# Patient Record
Sex: Male | Born: 1970 | Race: White | Hispanic: No | Marital: Single | Smoking: Never smoker
Health system: Southern US, Community
[De-identification: ages and names within clinical notes are randomized; demographics above are authoritative.]

## PROBLEM LIST (undated history)

## (undated) ENCOUNTER — Ambulatory Visit: Admission: EM | Source: Home / Self Care

## (undated) DIAGNOSIS — Z8719 Personal history of other diseases of the digestive system: Secondary | ICD-10-CM

## (undated) DIAGNOSIS — G8254 Quadriplegia, C5-C7 incomplete: Secondary | ICD-10-CM

## (undated) DIAGNOSIS — I509 Heart failure, unspecified: Secondary | ICD-10-CM

## (undated) DIAGNOSIS — I959 Hypotension, unspecified: Secondary | ICD-10-CM

## (undated) DIAGNOSIS — A499 Bacterial infection, unspecified: Secondary | ICD-10-CM

## (undated) DIAGNOSIS — M6530 Trigger finger, unspecified finger: Secondary | ICD-10-CM

## (undated) DIAGNOSIS — E109 Type 1 diabetes mellitus without complications: Secondary | ICD-10-CM

## (undated) DIAGNOSIS — M1990 Unspecified osteoarthritis, unspecified site: Secondary | ICD-10-CM

## (undated) DIAGNOSIS — Z87440 Personal history of urinary (tract) infections: Secondary | ICD-10-CM

## (undated) DIAGNOSIS — G8929 Other chronic pain: Secondary | ICD-10-CM

## (undated) DIAGNOSIS — Z87442 Personal history of urinary calculi: Secondary | ICD-10-CM

## (undated) DIAGNOSIS — K219 Gastro-esophageal reflux disease without esophagitis: Secondary | ICD-10-CM

## (undated) DIAGNOSIS — N200 Calculus of kidney: Secondary | ICD-10-CM

## (undated) DIAGNOSIS — K603 Anal fistula, unspecified: Secondary | ICD-10-CM

## (undated) DIAGNOSIS — R32 Unspecified urinary incontinence: Secondary | ICD-10-CM

## (undated) DIAGNOSIS — F32A Depression, unspecified: Secondary | ICD-10-CM

## (undated) DIAGNOSIS — F329 Major depressive disorder, single episode, unspecified: Secondary | ICD-10-CM

## (undated) DIAGNOSIS — R55 Syncope and collapse: Secondary | ICD-10-CM

## (undated) DIAGNOSIS — G904 Autonomic dysreflexia: Secondary | ICD-10-CM

## (undated) DIAGNOSIS — N390 Urinary tract infection, site not specified: Secondary | ICD-10-CM

## (undated) HISTORY — DX: Urinary tract infection, site not specified: N39.0

## (undated) HISTORY — DX: Quadriplegia, C5-C7 incomplete: G82.54

## (undated) HISTORY — DX: Depression, unspecified: F32.A

## (undated) HISTORY — DX: Type 1 diabetes mellitus without complications: E10.9

## (undated) HISTORY — DX: Major depressive disorder, single episode, unspecified: F32.9

## (undated) HISTORY — DX: Syncope and collapse: R55

## (undated) HISTORY — PX: KNEE SURGERY: SHX244

## (undated) HISTORY — DX: Personal history of urinary (tract) infections: Z87.440

## (undated) HISTORY — PX: BLADDER SURGERY: SHX569

## (undated) HISTORY — DX: Calculus of kidney: N20.0

## (undated) HISTORY — DX: Other chronic pain: G89.29

## (undated) HISTORY — PX: OTHER SURGICAL HISTORY: SHX169

## (undated) HISTORY — DX: Anal fistula: K60.3

## (undated) HISTORY — DX: Anal fistula, unspecified: K60.30

## (undated) HISTORY — DX: Bacterial infection, unspecified: A49.9

## (undated) HISTORY — DX: Unspecified urinary incontinence: R32

---
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
## 1999-12-08 HISTORY — PX: POPLITEAL SYNOVIAL CYST EXCISION: SUR555

## 2004-11-18 ENCOUNTER — Ambulatory Visit: Payer: Self-pay | Admitting: Pain Medicine

## 2005-02-12 ENCOUNTER — Ambulatory Visit: Payer: Self-pay | Admitting: Pain Medicine

## 2005-02-18 ENCOUNTER — Ambulatory Visit: Payer: Self-pay | Admitting: Pain Medicine

## 2005-03-16 ENCOUNTER — Ambulatory Visit: Payer: Self-pay | Admitting: Physician Assistant

## 2005-05-07 ENCOUNTER — Ambulatory Visit: Payer: Self-pay | Admitting: Pain Medicine

## 2005-07-21 ENCOUNTER — Ambulatory Visit: Payer: Self-pay | Admitting: Pain Medicine

## 2005-08-12 ENCOUNTER — Ambulatory Visit: Payer: Self-pay | Admitting: Urology

## 2007-10-24 ENCOUNTER — Encounter: Payer: Self-pay | Admitting: Internal Medicine

## 2007-11-07 ENCOUNTER — Encounter: Payer: Self-pay | Admitting: Internal Medicine

## 2007-12-08 ENCOUNTER — Encounter: Payer: Self-pay | Admitting: Internal Medicine

## 2008-04-20 ENCOUNTER — Ambulatory Visit: Payer: Self-pay | Admitting: Unknown Physician Specialty

## 2008-05-11 ENCOUNTER — Encounter: Payer: Self-pay | Admitting: Internal Medicine

## 2008-06-06 ENCOUNTER — Encounter: Payer: Self-pay | Admitting: Internal Medicine

## 2008-12-11 ENCOUNTER — Ambulatory Visit: Payer: Self-pay | Admitting: Internal Medicine

## 2008-12-31 ENCOUNTER — Encounter: Payer: Self-pay | Admitting: Internal Medicine

## 2009-01-07 ENCOUNTER — Encounter: Payer: Self-pay | Admitting: Internal Medicine

## 2009-01-10 ENCOUNTER — Ambulatory Visit: Payer: Self-pay | Admitting: Urology

## 2009-02-04 ENCOUNTER — Encounter: Payer: Self-pay | Admitting: Internal Medicine

## 2009-03-06 ENCOUNTER — Encounter: Payer: Self-pay | Admitting: Internal Medicine

## 2009-03-07 ENCOUNTER — Encounter: Payer: Self-pay | Admitting: Internal Medicine

## 2009-06-13 ENCOUNTER — Ambulatory Visit: Payer: Self-pay | Admitting: Urology

## 2010-05-16 ENCOUNTER — Ambulatory Visit: Payer: Self-pay | Admitting: Unknown Physician Specialty

## 2010-06-05 ENCOUNTER — Ambulatory Visit: Payer: Self-pay | Admitting: Urology

## 2010-07-24 ENCOUNTER — Ambulatory Visit: Payer: Self-pay | Admitting: Urology

## 2010-10-27 ENCOUNTER — Ambulatory Visit: Payer: Self-pay | Admitting: Internal Medicine

## 2011-05-25 ENCOUNTER — Ambulatory Visit: Payer: Self-pay | Admitting: Internal Medicine

## 2011-06-01 ENCOUNTER — Ambulatory Visit: Payer: Self-pay | Admitting: Internal Medicine

## 2011-06-07 ENCOUNTER — Ambulatory Visit: Payer: Self-pay | Admitting: Internal Medicine

## 2012-01-25 ENCOUNTER — Ambulatory Visit: Payer: Self-pay | Admitting: Internal Medicine

## 2012-02-09 ENCOUNTER — Ambulatory Visit: Payer: Self-pay | Admitting: Internal Medicine

## 2012-04-05 ENCOUNTER — Ambulatory Visit: Payer: Self-pay | Admitting: Urology

## 2012-09-26 ENCOUNTER — Other Ambulatory Visit: Payer: Self-pay | Admitting: *Deleted

## 2012-09-26 NOTE — Telephone Encounter (Signed)
R'cd fax from [place] for refill of Lantus-sig: 16 units subcutaneously at bedtime-please advise on refill, pt has appointment with you on 11/24/2012 at 4:00pm.

## 2012-09-27 NOTE — Telephone Encounter (Signed)
Ok to refill x 3 (with directions the way he has been taking).  Thanks.

## 2012-09-28 ENCOUNTER — Other Ambulatory Visit: Payer: Self-pay | Admitting: *Deleted

## 2012-09-30 NOTE — Telephone Encounter (Signed)
Patients states that he has already gotten his rx refilled and that he is good until his next appt.

## 2012-11-24 ENCOUNTER — Encounter: Payer: Self-pay | Admitting: Internal Medicine

## 2012-11-24 ENCOUNTER — Ambulatory Visit (INDEPENDENT_AMBULATORY_CARE_PROVIDER_SITE_OTHER): Payer: Commercial Indemnity | Admitting: Internal Medicine

## 2012-11-24 VITALS — BP 128/68 | HR 63 | Temp 98.6°F

## 2012-11-24 DIAGNOSIS — R5381 Other malaise: Secondary | ICD-10-CM

## 2012-11-24 DIAGNOSIS — R55 Syncope and collapse: Secondary | ICD-10-CM

## 2012-11-24 DIAGNOSIS — R532 Functional quadriplegia: Secondary | ICD-10-CM | POA: Insufficient documentation

## 2012-11-24 DIAGNOSIS — R5383 Other fatigue: Secondary | ICD-10-CM

## 2012-11-24 DIAGNOSIS — E109 Type 1 diabetes mellitus without complications: Secondary | ICD-10-CM | POA: Insufficient documentation

## 2012-11-24 DIAGNOSIS — G8220 Paraplegia, unspecified: Secondary | ICD-10-CM

## 2012-11-24 DIAGNOSIS — Z87440 Personal history of urinary (tract) infections: Secondary | ICD-10-CM | POA: Insufficient documentation

## 2012-11-24 DIAGNOSIS — I959 Hypotension, unspecified: Secondary | ICD-10-CM

## 2012-11-27 ENCOUNTER — Encounter: Payer: Self-pay | Admitting: Internal Medicine

## 2012-11-27 DIAGNOSIS — I959 Hypotension, unspecified: Secondary | ICD-10-CM | POA: Insufficient documentation
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
## 2012-11-27 NOTE — Assessment & Plan Note (Signed)
He adjust his own insulin.  Takes 18 units of Lantus in the evening.  Uses Humalog with meals.  Check met b and a1c.

## 2012-11-27 NOTE — Assessment & Plan Note (Signed)
Currently asymptomatic.  Continues follow up with Dr Yves Dill.

## 2012-11-27 NOTE — Assessment & Plan Note (Signed)
Having episodes of decreased blood pressure as outlined.  Had a syncopal episode while going up his ramp to his house.  Multiple other near syncopal episodes and weakness with increased activity.  Have obtained EKG and holter previously.   Obtain records. Discussed at length with him today.  Unclear as to the exact etiology.  Will check cbc, met c, am cortisol.  He has noticed that eating salt helps.  Given risk factors, I do feel he needs further cardiac w/up.  Will refer to cardiology (Dr Rockey Situ) for further evaluation.  Possible autonomic dysfunction.  Until we can obtain further w/up, he was instructed to minimize increased activity and exertion.  Episodes do not occur while he is driving.

## 2012-11-28 ENCOUNTER — Other Ambulatory Visit (INDEPENDENT_AMBULATORY_CARE_PROVIDER_SITE_OTHER): Payer: Commercial Indemnity

## 2012-11-28 DIAGNOSIS — E109 Type 1 diabetes mellitus without complications: Secondary | ICD-10-CM

## 2012-11-28 DIAGNOSIS — R55 Syncope and collapse: Secondary | ICD-10-CM

## 2012-11-28 DIAGNOSIS — R5383 Other fatigue: Secondary | ICD-10-CM

## 2012-11-28 DIAGNOSIS — R5381 Other malaise: Secondary | ICD-10-CM

## 2012-11-28 LAB — LIPID PANEL
Cholesterol: 161 mg/dL (ref 0–200)
HDL: 65.5 mg/dL (ref >39.00)
VLDL: 7.8 mg/dL (ref 0.0–40.0)

## 2012-11-28 LAB — CBC WITH DIFFERENTIAL/PLATELET
Eosinophils Absolute: 0.1 10*3/uL (ref 0.0–0.7)
Eosinophils Relative: 1.7 % (ref 0.0–5.0)
HCT: 37.1 % — ABNORMAL LOW (ref 39.0–52.0)
Lymphs Abs: 1 10*3/uL (ref 0.7–4.0)
MCHC: 34.7 g/dL (ref 30.0–36.0)
MCV: 94.9 fl (ref 78.0–100.0)
Monocytes Absolute: 0.3 10*3/uL (ref 0.1–1.0)
Platelets: 192 10*3/uL (ref 150.0–400.0)
RDW: 12.6 % (ref 11.5–14.6)
WBC: 3.9 10*3/uL — ABNORMAL LOW (ref 4.5–10.5)

## 2012-11-28 LAB — COMPREHENSIVE METABOLIC PANEL
AST: 17 U/L (ref 0–37)
Alkaline Phosphatase: 63 U/L (ref 39–117)
Glucose, Bld: 136 mg/dL — ABNORMAL HIGH (ref 70–99)
Sodium: 131 mEq/L — ABNORMAL LOW (ref 135–145)
Total Bilirubin: 0.8 mg/dL (ref 0.3–1.2)
Total Protein: 6.3 g/dL (ref 6.0–8.3)

## 2012-11-28 LAB — TSH: TSH: 1.27 u[IU]/mL (ref 0.35–5.50)

## 2012-12-15 ENCOUNTER — Ambulatory Visit (INDEPENDENT_AMBULATORY_CARE_PROVIDER_SITE_OTHER): Payer: Commercial Indemnity | Admitting: Cardiovascular Disease

## 2012-12-15 ENCOUNTER — Encounter: Payer: Self-pay | Admitting: Cardiovascular Disease

## 2012-12-15 VITALS — BP 100/70 | HR 76 | Ht 73.0 in

## 2012-12-15 DIAGNOSIS — I959 Hypotension, unspecified: Secondary | ICD-10-CM

## 2012-12-15 DIAGNOSIS — R0602 Shortness of breath: Secondary | ICD-10-CM

## 2012-12-15 DIAGNOSIS — R55 Syncope and collapse: Secondary | ICD-10-CM

## 2012-12-15 NOTE — Patient Instructions (Addendum)
Your physician has requested that you have an echocardiogram 01/03/13 at 12:30 pm Echocardiography is a painless test that uses sound waves to create images of your heart. It provides your doctor with information about the size and shape of your heart and how well your heart's chambers and valves are working. This procedure takes approximately one hour. There are no restrictions for this procedure.  Your physician has requested that you have a lexiscan myoview. For further information please visit [email or web address]. Please follow instruction sheet, as given.  Follow up after tests.   [place]  Your caregiver has ordered a Stress Test with nuclear imaging. The purpose of this test is to evaluate the blood supply to your heart muscle. This procedure is referred to as a "Non-Invasive Stress Test." This is because other than having an IV started in your vein, nothing is inserted or "invades" your body. Cardiac stress tests are done to find areas of poor blood flow to the heart by determining the extent of coronary artery disease (CAD). Some patients exercise on a treadmill, which naturally increases the blood flow to your heart, while others who are  unable to walk on a treadmill due to physical limitations have a pharmacologic/chemical stress agent called Lexiscan . This medicine will mimic walking on a treadmill by temporarily increasing your coronary blood flow.   Please note: these test may take anywhere between 2-4 hours to complete  PLEASE REPORT TO [facility] AT THE FIRST DESK WILL DIRECT YOU WHERE TO GO  Date of Procedure:_______1/28/14______________________________  Arrival Time for Procedure:___7:45 am___________________________  Instructions regarding medication:   ___x_ : Hold diabetes medication morning of procedure  ____:  Hold betablocker(s) night before procedure and morning of procedure  ____:  Hold other medications as  follows:_________________________________________________________________________________________________________________________________________________________________________________________________________________________________________________________________________________________  PLEASE NOTIFY THE OFFICE AT LEAST 24 HOURS IN ADVANCE IF YOU ARE UNABLE TO KEEP YOUR APPOINTMENT.  [phone]  How to prepare for your Myoview test:  1. Do not eat or drink after midnight 2. No caffeine for 24 hours prior to test 3. Your medication may be taken with water.  If your doctor stopped a medication because of this test, do not take that medication. 4. Ladies, please do not wear dresses.  Skirts or pants are appropriate. Please wear a short sleeve shirt. 5. No perfume, cologne or lotion. 6. Wear comfortable walking shoes. No heels!

## 2012-12-15 NOTE — Assessment & Plan Note (Signed)
His reported episode of exertional syncope is concerning for a possible cardiac etiology. Further workup as outlined above. If his cardiac workup is unremarkable, I will consider proceeding with carotid duplex ultrasound.

## 2012-12-15 NOTE — Assessment & Plan Note (Signed)
The patient reports progressive exertional dyspnea and exercise intolerance over the last year. Given his history of diabetes, I think it's important to rule out underlying obstructive coronary artery disease. Thus, I recommend proceeding with a pharmacologic nuclear stress test for evaluation. I will also obtain an echocardiogram to evaluate valvular structure and pulmonary pressure.
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
## 2012-12-15 NOTE — Assessment & Plan Note (Signed)
The patient reports episodes of hypotension. His labs showed only slight anemia which does not explain this. He was noted to be hyponatremic with a sodium of 131. Morning cortisol level was normal. Thyroid function was also normal. We might need to consider a Cortrosyn stimulation test to fully rule out adrenal insufficiency. If his workup is negative and he continues to have symptoms, treatment with Florinef can be considered.

## 2013-01-03 ENCOUNTER — Encounter: Payer: Self-pay | Admitting: Internal Medicine

## 2013-01-03 ENCOUNTER — Ambulatory Visit (INDEPENDENT_AMBULATORY_CARE_PROVIDER_SITE_OTHER): Payer: Managed Care, Other (non HMO) | Admitting: Internal Medicine

## 2013-01-03 ENCOUNTER — Ambulatory Visit: Payer: Self-pay | Admitting: Cardiovascular Disease

## 2013-01-03 ENCOUNTER — Telehealth: Payer: Self-pay

## 2013-01-03 ENCOUNTER — Other Ambulatory Visit (INDEPENDENT_AMBULATORY_CARE_PROVIDER_SITE_OTHER): Payer: Managed Care, Other (non HMO)

## 2013-01-03 ENCOUNTER — Other Ambulatory Visit: Payer: Commercial Indemnity

## 2013-01-03 ENCOUNTER — Other Ambulatory Visit: Payer: Self-pay

## 2013-01-03 VITALS — BP 130/80 | HR 58 | Temp 98.6°F

## 2013-01-03 DIAGNOSIS — R55 Syncope and collapse: Secondary | ICD-10-CM

## 2013-01-03 DIAGNOSIS — R41840 Attention and concentration deficit: Secondary | ICD-10-CM

## 2013-01-03 DIAGNOSIS — R0602 Shortness of breath: Secondary | ICD-10-CM

## 2013-01-03 DIAGNOSIS — I959 Hypotension, unspecified: Secondary | ICD-10-CM

## 2013-01-03 DIAGNOSIS — E109 Type 1 diabetes mellitus without complications: Secondary | ICD-10-CM

## 2013-01-03 DIAGNOSIS — Z87440 Personal history of urinary (tract) infections: Secondary | ICD-10-CM

## 2013-01-03 NOTE — Telephone Encounter (Signed)
"  Call pt and tell him stress test was normal" VO Dr. Mills Koller, RN Pt informed

## 2013-01-04 ENCOUNTER — Encounter: Payer: Self-pay | Admitting: Internal Medicine

## 2013-01-04 NOTE — Assessment & Plan Note (Signed)
Had stress test today.  Negative.  No increased sob currently.  Follow.

## 2013-01-04 NOTE — Assessment & Plan Note (Signed)
Brought in no recorded sugar readings.  Reports sugars varying.  He adjust his insulin on his own.  Instructed on the importance of eating regular meals, appropriate snacks, etc.  Follow.

## 2013-01-04 NOTE — Assessment & Plan Note (Signed)
See last note, cardiology note and above for details.  Undergoing w/up.  May need florinef.  Follow.
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
## 2013-01-04 NOTE — Assessment & Plan Note (Signed)
Currently asymptomatic.  Continues to follow up with urology.

## 2013-01-04 NOTE — Assessment & Plan Note (Signed)
Occurs with increased activity or exertion.  Undergoing w/up currently.  Awaiting carotid US results.  May need Florinef.

## 2013-01-05 ENCOUNTER — Other Ambulatory Visit: Payer: Self-pay

## 2013-01-05 ENCOUNTER — Encounter (INDEPENDENT_AMBULATORY_CARE_PROVIDER_SITE_OTHER): Payer: Managed Care, Other (non HMO)

## 2013-01-05 DIAGNOSIS — R55 Syncope and collapse: Secondary | ICD-10-CM

## 2013-01-05 DIAGNOSIS — R42 Dizziness and giddiness: Secondary | ICD-10-CM

## 2013-01-11 ENCOUNTER — Other Ambulatory Visit: Payer: Self-pay | Admitting: *Deleted

## 2013-01-11 NOTE — Telephone Encounter (Signed)
Refill request  Lantus 100U/ML  # 10 cc  16 unit subcutaneously at bedtime

## 2013-01-12 MED ORDER — INSULIN GLARGINE 100 UNIT/ML ~~LOC~~ SOLN: 18.0000 [IU] | Freq: Every day | SUBCUTANEOUS | Status: DC

## 2013-01-12 NOTE — Telephone Encounter (Signed)
Sent in to pharmacy.  

## 2013-01-16 ENCOUNTER — Telehealth: Payer: Self-pay | Admitting: *Deleted

## 2013-01-16 NOTE — Telephone Encounter (Signed)
Florinef?

## 2013-01-16 NOTE — Telephone Encounter (Signed)
Spoke with pt today to give him his carotid doppler results and pt mentioned that he is still having syncopal spells occasionally and that his BP has been dropping. Most recent BP taken 80/50 HR:N/a and he was in question about Dr. Fletcher Anon adding a medication to help elevate his blood pressure and increase his sodium. Please advise.

## 2013-01-17 ENCOUNTER — Other Ambulatory Visit: Payer: Self-pay

## 2013-01-17 MED ORDER — FLUDROCORTISONE ACETATE 0.1 MG PO TABS: 0.1000 mg | ORAL_TABLET | Freq: Every day | ORAL | Status: DC

## 2013-01-17 NOTE — Telephone Encounter (Signed)
Pt informed Understanding verb New rx sent to pharm

## 2013-01-17 NOTE — Telephone Encounter (Signed)
Start Fludrocortisone 0.1 mg once daily. Follow up with me in 1 month.

## 2013-01-18 ENCOUNTER — Other Ambulatory Visit: Payer: Self-pay | Admitting: *Deleted

## 2013-01-19 MED ORDER — BACLOFEN 20 MG PO TABS: ORAL_TABLET | ORAL | Status: DC

## 2013-01-19 NOTE — Telephone Encounter (Signed)
I am routing these refill request in case patient is out of medication.

## 2013-01-24 ENCOUNTER — Other Ambulatory Visit: Payer: Self-pay | Admitting: *Deleted

## 2013-01-24 DIAGNOSIS — R0602 Shortness of breath: Secondary | ICD-10-CM

## 2013-01-24 DIAGNOSIS — R55 Syncope and collapse: Secondary | ICD-10-CM

## 2013-01-25 ENCOUNTER — Telehealth: Payer: Self-pay | Admitting: *Deleted

## 2013-01-25 ENCOUNTER — Encounter: Payer: Self-pay | Admitting: Internal Medicine

## 2013-01-25 NOTE — Telephone Encounter (Signed)
See below and advise

## 2013-01-25 NOTE — Telephone Encounter (Signed)
Pt has stop taking his fludrocortisone  Due to side effects that he read online. Pt states that he has had severe dry mouth and depression. Wants to know if he can take something else.

## 2013-01-26 ENCOUNTER — Encounter: Payer: Self-pay | Admitting: Internal Medicine

## 2013-01-26 NOTE — Telephone Encounter (Signed)
We can try Midodrine 2.5 mg tid

## 2013-01-27 ENCOUNTER — Telehealth: Payer: Self-pay | Admitting: Internal Medicine

## 2013-01-27 ENCOUNTER — Other Ambulatory Visit: Payer: Self-pay

## 2013-01-27 MED ORDER — MIDODRINE HCL 2.5 MG PO TABS: 2.5000 mg | ORAL_TABLET | Freq: Three times a day (TID) | ORAL | Status: DC

## 2013-01-27 NOTE — Telephone Encounter (Signed)
Pt informed Understanding verb RX sent to pharm

## 2013-01-27 NOTE — Telephone Encounter (Signed)
Please notify pt - rx written and he can come in and pick it up

## 2013-01-30 ENCOUNTER — Telehealth: Payer: Self-pay | Admitting: Internal Medicine

## 2013-01-30 NOTE — Telephone Encounter (Signed)
Called patient's home/cell number, the person who answered said it was the wrong number. There was no answer at work number. Will try again later.

## 2013-01-30 NOTE — Telephone Encounter (Signed)
Do not put prescription in the mail.  He will send someone tomorrow to pick up the prescription.

## 2013-01-30 NOTE — Telephone Encounter (Signed)
Called patient back and left message on voice mail letting patient know that prescription is ready for pick up.

## 2013-01-31 ENCOUNTER — Telehealth: Payer: Self-pay | Admitting: *Deleted

## 2013-01-31 NOTE — Telephone Encounter (Signed)
Called patient's pharmacy to to change directions on his Lantus prescription.

## 2013-02-01 NOTE — Telephone Encounter (Signed)
Prescription picked up.

## 2013-02-06 ENCOUNTER — Encounter: Payer: Self-pay | Admitting: Internal Medicine

## 2013-02-06 ENCOUNTER — Other Ambulatory Visit: Payer: Self-pay | Admitting: Internal Medicine

## 2013-02-06 DIAGNOSIS — R55 Syncope and collapse: Secondary | ICD-10-CM

## 2013-02-06 DIAGNOSIS — G8220 Paraplegia, unspecified: Secondary | ICD-10-CM

## 2013-02-15 ENCOUNTER — Telehealth: Payer: Self-pay | Admitting: Internal Medicine

## 2013-02-15 ENCOUNTER — Ambulatory Visit: Payer: Self-pay | Admitting: Family Medicine

## 2013-02-15 NOTE — Telephone Encounter (Signed)
I can work pt in at lunch on Friday - if he does cannot come in tomorrow to see Raquel.  The only opening I will have will be to work him in during lunch.  If he is unable to do this then - can be seen at acute care this evening.  (I am not in the office this pm)

## 2013-02-15 NOTE — Telephone Encounter (Signed)
Got in touch with patient, see other message.

## 2013-02-15 NOTE — Telephone Encounter (Signed)
Patient stated that he went by acute care after he left work today. Acute care said that it was not shingles and gave him benadryl and some cream for his skin rash. Advised patient to let us know if he has any more problems.

## 2013-02-15 NOTE — Telephone Encounter (Signed)
Patient called about possibly having shingles. I forwarded him to the triage nurse . I did offer him an appointment with the N [facility]P in the morning and he did not want that appointment because of his work schedule . I did not have an afternoon appointment to offer him when he called.

## 2013-02-15 NOTE — Telephone Encounter (Signed)
Returning call, no answer, msg left.

## 2013-03-02 ENCOUNTER — Telehealth: Payer: Self-pay | Admitting: *Deleted

## 2013-03-02 NOTE — Telephone Encounter (Signed)
Dr. Nicolasa Ducking called to let you know that patient is drinking up to 6 beers daily and smoking marijuana. Dr. Nicolasa Ducking stated that she refused to give patient ADHD medication and also other stimulants that he requested. Patient called back to her office and canceled his appointments. Dr. Gates Rigg believes patient is very depressed and that he is in denile. He does not want to talk about how much he drinks and smokes. Dr. Nicolasa Ducking also said that patient has used cocaine and heroine in past. Dr. Nicolasa Ducking said that she would continue to call patient until he agreed to see her again.

## 2013-03-03 NOTE — Telephone Encounter (Signed)
Discussed with nurse.  This was FYI.  Does not expect a call back.

## 2013-03-07 ENCOUNTER — Ambulatory Visit: Payer: Managed Care, Other (non HMO) | Admitting: Cardiovascular Disease

## 2013-04-03 ENCOUNTER — Ambulatory Visit (INDEPENDENT_AMBULATORY_CARE_PROVIDER_SITE_OTHER): Payer: Managed Care, Other (non HMO) | Admitting: Internal Medicine

## 2013-04-03 ENCOUNTER — Encounter: Payer: Self-pay | Admitting: Internal Medicine

## 2013-04-03 VITALS — BP 110/70 | HR 60 | Temp 98.1°F

## 2013-04-03 DIAGNOSIS — Z87440 Personal history of urinary (tract) infections: Secondary | ICD-10-CM

## 2013-04-03 DIAGNOSIS — R0602 Shortness of breath: Secondary | ICD-10-CM

## 2013-04-03 DIAGNOSIS — I959 Hypotension, unspecified: Secondary | ICD-10-CM

## 2013-04-03 DIAGNOSIS — E109 Type 1 diabetes mellitus without complications: Secondary | ICD-10-CM

## 2013-04-03 DIAGNOSIS — G8220 Paraplegia, unspecified: Secondary | ICD-10-CM

## 2013-04-03 DIAGNOSIS — R55 Syncope and collapse: Secondary | ICD-10-CM

## 2013-04-04 ENCOUNTER — Encounter: Payer: Self-pay | Admitting: Internal Medicine

## 2013-04-04 ENCOUNTER — Telehealth: Payer: Self-pay | Admitting: Internal Medicine

## 2013-04-04 NOTE — Assessment & Plan Note (Signed)
Doing better.  Stay hydrated.  Follow.  Desires no further treatment or intervention.  Did not tolerate florinef.

## 2013-04-04 NOTE — Assessment & Plan Note (Signed)
Had stress test.  Negative.  No increased sob now.  Follow.  Breathing stable.

## 2013-04-04 NOTE — Assessment & Plan Note (Signed)
See last note, cardiology note and above for details.  Did not tolerate Florinef.  Never tried Midodrine.  Better.  Desires no furhter w/up or intervention.

## 2013-04-04 NOTE — Assessment & Plan Note (Signed)
Sugars varying as outlined.  He controls his insulin.  Discussed importance of checking his sugars regularly.  He checks his sugars multiple times per day.  Discussed importance of eating regular meals.  He will call back to schedule labs.  Check metabolic panel and A8T.  Discussed the need for an eye exam.

## 2013-04-04 NOTE — Telephone Encounter (Signed)
Needs a follow up appt scheduled in 4 months (4mn).  Needs to be late pm appt.  Thanks.

## 2013-04-04 NOTE — Assessment & Plan Note (Signed)
Currently asymptomatic.  Continues follow up with Dr Yves Dill.  Just completed Levaquin and macrobid.
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
## 2013-04-05 ENCOUNTER — Encounter: Payer: Self-pay | Admitting: Internal Medicine

## 2013-04-06 ENCOUNTER — Encounter: Payer: Self-pay | Admitting: Internal Medicine

## 2013-04-11 NOTE — Telephone Encounter (Signed)
appointment 9/9 pt aware

## 2013-04-14 ENCOUNTER — Other Ambulatory Visit (INDEPENDENT_AMBULATORY_CARE_PROVIDER_SITE_OTHER): Payer: Managed Care, Other (non HMO)

## 2013-04-14 ENCOUNTER — Telehealth: Payer: Self-pay | Admitting: *Deleted

## 2013-04-14 ENCOUNTER — Other Ambulatory Visit: Payer: Managed Care, Other (non HMO) | Admitting: Internal Medicine

## 2013-04-14 DIAGNOSIS — D72819 Decreased white blood cell count, unspecified: Secondary | ICD-10-CM

## 2013-04-14 DIAGNOSIS — E109 Type 1 diabetes mellitus without complications: Secondary | ICD-10-CM

## 2013-04-14 LAB — CBC WITH DIFFERENTIAL/PLATELET
Basophils Relative: 0.6 % (ref 0.0–3.0)
Eosinophils Absolute: 0.1 10*3/uL (ref 0.0–0.7)
HCT: 36.1 % — ABNORMAL LOW (ref 39.0–52.0)
Hemoglobin: 12.8 g/dL — ABNORMAL LOW (ref 13.0–17.0)
Lymphocytes Relative: 32.9 % (ref 12.0–46.0)
Lymphs Abs: 1.2 10*3/uL (ref 0.7–4.0)
MCHC: 35.5 g/dL (ref 30.0–36.0)
Monocytes Relative: 7.5 % (ref 3.0–12.0)
Neutro Abs: 2.2 10*3/uL (ref 1.4–7.7)
RBC: 3.87 Mil/uL — ABNORMAL LOW (ref 4.22–5.81)

## 2013-04-14 LAB — BASIC METABOLIC PANEL
Chloride: 98 mEq/L (ref 96–112)
GFR: 130.77 mL/min (ref >60.00)
Glucose, Bld: 51 mg/dL — ABNORMAL LOW (ref 70–99)
Potassium: 4.6 mEq/L (ref 3.5–5.1)
Sodium: 132 mEq/L — ABNORMAL LOW (ref 135–145)

## 2013-04-14 NOTE — Telephone Encounter (Signed)
What labs and dx for this pt?

## 2013-04-14 NOTE — Telephone Encounter (Signed)
Orders placed for labs (met b, a1c and cbc)

## 2013-04-19 ENCOUNTER — Other Ambulatory Visit: Payer: Self-pay | Admitting: *Deleted

## 2013-04-20 ENCOUNTER — Other Ambulatory Visit: Payer: Self-pay | Admitting: *Deleted

## 2013-04-20 MED ORDER — BACLOFEN 20 MG PO TABS: ORAL_TABLET | ORAL | Status: DC

## 2013-04-20 NOTE — Telephone Encounter (Signed)
Okay to refill? 

## 2013-05-17 ENCOUNTER — Encounter: Payer: Self-pay | Admitting: Internal Medicine

## 2013-05-19 ENCOUNTER — Encounter: Payer: Self-pay | Admitting: Internal Medicine

## 2013-05-19 ENCOUNTER — Ambulatory Visit (INDEPENDENT_AMBULATORY_CARE_PROVIDER_SITE_OTHER): Payer: Managed Care, Other (non HMO) | Admitting: Internal Medicine

## 2013-05-19 VITALS — BP 108/60 | HR 68 | Temp 98.2°F | Resp 18

## 2013-05-19 DIAGNOSIS — M25512 Pain in left shoulder: Secondary | ICD-10-CM

## 2013-05-19 DIAGNOSIS — E109 Type 1 diabetes mellitus without complications: Secondary | ICD-10-CM

## 2013-05-19 DIAGNOSIS — M25519 Pain in unspecified shoulder: Secondary | ICD-10-CM

## 2013-05-19 DIAGNOSIS — R609 Edema, unspecified: Secondary | ICD-10-CM

## 2013-05-19 DIAGNOSIS — I959 Hypotension, unspecified: Secondary | ICD-10-CM

## 2013-05-19 LAB — HEPATIC FUNCTION PANEL
ALT: 17 U/L (ref 0–53)
AST: 18 U/L (ref 0–37)
Alkaline Phosphatase: 60 U/L (ref 39–117)
Bilirubin, Direct: 0.2 mg/dL (ref 0.0–0.3)
Total Bilirubin: 0.8 mg/dL (ref 0.3–1.2)
Total Protein: 5.6 g/dL — ABNORMAL LOW (ref 6.0–8.3)

## 2013-05-19 LAB — BASIC METABOLIC PANEL
CO2: 30 mEq/L (ref 19–32)
Chloride: 97 mEq/L (ref 96–112)
Creatinine, Ser: 0.7 mg/dL (ref 0.4–1.5)
Potassium: 4.7 mEq/L (ref 3.5–5.1)

## 2013-05-19 MED ORDER — ETODOLAC 400 MG PO TABS: 400.0000 mg | ORAL_TABLET | Freq: Two times a day (BID) | ORAL | Status: DC

## 2013-05-21 ENCOUNTER — Encounter: Payer: Self-pay | Admitting: Internal Medicine

## 2013-05-21 DIAGNOSIS — R609 Edema, unspecified: Secondary | ICD-10-CM | POA: Insufficient documentation
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
## 2013-05-21 NOTE — Assessment & Plan Note (Signed)
He checks his sugars multiple times per day.  Discussed importance of eating regular meals.  Follow metabolic panel and C0P.  Have discussed the need for an eye exam.

## 2013-05-21 NOTE — Assessment & Plan Note (Signed)
Reports the lower extremity edema as outlined.  Better today.  Better in the am.  Decrease the amount of ibuprofen.  Elevate legs when able.  Support hose.  Follow.

## 2013-05-21 NOTE — Assessment & Plan Note (Signed)
Does not report this as a problem now.  Follow.

## 2013-05-22 ENCOUNTER — Encounter: Payer: Self-pay | Admitting: Emergency Medicine

## 2013-05-26 ENCOUNTER — Encounter: Payer: Self-pay | Admitting: Internal Medicine

## 2013-05-27 MED ORDER — ETODOLAC 500 MG PO TABS: 500.0000 mg | ORAL_TABLET | Freq: Two times a day (BID) | ORAL | Status: DC | PRN

## 2013-05-27 NOTE — Telephone Encounter (Signed)
Replied to my chart message.  rx sent in for Lodine 544m bid prn #[id number] with no refills - to get him through to appt with Dr KJefm Bryant

## 2013-06-19 ENCOUNTER — Other Ambulatory Visit: Payer: Self-pay | Admitting: *Deleted

## 2013-06-19 MED ORDER — BACLOFEN 20 MG PO TABS: ORAL_TABLET | ORAL | Status: DC

## 2013-07-10 ENCOUNTER — Other Ambulatory Visit: Payer: Self-pay | Admitting: *Deleted

## 2013-07-10 MED ORDER — INSULIN ASPART 100 UNIT/ML ~~LOC~~ SOLN: SUBCUTANEOUS | Status: DC

## 2013-07-12 ENCOUNTER — Encounter: Payer: Self-pay | Admitting: *Deleted

## 2013-07-31 ENCOUNTER — Other Ambulatory Visit: Payer: Self-pay | Admitting: Internal Medicine

## 2013-07-31 ENCOUNTER — Encounter: Payer: Self-pay | Admitting: Internal Medicine

## 2013-07-31 DIAGNOSIS — R197 Diarrhea, unspecified: Secondary | ICD-10-CM

## 2013-07-31 NOTE — Progress Notes (Signed)
Order placed for gastro referral

## 2013-08-01 ENCOUNTER — Encounter: Payer: Self-pay | Admitting: *Deleted

## 2013-08-15 ENCOUNTER — Ambulatory Visit (INDEPENDENT_AMBULATORY_CARE_PROVIDER_SITE_OTHER): Payer: Managed Care, Other (non HMO) | Admitting: Internal Medicine

## 2013-08-15 ENCOUNTER — Encounter: Payer: Self-pay | Admitting: Internal Medicine

## 2013-08-15 VITALS — BP 130/90 | HR 57 | Temp 98.5°F

## 2013-08-15 DIAGNOSIS — D649 Anemia, unspecified: Secondary | ICD-10-CM

## 2013-08-15 DIAGNOSIS — G8220 Paraplegia, unspecified: Secondary | ICD-10-CM

## 2013-08-15 DIAGNOSIS — Z87440 Personal history of urinary (tract) infections: Secondary | ICD-10-CM

## 2013-08-15 DIAGNOSIS — E119 Type 2 diabetes mellitus without complications: Secondary | ICD-10-CM

## 2013-08-15 DIAGNOSIS — R198 Other specified symptoms and signs involving the digestive system and abdomen: Secondary | ICD-10-CM

## 2013-08-15 DIAGNOSIS — E109 Type 1 diabetes mellitus without complications: Secondary | ICD-10-CM

## 2013-08-15 DIAGNOSIS — R609 Edema, unspecified: Secondary | ICD-10-CM

## 2013-08-15 DIAGNOSIS — R55 Syncope and collapse: Secondary | ICD-10-CM

## 2013-08-15 DIAGNOSIS — F1010 Alcohol abuse, uncomplicated: Secondary | ICD-10-CM

## 2013-08-15 DIAGNOSIS — R0602 Shortness of breath: Secondary | ICD-10-CM

## 2013-08-15 DIAGNOSIS — I959 Hypotension, unspecified: Secondary | ICD-10-CM

## 2013-08-15 LAB — CBC
HCT: 35.8 % — ABNORMAL LOW (ref 39.0–52.0)
Hemoglobin: 12.4 g/dL — ABNORMAL LOW (ref 13.0–17.0)
WBC: 5.2 10*3/uL (ref 4.0–10.5)

## 2013-08-16 ENCOUNTER — Encounter: Payer: Self-pay | Admitting: Internal Medicine

## 2013-08-16 LAB — HEMOGLOBIN A1C: Mean Plasma Glucose: 166 mg/dL — ABNORMAL HIGH (ref <117)

## 2013-08-16 LAB — COMPREHENSIVE METABOLIC PANEL
BUN: 9 mg/dL (ref 6–23)
CO2: 31 mEq/L (ref 19–32)
Calcium: 8.9 mg/dL (ref 8.4–10.5)
Chloride: 98 mEq/L (ref 96–112)
Creat: 0.57 mg/dL (ref 0.50–1.35)

## 2013-08-16 LAB — IBC PANEL
TIBC: 302 ug/dL (ref 215–435)
UIBC: 196 ug/dL (ref 125–400)

## 2013-08-20 ENCOUNTER — Encounter: Payer: Self-pay | Admitting: Internal Medicine

## 2013-08-20 DIAGNOSIS — D649 Anemia, unspecified: Secondary | ICD-10-CM | POA: Insufficient documentation

## 2013-08-20 DIAGNOSIS — R198 Other specified symptoms and signs involving the digestive system and abdomen: Secondary | ICD-10-CM | POA: Insufficient documentation

## 2013-08-20 DIAGNOSIS — F1010 Alcohol abuse, uncomplicated: Secondary | ICD-10-CM | POA: Insufficient documentation

## 2013-08-20 DIAGNOSIS — F1020 Alcohol dependence, uncomplicated: Secondary | ICD-10-CM | POA: Insufficient documentation

## 2013-08-20 NOTE — Assessment & Plan Note (Signed)
Not an issue now.  Follow.

## 2013-08-20 NOTE — Assessment & Plan Note (Signed)
See previous notes and cardiology notes for details.  Did not tolerate Florinef.  Never tried Midodrine.  Better.  Desires no furhter w/up or intervention.

## 2013-08-20 NOTE — Assessment & Plan Note (Signed)
Discussed the need to cut back on alcohol intake.  Follow.
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
## 2013-08-20 NOTE — Assessment & Plan Note (Signed)
Had stress test.  Negative.  No increased sob now.  Follow.  Breathing stable.
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
## 2013-08-20 NOTE — Assessment & Plan Note (Signed)
Doing better.  Stay hydrated.  Follow.  Desires no further treatment or intervention.  Did not tolerate florinef.

## 2013-08-20 NOTE — Assessment & Plan Note (Signed)
GI w/up as outlined.  Recheck cbc.  Also check iron studies and B12.

## 2013-08-20 NOTE — Assessment & Plan Note (Signed)
Saw GI.  Planning for colonoscopy next week.  Further w/up pending results.

## 2013-08-20 NOTE — Assessment & Plan Note (Signed)
He checks his sugars multiple times per day.  Discussed importance of eating regular meals.  Follow metabolic panel and M7J.  Have discussed the need for an eye exam.  Check met b and a1c.

## 2013-08-20 NOTE — Assessment & Plan Note (Signed)
Currently asymptomatic.  Continues follow up with Dr Yves Dill.  On macrobid now.  Continue to follow up with urology.

## 2013-08-22 ENCOUNTER — Ambulatory Visit: Payer: Self-pay | Admitting: Gastroenterology

## 2013-08-23 LAB — PATHOLOGY REPORT

## 2013-09-05 ENCOUNTER — Ambulatory Visit: Payer: Self-pay | Admitting: Urology

## 2013-09-06 HISTORY — PX: COLONOSCOPY: SHX174

## 2013-09-18 ENCOUNTER — Other Ambulatory Visit: Payer: Self-pay | Admitting: *Deleted

## 2013-09-19 MED ORDER — BACLOFEN 20 MG PO TABS: ORAL_TABLET | ORAL | Status: DC

## 2013-09-25 ENCOUNTER — Encounter: Payer: Self-pay | Admitting: Internal Medicine

## 2013-10-08 ENCOUNTER — Telehealth: Payer: Self-pay | Admitting: Internal Medicine

## 2013-10-08 NOTE — Telephone Encounter (Signed)
Message sent to notify him we received written notification of authorization for his wheelchair.

## 2013-10-10 ENCOUNTER — Encounter: Payer: Self-pay | Admitting: Internal Medicine

## 2013-10-10 NOTE — Telephone Encounter (Signed)
Mailed unread message to pt  

## 2013-10-12 ENCOUNTER — Other Ambulatory Visit: Payer: Self-pay

## 2013-10-17 ENCOUNTER — Encounter: Payer: Self-pay | Admitting: Cardiothoracic Surgery

## 2013-11-07 ENCOUNTER — Encounter: Payer: Self-pay | Admitting: Internal Medicine

## 2013-11-07 DIAGNOSIS — K649 Unspecified hemorrhoids: Secondary | ICD-10-CM

## 2013-11-07 NOTE — Telephone Encounter (Signed)
Referral made to Dr Bary Castilla for hemorrhoids

## 2013-11-08 ENCOUNTER — Encounter: Payer: Self-pay | Admitting: *Deleted

## 2013-11-08 ENCOUNTER — Encounter: Payer: Self-pay | Admitting: Emergency Medicine

## 2013-11-15 ENCOUNTER — Other Ambulatory Visit: Payer: Self-pay | Admitting: Internal Medicine

## 2013-11-15 ENCOUNTER — Encounter: Payer: Self-pay | Admitting: Internal Medicine

## 2013-11-15 DIAGNOSIS — G909 Disorder of the autonomic nervous system, unspecified: Secondary | ICD-10-CM

## 2013-11-15 NOTE — Progress Notes (Signed)
Order placed for neurology referral.

## 2013-11-23 ENCOUNTER — Other Ambulatory Visit: Payer: Self-pay | Admitting: *Deleted

## 2013-11-23 MED ORDER — INSULIN ASPART 100 UNIT/ML ~~LOC~~ SOLN: SUBCUTANEOUS | Status: DC

## 2013-12-12 ENCOUNTER — Ambulatory Visit: Payer: Self-pay | Admitting: General Surgery

## 2013-12-14 ENCOUNTER — Ambulatory Visit (INDEPENDENT_AMBULATORY_CARE_PROVIDER_SITE_OTHER): Payer: Managed Care, Other (non HMO) | Admitting: General Surgery

## 2013-12-14 ENCOUNTER — Encounter: Payer: Self-pay | Admitting: General Surgery

## 2013-12-14 VITALS — BP 116/82 | HR 72 | Resp 14 | Ht 72.0 in | Wt 170.0 lb

## 2013-12-14 DIAGNOSIS — K649 Unspecified hemorrhoids: Secondary | ICD-10-CM

## 2013-12-14 DIAGNOSIS — K625 Hemorrhage of anus and rectum: Secondary | ICD-10-CM
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
## 2013-12-14 NOTE — Patient Instructions (Signed)
The patient is aware to call back for any questions or concern.

## 2013-12-15 DIAGNOSIS — K625 Hemorrhage of anus and rectum: Secondary | ICD-10-CM | POA: Insufficient documentation

## 2013-12-15 DIAGNOSIS — K649 Unspecified hemorrhoids: Secondary | ICD-10-CM | POA: Insufficient documentation

## 2013-12-18 ENCOUNTER — Emergency Department: Payer: Self-pay | Admitting: Emergency Medicine

## 2013-12-18 ENCOUNTER — Ambulatory Visit (INDEPENDENT_AMBULATORY_CARE_PROVIDER_SITE_OTHER): Payer: Managed Care, Other (non HMO) | Admitting: Internal Medicine

## 2013-12-18 ENCOUNTER — Encounter: Payer: Self-pay | Admitting: Internal Medicine

## 2013-12-18 VITALS — BP 130/100 | HR 64 | Temp 98.1°F

## 2013-12-18 DIAGNOSIS — G8220 Paraplegia, unspecified: Secondary | ICD-10-CM

## 2013-12-18 DIAGNOSIS — E109 Type 1 diabetes mellitus without complications: Secondary | ICD-10-CM

## 2013-12-18 DIAGNOSIS — R109 Unspecified abdominal pain: Secondary | ICD-10-CM

## 2013-12-18 DIAGNOSIS — F1010 Alcohol abuse, uncomplicated: Secondary | ICD-10-CM

## 2013-12-18 LAB — URINALYSIS, COMPLETE
Bilirubin,UR: NEGATIVE
Blood: NEGATIVE
Glucose,UR: 150 mg/dL (ref 0–75)
Ketone: NEGATIVE
LEUKOCYTE ESTERASE: NEGATIVE
NITRITE: NEGATIVE
Ph: 6 (ref 4.5–8.0)
Protein: NEGATIVE
Specific Gravity: 1.006 (ref 1.003–1.030)
Squamous Epithelial: NONE SEEN
WBC UR: 1 /HPF (ref 0–5)

## 2013-12-18 LAB — COMPREHENSIVE METABOLIC PANEL
ANION GAP: 5 — AB (ref 7–16)
Albumin: 3.7 g/dL (ref 3.4–5.0)
Alkaline Phosphatase: 88 U/L
BILIRUBIN TOTAL: 0.7 mg/dL (ref 0.2–1.0)
BUN: 12 mg/dL (ref 7–18)
Calcium, Total: 8.9 mg/dL (ref 8.5–10.1)
Chloride: 99 mmol/L (ref 98–107)
Co2: 28 mmol/L (ref 21–32)
Creatinine: 0.64 mg/dL (ref 0.60–1.30)
EGFR (African American): >60
EGFR (Non-African Amer.): >60
GLUCOSE: 225 mg/dL — AB (ref 65–99)
OSMOLALITY: 271 (ref 275–301)
POTASSIUM: 4 mmol/L (ref 3.5–5.1)
SGOT(AST): 19 U/L (ref 15–37)
SGPT (ALT): 21 U/L (ref 12–78)
Sodium: 132 mmol/L — ABNORMAL LOW (ref 136–145)
TOTAL PROTEIN: 6.9 g/dL (ref 6.4–8.2)

## 2013-12-18 LAB — CBC
HCT: 37.5 % — AB (ref 40.0–52.0)
HGB: 13.3 g/dL (ref 13.0–18.0)
MCH: 32.9 pg (ref 26.0–34.0)
MCHC: 35.4 g/dL (ref 32.0–36.0)
MCV: 93 fL (ref 80–100)
PLATELETS: 215 10*3/uL (ref 150–440)
RBC: 4.04 10*6/uL — AB (ref 4.40–5.90)
RDW: 12.5 % (ref 11.5–14.5)
WBC: 6.7 10*3/uL (ref 3.8–10.6)

## 2013-12-18 LAB — LIPASE, BLOOD: LIPASE: 275 U/L (ref 73–393)

## 2013-12-18 NOTE — Progress Notes (Signed)
Pre-visit discussion using our clinic review tool. No additional management support is needed unless otherwise documented below in the visit note.  

## 2013-12-19 ENCOUNTER — Other Ambulatory Visit: Payer: Self-pay | Admitting: *Deleted

## 2013-12-19 ENCOUNTER — Telehealth: Payer: Self-pay | Admitting: Internal Medicine

## 2013-12-19 NOTE — Telephone Encounter (Signed)
The patient was seen in the ER yesterday and the physician wanted him to follow up with his primary today . I don't have any available slots . Please advise

## 2013-12-19 NOTE — Telephone Encounter (Signed)
Spoke with [name], he reports that sx's are about the same, was worse this morning. BP has gone down (140/85-90). No medications were given in the ER.

## 2013-12-19 NOTE — Telephone Encounter (Signed)
Pt coming in tomorrow at 12:00

## 2013-12-19 NOTE — Telephone Encounter (Signed)
Reviewed note and reviewed ER records.  I am not sure etiology.  I can see him tomorrow during lunch for f/u.  May be GI origin.

## 2013-12-19 NOTE — Telephone Encounter (Signed)
Please advise 

## 2013-12-19 NOTE — Telephone Encounter (Signed)
Need records asap.  Please also talk to pt and find out what they did and did they give him any medication, etc.

## 2013-12-20 ENCOUNTER — Encounter: Payer: Self-pay | Admitting: Internal Medicine

## 2013-12-20 ENCOUNTER — Ambulatory Visit (INDEPENDENT_AMBULATORY_CARE_PROVIDER_SITE_OTHER): Payer: Managed Care, Other (non HMO) | Admitting: Internal Medicine

## 2013-12-20 VITALS — BP 100/70 | HR 70 | Temp 97.5°F | Resp 18

## 2013-12-20 DIAGNOSIS — R109 Unspecified abdominal pain: Secondary | ICD-10-CM

## 2013-12-20 DIAGNOSIS — I959 Hypotension, unspecified: Secondary | ICD-10-CM

## 2013-12-20 DIAGNOSIS — G8220 Paraplegia, unspecified: Secondary | ICD-10-CM

## 2013-12-20 DIAGNOSIS — R198 Other specified symptoms and signs involving the digestive system and abdomen: Secondary | ICD-10-CM

## 2013-12-20 DIAGNOSIS — Z87440 Personal history of urinary (tract) infections: Secondary | ICD-10-CM

## 2013-12-20 DIAGNOSIS — E109 Type 1 diabetes mellitus without complications: Secondary | ICD-10-CM

## 2013-12-20 LAB — GLUCOSE, POCT (MANUAL RESULT ENTRY): POC GLUCOSE: 143 mg/dL — AB (ref 70–99)

## 2013-12-20 MED ORDER — BACLOFEN 20 MG PO TABS: ORAL_TABLET | ORAL | Status: DC

## 2013-12-20 NOTE — Progress Notes (Signed)
Pre-visit discussion using our clinic review tool. No additional management support is needed unless otherwise documented below in the visit note.  
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
## 2013-12-20 NOTE — Assessment & Plan Note (Signed)
Discussed the need to cut back on alcohol intake.  Follow.

## 2013-12-20 NOTE — Assessment & Plan Note (Signed)
He checks his sugars multiple times per day.   Follow metabolic panel and P3W.  Have discussed the need for an eye exam.  Check met b and a1c.

## 2013-12-20 NOTE — Telephone Encounter (Signed)
Refill

## 2013-12-20 NOTE — Telephone Encounter (Signed)
Refilled #180 with 2 refills.

## 2013-12-20 NOTE — Assessment & Plan Note (Signed)
Increased discomfort.  Increased sweating.  Blood pressure elevated.  Feels need to rule out more acute abdomen.  Discussed at length with him today.  Will refer to ER for further evaluation and testing.  Feel he needs labs and scanning.  Pt agreeable with this plan.  ER notified.
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
## 2013-12-24 ENCOUNTER — Encounter: Payer: Self-pay | Admitting: Internal Medicine

## 2013-12-24 NOTE — Assessment & Plan Note (Signed)
He checks his sugars multiple times per day.   Follow metabolic panel and Z9J.  Have discussed the need for an eye exam.  Follow met b and a1c.

## 2013-12-24 NOTE — Assessment & Plan Note (Signed)
Recent urinalysis did not appear to be c/w a uti.
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
## 2013-12-24 NOTE — Assessment & Plan Note (Signed)
Saw GI.  Is up to date with colonoscopies.  Concern that the current problems with abdominal discomfort is related to a functional issue with his bowel.  Had a good movement yesterday.  Had some sweating and blood pressure variation.  Refer back to GI for further evaluation and treatment.

## 2013-12-24 NOTE — Assessment & Plan Note (Signed)
Doing better.  Stay hydrated.  Follow.
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
## 2013-12-24 NOTE — Assessment & Plan Note (Signed)
Increased discomfort.  Increased sweating.  Blood pressure variations.  CT as outlined.  Refer back to GI for evaluation and further treatment options.

## 2013-12-29 ENCOUNTER — Telehealth: Payer: Self-pay | Admitting: *Deleted

## 2013-12-29 NOTE — Telephone Encounter (Signed)
FYIJanett [name] with Dr. Manuella Ghazi office called to report that they scheduled him for a MRI C-Spine & a follow-up. After calling patient back with MRI approval, patient decided to cancel both the MRI & follow-up appt.

## 2013-12-29 NOTE — Telephone Encounter (Signed)
Notified to f/u with neurology via my chart.

## 2014-01-16 ENCOUNTER — Other Ambulatory Visit: Payer: Self-pay | Admitting: *Deleted

## 2014-01-16 MED ORDER — INSULIN GLARGINE 100 UNIT/ML ~~LOC~~ SOLN: 21.0000 [IU] | Freq: Every day | SUBCUTANEOUS | Status: DC

## 2014-02-22 ENCOUNTER — Ambulatory Visit: Payer: Self-pay | Admitting: Neurology

## 2014-03-22 ENCOUNTER — Encounter: Payer: Self-pay | Admitting: Internal Medicine

## 2014-03-22 ENCOUNTER — Encounter: Payer: Self-pay | Admitting: Adult Health

## 2014-03-22 ENCOUNTER — Ambulatory Visit (INDEPENDENT_AMBULATORY_CARE_PROVIDER_SITE_OTHER): Payer: Managed Care, Other (non HMO) | Admitting: Adult Health

## 2014-03-22 VITALS — BP 104/72 | HR 63 | Temp 98.5°F | Resp 14 | Wt 170.0 lb

## 2014-03-22 DIAGNOSIS — R059 Cough, unspecified: Secondary | ICD-10-CM

## 2014-03-22 DIAGNOSIS — J029 Acute pharyngitis, unspecified: Secondary | ICD-10-CM

## 2014-03-22 DIAGNOSIS — R05 Cough: Secondary | ICD-10-CM

## 2014-03-22 MED ORDER — LEVOFLOXACIN 500 MG PO TABS: 500.0000 mg | ORAL_TABLET | Freq: Every day | ORAL | Status: DC

## 2014-03-22 NOTE — Progress Notes (Signed)
Pre visit review using our clinic review tool, if applicable. No additional management support is needed unless otherwise documented below in the visit note. 

## 2014-03-22 NOTE — Progress Notes (Signed)
   Subjective:    Patient ID: [name], male    DOB: 10-23-1971, 43 y.o.   MRN: [id number]  HPI  Pt is a pleasant 43 y/o male who presents to clinic with sore throat, cough, congestion. He has been coughing up thick sputum. Symptoms started with a dry cough and have progressed to sinus congestion, rhinorrhea and the remaining. Some upper body discomfort. No fever at present. No shortness of breath reported.   Past Medical History  Diagnosis Date  . Paraplegia     C7 s/p cervical fusion (secondary to [place])  . History of frequent urinary tract infections     neurogenic bladder  . Nephrolithiasis   . Chronic pain     secondary 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  . Depression   . Fainting episodes   . Urine incontinence   . Urinary tract bacterial infections   . Type 1 diabetes mellitus     type 1     Review of Systems  Constitutional: Negative for fever and chills.  HENT: Positive for congestion, postnasal drip, rhinorrhea, sinus pressure and sore throat.   Respiratory: Positive for cough. Negative for shortness of breath and wheezing.   Gastrointestinal: Negative.   Musculoskeletal:       Upper body discomfort - shoulders, neck. Good ROM  All other systems reviewed and are negative.      Objective:   Physical Exam  Constitutional: He is oriented to person, place, and time. He appears well-developed and well-nourished. No distress.  HENT:  Head: Normocephalic and atraumatic.  Mouth/Throat: No oropharyngeal exudate.  Pharyngeal erythema without exudate  Eyes: Conjunctivae and EOM are normal.  Neck: Normal range of motion. Neck supple.  Cardiovascular: Normal rate, regular rhythm and normal heart sounds.  Exam reveals no gallop and no friction rub.   No murmur heard. Pulmonary/Chest: Effort normal and breath sounds normal. No respiratory distress. He has no wheezes. He has no rales.  Musculoskeletal: Normal range of motion.  Lymphadenopathy:    He has no  cervical adenopathy.  Neurological: He is alert and oriented to person, place, and time.  Paraplegia - wheelchair  Skin: Skin is warm and dry.  Psychiatric: He has a normal mood and affect. His behavior is normal. Judgment and thought content normal.      Assessment & Plan:   1. Sore throat Reports sore throat severe started last night. Rapid strep is negative.  2. Cough Congested, productive cough. Has not taken anything over the counter. Sinus congestion with drainage.  Levaquin 500 x 7 days. Fluids to maintain hydration. Cough and deep breathe exercises ~ q2h  Delsym with guaifenesin and dextromethorphan for cough RTC if no improvement within 4-5 days or sooner if necessary.

## 2014-03-22 NOTE — Patient Instructions (Signed)
  Start Levaquin 500 mg for 7 days.  Drink fluids to stay hydrated.  You can take Delsym with guaifenesin and dextromethorphan for your cough.  Call if symptoms do not improve within 4-5 days or sooner if necessary.

## 2014-03-27 ENCOUNTER — Encounter: Payer: Self-pay | Admitting: *Deleted

## 2014-03-29 NOTE — Telephone Encounter (Signed)
Mailed unread mychart message

## 2014-04-19 ENCOUNTER — Other Ambulatory Visit: Payer: Self-pay | Admitting: *Deleted

## 2014-04-19 MED ORDER — BACLOFEN 20 MG PO TABS: ORAL_TABLET | ORAL | Status: DC

## 2014-04-19 NOTE — Telephone Encounter (Signed)
Refilled baclofen #180 with 2 refills.

## 2014-04-19 NOTE — Telephone Encounter (Signed)
Ok refill? 

## 2014-06-04 ENCOUNTER — Other Ambulatory Visit: Payer: Self-pay | Admitting: *Deleted

## 2014-06-04 MED ORDER — INSULIN ASPART 100 UNIT/ML ~~LOC~~ SOLN: SUBCUTANEOUS | Status: DC

## 2014-07-18 ENCOUNTER — Encounter: Payer: Self-pay | Admitting: Internal Medicine

## 2014-07-18 DIAGNOSIS — R197 Diarrhea, unspecified: Secondary | ICD-10-CM

## 2014-07-18 NOTE — Telephone Encounter (Signed)
See pts my chart message.  Order placed for referral to an allergist.

## 2014-08-20 ENCOUNTER — Other Ambulatory Visit: Payer: Self-pay | Admitting: *Deleted

## 2014-08-20 ENCOUNTER — Telehealth: Payer: Self-pay | Admitting: Internal Medicine

## 2014-08-20 MED ORDER — BACLOFEN 20 MG PO TABS: ORAL_TABLET | ORAL | Status: DC

## 2014-08-20 NOTE — Telephone Encounter (Signed)
error 

## 2014-08-20 NOTE — Telephone Encounter (Signed)
Ok refill? 

## 2014-08-20 NOTE — Telephone Encounter (Signed)
Refilled baclofen #180 with 2 refills.

## 2014-08-20 NOTE — Telephone Encounter (Signed)
Needing refill sent to the pharmacy today

## 2014-09-10 ENCOUNTER — Ambulatory Visit (INDEPENDENT_AMBULATORY_CARE_PROVIDER_SITE_OTHER): Payer: Managed Care, Other (non HMO) | Admitting: Internal Medicine

## 2014-09-10 ENCOUNTER — Other Ambulatory Visit: Payer: Managed Care, Other (non HMO)

## 2014-09-10 ENCOUNTER — Encounter: Payer: Self-pay | Admitting: Internal Medicine

## 2014-09-10 VITALS — BP 108/60 | HR 63

## 2014-09-10 DIAGNOSIS — Z91018 Allergy to other foods: Secondary | ICD-10-CM

## 2014-09-10 DIAGNOSIS — G8220 Paraplegia, unspecified: Secondary | ICD-10-CM
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
## 2014-09-10 NOTE — Assessment & Plan Note (Signed)
He questions possible allergy to Lamb/meatballs based on limited occurrence. Long intermittent history of constipation, loose bowels, gas, bloating and cramping with no definite specific mechanism found. Plan- labs for celiac panel, alpha-gal, Food IgE profile

## 2014-09-10 NOTE — Patient Instructions (Signed)
Order- lab Celiac panel, alpha-gal, Food IgE allergy panel     Dx food allergy

## 2014-09-11 LAB — GLIADIN ANTIBODIES, SERUM
Gliadin IgA: 18.8 U/mL (ref <20)
Gliadin IgG: 18.6 U/mL (ref <20)

## 2014-09-11 LAB — ALLERGEN FOOD PROFILE SPECIFIC IGE
Apple: <0.1 kU/L
Chicken IgE: <0.1 kU/L
Corn: <0.1 kU/L
IgE (Immunoglobulin E), Serum: 33 kU/L (ref <115)
Milk IgE: <0.1 kU/L
Soybean IgE: <0.1 kU/L
Tomato IgE: <0.1 kU/L
Tuna IgE: <0.1 kU/L

## 2014-09-11 LAB — RETICULIN ANTIBODIES, IGA W TITER: RETICULIN AB, IGA: NEGATIVE

## 2014-09-11 LAB — TISSUE TRANSGLUTAMINASE, IGA: TISSUE TRANSGLUTAMINASE AB, IGA: 20.2 U/mL — AB (ref <20)

## 2014-09-13 LAB — GALACTOSE-ALPHA-1,3-GALACTOSE IGE: Galactose-alpha-1,3-galactose IgE: <0.1 kU/L (ref <0.35)

## 2014-09-17 ENCOUNTER — Telehealth: Payer: Self-pay | Admitting: Internal Medicine

## 2014-09-17 NOTE — Telephone Encounter (Signed)
Result Note     Food allergy profiles- All the markers for common food allergy were negative/ normal, including the special one checking for allergy to certain meats. The panel of tests for Celiac disease was normal, with one single test being just borderline elevated. This is negative for celiac disease.     I don't have an allergy basis for GI symptoms. Sorry I couldn't help more.    Spoke with patient-aware of results-pt will reach out to his PCP or GI dr for further evaluation. Will call our office if we can be of any further help. Nothing more needed at this time.

## 2014-09-21 ENCOUNTER — Other Ambulatory Visit: Payer: Self-pay | Admitting: *Deleted

## 2014-09-21 MED ORDER — INSULIN GLARGINE 100 UNIT/ML ~~LOC~~ SOLN: 21.0000 [IU] | Freq: Every day | SUBCUTANEOUS | Status: DC

## 2014-10-22 ENCOUNTER — Ambulatory Visit (INDEPENDENT_AMBULATORY_CARE_PROVIDER_SITE_OTHER): Payer: Managed Care, Other (non HMO) | Admitting: General Surgery

## 2014-10-22 ENCOUNTER — Encounter: Payer: Self-pay | Admitting: General Surgery

## 2014-10-22 VITALS — BP 114/78 | HR 78 | Resp 14 | Ht 72.0 in | Wt 170.0 lb

## 2014-10-22 DIAGNOSIS — K644 Residual hemorrhoidal skin tags: Secondary | ICD-10-CM

## 2014-10-22 DIAGNOSIS — K648 Other hemorrhoids: Secondary | ICD-10-CM
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
## 2014-10-22 NOTE — Patient Instructions (Addendum)
Hemorrhoidectomy Hemorrhoidectomy is surgery to remove hemorrhoids. Hemorrhoids are veins that have become swollen in the rectum. The rectum is the area from the bottom end of the intestines to the opening where bowel movements leave the body. Hemorrhoids can be uncomfortable. They can cause itching, bleeding and pain if a blood clot forms in them (thrombose). If hemorrhoids are small, surgery may not be needed. But if they cover a larger area, surgery is usually suggested.  LET YOUR CAREGIVER KNOW ABOUT:   Any allergies.  All medications you are taking, including:  Herbs, eyedrops, over-the-counter medications and creams.  Blood thinners (anticoagulants), aspirin or other drugs that could affect blood clotting.  Use of steroids (by mouth or as creams).  Previous problems with anesthetics, including local anesthetics.  Possibility of pregnancy, if this applies.  Any history of blood clots.  Any history of bleeding or other blood problems.  Previous surgery.  Smoking history.  Other health problems. RISKS AND COMPLICATIONS All surgery carries some risk. However, hemorrhoid surgery usually goes smoothly. Possible complications could include:  Urinary retention.  Bleeding.  Infection.  A painful incision.  A reaction to the anesthesia (this is not common). BEFORE THE PROCEDURE   Stop using aspirin and non-steroidal anti-inflammatory drugs (NSAIDs) for pain relief. This includes prescription drugs and over-the-counter drugs such as ibuprofen and naproxen. Also stop taking vitamin E. If possible, do this two weeks before your surgery.  If you take blood-thinners, ask your healthcare provider when you should stop taking them.  You will probably have blood and urine tests done several days before your surgery.  Do not eat or drink for about 8 hours before the surgery.  Arrive at least an hour before the surgery, or whenever your surgeon recommends. This will give you time to  check in and fill out any needed paperwork.  Hemorrhoidectomy is often an outpatient procedure. This means you will be able to go home the same day. Sometimes, though, people stay overnight in the hospital after the procedure. Ask your surgeon what to expect. Either way, make arrangements in advance for someone to drive you home. PROCEDURE   The preparation:  You will change into a hospital gown.  You will be given an IV. A needle will be inserted in your arm. Medication can flow directly into your body through this needle.  You might be given an enema to clear your rectum.  Once in the operating room, you will probably lie on your side or be repositioned later to lying on your stomach.  You will be given anesthesia (medication) so you will not feel anything during the surgery. The surgery often is done with local anesthesia (the area near the hemorrhoids will be numb and you will be drowsy but awake). Sometimes, general anesthesia is used (you will be asleep during the procedure).  The procedure:  There are a few different procedures for hemorrhoids. Be sure to ask you surgeon about the procedure, the risks and benefits.  Be sure to ask about what you need to do to take care of the wound, if there is one. AFTER THE PROCEDURE  You will stay in a recovery area until the anesthesia has worn off. Your blood pressure and pulse will be checked every so often.  You may feel a lot of pain in the area of the rectum.  Take all pain medication prescribed by your surgeon. Ask before taking any over-the-counter pain medicines.  Sometimes sitting in a warm bath can help relieve  your pain.  To make sure you have bowel movements without straining:  You will probably need to take stool softeners (usually a pill) for a few days.  You should drink 8 to 10 glasses of water each day.  Your activity will be restricted for awhile. Ask your caregiver for a list of what you should and should not do  while you recover. Document Released: 09/20/2009 Document Revised: 02/15/2012 Document Reviewed: 09/20/2009 [facility] Patient Information 2015 [place], [place]. This information is not intended to replace advice given to you by your health care provider. Make sure you discuss any questions you have with your health care provider.  Call with decision about hemorrhoid surgery

## 2014-10-24 DIAGNOSIS — K644 Residual hemorrhoidal skin tags: Secondary | ICD-10-CM | POA: Insufficient documentation

## 2014-12-18 ENCOUNTER — Telehealth: Payer: Self-pay | Admitting: *Deleted

## 2014-12-18 ENCOUNTER — Encounter: Payer: Self-pay | Admitting: Internal Medicine

## 2014-12-18 MED ORDER — BACLOFEN 20 MG PO TABS: ORAL_TABLET | ORAL | Status: DC

## 2014-12-18 NOTE — Telephone Encounter (Signed)
Spoke to pt, advised and  verbalized understanding. Agreeable to referral, but prefers to see someone other than Dr. Manuella Ghazi, in [place]. Advised [facility] would call once an appt has been scheduled.

## 2014-12-18 NOTE — Telephone Encounter (Signed)
Message from Dr. Nicki Reaper: Please call pt. Given that he is having problems with blood pressure variation as outlined, I am questioning if this is related to an autonomic response - with history history of paraplegia. I have referred him to Dr Manuella Ghazi for evaluation. This improved. I would like to send him back over for reevaluation given worsening. (has already had negative cardiac w/up). If agreeable, I will get Bonnita Nasuti to make the appt.

## 2014-12-18 NOTE — Telephone Encounter (Signed)
Fax from pharmacy requesting Baclofen 20 mg.  Last OV [phone], last refill [phone].  Please advise refill

## 2014-12-18 NOTE — Telephone Encounter (Signed)
Baclofen refilled #160 with 2 refills.

## 2014-12-19 ENCOUNTER — Encounter: Payer: Self-pay | Admitting: *Deleted

## 2014-12-19 DIAGNOSIS — G909 Disorder of the autonomic nervous system, unspecified: Secondary | ICD-10-CM

## 2014-12-19 DIAGNOSIS — E108 Type 1 diabetes mellitus with unspecified complications: Secondary | ICD-10-CM

## 2014-12-19 NOTE — Telephone Encounter (Signed)
Pt called after receiving mychart message re: Neurology referral. Wants to know if [facility] is his only option. Or can he try [facility] or [facility]. Please advise.  Callback # [phone]) [phone] Ext. [phone] (until 4:30)

## 2014-12-20 NOTE — Telephone Encounter (Signed)
Sent patient a mychart message.

## 2014-12-20 NOTE — Telephone Encounter (Signed)
I have not heard back from him.  Have your heard from him about if he wants [facility] or [place]?  Thanks.

## 2014-12-20 NOTE — Telephone Encounter (Deleted)
Dr. Nicki Reaper can refer you to [facility] or [facility]. Which place do you prefer?

## 2014-12-21 NOTE — Telephone Encounter (Signed)
Order placed for neurology referral.

## 2014-12-21 NOTE — Addendum Note (Signed)
Addended by: Alisa Graff on: 12/21/2014 01:28 PM   Modules accepted: Orders

## 2015-01-03 NOTE — Telephone Encounter (Signed)
Order placed for endocrinology

## 2015-01-03 NOTE — Addendum Note (Signed)
Addended by: Alisa Graff on: 01/03/2015 11:41 AM   Modules accepted: Orders

## 2015-01-09 ENCOUNTER — Encounter: Payer: Self-pay | Admitting: Endocrinology

## 2015-01-09 ENCOUNTER — Ambulatory Visit (INDEPENDENT_AMBULATORY_CARE_PROVIDER_SITE_OTHER): Payer: Managed Care, Other (non HMO) | Admitting: Endocrinology

## 2015-01-09 VITALS — BP 110/70 | HR 71 | Resp 14

## 2015-01-09 DIAGNOSIS — E109 Type 1 diabetes mellitus without complications: Secondary | ICD-10-CM

## 2015-01-09 LAB — COMPREHENSIVE METABOLIC PANEL
ALBUMIN: 4 g/dL (ref 3.5–5.2)
ALT: 13 U/L (ref 0–53)
AST: 21 U/L (ref 0–37)
Alkaline Phosphatase: 77 U/L (ref 39–117)
BUN: 9 mg/dL (ref 6–23)
CHLORIDE: 99 meq/L (ref 96–112)
CO2: 30 meq/L (ref 19–32)
CREATININE: 0.67 mg/dL (ref 0.40–1.50)
Calcium: 9.1 mg/dL (ref 8.4–10.5)
GFR: 136.45 mL/min (ref >60.00)
GLUCOSE: 118 mg/dL — AB (ref 70–99)
POTASSIUM: 4.7 meq/L (ref 3.5–5.1)
Sodium: 133 mEq/L — ABNORMAL LOW (ref 135–145)
Total Bilirubin: 0.4 mg/dL (ref 0.2–1.2)
Total Protein: 6.5 g/dL (ref 6.0–8.3)

## 2015-01-09 LAB — T4, FREE: Free T4: 0.9 ng/dL (ref 0.60–1.60)

## 2015-01-09 LAB — HEMOGLOBIN A1C: HEMOGLOBIN A1C: 7.2 % — AB (ref 4.6–6.5)

## 2015-01-09 LAB — TSH: TSH: 1.18 u[IU]/mL (ref 0.35–4.50)

## 2015-01-09 LAB — GLUCOSE, POCT (MANUAL RESULT ENTRY): POC Glucose: 152 mg/dl — AB (ref 70–99)

## 2015-01-09 NOTE — Patient Instructions (Signed)
Continue checking sugars 4-5 times daily.   Labs today. Decrease lantus to 27 units daily.  Continue current Novolog. Target for glucose correction 125 during the day.   Please come back for a follow-up appointment in 1 month.

## 2015-01-09 NOTE — Progress Notes (Signed)
Pre visit review using our clinic review tool, if applicable. No additional management support is needed unless otherwise documented below in the visit note. 
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
## 2015-01-10 ENCOUNTER — Encounter: Payer: Self-pay | Admitting: Endocrinology

## 2015-01-10 NOTE — Assessment & Plan Note (Signed)
Update A1c.  Discussed need for regular follow up for Diabetes care, atleast q3 months.  Discussed goal sugars and A1c, last A1c has been close to goal.  Discussed that he should bring his meter to next appointment so that his sugars could be reviewed.  Discussed switching long acting insulin to Tuojeo for flatter insulin absorption profile compared to lantus and he is not agreeable.  Recommended dose reduction in lantus and he is willing to decrease it to 27 units qhs only and not more.  Decrease lantus to 27 units daily. Offered to refer to DM education for advanced carb counting, but he declined.  Recommended changing target glucose for carb calculation to 125. Recommended doing this at bedtime as well, but he is not willing at this time. He will work on trying to target to 125 during the day.  Change ISF to 40, but he is not agreeable. Continue current ISF 50 for now.   I wonder if he might benefit for a GI referral to an academic place to help with problems with Gi motility.   RTC 1 month Reminded him to get eye exams. Labs today.

## 2015-02-12 ENCOUNTER — Ambulatory Visit (INDEPENDENT_AMBULATORY_CARE_PROVIDER_SITE_OTHER): Payer: Managed Care, Other (non HMO) | Admitting: Endocrinology

## 2015-02-12 ENCOUNTER — Encounter: Payer: Self-pay | Admitting: Endocrinology

## 2015-02-12 VITALS — BP 100/68 | HR 69 | Resp 14 | Ht 72.0 in | Wt 210.4 lb

## 2015-02-12 DIAGNOSIS — E109 Type 1 diabetes mellitus without complications: Secondary | ICD-10-CM

## 2015-02-12 NOTE — Patient Instructions (Signed)
Decrease lantus to 25 units.  Target glucose to 125.   Return to clinic- as needed. Encourage follow back with Dr Nicki Reaper every 3 months.
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
## 2015-03-25 ENCOUNTER — Other Ambulatory Visit: Payer: Self-pay | Admitting: *Deleted

## 2015-03-25 MED ORDER — BACLOFEN 20 MG PO TABS: ORAL_TABLET | ORAL | Status: DC

## 2015-03-25 NOTE — Telephone Encounter (Signed)
Ok refill? 

## 2015-03-25 NOTE — Telephone Encounter (Signed)
Refilled baclofen #180 with 2 refills.

## 2015-04-02 ENCOUNTER — Other Ambulatory Visit: Payer: Self-pay | Admitting: *Deleted

## 2015-04-02 MED ORDER — INSULIN ASPART 100 UNIT/ML ~~LOC~~ SOLN: SUBCUTANEOUS | Status: DC

## 2015-04-05 ENCOUNTER — Other Ambulatory Visit: Payer: Self-pay | Admitting: Internal Medicine

## 2015-04-05 MED ORDER — INSULIN GLARGINE 100 UNIT/ML ~~LOC~~ SOLN: 21.0000 [IU] | Freq: Every day | SUBCUTANEOUS | Status: DC

## 2015-04-05 NOTE — Telephone Encounter (Signed)
Lantus refilled as requested.

## 2015-06-03 ENCOUNTER — Other Ambulatory Visit: Payer: Self-pay

## 2015-07-22 ENCOUNTER — Encounter: Payer: No Typology Code available for payment source | Attending: Surgery | Admitting: Surgery

## 2015-07-22 DIAGNOSIS — L89153 Pressure ulcer of sacral region, stage 3: Secondary | ICD-10-CM | POA: Insufficient documentation

## 2015-07-22 DIAGNOSIS — G8250 Quadriplegia, unspecified: Secondary | ICD-10-CM | POA: Insufficient documentation

## 2015-07-22 DIAGNOSIS — Z794 Long term (current) use of insulin: Secondary | ICD-10-CM | POA: Insufficient documentation

## 2015-07-22 DIAGNOSIS — E10622 Type 1 diabetes mellitus with other skin ulcer: Secondary | ICD-10-CM | POA: Insufficient documentation

## 2015-07-23 NOTE — Progress Notes (Signed)
TRYONE, KILLE ([id number]) Visit Report for 07/22/2015 Allergy List Details Patient Name: [name], [name] 07/22/2015 2:30 Date of Service: PM Medical Record [id number] Number: Patient Account Number: [id number] Date of Birth/Sex: 1971-08-07 (44 y.o. Male) Treating RN: Cornell Barman Primary Care Other Clinician: Einar Pheasant Physician: Treating Christin Fudge [facility] PA, Physician/Extender: Referring Physician: Maudry Diego Weeks in Treatment: 0 Allergies Active Allergies Sulfa (Sulfonamide Antibiotics) Reaction: tongue swells Iodinated Contrast Media - IV Dye decongestant Allergy Notes Electronic Signature(s) Signed: 07/22/2015 6:31:19 PM By: Gretta Cool, RN, BSN, Kim RN, BSN Entered By: Gretta Cool, RN, BSN, Kim on 07/22/2015 14:52:58 Bohlken, [name] ([id number]) -------------------------------------------------------------------------------- [place] Information Details Patient Name: [name], [name] 07/22/2015 2:30 Date of Service: PM Medical Record [id number] Number: Patient Account Number: [id number] Date of Birth/Sex: [birth year] (44 y.o. Male) Treating RN: Cornell Barman Primary Care Other Clinician: Einar Pheasant Physician: Treating Christin Fudge [facility] PA, Physician/Extender: Referring Physician: Maudry Diego Weeks in Treatment: 0 Visit Information Patient Arrived: Wheel Chair Arrival Time: 14:46 Accompanied By: self Transfer Assistance: None Patient Identification Verified: Yes Secondary Verification Process Yes Completed: Patient Has Alerts: Yes Patient Alerts: Patient on Blood Thinner Type I Diabetic History Since Last Visit Electronic Signature(s) Signed: 07/22/2015 6:31:19 PM By: Gretta Cool, RN, BSN, Kim RN, BSN Entered By: Gretta Cool, RN, BSN, Kim on 07/22/2015 14:47:08 Mooers, [name] ([id number]) -------------------------------------------------------------------------------- Clinic Level of Care Assessment Details Patient Name: [name], [name]  07/22/2015 2:30 Date of Service: PM Medical Record [id number] Number: Patient Account Number: [id number] Date of Birth/Sex: 1971/06/20 (44 y.o. Male) Treating RN: Cornell Barman Primary Care Other Clinician: Einar Pheasant Physician: Treating Christin Fudge [facility] [place], Physician/Extender: Referring Physician: Maudry Diego Weeks in Treatment: 0 Clinic Level of Care Assessment Items TOOL 1 Quantity Score []  - Use when EandM and Procedure is performed on INITIAL visit 0 ASSESSMENTS - Nursing Assessment / Reassessment X - General Physical Exam (combine w/ comprehensive assessment (listed just 1 20 below) when performed on new pt. evals) X - Comprehensive Assessment (HX, ROS, Risk Assessments, Wounds Hx, etc.) 1 25 ASSESSMENTS - Wound and Skin Assessment / Reassessment []  - Dermatologic / Skin Assessment (not related to wound area) 0 ASSESSMENTS - Ostomy and/or Continence Assessment and Care []  - Incontinence Assessment and Management 0 []  - Ostomy Care Assessment and Management (repouching, etc.) 0 PROCESS - Coordination of Care X - Simple Patient / Family Education for ongoing care 1 15 []  - Complex (extensive) Patient / Family Education for ongoing care 0 X - Staff obtains Programmer, systems, Records, Test Results / Process Orders 1 10 []  - Staff telephones HHA, Nursing Homes / Clarify orders / etc 0 []  - Routine Transfer to another Facility (non-emergent condition) 0 []  - Routine Hospital Admission (non-emergent condition) 0 X - New Admissions / Biomedical engineer / Ordering NPWT, Apligraf, etc. 1 15 []  - Emergency Hospital Admission (emergent condition) 0 PROCESS - Special Needs []  - Pediatric / Minor Patient Management 0 Charo, [name] ([id number]) []  - Isolation Patient Management 0 []  - Hearing / Language / Visual special needs 0 []  - Assessment of Community assistance (transportation, D/C planning, etc.) 0 []  - Additional assistance / Altered mentation 0 []  - Support  Surface(s) Assessment (bed, cushion, seat, etc.) 0 INTERVENTIONS - Miscellaneous []  - External ear exam 0 []  - Patient Transfer (multiple staff / Civil Service fast streamer / Similar devices) 0 []  - Simple Staple / Suture removal (25 or less) 0 []  - Complex Staple / Suture removal (26 or more) 0 []  -  Hypo/Hyperglycemic Management (do not check if billed separately) 0 []  - Ankle / Brachial Index (ABI) - do not check if billed separately 0 Has the patient been seen at the hospital within the last three years: Yes Total Score: 85 Level Of Care: New/Established - Level 3 Electronic Signature(s) Signed: 07/22/2015 6:31:19 PM By: Gretta Cool, RN, BSN, Kim RN, BSN Entered By: Gretta Cool, RN, BSN, Kim on 07/22/2015 17:45:48 Dedominicis, [name] ([id number]) -------------------------------------------------------------------------------- Encounter Discharge Information Details Patient Name: [name], [name] 07/22/2015 2:30 Date of Service: PM Medical Record [id number] Number: Patient Account Number: [id number] Date of Birth/Sex: 04/21/1971 (44 y.o. Male) Treating RN: Montey Hora Primary Care Other Clinician: Einar Pheasant Physician: Treating Delton Coombes [facility] PA, Physician/Extender: Referring Physician: Maudry Diego Weeks in Treatment: 0 Encounter Discharge Information Items Discharge Pain Level: 0 Discharge Condition: Stable Ambulatory Status: Wheelchair Discharge Destination: Home Transportation: Private Auto Accompanied By: self Schedule Follow-up Appointment: Yes Medication Reconciliation completed and provided to Patient/Care Yes Jovani Flury: Provided on Clinical Summary of Care: 07/22/2015 Form Type Recipient Paper Patient SR Electronic Signature(s) Signed: 07/22/2015 6:31:19 PM By: Gretta Cool, RN, BSN, Kim RN, BSN Previous Signature: 07/22/2015 3:49:33 PM Version By: Ruthine Dose Entered By: Gretta Cool RN, BSN, Kim on 07/22/2015 17:46:46 Paxtang, [name]  ([id number]) -------------------------------------------------------------------------------- Multi Wound Chart Details Patient Name: [name], [name] 07/22/2015 2:30 Date of Service: PM Medical Record [id number] Number: Patient Account Number: [id number] Date of Birth/Sex: 09-12-1971 (44 y.o. 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] [N/A:N/A] Comorbid History: [2:Type I Diabetes, History of N/A pressure wounds, Paraplegia] Date Acquired: [2:06/06/2015] [N/A:N/A] Weeks of Treatment: [2:0] [N/A:N/A] Wound Status: [2:Open] [N/A:N/A] Measurements L x W x D 0.5x1x0.3 [N/A:N/A] (cm) Area (cm) : [2:0.393] [N/A:N/A] Volume (cm) : [2:0.118] [N/A:N/A] % Reduction in Area: [2:0.00%] [N/A:N/A] % Reduction in Volume: 0.00% [N/A:N/A] Classification: [2:Category/Stage II] [N/A:N/A] Exudate Amount: [2:Small] [N/A:N/A] Exudate Type: [2:Serous] [N/A:N/A] Exudate Color: [2:amber] [N/A:N/A] Wound Margin: [2:Thickened] [N/A:N/A] Granulation Amount: [2:Small (1-33%)] [N/A:N/A] Granulation Quality: [2:Red, Pink, Pale] [N/A:N/A] Necrotic Amount: [2:Small (1-33%)] [N/A:N/A] Exposed Structures: [2:Fascia: No Fat: No Tendon: No] [N/A:N/A] Muscle: No Joint: No Bone: No Limited to Skin Breakdown Epithelialization: Small (1-33%) N/A N/A Periwound Skin Texture: Edema: No N/A N/A Excoriation: No Induration: No Callus: No Crepitus: No Fluctuance: No Friable: No Rash: No Scarring: No Periwound  Skin Maceration: No N/A N/A Moisture: Moist: No Dry/Scaly: No Periwound Skin Color: Atrophie Blanche: No N/A N/A Cyanosis: No Ecchymosis: No Erythema: No Hemosiderin Staining: No Mottled: No Pallor: No Rubor: No Tenderness on No N/A N/A Palpation: Wound Preparation: Ulcer Cleansing: N/A N/A Rinsed/Irrigated with Saline Topical Anesthetic Applied: Other: lidocaine 4% Treatment Notes Electronic Signature(s) Signed: 07/22/2015 6:31:19 PM By: Gretta Cool, RN, BSN, Kim RN, BSN Entered By: Gretta Cool, RN, BSN, Kim on 07/22/2015 15:21:19 Cabell, [name] ([id number]) -------------------------------------------------------------------------------- [place] Details Patient Name: [name], [name] 07/22/2015 2:30 Date of Service: PM Medical Record [id number] Number: Patient Account Number: [id number] Date of Birth/Sex: Jan 06, 1971 (44 y.o. Male) Treating RN: Cornell Barman Primary Care Other Clinician: Einar Pheasant Physician: Treating Christin Fudge [facility] PA, Physician/Extender: Referring Physician: Maudry Diego Weeks in Treatment: 0 Active Inactive Orientation to the Wound  Care Program Nursing Diagnoses: Knowledge deficit related to the wound healing center program Goals: Patient/caregiver will verbalize understanding of the [place] Program Date Initiated: 07/22/2015 Goal Status: Active Interventions: Provide education on orientation to the wound center Notes: Wound/Skin Impairment Nursing Diagnoses: Impaired tissue integrity Goals: Ulcer/skin breakdown will heal within 14 weeks Date Initiated: 07/22/2015 Goal Status: Active Interventions: Assess ulceration(s) every visit Treatment Activities: Skin care regimen initiated : 07/22/2015 Notes: Electronic Signature(s) DANI, DANIS ([id number]) Signed: 07/22/2015 6:31:19 PM By: Gretta Cool, RN, BSN, Kim RN, BSN Entered By: Gretta Cool, RN, BSN, Kim on 07/22/2015 15:21:09 Okazaki, [name]  ([id number]) -------------------------------------------------------------------------------- Pain Assessment Details Patient Name: [name], [name] 07/22/2015 2:30 Date of Service: PM Medical Record [id number] Number: Patient Account Number: [id number] Date of Birth/Sex: 17-Aug-1971 (44 y.o. Male) Treating RN: Cornell Barman Primary Care Other Clinician: Einar Pheasant Physician: Treating Christin Fudge [facility] [place], Physician/Extender: Referring Physician: Maudry Diego Weeks in Treatment: 0 Active Problems Location of Pain Severity and Description of Pain Patient Has Paino No Site Locations Pain Management and Medication Current Pain Management: Electronic Signature(s) Signed: 07/22/2015 6:31:19 PM By: Gretta Cool, RN, BSN, Kim RN, BSN Entered By: Gretta Cool, RN, BSN, Kim on 07/22/2015 14:47:16 Cassara, [name] ([id number]) -------------------------------------------------------------------------------- Patient/Caregiver Education Details Patient Name: [name], [name] 07/22/2015 2:30 Date of Service: PM Medical Record [id number] Number: Patient Account Number: [id number] Date of Birth/Gender: 1971-07-06 (44 y.o. Male) Treating RN: Cornell Barman Primary Care Other Clinician: Einar Pheasant Physician: Treating Christin Fudge [facility] PA, Physician/Extender: Referring Physician: Maudry Diego Weeks in Treatment: 0 Education Assessment Education Provided To: Patient Education Topics Provided Welcome To The [place]: Handouts: Welcome To [facility] Methods: Demonstration, Explain/Verbal Responses: State content correctly Wound/Skin Impairment: Handouts: Caring for Your Ulcer, Other: wound care as prescribed Electronic Signature(s) Signed: 07/22/2015 6:31:19 PM By: Gretta Cool, RN, BSN, Kim RN, BSN Entered By: Gretta Cool, RN, BSN, Kim on 07/22/2015 17:47:11 Corrales, [name]  ([id number]) -------------------------------------------------------------------------------- Wound Assessment Details Patient Name: [name], [name] 07/22/2015 2:30 Date of Service: PM Medical Record [id number] Number: Patient Account Number: [id number] Date of Birth/Sex: [birth year] (44 y.o. Male) Treating RN: Cornell Barman Primary Care Other Clinician: Einar Pheasant Physician: Treating Christin Fudge [facility] [place], Physician/Extender: Referring Physician: Maudry Diego Weeks in Treatment: 0 Wound Status Wound Number: 2 Primary Pressure Ulcer Etiology: Wound Location: Sacrum - Medial Wound Open Wounding Event: Pressure Injury Status: Date Acquired: 06/06/2015 Comorbid Type I Diabetes, History of pressure Weeks Of Treatment: 0 History: wounds, Paraplegia Clustered Wound: No Photos Photo Uploaded By: Gretta Cool, RN, BSN, Kim on 07/22/2015 18:30:09 Wound Measurements Length: (cm) 0.5 Width: (cm) 1 Depth: (cm) 0.3 Area: (cm) 0.393 Volume: (cm) 0.118 % Reduction in Area: 0% % Reduction in Volume: 0% Epithelialization: Small (1-33%) Tunneling: No Undermining: No Wound Description Classification: Category/Stage II Wound Margin: Thickened Exudate Amount: Small Exudate Type: Serous Exudate Color: amber Wound Bed Granulation Amount: Small (1-33%) Exposed Structure Granulation Quality: Red, Pink, Pale Fascia Exposed: No Boschert, [name] ([id number]) Necrotic Amount: Small (1-33%) Fat Layer Exposed: No Necrotic Quality: Adherent Slough Tendon Exposed: No Muscle Exposed: No Joint Exposed: No Bone Exposed: No Limited to Skin Breakdown Periwound Skin Texture Texture Color No Abnormalities Noted: No No Abnormalities Noted: No Callus: No Atrophie Blanche: No Crepitus: No Cyanosis: No Excoriation: No Ecchymosis: No Fluctuance: No Erythema: No Friable: No Hemosiderin Staining: No Induration: No Mottled: No Localized Edema: No Pallor: No Rash: No Rubor:  No Scarring: No Moisture No Abnormalities Noted: No Dry /  Scaly: No Maceration: No Moist: No Wound Preparation Ulcer Cleansing: Rinsed/Irrigated with Saline Topical Anesthetic Applied: Other: lidocaine 4%, Treatment Notes Wound #2 (Medial Sacrum) 1. Cleansed with: Clean wound with Normal Saline 2. Anesthetic Topical Lidocaine 4% cream to wound bed prior to debridement 4. Dressing Applied: Aquacel Ag 5. Secondary Dressing Applied Bordered Foam Dressing Electronic Signature(s) Signed: 07/22/2015 6:31:19 PM By: Gretta Cool, RN, BSN, Kim RN, BSN Entered By: Gretta Cool, RN, BSN, Kim on 07/22/2015 15:09:03 Hollis, [name] ([id number]) -------------------------------------------------------------------------------- Vitals Details Patient Name: [name], [name] 07/22/2015 2:30 Date of Service: PM Medical Record [id number] Number: Patient Account Number: [id number] Date of Birth/Sex: 03/30/1971 (44 y.o. Male) Treating RN: Cornell Barman Primary Care Other Clinician: Einar Pheasant Physician: Treating Christin Fudge [facility] PA, Physician/Extender: Referring Physician: Maudry Diego Weeks in Treatment: 0 Vital Signs Time Taken: 14:47 Temperature (F): 98.3 Height (in): 73 Pulse (bpm): 70 Source: Stated Respiratory Rate (breaths/min): 18 Weight (lbs): 170 Blood Pressure (mmHg): 122/82 Source: Stated Reference Range: 80 - 120 mg / dl Body Mass Index (BMI): 22.4 Electronic Signature(s) Signed: 07/22/2015 6:31:19 PM By: Gretta Cool, RN, BSN, Kim RN, BSN Entered By: Gretta Cool, RN, BSN, Kim on 07/22/2015 15:03:12

## 2015-07-23 NOTE — Progress Notes (Signed)
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 Osteomyelitis Neurologic Complaints and Symptoms: Negative for: Numbness/parasthesias; Focal/Weakness Medical History: Positive for: Paraplegia - chest down Negative for: Dementia; Neuropathy; Quadriplegia; Seizure Disorder Psychiatric Complaints and Symptoms: Negative for: Anxiety; Claustrophobia Medical History: Negative for: Anorexia/bulimia; Confinement Anxiety Cardiovascular Rhines, [name] ([id number]) Complaints and Symptoms: No Complaints or Symptoms Medical History: Negative for: Angina; Arrhythmia; Congestive Heart Failure; Coronary Artery Disease; Deep Vein Thrombosis; Hypertension; Hypotension; Myocardial Infarction; Peripheral Arterial Disease; Peripheral Venous Disease; Phlebitis; Vasculitis Oncologic Complaints and Symptoms: No Complaints or Symptoms Medical History: Negative for: Received Chemotherapy; Received Radiation Family and Social History Cancer: Yes - Father; Diabetes: No; Heart Disease: No;  Hypertension: Yes - Siblings, Paternal Grandparents, Father; Kidney Disease: No; Lung Disease: No; Seizures: No; Stroke: No; Thyroid Problems: No; Never smoker; Marital Status - Single; Alcohol Use: Daily; Caffeine Use: Moderate; Living Will: Yes (Not Provided); Medical Power of Attorney: Yes Electronic Signature(s) Signed: 07/22/2015 4:38:47 PM By: Christin Fudge MD, FACS Signed: 07/22/2015 6:31:19 PM By: Gretta Cool RN, BSN, Kim RN, BSN Entered By: Gretta Cool, RN, BSN, Kim on 07/22/2015 15:00:03 [place], [name] ([id number]) -------------------------------------------------------------------------------- SuperBill Details  Patient Name: KENNETT, SYMES. Date of Service: 07/22/2015 Medical Record Patient Account Number: [id number] Number: Dorthy, Treating RN: Date of Birth/Sex: 12/04/1971 (44 y.o. 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 Pressure ulcer of sacral region, stage 3 G82.50 Quadriplegia, unspecified Facility Procedures CPT4 Code: 28979150 Description: 99213 - WOUND CARE VISIT-LEV 3 EST PT Modifier: Quantity: 1 CPT4 Code: 41364383 Description: 77939 - DEB SUBQ TISSUE 20 SQ CM/< ICD-10 Description Diagnosis E10.622 Type 1 diabetes mellitus with other skin ulcer 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

## 2015-07-23 NOTE — Progress Notes (Signed)
KANAI, BERRIOS ([id number]) Visit Report for 07/22/2015 Abuse/Suicide Risk Screen Details Patient Name: [name], [name] 07/22/2015 2:30 Date of Service: PM Medical Record [id number] Number: Patient Account Number: [id number] Date of Birth/Sex: November 18, 1971 (44 y.o. Male) Treating RN: Cornell Barman Primary Care Other Clinician: Einar Pheasant Physician: Treating Christin Fudge [facility] [place], Physician/Extender: Referring Physician: Maudry Diego Weeks in Treatment: 0 Abuse/Suicide Risk Screen Items Answer ABUSE/SUICIDE RISK SCREEN: Has anyone close to you tried to hurt or harm you recentlyo No Do you feel uncomfortable with anyone in your familyo No Has anyone forced you do things that you didnot want to doo No Do you have any thoughts of harming yourselfo No Patient displays signs or symptoms of abuse and/or neglect. No Electronic Signature(s) Signed: 07/22/2015 6:31:19 PM By: Gretta Cool, RN, BSN, Kim RN, BSN Entered By: Gretta Cool, RN, BSN, Kim on 07/22/2015 15:00:23 [place], [name] ([id number]) -------------------------------------------------------------------------------- Activities of Daily Living Details Patient Name: [name], [name] 07/22/2015 2:30 Date of Service: PM Medical Record [id number] Number: Patient Account Number: [id number] Date of Birth/Sex: [birth year] (44 y.o. Male) Treating RN: Cornell Barman Primary Care Other Clinician: Einar Pheasant Physician: Treating Christin Fudge [facility] [place], Physician/Extender: Referring Physician: Maudry Diego Weeks in Treatment: 0 Activities of Daily Living Items Answer Activities of Daily Living (Please select one for each item) Drive Automobile Completely Able Take Medications Completely Able Use Telephone Completely Able Care for Appearance Completely Able Use Toilet Completely Able Bath / Shower Completely Able Dress Self Completely Able Feed Self Completely Able Walk Completely Able Get In / Out Bed Completely  [place] for Self Completely Able Electronic Signature(s) Signed: 07/22/2015 6:31:19 PM By: Gretta Cool, RN, BSN, Kim RN, BSN Entered By: Gretta Cool, RN, BSN, Kim on 07/22/2015 15:00:37 [place], [name] ([id number]) -------------------------------------------------------------------------------- Education Assessment Details Patient Name: [name], [name] 07/22/2015 2:30 Date of Service: PM Medical Record [id number] Number: Patient Account Number: [id number] Date of Birth/Sex: September 07, 1971 (44 y.o. Male) Treating RN: Cornell Barman Primary Care Other Clinician: Einar Pheasant Physician: Treating Delton Coombes [facility] [place], Physician/Extender: Referring Physician: Maudry Diego Weeks in Treatment: 0 Primary Learner Assessed: Patient Learning Preferences/Education Level/Primary Language Learning Preference: Explanation, Demonstration, Communication Board Highest Education Level: College or Above Preferred Language: English Cognitive Barrier Assessment/Beliefs Language Barrier: No Translator Needed: No Memory Deficit: No Emotional Barrier: No Cultural/Religious Beliefs Affecting Medical No Care: Physical Barrier Assessment Impaired Vision: No Impaired Hearing: No Decreased Hand dexterity: No Knowledge/Comprehension Assessment Knowledge Level: High Comprehension Level: High Ability to understand written High instructions: Ability to understand verbal High instructions: Motivation Assessment Anxiety Level: Calm Cooperation: Cooperative Education Importance: Acknowledges Need Interest in Health Problems: Asks Questions Perception: Coherent Willingness to Engage in Self- High Management Activities: High [place], [name] ([id number]) Readiness to Engage in Self- Management Activities: Electronic Signature(s) Signed: 07/22/2015 6:31:19 PM By: Gretta Cool, RN, BSN, Kim RN, BSN Entered By: Gretta Cool, RN,  BSN, Kim on 07/22/2015 15:01:12 [place], [name] ([id number]) -------------------------------------------------------------------------------- Fall Risk Assessment Details Patient Name: [name] 07/22/2015 2:30 Date of Service: PM Medical Record [id number] Number: Patient Account Number: [id number] Date of Birth/Sex: June 02, 1971 (44 y.o. Male) Treating RN: Cornell Barman Primary Care Other Clinician: Einar Pheasant Physician: Treating Christin Fudge [facility] [place], Physician/Extender: Referring Physician: Maudry Diego Weeks in Treatment: 0 Fall Risk Assessment Items FALL RISK ASSESSMENT: History of falling - immediate or within 3 months 25 Yes Secondary diagnosis 15 Yes Ambulatory aid None/bed rest/wheelchair/nurse 0 Yes Crutches/cane/walker 0  No Furniture 0 No IV Access/Saline Lock 0 No Gait/Training Normal/bed rest/immobile 0 No Weak 0 No Impaired 20 Yes Mental Status Oriented to own ability 0 Yes Electronic Signature(s) Signed: 07/22/2015 6:31:19 PM By: Gretta Cool, RN, BSN, Kim RN, BSN Entered By: Gretta Cool, RN, BSN, Kim on 07/22/2015 15:01:35 [place], [name] ([id number]) -------------------------------------------------------------------------------- Nutrition Risk Assessment Details Patient Name: [name], [name] 07/22/2015 2:30 Date of Service: PM Medical Record [id number] Number: Patient Account Number: [id number] Date of Birth/Sex: 1971-09-15 (44 y.o. Male) Treating RN: Cornell Barman Primary Care Other Clinician: Einar Pheasant Physician: Treating Christin Fudge [facility] [place], Physician/Extender: Referring Physician: Maudry Diego Weeks in Treatment: 0 Height (in): 73 Weight (lbs): 170 Body Mass Index (BMI): 22.4 Nutrition Risk Assessment Items NUTRITION RISK SCREEN: I have an illness or condition that made me change the kind and/or 0 No amount of food I eat I eat fewer than two meals per day 0 No I eat few fruits and vegetables, or milk products 0 No I  have three or more drinks of beer, liquor or wine almost every day 0 No I have tooth or mouth problems that make it hard for me to eat 0 No I don't always have enough money to buy the food I need 0 No I eat alone most of the time 0 No I take three or more different prescribed or over-the-counter drugs a 0 No day Without wanting to, I have lost or gained 10 pounds in the last six 0 No months I am not always physically able to shop, cook and/or feed myself 0 No Nutrition Protocols Good Risk Protocol 0 No interventions needed Moderate Risk Protocol Electronic Signature(s) Signed: 07/22/2015 6:31:19 PM By: Gretta Cool, RN, BSN, Kim RN, BSN Entered By: Gretta Cool, RN, BSN, Kim on 07/22/2015 15:03:32

## 2015-07-29 ENCOUNTER — Telehealth: Payer: Self-pay

## 2015-07-29 MED ORDER — BACLOFEN 20 MG PO TABS: ORAL_TABLET | ORAL | Status: DC

## 2015-07-29 NOTE — Telephone Encounter (Signed)
I sent in rx for baclofen #180 with no refills.  He needs a f/u appt before next refill (22mn appt).

## 2015-07-29 NOTE — Telephone Encounter (Signed)
Received a refill request for Baclofen 81m tabs. Last refilled 03/25/15 for #180 with 2 refills. Last office visit 02/12/15. Okay to refill?

## 2015-07-30 ENCOUNTER — Encounter: Payer: No Typology Code available for payment source | Admitting: Surgery

## 2015-07-30 DIAGNOSIS — L89153 Pressure ulcer of sacral region, stage 3: Secondary | ICD-10-CM | POA: Diagnosis not present
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
## 2015-07-31 NOTE — Progress Notes (Signed)
CLAYBORNE, DIVIS ([id number]) Visit Report for 07/30/2015 Arrival Information Details Patient Name: [name], [name]. Date of Service: 07/30/2015 3:15 PM Medical Record Number: [id number] Patient Account Number: [id number] Date of Birth/Sex: Oct 05, 1971 (44 y.o. Male) Treating RN: Montey Hora Primary Care Physician: Einar Pheasant Other Clinician: Referring Physician: Einar Pheasant Treating Physician/Extender: Frann Rider in Treatment: 1 Visit Information History Since Last Visit Added or deleted any medications: No Patient Arrived: Wheel Chair Any new allergies or adverse reactions: No Arrival Time: 15:20 Had a fall or experienced change in No Accompanied By: self activities of daily living that may affect Transfer Assistance: None risk of falls: Patient Identification Verified: Yes Signs or symptoms of abuse/neglect since last No Secondary Verification Process Yes visito Completed: Hospitalized since last visit: No Patient Has Alerts: Yes Pain Present Now: No Patient Alerts: Patient on Blood Thinner Type I Diabetic Electronic Signature(s) Signed: 07/30/2015 5:20:03 PM By: Montey Hora Entered By: Montey Hora on 07/30/2015 15:25:27 [place], [name] ([id number]) -------------------------------------------------------------------------------- Encounter Discharge Information Details Patient Name: [name]. Date of Service: 07/30/2015 3:15 PM Medical Record Number: [id number] Patient Account Number: [id number] Date of Birth/Sex: 1971-02-01 (44 y.o. Male) Treating RN: Montey Hora Primary Care Physician: Einar Pheasant Other Clinician: Referring Physician: Einar Pheasant Treating Physician/Extender: Frann Rider in Treatment: 1 Encounter Discharge Information Items Discharge Pain Level: 0 Discharge Condition: Stable Ambulatory Status: Wheelchair Discharge Destination: Home Transportation: Private Auto Accompanied By: self Schedule  Follow-up Appointment: Yes Medication Reconciliation completed and provided to Patient/Care No Mettie Roylance: Provided on Clinical Summary of Care: 07/30/2015 Form Type Recipient Paper Patient SR Electronic Signature(s) Signed: 07/30/2015 3:53:33 PM By: Ruthine Dose Entered By: Ruthine Dose on 07/30/2015 15:53:33 Groninger, [name] ([id number]) -------------------------------------------------------------------------------- Multi Wound Chart Details Patient Name: [name]. Date of Service: 07/30/2015 3:15 PM Medical Record Number: [id number] Patient Account Number: [id number] Date of Birth/Sex: 20-May-1971 (44 y.o. 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] [N/A:N/A] Comorbid History: [2:Type I Diabetes, History of N/A pressure wounds, Paraplegia] Date Acquired: [2:06/06/2015] [N/A:N/A] Weeks of Treatment: [2:1] [N/A:N/A] Wound Status: [2:Open] [N/A:N/A] Measurements L x W x D 0.5x1.4x0.3 [N/A:N/A] (cm) Area (cm) : [2:0.55] [N/A:N/A] Volume (cm) : [2:0.165] [N/A:N/A] % Reduction in Area: [2:-39.90%] [N/A:N/A] % Reduction in Volume: -39.80% [N/A:N/A] Classification: [2:Category/Stage III] [N/A:N/A] Exudate Amount: [2:Small] [N/A:N/A] Exudate Type: [2:Serous] [N/A:N/A] Exudate Color: [2:amber] [N/A:N/A] Wound Margin: [2:Thickened] [N/A:N/A] Granulation Amount: [2:Large (67-100%)] [N/A:N/A] Granulation Quality: [2:Red, Pink, Pale] [N/A:N/A] Necrotic Amount: [2:Small (1-33%)]  [N/A:N/A] Exposed Structures: [2:Fascia: No Fat: No Tendon: No Muscle: No Joint: No Bone: No] [N/A:N/A] Limited to Skin Breakdown Epithelialization: Small (1-33%) N/A N/A Periwound Skin Texture: Edema: No N/A N/A Excoriation: No Induration: No Callus: No Crepitus: No Fluctuance: No Friable: No Rash: No Scarring: No Periwound Skin Maceration: No N/A N/A Moisture: Moist: No Dry/Scaly: No Periwound Skin Color: Atrophie Blanche: No N/A N/A Cyanosis: No Ecchymosis: No Erythema: No Hemosiderin Staining: No Mottled: No Pallor: No Rubor: No Tenderness on No N/A N/A Palpation: Wound Preparation: Ulcer Cleansing: N/A N/A Rinsed/Irrigated with Saline Topical Anesthetic Applied: Other: lidocaine 4% Treatment Notes Electronic Signature(s) Signed: 07/30/2015 5:20:03 PM By: Montey Hora Entered By: Montey Hora on  07/30/2015 15:37:25 Galluzzo, [name] ([id number]) -------------------------------------------------------------------------------- Multi-Disciplinary Care Plan Details Patient Name: [name]. Date of Service: 07/30/2015 3:15 PM Medical Record Number: [id number] Patient Account Number: [id number] Date of Birth/Sex: [birth year] (44 y.o. Male) Treating RN: Montey Hora Primary Care Physician: Einar Pheasant Other Clinician: Referring Physician: Einar Pheasant Treating Physician/Extender: Frann Rider in Treatment: 1 Active Inactive Orientation to the Wound Care Program Nursing Diagnoses: Knowledge deficit related to the wound healing center program Goals: Patient/caregiver will verbalize understanding of the [facility] Date Initiated: 07/22/2015 Goal Status: Active Interventions: Provide education on orientation to the wound center Notes: Wound/Skin Impairment Nursing Diagnoses: Impaired tissue integrity Goals: Ulcer/skin breakdown will heal within 14 weeks Date Initiated: 07/22/2015 Goal Status:  Active Interventions: Assess ulceration(s) every visit Treatment Activities: Skin care regimen initiated : 07/30/2015 Notes: Electronic Signature(s) Signed: 07/30/2015 5:20:03 PM By: Montey Hora Entered By: Montey Hora on 07/30/2015 15:34:39 Faulstich, [name] ([id number]) Krisko, [name] ([id number]) -------------------------------------------------------------------------------- Patient/Caregiver Education Details Patient Name: [name]. Date of Service: 07/30/2015 3:15 PM Medical Record Number: [id number] Patient Account Number: [id number] Date of Birth/Gender: 21-May-1971 (44 y.o. Male) Treating RN: Montey Hora Primary Care Physician: Einar Pheasant Other Clinician: Referring Physician: Einar Pheasant Treating Physician/Extender: Frann Rider in Treatment: 1 Education Assessment Education Provided To: Patient Education Topics Provided Pressure: Handouts: Other: continue offoading Methods: Explain/Verbal Responses: State content correctly Wound/Skin Impairment: Handouts: Other: continue wound care as ordered Methods: Demonstration, Explain/Verbal Responses: State content correctly Electronic Signature(s) Signed: 07/30/2015 5:20:03 PM By: Montey Hora Entered By: Montey Hora on 07/30/2015 15:46:36 Maker, [name] ([id number]) -------------------------------------------------------------------------------- Wound Assessment Details Patient Name: [name]. Date of Service: 07/30/2015 3:15 PM Medical Record Number: [id number] Patient Account Number: [id number] Date of Birth/Sex: 1971-08-29 (44 y.o. Male) Treating RN: Montey Hora Primary Care Physician: Einar Pheasant Other Clinician: Referring Physician: Einar Pheasant Treating Physician/Extender: Frann Rider in Treatment: 1 Wound Status Wound Number: 2 Primary Pressure Ulcer Etiology: Wound Location: Sacrum - Medial Wound Open Wounding Event: Pressure Injury Status: Date  Acquired: 06/06/2015 Comorbid Type I Diabetes, History of pressure Weeks Of Treatment: 1 History: wounds, Paraplegia Clustered Wound: No Photos Photo Uploaded By: Gretta Cool, RN, BSN, Kim on 07/30/2015 17:14:05 Wound Measurements Length: (cm) 0.5 Width: (cm) 1.4 Depth: (cm) 0.3 Area: (cm) 0.55 Volume: (cm) 0.165 % Reduction in Area: -39.9% % Reduction in Volume: -39.8% Epithelialization: Small (1-33%) Tunneling: No Undermining: No Wound Description Classification: Category/Stage III Wound Margin: Thickened Exudate Amount: Small Exudate Type: Serous Exudate Color: amber Wound Bed Granulation Amount: Large (67-100%) Exposed Structure Granulation Quality: Red, Pink, Pale Fascia Exposed: No Necrotic Amount: Small (1-33%) Fat Layer Exposed: No Necrotic Quality: Adherent Slough Tendon Exposed: No [place], [name] ([id number]) Muscle Exposed: No Joint Exposed: No Bone Exposed: No Limited to Skin Breakdown Periwound Skin Texture Texture Color No Abnormalities Noted: No No Abnormalities Noted: No Callus: No Atrophie Blanche: No Crepitus: No Cyanosis: No Excoriation: No Ecchymosis: No Fluctuance: No Erythema: No Friable: No Hemosiderin Staining: No Induration: No Mottled: No Localized Edema: No Pallor: No Rash: No Rubor: No Scarring: No Moisture No Abnormalities Noted: No Dry / Scaly: No Maceration: No Moist: No Wound Preparation Ulcer Cleansing: Rinsed/Irrigated with Saline Topical Anesthetic Applied: Other: lidocaine 4%, Treatment Notes Wound #2 (Medial Sacrum) 1. Cleansed with: Clean wound with Normal Saline 2. Anesthetic Topical Lidocaine 4% cream to wound bed prior to debridement 4. Dressing Applied: Aquacel Ag 5. Secondary Dressing Applied Bordered Foam Dressing Electronic Signature(s) Signed: 07/30/2015 5:20:03 PM  By: Montey Hora Entered By: Montey Hora on 07/30/2015 15:34:30 [place], [name]  ([id number]) -------------------------------------------------------------------------------- Vitals Details Patient Name: [name]. Date of Service: 07/30/2015 3:15 PM Medical Record Number: [id number] Patient Account Number: [id number] Date of Birth/Sex: [birth year] (44 y.o. Male) Treating RN: Montey Hora Primary Care Physician: Einar Pheasant Other Clinician: Referring Physician: Einar Pheasant Treating Physician/Extender: Frann Rider in Treatment: 1 Vital Signs Time Taken: 15:25 Temperature (F): 98.4 Height (in): 73 Pulse (bpm): 73 Weight (lbs): 170 Respiratory Rate (breaths/min): 18 Body Mass Index (BMI): 22.4 Blood Pressure (mmHg): 115/67 Reference Range: 80 - 120 mg / dl Electronic Signature(s) Signed: 07/30/2015 5:20:03 PM By: Montey Hora Entered By: Montey Hora on 07/30/2015 15:27:16

## 2015-08-09 ENCOUNTER — Encounter: Payer: No Typology Code available for payment source | Attending: Surgery | Admitting: Surgery

## 2015-08-09 DIAGNOSIS — L89153 Pressure ulcer of sacral region, stage 3: Secondary | ICD-10-CM | POA: Insufficient documentation

## 2015-08-09 DIAGNOSIS — Z794 Long term (current) use of insulin: Secondary | ICD-10-CM | POA: Diagnosis not present

## 2015-08-09 DIAGNOSIS — E10622 Type 1 diabetes mellitus with other skin ulcer: Secondary | ICD-10-CM | POA: Diagnosis not present

## 2015-08-09 DIAGNOSIS — G8250 Quadriplegia, unspecified: Secondary | ICD-10-CM | POA: Insufficient documentation

## 2015-08-09 NOTE — Progress Notes (Signed)
JACY, HOWAT ([id number]) Visit Report for 08/09/2015 Arrival Information Details Patient Name: [name], [name]. Date of Service: 08/09/2015 8:00 AM Medical Record Number: [id number] Patient Account Number: [id number] Date of Birth/Sex: 10-18-1971 (44 y.o. Male) Treating RN: [name] Gouty, RN, BSN, Velva Harman Primary Care Physician: Einar Pheasant Other Clinician: Referring Physician: Einar Pheasant Treating Physician/Extender: Frann Rider in Treatment: 2 Visit Information History Since Last Visit Any new allergies or adverse reactions: No Patient Arrived: Wheel Chair Had a fall or experienced change in No Arrival Time: 08:10 activities of daily living that may affect Accompanied By: self risk of falls: Transfer Assistance: None Signs or symptoms of abuse/neglect since last No Patient Identification Verified: Yes visito Secondary Verification Process Yes Has Dressing in Place as Prescribed: Yes Completed: Pain Present Now: No Patient Has Alerts: Yes Patient Alerts: Patient on Blood Thinner Type I Diabetic Electronic Signature(s) Signed: 08/09/2015 9:31:14 AM By: Regan Lemming BSN, RN Entered By: Regan Lemming on 08/09/2015 08:10:37 Purdy, [name] ([id number]) -------------------------------------------------------------------------------- Clinic Level of Care Assessment Details Patient Name: [name] Girt. Date of Service: 08/09/2015 8:00 AM Medical Record Number: [id number] Patient Account Number: [id number] Date of Birth/Sex: 1971-04-03 (44 y.o. Male) Treating RN: [name] Gouty, RN, BSN, [place] Primary Care Physician: Einar Pheasant Other Clinician: Referring Physician: Einar Pheasant Treating Physician/Extender: Frann Rider in Treatment: 2 Clinic Level of Care Assessment Items TOOL 4 Quantity Score []  - Use when only an EandM is performed on FOLLOW-UP visit 0 ASSESSMENTS - Nursing Assessment / Reassessment X - Reassessment of Co-morbidities (includes updates in patient  status) 1 10 X - Reassessment of Adherence to Treatment Plan 1 5 ASSESSMENTS - Wound and Skin Assessment / Reassessment []  - Simple Wound Assessment / Reassessment - one wound 0 []  - Complex Wound Assessment / Reassessment - multiple wounds 0 []  - Dermatologic / Skin Assessment (not related to wound area) 0 ASSESSMENTS - Focused Assessment []  - Circumferential Edema Measurements - multi extremities 0 []  - Nutritional Assessment / Counseling / Intervention 0 []  - Lower Extremity Assessment (monofilament, tuning fork, pulses) 0 []  - Peripheral Arterial Disease Assessment (using hand held doppler) 0 ASSESSMENTS - Ostomy and/or Continence Assessment and Care []  - Incontinence Assessment and Management 0 []  - Ostomy Care Assessment and Management (repouching, etc.) 0 PROCESS - Coordination of Care X - Simple Patient / Family Education for ongoing care 1 15 []  - Complex (extensive) Patient / Family Education for ongoing care 0 []  - Staff obtains Programmer, systems, Records, Test Results / Process Orders 0 []  - Staff telephones HHA, Nursing Homes / Clarify orders / etc 0 []  - Routine Transfer to another Facility (non-emergent condition) 0 Ku, [name] ([id number]) []  - Routine Hospital Admission (non-emergent condition) 0 []  - New Admissions / Biomedical engineer / Ordering NPWT, Apligraf, etc. 0 []  - Emergency Hospital Admission (emergent condition) 0 X - Simple Discharge Coordination 1 10 []  - Complex (extensive) Discharge Coordination 0 PROCESS - Special Needs []  - Pediatric / Minor Patient Management 0 []  - Isolation Patient Management 0 []  - Hearing / Language / Visual special needs 0 []  - Assessment of Community assistance (transportation, D/C planning, etc.) 0 []  - Additional assistance / Altered mentation 0 []  - Support Surface(s) Assessment (bed, cushion, seat, etc.) 0 INTERVENTIONS - Wound Cleansing / Measurement []  - Simple Wound Cleansing - one wound 0 []  - Complex Wound  Cleansing - multiple wounds 0 X - Wound Imaging (photographs - any number of wounds) 1 5 []  - Wound Tracing (instead  of photographs) 0 []  - Simple Wound Measurement - one wound 0 []  - Complex Wound Measurement - multiple wounds 0 INTERVENTIONS - Wound Dressings []  - Small Wound Dressing one or multiple wounds 0 []  - Medium Wound Dressing one or multiple wounds 0 []  - Large Wound Dressing one or multiple wounds 0 []  - Application of Medications - topical 0 []  - Application of Medications - injection 0 INTERVENTIONS - Miscellaneous []  - External ear exam 0 Halberg, [name] ([id number]) []  - Specimen Collection (cultures, biopsies, blood, body fluids, etc.) 0 []  - Specimen(s) / Culture(s) sent or taken to Lab for analysis 0 []  - Patient Transfer (multiple staff / Harrel Lemon Lift / Similar devices) 0 []  - Simple Staple / Suture removal (25 or less) 0 []  - Complex Staple / Suture removal (26 or more) 0 []  - Hypo / Hyperglycemic Management (close monitor of Blood Glucose) 0 []  - Ankle / Brachial Index (ABI) - do not check if billed separately 0 X - Vital Signs 1 5 Has the patient been seen at the hospital within the last three years: Yes Total Score: 50 Level Of Care: New/Established - Level 2 Electronic Signature(s) Signed: 08/09/2015 9:31:14 AM By: Regan Lemming BSN, RN Entered By: Regan Lemming on 08/09/2015 08:22:56 Baik, [name] ([id number]) -------------------------------------------------------------------------------- Encounter Discharge Information Details Patient Name: [name] Girt. Date of Service: 08/09/2015 8:00 AM Medical Record Number: [id number] Patient Account Number: [id number] Date of Birth/Sex: September 27, 1971 (44 y.o. Male) Treating RN: [name] Gouty, RN, BSN, Velva Harman Primary Care Physician: Einar Pheasant Other Clinician: Referring Physician: Einar Pheasant Treating Physician/Extender: Frann Rider in Treatment: 2 Encounter Discharge Information Items Discharge Pain Level:  0 Discharge Condition: Stable Ambulatory Status: Wheelchair Discharge Destination: Home Transportation: Private Auto Accompanied By: self Schedule Follow-up Appointment: No Medication Reconciliation completed No and provided to Patient/Care [name]: Patient Clinical Summary of Care: Declined Electronic Signature(s) Signed: 08/09/2015 9:29:04 AM By: Ruthine Dose Entered By: Ruthine Dose on 08/09/2015 09:29:04 Wiltsey, [name] ([id number]) -------------------------------------------------------------------------------- Lower Extremity Assessment Details Patient Name: [name] Girt. Date of Service: 08/09/2015 8:00 AM Medical Record Number: [id number] Patient Account Number: [id number] Date of Birth/Sex: November 03, 1971 (44 y.o. Male) Treating RN: [name] Gouty, RN, BSN, Velva Harman Primary Care Physician: Einar Pheasant Other Clinician: Referring Physician: Einar Pheasant Treating Physician/Extender: Frann Rider in Treatment: 2 Electronic Signature(s) Signed: 08/09/2015 9:31:14 AM By: Regan Lemming BSN, RN Entered By: Regan Lemming on 08/09/2015 08:11:36 Buhl, [name] ([id number]) -------------------------------------------------------------------------------- [place] Details Patient Name: [name] Girt. Date of Service: 08/09/2015 8:00 AM Medical Record Number: [id number] Patient Account Number: [id number] Date of Birth/Sex: [birth year] (44 y.o. Male) Treating RN: [name] Gouty, RN, BSN, Velva Harman Primary Care Physician: Einar Pheasant Other Clinician: Referring Physician: Einar Pheasant Treating Physician/Extender: Frann Rider in Treatment: 2 Active Inactive Electronic Signature(s) Signed: 08/09/2015 9:31:14 AM By: Regan Lemming BSN, RN Entered By: Regan Lemming on 08/09/2015 08:21:48 Langenfeld, [name] ([id number]) -------------------------------------------------------------------------------- Pain Assessment Details Patient Name: [name] Girt. Date of Service:  08/09/2015 8:00 AM Medical Record Number: [id number] Patient Account Number: [id number] Date of Birth/Sex: August 02, 1971 (44 y.o. Male) Treating RN: [name] Gouty, RN, BSN, Velva Harman Primary Care Physician: Einar Pheasant Other Clinician: Referring Physician: Einar Pheasant Treating Physician/Extender: Frann Rider in Treatment: 2 Active Problems Location of Pain Severity and Description of Pain Patient Has Paino No Site Locations Pain Management and Medication Current Pain Management: Electronic Signature(s) Signed: 08/09/2015 9:31:14 AM By: Regan Lemming BSN, RN Entered By: Regan Lemming on 08/09/2015 08:10:44 Mastrogiovanni, [name] ([id number]) --------------------------------------------------------------------------------  Patient/Caregiver Education Details Patient Name: [name], PEREZPEREZ. Date of Service: 08/09/2015 8:00 AM Medical Record Number: [id number] Patient Account Number: [id number] Date of Birth/Gender: 10-04-1971 (44 y.o. Male) Treating RN: [name] Gouty, RN, BSN, Velva Harman Primary Care Physician: Einar Pheasant Other Clinician: Referring Physician: Einar Pheasant Treating Physician/Extender: Frann Rider in Treatment: 2 Education Assessment Education Provided To: Patient Education Topics Provided Basic Hygiene: Methods: Explain/Verbal Responses: State content correctly Offloading: Methods: Explain/Verbal Responses: State content correctly Electronic Signature(s) Signed: 08/09/2015 9:31:14 AM By: Regan Lemming BSN, RN Entered By: Regan Lemming on 08/09/2015 08:24:09 Mantey, [name] ([id number]) -------------------------------------------------------------------------------- Wound Assessment Details Patient Name: [name] Girt. Date of Service: 08/09/2015 8:00 AM Medical Record Number: [id number] Patient Account Number: [id number] Date of Birth/Sex: 1971-04-16 (44 y.o. Male) Treating RN: [name] Gouty, RN, BSN, [place] Primary Care Physician: Einar Pheasant Other Clinician: Referring Physician:  Einar Pheasant Treating Physician/Extender: Frann Rider in Treatment: 2 Wound Status Wound Number: 2 Primary Pressure Ulcer Etiology: Wound Location: Sacrum - Medial Wound Healed - Epithelialized Wounding Event: Pressure Injury Status: Date Acquired: 06/06/2015 Comorbid Type I Diabetes, History of pressure Weeks Of Treatment: 2 History: wounds, Paraplegia Clustered Wound: No Photos Photo Uploaded By: Regan Lemming on 08/09/2015 12:28:29 Wound Measurements Length: (cm) 0 % Reduction in Width: (cm) 0 % Reduction in Depth: (cm) 0 Epithelializat Area: (cm) 0 Tunneling: Volume: (cm) 0 Undermining: Area: 100% Volume: 100% ion: Large (67-100%) No No Wound Description Classification: Category/Stage III Wound Margin: Thickened Exudate Amount: Small Exudate Type: Serous Exudate Color: amber Wound Bed Granulation Amount: None Present (0%) Exposed Structure Necrotic Amount: Small (1-33%) Fascia Exposed: No Necrotic Quality: Adherent Slough Fat Layer Exposed: No Tendon Exposed: No Murtaugh, [name] ([id number]) Muscle Exposed: No Joint Exposed: No Bone Exposed: No Limited to Skin Breakdown Periwound Skin Texture Texture Color No Abnormalities Noted: No No Abnormalities Noted: No Callus: No Atrophie Blanche: No Crepitus: No Cyanosis: No Excoriation: No Ecchymosis: No Fluctuance: No Erythema: No Friable: No Hemosiderin Staining: No Induration: No Mottled: No Localized Edema: No Pallor: No Rash: No Rubor: No Scarring: No Moisture No Abnormalities Noted: No Dry / Scaly: Yes Maceration: No Moist: No Wound Preparation Ulcer Cleansing: Rinsed/Irrigated with Saline Topical Anesthetic Applied: None Electronic Signature(s) Signed: 08/09/2015 9:31:14 AM By: Regan Lemming BSN, RN Entered By: Regan Lemming on 08/09/2015 08:20:13 Steen, [name] ([id number]) -------------------------------------------------------------------------------- Vitals  Details Patient Name: [name] Girt. Date of Service: 08/09/2015 8:00 AM Medical Record Number: [id number] Patient Account Number: [id number] Date of Birth/Sex: [birth year] (44 y.o. Male) Treating RN: Afful, RN, BSN, [place] Primary Care Physician: Einar Pheasant Other Clinician: Referring Physician: Einar Pheasant Treating Physician/Extender: Frann Rider in Treatment: 2 Vital Signs Time Taken: 08:11 Temperature (F): 97.9 Height (in): 73 Pulse (bpm): 66 Weight (lbs): 170 Respiratory Rate (breaths/min): 16 Body Mass Index (BMI): 22.4 Blood Pressure (mmHg): 119/61 Reference Range: 80 - 120 mg / dl Electronic Signature(s) Signed: 08/09/2015 9:31:14 AM By: Regan Lemming BSN, RN Entered By: Regan Lemming on 08/09/2015 08:11:32
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
## 2015-08-26 ENCOUNTER — Encounter: Payer: Self-pay | Admitting: Internal Medicine

## 2015-08-27 ENCOUNTER — Other Ambulatory Visit: Payer: Self-pay | Admitting: *Deleted

## 2015-08-27 MED ORDER — INSULIN ASPART 100 UNIT/ML ~~LOC~~ SOLN: SUBCUTANEOUS | Status: DC

## 2015-08-27 NOTE — Telephone Encounter (Signed)
Ok to refill.  Make sure has appt scheduled with me.  Refill until appt.

## 2015-09-17 ENCOUNTER — Other Ambulatory Visit: Payer: Self-pay | Admitting: Internal Medicine

## 2015-09-18 ENCOUNTER — Emergency Department: Payer: No Typology Code available for payment source

## 2015-09-18 ENCOUNTER — Other Ambulatory Visit: Payer: Self-pay | Admitting: *Deleted

## 2015-09-18 ENCOUNTER — Encounter: Payer: Self-pay | Admitting: Internal Medicine

## 2015-09-18 ENCOUNTER — Emergency Department
Admission: EM | Admit: 2015-09-18 | Discharge: 2015-09-18 | Disposition: A | Discharge status: Home / Self Care | Payer: No Typology Code available for payment source | Attending: Emergency Medicine | Admitting: Emergency Medicine

## 2015-09-18 ENCOUNTER — Ambulatory Visit
Admission: EM | Admit: 2015-09-18 | Discharge: 2015-09-18 | Disposition: A | Discharge status: Home / Self Care | Payer: 59 | Source: Home / Self Care | Attending: Family Medicine | Admitting: Family Medicine

## 2015-09-18 ENCOUNTER — Other Ambulatory Visit: Payer: Self-pay

## 2015-09-18 ENCOUNTER — Encounter: Payer: Self-pay | Admitting: Emergency Medicine

## 2015-09-18 DIAGNOSIS — R079 Chest pain, unspecified: Secondary | ICD-10-CM

## 2015-09-18 DIAGNOSIS — G8220 Paraplegia, unspecified: Secondary | ICD-10-CM | POA: Insufficient documentation

## 2015-09-18 DIAGNOSIS — E109 Type 1 diabetes mellitus without complications: Secondary | ICD-10-CM | POA: Insufficient documentation

## 2015-09-18 DIAGNOSIS — Z79899 Other long term (current) drug therapy: Secondary | ICD-10-CM | POA: Diagnosis not present

## 2015-09-18 DIAGNOSIS — Z794 Long term (current) use of insulin: Secondary | ICD-10-CM | POA: Diagnosis not present

## 2015-09-18 LAB — CBC
HEMATOCRIT: 37.2 % — AB (ref 40.0–52.0)
HEMOGLOBIN: 12.9 g/dL — AB (ref 13.0–18.0)
MCH: 32.3 pg (ref 26.0–34.0)
MCHC: 34.7 g/dL (ref 32.0–36.0)
MCV: 93 fL (ref 80.0–100.0)
Platelets: 207 10*3/uL (ref 150–440)
RBC: 4 MIL/uL — AB (ref 4.40–5.90)
RDW: 12.4 % (ref 11.5–14.5)
WBC: 7.3 10*3/uL (ref 3.8–10.6)

## 2015-09-18 LAB — BASIC METABOLIC PANEL
ANION GAP: 6 (ref 5–15)
BUN: 16 mg/dL (ref 6–20)
CHLORIDE: 96 mmol/L — AB (ref 101–111)
CO2: 26 mmol/L (ref 22–32)
CREATININE: 0.87 mg/dL (ref 0.61–1.24)
Calcium: 8.8 mg/dL — ABNORMAL LOW (ref 8.9–10.3)
GFR calc non Af Amer: >60 mL/min (ref >60)
Glucose, Bld: 278 mg/dL — ABNORMAL HIGH (ref 65–99)
POTASSIUM: 4.5 mmol/L (ref 3.5–5.1)
Sodium: 128 mmol/L — ABNORMAL LOW (ref 135–145)

## 2015-09-18 LAB — TROPONIN I: Troponin I: <0.03 ng/mL (ref <0.031)

## 2015-09-18 LAB — FIBRIN DERIVATIVES D-DIMER (ARMC ONLY): Fibrin derivatives D-dimer (ARMC): 300 (ref 0–499)

## 2015-09-18 MED ORDER — INSULIN LISPRO 100 UNIT/ML (KWIKPEN)
PEN_INJECTOR | SUBCUTANEOUS | Status: DC
Start: 2015-09-18 — End: 2017-04-28
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
## 2015-09-18 NOTE — ED Notes (Signed)
Pt states "I have had right side chest pain for more than a week, the pain comes pain comes and goes, since this pain started my blood sugars have been really high. An hour ago it was 385. I am a Type I diabetic. This morning my fasting sugar was 249, usually it is around 100."  Pt is chronically wheelchair bound, alert and stable.

## 2015-09-18 NOTE — ED Notes (Signed)
Call to [place] ED, informed of pt's story and that he is driving himself over to ED.

## 2015-09-18 NOTE — ED Notes (Signed)
Pt sent over from [facility] for further eval of chest pain

## 2015-09-18 NOTE — Discharge Instructions (Signed)
Is unclear what is causing her right-sided chest discomfort. You had a blood test that indicates the chance of blood clot is very very low. Her chest x-ray looked good. Your cardiac enzyme testing EKG looks good. Follow-up with regular doctor in the next 1-2 days. Return to the emergency department if you've increasing pain or other urgent concerns.  Nonspecific Chest Pain It is often hard to find the cause of chest pain. There is always a chance that your pain could be related to something serious, such as a heart attack or a blood clot in your lungs. Chest pain can also be caused by conditions that are not life-threatening. If you have chest pain, it is very important to follow up with your doctor.  HOME CARE  If you were prescribed an antibiotic medicine, finish it all even if you start to feel better.  Avoid any activities that cause chest pain.  Do not use any tobacco products, including cigarettes, chewing tobacco, or electronic cigarettes. If you need help quitting, ask your doctor.  Do not drink alcohol.  Take medicines only as told by your doctor.  Keep all follow-up visits as told by your doctor. This is important. This includes any further testing if your chest pain does not go away.  Your doctor may tell you to keep your head raised (elevated) while you sleep.  Make lifestyle changes as told by your doctor. These may include:  Getting regular exercise. Ask your doctor to suggest some activities that are safe for you.  Eating a heart-healthy diet. Your doctor or a diet specialist (dietitian) can help you to learn healthy eating options.  Maintaining a healthy weight.  Managing diabetes, if necessary.  Reducing stress. GET HELP IF:  Your chest pain does not go away, even after treatment.  You have a rash with blisters on your chest.  You have a fever. GET HELP RIGHT AWAY IF:  Your chest pain is worse.  You have an increasing cough, or you cough up blood.  You  have severe belly (abdominal) pain.  You feel extremely weak.  You pass out (faint).  You have chills.  You have sudden, unexplained chest discomfort.  You have sudden, unexplained discomfort in your arms, back, neck, or jaw.  You have shortness of breath at any time.  You suddenly start to sweat, or your skin gets clammy.  You feel nauseous.  You vomit.  You suddenly feel light-headed or dizzy.  Your heart begins to beat quickly, or it feels like it is skipping beats. These symptoms may be an emergency. Do not wait to see if the symptoms will go away. Get medical help right away. Call your local emergency services (911 in the [place] Do not drive yourself to the hospital.   This information is not intended to replace advice given to you by your health care provider. Make sure you discuss any questions you have with your health care provider.   Document Released: 05/11/2008 Document Revised: 12/14/2014 Document Reviewed: 06/29/2014 Elsevier Interactive Patient Education [employer].

## 2015-09-25 ENCOUNTER — Telehealth: Payer: Self-pay

## 2015-09-25 MED ORDER — BACLOFEN 20 MG PO TABS: ORAL_TABLET | ORAL | Status: DC

## 2015-09-25 NOTE — Telephone Encounter (Signed)
rx sent in 

## 2015-09-25 NOTE — Telephone Encounter (Signed)
Received a refill request for Baclofen. Last refilled 07/29/15 for #180 with 0 refills. Ok to refill?

## 2015-09-25 NOTE — Telephone Encounter (Signed)
Refill baclofen #180 with no refills.

## 2015-10-18 ENCOUNTER — Ambulatory Visit: Payer: No Typology Code available for payment source | Admitting: Internal Medicine

## 2015-12-20 ENCOUNTER — Encounter: Payer: Self-pay | Admitting: Internal Medicine

## 2015-12-20 ENCOUNTER — Ambulatory Visit (INDEPENDENT_AMBULATORY_CARE_PROVIDER_SITE_OTHER): Payer: 59 | Admitting: Internal Medicine

## 2015-12-20 VITALS — BP 90/60 | HR 66 | Temp 98.8°F | Resp 18 | Wt 175.0 lb

## 2015-12-20 DIAGNOSIS — G903 Multi-system degeneration of the autonomic nervous system: Secondary | ICD-10-CM

## 2015-12-20 DIAGNOSIS — Z23 Encounter for immunization: Secondary | ICD-10-CM

## 2015-12-20 DIAGNOSIS — D649 Anemia, unspecified: Secondary | ICD-10-CM | POA: Diagnosis not present

## 2015-12-20 DIAGNOSIS — Z87440 Personal history of urinary (tract) infections: Secondary | ICD-10-CM

## 2015-12-20 DIAGNOSIS — M6530 Trigger finger, unspecified finger: Secondary | ICD-10-CM

## 2015-12-20 DIAGNOSIS — E109 Type 1 diabetes mellitus without complications: Secondary | ICD-10-CM

## 2015-12-20 DIAGNOSIS — F1010 Alcohol abuse, uncomplicated: Secondary | ICD-10-CM

## 2015-12-20 LAB — BASIC METABOLIC PANEL
BUN: 12 mg/dL (ref 7–25)
CALCIUM: 9.2 mg/dL (ref 8.6–10.3)
CO2: 28 mmol/L (ref 20–31)
Chloride: 97 mmol/L — ABNORMAL LOW (ref 98–110)
Creat: 0.78 mg/dL (ref 0.60–1.35)
Glucose, Bld: 117 mg/dL — ABNORMAL HIGH (ref 65–99)
Potassium: 4.2 mmol/L (ref 3.5–5.3)
SODIUM: 132 mmol/L — AB (ref 135–146)

## 2015-12-20 LAB — HEPATIC FUNCTION PANEL
ALBUMIN: 3.7 g/dL (ref 3.6–5.1)
ALT: 14 U/L (ref 9–46)
AST: 18 U/L (ref 10–40)
Alkaline Phosphatase: 62 U/L (ref 40–115)
BILIRUBIN DIRECT: 0.1 mg/dL (ref <=0.2)
BILIRUBIN TOTAL: 0.4 mg/dL (ref 0.2–1.2)
Indirect Bilirubin: 0.3 mg/dL (ref 0.2–1.2)
Total Protein: 6 g/dL — ABNORMAL LOW (ref 6.1–8.1)

## 2015-12-20 LAB — HEMOGLOBIN A1C
Hgb A1c MFr Bld: 7.5 % — ABNORMAL HIGH (ref <5.7)
MEAN PLASMA GLUCOSE: 169 mg/dL — AB (ref <117)

## 2015-12-20 NOTE — Progress Notes (Signed)
Pre-visit discussion using our clinic review tool. No additional management support is needed unless otherwise documented below in the visit note.  

## 2015-12-21 LAB — CBC WITH DIFFERENTIAL/PLATELET
BASOS ABS: 0 10*3/uL (ref 0.0–0.1)
BASOS PCT: 0 % (ref 0–1)
EOS ABS: 0.1 10*3/uL (ref 0.0–0.7)
Eosinophils Relative: 1 % (ref 0–5)
HCT: 37.9 % — ABNORMAL LOW (ref 39.0–52.0)
HEMOGLOBIN: 13.2 g/dL (ref 13.0–17.0)
Lymphocytes Relative: 29 % (ref 12–46)
Lymphs Abs: 1.7 10*3/uL (ref 0.7–4.0)
MCH: 33.3 pg (ref 26.0–34.0)
MCHC: 34.8 g/dL (ref 30.0–36.0)
MCV: 95.7 fL (ref 78.0–100.0)
MPV: 9.3 fL (ref 8.6–12.4)
Monocytes Absolute: 0.5 10*3/uL (ref 0.1–1.0)
Monocytes Relative: 8 % (ref 3–12)
NEUTROS ABS: 3.6 10*3/uL (ref 1.7–7.7)
NEUTROS PCT: 62 % (ref 43–77)
PLATELETS: 216 10*3/uL (ref 150–400)
RBC: 3.96 MIL/uL — AB (ref 4.22–5.81)
RDW: 13 % (ref 11.5–15.5)
WBC: 5.8 10*3/uL (ref 4.0–10.5)

## 2015-12-21 LAB — TSH: TSH: 1.714 u[IU]/mL (ref 0.350–4.500)

## 2015-12-22 ENCOUNTER — Encounter: Payer: Self-pay | Admitting: Internal Medicine

## 2015-12-22 DIAGNOSIS — M6530 Trigger finger, unspecified finger: Secondary | ICD-10-CM | POA: Insufficient documentation

## 2015-12-22 NOTE — Assessment & Plan Note (Signed)
Has catheter.  Sees urology.  Stable.

## 2015-12-22 NOTE — Assessment & Plan Note (Signed)
Has had issues with low pressures.  Notices more in the am.  More frequent now.  70/40 average in am.  Worsened over the last month.  Saw cardiology.  Cardiology w/up unrevealing.  Concern over autonomic etiology.  Will also have brief episodes of elevated blood pressure.  Will have neurology evaluate.  Stay hydrated.
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
## 2015-12-22 NOTE — Assessment & Plan Note (Signed)
Needs a1c checked.  Brought in no sugar readings.  Adjust insulin dosing on his own.  Discussed compliance with diet.  On lantus and short actine insulin.  Check met b and a1c.  Keep up to date with eye checks.

## 2015-12-22 NOTE — Assessment & Plan Note (Signed)
Has had GI w/up.  Recheck cbc.

## 2015-12-22 NOTE — Assessment & Plan Note (Signed)
Right fourth trigger finger.  Worsened.  Increased pain.  Has been wearing a splint.  Refer to ortho.

## 2015-12-22 NOTE — Assessment & Plan Note (Signed)
Discussed with him today.  Discussed the need for decreased alcohol intake.

## 2015-12-27 ENCOUNTER — Other Ambulatory Visit: Payer: Self-pay | Admitting: Internal Medicine

## 2016-01-10 DIAGNOSIS — M6530 Trigger finger, unspecified finger: Secondary | ICD-10-CM | POA: Insufficient documentation

## 2016-01-23 ENCOUNTER — Encounter: Payer: Self-pay | Admitting: Internal Medicine

## 2016-01-28 NOTE — Telephone Encounter (Signed)
Please confirm with pt that he is taking 21 units of lantus at bedtime.  If so, contact pharmacy to change to levemir 21 units at bedtime - one month supply with 4 refills.  Have him monitor sugars and confirm stable.

## 2016-01-29 MED ORDER — INSULIN DETEMIR 100 UNIT/ML ~~LOC~~ SOLN: SUBCUTANEOUS | Status: DC

## 2016-01-29 NOTE — Telephone Encounter (Signed)
Sent in rx for 29 units q hs 39m with 2 refills.

## 2016-02-20 ENCOUNTER — Ambulatory Visit: Payer: 59 | Admitting: Internal Medicine

## 2016-02-28 ENCOUNTER — Encounter: Payer: Self-pay | Admitting: Internal Medicine

## 2016-02-28 ENCOUNTER — Ambulatory Visit (INDEPENDENT_AMBULATORY_CARE_PROVIDER_SITE_OTHER): Payer: Managed Care, Other (non HMO) | Admitting: Internal Medicine

## 2016-02-28 VITALS — BP 112/70 | HR 64 | Temp 98.4°F | Resp 18

## 2016-02-28 DIAGNOSIS — M6530 Trigger finger, unspecified finger: Secondary | ICD-10-CM

## 2016-02-28 DIAGNOSIS — R194 Change in bowel habit: Secondary | ICD-10-CM

## 2016-02-28 DIAGNOSIS — E109 Type 1 diabetes mellitus without complications: Secondary | ICD-10-CM

## 2016-02-28 DIAGNOSIS — G903 Multi-system degeneration of the autonomic nervous system: Secondary | ICD-10-CM | POA: Diagnosis not present

## 2016-02-28 DIAGNOSIS — R55 Syncope and collapse: Secondary | ICD-10-CM

## 2016-02-28 DIAGNOSIS — Z87440 Personal history of urinary (tract) infections: Secondary | ICD-10-CM

## 2016-02-28 DIAGNOSIS — F1010 Alcohol abuse, uncomplicated: Secondary | ICD-10-CM

## 2016-02-28 DIAGNOSIS — R198 Other specified symptoms and signs involving the digestive system and abdomen: Secondary | ICD-10-CM

## 2016-02-28 DIAGNOSIS — R532 Functional quadriplegia: Secondary | ICD-10-CM

## 2016-02-28 NOTE — Progress Notes (Signed)
Pre-visit discussion using our clinic review tool. No additional management support is needed unless otherwise documented below in the visit note.  
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
## 2016-03-01 ENCOUNTER — Encounter: Payer: Self-pay | Admitting: Internal Medicine

## 2016-03-01 NOTE — Assessment & Plan Note (Signed)
Has intermittent issues with low pressure.  See previous note for details.  On no medication for his pressure.  Saw cardiology.  Cardiac w/up unrevealing.  Concern over autonomic etiology.  Is scheduled to see neurology in June.

## 2016-03-01 NOTE — Assessment & Plan Note (Signed)
Discussed with him today.  Does drink less than previous.  Plans to decrease more.
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
## 2016-03-01 NOTE — Assessment & Plan Note (Signed)
Saw ortho.  S/p injection.  Better.

## 2016-03-01 NOTE — Assessment & Plan Note (Signed)
Has catheter.  Followed by urology.  Recently passed a stone.  Follow.

## 2016-03-01 NOTE — Assessment & Plan Note (Signed)
Has seen GI.  Up to date with colonoscopies.  Overall stable.

## 2016-03-01 NOTE — Assessment & Plan Note (Signed)
On insulin.  Brings in no recorded readings.  Does check his sugar multiple times per day.  Regulates his own insulin.  No recent issues with low blood sugars.  Follow met b and a1c.

## 2016-03-01 NOTE — Assessment & Plan Note (Signed)
Previous occurrence.  Has intermittent episodes where blood pressure drops.  Had negative cardiac w/up.  Did not tolerate florinef.  Is better currently.  Hold on any further medication at this time.  Keep neurology appt in June.

## 2016-05-07 ENCOUNTER — Encounter: Payer: Self-pay | Admitting: Internal Medicine

## 2016-05-15 DIAGNOSIS — G904 Autonomic dysreflexia: Secondary | ICD-10-CM | POA: Insufficient documentation

## 2016-05-28 ENCOUNTER — Encounter: Payer: Managed Care, Other (non HMO) | Attending: Surgery | Admitting: Surgery

## 2016-05-28 DIAGNOSIS — L290 Pruritus ani: Secondary | ICD-10-CM | POA: Diagnosis not present

## 2016-05-28 DIAGNOSIS — E10622 Type 1 diabetes mellitus with other skin ulcer: Secondary | ICD-10-CM | POA: Diagnosis present

## 2016-05-28 DIAGNOSIS — Z8249 Family history of ischemic heart disease and other diseases of the circulatory system: Secondary | ICD-10-CM | POA: Diagnosis not present

## 2016-05-28 DIAGNOSIS — F1010 Alcohol abuse, uncomplicated: Secondary | ICD-10-CM | POA: Diagnosis not present

## 2016-05-28 DIAGNOSIS — Z794 Long term (current) use of insulin: Secondary | ICD-10-CM | POA: Insufficient documentation

## 2016-05-28 DIAGNOSIS — G8254 Quadriplegia, C5-C7 incomplete: Secondary | ICD-10-CM | POA: Diagnosis not present

## 2016-05-28 DIAGNOSIS — I10 Essential (primary) hypertension: Secondary | ICD-10-CM | POA: Diagnosis not present

## 2016-05-28 NOTE — Progress Notes (Signed)
[name], [name] ([id number]) Visit Report for 05/28/2016 Abuse/Suicide Risk Screen Details Patient Name: [name], [name]. Date of Service: 05/28/2016 2:15 PM Medical Record Number: [id number] Patient Account Number: [id number] Date of Birth/Sex: Sep 29, 1971 (45 y.o. Male) Treating RN: [name] Gouty, RN, BSN, Velva Harman Primary Care Physician: Einar Pheasant Other Clinician: Referring Physician: Einar Pheasant Treating Physician/Extender: Frann Rider in Treatment: 0 Abuse/Suicide Risk Screen Items Answer ABUSE/SUICIDE RISK SCREEN: Has anyone close to you tried to hurt or harm you recentlyo No Do you feel uncomfortable with anyone in your familyo No Has anyone forced you do things that you didnot want to doo No Do you have any thoughts of harming yourselfo No Patient displays signs or symptoms of abuse and/or neglect. No Electronic Signature(s) Signed: 05/28/2016 2:21:49 PM By: Regan Lemming BSN, RN Entered By: Regan Lemming on 05/28/2016 14:21:48 [place], [name] ([id number]) -------------------------------------------------------------------------------- Activities of Daily Living Details Patient Name: [name]. Date of Service: 05/28/2016 2:15 PM Medical Record Number: [id number] Patient Account Number: [id number] Date of Birth/Sex: 1971-04-23 (46 y.o. Male) Treating RN: [name] Gouty, RN, BSN, Velva Harman Primary Care Physician: Einar Pheasant Other Clinician: Referring Physician: Einar Pheasant Treating Physician/Extender: Frann Rider in Treatment: 0 Activities of Daily Living Items Answer Activities of Daily Living (Please select one for each item) Drive Automobile Need Assistance Take Medications Completely Able Use Telephone Completely [place] for Appearance Completely Able Use Toilet Need Assistance Bath / Shower Need Assistance Dress Self Need Assistance Feed Self Completely Able Walk Not Able Get In / Out Bed Need Assistance Housework Need Assistance Prepare Meals Need  Assistance Handle Money Need Assistance Shop for Self Need Assistance Electronic Signature(s) Signed: 05/28/2016 2:22:29 PM By: Regan Lemming BSN, RN Entered By: Regan Lemming on 05/28/2016 14:22:29 Rez[place], [name] ([id number]) -------------------------------------------------------------------------------- Education Assessment Details Patient Name: [name]. Date of Service: 05/28/2016 2:15 PM Medical Record Number: [id number] Patient Account Number: [id number] Date of Birth/Sex: 04/23/1971 (45 y.o. Male) Treating RN: [name] Gouty, RN, BSN, Velva Harman Primary Care Physician: Einar Pheasant Other Clinician: Referring Physician: Einar Pheasant Treating Physician/Extender: Frann Rider in Treatment: 0 Primary Learner Assessed: Patient Learning Preferences/Education Level/Primary Language Learning Preference: Explanation Highest Education Level: College or Above Preferred Language: English Cognitive Barrier Assessment/Beliefs Language Barrier: No Physical Barrier Assessment Impaired Vision: No Impaired Hearing: No Decreased Hand dexterity: No Knowledge/Comprehension Assessment Knowledge Level: High Comprehension Level: High Ability to understand written High instructions: Ability to understand verbal High instructions: Motivation Assessment Anxiety Level: Calm Cooperation: Cooperative Education Importance: Acknowledges Need Interest in Health Problems: Asks Questions Perception: Coherent Willingness to Engage in Self- High Management Activities: Readiness to Engage in Self- High Management Activities: Electronic Signature(s) Signed: 05/28/2016 2:22:58 PM By: Regan Lemming BSN, RN Entered By: Regan Lemming on 05/28/2016 14:22:58 Boesch, [name] ([id number]) -------------------------------------------------------------------------------- Fall Risk Assessment Details Patient Name: [name]. Date of Service: 05/28/2016 2:15 PM Medical Record Number: [id number] Patient  Account Number: [id number] Date of Birth/Sex: 07/02/1971 (45 y.o. Male) Treating RN: [name] Gouty, RN, BSN, [place] Primary Care Physician: Einar Pheasant Other Clinician: Referring Physician: Einar Pheasant Treating Physician/Extender: Frann Rider in Treatment: 0 Fall Risk Assessment Items Have you had 2 or more falls in the last 12 monthso 0 No Have you had any fall that resulted in injury in the last 12 monthso 0 No FALL RISK ASSESSMENT: History of falling - immediate or within 3 months 0 No Secondary diagnosis 0 No Ambulatory aid None/bed rest/wheelchair/nurse 0 Yes Crutches/cane/walker 0 No Furniture 0 No IV Access/Saline Lock 0  No Gait/Training Normal/bed rest/immobile 0 Yes Weak 10 Yes Impaired 20 Yes Mental Status Oriented to own ability 0 Yes Electronic Signature(s) Signed: 05/28/2016 2:23:15 PM By: Regan Lemming BSN, RN Entered By: Regan Lemming on 05/28/2016 14:23:15 [place], [name] ([id number]) -------------------------------------------------------------------------------- Foot Assessment Details Patient Name: [name]. Date of Service: 05/28/2016 2:15 PM Medical Record Number: [id number] Patient Account Number: [id number] Date of Birth/Sex: 05-27-1971 (45 y.o. Male) Treating RN: [name] Gouty, RN, BSN, Velva Harman Primary Care Physician: Einar Pheasant Other Clinician: Referring Physician: Einar Pheasant Treating Physician/Extender: Frann Rider in Treatment: 0 Foot Assessment Items Site Locations + = Sensation present, - = Sensation absent, C = Callus, U = Ulcer R = Redness, W = Warmth, M = Maceration, PU = Pre-ulcerative lesion F = Fissure, S = Swelling, D = Dryness Assessment Right: Left: Other Deformity: No No Prior Foot Ulcer: No No Prior Amputation: No No Charcot Joint: No No Ambulatory Status: Non-ambulatory Assistance Device: Wheelchair Gait: Administrator, arts) Signed: 05/28/2016 2:23:43 PM By: Regan Lemming BSN, RN Entered By: Regan Lemming on 05/28/2016 14:23:42 Becka, [name] ([id number]) -------------------------------------------------------------------------------- Nutrition Risk Assessment Details Patient Name: [name]. Date of Service: 05/28/2016 2:15 PM Medical Record Number: [id number] Patient Account Number: [id number] Date of Birth/Sex: 1971/02/05 (45 y.o. Male) Treating RN: [name] Gouty, RN, BSN, [place] Primary Care Physician: Einar Pheasant Other Clinician: Referring Physician: Einar Pheasant Treating Physician/Extender: Frann Rider in Treatment: 0 Height (in): 73 Weight (lbs): 170 Body Mass Index (BMI): 22.4 Nutrition Risk Assessment Items NUTRITION RISK SCREEN: I have an illness or condition that made me change the kind and/or 0 No amount of food I eat I eat fewer than two meals per day 0 No I eat few fruits and vegetables, or milk products 0 No I have three or more drinks of beer, liquor or wine almost every day 0 No I have tooth or mouth problems that make it hard for me to eat 0 No I don't always have enough money to buy the food I need 0 No I eat alone most of the time 0 No I take three or more different prescribed or over-the-counter drugs a 0 No day Without wanting to, I have lost or gained 10 pounds in the last six 0 No months I am not always physically able to shop, cook and/or feed myself 0 No Nutrition Protocols Good Risk Protocol 0 No interventions needed Moderate Risk Protocol Electronic Signature(s) Signed: 05/28/2016 2:23:34 PM By: Regan Lemming BSN, RN Entered By: Regan Lemming on 05/28/2016 14:23:33

## 2016-05-28 NOTE — Progress Notes (Addendum)
[name], [name] ([id number]) Visit Report for 05/28/2016 Allergy List Details Patient Name: [name], [name]. Date of Service: 05/28/2016 2:15 PM Medical Record Number: [id number] Patient Account Number: [id number] Date of Birth/Sex: January 09, 1971 (45 y.o. Male) Treating RN: [name] Gouty, RN, BSN, Velva Harman Primary Care Physician: Einar Pheasant Other Clinician: Referring Physician: Einar Pheasant Treating Physician/Extender: Frann Rider in Treatment: 0 Allergies Active Allergies Sulfa (Sulfonamide Antibiotics) Reaction: tongue swells Iodinated Contrast Media - IV Dye decongestant Allergy Notes Electronic Signature(s) Signed: 05/28/2016 2:20:10 PM By: Regan Lemming BSN, RN Entered By: Regan Lemming on 05/28/2016 14:20:10 Bhakta, [name] ([id number]) -------------------------------------------------------------------------------- Arrival Information Details Patient Name: [name]. Date of Service: 05/28/2016 2:15 PM Medical Record Number: [id number] Patient Account Number: [id number] Date of Birth/Sex: 10-Nov-1971 (45 y.o. Male) Treating RN: [name] Gouty, RN, BSN, Velva Harman Primary Care Physician: Einar Pheasant Other Clinician: Referring Physician: Einar Pheasant Treating Physician/Extender: Frann Rider in Treatment: 0 Visit Information Patient Arrived: Wheel Chair Arrival Time: 14:19 Accompanied By: self Transfer Assistance: None Patient Identification Verified: Yes Secondary Verification Process Yes Completed: Patient Requires Transmission-Based No Precautions: Patient Has Alerts: No History Since Last Visit Added or deleted any medications: No Any new allergies or adverse reactions: No Had a fall or experienced change in activities of daily living that may affect risk of falls: No Signs or symptoms of abuse/neglect since last visito No Electronic Signature(s) Signed: 05/28/2016 5:30:03 PM By: Regan Lemming BSN, RN Previous Signature: 05/28/2016 2:19:58 PM Version By: Regan Lemming BSN, RN Entered By: Regan Lemming on 05/28/2016 15:10:46 Omar, [name] ([id number]) -------------------------------------------------------------------------------- Clinic Level of Care Assessment Details Patient Name: [name]. Date of Service: 05/28/2016 2:15 PM Medical Record Number: [id number] Patient Account Number: [id number] Date of Birth/Sex: 06/05/1971 (45 y.o. Male) Treating RN: [name] Gouty, RN, BSN, [place] Primary Care Physician: Einar Pheasant Other Clinician: Referring Physician: Einar Pheasant Treating Physician/Extender: Frann Rider in Treatment: 0 Clinic Level of Care Assessment Items TOOL 4 Quantity Score []  - Use when only an EandM is performed on FOLLOW-UP visit 0 ASSESSMENTS - Nursing Assessment / Reassessment X - Reassessment of Co-morbidities (includes updates in patient status) 1 10 X - Reassessment of Adherence to Treatment Plan 1 5 ASSESSMENTS - Wound and Skin Assessment / Reassessment X - Simple Wound Assessment / Reassessment - one wound 1 5 []  - Complex Wound Assessment / Reassessment - multiple wounds 0 []  - Dermatologic / Skin Assessment (not related to wound area) 0 ASSESSMENTS - Focused Assessment []  - Circumferential Edema Measurements - multi extremities 0 []  - Nutritional Assessment / Counseling / Intervention 0 []  - Lower Extremity Assessment (monofilament, tuning fork, pulses) 0 []  - Peripheral Arterial Disease Assessment (using hand held doppler) 0 ASSESSMENTS - Ostomy and/or Continence Assessment and Care []  - Incontinence Assessment and Management 0 []  - Ostomy Care Assessment and Management (repouching, etc.) 0 PROCESS - Coordination of Care X - Simple Patient / Family Education for ongoing care 1 15 []  - Complex (extensive) Patient / Family Education for ongoing care 0 []  - Staff obtains Programmer, systems, Records, Test Results / Process Orders 0 []  - Staff telephones HHA, Nursing Homes / Clarify orders / etc 0 []  - Routine Transfer to  another Facility (non-emergent condition) 0 [place], [name] ([id number]) []  - Routine Hospital Admission (non-emergent condition) 0 X - New Admissions / Biomedical engineer / Ordering NPWT, Apligraf, etc. 1 15 []  - Emergency Hospital Admission (emergent condition) 0 []  - Simple Discharge Coordination 0 []  - Complex (extensive) Discharge Coordination 0  PROCESS - Special Needs []  - Pediatric / Minor Patient Management 0 []  - Isolation Patient Management 0 []  - Hearing / Language / Visual special needs 0 []  - Assessment of Community assistance (transportation, D/C planning, etc.) 0 []  - Additional assistance / Altered mentation 0 []  - Support Surface(s) Assessment (bed, cushion, seat, etc.) 0 INTERVENTIONS - Wound Cleansing / Measurement X - Simple Wound Cleansing - one wound 1 5 []  - Complex Wound Cleansing - multiple wounds 0 X - Wound Imaging (photographs - any number of wounds) 1 5 []  - Wound Tracing (instead of photographs) 0 X - Simple Wound Measurement - one wound 1 5 []  - Complex Wound Measurement - multiple wounds 0 INTERVENTIONS - Wound Dressings X - Small Wound Dressing one or multiple wounds 1 10 []  - Medium Wound Dressing one or multiple wounds 0 []  - Large Wound Dressing one or multiple wounds 0 []  - Application of Medications - topical 0 []  - Application of Medications - injection 0 INTERVENTIONS - Miscellaneous []  - External ear exam 0 Hsu, [name] ([id number]) []  - Specimen Collection (cultures, biopsies, blood, body fluids, etc.) 0 []  - Specimen(s) / Culture(s) sent or taken to Lab for analysis 0 []  - Patient Transfer (multiple staff / Harrel Lemon Lift / Similar devices) 0 []  - Simple Staple / Suture removal (25 or less) 0 []  - Complex Staple / Suture removal (26 or more) 0 []  - Hypo / Hyperglycemic Management (close monitor of Blood Glucose) 0 []  - Ankle / Brachial Index (ABI) - do not check if billed separately 0 X - Vital Signs 1 5 Has the patient been seen  at the hospital within the last three years: Yes Total Score: 80 Level Of Care: New/Established - Level 3 Electronic Signature(s) Signed: 05/28/2016 5:30:03 PM By: Regan Lemming BSN, RN Entered By: Regan Lemming on 05/28/2016 15:09:13 Feil, [name] ([id number]) -------------------------------------------------------------------------------- Encounter Discharge Information Details Patient Name: [name]. Date of Service: 05/28/2016 2:15 PM Medical Record Number: [id number] Patient Account Number: [id number] Date of Birth/Sex: [birth year] (45 y.o. Male) Treating RN: [name] Gouty, RN, BSN, Velva Harman Primary Care Physician: Einar Pheasant Other Clinician: Referring Physician: Einar Pheasant Treating Physician/Extender: Frann Rider in Treatment: 0 Encounter Discharge Information Items Discharge Pain Level: 0 Discharge Condition: Stable Ambulatory Status: Wheelchair Discharge Destination: Home Transportation: Private Auto Accompanied By: self Schedule Follow-up Appointment: No Medication Reconciliation completed and provided to Patient/Care No Alyza Artiaga: Provided on Clinical Summary of Care: 05/28/2016 Form Type Recipient Paper Patient SR Electronic Signature(s) Signed: 05/28/2016 5:30:03 PM By: Regan Lemming BSN, RN Previous Signature: 05/28/2016 3:07:02 PM Version By: Ruthine Dose Entered By: Regan Lemming on 05/28/2016 15:10:11 Osseo, [name] ([id number]) -------------------------------------------------------------------------------- Lower Extremity Assessment Details Patient Name: [name]. Date of Service: 05/28/2016 2:15 PM Medical Record Number: [id number] Patient Account Number: [id number] Date of Birth/Sex: Nov 01, 1971 (45 y.o. Male) Treating RN: [name] Gouty, RN, BSN, Velva Harman Primary Care Physician: Einar Pheasant Other Clinician: Referring Physician: Einar Pheasant Treating Physician/Extender: Frann Rider in Treatment: 0 Electronic Signature(s) Signed: 05/28/2016  2:23:58 PM By: Regan Lemming BSN, RN Entered By: Regan Lemming on 05/28/2016 14:23:57 [place], [name] ([id number]) -------------------------------------------------------------------------------- Multi Wound Chart Details Patient Name: [name]. Date of Service: 05/28/2016 2:15 PM Medical Record Number: [id number] Patient Account Number: [id number] Date of Birth/Sex: 03/13/1971 (45 y.o. 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] [N/A:N/A] Comorbid History: [3:Type I Diabetes, History of N/A pressure wounds, Paraplegia] Date Acquired: [3:04/29/2016] [N/A:N/A] Weeks of Treatment: [3:0] [N/A:N/A] Wound Status: [3:Open] [N/A:N/A] Measurements L x W x D 1x3x0.1 [N/A:N/A] (cm) Area (cm) : [3:2.356] [N/A:N/A] Volume (cm) : [3:0.236] [N/A:N/A] Classification: [3:Category/Stage II] [N/A:N/A] Exudate Amount: [3:Medium] [N/A:N/A] Exudate Type: [3:Serosanguineous] [N/A:N/A] Exudate Color: [3:red, brown] [N/A:N/A] Wound Margin: [3:Distinct, outline attached N/A] Granulation Amount: [3:Medium (34-66%)] [N/A:N/A] Granulation Quality: [3:Pink, Pale] [N/A:N/A] Necrotic Amount: [3:Medium (34-66%)] [N/A:N/A] Exposed Structures: [3:Fascia: No Fat: No Tendon: No Muscle: No Joint: No Bone: No Limited to Skin Breakdown] [N/A:N/A] Epithelialization: None N/A N/A Periwound Skin Texture: Edema: No N/A N/A Excoriation: No Induration: No Callus: No Crepitus: No Fluctuance: No Friable: No Rash: No Scarring:  No Periwound Skin Moist: Yes N/A N/A Moisture: Maceration: No Dry/Scaly: No Periwound Skin Color: Atrophie Blanche: No N/A N/A Cyanosis: No Ecchymosis: No Erythema: No Hemosiderin Staining: No Mottled: No Pallor: No Rubor: No Temperature: No Abnormality N/A N/A Tenderness on No N/A N/A Palpation: Wound Preparation: Ulcer Cleansing: N/A N/A Rinsed/Irrigated with Saline Topical Anesthetic Applied: Other: lidocaine 4% Treatment Notes Electronic Signature(s) Signed: 05/28/2016 5:30:03 PM By: Regan Lemming BSN, RN Entered By: Regan Lemming on 05/28/2016 14:57:01 Shain, [name] ([id number]) -------------------------------------------------------------------------------- [place] Details Patient Name: [name]. Date of Service: 05/28/2016 2:15 PM Medical Record Number: [id number] Patient Account Number: [id number] Date of Birth/Sex: 08/20/1971 (45 y.o. Male) Treating RN: [name] Gouty, RN, BSN, Velva Harman Primary Care Physician: Einar Pheasant Other Clinician: Referring Physician: Einar Pheasant Treating Physician/Extender: Frann Rider in Treatment: 0 Active Inactive Electronic Signature(s) Signed: 06/22/2016 4:44:16 PM By: Gretta Cool RN, BSN, Kim RN, BSN Signed: 08/04/2016 3:27:27 PM By: Regan Lemming BSN, RN Previous Signature: 05/28/2016 5:30:03 PM Version By: Regan Lemming BSN, RN Entered By: Gretta Cool, RN, BSN, Kim on 06/04/2016 16:47:33 Raymond, [name] ([id number]) -------------------------------------------------------------------------------- Pain Assessment Details Patient Name: [name], [name]. Date of Service: 05/28/2016 2:15 PM Medical Record Number: [id number] Patient Account Number: [id number] Date of Birth/Sex: [birth year] (45 y.o. Male) Treating RN: [name] Gouty, RN, BSN, Velva Harman Primary Care Physician: Einar Pheasant Other Clinician: Referring Physician: Einar Pheasant Treating Physician/Extender: Frann Rider in Treatment: 0 Active Problems Location  of Pain Severity and Description of Pain Patient Has Paino Yes Site Locations Pain Location: Pain in Ulcers Rate the pain. Current Pain Level: 6 Character of Pain Describe the Pain: Aching, Tender Pain Management and Medication Current Pain Management: Medication: Yes Rest: Yes How does your pain impact your activities of daily livingo Sleep: Yes Bathing: Yes Appetite: Yes Relationship With Others: Yes Bladder Continence: Yes Emotions: Yes Bowel Continence: Yes Work: Yes Toileting: Yes Drive: Yes Dressing: Yes Hobbies: Yes Engineer, maintenance) Signed: 05/28/2016 5:30:03 PM By: Regan Lemming BSN, RN Entered By: Regan Lemming on 05/28/2016 14:29:28 Heiss, [name] ([id number]) -------------------------------------------------------------------------------- Patient/Caregiver Education Details Patient Name: [name]. Date of Service: 05/28/2016 2:15 PM Medical Record Number: [id number] Patient Account Number: [id number] Date of Birth/Gender: 11-04-1971 (45 y.o. Male) Treating RN: [name] Gouty, RN, BSN, Velva Harman Primary Care Physician: Einar Pheasant Other Clinician: Referring Physician: Einar Pheasant Treating Physician/Extender: Frann Rider in Treatment: 0 Education Assessment Education Provided To: Patient Education Topics Provided Pressure: Methods: Explain/Verbal Responses: State content correctly Welcome To [facility] [place]: Methods: Explain/Verbal Responses: State content correctly Wound/Skin Impairment: Methods:  Explain/Verbal Responses: State content correctly Electronic Signature(s) Signed: 05/28/2016 5:30:03 PM By: Regan Lemming BSN, RN Entered By: Regan Lemming on 05/28/2016 15:10:31 Strawder, [name] ([id number]) -------------------------------------------------------------------------------- Wound Assessment Details Patient Name: [name]. Date of Service: 05/28/2016 2:15 PM Medical Record Number: [id number] Patient Account Number:  [id number] Date of Birth/Sex: 04-26-1971 (45 y.o. Male) Treating RN: [name] Gouty, RN, BSN, Velva Harman Primary Care Physician: Einar Pheasant Other Clinician: Referring Physician: Einar Pheasant Treating Physician/Extender: Frann Rider in Treatment: 0 Wound Status Wound Number: 3 Primary Pressure Ulcer Etiology: Wound Location: Peri-anal Wound Open Wounding Event: Pressure Injury Status: Date Acquired: 04/29/2016 Comorbid Type I Diabetes, History of pressure Weeks Of Treatment: 0 History: wounds, Paraplegia Clustered Wound: No Photos Photo Uploaded By: Regan Lemming on 05/28/2016 17:28:13 Wound Measurements Length: (cm) 1 Width: (cm) 3 Depth: (cm) 0.1 Area: (cm) 2.356 Volume: (cm) 0.236 % Reduction in Area: % Reduction in Volume: Epithelialization: None Tunneling: No Undermining: No Wound Description Classification: Category/Stage II Wound Margin: Distinct, outline attached Exudate Amount: Medium Exudate Type: Serosanguineous Exudate Color: red, brown Foul Odor After Cleansing: No Wound Bed Granulation Amount: Medium (34-66%) Exposed Structure Granulation Quality: Pink, Pale Fascia Exposed: No Necrotic Amount: Medium (34-66%) Fat Layer Exposed: No Necrotic Quality: Adherent Slough Tendon Exposed: No Morath, [name] ([id number]) Muscle Exposed: No Joint Exposed: No Bone Exposed: No Limited to Skin Breakdown Periwound Skin Texture Texture Color No Abnormalities Noted: No No Abnormalities Noted: No Callus: No Atrophie Blanche: No Crepitus: No Cyanosis: No Excoriation: No Ecchymosis: No Fluctuance: No Erythema: No Friable: No Hemosiderin Staining: No Induration: No Mottled: No Localized Edema: No Pallor: No Rash: No Rubor: No Scarring: No Temperature / Pain Moisture Temperature: No Abnormality No Abnormalities Noted: No Dry / Scaly: No Maceration: No Moist: Yes Wound Preparation Ulcer Cleansing: Rinsed/Irrigated with Saline Topical Anesthetic  Applied: Other: lidocaine 4%, Electronic Signature(s) Signed: 05/28/2016 2:39:34 PM By: Regan Lemming BSN, RN Entered By: Regan Lemming on 05/28/2016 14:39:33 Desena, [name] ([id number]) -------------------------------------------------------------------------------- Vitals Details Patient Name: [name]. Date of Service: 05/28/2016 2:15 PM Medical Record Number: [id number] Patient Account Number: [id number] Date of Birth/Sex: Sep 18, 1971 (45 y.o. Male) Treating RN: [name] Gouty, RN, BSN, [place] Primary Care Physician: Einar Pheasant Other Clinician: Referring Physician: Einar Pheasant Treating Physician/Extender: Frann Rider in Treatment: 0 Vital Signs Time Taken: 14:29 Temperature (F): 98.0 Height (in): 72 Pulse (bpm): 64 Source: Stated Respiratory Rate (breaths/min): 16 Weight (lbs): 170 Blood Pressure (mmHg): 240/122 Source: Stated Reference Range: 80 - 120 mg / dl Body Mass Index (BMI): 23.1 Notes MD ma Electronic Signature(s) Signed: 05/28/2016 5:30:03 PM By: Regan Lemming BSN, RN Entered By: Regan Lemming on 05/28/2016 14:31:51
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
## 2016-06-02 ENCOUNTER — Encounter: Payer: Self-pay | Admitting: Internal Medicine

## 2016-06-03 ENCOUNTER — Encounter: Payer: Self-pay | Admitting: General Surgery

## 2016-06-03 ENCOUNTER — Ambulatory Visit (INDEPENDENT_AMBULATORY_CARE_PROVIDER_SITE_OTHER): Payer: Managed Care, Other (non HMO) | Admitting: General Surgery

## 2016-06-03 ENCOUNTER — Other Ambulatory Visit: Payer: Self-pay

## 2016-06-03 VITALS — BP 106/58 | HR 58 | Resp 16 | Ht 72.0 in | Wt 170.0 lb

## 2016-06-03 DIAGNOSIS — K644 Residual hemorrhoidal skin tags: Secondary | ICD-10-CM

## 2016-06-03 DIAGNOSIS — L98491 Non-pressure chronic ulcer of skin of other sites limited to breakdown of skin: Secondary | ICD-10-CM

## 2016-06-03 DIAGNOSIS — G8220 Paraplegia, unspecified: Secondary | ICD-10-CM | POA: Diagnosis not present

## 2016-06-03 DIAGNOSIS — K648 Other hemorrhoids: Secondary | ICD-10-CM | POA: Diagnosis not present
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
## 2016-06-03 NOTE — Patient Instructions (Addendum)
The patient is aware to call back for any questions or concerns. Hold propranolol. Recommend hemorrhoid stapling.  Hemorrhoids Hemorrhoids are swollen veins around the rectum or anus. There are two types of hemorrhoids:   Internal hemorrhoids. These occur in the veins just inside the rectum. They may poke through to the outside and become irritated and painful.  External hemorrhoids. These occur in the veins outside the anus and can be felt as a painful swelling or hard lump near the anus. CAUSES  Pregnancy.   Obesity.   Constipation or diarrhea.   Straining to have a bowel movement.   Sitting for long periods on the toilet.  Heavy lifting or other activity that caused you to strain.  Anal intercourse. SYMPTOMS   Pain.   Anal itching or irritation.   Rectal bleeding.   Fecal leakage.   Anal swelling.   One or more lumps around the anus.  DIAGNOSIS  Your caregiver may be able to diagnose hemorrhoids by visual examination. Other examinations or tests that may be performed include:   Examination of the rectal area with a gloved hand (digital rectal exam).   Examination of anal canal using a small tube (scope).   A blood test if you have lost a significant amount of blood.  A test to look inside the colon (sigmoidoscopy or colonoscopy). TREATMENT Most hemorrhoids can be treated at home. However, if symptoms do not seem to be getting better or if you have a lot of rectal bleeding, your caregiver may perform a procedure to help make the hemorrhoids get smaller or remove them completely. Possible treatments include:   Placing a rubber band at the base of the hemorrhoid to cut off the circulation (rubber band ligation).   Injecting a chemical to shrink the hemorrhoid (sclerotherapy).   Using a tool to burn the hemorrhoid (infrared light therapy).   Surgically removing the hemorrhoid (hemorrhoidectomy).   Stapling the hemorrhoid to block blood flow to  the tissue (hemorrhoid stapling).  HOME CARE INSTRUCTIONS   Eat foods with fiber, such as whole grains, beans, nuts, fruits, and vegetables. Ask your doctor about taking products with added fiber in them (fibersupplements).  Increase fluid intake. Drink enough water and fluids to keep your urine clear or pale yellow.   Exercise regularly.   Go to the bathroom when you have the urge to have a bowel movement. Do not wait.   Avoid straining to have bowel movements.   Keep the anal area dry and clean. Use wet toilet paper or moist towelettes after a bowel movement.   Medicated creams and suppositories may be used or applied as directed.   Only take over-the-counter or prescription medicines as directed by your caregiver.   Take warm sitz baths for 15-20 minutes, 3-4 times a day to ease pain and discomfort.   Place ice packs on the hemorrhoids if they are tender and swollen. Using ice packs between sitz baths may be helpful.   Put ice in a plastic bag.   Place a towel between your skin and the bag.   Leave the ice on for 15-20 minutes, 3-4 times a day.   Do not use a donut-shaped pillow or sit on the toilet for long periods. This increases blood pooling and pain.  SEEK MEDICAL CARE IF:  You have increasing pain and swelling that is not controlled by treatment or medicine.  You have uncontrolled bleeding.  You have difficulty or you are unable to have a bowel movement.  You have pain or inflammation outside the area of the hemorrhoids. MAKE SURE YOU:  Understand these instructions.  Will watch your condition.  Will get help right away if you are not doing well or get worse.   This information is not intended to replace advice given to you by your health care provider. Make sure you discuss any questions you have with your health care provider.   Document Released: 11/20/2000 Document Revised: 11/09/2012 Document Reviewed: 09/27/2012 Elsevier Interactive  Patient Education [employer].

## 2016-06-03 NOTE — Telephone Encounter (Signed)
Rx refill faxed to [facility] for One Touch Ultra test strips. #250-directions: check blood sugar 6-8 times a day. Refilled for one year.

## 2016-06-05 DIAGNOSIS — G8220 Paraplegia, unspecified: Secondary | ICD-10-CM | POA: Insufficient documentation

## 2016-06-05 DIAGNOSIS — L98499 Non-pressure chronic ulcer of skin of other sites with unspecified severity: Secondary | ICD-10-CM | POA: Insufficient documentation

## 2016-06-12 ENCOUNTER — Encounter: Payer: Self-pay | Admitting: General Surgery

## 2016-06-15 NOTE — Telephone Encounter (Signed)
The patient had tried to use other methods to provide anal stimulation instead of his finger, as finger stimulation would be an appropriate if a stapled hemorrhoidectomy was undertaken. He reported trying 3 different times and was unable to get satisfactory evacuation similar to what he had using his finger. This would make stapled hemorrhoidectomy inappropriate.  We discussed classic Ferguson hemorrhoidectomy, and he brought up the possibility of laser. I'll think that this adds anything with the primarily external component, and thermal ablation would likely not result in any improvement in the anal skin.  The patient has been asked to make use of a trial of a fleets enema per rectum to see if this provides adequate evacuation, as this might be more comfortable in the immediate post-hemorrhoidectomy timeframe.  The patient will contact the office when he would like to schedule surgery.

## 2016-06-16 ENCOUNTER — Telehealth: Payer: Self-pay

## 2016-06-16 ENCOUNTER — Encounter: Payer: Self-pay | Admitting: Internal Medicine

## 2016-06-16 NOTE — Telephone Encounter (Signed)
Patient called and wishes to schedule his surgery. Patient is scheduled for surgery at [facility] on 06/29/16. He will pre admit by phone. He will have an enema the morning od surgery at home prior to leaving. Patient is aware of date and instructions.

## 2016-06-18 ENCOUNTER — Other Ambulatory Visit: Payer: Self-pay | Admitting: General Surgery

## 2016-06-18 DIAGNOSIS — K642 Third degree hemorrhoids: Secondary | ICD-10-CM
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
## 2016-06-19 ENCOUNTER — Inpatient Hospital Stay: Admission: RE | Admit: 2016-06-19 | Payer: Managed Care, Other (non HMO) | Source: Ambulatory Visit

## 2016-06-19 ENCOUNTER — Encounter: Payer: Self-pay | Admitting: *Deleted

## 2016-06-19 NOTE — Patient Instructions (Signed)
  Your procedure is scheduled on: 06-29-16 [facility]) Report to Same Day Surgery 2nd floor medical mall To find out your arrival time please call [phone] between 1PM - 3PM on 06-26-16 (FRIDAY)  Remember: Instructions that are not followed completely may result in serious medical risk, up to and including death, or upon the discretion of your surgeon and anesthesiologist your surgery may need to be rescheduled.    _x___ 1. Do not eat food or drink liquids after midnight. No gum chewing or hard candies.     __x__ 2. No Alcohol for 24 hours before or after surgery.   __x__3. No Smoking for 24 prior to surgery.   ____  4. Bring all medications with you on the day of surgery if instructed.    __x__ 5. Notify your doctor if there is any change in your medical condition     (cold, fever, infections).     Do not wear jewelry, make-up, hairpins, clips or nail polish.  Do not wear lotions, powders, or perfumes. You may wear deodorant.  Do not shave 48 hours prior to surgery. Men may shave face and neck.  Do not bring valuables to the hospital.    [facility] is not responsible for any belongings or valuables.               Contacts, dentures or bridgework may not be worn into surgery.  Leave your suitcase in the car. After surgery it may be brought to your room.  For patients admitted to the hospital, discharge time is determined by your treatment team.   Patients discharged the day of surgery will not be allowed to drive home.    Please read over the following fact sheets that you were given:   [facility] Preparing for Surgery and or MRSA Information   _x___ Take these medicines the morning of surgery with A SIP OF WATER:    1. BACLOFEN  2.  3.  4.  5.  6.  ____ Fleet Enema (as directed)   ____ Use CHG Soap or sage wipes as directed on instruction sheet   ____ Use inhalers on the day of surgery and bring to hospital day of surgery  ____ Stop metformin 2 days prior to  surgery    ____ Take 1/2 of usual insulin dose the night before surgery and none on the morning of surgery.   ____ Stop aspirin or coumadin, or plavix  ___ Stop Anti-inflammatories such as Advil, Aleve, Ibuprofen, Motrin, Naproxen,          Naprosyn, Goodies powders or aspirin products. Ok to take Tylenol.   _X___ Stop supplements until after surgery-STOP VITAMIN C NOW    ____ Bring C-Pap to the hospital.

## 2016-06-24 ENCOUNTER — Encounter: Payer: Self-pay | Admitting: General Surgery

## 2016-06-25 NOTE — Telephone Encounter (Signed)
If the threat of surgery caused them to quiet down, no need to have surgical excision. We can always reassess if they become symptomatic in the future. If they are not bothering you, we can leave them alone. Let the office know how you would like to proceed.

## 2016-06-26 ENCOUNTER — Other Ambulatory Visit: Payer: Managed Care, Other (non HMO)

## 2016-06-26 ENCOUNTER — Encounter
Admission: RE | Admit: 2016-06-26 | Discharge: 2016-06-26 | Disposition: A | Discharge status: Home / Self Care | Payer: Managed Care, Other (non HMO) | Source: Ambulatory Visit | Attending: General Surgery | Admitting: General Surgery

## 2016-06-26 DIAGNOSIS — Z87440 Personal history of urinary (tract) infections: Secondary | ICD-10-CM | POA: Diagnosis not present

## 2016-06-26 DIAGNOSIS — Z981 Arthrodesis status: Secondary | ICD-10-CM | POA: Diagnosis not present

## 2016-06-26 DIAGNOSIS — K6289 Other specified diseases of anus and rectum: Secondary | ICD-10-CM | POA: Diagnosis not present

## 2016-06-26 DIAGNOSIS — F329 Major depressive disorder, single episode, unspecified: Secondary | ICD-10-CM | POA: Diagnosis not present

## 2016-06-26 DIAGNOSIS — N3941 Urge incontinence: Secondary | ICD-10-CM | POA: Diagnosis not present

## 2016-06-26 DIAGNOSIS — Z794 Long term (current) use of insulin: Secondary | ICD-10-CM | POA: Diagnosis not present

## 2016-06-26 DIAGNOSIS — Z5309 Procedure and treatment not carried out because of other contraindication: Secondary | ICD-10-CM | POA: Diagnosis not present

## 2016-06-26 DIAGNOSIS — Z882 Allergy status to sulfonamides status: Secondary | ICD-10-CM | POA: Diagnosis not present

## 2016-06-26 DIAGNOSIS — Z91041 Radiographic dye allergy status: Secondary | ICD-10-CM | POA: Diagnosis not present

## 2016-06-26 DIAGNOSIS — F17290 Nicotine dependence, other tobacco product, uncomplicated: Secondary | ICD-10-CM | POA: Diagnosis not present

## 2016-06-26 DIAGNOSIS — N319 Neuromuscular dysfunction of bladder, unspecified: Secondary | ICD-10-CM | POA: Diagnosis not present

## 2016-06-26 DIAGNOSIS — G8929 Other chronic pain: Secondary | ICD-10-CM | POA: Diagnosis not present

## 2016-06-26 DIAGNOSIS — Z8249 Family history of ischemic heart disease and other diseases of the circulatory system: Secondary | ICD-10-CM | POA: Diagnosis not present

## 2016-06-26 DIAGNOSIS — G8250 Quadriplegia, unspecified: Secondary | ICD-10-CM | POA: Diagnosis not present

## 2016-06-26 DIAGNOSIS — Z79899 Other long term (current) drug therapy: Secondary | ICD-10-CM | POA: Diagnosis not present

## 2016-06-26 LAB — BASIC METABOLIC PANEL
Anion gap: 6 (ref 5–15)
BUN: 9 mg/dL (ref 6–20)
CO2: 29 mmol/L (ref 22–32)
Calcium: 9.5 mg/dL (ref 8.9–10.3)
Chloride: 99 mmol/L — ABNORMAL LOW (ref 101–111)
Creatinine, Ser: 0.61 mg/dL (ref 0.61–1.24)
GFR calc Af Amer: >60 mL/min (ref >60)
GFR calc non Af Amer: >60 mL/min (ref >60)
Glucose, Bld: 99 mg/dL (ref 65–99)
Potassium: 4.3 mmol/L (ref 3.5–5.1)
Sodium: 134 mmol/L — ABNORMAL LOW (ref 135–145)
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
## 2016-06-29 ENCOUNTER — Telehealth: Payer: Self-pay

## 2016-06-29 ENCOUNTER — Encounter
Admission: RE | Disposition: A | Discharge status: Home / Self Care | Payer: Self-pay | Source: Ambulatory Visit | Attending: General Surgery

## 2016-06-29 ENCOUNTER — Ambulatory Visit
Admission: RE | Admit: 2016-06-29 | Discharge: 2016-06-29 | Disposition: A | Discharge status: Home / Self Care | Payer: Managed Care, Other (non HMO) | Source: Ambulatory Visit | Attending: General Surgery | Admitting: General Surgery

## 2016-06-29 ENCOUNTER — Encounter: Payer: Self-pay | Admitting: *Deleted

## 2016-06-29 ENCOUNTER — Encounter: Payer: Self-pay | Admitting: Registered Nurse

## 2016-06-29 ENCOUNTER — Telehealth: Payer: Self-pay | Admitting: *Deleted

## 2016-06-29 DIAGNOSIS — Z87440 Personal history of urinary (tract) infections: Secondary | ICD-10-CM | POA: Insufficient documentation

## 2016-06-29 DIAGNOSIS — F329 Major depressive disorder, single episode, unspecified: Secondary | ICD-10-CM | POA: Insufficient documentation

## 2016-06-29 DIAGNOSIS — Z79899 Other long term (current) drug therapy: Secondary | ICD-10-CM | POA: Insufficient documentation

## 2016-06-29 DIAGNOSIS — G8250 Quadriplegia, unspecified: Secondary | ICD-10-CM | POA: Insufficient documentation

## 2016-06-29 DIAGNOSIS — K6289 Other specified diseases of anus and rectum: Secondary | ICD-10-CM | POA: Diagnosis not present

## 2016-06-29 DIAGNOSIS — K642 Third degree hemorrhoids: Secondary | ICD-10-CM

## 2016-06-29 DIAGNOSIS — Z882 Allergy status to sulfonamides status: Secondary | ICD-10-CM | POA: Insufficient documentation

## 2016-06-29 DIAGNOSIS — G8929 Other chronic pain: Secondary | ICD-10-CM | POA: Insufficient documentation

## 2016-06-29 DIAGNOSIS — N319 Neuromuscular dysfunction of bladder, unspecified: Secondary | ICD-10-CM | POA: Insufficient documentation

## 2016-06-29 DIAGNOSIS — Z91041 Radiographic dye allergy status: Secondary | ICD-10-CM | POA: Insufficient documentation

## 2016-06-29 DIAGNOSIS — Z981 Arthrodesis status: Secondary | ICD-10-CM | POA: Insufficient documentation

## 2016-06-29 DIAGNOSIS — Z5309 Procedure and treatment not carried out because of other contraindication: Secondary | ICD-10-CM | POA: Insufficient documentation

## 2016-06-29 DIAGNOSIS — Z794 Long term (current) use of insulin: Secondary | ICD-10-CM | POA: Insufficient documentation

## 2016-06-29 DIAGNOSIS — Z8249 Family history of ischemic heart disease and other diseases of the circulatory system: Secondary | ICD-10-CM | POA: Insufficient documentation

## 2016-06-29 DIAGNOSIS — F17290 Nicotine dependence, other tobacco product, uncomplicated: Secondary | ICD-10-CM | POA: Insufficient documentation

## 2016-06-29 DIAGNOSIS — N3941 Urge incontinence: Secondary | ICD-10-CM | POA: Insufficient documentation

## 2016-06-29 HISTORY — DX: Hypotension, unspecified: I95.9

## 2016-06-29 LAB — GLUCOSE, CAPILLARY: Glucose-Capillary: 242 mg/dL — ABNORMAL HIGH (ref 65–99)

## 2016-06-29 SURGERY — HEMORRHOIDECTOMY
Anesthesia: Choice

## 2016-06-29 MED ORDER — SODIUM CHLORIDE 0.9 % IV SOLN
1.0000 g | INTRAVENOUS | Status: AC
  Filled 2016-06-29: qty 1

## 2016-06-29 MED ORDER — FAMOTIDINE 20 MG PO TABS
20.0000 mg | ORAL_TABLET | Freq: Once | ORAL | Status: AC
  Administered 2016-06-29: 20 mg via ORAL

## 2016-06-29 MED ORDER — SODIUM CHLORIDE 0.9 % IV SOLN
INTRAVENOUS | Status: DC
  Administered 2016-06-29: 07:00:00 via INTRAVENOUS

## 2016-06-29 SURGICAL SUPPLY — 27 items
BRIEF STRETCH MATERNITY 2XLG (MISCELLANEOUS) ×1 IMPLANT
CANISTER SUCT 1200ML W/VALVE (MISCELLANEOUS) ×1 IMPLANT
DRAPE LAPAROTOMY 100X77 ABD (DRAPES) ×1 IMPLANT
DRAPE LEGGINS SURG 28X43 STRL (DRAPES) ×1 IMPLANT
DRAPE UNDER BUTTOCK W/FLU (DRAPES) ×1 IMPLANT
DRSG GAUZE PETRO 6X36 STRIP ST (GAUZE/BANDAGES/DRESSINGS) ×1 IMPLANT
ELECT REM PT RETURN 9FT ADLT (ELECTROSURGICAL)
ELECTRODE REM PT RTRN 9FT ADLT (ELECTROSURGICAL) ×1 IMPLANT
GLOVE BIO SURGEON STRL SZ7.5 (GLOVE) ×1 IMPLANT
GLOVE INDICATOR 8.0 STRL GRN (GLOVE) ×1 IMPLANT
GOWN STRL REUS W/ TWL LRG LVL3 (GOWN DISPOSABLE) ×2 IMPLANT
GOWN STRL REUS W/TWL LRG LVL3 (GOWN DISPOSABLE)
HARMONIC SCALPEL FOCUS (MISCELLANEOUS) IMPLANT
KIT RM TURNOVER STRD PROC AR (KITS) ×1 IMPLANT
LABEL OR SOLS (LABEL) ×1 IMPLANT
NDL HYPO 25X1 1.5 SAFETY (NEEDLE) ×1 IMPLANT
NDL SAFETY 22GX1.5 (NEEDLE) ×1 IMPLANT
NEEDLE HYPO 25X1 1.5 SAFETY (NEEDLE) IMPLANT
PACK BASIN MINOR ARMC (MISCELLANEOUS) ×1 IMPLANT
PAD OB MATERNITY 4.3X12.25 (PERSONAL CARE ITEMS) ×1 IMPLANT
PAD PREP 24X41 OB/GYN DISP (PERSONAL CARE ITEMS) ×1 IMPLANT
SURGILUBE 2OZ TUBE FLIPTOP (MISCELLANEOUS) ×1 IMPLANT
SUT CHROMIC 3 0 SH 27 (SUTURE) ×1 IMPLANT
SUT SILK 0 CT 1 30 (SUTURE) ×1 IMPLANT
SUT VIC AB 3-0 SH 27 (SUTURE)
SUT VIC AB 3-0 SH 27X BRD (SUTURE) IMPLANT
SYR CONTROL 10ML (SYRINGE) ×2 IMPLANT

## 2016-06-29 NOTE — H&P (Signed)
Case cancelled secondary to OR issues. Will be rescheduled.

## 2016-06-29 NOTE — Telephone Encounter (Signed)
Patient called and wants to reschedule his surgery for today. He is now scheduled for surgery at [facility] on 07/03/16. He is aware of date and instructions.

## 2016-06-29 NOTE — Anesthesia Preprocedure Evaluation (Deleted)
Anesthesia Evaluation  Patient identified by MRN, date of birth, ID band Patient awake and Patient unresponsive    Reviewed: Allergy & Precautions, H&P , NPO status , Patient's Chart, lab work & pertinent test results, reviewed documented beta blocker date and time   History of Anesthesia Complications Negative for: history of anesthetic complications  Airway Mallampati: I  TM Distance: >3 FB Neck ROM: full    Dental no notable dental hx. (+) Chipped   Pulmonary neg pulmonary ROS,    Pulmonary exam normal breath sounds clear to auscultation       Cardiovascular Exercise Tolerance: Good negative cardio ROS Normal cardiovascular exam Rhythm:regular Rate:Normal     Neuro/Psych PSYCHIATRIC DISORDERS (Depression) negative neurological ROS     GI/Hepatic negative GI ROS, Neg liver ROS,   Endo/Other  diabetes, Insulin Dependent  Renal/GU Renal disease (Kidney stones)  negative genitourinary   Musculoskeletal   Abdominal   Peds  Hematology negative hematology ROS (+)   Anesthesia Other Findings Past Medical History: No date: Chronic pain     Comment: secondary 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 No date: Depression No date: Fainting episodes No date: History of frequent urinary tract infections     Comment: neurogenic bladder No date: Low blood pressure No date: Nephrolithiasis     Comment: STONES No date: Quadriplegia, C5-C7, incomplete (HCC)     Comment: C7 s/p cervical fusion (secondary to [place]) No date: Type 1 diabetes mellitus (HCC)     Comment: type 1 No date: Urinary tract bacterial infections No date: Urine incontinence   Reproductive/Obstetrics negative OB ROS                             Anesthesia Physical Anesthesia Plan  ASA: II  Anesthesia Plan: General   Post-op Pain Management:    Induction:   Airway Management Planned:   Additional Equipment:    Intra-op Plan:   Post-operative Plan:   Informed Consent: I have reviewed the patients History and Physical, chart, labs and discussed the procedure including the risks, benefits and alternatives for the proposed anesthesia with the patient or authorized representative who has indicated his/her understanding and acceptance.   Dental Advisory Given  Plan Discussed with: Anesthesiologist, CRNA and Surgeon  Anesthesia Plan Comments:         Anesthesia Quick Evaluation

## 2016-06-29 NOTE — OR Nursing (Signed)
Pt decision to leave  Now and  reschedule surgery due to [facility] in OR and case to be delayed several hours.  IVd/c. Pt to call office to reschedule
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
## 2016-07-02 ENCOUNTER — Telehealth: Payer: Self-pay

## 2016-07-02 NOTE — Telephone Encounter (Signed)
Patient called to cancel his surgery scheduled for 07/03/16. He states that he has developed a UTI and is currently on Macrobid. He will find out in a few days if this antibiotic is effective for treating the UTI. He will call back next week to reschedule his surgery once his infection is resolved. Dr Bary Castilla is aware of this.

## 2016-07-03 ENCOUNTER — Encounter: Admission: RE | Payer: Self-pay | Source: Ambulatory Visit

## 2016-07-03 ENCOUNTER — Ambulatory Visit
Admission: RE | Admit: 2016-07-03 | Payer: Managed Care, Other (non HMO) | Source: Ambulatory Visit | Admitting: General Surgery

## 2016-07-03 ENCOUNTER — Ambulatory Visit: Payer: Managed Care, Other (non HMO) | Admitting: Internal Medicine

## 2016-07-03 SURGERY — HEMORRHOIDECTOMY
Anesthesia: Choice

## 2016-07-13 ENCOUNTER — Telehealth: Payer: Self-pay

## 2016-07-13 NOTE — Telephone Encounter (Signed)
The patient called to reschedule his hemorrhoidectomy surgery. He is now scheduled for surgery at [facility] on 07/28/16. He will pre admit by phone. He will have a pre op visit here with Dr Bary Castilla on 07/23/16 at 4:30 pm. The patient is aware of dates, time, and instructions.

## 2016-07-21 ENCOUNTER — Other Ambulatory Visit: Payer: Self-pay | Admitting: Urology

## 2016-07-21 ENCOUNTER — Inpatient Hospital Stay: Admission: RE | Admit: 2016-07-21 | Payer: Managed Care, Other (non HMO) | Source: Ambulatory Visit

## 2016-07-21 ENCOUNTER — Telehealth: Payer: Self-pay

## 2016-07-21 DIAGNOSIS — N390 Urinary tract infection, site not specified: Secondary | ICD-10-CM

## 2016-07-21 DIAGNOSIS — N200 Calculus of kidney: Secondary | ICD-10-CM

## 2016-07-21 NOTE — Telephone Encounter (Signed)
Patient called to cancel his surgery scheduled for 07/28/16. He states that he keeps getting UTIs and may have a bladder or kidney stone that is giving him trouble. He states that he will call back to reschedule his surgery once this problem has been resolved. He is aware that he will need to be seen here in office prior to his surgery.

## 2016-07-23 ENCOUNTER — Ambulatory Visit: Payer: Managed Care, Other (non HMO) | Admitting: General Surgery

## 2016-07-28 ENCOUNTER — Ambulatory Visit
Admission: RE | Admit: 2016-07-28 | Payer: Managed Care, Other (non HMO) | Source: Ambulatory Visit | Admitting: General Surgery

## 2016-07-28 ENCOUNTER — Encounter: Admission: RE | Payer: Self-pay | Source: Ambulatory Visit

## 2016-07-28 SURGERY — HEMORRHOIDECTOMY
Anesthesia: Choice

## 2016-07-30 ENCOUNTER — Encounter: Payer: Self-pay | Admitting: General Surgery

## 2016-08-03 ENCOUNTER — Encounter: Payer: Self-pay | Admitting: Internal Medicine

## 2016-08-03 DIAGNOSIS — K649 Unspecified hemorrhoids: Secondary | ICD-10-CM

## 2016-08-04 NOTE — Telephone Encounter (Signed)
Order placed for referral to surgery per my chart request.

## 2016-08-05 ENCOUNTER — Ambulatory Visit
Admission: RE | Admit: 2016-08-05 | Discharge: 2016-08-05 | Disposition: A | Discharge status: Home / Self Care | Payer: Managed Care, Other (non HMO) | Source: Ambulatory Visit | Attending: Urology | Admitting: Urology

## 2016-08-05 DIAGNOSIS — K623 Rectal prolapse: Secondary | ICD-10-CM | POA: Diagnosis not present

## 2016-08-05 DIAGNOSIS — N200 Calculus of kidney: Secondary | ICD-10-CM | POA: Diagnosis not present

## 2016-08-05 DIAGNOSIS — Z87440 Personal history of urinary (tract) infections: Secondary | ICD-10-CM | POA: Diagnosis not present

## 2016-08-05 DIAGNOSIS — R109 Unspecified abdominal pain: Secondary | ICD-10-CM | POA: Diagnosis present

## 2016-08-05 DIAGNOSIS — N390 Urinary tract infection, site not specified: Secondary | ICD-10-CM

## 2016-08-05 NOTE — Telephone Encounter (Signed)
See attached for a new fax number regarding the referral.  Let me know if any questions.  Thank you.   Dr Nicki Reaper

## 2016-08-07 ENCOUNTER — Other Ambulatory Visit: Payer: Self-pay | Admitting: Internal Medicine

## 2016-08-07 ENCOUNTER — Other Ambulatory Visit (INDEPENDENT_AMBULATORY_CARE_PROVIDER_SITE_OTHER): Payer: Managed Care, Other (non HMO)

## 2016-08-07 DIAGNOSIS — E109 Type 1 diabetes mellitus without complications: Secondary | ICD-10-CM

## 2016-08-07 LAB — LIPID PANEL
CHOL/HDL RATIO: 2
CHOLESTEROL: 156 mg/dL (ref 0–200)
HDL: 67.6 mg/dL (ref >39.00)
LDL CALC: 79 mg/dL (ref 0–99)
NonHDL: 88.35
Triglycerides: 46 mg/dL (ref 0.0–149.0)
VLDL: 9.2 mg/dL (ref 0.0–40.0)

## 2016-08-07 LAB — HEPATIC FUNCTION PANEL
ALT: 15 U/L (ref 0–53)
AST: 15 U/L (ref 0–37)
Albumin: 4 g/dL (ref 3.5–5.2)
Alkaline Phosphatase: 82 U/L (ref 39–117)
BILIRUBIN TOTAL: 0.5 mg/dL (ref 0.2–1.2)
Bilirubin, Direct: 0.1 mg/dL (ref 0.0–0.3)
TOTAL PROTEIN: 6.7 g/dL (ref 6.0–8.3)

## 2016-08-07 LAB — BASIC METABOLIC PANEL
BUN: 10 mg/dL (ref 6–23)
CALCIUM: 8.8 mg/dL (ref 8.4–10.5)
CO2: 31 meq/L (ref 19–32)
Chloride: 97 mEq/L (ref 96–112)
Creatinine, Ser: 0.76 mg/dL (ref 0.40–1.50)
GFR: 117.15 mL/min (ref >60.00)
Glucose, Bld: 202 mg/dL — ABNORMAL HIGH (ref 70–99)
POTASSIUM: 4 meq/L (ref 3.5–5.1)
SODIUM: 131 meq/L — AB (ref 135–145)

## 2016-08-07 LAB — HEMOGLOBIN A1C: HEMOGLOBIN A1C: 7.1 % — AB (ref 4.6–6.5)

## 2016-08-08 ENCOUNTER — Encounter: Payer: Self-pay | Admitting: Internal Medicine

## 2016-08-14 ENCOUNTER — Ambulatory Visit (INDEPENDENT_AMBULATORY_CARE_PROVIDER_SITE_OTHER): Payer: Managed Care, Other (non HMO) | Admitting: Internal Medicine

## 2016-08-14 ENCOUNTER — Telehealth: Payer: Self-pay | Admitting: *Deleted

## 2016-08-14 ENCOUNTER — Encounter: Payer: Self-pay | Admitting: Internal Medicine

## 2016-08-14 DIAGNOSIS — G8220 Paraplegia, unspecified: Secondary | ICD-10-CM

## 2016-08-14 DIAGNOSIS — L98491 Non-pressure chronic ulcer of skin of other sites limited to breakdown of skin: Secondary | ICD-10-CM | POA: Diagnosis not present

## 2016-08-14 DIAGNOSIS — E109 Type 1 diabetes mellitus without complications: Secondary | ICD-10-CM | POA: Diagnosis not present

## 2016-08-14 DIAGNOSIS — D649 Anemia, unspecified: Secondary | ICD-10-CM

## 2016-08-14 DIAGNOSIS — Z87440 Personal history of urinary (tract) infections: Secondary | ICD-10-CM | POA: Diagnosis not present

## 2016-08-14 DIAGNOSIS — K649 Unspecified hemorrhoids: Secondary | ICD-10-CM

## 2016-08-14 DIAGNOSIS — F1010 Alcohol abuse, uncomplicated: Secondary | ICD-10-CM
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
## 2016-08-14 NOTE — Progress Notes (Signed)
Pre visit review using our clinic review tool, if applicable. No additional management support is needed unless otherwise documented below in the visit note. 

## 2016-08-14 NOTE — Telephone Encounter (Signed)
Lm for pt to call back and sched appt and lab

## 2016-08-14 NOTE — Telephone Encounter (Signed)
Pt will need a three month follow up in three months along with fasting labs  Pt requested fridays after 12

## 2016-08-16 ENCOUNTER — Encounter: Payer: Self-pay | Admitting: Internal Medicine

## 2016-08-16 NOTE — Assessment & Plan Note (Signed)
Very functional.  Follow.

## 2016-08-16 NOTE — Assessment & Plan Note (Signed)
Sugars varying.  Should improve with treatment of infection, etc.  He regulates his insulin.  Last a1c improved - a1c 7.1.  Discussed need for eye exams.

## 2016-08-16 NOTE — Assessment & Plan Note (Signed)
Planning to see surgery.

## 2016-08-16 NOTE — Assessment & Plan Note (Signed)
Discussed the need to decrease alcohol intake.  Follow.

## 2016-08-16 NOTE — Assessment & Plan Note (Signed)
Has skin breakdown.  Saw wound clinic and surgery.  Planning for hemorrhoid surgery.  Follow.

## 2016-08-16 NOTE — Assessment & Plan Note (Signed)
Has had GI w/up.  Follow cbc.

## 2016-08-16 NOTE — Assessment & Plan Note (Signed)
Has catheter.  Seeing urology.  On abx.  Follow.

## 2016-09-01 ENCOUNTER — Encounter: Payer: Self-pay | Admitting: General Surgery

## 2016-09-01 ENCOUNTER — Ambulatory Visit (INDEPENDENT_AMBULATORY_CARE_PROVIDER_SITE_OTHER): Payer: Managed Care, Other (non HMO) | Admitting: General Surgery

## 2016-09-01 VITALS — BP 188/102 | HR 76 | Resp 16 | Ht 72.0 in | Wt 165.0 lb

## 2016-09-01 DIAGNOSIS — K648 Other hemorrhoids: Secondary | ICD-10-CM

## 2016-09-01 DIAGNOSIS — K644 Residual hemorrhoidal skin tags: Secondary | ICD-10-CM
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
## 2016-09-01 NOTE — Patient Instructions (Addendum)
The patient is aware to call back for any questions or concerns. Schedule surgery

## 2016-09-02 ENCOUNTER — Other Ambulatory Visit: Payer: Self-pay | Admitting: Internal Medicine

## 2016-09-03 ENCOUNTER — Telehealth: Payer: Self-pay | Admitting: *Deleted

## 2016-09-03 NOTE — Telephone Encounter (Signed)
Patient called and just wanted to let you know that he does have a UTI and he is still planning on getting the surgery done but he has to wait until the UTI is cleared up.

## 2016-09-28 ENCOUNTER — Encounter: Payer: Self-pay | Admitting: Internal Medicine

## 2016-09-28 ENCOUNTER — Encounter: Payer: Self-pay | Admitting: *Deleted

## 2016-09-28 ENCOUNTER — Other Ambulatory Visit: Payer: Self-pay | Admitting: Internal Medicine

## 2016-09-28 MED ORDER — BACLOFEN 20 MG PO TABS: ORAL_TABLET | ORAL | 1 refills | Status: DC

## 2016-09-28 MED ORDER — INSULIN DETEMIR 100 UNIT/ML ~~LOC~~ SOLN: SUBCUTANEOUS | 1 refills | Status: DC

## 2016-09-28 NOTE — Telephone Encounter (Signed)
Ok to fill 

## 2016-09-28 NOTE — Progress Notes (Signed)
Patient's hemorrhoidectomy has been scheduled for 10-06-16 at [facility].

## 2016-09-29 ENCOUNTER — Other Ambulatory Visit: Payer: Self-pay | Admitting: General Surgery

## 2016-09-29 DIAGNOSIS — K642 Third degree hemorrhoids: Secondary | ICD-10-CM

## 2016-10-01 ENCOUNTER — Telehealth: Payer: Self-pay

## 2016-10-01 NOTE — Telephone Encounter (Signed)
Patient called to cancel his surgery for now. He states that he had lost his job and was supposed to have [employer] but that it has not gone thru yet. He will call back to reschedule once he has insurance established. He is aware he will need to be seen in the office prior to his surgery.

## 2016-10-02 ENCOUNTER — Inpatient Hospital Stay: Admission: RE | Admit: 2016-10-02 | Payer: Managed Care, Other (non HMO) | Source: Ambulatory Visit

## 2016-10-05 ENCOUNTER — Ambulatory Visit: Payer: Managed Care, Other (non HMO) | Admitting: General Surgery

## 2016-10-06 ENCOUNTER — Ambulatory Visit
Admission: RE | Admit: 2016-10-06 | Payer: Managed Care, Other (non HMO) | Source: Ambulatory Visit | Admitting: General Surgery

## 2016-10-06 ENCOUNTER — Encounter: Admission: RE | Payer: Self-pay | Source: Ambulatory Visit

## 2016-10-06 SURGERY — HEMORRHOIDECTOMY
Anesthesia: Choice

## 2016-10-22 ENCOUNTER — Encounter: Payer: Self-pay | Admitting: *Deleted

## 2016-10-22 NOTE — Progress Notes (Signed)
Patient called the office today to reschedule hemorrhoidectomy for 11-03-16 at [facility].   A pre-op visit was also scheduled.

## 2016-10-26 ENCOUNTER — Other Ambulatory Visit: Payer: Self-pay | Admitting: General Surgery

## 2016-10-26 DIAGNOSIS — K642 Third degree hemorrhoids: Secondary | ICD-10-CM

## 2016-10-28 MED ORDER — BUPIVACAINE HCL (PF) 0.25 % IJ SOLN
INTRAMUSCULAR | Status: AC
  Filled 2016-10-28: qty 60

## 2016-10-28 MED ORDER — SODIUM CHLORIDE 0.9 % IJ SOLN
INTRAMUSCULAR | Status: AC
  Filled 2016-10-28: qty 50

## 2016-10-28 MED ORDER — NEOMYCIN-POLYMYXIN B GU 40-200000 IR SOLN
Status: AC
  Filled 2016-10-28: qty 20

## 2016-10-28 MED ORDER — BUPIVACAINE LIPOSOME 1.3 % IJ SUSP
INTRAMUSCULAR | Status: AC
  Filled 2016-10-28: qty 20

## 2016-11-02 ENCOUNTER — Ambulatory Visit (INDEPENDENT_AMBULATORY_CARE_PROVIDER_SITE_OTHER): Payer: Managed Care, Other (non HMO) | Admitting: General Surgery

## 2016-11-02 ENCOUNTER — Encounter
Admission: RE | Admit: 2016-11-02 | Discharge: 2016-11-02 | Disposition: A | Discharge status: Home / Self Care | Payer: Managed Care, Other (non HMO) | Source: Ambulatory Visit | Attending: General Surgery | Admitting: General Surgery

## 2016-11-02 ENCOUNTER — Other Ambulatory Visit: Payer: Managed Care, Other (non HMO)

## 2016-11-02 ENCOUNTER — Encounter: Payer: Self-pay | Admitting: General Surgery

## 2016-11-02 VITALS — BP 148/79 | HR 52 | Resp 14 | Ht 72.0 in | Wt 175.0 lb

## 2016-11-02 DIAGNOSIS — K644 Residual hemorrhoidal skin tags: Secondary | ICD-10-CM | POA: Diagnosis not present

## 2016-11-02 HISTORY — DX: Personal history of urinary calculi: Z87.442

## 2016-11-02 MED ORDER — SODIUM CHLORIDE 0.9 % IV SOLN
1.0000 g | INTRAVENOUS | Status: AC
  Administered 2016-11-03: 1 g via INTRAVENOUS
  Filled 2016-11-02: qty 1

## 2016-11-02 MED ORDER — OXYCODONE-ACETAMINOPHEN 5-325 MG PO TABS: 1.0000 | ORAL_TABLET | ORAL | 0 refills | Status: DC | PRN

## 2016-11-02 NOTE — Patient Instructions (Addendum)
  Your procedure is scheduled on: 11-03-16 Report to Same Day Surgery 2nd floor medical mall [facility] Entrance-take elevator on left to 2nd floor.  Check in with surgery information desk.) @ 6 am per pt   Remember: Instructions that are not followed completely may result in serious medical risk, up to and including death, or upon the discretion of your surgeon and anesthesiologist your surgery may need to be rescheduled.    _x___ 1. Do not eat food or drink liquids after midnight. No gum chewing or hard candies.     __x__ 2. No Alcohol for 24 hours before or after surgery.   __x__3. No Smoking for 24 prior to surgery.   ____  4. Bring all medications with you on the day of surgery if instructed.    __x__ 5. Notify your doctor if there is any change in your medical condition     (cold, fever, infections).     Do not wear jewelry, make-up, hairpins, clips or nail polish.  Do not wear lotions, powders, or perfumes. You may wear deodorant.  Do not shave 48 hours prior to surgery. Men may shave face and neck.  Do not bring valuables to the hospital.    [facility] is not responsible for any belongings or valuables.               Contacts, dentures or bridgework may not be worn into surgery.  Leave your suitcase in the car. After surgery it may be brought to your room.  For patients admitted to the hospital, discharge time is determined by your treatment team.   Patients discharged the day of surgery will not be allowed to drive home.  You will need someone to drive you home and stay with you the night of your procedure.    Please read over the following fact sheets that you were given:   [facility] Preparing for Surgery and or MRSA Information   _x___ Take these medicines the morning of surgery with A SIP OF WATER:    1. baclofen  2.  3.  4.  5.  6.  _x___Fleets enema or Magnesium Citrate as directed- FLEET ENEMA 1 HOUR PRIOR TO [place]   ____ Use CHG Soap  or sage wipes as directed on instruction sheet   ____ Use inhalers on the day of surgery and bring to hospital day of surgery  ____ Stop metformin 2 days prior to surgery    _X___ Take 1/2 of usual insulin dose the night before surgery and none on the morning of surgery-TAKE 8 UNITS OF LEVEMIR TONIGHT AND NO INSULIN IN AM  ____ Stop Aspirin, Coumadin, Plavix ,Eliquis, Effient, or Pradaxa  __ Stop Anti-inflammatories such as Advil, Aleve, Ibuprofen, Motrin, Naproxen,          Naprosyn, Goodies powders or aspirin products. Ok to take Tylenol.   ____ Stop supplements until after surgery-LAST DOSE OF VIT C TODAY (11-02-16)     ____ Bring C-Pap to the hospital.
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
## 2016-11-03 ENCOUNTER — Encounter: Payer: Self-pay | Admitting: Anesthesiology

## 2016-11-03 ENCOUNTER — Encounter
Admission: RE | Disposition: A | Discharge status: Home / Self Care | Payer: Self-pay | Source: Ambulatory Visit | Attending: General Surgery

## 2016-11-03 ENCOUNTER — Ambulatory Visit
Admission: RE | Admit: 2016-11-03 | Discharge: 2016-11-03 | Disposition: A | Discharge status: Home / Self Care | Payer: Managed Care, Other (non HMO) | Source: Ambulatory Visit | Attending: General Surgery | Admitting: General Surgery

## 2016-11-03 ENCOUNTER — Other Ambulatory Visit: Payer: Self-pay

## 2016-11-03 ENCOUNTER — Ambulatory Visit: Payer: Managed Care, Other (non HMO) | Admitting: Anesthesiology

## 2016-11-03 DIAGNOSIS — Z981 Arthrodesis status: Secondary | ICD-10-CM | POA: Diagnosis not present

## 2016-11-03 DIAGNOSIS — Z87440 Personal history of urinary (tract) infections: Secondary | ICD-10-CM | POA: Diagnosis not present

## 2016-11-03 DIAGNOSIS — Z91018 Allergy to other foods: Secondary | ICD-10-CM | POA: Insufficient documentation

## 2016-11-03 DIAGNOSIS — D649 Anemia, unspecified: Secondary | ICD-10-CM | POA: Diagnosis not present

## 2016-11-03 DIAGNOSIS — K642 Third degree hemorrhoids: Secondary | ICD-10-CM

## 2016-11-03 DIAGNOSIS — K620 Anal polyp: Secondary | ICD-10-CM | POA: Diagnosis not present

## 2016-11-03 DIAGNOSIS — Z888 Allergy status to other drugs, medicaments and biological substances status: Secondary | ICD-10-CM | POA: Insufficient documentation

## 2016-11-03 DIAGNOSIS — F329 Major depressive disorder, single episode, unspecified: Secondary | ICD-10-CM | POA: Diagnosis not present

## 2016-11-03 DIAGNOSIS — K626 Ulcer of anus and rectum: Secondary | ICD-10-CM | POA: Diagnosis not present

## 2016-11-03 DIAGNOSIS — K644 Residual hemorrhoidal skin tags: Secondary | ICD-10-CM | POA: Insufficient documentation

## 2016-11-03 DIAGNOSIS — K648 Other hemorrhoids: Secondary | ICD-10-CM | POA: Diagnosis not present

## 2016-11-03 DIAGNOSIS — E109 Type 1 diabetes mellitus without complications: Secondary | ICD-10-CM | POA: Diagnosis not present

## 2016-11-03 DIAGNOSIS — G8220 Paraplegia, unspecified: Secondary | ICD-10-CM | POA: Diagnosis not present

## 2016-11-03 DIAGNOSIS — Z794 Long term (current) use of insulin: Secondary | ICD-10-CM | POA: Insufficient documentation

## 2016-11-03 DIAGNOSIS — L98491 Non-pressure chronic ulcer of skin of other sites limited to breakdown of skin: Secondary | ICD-10-CM | POA: Diagnosis not present

## 2016-11-03 DIAGNOSIS — Z91041 Radiographic dye allergy status: Secondary | ICD-10-CM | POA: Diagnosis not present

## 2016-11-03 HISTORY — PX: HEMORRHOID SURGERY: SHX153

## 2016-11-03 LAB — GLUCOSE, CAPILLARY: Glucose-Capillary: 238 mg/dL — ABNORMAL HIGH (ref 65–99)

## 2016-11-03 LAB — BASIC METABOLIC PANEL
Anion gap: 8 (ref 5–15)
BUN: 8 mg/dL (ref 6–20)
CALCIUM: 9.1 mg/dL (ref 8.9–10.3)
CHLORIDE: 97 mmol/L — AB (ref 101–111)
CO2: 26 mmol/L (ref 22–32)
CREATININE: 0.64 mg/dL (ref 0.61–1.24)
GFR calc non Af Amer: >60 mL/min (ref >60)
Glucose, Bld: 241 mg/dL — ABNORMAL HIGH (ref 65–99)
Potassium: 3.9 mmol/L (ref 3.5–5.1)
SODIUM: 131 mmol/L — AB (ref 135–145)

## 2016-11-03 SURGERY — HEMORRHOIDECTOMY
Anesthesia: General | Wound class: Clean Contaminated

## 2016-11-03 MED ORDER — FENTANYL CITRATE (PF) 100 MCG/2ML IJ SOLN
INTRAMUSCULAR | Status: DC | PRN
  Administered 2016-11-03 (×2): 25 ug via INTRAVENOUS

## 2016-11-03 MED ORDER — FAMOTIDINE 20 MG PO TABS
ORAL_TABLET | ORAL | Status: AC
  Administered 2016-11-03: 20 mg via ORAL
  Filled 2016-11-03: qty 1

## 2016-11-03 MED ORDER — FAMOTIDINE 20 MG PO TABS
20.0000 mg | ORAL_TABLET | Freq: Once | ORAL | Status: AC
  Administered 2016-11-03: 20 mg via ORAL

## 2016-11-03 MED ORDER — BUPIVACAINE LIPOSOME 1.3 % IJ SUSP
INTRAMUSCULAR | Status: AC
  Filled 2016-11-03: qty 20

## 2016-11-03 MED ORDER — BUPIVACAINE HCL (PF) 0.25 % IJ SOLN
INTRAMUSCULAR | Status: DC | PRN
  Administered 2016-11-03: 30 mL

## 2016-11-03 MED ORDER — FENTANYL CITRATE (PF) 100 MCG/2ML IJ SOLN
INTRAMUSCULAR | Status: AC
  Filled 2016-11-03: qty 2

## 2016-11-03 MED ORDER — MIDAZOLAM HCL 2 MG/2ML IJ SOLN
INTRAMUSCULAR | Status: DC | PRN
  Administered 2016-11-03: 2 mg via INTRAVENOUS

## 2016-11-03 MED ORDER — LABETALOL HCL 5 MG/ML IV SOLN
5.0000 mg | Freq: Once | INTRAVENOUS | Status: AC
  Administered 2016-11-03: 5 mg via INTRAVENOUS
  Filled 2016-11-03: qty 4

## 2016-11-03 MED ORDER — SODIUM CHLORIDE 0.9 % IV SOLN
INTRAVENOUS | Status: DC
  Administered 2016-11-03: 07:00:00 via INTRAVENOUS

## 2016-11-03 MED ORDER — FENTANYL CITRATE (PF) 100 MCG/2ML IJ SOLN
25.0000 ug | INTRAMUSCULAR | Status: DC | PRN
  Administered 2016-11-03: 25 ug via INTRAVENOUS

## 2016-11-03 MED ORDER — ONDANSETRON HCL 4 MG/2ML IJ SOLN
INTRAMUSCULAR | Status: DC | PRN
  Administered 2016-11-03: 4 mg via INTRAVENOUS

## 2016-11-03 MED ORDER — LABETALOL HCL 5 MG/ML IV SOLN
INTRAVENOUS | Status: AC
  Filled 2016-11-03: qty 4

## 2016-11-03 MED ORDER — ONDANSETRON HCL 4 MG/2ML IJ SOLN: 4.0000 mg | Freq: Once | INTRAMUSCULAR | Status: DC | PRN

## 2016-11-03 MED ORDER — BUPIVACAINE LIPOSOME 1.3 % IJ SUSP
INTRAMUSCULAR | Status: DC | PRN
  Administered 2016-11-03: 20 mL

## 2016-11-03 MED ORDER — LIDOCAINE HCL (CARDIAC) 20 MG/ML IV SOLN
INTRAVENOUS | Status: DC | PRN
  Administered 2016-11-03: 80 mg via INTRAVENOUS

## 2016-11-03 MED ORDER — GLYCOPYRROLATE 0.2 MG/ML IJ SOLN
INTRAMUSCULAR | Status: DC | PRN
  Administered 2016-11-03: 0.2 mg via INTRAVENOUS

## 2016-11-03 MED ORDER — PROPOFOL 10 MG/ML IV BOLUS
INTRAVENOUS | Status: DC | PRN
  Administered 2016-11-03: 180 mg via INTRAVENOUS

## 2016-11-03 MED ORDER — BUPIVACAINE HCL (PF) 0.25 % IJ SOLN
INTRAMUSCULAR | Status: AC
  Filled 2016-11-03: qty 30

## 2016-11-03 SURGICAL SUPPLY — 35 items
BLADE SURG 15 STRL SS SAFETY (BLADE) ×1 IMPLANT
BRIEF STRETCH MATERNITY 2XLG (MISCELLANEOUS) ×2 IMPLANT
CANISTER SUCT 1200ML W/VALVE (MISCELLANEOUS) ×2 IMPLANT
DRAPE LAPAROTOMY 100X77 ABD (DRAPES) ×2 IMPLANT
DRAPE LEGGINS SURG 28X43 STRL (DRAPES) ×2 IMPLANT
DRAPE UNDER BUTTOCK W/FLU (DRAPES) ×2 IMPLANT
DRSG GAUZE PETRO 6X36 STRIP ST (GAUZE/BANDAGES/DRESSINGS) ×2 IMPLANT
DRSG TELFA 3X8 NADH (GAUZE/BANDAGES/DRESSINGS) ×2 IMPLANT
ELECT REM PT RETURN 9FT ADLT (ELECTROSURGICAL) ×2
ELECTRODE REM PT RTRN 9FT ADLT (ELECTROSURGICAL) ×1 IMPLANT
GLOVE BIO SURGEON STRL SZ7.5 (GLOVE) ×3 IMPLANT
GLOVE INDICATOR 8.0 STRL GRN (GLOVE) ×3 IMPLANT
GOWN STRL REUS W/ TWL LRG LVL3 (GOWN DISPOSABLE) ×2 IMPLANT
GOWN STRL REUS W/TWL LRG LVL3 (GOWN DISPOSABLE) ×4
HARMONIC SCALPEL FOCUS (MISCELLANEOUS) IMPLANT
KIT RM TURNOVER STRD PROC AR (KITS) ×2 IMPLANT
LABEL OR SOLS (LABEL) ×2 IMPLANT
NDL HYPO 25X1 1.5 SAFETY (NEEDLE) ×1 IMPLANT
NDL SAFETY 22GX1.5 (NEEDLE) ×2 IMPLANT
NEEDLE HYPO 25X1 1.5 SAFETY (NEEDLE) ×2 IMPLANT
PACK BASIN MINOR ARMC (MISCELLANEOUS) ×2 IMPLANT
PAD DRESSING TELFA 3X8 NADH (GAUZE/BANDAGES/DRESSINGS) IMPLANT
PAD OB MATERNITY 4.3X12.25 (PERSONAL CARE ITEMS) ×2 IMPLANT
PAD PREP 24X41 OB/GYN DISP (PERSONAL CARE ITEMS) ×2 IMPLANT
SURGILUBE 2OZ TUBE FLIPTOP (MISCELLANEOUS) ×2 IMPLANT
SUT CHROMIC 3 0 SH 27 (SUTURE) ×2 IMPLANT
SUT ETHILON 4-0 (SUTURE) ×2
SUT ETHILON 4-0 FS2 18XMFL BLK (SUTURE) ×1
SUT MNCRL 4-0 VIOLET P-3 (SUTURE) IMPLANT
SUT MONOCRYL 4-0 (SUTURE) ×1
SUT SILK 0 CT 1 30 (SUTURE) ×2 IMPLANT
SUT VIC AB 3-0 SH 27 (SUTURE) ×6
SUT VIC AB 3-0 SH 27X BRD (SUTURE) IMPLANT
SUTURE ETHLN 4-0 FS2 18XMF BLK (SUTURE) IMPLANT
SYR CONTROL 10ML (SYRINGE) ×3 IMPLANT

## 2016-11-03 NOTE — Anesthesia Procedure Notes (Signed)
Procedure Name: LMA Insertion Performed by: Lance Muss Pre-anesthesia Checklist: Patient identified, Patient being monitored, Timeout performed, Emergency Drugs available and Suction available Patient Re-evaluated:Patient Re-evaluated prior to inductionOxygen Delivery Method: Circle system utilized Preoxygenation: Pre-oxygenation with 100% oxygen Intubation Type: IV induction LMA: LMA inserted LMA Size: 4.0 Tube type: Oral Number of attempts: 1 Placement Confirmation: positive ETCO2 and breath sounds checked- equal and bilateral Tube secured with: Tape Dental Injury: Teeth and Oropharynx as per pre-operative assessment

## 2016-11-03 NOTE — H&P (Signed)
No change in clinical history or exam since yesterday. For hemorrhoidectomy, possible fissurectomy/sphincterotomy.

## 2016-11-03 NOTE — Op Note (Signed)
Preoperative diagnosis: External hemorrhoids, difficult perianal care, possible anal fissure.  Postoperative diagnosis, redundant external anal skin, external hemorrhoids, perineal ulcer.  Operative procedure: Exam under anesthesia, anoscopy, excision perineal ulcer with primary closure, excision of redundant external anal skin.  Anesthesia: Gen. by LMA, Exparel, 20 mL; Marcaine 0.25% plain, 30 mL.  Estimated blood loss: 30 mL.  Clinical note: This 45 year old paraplegic has developed a redundant external anal skin. He's also developed a ulcerated area just posterior to the anus. He is admitted for excision. He received Invanz prior to the procedure.  Operative note: With SCD stockings and placed into a general anesthesia. He was placed in lithotomy position. The perineum was prepped with Betadine solution and draped. The above-mentioned local anesthetic was infiltrated for postoperative analgesia. What was thought to possibly represent an anal fissure on photography internal was a perineal ulcer. This was excised through an elliptical incision. The skin was incised sharply and the remaining dissection completed with the Harmonic scalpel. The specimen was orientated with a long suture towards the anus, short suture towards the gluteal cleft. The wound was closed with 3-0 Vicryl sutures to the adipose layer and a running 4-0 Monocryl suture the skin.  Attention was turned to the redundant anal tissue. Anoscopy showed very little hemorrhoidal disease, more redundant neck sternal anal skin. This was resected in 4 quadrants with hemostasis achieved by the Harmonic Scalpel. The wounds were closed with running locking 3-0 Vicryl and chromic sutures. There is no change in the tension on the anus at the end of the procedure. Scant bleeding was noted during this portion of the procedure. A pad was placed and the patient taken to recovery room in stable condition.

## 2016-11-03 NOTE — Transfer of Care (Signed)
Immediate Anesthesia Transfer of Care Note  Patient: [name] [place]  Procedure(s) Performed: Procedure(s): HEMORRHOIDECTOMY (N/A)  Patient Location: PACU  Anesthesia Type:General  Level of Consciousness: awake, alert  and responds to stimulation  Airway & Oxygen Therapy: Patient Spontanous Breathing and Patient connected to face mask oxygen  Post-op Assessment: Report given to RN and Post -op Vital signs reviewed and stable  Post vital signs: Reviewed and stable  Last Vitals:  Vitals:   11/03/16 0622 11/03/16 0846  BP: (!) 127/58 (!) 148/102  Pulse: 60 70  Resp: 20 (!) 9  Temp: 36.6 C     Last Pain: There were no vitals filed for this visit.       Complications: No apparent anesthesia complications

## 2016-11-03 NOTE — Anesthesia Preprocedure Evaluation (Signed)
Anesthesia Evaluation  Patient identified by MRN, date of birth, ID band Patient awake    Reviewed: Allergy & Precautions, NPO status , Patient's Chart, lab work & pertinent test results, reviewed documented beta blocker date and time   Airway Mallampati: II  TM Distance: >3 FB     Dental  (+) Chipped   Pulmonary           Cardiovascular      Neuro/Psych PSYCHIATRIC DISORDERS Depression    GI/Hepatic   Endo/Other  diabetes, Type 1  Renal/GU Renal InsufficiencyRenal disease     Musculoskeletal   Abdominal   Peds  Hematology  (+) anemia ,   Anesthesia Other Findings   Reproductive/Obstetrics                             Anesthesia Physical Anesthesia Plan  ASA: III  Anesthesia Plan: General   Post-op Pain Management:    Induction: Intravenous  Airway Management Planned: LMA  Additional Equipment:   Intra-op Plan:   Post-operative Plan:   Informed Consent: I have reviewed the patients History and Physical, chart, labs and discussed the procedure including the risks, benefits and alternatives for the proposed anesthesia with the patient or authorized representative who has indicated his/her understanding and acceptance.     Plan Discussed with: CRNA  Anesthesia Plan Comments:         Anesthesia Quick Evaluation

## 2016-11-03 NOTE — Discharge Instructions (Signed)
  AMBULATORY SURGERY  DISCHARGE INSTRUCTIONS   1) The drugs that you were given will stay in your system until tomorrow so for the next 24 hours you should not:  A) Drive an automobile B) Make any legal decisions C) Drink any alcoholic beverage   2) You may resume regular meals tomorrow.  Today it is better to start with liquids and gradually work up to solid foods.  You may eat anything you prefer, but it is better to start with liquids, then soup and crackers, and gradually work up to solid foods.   3) Please notify your doctor immediately if you have any unusual bleeding, trouble breathing, redness and pain at the surgery site, drainage, fever, or pain not relieved by medication.    4) Additional Instructions: TAKE A STOOL SOFTENER TWICE A DAY WHILE TAKING NARCOTIC PAIN MEDICINE TO PREVENT CONSTIPATION   Please contact your physician with any problems or Same Day Surgery at [phone], Monday through Friday 6 am to 4 pm, or [facility] [place] [facility] number at [phone].   

## 2016-11-03 NOTE — Anesthesia Postprocedure Evaluation (Signed)
Anesthesia Post Note  Patient: [name]  Procedure(s) Performed: Procedure(s) (LRB): HEMORRHOIDECTOMY (N/A)  Patient location during evaluation: PACU Anesthesia Type: General Level of consciousness: awake and alert Pain management: pain level controlled Vital Signs Assessment: post-procedure vital signs reviewed and stable Respiratory status: spontaneous breathing, nonlabored ventilation, respiratory function stable and patient connected to nasal cannula oxygen Cardiovascular status: blood pressure returned to baseline and stable Postop Assessment: no signs of nausea or vomiting Anesthetic complications: no    Last Vitals:  Vitals:   11/03/16 1000 11/03/16 1008  BP:  117/79  Pulse: 81 60  Resp:  16  Temp:  36.5 C    Last Pain:  Vitals:   11/03/16 1008  TempSrc: Temporal  PainSc: [place]

## 2016-11-04 LAB — SURGICAL PATHOLOGY

## 2016-11-05 ENCOUNTER — Telehealth: Payer: Self-pay | Admitting: *Deleted

## 2016-11-05 ENCOUNTER — Encounter: Payer: Self-pay | Admitting: *Deleted

## 2016-11-05 NOTE — Telephone Encounter (Signed)
-----   Message from Robert Bellow, MD sent at 11/04/2016  7:38 PM EST ----- Please notify the patient tissue removed all OK.  See how he is doing re: pain/ bowel movements. Hemorrhoidectomy on November 28. ----- Message ----- From: Interface, Lab In Three Zero One Sent: 11/04/2016   5:37 PM To: Robert Bellow, MD

## 2016-11-05 NOTE — Telephone Encounter (Signed)
Notified patient as instructed, patient pleased. Discussed follow-up appointments, patient agrees He states a little more pain today due to the BM but over he is "good". Minimal bleeding.

## 2016-11-08 ENCOUNTER — Encounter: Payer: Self-pay | Admitting: General Surgery

## 2016-11-09 ENCOUNTER — Ambulatory Visit (INDEPENDENT_AMBULATORY_CARE_PROVIDER_SITE_OTHER): Payer: Managed Care, Other (non HMO) | Admitting: General Surgery

## 2016-11-09 ENCOUNTER — Encounter: Payer: Self-pay | Admitting: General Surgery

## 2016-11-09 VITALS — BP 120/72 | HR 62 | Resp 13 | Ht 72.0 in | Wt 175.0 lb

## 2016-11-09 DIAGNOSIS — L98491 Non-pressure chronic ulcer of skin of other sites limited to breakdown of skin: Secondary | ICD-10-CM

## 2016-11-09 DIAGNOSIS — K644 Residual hemorrhoidal skin tags: Secondary | ICD-10-CM

## 2016-11-09 MED ORDER — HYDROCODONE-ACETAMINOPHEN 5-325 MG PO TABS: 1.0000 | ORAL_TABLET | Freq: Four times a day (QID) | ORAL | 0 refills | Status: DC | PRN

## 2016-11-09 NOTE — Patient Instructions (Signed)
Return
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
## 2016-11-11 ENCOUNTER — Ambulatory Visit: Payer: Managed Care, Other (non HMO) | Admitting: General Surgery

## 2016-11-16 NOTE — Addendum Note (Signed)
Addendum  created 11/16/16 0754 by Gunnar Bulla, MD   Sign clinical note

## 2016-11-16 NOTE — Anesthesia Postprocedure Evaluation (Signed)
Anesthesia Post Note  Patient: [name]  Procedure(s) Performed: Procedure(s) (LRB): HEMORRHOIDECTOMY (N/A)  Patient location during evaluation: PACU Anesthesia Type: General Level of consciousness: awake and alert Pain management: pain level controlled Vital Signs Assessment: post-procedure vital signs reviewed and stable Respiratory status: spontaneous breathing, nonlabored ventilation, respiratory function stable and patient connected to nasal cannula oxygen Cardiovascular status: blood pressure returned to baseline and stable Postop Assessment: no signs of nausea or vomiting Anesthetic complications: no    Last Vitals:  Vitals:   11/03/16 1000 11/03/16 1008  BP:  117/79  Pulse: 81 60  Resp:  16  Temp:  36.5 C    Last Pain:  Vitals:   11/04/16 1340  TempSrc:   PainSc: [place]

## 2016-11-17 ENCOUNTER — Telehealth: Payer: Self-pay | Admitting: *Deleted

## 2016-11-17 ENCOUNTER — Encounter: Payer: Self-pay | Admitting: Internal Medicine

## 2016-11-17 NOTE — Telephone Encounter (Signed)
Patient had called concerned about the ulcerated area getting larger with drainage. Dr Bary Castilla aware. Advised to keep the area as dry as possible and follow up on Thursday as scheduled.

## 2016-11-19 ENCOUNTER — Encounter: Payer: Self-pay | Admitting: General Surgery

## 2016-11-19 ENCOUNTER — Ambulatory Visit (INDEPENDENT_AMBULATORY_CARE_PROVIDER_SITE_OTHER): Payer: Managed Care, Other (non HMO) | Admitting: General Surgery

## 2016-11-19 VITALS — BP 122/74 | HR 74 | Temp 97.4°F | Resp 12 | Ht 72.0 in | Wt 175.0 lb

## 2016-11-19 DIAGNOSIS — K644 Residual hemorrhoidal skin tags: Secondary | ICD-10-CM

## 2016-11-19 DIAGNOSIS — L98491 Non-pressure chronic ulcer of skin of other sites limited to breakdown of skin: Secondary | ICD-10-CM
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
## 2016-11-19 NOTE — Patient Instructions (Addendum)
The patient is aware to call back for any questions or concerns. oral zinc 23m tablets once daily.

## 2016-11-20 ENCOUNTER — Other Ambulatory Visit: Payer: Managed Care, Other (non HMO)

## 2016-11-27 ENCOUNTER — Ambulatory Visit: Payer: Managed Care, Other (non HMO) | Admitting: Internal Medicine

## 2016-12-08 ENCOUNTER — Encounter: Payer: Self-pay | Admitting: *Deleted

## 2016-12-09 ENCOUNTER — Other Ambulatory Visit (INDEPENDENT_AMBULATORY_CARE_PROVIDER_SITE_OTHER): Payer: Managed Care, Other (non HMO)

## 2016-12-09 DIAGNOSIS — E109 Type 1 diabetes mellitus without complications: Secondary | ICD-10-CM

## 2016-12-09 LAB — HEPATIC FUNCTION PANEL
ALBUMIN: 4 g/dL (ref 3.5–5.2)
ALT: 15 U/L (ref 0–53)
AST: 18 U/L (ref 0–37)
Alkaline Phosphatase: 94 U/L (ref 39–117)
Bilirubin, Direct: 0.1 mg/dL (ref 0.0–0.3)
TOTAL PROTEIN: 6.6 g/dL (ref 6.0–8.3)
Total Bilirubin: 0.6 mg/dL (ref 0.2–1.2)

## 2016-12-09 LAB — BASIC METABOLIC PANEL
BUN: 6 mg/dL (ref 6–23)
CHLORIDE: 94 meq/L — AB (ref 96–112)
CO2: 29 mEq/L (ref 19–32)
CREATININE: 0.72 mg/dL (ref 0.40–1.50)
Calcium: 9.3 mg/dL (ref 8.4–10.5)
GFR: 124.51 mL/min (ref >60.00)
Glucose, Bld: 154 mg/dL — ABNORMAL HIGH (ref 70–99)
Potassium: 4.5 mEq/L (ref 3.5–5.1)
Sodium: 131 mEq/L — ABNORMAL LOW (ref 135–145)

## 2016-12-09 LAB — HEMOGLOBIN A1C: HEMOGLOBIN A1C: 7.1 % — AB (ref 4.6–6.5)

## 2016-12-09 LAB — MICROALBUMIN / CREATININE URINE RATIO
CREATININE, U: 34.6 mg/dL
Microalb Creat Ratio: 2 mg/g (ref 0.0–30.0)

## 2016-12-09 LAB — LIPID PANEL
CHOL/HDL RATIO: 2
Cholesterol: 162 mg/dL (ref 0–200)
HDL: 72.4 mg/dL (ref >39.00)
LDL Cholesterol: 77 mg/dL (ref 0–99)
NONHDL: 90.03
TRIGLYCERIDES: 63 mg/dL (ref 0.0–149.0)
VLDL: 12.6 mg/dL (ref 0.0–40.0)

## 2016-12-09 NOTE — Addendum Note (Signed)
Addended by: Leeanne Rio on: 12/09/2016 10:06 AM   Modules accepted: Orders

## 2016-12-10 ENCOUNTER — Ambulatory Visit (INDEPENDENT_AMBULATORY_CARE_PROVIDER_SITE_OTHER): Payer: Managed Care, Other (non HMO) | Admitting: General Surgery

## 2016-12-10 ENCOUNTER — Encounter: Payer: Self-pay | Admitting: General Surgery

## 2016-12-10 ENCOUNTER — Encounter: Payer: Self-pay | Admitting: Internal Medicine

## 2016-12-10 VITALS — BP 124/72 | HR 72 | Resp 14 | Ht 72.0 in | Wt 175.0 lb

## 2016-12-10 DIAGNOSIS — L98491 Non-pressure chronic ulcer of skin of other sites limited to breakdown of skin: Secondary | ICD-10-CM

## 2016-12-10 NOTE — Patient Instructions (Addendum)
The patient is aware to call back for any questions or concerns. Return in one month
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
## 2016-12-25 ENCOUNTER — Other Ambulatory Visit: Payer: Self-pay | Admitting: Internal Medicine

## 2017-01-07 ENCOUNTER — Encounter: Payer: Self-pay | Admitting: *Deleted

## 2017-01-07 ENCOUNTER — Ambulatory Visit (INDEPENDENT_AMBULATORY_CARE_PROVIDER_SITE_OTHER): Payer: Managed Care, Other (non HMO) | Admitting: General Surgery

## 2017-01-07 VITALS — BP 162/98 | HR 70 | Resp 12 | Ht 72.0 in | Wt 175.0 lb

## 2017-01-07 DIAGNOSIS — L98491 Non-pressure chronic ulcer of skin of other sites limited to breakdown of skin: Secondary | ICD-10-CM

## 2017-01-07 NOTE — Patient Instructions (Addendum)
.   Send [name] a my chart message in 10 days.
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
## 2017-01-08 ENCOUNTER — Ambulatory Visit: Payer: Managed Care, Other (non HMO) | Admitting: Internal Medicine

## 2017-01-08 ENCOUNTER — Telehealth: Payer: Self-pay

## 2017-01-08 NOTE — Telephone Encounter (Signed)
You can notify pt that we received notice that he received another flu shot.  Should be fine.  Can monitor for any reaction - I.e local reaction, etc.

## 2017-01-08 NOTE — Telephone Encounter (Signed)
Pt received flu shot at our office on 12-19-16. We received fax from [place] today (put in your box for review) that he received another one from them on 01-07-17. Is there anything that I need to do for patient?

## 2017-01-11 NOTE — Telephone Encounter (Signed)
Pt informed he states he did not receive flu in our office.

## 2017-01-14 ENCOUNTER — Encounter: Payer: Self-pay | Admitting: General Surgery

## 2017-01-19 ENCOUNTER — Other Ambulatory Visit: Payer: Self-pay | Admitting: Internal Medicine

## 2017-01-20 NOTE — Telephone Encounter (Signed)
Pt has f/u with you on 01-25-17 Last o/v was 02-28-16 has had more scheduled but cancelled them  Last script 12/18/16

## 2017-01-25 ENCOUNTER — Ambulatory Visit (INDEPENDENT_AMBULATORY_CARE_PROVIDER_SITE_OTHER): Payer: 59 | Admitting: Internal Medicine

## 2017-01-25 ENCOUNTER — Encounter: Payer: Self-pay | Admitting: Internal Medicine

## 2017-01-25 DIAGNOSIS — E109 Type 1 diabetes mellitus without complications: Secondary | ICD-10-CM | POA: Diagnosis not present

## 2017-01-25 DIAGNOSIS — L98491 Non-pressure chronic ulcer of skin of other sites limited to breakdown of skin: Secondary | ICD-10-CM | POA: Diagnosis not present

## 2017-01-25 DIAGNOSIS — D649 Anemia, unspecified: Secondary | ICD-10-CM

## 2017-01-25 DIAGNOSIS — K625 Hemorrhage of anus and rectum: Secondary | ICD-10-CM | POA: Diagnosis not present

## 2017-01-25 DIAGNOSIS — G8220 Paraplegia, unspecified: Secondary | ICD-10-CM
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
## 2017-01-25 NOTE — Progress Notes (Signed)
Pre-visit discussion using our clinic review tool. No additional management support is needed unless otherwise documented below in the visit note.  

## 2017-01-26 ENCOUNTER — Ambulatory Visit (INDEPENDENT_AMBULATORY_CARE_PROVIDER_SITE_OTHER): Payer: Managed Care, Other (non HMO) | Admitting: General Surgery

## 2017-01-26 ENCOUNTER — Encounter: Payer: Self-pay | Admitting: Internal Medicine

## 2017-01-26 ENCOUNTER — Encounter: Payer: Self-pay | Admitting: General Surgery

## 2017-01-26 VITALS — BP 104/64 | HR 80 | Resp 12 | Ht 72.0 in | Wt 170.0 lb

## 2017-01-26 DIAGNOSIS — G8220 Paraplegia, unspecified: Secondary | ICD-10-CM

## 2017-01-26 DIAGNOSIS — L98491 Non-pressure chronic ulcer of skin of other sites limited to breakdown of skin: Secondary | ICD-10-CM

## 2017-01-26 DIAGNOSIS — E109 Type 1 diabetes mellitus without complications: Secondary | ICD-10-CM

## 2017-01-26 NOTE — Assessment & Plan Note (Signed)
Very functional.  Follow.

## 2017-01-26 NOTE — Patient Instructions (Signed)
Return in two week

## 2017-01-26 NOTE — Assessment & Plan Note (Signed)
Persistent wound - peri rectal region.  Seeing Dr Bary Castilla.  Planning to f/u this week.  Plans to discuss the bleeding, etc.  Per pt, they are in the process of arranging an appt with plastic surgery.  He also has an ulceration on his heel.  Discussed referral to wound clinic.  He wants to hold on referral until knows plans with plastic surgeon.  Try to keep pressure off area.
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
## 2017-01-26 NOTE — Assessment & Plan Note (Signed)
States sugars are doing better overall.  Discussed low sugars. States occurs if gives himself too much insulin.  Follow.  Last a1c 7.1.

## 2017-01-26 NOTE — Assessment & Plan Note (Signed)
Bleeding as outlined.  Occurs when has to digitally stimulate his bowels.  Declined exam.  Plans to f/u with Dr Bary Castilla.

## 2017-01-26 NOTE — Assessment & Plan Note (Signed)
He declined f/u cbc today.

## 2017-01-28 NOTE — Telephone Encounter (Signed)
Have sent copy of my chart message to patient in mail.

## 2017-01-28 NOTE — Telephone Encounter (Signed)
I have placed the order for the referral to wound center.  He does not want to go to [facility] wound care.  See referral note.  Needs appt asap.  Is paraplegic.  Is diabetic.  Has peri rectal skin ulcer and heel ulcerationg.

## 2017-02-15 ENCOUNTER — Ambulatory Visit: Payer: Managed Care, Other (non HMO) | Admitting: General Surgery

## 2017-02-17 ENCOUNTER — Ambulatory Visit (INDEPENDENT_AMBULATORY_CARE_PROVIDER_SITE_OTHER): Payer: 59 | Admitting: General Surgery

## 2017-02-17 ENCOUNTER — Encounter: Payer: Self-pay | Admitting: General Surgery

## 2017-02-17 VITALS — BP 130/74 | HR 75 | Resp 14 | Wt 170.0 lb

## 2017-02-17 DIAGNOSIS — L98491 Non-pressure chronic ulcer of skin of other sites limited to breakdown of skin: Secondary | ICD-10-CM
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
## 2017-03-12 ENCOUNTER — Other Ambulatory Visit: Payer: Self-pay

## 2017-03-12 ENCOUNTER — Encounter: Payer: Medicare Other | Attending: Surgery | Admitting: Surgery

## 2017-03-12 DIAGNOSIS — Z888 Allergy status to other drugs, medicaments and biological substances status: Secondary | ICD-10-CM | POA: Insufficient documentation

## 2017-03-12 DIAGNOSIS — E10622 Type 1 diabetes mellitus with other skin ulcer: Secondary | ICD-10-CM | POA: Insufficient documentation

## 2017-03-12 DIAGNOSIS — Z79899 Other long term (current) drug therapy: Secondary | ICD-10-CM | POA: Diagnosis not present

## 2017-03-12 DIAGNOSIS — L8943 Pressure ulcer of contiguous site of back, buttock and hip, stage 3: Secondary | ICD-10-CM | POA: Insufficient documentation

## 2017-03-12 DIAGNOSIS — T8131XA Disruption of external operation (surgical) wound, not elsewhere classified, initial encounter: Secondary | ICD-10-CM | POA: Diagnosis not present

## 2017-03-12 DIAGNOSIS — Z882 Allergy status to sulfonamides status: Secondary | ICD-10-CM | POA: Insufficient documentation

## 2017-03-12 DIAGNOSIS — Z794 Long term (current) use of insulin: Secondary | ICD-10-CM | POA: Diagnosis not present

## 2017-03-12 DIAGNOSIS — G8254 Quadriplegia, C5-C7 incomplete: Secondary | ICD-10-CM | POA: Diagnosis not present

## 2017-03-12 DIAGNOSIS — Z91041 Radiographic dye allergy status: Secondary | ICD-10-CM | POA: Diagnosis not present

## 2017-03-12 DIAGNOSIS — Y839 Surgical procedure, unspecified as the cause of abnormal reaction of the patient, or of later complication, without mention of misadventure at the time of the procedure: Secondary | ICD-10-CM | POA: Diagnosis not present

## 2017-03-12 MED ORDER — INSULIN DETEMIR 100 UNIT/ML ~~LOC~~ SOLN: SUBCUTANEOUS | 1 refills | Status: DC

## 2017-03-13 NOTE — Progress Notes (Addendum)
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 -------------------------------------------------------------------------------- SuperBill Details Patient Name: [name]. Date of Service: 03/12/2017 Medical Record Number: [id number] Patient Account Number: [id number] Date of Birth/Sex: 11/21/1971 (46 y.o. 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, buttock and hip, stage 3 Disruption of external operation (surgical) wound, not elsewhere classified, initial T81.31XA encounter Facility Procedures CPT4 Code: [id number] Description: 99213 - WOUND CARE VISIT-LEV 3 EST PT Modifier: Quantity: 1 Physician Procedures CPT4: Description Modifier Quantity Code [id number] 99214 - WC PHYS LEVEL 4 - EST PT 1 ICD-10 Description  Diagnosis E10.622 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

## 2017-03-13 NOTE — Progress Notes (Signed)
KUPER, RENNELS ([id number]) Visit Report for 03/12/2017 Allergy List Details Patient Name: [name], [name]. Date of Service: 03/12/2017 12:45 PM Medical Record Number: [id number] Patient Account Number: [id number] Date of Birth/Sex: 08/12/1971 (46 y.o. Male) Treating RN: [name] Gouty, RN, BSN, Velva Harman Primary Care Noel Henandez: Einar Pheasant Other Clinician: Referring Lyan Holck: Einar Pheasant Treating Quadry Kampa/Extender: Frann Rider in Treatment: 0 Allergies Active Allergies Sulfa (Sulfonamide Antibiotics) Reaction: tongue swells Iodinated Contrast Media - IV Dye decongestant Allergy Notes Electronic Signature(s) Signed: 03/12/2017 3:18:42 PM By: Regan Lemming BSN, RN Entered By: Regan Lemming on 03/12/2017 12:54:05 [place], [name] ([id number]) -------------------------------------------------------------------------------- Arrival Information Details Patient Name: [name]. Date of Service: 03/12/2017 12:45 PM Medical Record Number: [id number] Patient Account Number: [id number] Date of Birth/Sex: 01/24/1971 (46 y.o. Male) Treating RN: [name] Gouty, RN, BSN, [place] Primary Care Arta Stump: Einar Pheasant Other Clinician: Referring Waymon Laser: Einar Pheasant Treating Brittnae Aschenbrenner/Extender: Frann Rider in Treatment: 0 Visit Information Patient Arrived: Wheel Chair Arrival Time: 12:49 Accompanied By: self Transfer Assistance: None Patient Identification Verified: Yes Secondary Verification Process Yes Completed: Patient Requires Transmission-Based No Precautions: Patient Has Alerts: No History Since Last Visit All ordered tests and consults were completed: No Added or deleted any medications: No Any new allergies or adverse reactions: No Had a fall or experienced change in activities of daily living that may affect risk of falls: No Signs or symptoms of abuse/neglect since last visito No Hospitalized since last visit: No Electronic Signature(s) Signed: 03/12/2017 3:18:42 PM By: Regan Lemming BSN, RN Entered By: Regan Lemming on 03/12/2017 12:52:22 Schuermann, [name] ([id number]) -------------------------------------------------------------------------------- Clinic Level of Care Assessment Details Patient Name: [name]. Date of Service: 03/12/2017 12:45 PM Medical Record Number: [id number] Patient Account Number: [id number] Date of Birth/Sex: [birth year] (46 y.o. Male) Treating RN: [name] Gouty, RN, BSN, Velva Harman Primary Care Maleeka Sabatino: Einar Pheasant Other Clinician: Referring Sharece Fleischhacker: Einar Pheasant Treating Anaisabel Pederson/Extender: Frann Rider in Treatment: 0 Clinic Level of Care Assessment Items TOOL 2 Quantity Score []  - Use when only an EandM is performed on the INITIAL visit 0 ASSESSMENTS - Nursing Assessment / Reassessment X - General Physical Exam (combine w/ comprehensive assessment (listed just 1 20 below) when performed on new pt. evals) X - Comprehensive Assessment (HX, ROS, Risk Assessments, Wounds Hx, etc.) 1 25 ASSESSMENTS - Wound and Skin Assessment / Reassessment X - Simple Wound Assessment / Reassessment - one wound 1 5 []  - Complex Wound Assessment / Reassessment - multiple wounds 0 []  - Dermatologic / Skin Assessment (not related to wound area) 0 ASSESSMENTS - Ostomy and/or Continence Assessment and Care []  - Incontinence Assessment and Management 0 []  - Ostomy Care Assessment and Management (repouching, etc.) 0 PROCESS - Coordination of Care X - Simple Patient / Family Education for ongoing care 1 15 []  - Complex (extensive) Patient / Family Education for ongoing care 0 []  - Staff obtains Programmer, systems, Records, Test Results / Process Orders 0 []  - Staff telephones HHA, Nursing Homes / Clarify orders / etc 0 []  - Routine Transfer to another Facility (non-emergent condition) 0 []  - Routine Hospital Admission (non-emergent condition) 0 X - New Admissions / Biomedical engineer / Ordering NPWT, Apligraf, etc. 1 15 []  - Emergency Hospital Admission  (emergent condition) 0 []  - Simple Discharge Coordination 0 Roseman, [name] ([id number]) []  - Complex (extensive) Discharge Coordination 0 PROCESS - Special Needs []  - Pediatric / Minor Patient Management 0 []  - Isolation Patient Management 0 []  - Hearing / Language / Visual special needs 0 []  -  Assessment of Community assistance (transportation, D/C planning, etc.) 0 []  - Additional assistance / Altered mentation 0 []  - Support Surface(s) Assessment (bed, cushion, seat, etc.) 0 INTERVENTIONS - Wound Cleansing / Measurement X - Wound Imaging (photographs - any number of wounds) 1 5 []  - Wound Tracing (instead of photographs) 0 X - Simple Wound Measurement - one wound 1 5 []  - Complex Wound Measurement - multiple wounds 0 X - Simple Wound Cleansing - one wound 1 5 []  - Complex Wound Cleansing - multiple wounds 0 INTERVENTIONS - Wound Dressings X - Small Wound Dressing one or multiple wounds 1 10 []  - Medium Wound Dressing one or multiple wounds 0 []  - Large Wound Dressing one or multiple wounds 0 []  - Application of Medications - injection 0 INTERVENTIONS - Miscellaneous []  - External ear exam 0 []  - Specimen Collection (cultures, biopsies, blood, body fluids, etc.) 0 []  - Specimen(s) / Culture(s) sent or taken to Lab for analysis 0 []  - Patient Transfer (multiple staff / Civil Service fast streamer / Similar devices) 0 []  - Simple Staple / Suture removal (25 or less) 0 []  - Complex Staple / Suture removal (26 or more) 0 Arriaga, [name] ([id number]) []  - Hypo / Hyperglycemic Management (close monitor of Blood Glucose) 0 []  - Ankle / Brachial Index (ABI) - do not check if billed separately 0 Has the patient been seen at the hospital within the last three years: Yes Total Score: 105 Level Of Care: New/Established - Level 3 Electronic Signature(s) Signed: 03/12/2017 3:18:42 PM By: Regan Lemming BSN, RN Entered By: Regan Lemming on 03/12/2017 13:22:27 Ad[place], [name]  ([id number]) -------------------------------------------------------------------------------- Encounter Discharge Information Details Patient Name: [name]. Date of Service: 03/12/2017 12:45 PM Medical Record Number: [id number] Patient Account Number: [id number] Date of Birth/Sex: 1971/07/23 (46 y.o. Male) Treating RN: [name] Gouty, RN, BSN, Velva Harman Primary Care Maegan Buller: Einar Pheasant Other Clinician: Referring Johnetta Sloniker: Einar Pheasant Treating Kupono Marling/Extender: Frann Rider in Treatment: 0 Encounter Discharge Information Items Discharge Pain Level: 0 Discharge Condition: Stable Ambulatory Status: Wheelchair Discharge Destination: Home Transportation: Private Auto Accompanied By: self Schedule Follow-up Appointment: No Medication Reconciliation completed No and provided to Patient/Care Zeola Brys: Provided on Clinical Summary of Care: 03/12/2017 Form Type Recipient Paper Patient SR Electronic Signature(s) Signed: 03/12/2017 1:53:44 PM By: Regan Lemming BSN, RN Previous Signature: 03/12/2017 1:43:25 PM Version By: Ruthine Dose Entered By: Regan Lemming on 03/12/2017 13:53:44 Kendricks, [name] ([id number]) -------------------------------------------------------------------------------- Lower Extremity Assessment Details Patient Name: [name]. Date of Service: 03/12/2017 12:45 PM Medical Record Number: [id number] Patient Account Number: [id number] Date of Birth/Sex: 04/03/1971 (46 y.o. Male) Treating RN: [name] Gouty, RN, BSN, [place] Primary Care Malayiah Mcbrayer: Einar Pheasant Other Clinician: Referring Terique Kawabata: Einar Pheasant Treating Wilfred Dayrit/Extender: Frann Rider in Treatment: 0 Electronic Signature(s) Signed: 03/12/2017 3:18:42 PM By: Regan Lemming BSN, RN Entered By: Regan Lemming on 03/12/2017 12:55:59 Boettcher, [name] ([id number]) -------------------------------------------------------------------------------- Multi Wound Chart Details Patient Name: [name]. Date of  Service: 03/12/2017 12:45 PM Medical Record Number: [id number] Patient Account Number: [id number] Date of Birth/Sex: 02/26/1971 (46 y.o. Male) Treating RN: [name] Gouty, RN, BSN, Velva Harman Primary Care Jaimeson Gopal: Einar Pheasant Other Clinician: Referring Haskell Rihn: Einar Pheasant Treating Nature Vogelsang/Extender: Frann Rider in Treatment: 0 Vital Signs Height(in): 72 Pulse(bpm): 68 Weight(lbs): 170 Blood Pressure 133/77 (mmHg): Body Mass Index(BMI): 23 Temperature(F): 97.9 Respiratory Rate 16 (breaths/min): Photos: [4:No Photos] [N/A:N/A] Wound Location: [4:Sacrum - Medial] [N/A:N/A] Wounding Event: [4:Surgical Injury] [N/A:N/A] Primary Etiology: [4:Open Surgical Wound] [N/A:N/A] Comorbid History: [4:Type I Diabetes, History  of N/A pressure wounds, Paraplegia] Date Acquired: [4:10/21/2016] [N/A:N/A] Weeks of Treatment: [4:0] [N/A:N/A] Wound Status: [4:Open] [N/A:N/A] Measurements L x W x D 3x2.5x0.2 [N/A:N/A] (cm) Area (cm) : [4:5.89] [N/A:N/A] Volume (cm) : [4:1.178] [N/A:N/A] % Reduction in Area: [4:0.00%] [N/A:N/A] % Reduction in Volume: 0.00% [N/A:N/A] Classification: [4:Full Thickness Without Exposed Support Structures] [N/A:N/A] Exudate Amount: [4:Medium] [N/A:N/A] Exudate Type: [4:Serosanguineous] [N/A:N/A] Exudate Color: [4:red, brown] [N/A:N/A] Wound Margin: [4:Distinct, outline attached N/A] Granulation Amount: [4:Large (67-100%)] [N/A:N/A] Granulation Quality: [4:Pink, Pale] [N/A:N/A] Necrotic Amount: [4:Small (1-33%)] [N/A:N/A] Exposed Structures: [4:Fascia: No Fat Layer (Subcutaneous Tissue) Exposed: No Tendon: No] [N/A:N/A] Muscle: No Joint: No Bone: No Limited to Skin Breakdown Epithelialization: None N/A N/A Periwound Skin Texture: Excoriation: No N/A N/A Induration: No Callus: No Crepitus: No Rash: No Scarring: No Periwound Skin Maceration: Yes N/A N/A Moisture: Dry/Scaly: No Periwound Skin Color: Atrophie Blanche: No N/A N/A Cyanosis:  No Ecchymosis: No Erythema: No Hemosiderin Staining: No Mottled: No Pallor: No Rubor: No Temperature: No Abnormality N/A N/A Tenderness on No N/A N/A Palpation: Wound Preparation: Ulcer Cleansing: N/A N/A Rinsed/Irrigated with Saline Topical Anesthetic Applied: Other: lidocaine 4% Treatment Notes Electronic Signature(s) Signed: 03/12/2017 1:50:37 PM By: Christin Fudge MD, FACS Entered By: Christin Fudge on 03/12/2017 13:50:37 [place], [name] ([id number]) -------------------------------------------------------------------------------- [facility] Details Patient Name: [name]. Date of Service: 03/12/2017 12:45 PM Medical Record Number: [id number] Patient Account Number: [id number] Date of Birth/Sex: [birth year] (45 y.o. Male) Treating RN: [name] Gouty, RN, BSN, [facility] Harman Primary Care Jamileth Putzier: Einar Pheasant Other Clinician: Referring Roxas Clymer: Einar Pheasant Treating Deshay Kirstein/Extender: Frann Rider in Treatment: 0 Active Inactive ` Orientation to the Wound Care Program Nursing Diagnoses: Knowledge deficit related to the wound healing center program Goals: Patient/caregiver will verbalize understanding of the [place] Program Date Initiated: 03/12/2017 Target Resolution Date: 07/12/2017 Goal Status: Active Interventions: Provide education on orientation to the wound center Notes: ` Wound/Skin Impairment Nursing Diagnoses: Impaired tissue integrity Knowledge deficit related to ulceration/compromised skin integrity Goals: Patient/caregiver will verbalize understanding of skin care regimen Date Initiated: 03/12/2017 Target Resolution Date: 07/12/2017 Goal Status: Active Ulcer/skin breakdown will have a volume reduction of 30% by week 4 Date Initiated: 03/12/2017 Target Resolution Date: 07/12/2017 Goal Status: Active Ulcer/skin breakdown will have a volume reduction of 50% by week 8 Date Initiated: 03/12/2017 Target Resolution Date: 07/12/2017 Goal  Status: Active Ulcer/skin breakdown will have a volume reduction of 80% by week 12 Date Initiated: 03/12/2017 Target Resolution Date: 07/12/2017 Goal Status: Active Ulcer/skin breakdown will heal within 14 weeks SYD, NEWSOME ([id number]) Date Initiated: 03/12/2017 Target Resolution Date: 07/12/2017 Goal Status: Active Interventions: Assess patient/caregiver ability to obtain necessary supplies Assess patient/caregiver ability to perform ulcer/skin care regimen upon admission and as needed Assess ulceration(s) every visit Provide education on ulcer and skin care Treatment Activities: Referred to DME Izekiel Flegel for dressing supplies : 03/12/2017 Skin care regimen initiated : 03/12/2017 Topical wound management initiated : 03/12/2017 Notes: Electronic Signature(s) Signed: 03/12/2017 3:18:42 PM By: Regan Lemming BSN, RN Entered By: Regan Lemming on 03/12/2017 [place], [name] ([id number]) -------------------------------------------------------------------------------- Pain Assessment Details Patient Name: [name]. Date of Service: 03/12/2017 12:45 PM Medical Record Number: [id number] Patient Account Number: [id number] Date of Birth/Sex: 1971/05/11 (46 y.o. Male) Treating RN: [name] Gouty, RN, BSN, Velva Harman Primary Care Josseline Reddin: Einar Pheasant Other Clinician: Referring Emmarae Cowdery: Einar Pheasant Treating Dorrine Montone/Extender: Frann Rider in Treatment: 0 Active Problems Location of Pain Severity and Description of Pain Patient Has Paino No Site Locations With Dressing Change: No  Pain Management and Medication Current Pain Management: Electronic Signature(s) Signed: 03/12/2017 3:18:42 PM By: Regan Lemming BSN, RN Entered By: Regan Lemming on 03/12/2017 12:52:30 Luis, [name] ([id number]) -------------------------------------------------------------------------------- Patient/Caregiver Education Details Patient Name: [name]. Date of Service: 03/12/2017 12:45 PM Medical Record  Number: [id number] Patient Account Number: [id number] Date of Birth/Gender: 1971-06-06 (46 y.o. Male) Treating RN: [name] Gouty, RN, BSN, Velva Harman Primary Care Physician: Einar Pheasant Other Clinician: Referring Physician: Einar Pheasant Treating Physician/Extender: Frann Rider in Treatment: 0 Education Assessment Education Provided To: Patient Education Topics Provided Welcome To [facility]: Methods: Explain/Verbal Responses: State content correctly Wound/Skin Impairment: Methods: Explain/Verbal Responses: State content correctly Electronic Signature(s) Signed: 03/12/2017 3:18:42 PM By: Regan Lemming BSN, RN Entered By: Regan Lemming on 03/12/2017 13:53:58 Carattini, [name] ([id number]) -------------------------------------------------------------------------------- Wound Assessment Details Patient Name: [name]. Date of Service: 03/12/2017 12:45 PM Medical Record Number: [id number] Patient Account Number: [id number] Date of Birth/Sex: 1971/07/20 (46 y.o. Male) Treating RN: [name] Gouty, RN, BSN, [place] Primary Care Anajulia Leyendecker: Einar Pheasant Other Clinician: Referring Nedra Mcinnis: Einar Pheasant Treating Hitoshi Werts/Extender: Frann Rider in Treatment: 0 Wound Status Wound Number: 4 Primary Open Surgical Wound Etiology: Wound Location: Sacrum - Medial Wound Open Wounding Event: Surgical Injury Status: Date Acquired: 10/21/2016 Comorbid Type I Diabetes, History of pressure Weeks Of Treatment: 0 History: wounds, Paraplegia Clustered Wound: No Photos Photo Uploaded By: Regan Lemming on 03/12/2017 16:21:10 Wound Measurements Length: (cm) 3 Width: (cm) 2.5 Depth: (cm) 0.2 Area: (cm) 5.89 Volume: (cm) 1.178 % Reduction in Area: 0% % Reduction in Volume: 0% Epithelialization: None Tunneling: No Undermining: No Wound Description Full Thickness Without Exposed Classification: Support Structures Wound Margin: Distinct, outline  attached Exudate Medium Amount: Exudate Type: Serosanguineous Exudate Color: red, brown Foul Odor After Cleansing: No Slough/Fibrino No Wound Bed Granulation Amount: Large (67-100%) Exposed Structure Granulation Quality: Pink, Pale Fascia Exposed: No Necrotic Amount: Small (1-33%) Fat Layer (Subcutaneous Tissue) Exposed: No Corbit, [name] ([id number]) Necrotic Quality: Adherent Slough Tendon Exposed: No Muscle Exposed: No Joint Exposed: No Bone Exposed: No Limited to Skin Breakdown Periwound Skin Texture Texture Color No Abnormalities Noted: No No Abnormalities Noted: No Callus: No Atrophie Blanche: No Crepitus: No Cyanosis: No Excoriation: No Ecchymosis: No Induration: No Erythema: No Rash: No Hemosiderin Staining: No Scarring: No Mottled: No Pallor: No Moisture Rubor: No No Abnormalities Noted: No Dry / Scaly: No Temperature / Pain Maceration: Yes Temperature: No Abnormality Wound Preparation Ulcer Cleansing: Rinsed/Irrigated with Saline Topical Anesthetic Applied: Other: lidocaine 4%, Treatment Notes Wound #4 (Medial Sacrum) 1. Cleansed with: Clean wound with Normal Saline 4. Dressing Applied: Aquacel Ag 5. Secondary Dressing Applied Bordered Foam Dressing Dry Gauze Electronic Signature(s) Signed: 03/12/2017 3:18:42 PM By: Regan Lemming BSN, RN Entered By: Regan Lemming on 03/12/2017 13:15:16 [place], [name] ([id number]) -------------------------------------------------------------------------------- Vitals Details Patient Name: [name]. Date of Service: 03/12/2017 12:45 PM Medical Record Number: [id number] Patient Account Number: [id number] Date of Birth/Sex: 1970/12/26 (46 y.o. Male) Treating RN: Afful, RN, BSN, [place] Primary Care Joni Colegrove: Einar Pheasant Other Clinician: Referring Keiarah Orlowski: Einar Pheasant Treating Ivionna Verley/Extender: Frann Rider in Treatment: 0 Vital Signs Time Taken: 12:52 Temperature (F): 97.9 Height (in):  72 Pulse (bpm): 68 Source: Stated Respiratory Rate (breaths/min): 16 Weight (lbs): 170 Blood Pressure (mmHg): 133/77 Source: Stated Reference Range: 80 - 120 mg / dl Body Mass Index (BMI): 23.1 Electronic Signature(s) Signed: 03/12/2017 3:18:42 PM By: Regan Lemming BSN, RN Entered By: Regan Lemming on 03/12/2017 12:53:03

## 2017-03-13 NOTE — Progress Notes (Signed)
KASHIF, POOLER ([id number]) Visit Report for 03/12/2017 Abuse/Suicide Risk Screen Details Patient Name: [name], [name]. Date of Service: 03/12/2017 12:45 PM Medical Record Number: [id number] Patient Account Number: [id number] Date of Birth/Sex: March 18, 1971 (46 y.o. Male) Treating RN: [name] Gouty, RN, BSN, Velva Harman Primary Care Marbin Olshefski: Einar Pheasant Other Clinician: Referring Kynnedy Carreno: Einar Pheasant Treating Gildardo Tickner/Extender: Frann Rider in Treatment: 0 Abuse/Suicide Risk Screen Items Answer ABUSE/SUICIDE RISK SCREEN: Has anyone close to you tried to hurt or harm you recentlyo No Do you feel uncomfortable with anyone in your familyo No Has anyone forced you do things that you didnot want to doo No Do you have any thoughts of harming yourselfo No Patient displays signs or symptoms of abuse and/or neglect. No Electronic Signature(s) Signed: 03/12/2017 3:18:42 PM By: Regan Lemming BSN, RN Entered By: Regan Lemming on 03/12/2017 12:54:19 Hard, [name] ([id number]) -------------------------------------------------------------------------------- Activities of Daily Living Details Patient Name: [name]. Date of Service: 03/12/2017 12:45 PM Medical Record Number: [id number] Patient Account Number: [id number] Date of Birth/Sex: 11/27/1971 (46 y.o. Male) Treating RN: [name] Gouty, RN, BSN, Velva Harman Primary Care Thaison Kolodziejski: Einar Pheasant Other Clinician: Referring Yashica Sterbenz: Einar Pheasant Treating Janalee Grobe/Extender: Frann Rider in Treatment: 0 Activities of Daily Living Items Answer Activities of Daily Living (Please select one for each item) Drive Automobile Completely Able Take Medications Completely Able Use Telephone Completely Able Care for Appearance Completely Able Use Toilet Completely Able Bath / Shower Completely Able Dress Self Completely Able Feed Self Completely Able Walk Completely Able Get In / Out Bed Completely Able Housework Completely Able Prepare Meals  Completely [place] for Self Completely Able Electronic Signature(s) Signed: 03/12/2017 3:18:42 PM By: Regan Lemming BSN, RN Entered By: Regan Lemming on 03/12/2017 12:54:38 Mccoll, [name] ([id number]) -------------------------------------------------------------------------------- Education Assessment Details Patient Name: [name]. Date of Service: 03/12/2017 12:45 PM Medical Record Number: [id number] Patient Account Number: [id number] Date of Birth/Sex: 07-06-1971 (46 y.o. Male) Treating RN: [name] Gouty, RN, BSN, Velva Harman Primary Care Bradlee Bridgers: Einar Pheasant Other Clinician: Referring Elven Laboy: Einar Pheasant Treating Coady Train/Extender: Frann Rider in Treatment: 0 Primary Learner Assessed: Patient Learning Preferences/Education Level/Primary Language Learning Preference: Explanation Highest Education Level: College or Above Preferred Language: English Cognitive Barrier Assessment/Beliefs Language Barrier: No Physical Barrier Assessment Impaired Vision: No Impaired Hearing: No Decreased Hand dexterity: No Knowledge/Comprehension Assessment Knowledge Level: High Comprehension Level: High Ability to understand written High instructions: Ability to understand verbal High instructions: Motivation Assessment Anxiety Level: Calm Cooperation: Cooperative Education Importance: Acknowledges Need Interest in Health Problems: Asks Questions Perception: Coherent Willingness to Engage in Self- High Management Activities: Readiness to Engage in Self- High Management Activities: Electronic Signature(s) Signed: 03/12/2017 3:18:42 PM By: Regan Lemming BSN, RN Entered By: Regan Lemming on 03/12/2017 12:55:02 Stirn, [name] ([id number]) -------------------------------------------------------------------------------- Fall Risk Assessment Details Patient Name: [name]. Date of Service: 03/12/2017 12:45 PM Medical Record Number: [id number] Patient  Account Number: [id number] Date of Birth/Sex: 28-Feb-1971 (46 y.o. Male) Treating RN: Afful, RN, BSN, [place] Primary Care Kailen Name: Einar Pheasant Other Clinician: Referring Aydrian Halpin: Einar Pheasant Treating Kasha Howeth/Extender: Frann Rider in Treatment: 0 Fall Risk Assessment Items Have you had 2 or more falls in the last 12 monthso 0 No Have you had any fall that resulted in injury in the last 12 monthso 0 No FALL RISK ASSESSMENT: History of falling - immediate or within 3 months 0 No Secondary diagnosis 15 Yes Ambulatory aid None/bed rest/wheelchair/nurse 0 Yes Crutches/cane/walker 0 No Furniture 0 No IV Access/Saline Lock 0  No Gait/Training Normal/bed rest/immobile 0 No Weak 10 Yes Impaired 20 Yes Mental Status Oriented to own ability 0 Yes Electronic Signature(s) Signed: 03/12/2017 3:18:42 PM By: Regan Lemming BSN, RN Entered By: Regan Lemming on 03/12/2017 12:55:25 Netzley, [name] ([id number]) -------------------------------------------------------------------------------- Foot Assessment Details Patient Name: [name]. Date of Service: 03/12/2017 12:45 PM Medical Record Number: [id number] Patient Account Number: [id number] Date of Birth/Sex: 1971-11-08 (46 y.o. Male) Treating RN: [name] Gouty, RN, BSN, Velva Harman Primary Care Mande Auvil: Einar Pheasant Other Clinician: Referring Torra Pala: Einar Pheasant Treating Lendy Dittrich/Extender: Frann Rider in Treatment: 0 Foot Assessment Items Site Locations + = Sensation present, - = Sensation absent, C = Callus, U = Ulcer R = Redness, W = Warmth, M = Maceration, PU = Pre-ulcerative lesion F = Fissure, S = Swelling, D = Dryness Assessment Right: Left: Other Deformity: No No Prior Foot Ulcer: No No Prior Amputation: No No Charcot Joint: No No Ambulatory Status: Non-ambulatory Assistance Device: Wheelchair Gait: Administrator, arts) Signed: 03/12/2017 3:18:42 PM By: Regan Lemming BSN, RN Entered By: Regan Lemming on  03/12/2017 12:55:45 Crall, [name] ([id number]) -------------------------------------------------------------------------------- Nutrition Risk Assessment Details Patient Name: [name]. Date of Service: 03/12/2017 12:45 PM Medical Record Number: [id number] Patient Account Number: [id number] Date of Birth/Sex: [birth year] (46 y.o. Male) Treating RN: [name] Gouty, RN, BSN, [place] Primary Care Barbera Perritt: Einar Pheasant Other Clinician: Referring Kaleiyah Polsky: Einar Pheasant Treating Talha Iser/Extender: Frann Rider in Treatment: 0 Height (in): 72 Weight (lbs): 170 Body Mass Index (BMI): 23.1 Nutrition Risk Assessment Items NUTRITION RISK SCREEN: I have an illness or condition that made me change the kind and/or 0 No amount of food I eat I eat fewer than two meals per day 0 No I eat few fruits and vegetables, or milk products 0 No I have three or more drinks of beer, liquor or wine almost every day 0 No I have tooth or mouth problems that make it hard for me to eat 0 No I don't always have enough money to buy the food I need 0 No I eat alone most of the time 0 No I take three or more different prescribed or over-the-counter drugs a 0 No day Without wanting to, I have lost or gained 10 pounds in the last six 0 No months I am not always physically able to shop, cook and/or feed myself 0 No Nutrition Protocols Good Risk Protocol 0 No interventions needed Moderate Risk Protocol Electronic Signature(s) Signed: 03/12/2017 3:18:42 PM By: Regan Lemming BSN, RN Entered By: Regan Lemming on 03/12/2017 12:55:31

## 2017-03-19 ENCOUNTER — Encounter: Payer: Medicare Other | Admitting: Surgery

## 2017-03-19 DIAGNOSIS — E10622 Type 1 diabetes mellitus with other skin ulcer: Secondary | ICD-10-CM | POA: Diagnosis not present

## 2017-03-20 ENCOUNTER — Other Ambulatory Visit: Payer: Self-pay | Admitting: Internal Medicine

## 2017-03-21 NOTE — Progress Notes (Signed)
[name], [name] ([id number]) Visit Report for 03/19/2017 Arrival Information Details Patient Name: [name]. Date of Service: 03/19/2017 3:30 PM Medical Record Number: [id number] Patient Account Number: [id number] Date of Birth/Sex: 15-Feb-1971 (46 y.o. Male) Treating RN: [name] Gouty, RN, BSN, Velva Harman Primary Care Joyanne Eddinger: Einar Pheasant Other Clinician: Referring Christoher Drudge: Einar Pheasant Treating Dream Harman/Extender: Frann Rider in Treatment: 1 Visit Information History Since Last Visit All ordered tests and consults were completed: No Patient Arrived: Wheel Chair Added or deleted any medications: No Arrival Time: 15:28 Any new allergies or adverse reactions: No Accompanied By: self Had a fall or experienced change in No Transfer Assistance: EasyPivot activities of daily living that may affect Patient Lift risk of falls: Patient Identification Verified: Yes Signs or symptoms of abuse/neglect since last No Secondary Verification Process Yes visito Completed: Hospitalized since last visit: No Patient Requires Transmission- No Has Dressing in Place as Prescribed: Yes Based Precautions: Pain Present Now: No Patient Has Alerts: No Electronic Signature(s) Signed: 03/19/2017 4:33:09 PM By: Regan Lemming BSN, RN Entered By: Regan Lemming on 03/19/2017 15:28:51 [place], [name] ([id number]) -------------------------------------------------------------------------------- Clinic Level of Care Assessment Details Patient Name: [name] Girt. Date of Service: 03/19/2017 3:30 PM Medical Record Number: [id number] Patient Account Number: [id number] Date of Birth/Sex: [birth year] (46 y.o. Male) Treating RN: [name] Gouty, RN, BSN, [place] Primary Care Bartley Vuolo: Einar Pheasant Other Clinician: Referring Daneka Lantigua: Einar Pheasant Treating Lilybelle Mayeda/Extender: Frann Rider in Treatment: 1 Clinic Level of Care Assessment Items TOOL 4 Quantity Score []  - Use when only an EandM is performed on  FOLLOW-UP visit 0 ASSESSMENTS - Nursing Assessment / Reassessment X - Reassessment of Co-morbidities (includes updates in patient status) 1 10 X - Reassessment of Adherence to Treatment Plan 1 5 ASSESSMENTS - Wound and Skin Assessment / Reassessment []  - Simple Wound Assessment / Reassessment - one wound 0 X - Complex Wound Assessment / Reassessment - multiple wounds 2 5 []  - Dermatologic / Skin Assessment (not related to wound area) 0 ASSESSMENTS - Focused Assessment []  - Circumferential Edema Measurements - multi extremities 0 []  - Nutritional Assessment / Counseling / Intervention 0 []  - Lower Extremity Assessment (monofilament, tuning fork, pulses) 0 []  - Peripheral Arterial Disease Assessment (using hand held doppler) 0 ASSESSMENTS - Ostomy and/or Continence Assessment and Care []  - Incontinence Assessment and Management 0 []  - Ostomy Care Assessment and Management (repouching, etc.) 0 PROCESS - Coordination of Care X - Simple Patient / Family Education for ongoing care 1 15 []  - Complex (extensive) Patient / Family Education for ongoing care 0 []  - Staff obtains Programmer, systems, Records, Test Results / Process Orders 0 []  - Staff telephones HHA, Nursing Homes / Clarify orders / etc 0 []  - Routine Transfer to another Facility (non-emergent condition) 0 [name], [name] ([id number]) []  - Routine Hospital Admission (non-emergent condition) 0 []  - New Admissions / Biomedical engineer / Ordering NPWT, Apligraf, etc. 0 []  - Emergency Hospital Admission (emergent condition) 0 []  - Simple Discharge Coordination 0 []  - Complex (extensive) Discharge Coordination 0 PROCESS - Special Needs []  - Pediatric / Minor Patient Management 0 []  - Isolation Patient Management 0 []  - Hearing / Language / Visual special needs 0 []  - Assessment of Community assistance (transportation, D/C planning, etc.) 0 []  - Additional assistance / Altered mentation 0 []  - Support Surface(s) Assessment (bed, cushion,  seat, etc.) 0 INTERVENTIONS - Wound Cleansing / Measurement []  - Simple Wound Cleansing - one wound 0 X - Complex Wound Cleansing - multiple wounds  2 5 X - Wound Imaging (photographs - any number of wounds) 1 5 []  - Wound Tracing (instead of photographs) 0 []  - Simple Wound Measurement - one wound 0 X - Complex Wound Measurement - multiple wounds 2 5 INTERVENTIONS - Wound Dressings X - Small Wound Dressing one or multiple wounds 2 10 []  - Medium Wound Dressing one or multiple wounds 0 []  - Large Wound Dressing one or multiple wounds 0 []  - Application of Medications - topical 0 []  - Application of Medications - injection 0 INTERVENTIONS - Miscellaneous []  - External ear exam 0 Brunner, [name] ([id number]) []  - Specimen Collection (cultures, biopsies, blood, body fluids, etc.) 0 []  - Specimen(s) / Culture(s) sent or taken to Lab for analysis 0 []  - Patient Transfer (multiple staff / Harrel Lemon Lift / Similar devices) 0 []  - Simple Staple / Suture removal (25 or less) 0 []  - Complex Staple / Suture removal (26 or more) 0 []  - Hypo / Hyperglycemic Management (close monitor of Blood Glucose) 0 []  - Ankle / Brachial Index (ABI) - do not check if billed separately 0 X - Vital Signs 1 5 Has the patient been seen at the hospital within the last three years: Yes Total Score: 90 Level Of Care: New/Established - Level 3 Electronic Signature(s) Signed: 03/19/2017 4:33:09 PM By: Regan Lemming BSN, RN Entered By: Regan Lemming on 03/19/2017 16:00:18 [name], [name] ([id number]) -------------------------------------------------------------------------------- Encounter Discharge Information Details Patient Name: [name] Girt. Date of Service: 03/19/2017 3:30 PM Medical Record Number: [id number] Patient Account Number: [id number] Date of Birth/Sex: [birth year] (46 y.o. Male) Treating RN: [name] Gouty, RN, BSN, Velva Harman Primary Care Jasira Robinson: Einar Pheasant Other Clinician: Referring Iliyana Convey: Einar Pheasant Treating Nyeem Stoke/Extender: Frann Rider in Treatment: 1 Encounter Discharge Information Items Discharge Pain Level: 0 Discharge Condition: Stable Ambulatory Status: Wheelchair Discharge Destination: Home Transportation: Private Auto Accompanied By: self Schedule Follow-up Appointment: No Medication Reconciliation completed and provided to Patient/Care No Jahaan Vanwagner: Provided on Clinical Summary of Care: 03/19/2017 Form Type Recipient Paper Patient SR Electronic Signature(s) Signed: 03/19/2017 4:15:07 PM By: Regan Lemming BSN, RN Previous Signature: 03/19/2017 4:06:49 PM Version By: Ruthine Dose Entered By: Regan Lemming on 03/19/2017 16:15:06 [place], [name] ([id number]) -------------------------------------------------------------------------------- Lower Extremity Assessment Details Patient Name: [name] Girt. Date of Service: 03/19/2017 3:30 PM Medical Record Number: [id number] Patient Account Number: [id number] Date of Birth/Sex: May 15, 1971 (45 y.o. Male) Treating RN: [name] Gouty, RN, BSN, [place] Primary Care Araya Roel: Einar Pheasant Other Clinician: Referring Argus Caraher: Einar Pheasant Treating Laurene Melendrez/Extender: Frann Rider in Treatment: 1 Electronic Signature(s) Signed: 03/19/2017 4:33:09 PM By: Regan Lemming BSN, RN Entered By: Regan Lemming on 03/19/2017 15:40:07 Uddin, [name] ([id number]) -------------------------------------------------------------------------------- Multi Wound Chart Details Patient Name: [name] Girt. Date of Service: 03/19/2017 3:30 PM Medical Record Number: [id number] Patient Account Number: [id number] Date of Birth/Sex: 1971/09/21 (46 y.o. Male) Treating RN: [name] Gouty, RN, BSN, Velva Harman Primary Care Miryam Mcelhinney: Einar Pheasant Other Clinician: Referring Saburo Luger: Einar Pheasant Treating Amia Rynders/Extender: Frann Rider in Treatment: 1 Vital Signs Height(in): 72 Pulse(bpm): 68 Weight(lbs): 170 Blood  Pressure 157/112 (mmHg): Body Mass Index(BMI): 23 Temperature(F): 97.7 Respiratory Rate 18 (breaths/min): Photos: [4:No Photos] [5:No Photos] [N/A:N/A] Wound Location: [4:Sacrum - Medial] [5:Left Calcaneus] [N/A:N/A] Wounding Event: [4:Surgical Injury] [5:Pressure Injury] [N/A:N/A] Primary Etiology: [4:Open Surgical Wound] [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] Comorbid History: [4:Type I Diabetes, History of Type I Diabetes, History of N/A pressure wounds, Paraplegia] [5:pressure wounds, Paraplegia] Date Acquired: [4:10/21/2016] [5:03/15/2017] [N/A:N/A] Weeks of Treatment: [4:1] [5:0] [N/A:N/A]  Wound Status: [4:Open] [5:Open] [N/A:N/A] Measurements L x W x D 2x1.8x0.1 [5:0.8x1x0.1] [N/A:N/A] (cm) Area (cm) : [4:2.827] [5:0.628] [N/A:N/A] Volume (cm) : [4:0.283] [5:0.063] [N/A:N/A] % Reduction in Area: [4:52.00%] [5:N/A] [N/A:N/A] % Reduction in Volume: 76.00% [5:N/A] [N/A:N/A] Classification: [4:Full Thickness Without Exposed Support Structures] [5:Grade 1] [N/A:N/A] Exudate Amount: [4:Medium] [5:Medium] [N/A:N/A] Exudate Type: [4:Serosanguineous] [5:Serosanguineous] [N/A:N/A] Exudate Color: [4:red, brown] [5:red, brown] [N/A:N/A] Wound Margin: [4:Distinct, outline attached Distinct, outline attached N/A] Granulation Amount: [4:Large (67-100%)] [5:Large (67-100%)] [N/A:N/A] Granulation Quality: [4:Pink, Pale] [5:Pink, Pale] [N/A:N/A] Necrotic Amount: [4:None Present (0%)] [5:Small (1-33%)] [N/A:N/A] Exposed Structures: [4:Fat Layer (Subcutaneous Fat Layer (Subcutaneous N/A Tissue) Exposed: Yes Fascia: No] [5:Tissue) Exposed: Yes Fascia: No] Tendon: No Tendon: No Muscle: No Muscle: No Joint: No Joint: No Bone: No Bone: No Epithelialization: Small (1-33%) None N/A Periwound Skin Texture: Excoriation: No Excoriation: No N/A Induration: No Induration: No Callus: No Callus: No Crepitus: No Crepitus: No Rash: No Rash: No Scarring: No Scarring:  No Periwound Skin Maceration: No Maceration: No N/A Moisture: Dry/Scaly: No Dry/Scaly: No Periwound Skin Color: Atrophie Blanche: No Atrophie Blanche: No N/A Cyanosis: No Cyanosis: No Ecchymosis: No Ecchymosis: No Erythema: No Erythema: No Hemosiderin Staining: No Hemosiderin Staining: No Mottled: No Mottled: No Pallor: No Pallor: No Rubor: No Rubor: No Temperature: No Abnormality No Abnormality N/A Tenderness on No No N/A Palpation: Wound Preparation: Ulcer Cleansing: Ulcer Cleansing: N/A Rinsed/Irrigated with Rinsed/Irrigated with Saline Saline Topical Anesthetic Topical Anesthetic Applied: None Applied: Other: lidocaine 4% Treatment Notes Electronic Signature(s) Signed: 03/19/2017 4:03:15 PM By: Christin Fudge MD, FACS Entered By: Christin Fudge on 03/19/2017 16:03:15 Komperda, [name] ([id number]) -------------------------------------------------------------------------------- [place] Details Patient Name: [name] Girt. Date of Service: 03/19/2017 3:30 PM Medical Record Number: [id number] Patient Account Number: [id number] Date of Birth/Sex: [birth year] (46 y.o. Male) Treating RN: [name] Gouty, RN, BSN, Velva Harman Primary Care Kerrie Timm: Einar Pheasant Other Clinician: Referring Krystin Keeven: Einar Pheasant Treating Dixie Jafri/Extender: Frann Rider in Treatment: 1 Active Inactive ` Orientation to the Wound Care Program Nursing Diagnoses: Knowledge deficit related to the wound healing center program Goals: Patient/caregiver will verbalize understanding of the [facility] Program Date Initiated: 03/12/2017 Target Resolution Date: 07/12/2017 Goal Status: Active Interventions: Provide education on orientation to the wound center Notes: ` Wound/Skin Impairment Nursing Diagnoses: Impaired tissue integrity Knowledge deficit related to ulceration/compromised skin integrity Goals: Patient/caregiver will verbalize understanding of skin care  regimen Date Initiated: 03/12/2017 Target Resolution Date: 07/12/2017 Goal Status: Active Ulcer/skin breakdown will have a volume reduction of 30% by week 4 Date Initiated: 03/12/2017 Target Resolution Date: 07/12/2017 Goal Status: Active Ulcer/skin breakdown will have a volume reduction of 50% by week 8 Date Initiated: 03/12/2017 Target Resolution Date: 07/12/2017 Goal Status: Active Ulcer/skin breakdown will have a volume reduction of 80% by week 12 Date Initiated: 03/12/2017 Target Resolution Date: 07/12/2017 Goal Status: Active Ulcer/skin breakdown will heal within 14 weeks DONDRE, CATALFAMO ([id number]) Date Initiated: 03/12/2017 Target Resolution Date: 07/12/2017 Goal Status: Active Interventions: Assess patient/caregiver ability to obtain necessary supplies Assess patient/caregiver ability to perform ulcer/skin care regimen upon admission and as needed Assess ulceration(s) every visit Provide education on ulcer and skin care Treatment Activities: Referred to DME Denia Mcvicar for dressing supplies : 03/12/2017 Skin care regimen initiated : 03/12/2017 Topical wound management initiated : 03/12/2017 Notes: Electronic Signature(s) Signed: 03/19/2017 4:33:09 PM By: Regan Lemming BSN, RN Entered By: Regan Lemming on 03/19/2017 15:40:14 [place], [name] ([id number]) -------------------------------------------------------------------------------- Pain Assessment Details Patient Name: [name] Girt. Date of Service: 03/19/2017 3:30  PM Medical Record Number: [id number] Patient Account Number: [id number] Date of Birth/Sex: 17-Nov-1971 (46 y.o. Male) Treating RN: [name] Gouty, RN, BSN, Velva Harman Primary Care Laurenashley Viar: Einar Pheasant Other Clinician: Referring Aquinnah Devin: Einar Pheasant Treating Solon Alban/Extender: Frann Rider in Treatment: 1 Active Problems Location of Pain Severity and Description of Pain Patient Has Paino Yes Site Locations Pain Location: Pain in Ulcers Rate the pain. Current Pain Level:  3 Character of Pain Describe the Pain: Tender Pain Management and Medication Current Pain Management: Electronic Signature(s) Signed: 03/19/2017 4:33:09 PM By: Regan Lemming BSN, RN Entered By: Regan Lemming on 03/19/2017 15:29:02 [place], [name] ([id number]) -------------------------------------------------------------------------------- Patient/Caregiver Education Details Patient Name: [name] Girt. Date of Service: 03/19/2017 3:30 PM Medical Record Number: [id number] Patient Account Number: [id number] Date of Birth/Gender: 08/20/1971 (46 y.o. Male) Treating RN: [name] Gouty, RN, BSN, Velva Harman Primary Care Physician: Einar Pheasant Other Clinician: Referring Physician: Einar Pheasant Treating Physician/Extender: Frann Rider in Treatment: 1 Education Assessment Education Provided To: Patient Education Topics Provided Welcome To The [facility]: Methods: Explain/Verbal Responses: State content correctly Wound/Skin Impairment: Methods: Explain/Verbal Responses: State content correctly Electronic Signature(s) Signed: 03/19/2017 4:33:09 PM By: Regan Lemming BSN, RN Entered By: Regan Lemming on 03/19/2017 16:15:20 [place]rer, [name] ([id number]) -------------------------------------------------------------------------------- Wound Assessment Details Patient Name: [name] Girt. Date of Service: 03/19/2017 3:30 PM Medical Record Number: [id number] Patient Account Number: [id number] Date of Birth/Sex: 05/06/1971 (46 y.o. Male) Treating RN: [name] Gouty, RN, BSN, [place] Primary Care Gertrude Tarbet: Einar Pheasant Other Clinician: Referring Lynnmarie Lovett: Einar Pheasant Treating Myrick Mcnairy/Extender: Frann Rider in Treatment: 1 Wound Status Wound Number: 4 Primary Open Surgical Wound Etiology: Wound Location: Sacrum - Medial Wound Open Wounding Event: Surgical Injury Status: Date Acquired: 10/21/2016 Comorbid Type I Diabetes, History of pressure Weeks Of Treatment: 1 History: wounds,  Paraplegia Clustered Wound: No Photos Photo Uploaded By: Regan Lemming on 03/19/2017 16:32:56 Wound Measurements Length: (cm) 2 Width: (cm) 1.8 Depth: (cm) 0.1 Area: (cm) 2.827 Volume: (cm) 0.283 % Reduction in Area: 52% % Reduction in Volume: 76% Epithelialization: Small (1-33%) Tunneling: No Undermining: No Wound Description Full Thickness Without Exposed Classification: Support Structures Wound Margin: Distinct, outline attached Exudate Medium Amount: Exudate Type: Serosanguineous Exudate Color: red, brown Foul Odor After Cleansing: No Slough/Fibrino No Wound Bed Granulation Amount: Large (67-100%) Exposed Structure Granulation Quality: Pink, Pale Fascia Exposed: No Necrotic Amount: None Present (0%) Fat Layer (Subcutaneous Tissue) Exposed: Yes Kagel, [name] ([id number]) Tendon Exposed: No Muscle Exposed: No Joint Exposed: No Bone Exposed: No Periwound Skin Texture Texture Color No Abnormalities Noted: No No Abnormalities Noted: No Callus: No Atrophie Blanche: No Crepitus: No Cyanosis: No Excoriation: No Ecchymosis: No Induration: No Erythema: No Rash: No Hemosiderin Staining: No Scarring: No Mottled: No Pallor: No Moisture Rubor: No No Abnormalities Noted: No Dry / Scaly: No Temperature / Pain Maceration: No Temperature: No Abnormality Wound Preparation Ulcer Cleansing: Rinsed/Irrigated with Saline Topical Anesthetic Applied: None Treatment Notes Wound #4 (Medial Sacrum) 1. Cleansed with: Clean wound with Normal Saline 4. Dressing Applied: Hydrafera Blue Prisma Ag 5. Secondary Dressing Applied Bordered Foam Dressing Dry Gauze Electronic Signature(s) Signed: 03/19/2017 4:33:09 PM By: Regan Lemming BSN, RN Entered By: Regan Lemming on 03/19/2017 15:39:59 Rogel, [name] ([id number]) -------------------------------------------------------------------------------- Wound Assessment Details Patient Name: [name] Girt. Date of Service:  03/19/2017 3:30 PM Medical Record Number: [id number] Patient Account Number: [id number] Date of Birth/Sex: [birth year] (46 y.o. Male) Treating RN: [name] Gouty, RN, BSN, Velva Harman Primary Care Ellianna Ruest: Einar Pheasant Other Clinician: Referring Glenice Ciccone: Einar Pheasant Treating Elyse Prevo/Extender: Con Memos  Errol Weeks in Treatment: 1 Wound Status Wound Number: 5 Primary Diabetic Wound/Ulcer of the Lower Etiology: Extremity Wound Location: Left Calcaneus Wound Open Wounding Event: Pressure Injury Status: Date Acquired: 03/15/2017 Comorbid Type I Diabetes, History of pressure Weeks Of Treatment: 0 History: wounds, Paraplegia Clustered Wound: No Photos Photo Uploaded By: Regan Lemming on 03/19/2017 16:32:57 Wound Measurements Length: (cm) 0.8 Width: (cm) 1 Depth: (cm) 0.1 Area: (cm) 0.628 Volume: (cm) 0.063 % Reduction in Area: % Reduction in Volume: Epithelialization: None Tunneling: No Undermining: No Wound Description Classification: Grade 1 Wound Margin: Distinct, outline attached Exudate Amount: Medium Exudate Type: Serosanguineous Exudate Color: red, brown Ignatowski, [name] ([id number]) Foul Odor After Cleansing: No Slough/Fibrino No Wound Bed Granulation Amount: Large (67-100%) Exposed Structure Granulation Quality: Pink, Pale Fascia Exposed: No Necrotic Amount: Small (1-33%) Fat Layer (Subcutaneous Tissue) Exposed: Yes Necrotic Quality: Adherent Slough Tendon Exposed: No Muscle Exposed: No Joint Exposed: No Bone Exposed: No Periwound Skin Texture Texture Color No Abnormalities Noted: No No Abnormalities Noted: No Callus: No Atrophie Blanche: No Crepitus: No Cyanosis: No Excoriation: No Ecchymosis: No Induration: No Erythema: No Rash: No Hemosiderin Staining: No Scarring: No Mottled: No Pallor: No Moisture Rubor: No No Abnormalities Noted: No Dry / Scaly: No Temperature / Pain Maceration: No Temperature: No Abnormality Wound Preparation Ulcer Cleansing:  Rinsed/Irrigated with Saline Topical Anesthetic Applied: Other: lidocaine 4%, Treatment Notes Wound #5 (Left Calcaneus) 1. Cleansed with: Clean wound with Normal Saline 4. Dressing Applied: Hydrafera Blue Prisma Ag 5. Secondary Dressing Applied Bordered Foam Dressing Dry Gauze Electronic Signature(s) Signed: 03/19/2017 3:55:42 PM By: Regan Lemming BSN, RN Entered By: Regan Lemming on 03/19/2017 15:55:42 Balzarini, [name] ([id number]) -------------------------------------------------------------------------------- Vitals Details Patient Name: [name] Girt. Date of Service: 03/19/2017 3:30 PM Medical Record Number: [id number] Patient Account Number: [id number] Date of Birth/Sex: July 16, 1971 (46 y.o. Male) Treating RN: Afful, RN, BSN, [place] Primary Care Dewanda Fennema: Einar Pheasant Other Clinician: Referring Yomira Flitton: Einar Pheasant Treating Kelse Ploch/Extender: Frann Rider in Treatment: 1 Vital Signs Time Taken: 15:29 Temperature (F): 97.7 Height (in): 72 Pulse (bpm): 68 Weight (lbs): 170 Respiratory Rate (breaths/min): 18 Body Mass Index (BMI): 23.1 Blood Pressure (mmHg): 157/112 Reference Range: 80 - 120 mg / dl Electronic Signature(s) Signed: 03/19/2017 4:33:09 PM By: Regan Lemming BSN, RN Entered By: Regan Lemming on 03/19/2017 15:29:36
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
## 2017-03-22 ENCOUNTER — Encounter: Payer: Self-pay | Admitting: *Deleted

## 2017-03-22 ENCOUNTER — Telehealth: Payer: Self-pay | Admitting: *Deleted

## 2017-03-22 NOTE — Telephone Encounter (Signed)
Per Dr. Bary Castilla, it is ok to cancel appointment that was scheduled for tomorrow and follow up in one month.   Message left on cell phone with this information.

## 2017-03-22 NOTE — Telephone Encounter (Signed)
Patient called the office to report that he has an appointment tomorrow afternoon, 03-23-17. He states that he does not think he needs appointment at this time since he is being seen at the wound clinic. Patient states things are bandaged up and that you won't be able to see the area. He is requesting to wait a month to allow things to heal.   Please call patient on cell or send a My Chart message to let him know what you would like him to do.

## 2017-03-23 ENCOUNTER — Ambulatory Visit: Payer: 59 | Admitting: General Surgery

## 2017-03-29 ENCOUNTER — Encounter: Payer: Medicare Other | Admitting: Surgery

## 2017-03-29 DIAGNOSIS — E10622 Type 1 diabetes mellitus with other skin ulcer: Secondary | ICD-10-CM | POA: Diagnosis not present
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
## 2017-03-30 NOTE — Progress Notes (Signed)
[name], [name] ([id number]) Visit Report for 03/29/2017 Arrival Information Details Patient Name: [name], [name]. Date of Service: 03/29/2017 3:00 PM Medical Record Number: [id number] Patient Account Number: [id number] Date of Birth/Sex: 1971-04-01 (46 y.o. Male) Treating RN: Cornell Barman Primary Care Adalai Perl: Einar Pheasant Other Clinician: Referring Asir Bingley: Einar Pheasant Treating Mckinzi Eriksen/Extender: Frann Rider in Treatment: 2 Visit Information History Since Last Visit Added or deleted any medications: No Patient Arrived: Wheel Chair Any new allergies or adverse reactions: No Arrival Time: 15:00 Had a fall or experienced change in No activities of daily living that may affect Accompanied By: self risk of falls: Transfer Assistance: None Signs or symptoms of abuse/neglect since last No Patient Identification Verified: Yes visito Secondary Verification Process Yes Hospitalized since last visit: No Completed: Has Dressing in Place as Prescribed: Yes Patient Requires Transmission-Based No Pain Present Now: No Precautions: Patient Has Alerts: No Electronic Signature(s) Signed: 03/29/2017 4:28:31 PM By: Gretta Cool, RN, BSN, Kim RN, BSN Entered By: Gretta Cool, RN, BSN, Kim on 03/29/2017 15:01:24 [place], [name] ([id number]) -------------------------------------------------------------------------------- Clinic Level of Care Assessment Details Patient Name: [name]. Date of Service: 03/29/2017 3:00 PM Medical Record Number: [id number] Patient Account Number: [id number] Date of Birth/Sex: March 22, 1971 (46 y.o. Male) Treating RN: Cornell Barman Primary Care Shira Bobst: Einar Pheasant Other Clinician: Referring Jahnyla Parrillo: Einar Pheasant Treating Alyha Marines/Extender: Frann Rider in Treatment: 2 Clinic Level of Care Assessment Items TOOL 4 Quantity Score []  - Use when only an EandM is performed on FOLLOW-UP visit 0 ASSESSMENTS - Nursing Assessment / Reassessment []  -  Reassessment of Co-morbidities (includes updates in patient status) 0 X - Reassessment of Adherence to Treatment Plan 1 5 ASSESSMENTS - Wound and Skin Assessment / Reassessment []  - Simple Wound Assessment / Reassessment - one wound 0 X - Complex Wound Assessment / Reassessment - multiple wounds 2 5 []  - Dermatologic / Skin Assessment (not related to wound area) 0 ASSESSMENTS - Focused Assessment []  - Circumferential Edema Measurements - multi extremities 0 []  - Nutritional Assessment / Counseling / Intervention 0 []  - Lower Extremity Assessment (monofilament, tuning fork, pulses) 0 []  - Peripheral Arterial Disease Assessment (using hand held doppler) 0 ASSESSMENTS - Ostomy and/or Continence Assessment and Care []  - Incontinence Assessment and Management 0 []  - Ostomy Care Assessment and Management (repouching, etc.) 0 PROCESS - Coordination of Care X - Simple Patient / Family Education for ongoing care 1 15 []  - Complex (extensive) Patient / Family Education for ongoing care 0 []  - Staff obtains Programmer, systems, Records, Test Results / Process Orders 0 []  - Staff telephones HHA, Nursing Homes / Clarify orders / etc 0 []  - Routine Transfer to another Facility (non-emergent condition) 0 Niblack, [name] ([id number]) []  - Routine Hospital Admission (non-emergent condition) 0 []  - New Admissions / Biomedical engineer / Ordering NPWT, Apligraf, etc. 0 []  - Emergency Hospital Admission (emergent condition) 0 X - Simple Discharge Coordination 1 10 []  - Complex (extensive) Discharge Coordination 0 PROCESS - Special Needs []  - Pediatric / Minor Patient Management 0 []  - Isolation Patient Management 0 []  - Hearing / Language / Visual special needs 0 []  - Assessment of Community assistance (transportation, D/C planning, etc.) 0 []  - Additional assistance / Altered mentation 0 []  - Support Surface(s) Assessment (bed, cushion, seat, etc.) 0 INTERVENTIONS - Wound Cleansing / Measurement []  - Simple  Wound Cleansing - one wound 0 []  - Complex Wound Cleansing - multiple wounds 0 []  - Wound Imaging (photographs - any number of wounds)  0 []  - Wound Tracing (instead of photographs) 0 []  - Simple Wound Measurement - one wound 0 []  - Complex Wound Measurement - multiple wounds 0 INTERVENTIONS - Wound Dressings []  - Small Wound Dressing one or multiple wounds 0 X - Medium Wound Dressing one or multiple wounds 2 15 []  - Large Wound Dressing one or multiple wounds 0 []  - Application of Medications - topical 0 []  - Application of Medications - injection 0 INTERVENTIONS - Miscellaneous []  - External ear exam 0 Gutkowski, [name] ([id number]) []  - Specimen Collection (cultures, biopsies, blood, body fluids, etc.) 0 []  - Specimen(s) / Culture(s) sent or taken to Lab for analysis 0 []  - Patient Transfer (multiple staff / Harrel Lemon Lift / Similar devices) 0 []  - Simple Staple / Suture removal (25 or less) 0 []  - Complex Staple / Suture removal (26 or more) 0 []  - Hypo / Hyperglycemic Management (close monitor of Blood Glucose) 0 []  - Ankle / Brachial Index (ABI) - do not check if billed separately 0 X - Vital Signs 1 5 Has the patient been seen at the hospital within the last three years: Yes Total Score: 75 Level Of Care: New/Established - Level 2 Electronic Signature(s) Signed: 03/29/2017 4:28:31 PM By: Gretta Cool, RN, BSN, Kim RN, BSN Entered By: Gretta Cool, RN, BSN, Kim on 03/29/2017 15:18:22 [place], [name] ([id number]) -------------------------------------------------------------------------------- Lower Extremity Assessment Details Patient Name: [name]. Date of Service: 03/29/2017 3:00 PM Medical Record Number: [id number] Patient Account Number: [id number] Date of Birth/Sex: 1971/03/11 (46 y.o. Male) Treating RN: Cornell Barman Primary Care Tajuana Kniskern: Einar Pheasant Other Clinician: Referring Oluwatomisin Deman: Einar Pheasant Treating Axel Frisk/Extender: Frann Rider in Treatment: 2 Vascular  Assessment Pulses: Dorsalis Pedis Palpable: [Left:Yes] Posterior Tibial Palpable: [Left:Yes] Extremity colors, hair growth, and conditions: Extremity Color: [Left:Normal] Hair Growth on Extremity: [Left:No] Temperature of Extremity: [Left:Cool] Capillary Refill: [Left:< 3 seconds] Toe Nail Assessment Left: Right: Thick: No Discolored: No Deformed: No Improper Length and Hygiene: No Electronic Signature(s) Signed: 03/29/2017 4:28:31 PM By: Gretta Cool, RN, BSN, Kim RN, BSN Entered By: Gretta Cool, RN, BSN, Kim on 03/29/2017 15:11:09 [place], [name] ([id number]) -------------------------------------------------------------------------------- Multi Wound Chart Details Patient Name: [name]. Date of Service: 03/29/2017 3:00 PM Medical Record Number: [id number] Patient Account Number: [id number] Date of Birth/Sex: [birth year] (46 y.o. Male) Treating RN: Cornell Barman Primary Care Ziyah Cordoba: Einar Pheasant Other Clinician: Referring Terald Jump: Einar Pheasant Treating Jamieka Royle/Extender: Frann Rider in Treatment: 2 Vital Signs Height(in): 72 Pulse(bpm): 62 Weight(lbs): 170 Blood Pressure 151/86 (mmHg): Body Mass Index(BMI): 23 Temperature(F): 97.6 Respiratory Rate 16 (breaths/min): Wound Assessments Treatment Notes Electronic Signature(s) Signed: 03/29/2017 3:26:52 PM By: Christin Fudge MD, FACS Entered By: Christin Fudge on 03/29/2017 15:26:52 [place], [name] ([id number]) -------------------------------------------------------------------------------- [place] Details Patient Name: [name]. Date of Service: 03/29/2017 3:00 PM Medical Record Number: [id number] Patient Account Number: [id number] Date of Birth/Sex: [birth year] (46 y.o. Male) Treating RN: Cornell Barman Primary Care Aneri Slagel: Einar Pheasant Other Clinician: Referring Arwin Bisceglia: Einar Pheasant Treating Gaylin Bulthuis/Extender: Frann Rider in Treatment: 2 Active  Inactive ` Orientation to the Wound Care Program Nursing Diagnoses: Knowledge deficit related to the wound healing center program Goals: Patient/caregiver will verbalize understanding of the [place] Program Date Initiated: 03/12/2017 Target Resolution Date: 07/12/2017 Goal Status: Active Interventions: Provide education on orientation to the wound center Notes: ` Wound/Skin Impairment Nursing Diagnoses: Impaired tissue integrity Knowledge deficit related to ulceration/compromised skin integrity Goals: Patient/caregiver will verbalize understanding of skin care regimen Date Initiated: 03/12/2017 Target  Resolution Date: 07/12/2017 Goal Status: Active Ulcer/skin breakdown will have a volume reduction of 30% by week 4 Date Initiated: 03/12/2017 Target Resolution Date: 07/12/2017 Goal Status: Active Ulcer/skin breakdown will have a volume reduction of 50% by week 8 Date Initiated: 03/12/2017 Target Resolution Date: 07/12/2017 Goal Status: Active Ulcer/skin breakdown will have a volume reduction of 80% by week 12 Date Initiated: 03/12/2017 Target Resolution Date: 07/12/2017 Goal Status: Active Ulcer/skin breakdown will heal within 14 weeks DAVYON, FISCH ([id number]) Date Initiated: 03/12/2017 Target Resolution Date: 07/12/2017 Goal Status: Active Interventions: Assess patient/caregiver ability to obtain necessary supplies Assess patient/caregiver ability to perform ulcer/skin care regimen upon admission and as needed Assess ulceration(s) every visit Provide education on ulcer and skin care Treatment Activities: Referred to DME Darsi Tien for dressing supplies : 03/12/2017 Skin care regimen initiated : 03/12/2017 Topical wound management initiated : 03/12/2017 Notes: Electronic Signature(s) Signed: 03/29/2017 4:28:31 PM By: Gretta Cool, RN, BSN, Kim RN, BSN Entered By: Gretta Cool, RN, BSN, Kim on 03/29/2017 15:11:19 [place], [name]  ([id number]) -------------------------------------------------------------------------------- Pain Assessment Details Patient Name: [name]. Date of Service: 03/29/2017 3:00 PM Medical Record Number: [id number] Patient Account Number: [id number] Date of Birth/Sex: 11/28/1971 (46 y.o. Male) Treating RN: Cornell Barman Primary Care Stefanie Hodgens: Einar Pheasant Other Clinician: Referring Loi Rennaker: Einar Pheasant Treating Karine Garn/Extender: Frann Rider in Treatment: 2 Active Problems Location of Pain Severity and Description of Pain Patient Has Paino No Site Locations With Dressing Change: No Pain Management and Medication Current Pain Management: Electronic Signature(s) Signed: 03/29/2017 4:28:31 PM By: Gretta Cool, RN, BSN, Kim RN, BSN Entered By: Gretta Cool, RN, BSN, Kim on 03/29/2017 15:01:32 [place], [name] ([id number]) -------------------------------------------------------------------------------- Vitals Details Patient Name: [name]. Date of Service: 03/29/2017 3:00 PM Medical Record Number: [id number] Patient Account Number: [id number] Date of Birth/Sex: 12/06/1971 (46 y.o. Male) Treating RN: Cornell Barman Primary Care Troyce Gieske: Einar Pheasant Other Clinician: Referring Mak Bonny: Einar Pheasant Treating Keyaan Lederman/Extender: Frann Rider in Treatment: 2 Vital Signs Time Taken: 15:01 Temperature (F): 97.6 Height (in): 72 Pulse (bpm): 62 Weight (lbs): 170 Respiratory Rate (breaths/min): 16 Body Mass Index (BMI): 23.1 Blood Pressure (mmHg): 151/86 Reference Range: 80 - 120 mg / dl Electronic Signature(s) Signed: 03/29/2017 4:28:31 PM By: Gretta Cool, RN, BSN, Kim RN, BSN Entered By: Gretta Cool, RN, BSN, Kim on 03/29/2017 15:02:12

## 2017-04-05 ENCOUNTER — Ambulatory Visit: Payer: 59 | Admitting: Surgery

## 2017-04-05 ENCOUNTER — Encounter: Payer: Medicare Other | Admitting: Surgery

## 2017-04-05 DIAGNOSIS — E10622 Type 1 diabetes mellitus with other skin ulcer: Secondary | ICD-10-CM | POA: Diagnosis not present

## 2017-04-06 NOTE — Progress Notes (Signed)
KIRILL, CHATTERJEE ([id number]) Visit Report for 04/05/2017 Arrival Information Details Patient Name: [name], [name]. Date of Service: 04/05/2017 3:00 PM Medical Record Number: [id number] Patient Account Number: [id number] Date of Birth/Sex: 1971-03-05 (46 y.o. Male) Treating RN: Cornell Barman Primary Care Tyshae Stair: Einar Pheasant Other Clinician: Referring Jatorian Renault: Einar Pheasant Treating Reta Norgren/Extender: Frann Rider in Treatment: 3 Visit Information History Since Last Visit Added or deleted any medications: No Patient Arrived: Wheel Chair Any new allergies or adverse reactions: No Arrival Time: 15:02 Had a fall or experienced change in No activities of daily living that may affect Accompanied By: self risk of falls: Transfer Assistance: None Signs or symptoms of abuse/neglect since last No Patient Identification Verified: Yes visito Secondary Verification Process Yes Hospitalized since last visit: No Completed: Has Dressing in Place as Prescribed: Yes Patient Requires Transmission-Based No Pain Present Now: No Precautions: Patient Has Alerts: No Electronic Signature(s) Signed: 04/05/2017 5:08:01 PM By: Gretta Cool, RN, BSN, Kim RN, BSN Entered By: Gretta Cool, RN, BSN, Kim on 04/05/2017 15:02:40 [place], [name] ([id number]) -------------------------------------------------------------------------------- Lower Extremity Assessment Details Patient Name: [name]. Date of Service: 04/05/2017 3:00 PM Medical Record Number: [id number] Patient Account Number: [id number] Date of Birth/Sex: Oct 03, 1971 (46 y.o. Male) Treating RN: Cornell Barman Primary Care Kawan Valladolid: Einar Pheasant Other Clinician: Referring Kallan Bischoff: Einar Pheasant Treating Adolfo Granieri/Extender: Frann Rider in Treatment: 3 Edema Assessment Assessed: [Left: No] [Right: No] Edema: [Left: N] [Right: o] Vascular Assessment Pulses: Dorsalis Pedis Palpable: [Left:Yes] Posterior Tibial Extremity colors, hair  growth, and conditions: Extremity Color: [Left:Normal] Hair Growth on Extremity: [Left:Yes] Temperature of Extremity: [Left:Warm] Capillary Refill: [Left:< 3 seconds] Dependent Rubor: [Left:No] Blanched when Elevated: [Left:No] Lipodermatosclerosis: [Left:No] Toe Nail Assessment Left: Right: Thick: No Discolored: No Deformed: No Improper Length and Hygiene: No Electronic Signature(s) Signed: 04/05/2017 5:08:01 PM By: Gretta Cool, RN, BSN, Kim RN, BSN Entered By: Gretta Cool, RN, BSN, Kim on 04/05/2017 15:13:55 [place], [name] ([id number]) -------------------------------------------------------------------------------- Multi Wound Chart Details Patient Name: [name]. Date of Service: 04/05/2017 3:00 PM Medical Record Number: [id number] Patient Account Number: [id number] Date of Birth/Sex: August 16, 1971 (46 y.o. 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] Comorbid History: [4:Type I Diabetes, History of Type I Diabetes, History of N/A pressure wounds, Paraplegia] [5:pressure wounds, Paraplegia] Date Acquired: [4:10/21/2016] [5:03/15/2017] [N/A:N/A] Weeks of Treatment: [4:3] [5:2] [N/A:N/A] Wound Status: [4:Open] [5:Open] [N/A:N/A] Measurements L x W x D 1.5x2x0.1 [5:1.4x0.3x0.3] [N/A:N/A] (cm) Area (cm) : [4:2.356] [5:0.33] [N/A:N/A] Volume (cm) : [4:0.236] [5:0.099] [N/A:N/A] % Reduction in Area: [4:60.00%]  [5:47.50%] [N/A:N/A] % Reduction in Volume: 80.00% [5:-57.10%] [N/A:N/A] Classification: [4:Full Thickness Without Exposed Support Structures] [5:Grade 1] [N/A:N/A] Exudate Amount: [4:Medium] [5:Medium] [N/A:N/A] Exudate Type: [4:Serosanguineous] [5:Serosanguineous] [N/A:N/A] Exudate Color: [4:red, brown] [5:red, brown] [N/A:N/A] Wound Margin: [4:Distinct, outline attached Distinct, outline attached N/A] Granulation Amount: [4:Large (67-100%)] [5:Large (67-100%)] [N/A:N/A] Granulation Quality: [4:Pink, Pale] [5:Pink, Pale] [N/A:N/A] Necrotic Amount: [4:None Present (0%)] [5:Small (1-33%)] [N/A:N/A] Exposed Structures: [4:Fat Layer (Subcutaneous Fat Layer (Subcutaneous N/A Tissue) Exposed: Yes Fascia: No] [5:Tissue) Exposed: Yes Fascia: No] Tendon: No Tendon: No Muscle: No Muscle: No Joint: No Joint: No Bone: No  Bone: No Epithelialization: Small (1-33%) None N/A Debridement: N/A Debridement ([id number]) Pre-procedure N/A 15:19 N/A Verification/Time Out Taken: Pain Control: N/A Other N/A Tissue Debrided: N/A Subcutaneous N/A Level: N/A Skin/Subcutaneous N/A Tissue Debridement Area (sq N/A 0.42 N/A cm): Instrument: N/A Curette N/A Bleeding: N/A Minimum N/A Hemostasis Achieved: N/A Pressure N/A Procedural Pain: N/A 1 N/A Post Procedural Pain: N/A 1 N/A Debridement Treatment N/A Procedure was tolerated N/A Response: well Post Debridement N/A 1.4x0.3x0.3 N/A Measurements L x W x D (cm) Post Debridement N/A 0.099 N/A Volume: (cm) Periwound Skin Texture: Excoriation: No Excoriation: No N/A Induration: No Induration: No Callus: No Callus: No Crepitus: No Crepitus: No Rash: No Rash: No Scarring: No Scarring: No Periwound Skin Maceration: No Maceration: No N/A Moisture: Dry/Scaly: No Dry/Scaly: No Periwound Skin Color: Atrophie Blanche: No Atrophie Blanche: No N/A Cyanosis: No Cyanosis: No Ecchymosis: No Ecchymosis: No Erythema: No Erythema:  No Hemosiderin Staining: No Hemosiderin Staining: No Mottled: No Mottled: No Pallor: No Pallor: No Rubor: No Rubor: No Temperature: No Abnormality No Abnormality N/A Tenderness on No No N/A Palpation: Wound Preparation: Ulcer Cleansing: Ulcer Cleansing: N/A Rinsed/Irrigated with Rinsed/Irrigated with Saline Saline Frenette, [name] ([id number]) Topical Anesthetic Topical Anesthetic Applied: None Applied: Other: lidocaine 4% Procedures Performed: N/A Debridement N/A Treatment Notes Electronic Signature(s) Signed: 04/05/2017 3:37:54 PM By: Christin Fudge MD, FACS Entered By: Christin Fudge on 04/05/2017 15:37:54 [place], [name] ([id number]) -------------------------------------------------------------------------------- [place] Details Patient Name: [name]. Date of Service: 04/05/2017 3:00 PM Medical Record Number: [id number] Patient Account Number: [id number] Date of Birth/Sex: 1971-11-10 (46 y.o. Male) Treating RN: Cornell Barman Primary Care Karime Scheuermann: Einar Pheasant Other Clinician: Referring Unita Detamore: Einar Pheasant Treating Chantil Bari/Extender: Frann Rider in Treatment: 3 Active Inactive ` Orientation to the Wound Care Program Nursing Diagnoses: Knowledge deficit related to the wound healing center program Goals: Patient/caregiver will verbalize understanding of the [place] Program Date Initiated: 03/12/2017 Target Resolution Date: 07/12/2017 Goal Status: Active Interventions: Provide education on orientation to the wound center Notes: ` Wound/Skin Impairment Nursing Diagnoses: Impaired tissue integrity Knowledge deficit related to ulceration/compromised skin integrity Goals: Patient/caregiver will verbalize understanding of skin care regimen Date Initiated: 03/12/2017 Target Resolution Date: 07/12/2017 Goal Status: Active Ulcer/skin breakdown will have a volume reduction of 30% by week 4 Date Initiated:  03/12/2017 Target Resolution Date: 07/12/2017 Goal Status: Active Ulcer/skin breakdown will have a volume reduction of 50% by week 8 Date Initiated: 03/12/2017 Target Resolution Date: 07/12/2017 Goal Status: Active Ulcer/skin breakdown will have a volume reduction of 80% by week 12 Date Initiated: 03/12/2017 Target Resolution Date: 07/12/2017 Goal Status: Active Ulcer/skin breakdown will heal within 14 weeks GARVIS, DOWNUM ([id number]) Date Initiated: 03/12/2017 Target Resolution Date: 07/12/2017 Goal Status: Active Interventions: Assess patient/caregiver ability to obtain necessary supplies Assess patient/caregiver ability to perform ulcer/skin care regimen upon admission and as needed Assess ulceration(s) every visit Provide education on ulcer and skin care Treatment Activities: Referred to DME Shakeria Robinette for dressing supplies : 03/12/2017 Skin care regimen initiated : 03/12/2017 Topical wound management initiated : 03/12/2017 Notes: Electronic Signature(s) Signed: 04/05/2017 5:08:01 PM By: Gretta Cool, RN, BSN, Kim RN, BSN Entered By: Gretta Cool, RN, BSN, Kim on 04/05/2017 15:14:08 [place], [name] ([id number]) -------------------------------------------------------------------------------- Pain Assessment Details Patient Name: [name]. Date of Service: 04/05/2017 3:00 PM Medical Record Number: [id number] Patient Account Number: [id number] Date of Birth/Sex: 07-18-1971 (46 y.o. Male) Treating RN: Cornell Barman Primary Care Truc Winfree: Einar Pheasant Other Clinician: Referring Krislyn Donnan: Einar Pheasant Treating Sibel Khurana/Extender: Con Memos  Errol Weeks in Treatment: 3 Active Problems Location of Pain Severity and Description of Pain Patient Has Paino No Site Locations With Dressing Change: No Pain Management and Medication Current Pain Management: Electronic Signature(s) Signed: 04/05/2017 5:08:01 PM By: Gretta Cool, RN, BSN, Kim RN, BSN Entered By: Gretta Cool, RN, BSN, Kim on 04/05/2017 15:02:50 [place], [name] ([id number]) -------------------------------------------------------------------------------- Wound Assessment Details Patient Name: [name]. Date of Service: 04/05/2017 3:00 PM Medical Record Number: [id number] Patient Account Number: [id number] Date of Birth/Sex: 1971/04/03 (46 y.o. Male) Treating RN: Cornell Barman Primary Care Janeal Abadi: Einar Pheasant Other Clinician: Referring Mekayla Soman: Einar Pheasant Treating Jonique Kulig/Extender: Frann Rider in Treatment: 3 Wound Status Wound Number: 4 Primary Open Surgical Wound Etiology: Wound Location: Sacrum - Medial Wound Open Wounding Event: Surgical Injury Status: Date Acquired: 10/21/2016 Comorbid Type I Diabetes, History of pressure Weeks Of Treatment: 3 History: wounds, Paraplegia Clustered Wound: No Photos Photo Uploaded By: Gretta Cool, RN, BSN, Kim on 04/05/2017 17:04:28 Wound Measurements Length: (cm) 1.5 Width: (cm) 2 Depth: (cm) 0.1 Area: (cm) 2.356 Volume: (cm) 0.236 % Reduction in Area: 60% % Reduction in Volume: 80% Epithelialization: Small (1-33%) Tunneling: No Undermining: No Wound Description Full Thickness Without Exposed Foul Odor After Classification: Support Structures Slough/Fibrino Wound Margin: Distinct, outline attached Exudate Medium Amount: Exudate Type: Serosanguineous Exudate Color: red, brown Cleansing: No No Wound Bed Granulation Amount: Large (67-100%) Exposed Structure Granulation Quality: Pink, Pale Fascia Exposed: No Necrotic Amount: None Present (0%) Fat Layer (Subcutaneous Tissue) Exposed: Yes Tendon Exposed: No Muscle Exposed: No Langan, [name] ([id number]) Joint Exposed: No Bone Exposed: No Periwound Skin Texture Texture Color No Abnormalities Noted: No No Abnormalities Noted: No Callus: No Atrophie Blanche: No Crepitus: No Cyanosis: No Excoriation: No Ecchymosis: No Induration: No Erythema: No Rash: No Hemosiderin Staining: No Scarring: No Mottled:  No Pallor: No Moisture Rubor: No No Abnormalities Noted: No Dry / Scaly: No Temperature / Pain Maceration: No Temperature: No Abnormality Wound Preparation Ulcer Cleansing: Rinsed/Irrigated with Saline Topical Anesthetic Applied: None Electronic Signature(s) Signed: 04/05/2017 5:08:01 PM By: Gretta Cool, RN, BSN, Kim RN, BSN Entered By: Gretta Cool, RN, BSN, Kim on 04/05/2017 15:13:06 [place], [name] ([id number]) -------------------------------------------------------------------------------- Wound Assessment Details Patient Name: [name]. Date of Service: 04/05/2017 3:00 PM Medical Record Number: [id number] Patient Account Number: [id number] Date of Birth/Sex: Jan 09, 1971 (46 y.o. Male) Treating RN: Cornell Barman Primary Care Pierce Barocio: Einar Pheasant Other Clinician: Referring Claudeen Leason: Einar Pheasant Treating Mattalynn Crandle/Extender: Frann Rider in Treatment: 3 Wound Status Wound Number: 5 Primary Diabetic Wound/Ulcer of the Lower Etiology: Extremity Wound Location: Left Calcaneus Wound Open Wounding Event: Pressure Injury Status: Date Acquired: 03/15/2017 Comorbid Type I Diabetes, History of pressure Weeks Of Treatment: 2 History: wounds, Paraplegia Clustered Wound: No Photos Photo Uploaded By: Gretta Cool, RN, BSN, Kim on 04/05/2017 17:04:28 Wound Measurements Length: (cm) 1.4 Width: (cm) 0.3 Depth: (cm) 0.3 Area: (cm) 0.33 Volume: (cm) 0.099 % Reduction in Area: 47.5% % Reduction in Volume: -57.1% Epithelialization: None Tunneling: No Undermining: No Wound Description Classification: Grade 1 Foul Odor Aft Wound Margin: Distinct, outline attached Slough/Fibrin Exudate Amount: Medium Exudate Type: Serosanguineous Exudate Color: red, brown er Cleansing: No o No Wound Bed Granulation Amount: Large (67-100%) Exposed Structure Granulation Quality: Pink, Pale Fascia Exposed: No Necrotic Amount: Small (1-33%) Fat Layer (Subcutaneous Tissue) Exposed: Yes Necrotic  Quality: Adherent Slough Tendon Exposed: No Muscle Exposed: No Joint Exposed: No Bone Exposed: No Narvaez, [name] ([id number]) Periwound Skin Texture Texture Color No Abnormalities Noted: No No Abnormalities Noted: No Callus:  No Atrophie Blanche: No Crepitus: No Cyanosis: No Excoriation: No Ecchymosis: No Induration: No Erythema: No Rash: No Hemosiderin Staining: No Scarring: No Mottled: No Pallor: No Moisture Rubor: No No Abnormalities Noted: No Dry / Scaly: No Temperature / Pain Maceration: No Temperature: No Abnormality Wound Preparation Ulcer Cleansing: Rinsed/Irrigated with Saline Topical Anesthetic Applied: Other: lidocaine 4%, Electronic Signature(s) Signed: 04/05/2017 5:08:01 PM By: Gretta Cool, RN, BSN, Kim RN, BSN Entered By: Gretta Cool, RN, BSN, Kim on 04/05/2017 15:13:20 [place], [name] ([id number]) -------------------------------------------------------------------------------- Vitals Details Patient Name: [name]. Date of Service: 04/05/2017 3:00 PM Medical Record Number: [id number] Patient Account Number: [id number] Date of Birth/Sex: March 02, 1971 (46 y.o. Male) Treating RN: Cornell Barman Primary Care Tarisha Fader: Einar Pheasant Other Clinician: Referring Caroline Matters: Einar Pheasant Treating Paras Kreider/Extender: Frann Rider in Treatment: 3 Vital Signs Time Taken: 15:02 Temperature (F): 97.8 Height (in): 72 Pulse (bpm): 55 Weight (lbs): 170 Respiratory Rate (breaths/min): 16 Body Mass Index (BMI): 23.1 Reference Range: 80 - 120 mg / dl Electronic Signature(s) Signed: 04/05/2017 5:08:01 PM By: Gretta Cool, RN, BSN, Kim RN, BSN Entered By: Gretta Cool, RN, BSN, Kim on 04/05/2017 15:03:58
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
## 2017-04-12 ENCOUNTER — Encounter: Payer: Medicare Other | Attending: Surgery | Admitting: Surgery

## 2017-04-12 DIAGNOSIS — G8254 Quadriplegia, C5-C7 incomplete: Secondary | ICD-10-CM | POA: Insufficient documentation

## 2017-04-12 DIAGNOSIS — F329 Major depressive disorder, single episode, unspecified: Secondary | ICD-10-CM | POA: Insufficient documentation

## 2017-04-12 DIAGNOSIS — Z882 Allergy status to sulfonamides status: Secondary | ICD-10-CM | POA: Insufficient documentation

## 2017-04-12 DIAGNOSIS — L89622 Pressure ulcer of left heel, stage 2: Secondary | ICD-10-CM | POA: Diagnosis not present

## 2017-04-12 DIAGNOSIS — Z91041 Radiographic dye allergy status: Secondary | ICD-10-CM | POA: Insufficient documentation

## 2017-04-12 DIAGNOSIS — Y839 Surgical procedure, unspecified as the cause of abnormal reaction of the patient, or of later complication, without mention of misadventure at the time of the procedure: Secondary | ICD-10-CM | POA: Diagnosis not present

## 2017-04-12 DIAGNOSIS — E10622 Type 1 diabetes mellitus with other skin ulcer: Secondary | ICD-10-CM | POA: Diagnosis present

## 2017-04-12 DIAGNOSIS — T8131XA Disruption of external operation (surgical) wound, not elsewhere classified, initial encounter: Secondary | ICD-10-CM | POA: Diagnosis not present

## 2017-04-12 DIAGNOSIS — G8929 Other chronic pain: Secondary | ICD-10-CM | POA: Diagnosis not present

## 2017-04-12 DIAGNOSIS — Z888 Allergy status to other drugs, medicaments and biological substances status: Secondary | ICD-10-CM | POA: Insufficient documentation

## 2017-04-12 DIAGNOSIS — L8943 Pressure ulcer of contiguous site of back, buttock and hip, stage 3: Secondary | ICD-10-CM | POA: Insufficient documentation

## 2017-04-12 DIAGNOSIS — Z794 Long term (current) use of insulin: Secondary | ICD-10-CM | POA: Diagnosis not present

## 2017-04-12 DIAGNOSIS — Z79899 Other long term (current) drug therapy: Secondary | ICD-10-CM | POA: Insufficient documentation

## 2017-04-13 ENCOUNTER — Encounter: Payer: Self-pay | Admitting: Internal Medicine

## 2017-04-13 DIAGNOSIS — M25519 Pain in unspecified shoulder: Secondary | ICD-10-CM

## 2017-04-13 NOTE — Progress Notes (Signed)
AYVION, KAVANAGH ([id number]) Visit Report for 04/12/2017 Arrival Information Details Patient Name: DEVONTAYE, GROUND. Date of Service: 04/12/2017 3:45 PM Medical Record Number: [id number] Patient Account Number: [id number] Date of Birth/Sex: December 01, 1971 (46 y.o. Male) Treating RN: Cornell Barman Primary Care Quaran Kedzierski: Einar Pheasant Other Clinician: Referring Pema Thomure: Einar Pheasant Treating Sausha Raymond/Extender: Frann Rider in Treatment: 4 Visit Information History Since Last Visit Added or deleted any medications: No Patient Arrived: Wheel Chair Any new allergies or adverse reactions: No Arrival Time: 15:45 Had a fall or experienced change in No activities of daily living that may affect Accompanied By: self risk of falls: Transfer Assistance: None Signs or symptoms of abuse/neglect since last No Patient Identification Verified: Yes visito Secondary Verification Process Yes Hospitalized since last visit: No Completed: Has Dressing in Place as Prescribed: Yes Patient Requires Transmission-Based No Pain Present Now: No Precautions: Patient Has Alerts: No Electronic Signature(s) Signed: 04/12/2017 4:45:18 PM By: Gretta Cool, RN, BSN, Kim RN, BSN Entered By: Gretta Cool, RN, BSN, Kim on 04/12/2017 15:45:32 [place], [name] ([id number]) -------------------------------------------------------------------------------- Encounter Discharge Information Details Patient Name: [name]. Date of Service: 04/12/2017 3:45 PM Medical Record Number: [id number] Patient Account Number: [id number] Date of Birth/Sex: 16-May-1971 (46 y.o. Male) Treating RN: Cornell Barman Primary Care Sosha Shepherd: Einar Pheasant Other Clinician: Referring Jaqlyn Gruenhagen: Einar Pheasant Treating Michale Emmerich/Extender: Frann Rider in Treatment: 4 Encounter Discharge Information Items Discharge Pain Level: 0 Discharge Condition: Stable Ambulatory Status: Wheelchair Discharge Destination: Home Transportation: Private  Auto Accompanied By: self Schedule Follow-up Appointment: Yes Medication Reconciliation completed Yes and provided to Patient/Care Suann Klier: Patient Clinical Summary of Care: Declined Electronic Signature(s) Signed: 04/12/2017 4:45:18 PM By: Gretta Cool RN, BSN, Kim RN, BSN Previous Signature: 04/12/2017 4:07:21 PM Version By: Ruthine Dose Previous Signature: 04/12/2017 4:06:49 PM Version By: Ruthine Dose Entered By: Gretta Cool RN, BSN, Kim on 04/12/2017 16:22:02 [place], [name] ([id number]) -------------------------------------------------------------------------------- Lower Extremity Assessment Details Patient Name: [name]. Date of Service: 04/12/2017 3:45 PM Medical Record Number: [id number] Patient Account Number: [id number] Date of Birth/Sex: [birth year] (46 y.o. Male) Treating RN: Cornell Barman Primary Care Deepak Bless: Einar Pheasant Other Clinician: Referring Trinton Prewitt: Einar Pheasant Treating Johnel Yielding/Extender: Frann Rider in Treatment: 4 Electronic Signature(s) Signed: 04/12/2017 4:45:18 PM By: Gretta Cool, RN, BSN, Kim RN, BSN Entered By: Gretta Cool, RN, BSN, Kim on 04/12/2017 15:51:36 [place], [name] ([id number]) -------------------------------------------------------------------------------- Multi Wound Chart Details Patient Name: [name]. Date of Service: 04/12/2017 3:45 PM Medical Record Number: [id number] Patient Account Number: [id number] Date of Birth/Sex: 17-Feb-1971 (46 y.o. Male) Treating RN: Cornell Barman Primary Care Micala Saltsman: Einar Pheasant Other Clinician: Referring Kaileb Monsanto: Einar Pheasant Treating Lennyx Verdell/Extender: Frann Rider in Treatment: 4 Vital Signs Height(in): 72 Pulse(bpm): 62 Weight(lbs): 170 Blood Pressure 163/85 (mmHg): Body Mass Index(BMI): 23 Temperature(F): 98.3 Respiratory Rate 16 (breaths/min): Photos: [4:No Photos] [5:No Photos] [N/A:N/A] Wound Location: [4:Medial Sacrum] [5:Left Calcaneus] [N/A:N/A] Wounding Event:  [4:Surgical Injury] [5:Pressure Injury] [N/A:N/A] Primary Etiology: [4:Open Surgical Wound] [5:Diabetic Wound/Ulcer of the Lower Extremity] [N/A:N/A] Date Acquired: [4:10/21/2016] [5:03/15/2017] [N/A:N/A] Weeks of Treatment: [4:4] [5:3] [N/A:N/A] Wound Status: [4:Open] [5:Open] [N/A:N/A] Measurements L x W x D 1.5x1.6x0.1 [5:0.4x0.6x0.2] [N/A:N/A] (cm) Area (cm) : [4:1.885] [5:0.188] [N/A:N/A] Volume (cm) : [4:0.188] [5:0.038] [N/A:N/A] % Reduction in Area: [4:68.00%] [5:70.10%] [N/A:N/A] % Reduction in Volume: 84.00% [5:39.70%] [N/A:N/A] Classification: [4:Full Thickness Without Exposed Support Structures] [5:Grade 1] [N/A:N/A] Debridement: [4:N/A] [5:Debridement ([id number])] [N/A:N/A] Pre-procedure [4:N/A] [5:15:56] [N/A:N/A] Verification/Time Out Taken: Tissue Debrided: [4:N/A] [5:Fibrin/Slough, Subcutaneous] [N/A:N/A] Level: [4:N/A] [5:Skin/Subcutaneous Tissue] [N/A:N/A] Debridement Area (sq [4:N/A] [5:0.24] [  N/A:N/A] cm): Instrument: [4:N/A] [5:Curette] [N/A:N/A] Bleeding: [4:N/A] [5:Minimum] [N/A:N/A] Hemostasis Achieved: N/A Pressure N/A Procedural Pain: N/A Insensate N/A Post Procedural Pain: N/A Insensate N/A Debridement Treatment N/A Procedure was tolerated N/A Response: well Post Debridement N/A 0.4x0.6x0.2 N/A Measurements L x W x D (cm) Post Debridement N/A 0.038 N/A Volume: (cm) Periwound Skin Texture: No Abnormalities Noted No Abnormalities Noted N/A Periwound Skin No Abnormalities Noted No Abnormalities Noted N/A Moisture: Periwound Skin Color: No Abnormalities Noted No Abnormalities Noted N/A Tenderness on No No N/A Palpation: Procedures Performed: N/A Debridement N/A Treatment Notes Electronic Signature(s) Signed: 04/12/2017 4:03:01 PM By: Christin Fudge MD, FACS Entered By: Christin Fudge on 04/12/2017 16:03:01 [place], [name] ([id number]) -------------------------------------------------------------------------------- [place]  Details Patient Name: [name]. Date of Service: 04/12/2017 3:45 PM Medical Record Number: [id number] Patient Account Number: [id number] Date of Birth/Sex: 1971-02-16 (46 y.o. Male) Treating RN: Cornell Barman Primary Care Hallie Ishida: Einar Pheasant Other Clinician: Referring Novalie Leamy: Einar Pheasant Treating Ellean Firman/Extender: Frann Rider in Treatment: 4 Active Inactive ` Orientation to the Wound Care Program Nursing Diagnoses: Knowledge deficit related to the wound healing center program Goals: Patient/caregiver will verbalize understanding of the [facility] Program Date Initiated: 03/12/2017 Target Resolution Date: 07/12/2017 Goal Status: Active Interventions: Provide education on orientation to the wound center Notes: ` Wound/Skin Impairment Nursing Diagnoses: Impaired tissue integrity Knowledge deficit related to ulceration/compromised skin integrity Goals: Patient/caregiver will verbalize understanding of skin care regimen Date Initiated: 03/12/2017 Target Resolution Date: 07/12/2017 Goal Status: Active Ulcer/skin breakdown will have a volume reduction of 30% by week 4 Date Initiated: 03/12/2017 Target Resolution Date: 07/12/2017 Goal Status: Active Ulcer/skin breakdown will have a volume reduction of 50% by week 8 Date Initiated: 03/12/2017 Target Resolution Date: 07/12/2017 Goal Status: Active Ulcer/skin breakdown will have a volume reduction of 80% by week 12 Date Initiated: 03/12/2017 Target Resolution Date: 07/12/2017 Goal Status: Active Ulcer/skin breakdown will heal within 14 weeks DAVIUS, GOUDEAU ([id number]) Date Initiated: 03/12/2017 Target Resolution Date: 07/12/2017 Goal Status: Active Interventions: Assess patient/caregiver ability to obtain necessary supplies Assess patient/caregiver ability to perform ulcer/skin care regimen upon admission and as needed Assess ulceration(s) every visit Provide education on ulcer and skin care Treatment  Activities: Referred to DME Jaxan Michel for dressing supplies : 03/12/2017 Skin care regimen initiated : 03/12/2017 Topical wound management initiated : 03/12/2017 Notes: Electronic Signature(s) Signed: 04/12/2017 4:45:18 PM By: Gretta Cool, RN, BSN, Kim RN, BSN Entered By: Gretta Cool, RN, BSN, Kim on 04/12/2017 15:51:41 [place], [name] ([id number]) -------------------------------------------------------------------------------- Pain Assessment Details Patient Name: [name]. Date of Service: 04/12/2017 3:45 PM Medical Record Number: [id number] Patient Account Number: [id number] Date of Birth/Sex: [birth year] (46 y.o. Male) Treating RN: Cornell Barman Primary Care Kypton Eltringham: Einar Pheasant Other Clinician: Referring Jaliyah Fotheringham: Einar Pheasant Treating Mya Suell/Extender: Frann Rider in Treatment: 4 Active Problems Location of Pain Severity and Description of Pain Patient Has Paino No Site Locations With Dressing Change: No Pain Management and Medication Current Pain Management: Electronic Signature(s) Signed: 04/12/2017 4:45:18 PM By: Gretta Cool, RN, BSN, Kim RN, BSN Entered By: Gretta Cool, RN, BSN, Kim on 04/12/2017 15:45:39 [place], [name] ([id number]) -------------------------------------------------------------------------------- Wound Assessment Details Patient Name: [name]. Date of Service: 04/12/2017 3:45 PM Medical Record Number: [id number] Patient Account Number: [id number] Date of Birth/Sex: [birth year] (46 y.o. Male) Treating RN: Cornell Barman Primary Care Masashi Snowdon: Einar Pheasant Other Clinician: Referring Louise Rawson: Einar Pheasant Treating Alechia Lezama/Extender: Frann Rider in Treatment: 4 Wound Status Wound Number: 4 Primary Etiology: Open Surgical Wound Wound Location:  Medial Sacrum Wound Status: Open Wounding Event: Surgical Injury Date Acquired: 10/21/2016 Weeks Of Treatment: 4 Clustered Wound: No Wound Measurements Length: (cm) 1.5 Width: (cm) 1.6 Depth: (cm)  0.1 Area: (cm) 1.885 Volume: (cm) 0.188 % Reduction in Area: 68% % Reduction in Volume: 84% Wound Description Full Thickness Without Exposed Classification: Support Structures Periwound Skin Texture Texture Color No Abnormalities Noted: No No Abnormalities Noted: No Moisture No Abnormalities Noted: No Treatment Notes Wound #4 (Medial Sacrum) 1. Cleansed with: Clean wound with Normal Saline 4. Dressing Applied: Hydrafera Blue 5. Secondary Dressing Applied Bordered Foam Dressing Notes Prisma AG on heel Electronic Signature(s) Signed: 04/12/2017 4:45:18 PM By: Gretta Cool, RN, BSN, Kim RN, BSN [place], [name] ([id number]) Entered By: Gretta Cool, RN, BSN, Kim on 04/12/2017 15:50:57 [place], [name] ([id number]) -------------------------------------------------------------------------------- Wound Assessment Details Patient Name: [name]. Date of Service: 04/12/2017 3:45 PM Medical Record Number: [id number] Patient Account Number: [id number] Date of Birth/Sex: August 07, 1971 (46 y.o. Male) Treating RN: Cornell Barman Primary Care Tritia Endo: Einar Pheasant Other Clinician: Referring Kindred Heying: Einar Pheasant Treating Darlis Wragg/Extender: Frann Rider in Treatment: 4 Wound Status Wound Number: 5 Primary Diabetic Wound/Ulcer of the Lower Etiology: Extremity Wound Location: Left Calcaneus Wound Status: Open Wounding Event: Pressure Injury Date Acquired: 03/15/2017 Weeks Of Treatment: 3 Clustered Wound: No Wound Measurements Length: (cm) 0.4 Width: (cm) 0.6 Depth: (cm) 0.2 Area: (cm) 0.188 Volume: (cm) 0.038 % Reduction in Area: 70.1% % Reduction in Volume: 39.7% Wound Description Classification: Grade 1 Periwound Skin Texture Texture Color No Abnormalities Noted: No No Abnormalities Noted: No Moisture No Abnormalities Noted: No Treatment Notes Wound #5 (Left Calcaneus) 1. Cleansed with: Clean wound with Normal Saline 4. Dressing Applied: Hydrafera Blue 5.  Secondary Dressing Applied Bordered Foam Dressing Notes Prisma AG on heel Electronic Signature(s) Signed: 04/12/2017 4:45:18 PM By: Gretta Cool, RN, BSN, Kim RN, BSN Entered By: Gretta Cool, RN, BSN, Kim on 04/12/2017 15:50:57 [place], [name] ([id number]) [place], [name] ([id number]) -------------------------------------------------------------------------------- Vitals Details Patient Name: [name]. Date of Service: 04/12/2017 3:45 PM Medical Record Number: [id number] Patient Account Number: [id number] Date of Birth/Sex: 1971-03-31 (46 y.o. Male) Treating RN: Cornell Barman Primary Care Abdulmalik Darco: Einar Pheasant Other Clinician: Referring Jeremiah Tarpley: Einar Pheasant Treating Tajai Ihde/Extender: Frann Rider in Treatment: 4 Vital Signs Time Taken: 15:45 Temperature (F): 98.3 Height (in): 72 Pulse (bpm): 62 Weight (lbs): 170 Respiratory Rate (breaths/min): 16 Body Mass Index (BMI): 23.1 Blood Pressure (mmHg): 163/85 Reference Range: 80 - 120 mg / dl Electronic Signature(s) Signed: 04/12/2017 4:45:18 PM By: Gretta Cool, RN, BSN, Kim RN, BSN Entered By: Gretta Cool, RN, BSN, Kim on 04/12/2017 15:46:13
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
## 2017-04-14 NOTE — Telephone Encounter (Signed)
Order placed for rheumatology referral.

## 2017-04-16 ENCOUNTER — Encounter: Payer: Self-pay | Admitting: Internal Medicine

## 2017-04-19 ENCOUNTER — Encounter: Payer: Self-pay | Admitting: General Surgery

## 2017-04-19 ENCOUNTER — Encounter: Payer: Medicare Other | Admitting: Surgery

## 2017-04-19 DIAGNOSIS — E10622 Type 1 diabetes mellitus with other skin ulcer: Secondary | ICD-10-CM | POA: Diagnosis not present

## 2017-04-19 NOTE — Progress Notes (Signed)
[name] ([id number]) Visit Report for 04/19/2017 Arrival Information Details Patient Name: [name]. Date of Service: 04/19/2017 9:15 AM Medical Record Number: [id number] Patient Account Number: [id number] Date of Birth/Sex: 1971-06-02 (46 y.o. Male) Treating RN: Cornell Barman Primary Care Gursimran Litaker: Einar Pheasant Other Clinician: Referring Myron Lona: Einar Pheasant Treating Amarylis Rovito/Extender: Frann Rider in Treatment: 5 Visit Information History Since Last Visit Added or deleted any medications: No Patient Arrived: Wheel Chair Any new allergies or adverse reactions: No Arrival Time: 09:18 Had a fall or experienced change in No activities of daily living that may affect Accompanied By: self risk of falls: Transfer Assistance: None Signs or symptoms of abuse/neglect since last No Patient Identification Verified: Yes visito Secondary Verification Process Yes Has Dressing in Place as Prescribed: Yes Completed: Pain Present Now: No Patient Requires Transmission-Based No Precautions: Patient Has Alerts: No Electronic Signature(s) Signed: 04/19/2017 10:54:18 AM By: Gretta Cool, RN, BSN, Kim RN, BSN Entered By: Gretta Cool, RN, BSN, Kim on 04/19/2017 09:19:00 [name], [name] ([id number]) -------------------------------------------------------------------------------- Clinic Level of Care Assessment Details Patient Name: [name]. Date of Service: 04/19/2017 9:15 AM Medical Record Number: [id number] Patient Account Number: [id number] Date of Birth/Sex: [birth year] (46 y.o. Male) Treating RN: Cornell Barman Primary Care Mico Spark: Einar Pheasant Other Clinician: Referring Griffon Herberg: Einar Pheasant Treating Marquesa Rath/Extender: Frann Rider in Treatment: 5 Clinic Level of Care Assessment Items TOOL 4 Quantity Score []  - Use when only an EandM is performed on FOLLOW-UP visit 0 ASSESSMENTS - Nursing Assessment / Reassessment []  - Reassessment of Co-morbidities  (includes updates in patient status) 0 X - Reassessment of Adherence to Treatment Plan 1 5 ASSESSMENTS - Wound and Skin Assessment / Reassessment []  - Simple Wound Assessment / Reassessment - one wound 0 X - Complex Wound Assessment / Reassessment - multiple wounds 2 5 []  - Dermatologic / Skin Assessment (not related to wound area) 0 ASSESSMENTS - Focused Assessment []  - Circumferential Edema Measurements - multi extremities 0 []  - Nutritional Assessment / Counseling / Intervention 0 []  - Lower Extremity Assessment (monofilament, tuning fork, pulses) 0 []  - Peripheral Arterial Disease Assessment (using hand held doppler) 0 ASSESSMENTS - Ostomy and/or Continence Assessment and Care []  - Incontinence Assessment and Management 0 []  - Ostomy Care Assessment and Management (repouching, etc.) 0 PROCESS - Coordination of Care X - Simple Patient / Family Education for ongoing care 1 15 []  - Complex (extensive) Patient / Family Education for ongoing care 0 []  - Staff obtains Programmer, systems, Records, Test Results / Process Orders 0 []  - Staff telephones HHA, Nursing Homes / Clarify orders / etc 0 []  - Routine Transfer to another Facility (non-emergent condition) 0 [name], [name] ([id number]) []  - Routine Hospital Admission (non-emergent condition) 0 []  - New Admissions / Biomedical engineer / Ordering NPWT, Apligraf, etc. 0 []  - Emergency Hospital Admission (emergent condition) 0 X - Simple Discharge Coordination 1 10 []  - Complex (extensive) Discharge Coordination 0 PROCESS - Special Needs []  - Pediatric / Minor Patient Management 0 []  - Isolation Patient Management 0 []  - Hearing / Language / Visual special needs 0 []  - Assessment of Community assistance (transportation, D/C planning, etc.) 0 []  - Additional assistance / Altered mentation 0 []  - Support Surface(s) Assessment (bed, cushion, seat, etc.) 0 INTERVENTIONS - Wound Cleansing / Measurement []  - Simple Wound Cleansing - one wound  0 X - Complex Wound Cleansing - multiple wounds 2 5 X - Wound Imaging (photographs - any number of wounds) 1 5 []  -  Wound Tracing (instead of photographs) 0 []  - Simple Wound Measurement - one wound 0 X - Complex Wound Measurement - multiple wounds 2 5 INTERVENTIONS - Wound Dressings X - Small Wound Dressing one or multiple wounds 1 10 X - Medium Wound Dressing one or multiple wounds 1 15 []  - Large Wound Dressing one or multiple wounds 0 []  - Application of Medications - topical 0 []  - Application of Medications - injection 0 INTERVENTIONS - Miscellaneous []  - External ear exam 0 Finkel, [name] ([id number]) []  - Specimen Collection (cultures, biopsies, blood, body fluids, etc.) 0 []  - Specimen(s) / Culture(s) sent or taken to Lab for analysis 0 []  - Patient Transfer (multiple staff / Harrel Lemon Lift / Similar devices) 0 []  - Simple Staple / Suture removal (25 or less) 0 []  - Complex Staple / Suture removal (26 or more) 0 []  - Hypo / Hyperglycemic Management (close monitor of Blood Glucose) 0 []  - Ankle / Brachial Index (ABI) - do not check if billed separately 0 X - Vital Signs 1 5 Has the patient been seen at the hospital within the last three years: Yes Total Score: 95 Level Of Care: New/Established - Level 3 Electronic Signature(s) Signed: 04/19/2017 2:37:11 PM By: Gretta Cool, RN, BSN, Kim RN, BSN Entered By: Gretta Cool, RN, BSN, Kim on 04/19/2017 11:03:27 [name], [name] ([id number]) -------------------------------------------------------------------------------- Encounter Discharge Information Details Patient Name: [name]. Date of Service: 04/19/2017 9:15 AM Medical Record Number: [id number] Patient Account Number: [id number] Date of Birth/Sex: 1971/09/19 (46 y.o. Male) Treating RN: Cornell Barman Primary Care [name] [name]: Einar Pheasant Other Clinician: Referring Idelle Reimann: Einar Pheasant Treating Giulietta Prokop/Extender: Frann Rider in Treatment: 5 Encounter Discharge Information  Items Schedule Follow-up Appointment: No Medication Reconciliation completed and No provided to Patient/Care Corena Tilson: Patient Clinical Summary of Care: Declined Electronic Signature(s) Signed: 04/19/2017 9:51:33 AM By: Ruthine Dose Entered By: Ruthine Dose on 04/19/2017 09:51:33 [place]ough, [name] ([id number]) -------------------------------------------------------------------------------- Lower Extremity Assessment Details Patient Name: [name]. Date of Service: 04/19/2017 9:15 AM Medical Record Number: [id number] Patient Account Number: [id number] Date of Birth/Sex: 02-03-1971 (46 y.o. Male) Treating RN: Cornell Barman Primary Care Daley Gosse: Einar Pheasant Other Clinician: Referring Alonnie Bieker: Einar Pheasant Treating Letha Mirabal/Extender: Frann Rider in Treatment: 5 Vascular Assessment Pulses: Dorsalis Pedis Palpable: [Left:Yes] Posterior Tibial Palpable: [Left:Yes] Toe Nail Assessment Left: Right: Thick: No Discolored: No Deformed: No Improper Length and Hygiene: No Electronic Signature(s) Signed: 04/19/2017 10:54:18 AM By: Gretta Cool, RN, BSN, Kim RN, BSN Entered By: Gretta Cool, RN, BSN, Kim on 04/19/2017 09:30:07 [place], [name] ([id number]) -------------------------------------------------------------------------------- Multi Wound Chart Details Patient Name: [name]. Date of Service: 04/19/2017 9:15 AM Medical Record Number: [id number] Patient Account Number: [id number] Date of Birth/Sex: 1971/08/08 (45 y.o. 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 Comorbid History: Type I Diabetes, History of Type I Diabetes, History of N/A pressure wounds, pressure wounds, Paraplegia Paraplegia Date Acquired: 10/21/2016 03/15/2017 N/A Weeks of Treatment: 5 4 N/A Wound Status: Open Open N/A Measurements L x W x D 1.6x1.6x0.2 0.3x0.5x0.2 N/A (cm) Area (cm) : 2.011 0.118 N/A Volume (cm) : 0.402 0.024 N/A %  Reduction in Area: 65.90% 81.20% N/A % Reduction in Volume: 65.90% 61.90% N/A Classification: Full Thickness Without Grade 1 N/A Exposed Support Structures Exudate Amount: N/A None Present N/A Wound Margin: N/A Flat and Intact N/A Granulation Amount: Large (67-100%) Small (1-33%) N/A Necrotic Amount: None Present (0%) Large (67-100%) N/A Necrotic Tissue: N/A Eschar N/A Fritzler, [name]. ([id number]) Epithelialization: None None N/A Periwound Skin Texture: No Abnormalities Noted Excoriation: No N/A Induration: No Callus: No Crepitus: No Rash: No Scarring: No Periwound Skin Maceration: Yes Maceration: No N/A Moisture: Dry/Scaly: No Periwound Skin Color: No Abnormalities Noted Atrophie Blanche: No N/A Cyanosis: No Ecchymosis: No Erythema: No Hemosiderin Staining: No Mottled: No Pallor: No Rubor: No Tenderness on No No N/A Palpation: Wound Preparation: Ulcer Cleansing: Ulcer Cleansing: N/A Rinsed/Irrigated with Rinsed/Irrigated with Saline Saline Topical Anesthetic Topical Anesthetic Applied: Other: lidocaine Applied: Other: lidocaine 4% 4% Treatment Notes Electronic Signature(s) Signed: 04/19/2017 10:43:21 AM By: Christin Fudge MD, FACS Entered By: Christin Fudge on 04/19/2017 10:43:20 Gracy, [name] ([id number]) -------------------------------------------------------------------------------- [facility] Details Patient Name: [name]. Date of Service: 04/19/2017 9:15  AM Medical Record Number: [id number] Patient Account Number: [id number] Date of Birth/Sex: 07-13-1971 (46 y.o. Male) Treating RN: Cornell Barman Primary Care Taariq Leitz: Einar Pheasant Other Clinician: Referring Marley Pakula: Einar Pheasant Treating Dalylah Ramey/Extender: Frann Rider in Treatment: 5 Active Inactive ` Orientation to the Wound Care Program Nursing Diagnoses: Knowledge deficit related to the wound healing center program Goals: Patient/caregiver will verbalize understanding of the [facility] Program Date Initiated: 03/12/2017 Target Resolution Date: 07/12/2017 Goal Status: Active Interventions: Provide education on orientation to the wound center Notes: ` Wound/Skin Impairment Nursing Diagnoses: Impaired tissue integrity Knowledge deficit related to ulceration/compromised skin integrity Goals: Patient/caregiver will verbalize understanding of skin care regimen Date Initiated: 03/12/2017 Target Resolution Date: 07/12/2017 Goal Status: Active Ulcer/skin breakdown will have a volume reduction of 30% by week 4 Date Initiated: 03/12/2017 Target Resolution Date: 07/12/2017 Goal Status: Active Ulcer/skin breakdown will have a volume reduction of 50% by week 8 Date Initiated: 03/12/2017 Target Resolution Date: 07/12/2017 Goal Status: Active Ulcer/skin breakdown will have a volume reduction of 80% by week 12 Date Initiated: 03/12/2017 Target Resolution Date: 07/12/2017 Goal Status: Active Ulcer/skin breakdown will heal within 14 weeks OSWIN, GRIFFITH ([id number]) Date Initiated: 03/12/2017 Target Resolution Date: 07/12/2017 Goal Status: Active Interventions: Assess patient/caregiver ability to obtain necessary supplies Assess patient/caregiver ability to perform ulcer/skin care regimen upon admission and as needed Assess ulceration(s) every visit Provide education on ulcer and skin care Treatment Activities: Referred to DME [facility] for dressing supplies : 03/12/2017 Skin care  regimen initiated : 03/12/2017 Topical wound management initiated : 03/12/2017 Notes: Electronic Signature(s) Signed: 04/19/2017 10:54:18 AM By: Gretta Cool, RN, BSN, Kim RN, BSN Entered By: Gretta Cool, RN, BSN, Kim on 04/19/2017 09:32:45 Cooprider, [name] ([id number]) -------------------------------------------------------------------------------- Pain Assessment Details Patient Name: [name]. Date of Service: 04/19/2017 9:15 AM Medical Record Number: [id number] Patient Account Number: [id number] Date of Birth/Sex: [birth year] (46 y.o. Male) Treating RN: Cornell Barman Primary Care Messiyah Waterson: Einar Pheasant Other Clinician: Referring Zenita Kister: Einar Pheasant Treating Ivet Guerrieri/Extender: Frann Rider in Treatment: 5 Active Problems Location of Pain Severity and Description of Pain Patient Has Paino No Site Locations With Dressing Change: No Pain Management and Medication Current Pain Management: Electronic Signature(s) Signed: 04/19/2017 10:54:18 AM By: Gretta Cool, RN, BSN, Kim RN, BSN Entered By: Gretta Cool, RN, BSN, Kim on 04/19/2017 09:19:08 Edinger, [name] ([id number]) -------------------------------------------------------------------------------- Wound Assessment Details Patient Name: [name]. Date of  Service: 04/19/2017 9:15 AM Medical Record Number: [id number] Patient Account Number: [id number] Date of Birth/Sex: 1971-03-28 (46 y.o. Male) Treating RN: Cornell Barman Primary Care Veretta Sabourin: Einar Pheasant Other Clinician: Referring Seraphim Affinito: Einar Pheasant Treating Mckinna Demars/Extender: Frann Rider in Treatment: 5 Wound Status Wound Number: 4 Primary Open Surgical Wound Etiology: Wound Location: Sacrum - Medial Wound Open Wounding Event: Surgical Injury Status: Date Acquired: 10/21/2016 Comorbid Type I Diabetes, History of pressure Weeks Of Treatment: 5 History: wounds, Paraplegia Clustered Wound: No Photos Photo Uploaded By: Gretta Cool, RN, BSN, Kim on 04/19/2017  09:31:39 Wound Measurements Length: (cm) 1.6 Width: (cm) 1.6 Depth: (cm) 0.2 Area: (cm) 2.011 Volume: (cm) 0.402 % Reduction in Area: 65.9% % Reduction in Volume: 65.9% Epithelialization: None Tunneling: No Undermining: No Wound Description Full Thickness Without Exposed Classification: Support Structures Wound Bed Granulation Amount: Large (67-100%) Necrotic Amount: None Present (0%) Periwound Skin Texture Texture Color No Abnormalities Noted: No No Abnormalities Noted: No Moisture No Abnormalities Noted: No Maceration: Yes Riviello, [name] ([id number]) Wound Preparation Ulcer Cleansing: Rinsed/Irrigated with Saline Topical Anesthetic Applied: Other: lidocaine 4%, Electronic Signature(s) Signed: 04/19/2017 10:54:18 AM By: Gretta Cool, RN, BSN, Kim RN, BSN Entered By: Gretta Cool, RN, BSN, Kim on 04/19/2017 67:12:45 Schellhorn, [name] ([id number]) -------------------------------------------------------------------------------- Wound Assessment Details Patient Name: [name]. Date of Service: 04/19/2017 9:15 AM Medical Record Number: [id number] Patient Account Number: [id number] Date of Birth/Sex: 26-Dec-1970 (46 y.o. Male) Treating RN: Cornell Barman Primary Care Budd Freiermuth: Einar Pheasant Other Clinician: Referring Dyan Labarbera: Einar Pheasant Treating Elizeth Weinrich/Extender: Frann Rider in Treatment: 5 Wound Status Wound Number: 5 Primary Diabetic Wound/Ulcer of the Lower Etiology: Extremity Wound Location: Left Calcaneus Wound Open Wounding Event: Pressure Injury Status: Date Acquired: 03/15/2017 Comorbid Type I Diabetes, History of pressure Weeks Of Treatment: 4 History: wounds, Paraplegia Clustered Wound: No Photos Photo Uploaded By: Gretta Cool, RN, BSN, Kim on 04/19/2017 09:31:39 Wound Measurements Length: (cm) 0.3 Width: (cm) 0.5 Depth: (cm) 0.2 Area: (cm) 0.118 Volume: (cm) 0.024 % Reduction in Area: 81.2% % Reduction in Volume: 61.9% Epithelialization:  None Tunneling: No Undermining: No Wound Description Classification: Grade 1 Foul Odor After Wound Margin: Flat and Intact Slough/Fibrino Exudate Amount: None Present Cleansing: No No Wound Bed Granulation Amount: Small (1-33%) Exposed Structure Necrotic Amount: Large (67-100%) Fascia Exposed: No Necrotic Quality: Eschar Fat Layer (Subcutaneous Tissue) Exposed: Yes Tendon Exposed: No Muscle Exposed: No Joint Exposed: No Bone Exposed: No Periwound Skin Texture Maguire, [name] ([id number]) Texture Color No Abnormalities Noted: No No Abnormalities Noted: No Callus: No Atrophie Blanche: No Crepitus: No Cyanosis: No Excoriation: No Ecchymosis: No Induration: No Erythema: No Rash: No Hemosiderin Staining: No Scarring: No Mottled: No Pallor: No Moisture Rubor: No No Abnormalities Noted: No Dry / Scaly: No Maceration: No Wound Preparation Ulcer Cleansing: Rinsed/Irrigated with Saline Topical Anesthetic Applied: Other: lidocaine 4%, Electronic Signature(s) Signed: 04/19/2017 10:54:18 AM By: Gretta Cool, RN, BSN, Kim RN, BSN Entered By: Gretta Cool, RN, BSN, Kim on 04/19/2017 09:73:53 [place], [name] ([id number]) -------------------------------------------------------------------------------- Vitals Details Patient Name: [name]. Date of Service: 04/19/2017 9:15 AM Medical Record Number: [id number] Patient Account Number: [id number] Date of Birth/Sex: January 24, 1971 (46 y.o. Male) Treating RN: Cornell Barman Primary Care Cortavious Nix: Einar Pheasant Other Clinician: Referring Amayia Ciano: Einar Pheasant Treating Chinmayi Rumer/Extender: Frann Rider in Treatment: 5 Vital Signs Time Taken: 09:19 Temperature (F): 97.5 Height (in): 72 Pulse (bpm): 63 Weight (lbs): 170 Respiratory Rate (breaths/min): 16 Body Mass Index (BMI): 23.1 Blood Pressure (mmHg): 145/87 Reference Range: 80 - 120 mg / dl Electronic Signature(s)  Signed: 04/19/2017 10:54:18 AM By: Gretta Cool, RN, BSN, Kim RN,  BSN Entered By: Gretta Cool, RN, BSN, Kim on 04/19/2017 54:56:25
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
## 2017-04-22 ENCOUNTER — Ambulatory Visit: Payer: 59 | Admitting: General Surgery

## 2017-04-26 ENCOUNTER — Encounter: Payer: Self-pay | Admitting: General Surgery

## 2017-04-26 ENCOUNTER — Ambulatory Visit (INDEPENDENT_AMBULATORY_CARE_PROVIDER_SITE_OTHER): Payer: Medicare Other | Admitting: General Surgery

## 2017-04-26 ENCOUNTER — Encounter: Payer: Medicare Other | Admitting: Surgery

## 2017-04-26 VITALS — BP 100/60 | HR 62 | Resp 14 | Wt 175.0 lb

## 2017-04-26 DIAGNOSIS — L98491 Non-pressure chronic ulcer of skin of other sites limited to breakdown of skin: Secondary | ICD-10-CM

## 2017-04-26 DIAGNOSIS — E10622 Type 1 diabetes mellitus with other skin ulcer: Secondary | ICD-10-CM | POA: Diagnosis not present
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
## 2017-04-27 NOTE — Progress Notes (Signed)
TILMON, WISEHART ([id number]) Visit Report for 04/26/2017 Arrival Information Details Patient Name: NATHANAEL, KRIST. Date of Service: 04/26/2017 3:00 PM Medical Record Number: [id number] Patient Account Number: [id number] Date of Birth/Sex: [birth year] (46 y.o. Male) Treating RN: Cornell Barman Primary Care Lynzie Cliburn: Einar Pheasant Other Clinician: Referring Lyly Canizales: Einar Pheasant Treating Keonda Dow/Extender: Frann Rider in Treatment: 6 Visit Information History Since Last Visit Added or deleted any medications: No Patient Arrived: Wheel Chair Any new allergies or adverse reactions: No Arrival Time: 14:51 Had a fall or experienced change in No activities of daily living that may affect Accompanied By: self risk of falls: Transfer Assistance: Manual Signs or symptoms of abuse/neglect since last No Patient Identification Verified: Yes visito Secondary Verification Process Yes Hospitalized since last visit: No Completed: Has Dressing in Place as Prescribed: Yes Patient Requires Transmission-Based No Pain Present Now: No Precautions: Patient Has Alerts: No Electronic Signature(s) Signed: 04/27/2017 8:31:20 AM By: Gretta Cool, BSN, RN, CWS, Kim RN, BSN Entered By: Gretta Cool, BSN, RN, CWS, Kim on 04/26/2017 14:51:20 [place], [name] ([id number]) -------------------------------------------------------------------------------- Encounter Discharge Information Details Patient Name: [name]. Date of Service: 04/26/2017 3:00 PM Medical Record Number: [id number] Patient Account Number: [id number] Date of Birth/Sex: [birth year] (46 y.o. Male) Treating RN: Cornell Barman Primary Care Ranya Fiddler: Einar Pheasant Other Clinician: Referring Dajana Gehrig: Einar Pheasant Treating Arelis Neumeier/Extender: Frann Rider in Treatment: 6 Encounter Discharge Information Items Discharge Pain Level: 0 Discharge Condition: Stable Ambulatory Status: Wheelchair Discharge Destination: Home Transportation:  Private Auto Accompanied By: self Schedule Follow-up Appointment: Yes Medication Reconciliation completed Yes and provided to Patient/Care Triniti Gruetzmacher: Patient Clinical Summary of Care: Declined Electronic Signature(s) Signed: 04/26/2017 5:06:56 PM By: Gretta Cool, BSN, RN, CWS, Kim RN, BSN Previous Signature: 04/26/2017 3:21:31 PM Version By: Ruthine Dose Entered By: Gretta Cool BSN, RN, CWS, Kim on 04/26/2017 17:06:56 [place], [name] ([id number]) -------------------------------------------------------------------------------- Lower Extremity Assessment Details Patient Name: [name]. Date of Service: 04/26/2017 3:00 PM Medical Record Number: [id number] Patient Account Number: [id number] Date of Birth/Sex: May 26, 1971 (46 y.o. Male) Treating RN: Cornell Barman Primary Care Edis Huish: Einar Pheasant Other Clinician: Referring Syd Manges: Einar Pheasant Treating Jajaira Ruis/Extender: Frann Rider in Treatment: 6 Vascular Assessment Pulses: Dorsalis Pedis Palpable: [Left:Yes] Posterior Tibial Palpable: [Left:Yes] Extremity colors, hair growth, and conditions: Extremity Color: [Left:Normal] Hair Growth on Extremity: [Left:Yes] Temperature of Extremity: [Left:Cold] Capillary Refill: [Left:< 3 seconds] Toe Nail Assessment Left: Right: Thick: No Discolored: No Deformed: No Improper Length and Hygiene: No Electronic Signature(s) Signed: 04/27/2017 8:31:20 AM By: Gretta Cool, BSN, RN, CWS, Kim RN, BSN Entered By: Gretta Cool, BSN, RN, CWS, Kim on 04/26/2017 14:57:51 [place], [name] ([id number]) -------------------------------------------------------------------------------- Multi Wound Chart Details Patient Name: [name]. Date of Service: 04/26/2017 3:00 PM Medical Record Number: [id number] Patient Account Number: [id number] Date of Birth/Sex: [birth year] (46 y.o. 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] Comorbid History: [4:N/A] [5:Type I Diabetes, History of N/A pressure wounds, Paraplegia] Date Acquired: [4:10/21/2016] [5:03/15/2017] [N/A:N/A] Weeks of Treatment: [4:6] [5:5] [N/A:N/A] Wound Status: [4:Open] [5:Open] [N/A:N/A] Measurements L x W x D 1.5x1.6x0.2 [5:0.2x0.4x0.2] [N/A:N/A] (cm) Area (cm) : [4:1.885] [5:0.063] [N/A:N/A]  Volume (cm) : [4:0.377] [5:0.013] [N/A:N/A] % Reduction in Area: [4:68.00%] [5:90.00%] [N/A:N/A] % Reduction in Volume: 68.00% [5:79.40%] [N/A:N/A] Classification: [4:Full Thickness Without Exposed Support Structures] [5:Grade 1] [N/A:N/A] Exudate Amount: [4:N/A] [5:None Present] [N/A:N/A] Wound Margin: [4:N/A] [5:Flat and Intact] [N/A:N/A] Granulation Amount: [4:N/A] [5:Small (1-33%)] [N/A:N/A] Necrotic Amount: [4:N/A] [5:Large (67-100%)] [N/A:N/A] Necrotic Tissue: [4:N/A] [5:Eschar] [N/A:N/A] Epithelialization: [4:N/A] [5:None] [N/A:N/A] Debridement: [4:N/A N/A] [5:Debridement ([id number]) 15:06] [N/A:N/A N/A] Pre-procedure Verification/Time Out Taken: Pain Control: N/A Other N/A Tissue Debrided: N/A Skin, Subcutaneous N/A Level: N/A Skin/Subcutaneous N/A Tissue Debridement Area (sq N/A 0.96 N/A cm): Instrument: N/A Forceps, Scissors N/A Bleeding: N/A Minimum N/A Hemostasis Achieved: N/A Pressure N/A Procedural Pain: N/A 0 N/A Post Procedural Pain: N/A 0 N/A Debridement Treatment N/A Procedure was tolerated N/A Response:  well Post Debridement N/A 0.8x1.2x0.2 N/A Measurements L x W x D (cm) Post Debridement N/A 0.151 N/A Volume: (cm) Periwound Skin Texture: No Abnormalities Noted Excoriation: No N/A Induration: No Callus: No Crepitus: No Rash: No Scarring: No Periwound Skin No Abnormalities Noted Maceration: No N/A Moisture: Dry/Scaly: No Periwound Skin Color: No Abnormalities Noted Ecchymosis: Yes N/A Atrophie Blanche: No Cyanosis: No Erythema: No Hemosiderin Staining: No Mottled: No Pallor: No Rubor: No Tenderness on No No N/A Palpation: Wound Preparation: N/A Ulcer Cleansing: N/A Rinsed/Irrigated with Saline Topical Anesthetic Applied: Other: lidocaine 4% Procedures Performed: N/A Debridement N/A Treatment Notes SEBASTIEN, JACKSON ([id number]) Electronic Signature(s) Signed: 04/26/2017 3:37:39 PM By: Christin Fudge MD, FACS Entered By: Christin Fudge on 04/26/2017 15:37:39 [place], [name] ([id number]) -------------------------------------------------------------------------------- [place] Details Patient Name: [name]. Date of Service: 04/26/2017 3:00 PM Medical Record Number: [id number] Patient Account Number: [id number] Date of Birth/Sex: [birth year] (46 y.o. Male) Treating RN: Cornell Barman Primary Care Baron Parmelee: Einar Pheasant Other Clinician: Referring Jatavion Peaster: Einar Pheasant Treating Jaymarion Trombly/Extender: Frann Rider in Treatment: 6 Active Inactive ` Orientation to the Wound Care Program Nursing Diagnoses: Knowledge deficit related to the wound healing center program Goals: Patient/caregiver will verbalize understanding of the [facility] Program Date Initiated: 03/12/2017 Target Resolution Date: 07/12/2017 Goal Status: Active Interventions: Provide education on orientation to the wound center Notes: ` Wound/Skin Impairment Nursing Diagnoses: Impaired tissue integrity Knowledge deficit related to ulceration/compromised skin  integrity Goals: Patient/caregiver will verbalize understanding of skin care regimen Date Initiated: 03/12/2017 Target Resolution Date: 07/12/2017 Goal Status: Active Ulcer/skin breakdown will have a volume reduction of 30% by week 4 Date Initiated: 03/12/2017 Target Resolution Date: 07/12/2017 Goal Status: Active Ulcer/skin breakdown will have a volume reduction of 50% by week 8 Date Initiated: 03/12/2017 Target Resolution Date: 07/12/2017 Goal Status: Active Ulcer/skin breakdown will have a volume reduction of 80% by week 12 Date Initiated: 03/12/2017 Target Resolution Date: 07/12/2017 Goal Status: Active Ulcer/skin breakdown will heal within 14 weeks BLAYDE, BACIGALUPI ([id number]) Date Initiated: 03/12/2017 Target Resolution Date: 07/12/2017 Goal Status: Active Interventions: Assess patient/caregiver ability to obtain necessary supplies Assess patient/caregiver ability to perform ulcer/skin care regimen upon admission and as needed Assess ulceration(s) every visit Provide education on ulcer and skin care Treatment Activities: Referred to DME Mignonne Afonso for dressing supplies : 03/12/2017 Skin care regimen initiated : 03/12/2017 Topical wound management initiated : 03/12/2017 Notes: Electronic Signature(s) Signed: 04/27/2017 8:31:20 AM By: Gretta Cool, BSN, RN, CWS, Kim RN, BSN Entered By: Gretta Cool, BSN, RN, CWS, Kim on 04/26/2017 14:59:40 [place], [name] ([id number]) -------------------------------------------------------------------------------- Pain Assessment Details Patient Name: [name]. Date of Service: 04/26/2017 3:00 PM Medical Record Number: [id number] Patient Account Number: [id number] Date of Birth/Sex:  07-06-1971 (46 y.o. Male) Treating RN: Cornell Barman Primary Care Chonda [name]: Einar Pheasant Other Clinician: Referring Mackynzie Woolford: Einar Pheasant Treating Rollo Farquhar/Extender: Frann Rider in Treatment: 6 Active Problems Location of Pain Severity and Description of Pain Patient Has Paino  No Site Locations With Dressing Change: No Pain Management and Medication Current Pain Management: Electronic Signature(s) Signed: 04/27/2017 8:31:20 AM By: Gretta Cool, BSN, RN, CWS, Kim RN, BSN Entered By: Gretta Cool, BSN, RN, CWS, Kim on 04/26/2017 14:51:45 [place], [name] ([id number]) -------------------------------------------------------------------------------- Patient/Caregiver Education Details Patient Name: [name]. Date of Service: 04/26/2017 3:00 PM Medical Record Number: [id number] Patient Account Number: [id number] Date of Birth/Gender: Jul 08, 1971 (46 y.o. Male) Treating RN: Cornell Barman Primary Care Physician: Einar Pheasant Other Clinician: Referring Physician: Einar Pheasant Treating Physician/Extender: Frann Rider in Treatment: 6 Education Assessment Education Provided To: Patient Education Topics Provided Wound/Skin Impairment: Handouts: Caring for Your Ulcer Methods: Demonstration, Explain/Verbal Responses: State content correctly Electronic Signature(s) Signed: 04/27/2017 8:31:20 AM By: Gretta Cool, BSN, RN, CWS, Kim RN, BSN Entered By: Gretta Cool, BSN, RN, CWS, Kim on 04/26/2017 17:07:06 [place], [name] ([id number]) -------------------------------------------------------------------------------- Wound Assessment Details Patient Name: [name]. Date of Service: 04/26/2017 3:00 PM Medical Record Number: [id number] Patient Account Number: [id number] Date of Birth/Sex: Jan 28, 1971 (46 y.o. Male) Treating RN: Cornell Barman Primary Care Madisan Bice: Einar Pheasant Other Clinician: Referring Sitlaly Gudiel: Einar Pheasant Treating Marleen Moret/Extender: Frann Rider in Treatment: 6 Wound Status Wound Number: 4 Primary Etiology: Open Surgical Wound Wound Location: Medial Sacrum Wound Status: Open Wounding Event: Surgical Injury Date Acquired: 10/21/2016 Weeks Of Treatment: 6 Clustered Wound: No Photos Photo Uploaded By: Gretta Cool, BSN, RN, CWS, Kim on 04/27/2017  08:32:11 Wound Measurements Length: (cm) 1.5 Width: (cm) 1.6 Depth: (cm) 0.2 Area: (cm) 1.885 Volume: (cm) 0.377 % Reduction in Area: 68% % Reduction in Volume: 68% Wound Description Full Thickness Without Exposed Classification: Support Structures Periwound Skin Texture Texture Color No Abnormalities Noted: No No Abnormalities Noted: No Moisture No Abnormalities Noted: No Treatment Notes Wound #4 (Medial Sacrum) 1. Cleansed with: Clean wound with Normal Saline Fildes, [name] ([id number]) 2. Anesthetic Topical Lidocaine 4% cream to wound bed prior to debridement 4. Dressing Applied: Other dressing (specify in notes) Notes Sacrum: Sorbact, drawtex and BFD; Heel: Prism and BFD Electronic Signature(s) Signed: 04/27/2017 8:31:20 AM By: Gretta Cool, BSN, RN, CWS, Kim RN, BSN Entered By: Gretta Cool, BSN, RN, CWS, Kim on 04/26/2017 14:56:50 [place], [name] ([id number]) -------------------------------------------------------------------------------- Wound Assessment Details Patient Name: [name]. Date of Service: 04/26/2017 3:00 PM Medical Record Number: [id number] Patient Account Number: [id number] Date of Birth/Sex: Feb 04, 1971 (46 y.o. Male) Treating RN: Cornell Barman Primary Care Mennie Spiller: Einar Pheasant Other Clinician: Referring Blaze Sandin: Einar Pheasant Treating Kymberly Blomberg/Extender: Frann Rider in Treatment: 6 Wound Status Wound Number: 5 Primary Diabetic Wound/Ulcer of the Lower Etiology: Extremity Wound Location: Left Calcaneus Wound Open Wounding Event: Pressure Injury Status: Date Acquired: 03/15/2017 Comorbid Type I Diabetes, History of pressure Weeks Of Treatment: 5 History: wounds, Paraplegia Clustered Wound: No Photos Photo Uploaded By: Gretta Cool, BSN, RN, CWS, Kim on 04/27/2017 08:32:39 Wound Measurements Length: (cm) 0.2 Width: (cm) 0.4 Depth: (cm) 0.2 Area: (cm) 0.063 Volume: (cm) 0.013 % Reduction in Area: 90% % Reduction in Volume:  79.4% Epithelialization: None Wound Description Classification: Grade 1 Foul Odor After Wound Margin: Flat and Intact Slough/Fibrino Exudate Amount: None Present Cleansing: No No Wound Bed Granulation Amount: Small (1-33%) Exposed Structure Necrotic Amount: Large (67-100%) Fascia Exposed: No Necrotic Quality: Eschar Fat Layer (Subcutaneous Tissue) Exposed: Yes Tendon Exposed:  No Muscle Exposed: No Joint Exposed: No Bone Exposed: No Periwound Skin Texture Leech, [name] ([id number]) Texture Color No Abnormalities Noted: No No Abnormalities Noted: No Callus: No Atrophie Blanche: No Crepitus: No Cyanosis: No Excoriation: No Ecchymosis: Yes Induration: No Erythema: No Rash: No Hemosiderin Staining: No Scarring: No Mottled: No Pallor: No Moisture Rubor: No No Abnormalities Noted: No Dry / Scaly: No Maceration: No Wound Preparation Ulcer Cleansing: Rinsed/Irrigated with Saline Topical Anesthetic Applied: Other: lidocaine 4%, Treatment Notes Wound #5 (Left Calcaneus) 1. Cleansed with: Clean wound with Normal Saline 2. Anesthetic Topical Lidocaine 4% cream to wound bed prior to debridement 4. Dressing Applied: Other dressing (specify in notes) Notes Sacrum: Sorbact, drawtex and BFD; Heel: Prism and BFD Electronic Signature(s) Signed: 04/27/2017 8:31:20 AM By: Gretta Cool, BSN, RN, CWS, Kim RN, BSN Entered By: Gretta Cool, BSN, RN, CWS, Kim on 04/26/2017 14:59:20 [place], [name] ([id number]) -------------------------------------------------------------------------------- Vitals Details Patient Name: [name]. Date of Service: 04/26/2017 3:00 PM Medical Record Number: [id number] Patient Account Number: [id number] Date of Birth/Sex: 27-May-1971 (46 y.o. Male) Treating RN: Cornell Barman Primary Care Alphonso Gregson: Einar Pheasant Other Clinician: Referring Arion Morgan: Einar Pheasant Treating Seryna Marek/Extender: Frann Rider in Treatment: 6 Vital Signs Time Taken:  14:51 Temperature (F): 98.1 Height (in): 72 Pulse (bpm): 67 Weight (lbs): 170 Respiratory Rate (breaths/min): 16 Body Mass Index (BMI): 23.1 Blood Pressure (mmHg): 106/63 Reference Range: 80 - 120 mg / dl Electronic Signature(s) Signed: 04/27/2017 8:31:20 AM By: Gretta Cool, BSN, RN, CWS, Kim RN, BSN Entered By: Gretta Cool, BSN, RN, CWS, Kim on 04/26/2017 14:52:06

## 2017-04-28 ENCOUNTER — Encounter: Payer: Self-pay | Admitting: Internal Medicine

## 2017-04-28 ENCOUNTER — Other Ambulatory Visit: Payer: Self-pay | Admitting: Internal Medicine

## 2017-04-28 MED ORDER — INSULIN LISPRO 100 UNIT/ML (KWIKPEN): PEN_INJECTOR | SUBCUTANEOUS | 11 refills | Status: DC
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
## 2017-04-30 ENCOUNTER — Other Ambulatory Visit (INDEPENDENT_AMBULATORY_CARE_PROVIDER_SITE_OTHER): Payer: Medicare Other

## 2017-04-30 DIAGNOSIS — E109 Type 1 diabetes mellitus without complications: Secondary | ICD-10-CM | POA: Diagnosis not present

## 2017-04-30 DIAGNOSIS — D649 Anemia, unspecified: Secondary | ICD-10-CM

## 2017-04-30 LAB — LIPID PANEL
CHOLESTEROL: 174 mg/dL (ref 0–200)
HDL: 83 mg/dL (ref >39.00)
LDL CALC: 77 mg/dL (ref 0–99)
NonHDL: 90.61
Total CHOL/HDL Ratio: 2
Triglycerides: 67 mg/dL (ref 0.0–149.0)
VLDL: 13.4 mg/dL (ref 0.0–40.0)

## 2017-04-30 LAB — BASIC METABOLIC PANEL
BUN: 11 mg/dL (ref 6–23)
CHLORIDE: 96 meq/L (ref 96–112)
CO2: 30 meq/L (ref 19–32)
Calcium: 9.9 mg/dL (ref 8.4–10.5)
Creatinine, Ser: 0.76 mg/dL (ref 0.40–1.50)
GFR: 116.79 mL/min (ref >60.00)
Glucose, Bld: 84 mg/dL (ref 70–99)
POTASSIUM: 4.8 meq/L (ref 3.5–5.1)
SODIUM: 132 meq/L — AB (ref 135–145)

## 2017-04-30 LAB — CBC WITH DIFFERENTIAL/PLATELET
BASOS PCT: 0.5 % (ref 0.0–3.0)
Basophils Absolute: 0 10*3/uL (ref 0.0–0.1)
Eosinophils Absolute: 0.1 10*3/uL (ref 0.0–0.7)
Eosinophils Relative: 0.7 % (ref 0.0–5.0)
HCT: 41.2 % (ref 39.0–52.0)
HEMOGLOBIN: 14.2 g/dL (ref 13.0–17.0)
Lymphocytes Relative: 18.3 % (ref 12.0–46.0)
Lymphs Abs: 1.4 10*3/uL (ref 0.7–4.0)
MCHC: 34.4 g/dL (ref 30.0–36.0)
MCV: 95.8 fl (ref 78.0–100.0)
MONO ABS: 0.6 10*3/uL (ref 0.1–1.0)
Monocytes Relative: 7.6 % (ref 3.0–12.0)
Neutro Abs: 5.6 10*3/uL (ref 1.4–7.7)
Neutrophils Relative %: 72.9 % (ref 43.0–77.0)
Platelets: 287 10*3/uL (ref 150.0–400.0)
RBC: 4.3 Mil/uL (ref 4.22–5.81)
RDW: 12.6 % (ref 11.5–15.5)
WBC: 7.7 10*3/uL (ref 4.0–10.5)

## 2017-04-30 LAB — HEPATIC FUNCTION PANEL
ALT: 19 U/L (ref 0–53)
AST: 23 U/L (ref 0–37)
Albumin: 4.4 g/dL (ref 3.5–5.2)
Alkaline Phosphatase: 99 U/L (ref 39–117)
BILIRUBIN TOTAL: 0.5 mg/dL (ref 0.2–1.2)
Bilirubin, Direct: 0.1 mg/dL (ref 0.0–0.3)
TOTAL PROTEIN: 7.3 g/dL (ref 6.0–8.3)

## 2017-04-30 LAB — TSH: TSH: 1.98 u[IU]/mL (ref 0.35–4.50)

## 2017-04-30 LAB — HEMOGLOBIN A1C: Hgb A1c MFr Bld: 7.3 % — ABNORMAL HIGH (ref 4.6–6.5)

## 2017-05-01 ENCOUNTER — Encounter: Payer: Self-pay | Admitting: Internal Medicine

## 2017-05-06 ENCOUNTER — Ambulatory Visit (INDEPENDENT_AMBULATORY_CARE_PROVIDER_SITE_OTHER): Payer: Medicare Other | Admitting: Internal Medicine

## 2017-05-06 ENCOUNTER — Encounter: Payer: Self-pay | Admitting: Internal Medicine

## 2017-05-06 VITALS — BP 140/80 | HR 61 | Temp 98.6°F

## 2017-05-06 DIAGNOSIS — G903 Multi-system degeneration of the autonomic nervous system: Secondary | ICD-10-CM | POA: Diagnosis not present

## 2017-05-06 DIAGNOSIS — L98491 Non-pressure chronic ulcer of skin of other sites limited to breakdown of skin: Secondary | ICD-10-CM

## 2017-05-06 DIAGNOSIS — E109 Type 1 diabetes mellitus without complications: Secondary | ICD-10-CM | POA: Diagnosis not present

## 2017-05-06 DIAGNOSIS — F1010 Alcohol abuse, uncomplicated: Secondary | ICD-10-CM

## 2017-05-06 DIAGNOSIS — M25511 Pain in right shoulder: Secondary | ICD-10-CM | POA: Diagnosis not present

## 2017-05-06 DIAGNOSIS — G8220 Paraplegia, unspecified: Secondary | ICD-10-CM
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
## 2017-05-07 ENCOUNTER — Encounter: Payer: Medicare Other | Attending: Surgery | Admitting: Surgery

## 2017-05-07 DIAGNOSIS — Y839 Surgical procedure, unspecified as the cause of abnormal reaction of the patient, or of later complication, without mention of misadventure at the time of the procedure: Secondary | ICD-10-CM | POA: Diagnosis not present

## 2017-05-07 DIAGNOSIS — Z888 Allergy status to other drugs, medicaments and biological substances status: Secondary | ICD-10-CM | POA: Diagnosis not present

## 2017-05-07 DIAGNOSIS — Z79899 Other long term (current) drug therapy: Secondary | ICD-10-CM | POA: Insufficient documentation

## 2017-05-07 DIAGNOSIS — G8929 Other chronic pain: Secondary | ICD-10-CM | POA: Diagnosis not present

## 2017-05-07 DIAGNOSIS — G8254 Quadriplegia, C5-C7 incomplete: Secondary | ICD-10-CM | POA: Insufficient documentation

## 2017-05-07 DIAGNOSIS — Z91041 Radiographic dye allergy status: Secondary | ICD-10-CM | POA: Diagnosis not present

## 2017-05-07 DIAGNOSIS — I10 Essential (primary) hypertension: Secondary | ICD-10-CM | POA: Diagnosis not present

## 2017-05-07 DIAGNOSIS — Z882 Allergy status to sulfonamides status: Secondary | ICD-10-CM | POA: Diagnosis not present

## 2017-05-07 DIAGNOSIS — T8131XA Disruption of external operation (surgical) wound, not elsewhere classified, initial encounter: Secondary | ICD-10-CM | POA: Diagnosis not present

## 2017-05-07 DIAGNOSIS — L89622 Pressure ulcer of left heel, stage 2: Secondary | ICD-10-CM | POA: Diagnosis not present

## 2017-05-07 DIAGNOSIS — F329 Major depressive disorder, single episode, unspecified: Secondary | ICD-10-CM | POA: Insufficient documentation

## 2017-05-07 DIAGNOSIS — Z794 Long term (current) use of insulin: Secondary | ICD-10-CM | POA: Diagnosis not present

## 2017-05-07 DIAGNOSIS — E10622 Type 1 diabetes mellitus with other skin ulcer: Secondary | ICD-10-CM | POA: Diagnosis not present

## 2017-05-07 DIAGNOSIS — L8943 Pressure ulcer of contiguous site of back, buttock and hip, stage 3: Secondary | ICD-10-CM | POA: Insufficient documentation

## 2017-05-08 ENCOUNTER — Encounter: Payer: Self-pay | Admitting: Internal Medicine

## 2017-05-08 NOTE — Assessment & Plan Note (Signed)
Was having intermittent issues with low pressures.  See previous note for details.  Saw cardiology.  Blood pressure doing well now.  Follow.

## 2017-05-08 NOTE — Assessment & Plan Note (Signed)
Being followed by wound clinic.

## 2017-05-08 NOTE — Assessment & Plan Note (Signed)
Stable.  Follow.

## 2017-05-08 NOTE — Assessment & Plan Note (Signed)
Sugars as outlined.  Diet.  Follow met b and a1c.  a1c 7.3 recent check.

## 2017-05-08 NOTE — Assessment & Plan Note (Signed)
Discussed the need to decrease alcohol intake.  Follow.
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
## 2017-05-09 NOTE — Progress Notes (Signed)
ZACKAREY, HOLLEMAN ([id number]) Visit Report for 05/07/2017 Arrival Information Details Patient Name: ZYLEN, WENIG. Date of Service: 05/07/2017 2:45 PM Medical Record Number: [id number] Patient Account Number: [id number] Date of Birth/Sex: October 28, 1971 (46 y.o. Male) Treating RN: [name] Gouty, RN, BSN, Velva Harman Primary Care Katheryn Culliton: Einar Pheasant Other Clinician: Referring Redonna Wilbert: Einar Pheasant Treating Avynn Klassen/Extender: Frann Rider in Treatment: 8 Visit Information History Since Last Visit All ordered tests and consults were completed: No Patient Arrived: Wheel Chair Added or deleted any medications: No Arrival Time: 14:50 Any new allergies or adverse reactions: No Accompanied By: self Had a fall or experienced change in No activities of daily living that may affect Transfer Assistance: Manual risk of falls: Patient Identification Verified: Yes Signs or symptoms of abuse/neglect since last No Secondary Verification Process Yes visito Completed: Hospitalized since last visit: No Patient Requires Transmission-Based No Pain Present Now: No Precautions: Patient Has Alerts: No Electronic Signature(s) Signed: 05/07/2017 4:25:17 PM By: Regan Lemming BSN, RN Entered By: Regan Lemming on 05/07/2017 14:50:47 Allinson, [name] ([id number]) -------------------------------------------------------------------------------- Encounter Discharge Information Details Patient Name: [name]. Date of Service: 05/07/2017 2:45 PM Medical Record Number: [id number] Patient Account Number: [id number] Date of Birth/Sex: 09-Sep-1971 (46 y.o. Male) Treating RN: [name] Gouty, RN, BSN, Velva Harman Primary Care Brizza Nathanson: Einar Pheasant Other Clinician: Referring Dat Derksen: Einar Pheasant Treating Sefora Tietje/Extender: Frann Rider in Treatment: 8 Encounter Discharge Information Items Discharge Pain Level: 0 Discharge Condition: Stable Ambulatory Status: Wheelchair Discharge Destination: Home Transportation:  Other Accompanied By: self Schedule Follow-up Appointment: No Medication Reconciliation completed No and provided to Patient/Care Donyea Beverlin: Provided on Clinical Summary of Care: 05/07/2017 Form Type Recipient Paper Patient SR Electronic Signature(s) Signed: 05/07/2017 4:13:42 PM By: Regan Lemming BSN, RN Previous Signature: 05/07/2017 3:36:11 PM Version By: Ruthine Dose Entered By: Regan Lemming on 05/07/2017 16:13:42 Lewis, [name] ([id number]) -------------------------------------------------------------------------------- Lower Extremity Assessment Details Patient Name: [name]. Date of Service: 05/07/2017 2:45 PM Medical Record Number: [id number] Patient Account Number: [id number] Date of Birth/Sex: 07/06/1971 (46 y.o. Male) Treating RN: [name] Gouty, RN, BSN, Velva Harman Primary Care Niam Nepomuceno: Einar Pheasant Other Clinician: Referring Caulder Wehner: Einar Pheasant Treating Aaliayah Miao/Extender: Frann Rider in Treatment: 8 Vascular Assessment Pulses: Dorsalis Pedis Palpable: [Left:Yes] Posterior Tibial Extremity colors, hair growth, and conditions: Extremity Color: [Left:Normal] Hair Growth on Extremity: [Left:Yes] Temperature of Extremity: [Left:Warm] Capillary Refill: [Left:< 3 seconds] Toe Nail Assessment Left: Right: Thick: No Discolored: No Deformed: No Improper Length and Hygiene: No Electronic Signature(s) Signed: 05/07/2017 4:25:17 PM By: Regan Lemming BSN, RN Entered By: Regan Lemming on 05/07/2017 14:53:05 Lamora, [name] ([id number]) -------------------------------------------------------------------------------- Multi Wound Chart Details Patient Name: [name]. Date of Service: 05/07/2017 2:45 PM Medical Record Number: [id number] Patient Account Number: [id number] Date of Birth/Sex: Jan 20, 1971 (46 y.o. Male) Treating RN: [name] Gouty, RN, BSN, Velva Harman Primary Care Denika Krone: Einar Pheasant Other Clinician: Referring Ladislav Caselli: Einar Pheasant Treating Evadene Wardrip/Extender:  Frann Rider in Treatment: 8 Vital Signs Height(in): 72 Pulse(bpm): 68 Weight(lbs): 170 Blood Pressure 142/74 (mmHg): Body Mass Index(BMI): 23 Temperature(F): 97.8 Respiratory Rate 17 (breaths/min): Photos: [4:No Photos] [5:No Photos] [N/A:N/A] Wound Location: [4:Sacrum - Medial] [5:Left Calcaneus] [N/A:N/A] Wounding Event: [4:Surgical Injury] [5:Pressure Injury] [N/A:N/A] Primary Etiology: [4:Open Surgical Wound] [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] Comorbid History: [4:Type I Diabetes, History of Type I Diabetes, History of N/A pressure wounds, Paraplegia] [5:pressure wounds, Paraplegia] Date Acquired: [4:10/21/2016] [5:03/15/2017] [N/A:N/A] Weeks of Treatment: [4:8] [5:7] [N/A:N/A] Wound Status: [4:Open] [5:Open] [N/A:N/A] Measurements L x W x D 1.5x2x0.2 [5:0.7x1x0.2] [N/A:N/A] (cm) Area (cm) : [  4:2.356] [5:0.55] [N/A:N/A] Volume (cm) : [4:0.471] [5:0.11] [N/A:N/A] % Reduction in Area: [4:60.00%] [5:12.40%] [N/A:N/A] % Reduction in Volume: 60.00% [5:-74.60%] [N/A:N/A] Classification: [4:Full Thickness Without Exposed Support Structures] [5:Grade 1] [N/A:N/A] Exudate Amount: [4:Medium] [5:Medium] [N/A:N/A] Exudate Type: [4:Serosanguineous] [5:Serosanguineous] [N/A:N/A] Exudate Color: [4:red, brown] [5:red, brown] [N/A:N/A] Wound Margin: [4:Distinct, outline attached Flat and Intact] [N/A:N/A] Granulation Amount: [4:Large (67-100%)] [5:Small (1-33%)] [N/A:N/A] Granulation Quality: [4:Pink, Pale] [5:Pink] [N/A:N/A] Necrotic Amount: [4:None Present (0%)] [5:Large (67-100%)] [N/A:N/A] Exposed Structures: [4:Fat Layer (Subcutaneous Fat Layer (Subcutaneous N/A Tissue) Exposed: Yes Fascia: No] [5:Tissue) Exposed: Yes Fascia: No] Tendon: No Tendon: No Muscle: No Muscle: No Joint: No Joint: No Bone: No Bone: No Epithelialization: None None N/A Debridement: N/A Debridement ([id number]) Pre-procedure N/A 15:12 N/A Verification/Time  Out Taken: Pain Control: N/A Lidocaine 4% Topical N/A Solution Tissue Debrided: N/A Fibrin/Slough, N/A Subcutaneous Level: N/A Skin/Subcutaneous N/A Tissue Debridement Area (sq N/A 0.7 N/A cm): Instrument: N/A Curette N/A Bleeding: N/A Minimum N/A Hemostasis Achieved: N/A Pressure N/A Procedural Pain: N/A 0 N/A Post Procedural Pain: N/A 0 N/A Debridement Treatment N/A Procedure was tolerated N/A Response: well Post Debridement N/A 0.9x1x0.2 N/A Measurements L x W x D (cm) Post Debridement N/A 0.141 N/A Volume: (cm) Periwound Skin Texture: Scarring: Yes Excoriation: No N/A Excoriation: No Induration: No Induration: No Callus: No Callus: No Crepitus: No Crepitus: No Rash: No Rash: No Scarring: No Periwound Skin Maceration: Yes Maceration: No N/A Moisture: Dry/Scaly: No Dry/Scaly: No Periwound Skin Color: Atrophie Blanche: No Ecchymosis: Yes N/A Cyanosis: No Atrophie Blanche: No Ecchymosis: No Cyanosis: No Erythema: No Erythema: No Hemosiderin Staining: No Hemosiderin Staining: No Mottled: No Mottled: No Pallor: No Pallor: No Rubor: No Rubor: No Temperature: No Abnormality No Abnormality N/A Tenderness on No No N/A Palpation: Wound Preparation: N/A DAVONE, SHINAULT ([id number]) Ulcer Cleansing: Ulcer Cleansing: Rinsed/Irrigated with Rinsed/Irrigated with Saline Saline Topical Anesthetic Topical Anesthetic Applied: None Applied: Other: lidocaine 4% Procedures Performed: N/A Debridement N/A Treatment Notes Electronic Signature(s) Signed: 05/07/2017 3:41:52 PM By: Christin Fudge MD, FACS Entered By: Christin Fudge on 05/07/2017 15:41:52 Hohler, [name] ([id number]) -------------------------------------------------------------------------------- [place] Details Patient Name: [name]. Date of Service: 05/07/2017 2:45 PM Medical Record Number: [id number] Patient Account Number: [id number] Date of Birth/Sex: Jan 20, 1971 (46 y.o.  Male) Treating RN: [name] Gouty, RN, BSN, Velva Harman Primary Care Cynitha Berte: Einar Pheasant Other Clinician: Referring Darreon Lutes: Einar Pheasant Treating Shanti Eichel/Extender: Frann Rider in Treatment: 8 Active Inactive ` Orientation to the Wound Care Program Nursing Diagnoses: Knowledge deficit related to the wound healing center program Goals: Patient/caregiver will verbalize understanding of the [place] Program Date Initiated: 03/12/2017 Target Resolution Date: 07/12/2017 Goal Status: Active Interventions: Provide education on orientation to the wound center Notes: ` Wound/Skin Impairment Nursing Diagnoses: Impaired tissue integrity Knowledge deficit related to ulceration/compromised skin integrity Goals: Patient/caregiver will verbalize understanding of skin care regimen Date Initiated: 03/12/2017 Target Resolution Date: 07/12/2017 Goal Status: Active Ulcer/skin breakdown will have a volume reduction of 30% by week 4 Date Initiated: 03/12/2017 Target Resolution Date: 07/12/2017 Goal Status: Active Ulcer/skin breakdown will have a volume reduction of 50% by week 8 Date Initiated: 03/12/2017 Target Resolution Date: 07/12/2017 Goal Status: Active Ulcer/skin breakdown will have a volume reduction of 80% by week 12 Date Initiated: 03/12/2017 Target Resolution Date: 07/12/2017 Goal Status: Active Ulcer/skin breakdown will heal within 14 weeks JLYNN, LANGILLE ([id number]) Date Initiated: 03/12/2017 Target Resolution Date: 07/12/2017 Goal Status: Active Interventions: Assess patient/caregiver ability to obtain necessary supplies Assess patient/caregiver ability to  perform ulcer/skin care regimen upon admission and as needed Assess ulceration(s) every visit Provide education on ulcer and skin care Treatment Activities: Referred to DME Nataliah Hatlestad for dressing supplies : 03/12/2017 Skin care regimen initiated : 03/12/2017 Topical wound management initiated : 03/12/2017 Notes: Electronic  Signature(s) Signed: 05/07/2017 4:25:17 PM By: Regan Lemming BSN, RN Entered By: Regan Lemming on 05/07/2017 15:12:24 Tomasetti, [name] ([id number]) -------------------------------------------------------------------------------- Pain Assessment Details Patient Name: [name]. Date of Service: 05/07/2017 2:45 PM Medical Record Number: [id number] Patient Account Number: [id number] Date of Birth/Sex: 1971/11/02 (46 y.o. Male) Treating RN: [name] Gouty, RN, BSN, Velva Harman Primary Care Ronica Vivian: Einar Pheasant Other Clinician: Referring Regena Delucchi: Einar Pheasant Treating Taliah Porche/Extender: Frann Rider in Treatment: 8 Active Problems Location of Pain Severity and Description of Pain Patient Has Paino No Site Locations With Dressing Change: No Pain Management and Medication Current Pain Management: Electronic Signature(s) Signed: 05/07/2017 4:25:17 PM By: Regan Lemming BSN, RN Entered By: Regan Lemming on 05/07/2017 14:50:54 Heslop, [name] ([id number]) -------------------------------------------------------------------------------- Patient/Caregiver Education Details Patient Name: [name]. Date of Service: 05/07/2017 2:45 PM Medical Record Number: [id number] Patient Account Number: [id number] Date of Birth/Gender: 02/03/1971 (46 y.o. Male) Treating RN: [name] Gouty, RN, BSN, Velva Harman Primary Care Physician: Einar Pheasant Other Clinician: Referring Physician: Einar Pheasant Treating Physician/Extender: Frann Rider in Treatment: 8 Education Assessment Education Provided To: Patient Education Topics Provided Welcome To [facility]: Methods: Explain/Verbal Responses: State content correctly Wound/Skin Impairment: Methods: Explain/Verbal Responses: State content correctly Electronic Signature(s) Signed: 05/07/2017 4:25:17 PM By: Regan Lemming BSN, RN Entered By: Regan Lemming on 05/07/2017 16:13:55 Davidian, [name]  ([id number]) -------------------------------------------------------------------------------- Wound Assessment Details Patient Name: [name]. Date of Service: 05/07/2017 2:45 PM Medical Record Number: [id number] Patient Account Number: [id number] Date of Birth/Sex: June 13, 1971 (46 y.o. Male) Treating RN: [name] Gouty, RN, BSN, Velva Harman Primary Care Dago Jungwirth: Einar Pheasant Other Clinician: Referring Sheronda Parran: Einar Pheasant Treating Anaiya Wisinski/Extender: Frann Rider in Treatment: 8 Wound Status Wound Number: 4 Primary Open Surgical Wound Etiology: Wound Location: Sacrum - Medial Wound Open Wounding Event: Surgical Injury Status: Date Acquired: 10/21/2016 Comorbid Type I Diabetes, History of pressure Weeks Of Treatment: 8 History: wounds, Paraplegia Clustered Wound: No Photos Photo Uploaded By: Regan Lemming on 05/07/2017 16:22:19 Wound Measurements Length: (cm) 1.5 Width: (cm) 2 Depth: (cm) 0.2 Area: (cm) 2.356 Volume: (cm) 0.471 % Reduction in Area: 60% % Reduction in Volume: 60% Epithelialization: None Tunneling: No Undermining: No Wound Description Full Thickness Without Exposed Classification: Support Structures Wound Margin: Distinct, outline attached Exudate Medium Amount: Exudate Type: Serosanguineous Exudate Color: red, brown Foul Odor After Cleansing: No Slough/Fibrino No Wound Bed Granulation Amount: Large (67-100%) Exposed Structure Granulation Quality: Pink, Pale Fascia Exposed: No Necrotic Amount: None Present (0%) Fat Layer (Subcutaneous Tissue) Exposed: Yes Balthaser, [name] ([id number]) Tendon Exposed: No Muscle Exposed: No Joint Exposed: No Bone Exposed: No Periwound Skin Texture Texture Color No Abnormalities Noted: No No Abnormalities Noted: No Callus: No Atrophie Blanche: No Crepitus: No Cyanosis: No Excoriation: No Ecchymosis: No Induration: No Erythema: No Rash: No Hemosiderin Staining: No Scarring: Yes Mottled:  No Pallor: No Moisture Rubor: No No Abnormalities Noted: No Dry / Scaly: No Temperature / Pain Maceration: Yes Temperature: No Abnormality Wound Preparation Ulcer Cleansing: Rinsed/Irrigated with Saline Topical Anesthetic Applied: None Treatment Notes Wound #4 (Medial Sacrum) 1. Cleansed with: Clean wound with Normal Saline 2. Anesthetic Topical Lidocaine 4% cream to wound bed prior to debridement 4. Dressing Applied: Other dressing (specify in notes) Notes Sacrum: Sorbact, drawtex  and BFD; Heel: Prism and BFD Electronic Signature(s) Signed: 05/07/2017 4:25:17 PM By: Regan Lemming BSN, RN Entered By: Regan Lemming on 05/07/2017 15:11:13 Kross, [name] ([id number]) -------------------------------------------------------------------------------- Wound Assessment Details Patient Name: [name]. Date of Service: 05/07/2017 2:45 PM Medical Record Number: [id number] Patient Account Number: [id number] Date of Birth/Sex: 1971-10-30 (46 y.o. Male) Treating RN: [name] Gouty, RN, BSN, [place] Primary Care Tasman Zapata: Einar Pheasant Other Clinician: Referring Lonnetta Kniskern: Einar Pheasant Treating Chavez Rosol/Extender: Frann Rider in Treatment: 8 Wound Status Wound Number: 5 Primary Diabetic Wound/Ulcer of the Lower Etiology: Extremity Wound Location: Left Calcaneus Wound Open Wounding Event: Pressure Injury Status: Date Acquired: 03/15/2017 Comorbid Type I Diabetes, History of pressure Weeks Of Treatment: 7 History: wounds, Paraplegia Clustered Wound: No Photos Photo Uploaded By: Regan Lemming on 05/07/2017 16:22:39 Wound Measurements Length: (cm) 0.7 Width: (cm) 1 Depth: (cm) 0.2 Area: (cm) 0.55 Volume: (cm) 0.11 % Reduction in Area: 12.4% % Reduction in Volume: -74.6% Epithelialization: None Tunneling: No Undermining: No Wound Description Classification: Grade 1 Wound Margin: Flat and Intact Exudate Amount: Medium Exudate Type: Serosanguineous Exudate Color: red,  brown Purifoy, [name] ([id number]) Foul Odor After Cleansing: No Slough/Fibrino Yes Wound Bed Granulation Amount: Small (1-33%) Exposed Structure Granulation Quality: Pink Fascia Exposed: No Necrotic Amount: Large (67-100%) Fat Layer (Subcutaneous Tissue) Exposed: Yes Necrotic Quality: Adherent Slough Tendon Exposed: No Muscle Exposed: No Joint Exposed: No Bone Exposed: No Periwound Skin Texture Texture Color No Abnormalities Noted: No No Abnormalities Noted: No Callus: No Atrophie Blanche: No Crepitus: No Cyanosis: No Excoriation: No Ecchymosis: Yes Induration: No Erythema: No Rash: No Hemosiderin Staining: No Scarring: No Mottled: No Pallor: No Moisture Rubor: No No Abnormalities Noted: No Dry / Scaly: No Temperature / Pain Maceration: No Temperature: No Abnormality Wound Preparation Ulcer Cleansing: Rinsed/Irrigated with Saline Topical Anesthetic Applied: Other: lidocaine 4%, Treatment Notes Wound #5 (Left Calcaneus) 1. Cleansed with: Clean wound with Normal Saline 2. Anesthetic Topical Lidocaine 4% cream to wound bed prior to debridement 4. Dressing Applied: Other dressing (specify in notes) Notes Sacrum: Sorbact, drawtex and BFD; Heel: Prism and BFD Electronic Signature(s) Signed: 05/07/2017 4:25:17 PM By: Regan Lemming BSN, RN Entered By: Regan Lemming on 05/07/2017 15:12:01 Evilsizer, [name] ([id number]) -------------------------------------------------------------------------------- Vitals Details Patient Name: [name]. Date of Service: 05/07/2017 2:45 PM Medical Record Number: [id number] Patient Account Number: [id number] Date of Birth/Sex: 12/16/1970 (46 y.o. Male) Treating RN: Afful, RN, BSN, [place] Primary Care Mercer Peifer: Einar Pheasant Other Clinician: Referring Zakeria Kulzer: Einar Pheasant Treating Keili Hasten/Extender: Frann Rider in Treatment: 8 Vital Signs Time Taken: 14:50 Temperature (F): 97.8 Height (in): 72 Pulse (bpm):  68 Weight (lbs): 170 Respiratory Rate (breaths/min): 17 Body Mass Index (BMI): 23.1 Blood Pressure (mmHg): 142/74 Reference Range: 80 - 120 mg / dl Electronic Signature(s) Signed: 05/07/2017 4:25:17 PM By: Regan Lemming BSN, RN Entered By: Regan Lemming on 05/07/2017 14:51:42

## 2017-05-17 ENCOUNTER — Encounter: Payer: Medicare Other | Admitting: Surgery

## 2017-05-17 DIAGNOSIS — E10622 Type 1 diabetes mellitus with other skin ulcer: Secondary | ICD-10-CM | POA: Diagnosis not present

## 2017-05-18 NOTE — Progress Notes (Signed)
CEM, KOSMAN ([id number]) Visit Report for 05/17/2017 Arrival Information Details Patient Name: [name], [name]. Date of Service: 05/17/2017 3:00 PM Medical Record Number: [id number] Patient Account Number: [id number] Date of Birth/Sex: 20-Feb-1971 (46 y.o. Male) Treating RN: Cornell Barman Primary Care Rage Beever: Einar Pheasant Other Clinician: Referring Kaisy Severino: Einar Pheasant Treating Kendyl Festa/Extender: Frann Rider in Treatment: 9 Visit Information History Since Last Visit Added or deleted any medications: No Patient Arrived: Wheel Chair Any new allergies or adverse reactions: No Arrival Time: 14:58 Had a fall or experienced change in No activities of daily living that may affect Accompanied By: self risk of falls: Transfer Assistance: None Signs or symptoms of abuse/neglect since last No Patient Identification Verified: Yes visito Secondary Verification Process Yes Hospitalized since last visit: No Completed: Has Dressing in Place as Prescribed: Yes Patient Requires Transmission-Based No Pain Present Now: No Precautions: Patient Has Alerts: No Electronic Signature(s) Signed: 05/17/2017 5:10:09 PM By: Gretta Cool, BSN, RN, CWS, Kim RN, BSN Entered By: Gretta Cool, BSN, RN, CWS, Kim on 05/17/2017 14:58:40 [place], [name] ([id number]) -------------------------------------------------------------------------------- Encounter Discharge Information Details Patient Name: [name] Girt. Date of Service: 05/17/2017 3:00 PM Medical Record Number: [id number] Patient Account Number: [id number] Date of Birth/Sex: 1971-10-06 (46 y.o. Male) Treating RN: Cornell Barman Primary Care Lux Skilton: Einar Pheasant Other Clinician: Referring Cathline Dowen: Einar Pheasant Treating Mialynn Shelvin/Extender: Frann Rider in Treatment: 9 Encounter Discharge Information Items Discharge Pain Level: 0 Discharge Condition: Stable Ambulatory Status: Wheelchair Discharge Destination: Home Transportation:  Other Accompanied By: self Schedule Follow-up Appointment: Yes Medication Reconciliation completed Yes and provided to Patient/Care Kiernan Atkerson: Patient Clinical Summary of Care: Declined Electronic Signature(s) Signed: 05/17/2017 3:41:25 PM By: Gretta Cool, BSN, RN, CWS, Kim RN, BSN Previous Signature: 05/17/2017 3:36:20 PM Version By: Ruthine Dose Entered By: Gretta Cool BSN, RN, CWS, Kim on 05/17/2017 15:41:25 [place], [name] ([id number]) -------------------------------------------------------------------------------- Lower Extremity Assessment Details Patient Name: [name] Girt. Date of Service: 05/17/2017 3:00 PM Medical Record Number: [id number] Patient Account Number: [id number] Date of Birth/Sex: [birth year] (46 y.o. Male) Treating RN: Cornell Barman Primary Care Jessee Newnam: Einar Pheasant Other Clinician: Referring Acire Tang: Einar Pheasant Treating Danitza Schoenfeldt/Extender: Frann Rider in Treatment: 9 Vascular Assessment Pulses: Dorsalis Pedis Palpable: [Left:Yes] Posterior Tibial Extremity colors, hair growth, and conditions: Extremity Color: [Left:Pale] Hair Growth on Extremity: [Left:No] Temperature of Extremity: [Left:Cold] Capillary Refill: [Left:> 3 seconds] Dependent Rubor: [Left:No] Blanched when Elevated: [Left:No] Lipodermatosclerosis: [Left:No] Toe Nail Assessment Left: Right: Thick: No Discolored: No Deformed: No Improper Length and Hygiene: No Electronic Signature(s) Signed: 05/17/2017 5:10:09 PM By: Gretta Cool, BSN, RN, CWS, Kim RN, BSN Entered By: Gretta Cool, BSN, RN, CWS, Kim on 05/17/2017 15:11:43 [place], [name] ([id number]) -------------------------------------------------------------------------------- Multi Wound Chart Details Patient Name: [name] Girt. Date of Service: 05/17/2017 3:00 PM Medical Record Number: [id number] Patient Account Number: [id number] Date of Birth/Sex: 01/25/1971 (46 y.o. 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] Comorbid History: [4:Type I Diabetes, History of Type I Diabetes, History of N/A pressure wounds, Paraplegia] [5:pressure wounds, Paraplegia] Date Acquired: [4:10/21/2016] [5:03/15/2017] [N/A:N/A] Weeks of Treatment: [4:9] [5:8] [N/A:N/A] Wound Status: [4:Open] [5:Open] [N/A:N/A] Measurements L  x W x D 1.1x1.5x0.1 [5:0.5x1x0.2] [N/A:N/A] (cm) Area (cm) : [4:1.296] [5:0.393] [N/A:N/A] Volume (cm) : [4:0.13] [5:0.079] [N/A:N/A] % Reduction in Area: [4:78.00%] [5:37.40%] [N/A:N/A] % Reduction in Volume: 89.00% [5:-25.40%] [N/A:N/A] Classification: [4:Full Thickness Without Exposed Support Structures] [5:Grade 1] [N/A:N/A] Exudate Amount: [4:Medium] [5:Medium] [N/A:N/A] Exudate Type: [4:Serosanguineous] [5:Serosanguineous] [N/A:N/A] Exudate Color: [4:red, brown] [5:red, brown] [N/A:N/A] Wound Margin: [4:Distinct, outline attached Flat and Intact] [N/A:N/A] Granulation Amount: [4:Large (67-100%)] [5:Small (1-33%)] [N/A:N/A] Granulation Quality: [4:Pink, Pale] [5:Pink] [N/A:N/A] Necrotic Amount: [4:None Present (0%)] [5:Large (67-100%)] [N/A:N/A] Exposed Structures: [4:Fat Layer (Subcutaneous Fat Layer (Subcutaneous N/A Tissue) Exposed: Yes Fascia: No] [5:Tissue) Exposed: Yes Fascia: No] Tendon: No Tendon: No Muscle: No Muscle: No Joint: No Joint: No Bone: No Bone: No Epithelialization: None None N/A Debridement:  N/A Debridement ([id number] N/A [id number]) Pre-procedure N/A 03:10 N/A Verification/Time Out Taken: Pain Control: N/A Other N/A Tissue Debrided: N/A Fibrin/Slough, N/A Subcutaneous Level: N/A Skin/Subcutaneous N/A Tissue Debridement Area (sq N/A 0.5 N/A cm): Instrument: N/A Curette N/A Bleeding: N/A Minimum N/A Hemostasis Achieved: N/A Pressure N/A Procedural Pain: N/A 0 N/A Post Procedural Pain: N/A 0 N/A Debridement Treatment N/A Procedure was not N/A Response: tolerated well Post Debridement N/A 0.5x1x0.2 N/A Measurements L x W x D (cm) Post Debridement N/A 0.079 N/A Volume: (cm) Periwound Skin Texture: Scarring: Yes Excoriation: No N/A Excoriation: No Induration: No Induration: No Callus: No Callus: No Crepitus: No Crepitus: No Rash: No Rash: No Scarring: No Periwound Skin Maceration: Yes Maceration: No N/A Moisture: Dry/Scaly: No Dry/Scaly: No Periwound Skin Color: Atrophie Blanche: No Ecchymosis: Yes N/A Cyanosis: No Atrophie Blanche: No Ecchymosis: No Cyanosis: No Erythema: No Erythema: No Hemosiderin Staining: No Hemosiderin Staining: No Mottled: No Mottled: No Pallor: No Pallor: No Rubor: No Rubor: No Temperature: No Abnormality No Abnormality N/A Tenderness on No No N/A Palpation: Wound Preparation: Ulcer Cleansing: Ulcer Cleansing: N/A Rinsed/Irrigated with Rinsed/Irrigated with [name], [name] ([id number]) Saline Saline Topical Anesthetic Topical Anesthetic Applied: Other: lidocaine Applied: Other: lidocaine 4% 4% Procedures Performed: N/A Debridement N/A Treatment Notes Electronic Signature(s) Signed: 05/17/2017 3:23:10 PM By: Christin Fudge MD, FACS Entered By: Christin Fudge on 05/17/2017 15:23:10 [name], [name] ([id number]) -------------------------------------------------------------------------------- [place] Details Patient Name: [name] Girt. Date of Service: 05/17/2017 3:00 PM Medical Record Number:  [id number] Patient Account Number: [id number] Date of Birth/Sex: Nov 03, 1971 (46 y.o. Male) Treating RN: Cornell Barman Primary Care Shakeila Pfarr: Einar Pheasant Other Clinician: Referring Nayanna Seaborn: Einar Pheasant Treating Tuyen Uncapher/Extender: Frann Rider in Treatment: 9 Active Inactive ` Orientation to the Wound Care Program Nursing Diagnoses: Knowledge deficit related to the wound healing center program Goals: Patient/caregiver will verbalize understanding of the [place] Program Date Initiated: 03/12/2017 Target Resolution Date: 07/12/2017 Goal Status: Active Interventions: Provide education on orientation to the wound center Notes: ` Wound/Skin Impairment Nursing Diagnoses: Impaired tissue integrity Knowledge deficit related to ulceration/compromised skin integrity Goals: Patient/caregiver will verbalize understanding of skin care regimen Date Initiated: 03/12/2017 Target Resolution Date: 07/12/2017 Goal Status: Active Ulcer/skin breakdown will have a volume reduction of 30% by week 4 Date Initiated: 03/12/2017 Target Resolution Date: 07/12/2017 Goal Status: Active Ulcer/skin breakdown will have a volume reduction of 50% by week 8 Date Initiated: 03/12/2017 Target Resolution Date: 07/12/2017 Goal Status: Active Ulcer/skin breakdown will have a volume reduction of 80% by week 12 Date Initiated: 03/12/2017 Target Resolution Date: 07/12/2017 Goal Status: Active Ulcer/skin breakdown will heal within 14 weeks [name], [name] ([id number]) Date Initiated: 03/12/2017 Target Resolution Date: 07/12/2017 Goal Status: Active Interventions: Assess patient/caregiver  ability to obtain necessary supplies Assess patient/caregiver ability to perform ulcer/skin care regimen upon admission and as needed Assess ulceration(s) every visit Provide education on ulcer and skin care Treatment Activities: Referred to DME Shaina Gullatt for dressing supplies : 03/12/2017 Skin care regimen initiated :  03/12/2017 Topical wound management initiated : 03/12/2017 Notes: Electronic Signature(s) Signed: 05/17/2017 5:10:09 PM By: Gretta Cool, BSN, RN, CWS, Kim RN, BSN Entered By: Gretta Cool, BSN, RN, CWS, Kim on 05/17/2017 15:11:49 [name], [name] ([id number]) -------------------------------------------------------------------------------- Pain Assessment Details Patient Name: [name] Girt. Date of Service: 05/17/2017 3:00 PM Medical Record Number: [id number] Patient Account Number: [id number] Date of Birth/Sex: [birth year] (46 y.o. Male) Treating RN: Cornell Barman Primary Care Grady Mohabir: Einar Pheasant Other Clinician: Referring Darus Hershman: Einar Pheasant Treating Markavious Micco/Extender: Frann Rider in Treatment: 9 Active Problems Location of Pain Severity and Description of Pain Patient Has Paino No Site Locations With Dressing Change: No Pain Management and Medication Current Pain Management: Electronic Signature(s) Signed: 05/17/2017 5:10:09 PM By: Gretta Cool, BSN, RN, CWS, Kim RN, BSN Entered By: Gretta Cool, BSN, RN, CWS, Kim on 05/17/2017 14:58:48 [name], [name] ([id number]) -------------------------------------------------------------------------------- Patient/Caregiver Education Details Patient Name: [name] Girt. Date of Service: 05/17/2017 3:00 PM Medical Record Number: [id number] Patient Account Number: [id number] Date of Birth/Gender: 1971-07-09 (46 y.o. Male) Treating RN: Cornell Barman Primary Care Physician: Einar Pheasant Other Clinician: Referring Physician: Einar Pheasant Treating Physician/Extender: Frann Rider in Treatment: 9 Education Assessment Education Provided To: Patient Education Topics Provided Wound/Skin Impairment: Handouts: Caring for Your Ulcer, Other: continue wound care as prescribed Methods: Demonstration, Explain/Verbal Responses: State content correctly Electronic Signature(s) Signed: 05/17/2017 5:10:09 PM By: Gretta Cool, BSN, RN, CWS, Kim RN, BSN Entered  By: Gretta Cool, BSN, RN, CWS, Kim on 05/17/2017 15:41:53 [name], [name] ([id number]) -------------------------------------------------------------------------------- Wound Assessment Details Patient Name: [name] Girt. Date of Service: 05/17/2017 3:00 PM Medical Record Number: [id number] Patient Account Number: [id number] Date of Birth/Sex: Jul 16, 1971 (46 y.o. Male) Treating RN: Cornell Barman Primary Care Palmina Clodfelter: Einar Pheasant Other Clinician: Referring Ezequias Lard: Einar Pheasant Treating Ricarda Atayde/Extender: Frann Rider in Treatment: 9 Wound Status Wound Number: 4 Primary Open Surgical Wound Etiology: Wound Location: Sacrum - Medial Wound Open Wounding Event: Surgical Injury Status: Date Acquired: 10/21/2016 Comorbid Type I Diabetes, History of pressure Weeks Of Treatment: 9 History: wounds, Paraplegia Clustered Wound: No Photos Photo Uploaded By: Gretta Cool, BSN, RN, CWS, Kim on 05/17/2017 15:42:53 Wound Measurements Length: (cm) 1.1 Width: (cm) 1.5 Depth: (cm) 0.1 Area: (cm) 1.296 Volume: (cm) 0.13 % Reduction in Area: 78% % Reduction in Volume: 89% Epithelialization: None Tunneling: No Undermining: No Wound Description Full Thickness Without Exposed Foul Odor After Classification: Support Structures Slough/Fibrino Wound Margin: Distinct, outline attached Exudate Medium Amount: Exudate Type: Serosanguineous Exudate Color: red, brown Cleansing: No No Wound Bed Granulation Amount: Large (67-100%) Exposed Structure Granulation Quality: Pink, Pale Fascia Exposed: No Necrotic Amount: None Present (0%) Fat Layer (Subcutaneous Tissue) Exposed: Yes Tendon Exposed: No Muscle Exposed: No [name], [name] ([id number]) Joint Exposed: No Bone Exposed: No Periwound Skin Texture Texture Color No Abnormalities Noted: No No Abnormalities Noted: No Callus: No Atrophie Blanche: No Crepitus: No Cyanosis: No Excoriation: No Ecchymosis: No Induration:  No Erythema: No Rash: No Hemosiderin Staining: No Scarring: Yes Mottled: No Pallor: No Moisture Rubor: No No Abnormalities Noted: No Dry / Scaly: No Temperature / Pain Maceration: Yes Temperature: No Abnormality Wound Preparation Ulcer Cleansing: Rinsed/Irrigated with Saline Topical Anesthetic Applied: Other: lidocaine 4%, Treatment Notes Wound #4 (Medial Sacrum) 1. Cleansed with: Clean wound with  Normal Saline 2. Anesthetic Topical Lidocaine 4% cream to wound bed prior to debridement Notes Sacrum: Sorbact, drawtex and BFD; Heel: Sorbalgon Ag and BFD Electronic Signature(s) Signed: 05/17/2017 5:10:09 PM By: Gretta Cool, BSN, RN, CWS, Kim RN, BSN Entered By: Gretta Cool, BSN, RN, CWS, Kim on 05/17/2017 15:09:14 [place], [name] ([id number]) -------------------------------------------------------------------------------- Wound Assessment Details Patient Name: [name] Girt. Date of Service: 05/17/2017 3:00 PM Medical Record Number: [id number] Patient Account Number: [id number] Date of Birth/Sex: 1971-06-13 (46 y.o. Male) Treating RN: Cornell Barman Primary Care Bertice Risse: Einar Pheasant Other Clinician: Referring Angeni Chaudhuri: Einar Pheasant Treating Jude Linck/Extender: Frann Rider in Treatment: 9 Wound Status Wound Number: 5 Primary Diabetic Wound/Ulcer of the Lower Etiology: Extremity Wound Location: Left Calcaneus Wound Open Wounding Event: Pressure Injury Status: Date Acquired: 03/15/2017 Comorbid Type I Diabetes, History of pressure Weeks Of Treatment: 8 History: wounds, Paraplegia Clustered Wound: No Photos Photo Uploaded By: Gretta Cool, BSN, RN, CWS, Kim on 05/17/2017 15:42:53 Wound Measurements Length: (cm) 0.5 Width: (cm) 1 Depth: (cm) 0.2 Area: (cm) 0.393 Volume: (cm) 0.079 % Reduction in Area: 37.4% % Reduction in Volume: -25.4% Epithelialization: None Tunneling: No Undermining: No Wound Description Classification: Grade 1 Foul Odor After Wound Margin: Flat  and Intact Slough/Fibrino Exudate Amount: Medium Exudate Type: Serosanguineous Exudate Color: red, brown Cleansing: No Yes Wound Bed Granulation Amount: Small (1-33%) Exposed Structure Granulation Quality: Pink Fascia Exposed: No Necrotic Amount: Large (67-100%) Fat Layer (Subcutaneous Tissue) Exposed: Yes Necrotic Quality: Adherent Slough Tendon Exposed: No Muscle Exposed: No Joint Exposed: No Bone Exposed: No [name], [name] ([id number]) Periwound Skin Texture Texture Color No Abnormalities Noted: No No Abnormalities Noted: No Callus: No Atrophie Blanche: No Crepitus: No Cyanosis: No Excoriation: No Ecchymosis: Yes Induration: No Erythema: No Rash: No Hemosiderin Staining: No Scarring: No Mottled: No Pallor: No Moisture Rubor: No No Abnormalities Noted: No Dry / Scaly: No Temperature / Pain Maceration: No Temperature: No Abnormality Wound Preparation Ulcer Cleansing: Rinsed/Irrigated with Saline Topical Anesthetic Applied: Other: lidocaine 4%, Treatment Notes Wound #5 (Left Calcaneus) Notes Sacrum: Sorbact, drawtex and BFD; Heel: Sorbalgon Ag and BFD Electronic Signature(s) Signed: 05/17/2017 5:10:09 PM By: Gretta Cool, BSN, RN, CWS, Kim RN, BSN Entered By: Gretta Cool, BSN, RN, CWS, Kim on 05/17/2017 15:09:32 [name], [name] ([id number]) -------------------------------------------------------------------------------- Vitals Details Patient Name: [name] Girt. Date of Service: 05/17/2017 3:00 PM Medical Record Number: [id number] Patient Account Number: [id number] Date of Birth/Sex: 1971-02-25 (46 y.o. Male) Treating RN: Cornell Barman Primary Care [place]ington Devine: Einar Pheasant Other Clinician: Referring Matin Mattioli: Einar Pheasant Treating Brenner Visconti/Extender: Frann Rider in Treatment: 9 Vital Signs Time Taken: 14:58 Temperature (F): 97.7 Height (in): 72 Pulse (bpm): 68 Weight (lbs): 170 Respiratory Rate (breaths/min): 16 Body Mass Index (BMI): 23.1 Blood  Pressure (mmHg): 100/60 Reference Range: 80 - 120 mg / dl Electronic Signature(s) Signed: 05/17/2017 5:10:09 PM By: Gretta Cool, BSN, RN, CWS, Kim RN, BSN Entered By: Gretta Cool, BSN, RN, CWS, Kim on 05/17/2017 15:00:18
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
## 2017-05-24 ENCOUNTER — Other Ambulatory Visit: Payer: Self-pay | Admitting: Internal Medicine

## 2017-05-24 ENCOUNTER — Encounter: Payer: Medicare Other | Admitting: Surgery

## 2017-05-24 DIAGNOSIS — E10622 Type 1 diabetes mellitus with other skin ulcer: Secondary | ICD-10-CM | POA: Diagnosis not present
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
## 2017-05-25 NOTE — Progress Notes (Signed)
[name], [name] ([id number]) Visit Report for 05/24/2017 Arrival Information Details Patient Name: [name], [name]. Date of Service: 05/24/2017 3:00 PM Medical Record Number: [id number] Patient Account Number: [id number] Date of Birth/Sex: 1971/01/04 (46 y.o. Male) Treating RN: Cornell Barman Primary Care Perle Brickhouse: Einar Pheasant Other Clinician: Referring Aundra Espin: Einar Pheasant Treating Randie Bloodgood/Extender: Frann Rider in Treatment: 10 Visit Information History Since Last Visit Added or deleted any medications: No Patient Arrived: Wheel Chair Any new allergies or adverse reactions: No Arrival Time: 15:07 Had a fall or experienced change in No activities of daily living that may affect Accompanied By: self risk of falls: Transfer Assistance: None Signs or symptoms of abuse/neglect since last No Patient Identification Verified: Yes visito Secondary Verification Process Yes Hospitalized since last visit: No Completed: Has Dressing in Place as Prescribed: Yes Patient Requires Transmission-Based No Pain Present Now: No Precautions: Patient Has Alerts: No Electronic Signature(s) Signed: 05/24/2017 4:23:00 PM By: Gretta Cool, BSN, RN, CWS, Kim RN, BSN Entered By: Gretta Cool, BSN, RN, CWS, Kim on 05/24/2017 15:07:46 Roebling, [name] ([id number]) -------------------------------------------------------------------------------- Clinic Level of Care Assessment Details Patient Name: [name]. Date of Service: 05/24/2017 3:00 PM Medical Record Number: [id number] Patient Account Number: [id number] Date of Birth/Sex: 1971-07-15 (46 y.o. Male) Treating RN: Cornell Barman Primary Care Dang Mathison: Einar Pheasant Other Clinician: Referring Loren Sawaya: Einar Pheasant Treating Cait Locust/Extender: Frann Rider in Treatment: 10 Clinic Level of Care Assessment Items TOOL 4 Quantity Score []  - Use when only an EandM is performed on FOLLOW-UP visit 0 ASSESSMENTS - Nursing Assessment /  Reassessment []  - Reassessment of Co-morbidities (includes updates in patient status) 0 X - Reassessment of Adherence to Treatment Plan 1 5 ASSESSMENTS - Wound and Skin Assessment / Reassessment []  - Simple Wound Assessment / Reassessment - one wound 0 X - Complex Wound Assessment / Reassessment - multiple wounds 2 5 []  - Dermatologic / Skin Assessment (not related to wound area) 0 ASSESSMENTS - Focused Assessment []  - Circumferential Edema Measurements - multi extremities 0 []  - Nutritional Assessment / Counseling / Intervention 0 []  - Lower Extremity Assessment (monofilament, tuning fork, pulses) 0 []  - Peripheral Arterial Disease Assessment (using hand held doppler) 0 ASSESSMENTS - Ostomy and/or Continence Assessment and Care []  - Incontinence Assessment and Management 0 []  - Ostomy Care Assessment and Management (repouching, etc.) 0 PROCESS - Coordination of Care X - Simple Patient / Family Education for ongoing care 1 15 []  - Complex (extensive) Patient / Family Education for ongoing care 0 []  - Staff obtains Programmer, systems, Records, Test Results / Process Orders 0 []  - Staff telephones HHA, Nursing Homes / Clarify orders / etc 0 []  - Routine Transfer to another Facility (non-emergent condition) 0 [name], [name] ([id number]) []  - Routine Hospital Admission (non-emergent condition) 0 []  - New Admissions / Biomedical engineer / Ordering NPWT, Apligraf, etc. 0 []  - Emergency Hospital Admission (emergent condition) 0 X - Simple Discharge Coordination 1 10 []  - Complex (extensive) Discharge Coordination 0 PROCESS - Special Needs []  - Pediatric / Minor Patient Management 0 []  - Isolation Patient Management 0 []  - Hearing / Language / Visual special needs 0 []  - Assessment of Community assistance (transportation, D/C planning, etc.) 0 []  - Additional assistance / Altered mentation 0 []  - Support Surface(s) Assessment (bed, cushion, seat, etc.) 0 INTERVENTIONS - Wound Cleansing /  Measurement []  - Simple Wound Cleansing - one wound 0 X - Complex Wound Cleansing - multiple wounds 2 5 X - Wound Imaging (photographs - any  number of wounds) 1 5 []  - Wound Tracing (instead of photographs) 0 []  - Simple Wound Measurement - one wound 0 X - Complex Wound Measurement - multiple wounds 2 5 INTERVENTIONS - Wound Dressings []  - Small Wound Dressing one or multiple wounds 0 X - Medium Wound Dressing one or multiple wounds 2 15 []  - Large Wound Dressing one or multiple wounds 0 []  - Application of Medications - topical 0 []  - Application of Medications - injection 0 INTERVENTIONS - Miscellaneous []  - External ear exam 0 [name], [name] ([id number]) []  - Specimen Collection (cultures, biopsies, blood, body fluids, etc.) 0 []  - Specimen(s) / Culture(s) sent or taken to Lab for analysis 0 []  - Patient Transfer (multiple staff / Harrel Lemon Lift / Similar devices) 0 []  - Simple Staple / Suture removal (25 or less) 0 []  - Complex Staple / Suture removal (26 or more) 0 []  - Hypo / Hyperglycemic Management (close monitor of Blood Glucose) 0 []  - Ankle / Brachial Index (ABI) - do not check if billed separately 0 X - Vital Signs 1 5 Has the patient been seen at the hospital within the last three years: Yes Total Score: 100 Level Of Care: New/Established - Level 3 Electronic Signature(s) Signed: 05/24/2017 4:23:00 PM By: Gretta Cool, BSN, RN, CWS, Kim RN, BSN Entered By: Gretta Cool, BSN, RN, CWS, Kim on 05/24/2017 15:38:46 [name], [name] ([id number]) -------------------------------------------------------------------------------- Encounter Discharge Information Details Patient Name: [name]. Date of Service: 05/24/2017 3:00 PM Medical Record Number: [id number] Patient Account Number: [id number] Date of Birth/Sex: 01-Oct-1971 (46 y.o. Male) Treating RN: Cornell Barman Primary Care Zurri Rudden: Einar Pheasant Other Clinician: Referring Rogelio Waynick: Einar Pheasant Treating Pasqual Farias/Extender: Frann Rider in Treatment: 10 Encounter Discharge Information Items Discharge Pain Level: 0 Discharge Condition: Stable Ambulatory Status: Wheelchair Discharge Destination: Home Private Transportation: Auto Accompanied By: self Schedule Follow-up Appointment: Yes Medication Reconciliation completed and Yes provided to Patient/Care Alexea Blase: Clinical Summary of Care: Electronic Signature(s) Signed: 05/24/2017 3:40:29 PM By: Gretta Cool, BSN, RN, CWS, Kim RN, BSN Entered By: Gretta Cool, BSN, RN, CWS, Kim on 05/24/2017 15:40:29 Lindahl, [name] ([id number]) -------------------------------------------------------------------------------- Lower Extremity Assessment Details Patient Name: [name]. Date of Service: 05/24/2017 3:00 PM Medical Record Number: [id number] Patient Account Number: [id number] Date of Birth/Sex: 1971/02/05 (46 y.o. Male) Treating RN: Cornell Barman Primary Care Orval Dortch: Einar Pheasant Other Clinician: Referring Joby Hershkowitz: Einar Pheasant Treating Kalil Woessner/Extender: Frann Rider in Treatment: 10 Vascular Assessment Pulses: Dorsalis Pedis Palpable: [Left:Yes] Posterior Tibial Extremity colors, hair growth, and conditions: Extremity Color: [Left:Normal] Hair Growth on Extremity: [Left:Yes] Temperature of Extremity: [Left:Warm] Capillary Refill: [Left:> 3 seconds] Dependent Rubor: [Left:No] Blanched when Elevated: [Left:No] Lipodermatosclerosis: [Left:No] Electronic Signature(s) Signed: 05/24/2017 4:23:00 PM By: Gretta Cool, BSN, RN, CWS, Kim RN, BSN Entered By: Gretta Cool, BSN, RN, CWS, Kim on 05/24/2017 15:22:39 [name], [name] ([id number]) -------------------------------------------------------------------------------- Multi Wound Chart Details Patient Name: [name]. Date of Service: 05/24/2017 3:00 PM Medical Record Number: [id number] Patient Account Number: [id number] Date of Birth/Sex: [birth year] (46 y.o. 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:Open Surgical Wound] [5:Diabetic Wound/Ulcer of the Lower Extremity] [N/A:N/A] Date Acquired: [4:10/21/2016] [5:03/15/2017] [N/A:N/A]  Weeks of Treatment: [4:10] [5:9] [N/A:N/A] Wound Status: [4:Open] [5:Open] [N/A:N/A] Measurements L x W x D 1x1.5x0.1 [5:0.2x0.3x0.1] [N/A:N/A] (cm) Area (cm) : [4:1.178] [5:0.047] [N/A:N/A] Volume (cm) : [4:0.118] [5:0.005] [N/A:N/A] % Reduction in Area: [4:80.00%] [5:92.50%] [N/A:N/A] % Reduction in Volume: 90.00% [5:92.10%] [N/A:N/A] Classification: [4:Full Thickness Without Exposed Support Structures] [5:Grade 1] [N/A:N/A] Periwound Skin Texture: No Abnormalities Noted [5:No Abnormalities Noted] [N/A:N/A] Periwound Skin [4:No Abnormalities Noted] [5:No Abnormalities Noted] [N/A:N/A] Moisture: Periwound Skin Color: No Abnormalities Noted [5:No Abnormalities Noted] [N/A:N/A] Tenderness on [4:No] [5:No] [N/A:N/A] Treatment Notes Electronic Signature(s) Signed: 05/24/2017 3:29:23 PM By: Christin Fudge MD, FACS [name], [name] ([id number]) Entered By: Christin Fudge on 05/24/2017 15:29:23 [name], [name] ([id number]) -------------------------------------------------------------------------------- [name] Details Patient Name: [name]. Date of Service: 05/24/2017 3:00 PM Medical Record Number: [id number] Patient Account Number: [id number] Date of Birth/Sex: February 14, 1971 (46 y.o. Male) Treating RN: Cornell Barman Primary Care Tennyson Wacha: Einar Pheasant Other Clinician: Referring Haidyn Chadderdon: Einar Pheasant Treating Sharmila Wrobleski/Extender: Frann Rider in Treatment: 10 Active Inactive ` Orientation to the Wound Care Program Nursing Diagnoses: Knowledge deficit related to the wound healing center program Goals: Patient/caregiver will verbalize understanding of the [facility] Date Initiated: 03/12/2017 Target Resolution Date: 07/12/2017 Goal Status: Active Interventions: Provide education on orientation to the wound center Notes: ` Wound/Skin Impairment Nursing Diagnoses: Impaired tissue integrity Knowledge deficit related to ulceration/compromised skin integrity Goals: Patient/caregiver will verbalize understanding of skin care regimen Date Initiated: 03/12/2017 Target Resolution Date: 07/12/2017 Goal Status: Active Ulcer/skin breakdown will have a volume reduction of 30% by week 4 Date Initiated: 03/12/2017 Target Resolution Date: 07/12/2017 Goal Status: Active Ulcer/skin breakdown will have a volume reduction of 50% by week 8 Date Initiated: 03/12/2017 Target Resolution Date: 07/12/2017 Goal Status: Active Ulcer/skin breakdown will have a volume reduction of 80% by week 12 Date Initiated: 03/12/2017 Target Resolution Date: 07/12/2017 Goal Status: Active Ulcer/skin breakdown will heal within 14 weeks JAYDEE, CONRAN ([id number]) Date Initiated: 03/12/2017 Target Resolution Date: 07/12/2017 Goal Status: Active Interventions: Assess patient/caregiver ability to obtain necessary supplies Assess patient/caregiver ability to perform ulcer/skin care regimen upon admission and as needed Assess ulceration(s) every visit Provide education on ulcer and skin care Treatment Activities: Referred to DME Jaylin Roundy for dressing supplies : 03/12/2017 Skin care regimen initiated : 03/12/2017 Topical wound management initiated : 03/12/2017 Notes: Electronic Signature(s) Signed: 05/24/2017 3:37:38 PM  By: Gretta Cool, BSN, RN, CWS, Kim RN, BSN Entered By: Gretta Cool, BSN, RN, CWS, Kim on 05/24/2017 15:37:37 [name], [name] ([id number]) -------------------------------------------------------------------------------- Pain Assessment Details Patient Name: [name]. Date of Service: 05/24/2017 3:00 PM Medical Record Number: [id number] Patient Account Number: [id number] Date of Birth/Sex: Jul 10, 1971 (46 y.o. Male) Treating RN: Cornell Barman Primary Care Marvell Tamer: Einar Pheasant Other Clinician: Referring Chey Rachels: Einar Pheasant Treating Monica Zahler/Extender: Frann Rider in Treatment: 10 Active Problems Location of Pain Severity and Description of Pain Patient Has Paino No Site Locations With Dressing Change: No Pain Management and Medication Current Pain Management: Electronic Signature(s) Signed: 05/24/2017 4:23:00 PM By: Gretta Cool, BSN, RN, CWS, Kim RN, BSN Entered By: Gretta Cool, BSN, RN, CWS, Kim on 05/24/2017 15:07:54 [name], [name] ([id number]) -------------------------------------------------------------------------------- Patient/Caregiver Education Details Patient Name: [name]. Date of Service: 05/24/2017 3:00 PM Medical Record Number: [id number] Patient Account Number: [id number] Date of Birth/Gender: 24-Mar-1971 (46 y.o. Male) Treating RN: Cornell Barman Primary Care Physician: Einar Pheasant Other Clinician: Referring Physician: Einar Pheasant Treating Physician/Extender: Frann Rider in Treatment: 10 Education Assessment Education Provided To: Patient Education  Topics Provided Pressure: Handouts: Pressure Ulcers: Care and Offloading Methods: Demonstration, Explain/Verbal Responses: State content correctly Wound/Skin Impairment: Handouts: Caring for Your Ulcer, Other: dressing changes as prescribed Methods: Demonstration, Explain/Verbal Responses: State content correctly Electronic Signature(s) Signed: 05/24/2017 4:23:00 PM By: Gretta Cool, BSN, RN, CWS, Kim RN,  BSN Entered By: Gretta Cool, BSN, RN, CWS, Kim on 05/24/2017 15:40:58 [name], [name] ([id number]) -------------------------------------------------------------------------------- Wound Assessment Details Patient Name: [name]. Date of Service: 05/24/2017 3:00 PM Medical Record Number: [id number] Patient Account Number: [id number] Date of Birth/Sex: 1971/05/16 (46 y.o. Male) Treating RN: Cornell Barman Primary Care Ziya Coonrod: Einar Pheasant Other Clinician: Referring Ahmaad Neidhardt: Einar Pheasant Treating Avish Torry/Extender: Frann Rider in Treatment: 10 Wound Status Wound Number: 4 Primary Etiology: Open Surgical Wound Wound Location: Medial Sacrum Wound Status: Open Wounding Event: Surgical Injury Date Acquired: 10/21/2016 Weeks Of Treatment: 10 Clustered Wound: No Photos Photo Uploaded By: Gretta Cool, BSN, RN, CWS, Kim on 05/24/2017 15:41:36 Wound Measurements Length: (cm) 1 Width: (cm) 1.5 Depth: (cm) 0.1 Area: (cm) 1.178 Volume: (cm) 0.118 % Reduction in Area: 80% % Reduction in Volume: 90% Wound Description Full Thickness Without Exposed Classification: Support Structures Periwound Skin Texture Texture Color No Abnormalities Noted: No No Abnormalities Noted: No Moisture No Abnormalities Noted: No Treatment Notes Wound #4 (Medial Sacrum) 1. Cleansed with: Clean wound with Normal Saline Buffin, [name] ([id number]) 2. Anesthetic Topical Lidocaine 4% cream to wound bed prior to debridement Notes Sacrum: Sorbact, drawtex and BFD; Heel: Aquacel Ag and BFD Electronic Signature(s) Signed: 05/24/2017 4:23:00 PM By: Gretta Cool, BSN, RN, CWS, Kim RN, BSN Entered By: Gretta Cool, BSN, RN, CWS, Kim on 05/24/2017 15:16:43 [name], [name] ([id number]) -------------------------------------------------------------------------------- Wound Assessment Details Patient Name: [name]. Date of Service: 05/24/2017 3:00 PM Medical Record Number: [id number] Patient Account Number:  [id number] Date of Birth/Sex: [birth year] (46 y.o. Male) Treating RN: Cornell Barman Primary Care Brayan Votaw: Einar Pheasant Other Clinician: Referring Kamdyn Covel: Einar Pheasant Treating Ashaya Raftery/Extender: Frann Rider in Treatment: 10 Wound Status Wound Number: 5 Primary Diabetic Wound/Ulcer of the Lower Etiology: Extremity Wound Location: Left Calcaneus Wound Status: Open Wounding Event: Pressure Injury Date Acquired: 03/15/2017 Weeks Of Treatment: 9 Clustered Wound: No Photos Photo Uploaded By: Gretta Cool, BSN, RN, CWS, Kim on 05/24/2017 15:41:36 Wound Measurements Length: (cm) 0.2 Width: (cm) 0.3 Depth: (cm) 0.1 Area: (cm) 0.047 Volume: (cm) 0.005 % Reduction in Area: 92.5% % Reduction in Volume: 92.1% Wound Description Classification: Grade 1 Periwound Skin Texture Texture Color No Abnormalities Noted: No No Abnormalities Noted: No Moisture No Abnormalities Noted: No Treatment Notes Wound #5 (Left Calcaneus) Notes Sacrum: Sorbact, drawtex and BFD; Heel: Sorbalgon Ag and BFD TAVIO, BIEGEL ([id number]) Electronic Signature(s) Signed: 05/24/2017 4:23:00 PM By: Gretta Cool, BSN, RN, CWS, Kim RN, BSN Entered By: Gretta Cool, BSN, RN, CWS, Kim on 05/24/2017 15:16:43 [name] [place], [name] ([id number]) -------------------------------------------------------------------------------- Vitals Details Patient Name: [name]. Date of Service: 05/24/2017 3:00 PM Medical Record Number: [id number] Patient Account Number: [id number] Date of Birth/Sex: 06-21-1971 (46 y.o. Male) Treating RN: Cornell Barman Primary Care Estelle Skibicki: Einar Pheasant Other Clinician: Referring Lilian Fuhs: Einar Pheasant Treating Sequoyah Counterman/Extender: Frann Rider in Treatment: 10 Vital Signs Time Taken: 15:07 Temperature (F): 97.8 Height (in): 72 Pulse (bpm): 67 Weight (lbs): 170 Respiratory Rate (breaths/min): 16 Body Mass Index (BMI): 23.1 Blood Pressure (mmHg): 135/90 Reference Range: 80 - 120 mg /  dl Electronic Signature(s) Signed: 05/24/2017 4:23:00 PM By: Gretta Cool, BSN, RN, CWS, Kim RN, BSN Entered By: Gretta Cool, BSN, RN, CWS, Kim on 05/24/2017 15:08:19

## 2017-05-31 ENCOUNTER — Encounter: Payer: Medicare Other | Admitting: Physician Assistant

## 2017-05-31 DIAGNOSIS — E10622 Type 1 diabetes mellitus with other skin ulcer: Secondary | ICD-10-CM | POA: Diagnosis not present
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
## 2017-06-01 NOTE — Progress Notes (Signed)
[name], [name] ([id number]) Visit Report for 05/31/2017 Arrival Information Details Patient Name: [name], [name]. Date of Service: 05/31/2017 3:00 PM Medical Record Number: [id number] Patient Account Number: [id number] Date of Birth/Sex: [birth year] (46 y.o. Male) Treating RN: Cornell Barman Primary Care Nuriyah Hanline: Einar Pheasant Other Clinician: Referring Analiah Drum: Einar Pheasant Treating Mirel Hundal/Extender: Melburn Hake, HOYT Weeks in Treatment: 11 Visit Information History Since Last Visit Added or deleted any medications: No Patient Arrived: Wheel Chair Any new allergies or adverse reactions: No Arrival Time: 15:00 Had a fall or experienced change in No activities of daily living that may affect Accompanied By: self risk of falls: Transfer Assistance: Manual Signs or symptoms of abuse/neglect since last No Patient Identification Verified: Yes visito Secondary Verification Process Yes Hospitalized since last visit: No Completed: Has Dressing in Place as Prescribed: Yes Patient Requires Transmission-Based No Pain Present Now: No Precautions: Patient Has Alerts: No Electronic Signature(s) Signed: 05/31/2017 4:17:42 PM By: Gretta Cool, BSN, RN, CWS, Kim RN, BSN Entered By: Gretta Cool, BSN, RN, CWS, Kim on 05/31/2017 15:01:12 Hagan, [name] ([id number]) -------------------------------------------------------------------------------- Lower Extremity Assessment Details Patient Name: [name]. Date of Service: 05/31/2017 3:00 PM Medical Record Number: [id number] Patient Account Number: [id number] Date of Birth/Sex: [birth year] (46 y.o. Male) Treating RN: Cornell Barman Primary Care Laranda Burkemper: Einar Pheasant Other Clinician: Referring Jasir Rother: Einar Pheasant Treating Eden Toohey/Extender: Melburn Hake, HOYT Weeks in Treatment: 11 Vascular Assessment Pulses: Dorsalis Pedis Palpable: [Right:Yes] Posterior Tibial Extremity colors, hair growth, and conditions: Extremity Color:  [Right:Normal] Hair Growth on Extremity: [Right:Yes] Temperature of Extremity: [Right:Cool] Capillary Refill: [Right:> 3 seconds] Dependent Rubor: [Right:No] Blanched when Elevated: [Right:No] Lipodermatosclerosis: [Right:No] Toe Nail Assessment Left: Right: Thick: No Discolored: No Deformed: No Improper Length and Hygiene: No Electronic Signature(s) Signed: 05/31/2017 4:17:42 PM By: Gretta Cool, BSN, RN, CWS, Kim RN, BSN Entered By: Gretta Cool, BSN, RN, CWS, Kim on 05/31/2017 15:12:19 [name], [name] ([id number]) -------------------------------------------------------------------------------- Multi Wound Chart Details Patient Name: [name]. Date of Service: 05/31/2017 3:00 PM Medical Record Number: [id number] Patient Account Number: [id number] Date of Birth/Sex: [birth year] (46 y.o. Male) Treating RN: Cornell Barman Primary Care Zarius Furr: Einar Pheasant Other Clinician: Referring Faust Thorington: Einar Pheasant Treating Kathryn Cosby/Extender: Melburn Hake, HOYT Weeks in Treatment: 11 Vital Signs Height(in): 72 Pulse(bpm): 63 Weight(lbs): 170 Blood Pressure 109/71 (mmHg): Body Mass Index(BMI): 23 Temperature(F): 98.2 Respiratory Rate 16 (breaths/min): Photos: [4:No Photos] [5:No Photos] [N/A:N/A] Wound Location: [4:Medial Sacrum] [5:Left Calcaneus] [N/A:N/A] Wounding Event: [4:Surgical Injury] [5:Pressure Injury] [N/A:N/A] Primary Etiology: [4:Open Surgical Wound] [5:Diabetic Wound/Ulcer of the Lower Extremity] [N/A:N/A] Date Acquired: [4:10/21/2016] [5:03/15/2017] [N/A:N/A] Weeks of Treatment: [4:11] [5:10] [N/A:N/A] Wound Status: [4:Open] [5:Open] [N/A:N/A] Measurements L x W x D 1x1.2x0.1 [5:0.2x0.3x0.1] [N/A:N/A] (cm) Area (cm) : [4:0.942] [5:0.047] [N/A:N/A] Volume (cm) : [4:0.094] [5:0.005] [N/A:N/A] % Reduction in Area: [4:84.00%] [5:92.50%] [N/A:N/A] % Reduction in Volume: 92.00% [5:92.10%] [N/A:N/A] Classification: [4:Full Thickness Without Exposed Support Structures]  [5:Grade 1] [N/A:N/A] Periwound Skin Texture: No Abnormalities Noted [5:No Abnormalities Noted] [N/A:N/A] Periwound Skin [4:No Abnormalities Noted] [5:No Abnormalities Noted] [N/A:N/A] Moisture: Periwound Skin Color: No Abnormalities Noted [5:No Abnormalities Noted] [N/A:N/A] Tenderness on [4:No] [5:No] [N/A:N/A] Treatment Notes Electronic Signature(s) Signed: 05/31/2017 4:17:42 PM By: Gretta Cool, BSN, RN, CWS, Kim RN, BSN [name], [name] ([id number]) Entered By: Gretta Cool, BSN, RN, CWS, Kim on 05/31/2017 15:12:36 [name], [name] ([id number]) -------------------------------------------------------------------------------- [place] Details Patient Name: [name]. Date of Service: 05/31/2017 3:00 PM Medical Record Number: [id number] Patient Account Number: [id number] Date of Birth/Sex: [birth year] (46 y.o. Male) Treating RN: Cornell Barman Primary  Care Reshad Saab: Einar Pheasant Other Clinician: Referring Claris Pech: Einar Pheasant Treating Darnesha Diloreto/Extender: Melburn Hake, HOYT Weeks in Treatment: 11 Active Inactive ` Orientation to the Wound Care Program Nursing Diagnoses: Knowledge deficit related to the wound healing center program Goals: Patient/caregiver will verbalize understanding of the [facility] Date Initiated: 03/12/2017 Target Resolution Date: 07/12/2017 Goal Status: Active Interventions: Provide education on orientation to the wound center Notes: ` Wound/Skin Impairment Nursing Diagnoses: Impaired tissue integrity Knowledge deficit related to ulceration/compromised skin integrity Goals: Patient/caregiver will verbalize understanding of skin care regimen Date Initiated: 03/12/2017 Target Resolution Date: 07/12/2017 Goal Status: Active Ulcer/skin breakdown will have a volume reduction of 30% by week 4 Date Initiated: 03/12/2017 Target Resolution Date: 07/12/2017 Goal Status: Active Ulcer/skin breakdown will have a volume reduction of 50% by week  8 Date Initiated: 03/12/2017 Target Resolution Date: 07/12/2017 Goal Status: Active Ulcer/skin breakdown will have a volume reduction of 80% by week 12 Date Initiated: 03/12/2017 Target Resolution Date: 07/12/2017 Goal Status: Active Ulcer/skin breakdown will heal within 14 weeks [name], [name] ([id number]) Date Initiated: 03/12/2017 Target Resolution Date: 07/12/2017 Goal Status: Active Interventions: Assess patient/caregiver ability to obtain necessary supplies Assess patient/caregiver ability to perform ulcer/skin care regimen upon admission and as needed Assess ulceration(s) every visit Provide education on ulcer and skin care Treatment Activities: Referred to DME Latroya Ng for dressing supplies : 03/12/2017 Skin care regimen initiated : 03/12/2017 Topical wound management initiated : 03/12/2017 Notes: Electronic Signature(s) Signed: 05/31/2017 4:17:42 PM By: Gretta Cool, BSN, RN, CWS, Kim RN, BSN Entered By: Gretta Cool, BSN, RN, CWS, Kim on 05/31/2017 15:12:26 [name], [name] ([id number]) -------------------------------------------------------------------------------- Pain Assessment Details Patient Name: [name]. Date of Service: 05/31/2017 3:00 PM Medical Record Number: [id number] Patient Account Number: [id number] Date of Birth/Sex: 05-29-1971 (46 y.o. Male) Treating RN: Cornell Barman Primary Care Larisa Lanius: Einar Pheasant Other Clinician: Referring Courtnee Myer: Einar Pheasant Treating Sherlie Boyum/Extender: Melburn Hake, HOYT Weeks in Treatment: 11 Active Problems Location of Pain Severity and Description of Pain Patient Has Paino No Site Locations With Dressing Change: No Pain Management and Medication Current Pain Management: Electronic Signature(s) Signed: 05/31/2017 4:17:42 PM By: Gretta Cool, BSN, RN, CWS, Kim RN, BSN Entered By: Gretta Cool, BSN, RN, CWS, Kim on 05/31/2017 15:01:21 [name], [name] ([id number]) -------------------------------------------------------------------------------- Wound  Assessment Details Patient Name: [name]. Date of Service: 05/31/2017 3:00 PM Medical Record Number: [id number] Patient Account Number: [id number] Date of Birth/Sex: [birth year] (46 y.o. Male) Treating RN: Cornell Barman Primary Care Luvena Wentling: Einar Pheasant Other Clinician: Referring Bianka Liberati: Einar Pheasant Treating Trilby Way/Extender: Melburn Hake, HOYT Weeks in Treatment: 11 Wound Status Wound Number: 4 Primary Etiology: Open Surgical Wound Wound Location: Medial Sacrum Wound Status: Open Wounding Event: Surgical Injury Date Acquired: 10/21/2016 Weeks Of Treatment: 11 Clustered Wound: No Wound Measurements Length: (cm) 1 Width: (cm) 1.2 Depth: (cm) 0.1 Area: (cm) 0.942 Volume: (cm) 0.094 % Reduction in Area: 84% % Reduction in Volume: 92% Wound Description Full Thickness Without Exposed Classification: Support Structures Periwound Skin Texture Texture Color No Abnormalities Noted: No No Abnormalities Noted: No Moisture No Abnormalities Noted: No Electronic Signature(s) Signed: 05/31/2017 4:17:42 PM By: Gretta Cool, BSN, RN, CWS, Kim RN, BSN Entered By: Gretta Cool, BSN, RN, CWS, Kim on 05/31/2017 15:10:31 [name], [name] ([id number]) -------------------------------------------------------------------------------- Wound Assessment Details Patient Name: [name]. Date of Service: 05/31/2017 3:00 PM Medical Record Number: [id number] Patient Account Number: [id number] Date of Birth/Sex: [birth year] (46 y.o. Male) Treating RN: Cornell Barman Primary Care Correna Meacham: Einar Pheasant Other Clinician: Referring Tiah Heckel: Einar Pheasant Treating Isa Hitz/Extender:  STONE III, HOYT Weeks in Treatment: 11 Wound Status Wound Number: 5 Primary Diabetic Wound/Ulcer of the Lower Etiology: Extremity Wound Location: Left Calcaneus Wound Status: Open Wounding Event: Pressure Injury Date Acquired: 03/15/2017 Weeks Of Treatment: 10 Clustered Wound: No Wound Measurements Length: (cm)  0.2 Width: (cm) 0.3 Depth: (cm) 0.1 Area: (cm) 0.047 Volume: (cm) 0.005 % Reduction in Area: 92.5% % Reduction in Volume: 92.1% Wound Description Classification: Grade 1 Periwound Skin Texture Texture Color No Abnormalities Noted: No No Abnormalities Noted: No Moisture No Abnormalities Noted: No Electronic Signature(s) Signed: 05/31/2017 4:17:42 PM By: Gretta Cool, BSN, RN, CWS, Kim RN, BSN Entered By: Gretta Cool, BSN, RN, CWS, Kim on 05/31/2017 15:10:31 [name], [name] ([id number]) -------------------------------------------------------------------------------- Vitals Details Patient Name: [name]. Date of Service: 05/31/2017 3:00 PM Medical Record Number: [id number] Patient Account Number: [id number] Date of Birth/Sex: 05/20/1971 (46 y.o. Male) Treating RN: Cornell Barman Primary Care Kacee Sukhu: Einar Pheasant Other Clinician: Referring Manju Kulkarni: Einar Pheasant Treating Garik Diamant/Extender: Melburn Hake, HOYT Weeks in Treatment: 11 Vital Signs Time Taken: 15:01 Temperature (F): 98.2 Height (in): 72 Pulse (bpm): 63 Weight (lbs): 170 Respiratory Rate (breaths/min): 16 Body Mass Index (BMI): 23.1 Blood Pressure (mmHg): 109/71 Reference Range: 80 - 120 mg / dl Electronic Signature(s) Signed: 05/31/2017 4:17:42 PM By: Gretta Cool, BSN, RN, CWS, Kim RN, BSN Entered By: Gretta Cool, BSN, RN, CWS, Kim on 05/31/2017 15:01:51

## 2017-06-07 ENCOUNTER — Encounter: Payer: Medicare Other | Attending: Surgery | Admitting: Surgery

## 2017-06-07 DIAGNOSIS — F329 Major depressive disorder, single episode, unspecified: Secondary | ICD-10-CM | POA: Insufficient documentation

## 2017-06-07 DIAGNOSIS — G8254 Quadriplegia, C5-C7 incomplete: Secondary | ICD-10-CM | POA: Diagnosis not present

## 2017-06-07 DIAGNOSIS — Z794 Long term (current) use of insulin: Secondary | ICD-10-CM | POA: Diagnosis not present

## 2017-06-07 DIAGNOSIS — Z79899 Other long term (current) drug therapy: Secondary | ICD-10-CM | POA: Diagnosis not present

## 2017-06-07 DIAGNOSIS — Z91041 Radiographic dye allergy status: Secondary | ICD-10-CM | POA: Insufficient documentation

## 2017-06-07 DIAGNOSIS — I10 Essential (primary) hypertension: Secondary | ICD-10-CM | POA: Diagnosis not present

## 2017-06-07 DIAGNOSIS — Z882 Allergy status to sulfonamides status: Secondary | ICD-10-CM | POA: Insufficient documentation

## 2017-06-07 DIAGNOSIS — T8131XA Disruption of external operation (surgical) wound, not elsewhere classified, initial encounter: Secondary | ICD-10-CM | POA: Diagnosis not present

## 2017-06-07 DIAGNOSIS — Z888 Allergy status to other drugs, medicaments and biological substances status: Secondary | ICD-10-CM | POA: Insufficient documentation

## 2017-06-07 DIAGNOSIS — G8929 Other chronic pain: Secondary | ICD-10-CM | POA: Diagnosis not present

## 2017-06-07 DIAGNOSIS — E10622 Type 1 diabetes mellitus with other skin ulcer: Secondary | ICD-10-CM | POA: Insufficient documentation

## 2017-06-07 DIAGNOSIS — L8943 Pressure ulcer of contiguous site of back, buttock and hip, stage 3: Secondary | ICD-10-CM | POA: Diagnosis not present

## 2017-06-07 DIAGNOSIS — L89622 Pressure ulcer of left heel, stage 2: Secondary | ICD-10-CM | POA: Diagnosis not present

## 2017-06-07 DIAGNOSIS — Y839 Surgical procedure, unspecified as the cause of abnormal reaction of the patient, or of later complication, without mention of misadventure at the time of the procedure: Secondary | ICD-10-CM | POA: Diagnosis not present
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
## 2017-06-08 NOTE — Progress Notes (Signed)
CRYSTAL, ELLWOOD ([id number]) Visit Report for 06/07/2017 Arrival Information Details Patient Name: CREEDON, DANIELSKI. Date of Service: 06/07/2017 3:00 PM Medical Record Number: [id number] Patient Account Number: [id number] Date of Birth/Sex: 03/31/1971 (46 y.o. Male) Treating RN: Cornell Barman Primary Care Richard Holz: Einar Pheasant Other Clinician: Referring Arria Naim: Einar Pheasant Treating Mackenze Grandison/Extender: Frann Rider in Treatment: 12 Visit Information History Since Last Visit Added or deleted any medications: No Patient Arrived: Wheel Chair Any new allergies or adverse reactions: No Arrival Time: 15:08 Had a fall or experienced change in No activities of daily living that may affect Accompanied By: self risk of falls: Transfer Assistance: None Signs or symptoms of abuse/neglect since last No Patient Identification Verified: Yes visito Secondary Verification Process Yes Hospitalized since last visit: No Completed: Has Dressing in Place as Prescribed: Yes Patient Requires Transmission-Based No Pain Present Now: No Precautions: Patient Has Alerts: No Electronic Signature(s) Signed: 06/07/2017 5:28:17 PM By: Gretta Cool, BSN, RN, CWS, Kim RN, BSN Entered By: Gretta Cool, BSN, RN, CWS, Kim on 06/07/2017 15:08:49 Micheletti, [name] ([id number]) -------------------------------------------------------------------------------- Encounter Discharge Information Details Patient Name: [name]. Date of Service: 06/07/2017 3:00 PM Medical Record Number: [id number] Patient Account Number: [id number] Date of Birth/Sex: 09-30-1971 (46 y.o. Male) Treating RN: Cornell Barman Primary Care Michiah Mudry: Einar Pheasant Other Clinician: Referring Brittiny Levitz: Einar Pheasant Treating Morayma Godown/Extender: Frann Rider in Treatment: 12 Encounter Discharge Information Items Discharge Pain Level: 0 Discharge Condition: Stable Ambulatory Status: Wheelchair Discharge Destination: Home Transportation: Private  Auto Accompanied By: self Schedule Follow-up Appointment: Yes Medication Reconciliation completed Yes and provided to Patient/Care Miesha Bachmann: Patient Clinical Summary of Care: Declined Electronic Signature(s) Signed: 06/07/2017 3:51:56 PM By: Ruthine Dose Entered By: Ruthine Dose on 06/07/2017 15:51:56 Mangham, [name] ([id number]) -------------------------------------------------------------------------------- Lower Extremity Assessment Details Patient Name: [name]. Date of Service: 06/07/2017 3:00 PM Medical Record Number: [id number] Patient Account Number: [id number] Date of Birth/Sex: [birth year] (46 y.o. Male) Treating RN: Cornell Barman Primary Care Rodolphe Edmonston: Einar Pheasant Other Clinician: Referring Devaun Hernandez: Einar Pheasant Treating Amanuel Sinkfield/Extender: Frann Rider in Treatment: 12 Vascular Assessment Pulses: Dorsalis Pedis Palpable: [Left:Yes] Posterior Tibial Extremity colors, hair growth, and conditions: Extremity Color: [Left:Pale] Hair Growth on Extremity: [Left:Yes] Temperature of Extremity: [Left:Cool] Capillary Refill: [Left:> 3 seconds] Toe Nail Assessment Left: Right: Thick: No Discolored: No Deformed: No Improper Length and Hygiene: No Electronic Signature(s) Signed: 06/07/2017 5:28:17 PM By: Gretta Cool, BSN, RN, CWS, Kim RN, BSN Entered By: Gretta Cool, BSN, RN, CWS, Kim on 06/07/2017 15:18:49 Sivak, [name] ([id number]) -------------------------------------------------------------------------------- Multi Wound Chart Details Patient Name: [name]. Date of Service: 06/07/2017 3:00 PM Medical Record Number: [id number] Patient Account Number: [id number] Date of Birth/Sex: 20-Apr-1971 (46 y.o. Male) Treating RN: Cornell Barman Primary Care Shylo Zamor: Einar Pheasant Other Clinician: Referring Marcille Barman: Einar Pheasant Treating Monice Lundy/Extender: Frann Rider in Treatment: 12 Vital Signs Height(in): 72 Pulse(bpm): 68 Weight(lbs): 170 Blood  Pressure 117/67 (mmHg): Body Mass Index(BMI): 23 Temperature(F): 97.8 Respiratory Rate 16 (breaths/min): Photos: [4:No Photos] [5:No Photos] [N/A:N/A] Wound Location: [4:Sacrum - Medial] [5:Left Calcaneus] [N/A:N/A] Wounding Event: [4:Surgical Injury] [5:Pressure Injury] [N/A:N/A] Primary Etiology: [4:Open Surgical Wound] [5:Diabetic Wound/Ulcer of the Lower Extremity] [N/A:N/A] Comorbid History: [4:Type I Diabetes, History of N/A pressure wounds, Paraplegia] [N/A:N/A] Date Acquired: [4:10/21/2016] [5:03/15/2017] [N/A:N/A] Weeks of Treatment: [4:12] [5:11] [N/A:N/A] Wound Status: [4:Open] [5:Healed - Epithelialized] [N/A:N/A] Measurements L x W x D 1x1.2x0.1 [5:0x0x0] [N/A:N/A] (cm) Area (cm) : [4:0.942] [5:0] [N/A:N/A] Volume (cm) : [4:0.094] [5:0] [N/A:N/A] % Reduction in Area: [4:84.00%] [5:100.00%] [N/A:N/A] % Reduction in  Volume: 92.00% [5:100.00%] [N/A:N/A] Classification: [4:Full Thickness Without Exposed Support Structures] [5:Grade 1] [N/A:N/A] Exudate Amount: [4:Medium] [5:N/A] [N/A:N/A] Exudate Type: [4:Serous] [5:N/A] [N/A:N/A] Exudate Color: [4:amber] [5:N/A] [N/A:N/A] Wound Margin: [4:Flat and Intact] [5:N/A] [N/A:N/A] Granulation Amount: [4:Large (67-100%)] [5:N/A] [N/A:N/A] Granulation Quality: [4:Red] [5:N/A] [N/A:N/A] Necrotic Amount: [4:Small (1-33%)] [5:N/A] [N/A:N/A] Exposed Structures: [4:Fat Layer (Subcutaneous N/A Tissue) Exposed: Yes Fascia: No] [N/A:N/A] Tendon: No Muscle: No Joint: No Bone: No Epithelialization: Small (1-33%) N/A N/A Debridement: Debridement ([id number]- N/A N/A 11047) Pre-procedure 13:22 N/A N/A Verification/Time Out Taken: Pain Control: Other N/A N/A Tissue Debrided: Fibrin/Slough, Fat, N/A N/A Subcutaneous Level: Skin/Subcutaneous N/A N/A Tissue Debridement Area (sq 1.2 N/A N/A cm): Instrument: Curette N/A N/A Bleeding: Minimum N/A N/A Hemostasis Achieved: Pressure N/A N/A Procedural Pain: 0 N/A N/A Post Procedural  Pain: 0 N/A N/A Debridement Treatment Procedure was tolerated N/A N/A Response: well Post Debridement 1x1.2x0.3 N/A N/A Measurements L x W x D (cm) Post Debridement 0.283 N/A N/A Volume: (cm) Periwound Skin Texture: Scarring: Yes No Abnormalities Noted N/A Excoriation: No Induration: No Callus: No Crepitus: No Rash: No Periwound Skin Maceration: Yes No Abnormalities Noted N/A Moisture: Dry/Scaly: No Periwound Skin Color: Atrophie Blanche: No No Abnormalities Noted N/A Cyanosis: No Ecchymosis: No Erythema: No Hemosiderin Staining: No Mottled: No Pallor: No Rubor: No Tenderness on No No N/A Palpation: Wound Preparation: Ulcer Cleansing: N/A N/A Rinsed/Irrigated with Saline Grunder, [name] ([id number]) Topical Anesthetic Applied: Other: lidocaine 4% Procedures Performed: Debridement N/A N/A Treatment Notes Electronic Signature(s) Signed: 06/07/2017 3:38:44 PM By: Christin Fudge MD, FACS Entered By: Christin Fudge on 06/07/2017 15:38:44 Arment, [name] ([id number]) -------------------------------------------------------------------------------- Cornish Details Patient Name: [name]. Date of Service: 06/07/2017 3:00 PM Medical Record Number: [id number] Patient Account Number: [id number] Date of Birth/Sex: 17-Apr-1971 (46 y.o. Male) Treating RN: Cornell Barman Primary Care Ishmail Mcmanamon: Einar Pheasant Other Clinician: Referring Loie Jahr: Einar Pheasant Treating Micole Delehanty/Extender: Frann Rider in Treatment: 12 Active Inactive ` Orientation to the Wound Care Program Nursing Diagnoses: Knowledge deficit related to the wound healing center program Goals: Patient/caregiver will verbalize understanding of the [facility] Date Initiated: 03/12/2017 Target Resolution Date: 07/12/2017 Goal Status: Active Interventions: Provide education on orientation to the wound center Notes: ` Wound/Skin Impairment Nursing  Diagnoses: Impaired tissue integrity Knowledge deficit related to ulceration/compromised skin integrity Goals: Patient/caregiver will verbalize understanding of skin care regimen Date Initiated: 03/12/2017 Target Resolution Date: 07/12/2017 Goal Status: Active Ulcer/skin breakdown will have a volume reduction of 30% by week 4 Date Initiated: 03/12/2017 Target Resolution Date: 07/12/2017 Goal Status: Active Ulcer/skin breakdown will have a volume reduction of 50% by week 8 Date Initiated: 03/12/2017 Target Resolution Date: 07/12/2017 Goal Status: Active Ulcer/skin breakdown will have a volume reduction of 80% by week 12 Date Initiated: 03/12/2017 Target Resolution Date: 07/12/2017 Goal Status: Active Ulcer/skin breakdown will heal within 14 weeks FRANKY, REIER ([id number]) Date Initiated: 03/12/2017 Target Resolution Date: 07/12/2017 Goal Status: Active Interventions: Assess patient/caregiver ability to obtain necessary supplies Assess patient/caregiver ability to perform ulcer/skin care regimen upon admission and as needed Assess ulceration(s) every visit Provide education on ulcer and skin care Treatment Activities: Referred to DME Cono Gebhard for dressing supplies : 03/12/2017 Skin care regimen initiated : 03/12/2017 Topical wound management initiated : 03/12/2017 Notes: Electronic Signature(s) Signed: 06/07/2017 5:28:17 PM By: Gretta Cool, BSN, RN, CWS, Kim RN, BSN Entered By: Gretta Cool, BSN, RN, CWS, Kim on 06/07/2017 15:27:21 Jansson, [name] ([id number]) -------------------------------------------------------------------------------- Pain Assessment Details Patient Name: [name]. Date of Service:  06/07/2017 3:00 PM Medical Record Number: [id number] Patient Account Number: [id number] Date of Birth/Sex: 04/16/1971 (46 y.o. Male) Treating RN: Cornell Barman Primary Care Xaivier Malay: Einar Pheasant Other Clinician: Referring Demoni Gergen: Einar Pheasant Treating Seneca Hoback/Extender: Frann Rider in  Treatment: 12 Active Problems Location of Pain Severity and Description of Pain Patient Has Paino No Site Locations With Dressing Change: No Pain Management and Medication Current Pain Management: Electronic Signature(s) Signed: 06/07/2017 5:28:17 PM By: Gretta Cool, BSN, RN, CWS, Kim RN, BSN Entered By: Gretta Cool, BSN, RN, CWS, Kim on 06/07/2017 15:08:58 Bakke, [name] ([id number]) -------------------------------------------------------------------------------- Patient/Caregiver Education Details Patient Name: [name]. Date of Service: 06/07/2017 3:00 PM Medical Record Number: [id number] Patient Account Number: [id number] Date of Birth/Gender: 05/01/1971 (46 y.o. Male) Treating RN: Cornell Barman Primary Care Physician: Einar Pheasant Other Clinician: Referring Physician: Einar Pheasant Treating Physician/Extender: Frann Rider in Treatment: 12 Education Assessment Education Provided To: Patient Education Topics Provided Wound/Skin Impairment: Handouts: Caring for Your Ulcer Methods: Demonstration Responses: State content correctly Electronic Signature(s) Signed: 06/07/2017 5:28:17 PM By: Gretta Cool, BSN, RN, CWS, Kim RN, BSN Entered By: Gretta Cool, BSN, RN, CWS, Kim on 06/07/2017 15:49:54 Langelier, [name] ([id number]) -------------------------------------------------------------------------------- Wound Assessment Details Patient Name: [name]. Date of Service: 06/07/2017 3:00 PM Medical Record Number: [id number] Patient Account Number: [id number] Date of Birth/Sex: March 09, 1971 (46 y.o. Male) Treating RN: Cornell Barman Primary Care Tawnia Schirm: Einar Pheasant Other Clinician: Referring Jeziah Kretschmer: Einar Pheasant Treating Maycee Blasco/Extender: Frann Rider in Treatment: 12 Wound Status Wound Number: 4 Primary Open Surgical Wound Etiology: Wound Location: Sacrum - Medial Wound Open Wounding Event: Surgical Injury Status: Date Acquired: 10/21/2016 Comorbid Type I Diabetes,  History of pressure Weeks Of Treatment: 12 History: wounds, Paraplegia Clustered Wound: No Wound Measurements Length: (cm) 1 Width: (cm) 1.2 Depth: (cm) 0.1 Area: (cm) 0.942 Volume: (cm) 0.094 % Reduction in Area: 84% % Reduction in Volume: 92% Epithelialization: Small (1-33%) Tunneling: No Undermining: No Wound Description Full Thickness Without Exposed Classification: Support Structures Wound Margin: Flat and Intact Exudate Medium Amount: Exudate Type: Serous Exudate Color: amber Foul Odor After Cleansing: No Slough/Fibrino Yes Wound Bed Granulation Amount: Large (67-100%) Exposed Structure Granulation Quality: Red Fascia Exposed: No Necrotic Amount: Small (1-33%) Fat Layer (Subcutaneous Tissue) Exposed: Yes Necrotic Quality: Adherent Slough Tendon Exposed: No Muscle Exposed: No Joint Exposed: No Bone Exposed: No Periwound Skin Texture Texture Color No Abnormalities Noted: No No Abnormalities Noted: No Callus: No Atrophie Blanche: No Crepitus: No Cyanosis: No Excoriation: No Ecchymosis: No Awe, [name] ([id number]) Induration: No Erythema: No Rash: No Hemosiderin Staining: No Scarring: Yes Mottled: No Pallor: No Moisture Rubor: No No Abnormalities Noted: No Dry / Scaly: No Maceration: Yes Wound Preparation Ulcer Cleansing: Rinsed/Irrigated with Saline Topical Anesthetic Applied: Other: lidocaine 4%, Treatment Notes Wound #4 (Medial Sacrum) 1. Cleansed with: Clean wound with Normal Saline 2. Anesthetic Topical Lidocaine 4% cream to wound bed prior to debridement 4. Dressing Applied: Other dressing (specify in notes) 5. Secondary Dressing Applied Bordered Foam Dressing Notes antimicrobial Enoform Electronic Signature(s) Signed: 06/07/2017 5:28:17 PM By: Gretta Cool, BSN, RN, CWS, Kim RN, BSN Entered By: Gretta Cool, BSN, RN, CWS, Kim on 06/07/2017 15:38:26 Wolin, [name]  ([id number]) -------------------------------------------------------------------------------- Wound Assessment Details Patient Name: [name]. Date of Service: 06/07/2017 3:00 PM Medical Record Number: [id number] Patient Account Number: [id number] Date of Birth/Sex: Sep 22, 1971 (46 y.o. Male) Treating RN: Cornell Barman Primary Care Kaitlin Ardito: Einar Pheasant Other Clinician: Referring Jullien Granquist: Einar Pheasant Treating Carmelo Reidel/Extender: Frann Rider in Treatment: 12 Wound Status Wound  Number: 5 Primary Diabetic Wound/Ulcer of the Lower Etiology: Extremity Wound Location: Left Calcaneus Wound Status: Healed - Epithelialized Wounding Event: Pressure Injury Date Acquired: 03/15/2017 Weeks Of Treatment: 11 Clustered Wound: No Wound Measurements Length: (cm) 0 % Reduction in Width: (cm) 0 % Reduction in Depth: (cm) 0 Area: (cm) 0 Volume: (cm) 0 Area: 100% Volume: 100% Wound Description Classification: Grade 1 Periwound Skin Texture Texture Color No Abnormalities Noted: No No Abnormalities Noted: No Moisture No Abnormalities Noted: No Electronic Signature(s) Signed: 06/07/2017 5:28:17 PM By: Gretta Cool, BSN, RN, CWS, Kim RN, BSN Entered By: Gretta Cool, BSN, RN, CWS, Kim on 06/07/2017 15:27:51 Chumley, [name] ([id number]) -------------------------------------------------------------------------------- Vitals Details Patient Name: [name]. Date of Service: 06/07/2017 3:00 PM Medical Record Number: [id number] Patient Account Number: [id number] Date of Birth/Sex: 1971-01-25 (46 y.o. Male) Treating RN: Cornell Barman Primary Care Chazz Philson: Einar Pheasant Other Clinician: Referring Laila Myhre: Einar Pheasant Treating Tiny Rietz/Extender: Frann Rider in Treatment: 12 Vital Signs Time Taken: 15:09 Temperature (F): 97.8 Height (in): 72 Pulse (bpm): 68 Weight (lbs): 170 Respiratory Rate (breaths/min): 16 Body Mass Index (BMI): 23.1 Blood Pressure (mmHg):  117/67 Reference Range: 80 - 120 mg / dl Electronic Signature(s) Signed: 06/07/2017 5:28:17 PM By: Gretta Cool, BSN, RN, CWS, Kim RN, BSN Entered By: Gretta Cool, BSN, RN, CWS, Kim on 06/07/2017 15:09:18

## 2017-06-14 ENCOUNTER — Encounter: Payer: Medicare Other | Admitting: Physician Assistant

## 2017-06-14 DIAGNOSIS — E10622 Type 1 diabetes mellitus with other skin ulcer: Secondary | ICD-10-CM | POA: Diagnosis not present

## 2017-06-15 NOTE — Progress Notes (Signed)
[name], [name] ([id number]) Visit Report for 06/14/2017 Arrival Information Details Patient Name: [name], [name]. Date of Service: 06/14/2017 3:00 PM Medical Record Number: [id number] Patient Account Number: [id number] Date of Birth/Sex: 10/20/1971 (46 y.o. Male) Treating RN: Cornell Barman Primary Care Deneane Stifter: Einar Pheasant Other Clinician: Referring Daymian Lill: Einar Pheasant Treating Elisha Mcgruder/Extender: Melburn Hake, HOYT Weeks in Treatment: 13 Visit Information History Since Last Visit Added or deleted any medications: No Patient Arrived: Wheel Chair Any new allergies or adverse reactions: No Arrival Time: 15:09 Had a fall or experienced change in No activities of daily living that may affect Accompanied By: self risk of falls: Transfer Assistance: Manual Signs or symptoms of abuse/neglect since last No Patient Identification Verified: Yes visito Secondary Verification Process Yes Hospitalized since last visit: No Completed: Has Dressing in Place as Prescribed: Yes Patient Requires Transmission-Based No Pain Present Now: No Precautions: Patient Has Alerts: No Electronic Signature(s) Signed: 06/14/2017 5:19:54 PM By: Gretta Cool, BSN, RN, CWS, Kim RN, BSN Entered By: Gretta Cool, BSN, RN, CWS, Kim on 06/14/2017 15:10:41 [place], [name] ([id number]) -------------------------------------------------------------------------------- Clinic Level of Care Assessment Details Patient Name: [name]. Date of Service: 06/14/2017 3:00 PM Medical Record Number: [id number] Patient Account Number: [id number] Date of Birth/Sex: 26-Apr-1971 (46 y.o. Male) Treating RN: Cornell Barman Primary Care Mauri Tolen: Einar Pheasant Other Clinician: Referring Tekisha Darcey: Einar Pheasant Treating Marshella Tello/Extender: Melburn Hake, HOYT Weeks in Treatment: 13 Clinic Level of Care Assessment Items TOOL 4 Quantity Score []  - Use when only an EandM is performed on FOLLOW-UP visit 0 ASSESSMENTS - Nursing Assessment /  Reassessment []  - Reassessment of Co-morbidities (includes updates in patient status) 0 X - Reassessment of Adherence to Treatment Plan 1 5 ASSESSMENTS - Wound and Skin Assessment / Reassessment X - Simple Wound Assessment / Reassessment - one wound 1 5 []  - Complex Wound Assessment / Reassessment - multiple wounds 0 []  - Dermatologic / Skin Assessment (not related to wound area) 0 ASSESSMENTS - Focused Assessment []  - Circumferential Edema Measurements - multi extremities 0 []  - Nutritional Assessment / Counseling / Intervention 0 []  - Lower Extremity Assessment (monofilament, tuning fork, pulses) 0 []  - Peripheral Arterial Disease Assessment (using hand held doppler) 0 ASSESSMENTS - Ostomy and/or Continence Assessment and Care []  - Incontinence Assessment and Management 0 []  - Ostomy Care Assessment and Management (repouching, etc.) 0 PROCESS - Coordination of Care X - Simple Patient / Family Education for ongoing care 1 15 []  - Complex (extensive) Patient / Family Education for ongoing care 0 []  - Staff obtains Programmer, systems, Records, Test Results / Process Orders 0 []  - Staff telephones HHA, Nursing Homes / Clarify orders / etc 0 []  - Routine Transfer to another Facility (non-emergent condition) 0 Hackmann, [name] ([id number]) []  - Routine Hospital Admission (non-emergent condition) 0 []  - New Admissions / Biomedical engineer / Ordering NPWT, Apligraf, etc. 0 []  - Emergency Hospital Admission (emergent condition) 0 X - Simple Discharge Coordination 1 10 []  - Complex (extensive) Discharge Coordination 0 PROCESS - Special Needs []  - Pediatric / Minor Patient Management 0 []  - Isolation Patient Management 0 []  - Hearing / Language / Visual special needs 0 []  - Assessment of Community assistance (transportation, D/C planning, etc.) 0 []  - Additional assistance / Altered mentation 0 []  - Support Surface(s) Assessment (bed, cushion, seat, etc.) 0 INTERVENTIONS - Wound Cleansing /  Measurement X - Simple Wound Cleansing - one wound 1 5 []  - Complex Wound Cleansing - multiple wounds 0 X - Wound Imaging (photographs -  any number of wounds) 1 5 []  - Wound Tracing (instead of photographs) 0 X - Simple Wound Measurement - one wound 1 5 []  - Complex Wound Measurement - multiple wounds 0 INTERVENTIONS - Wound Dressings []  - Small Wound Dressing one or multiple wounds 0 X - Medium Wound Dressing one or multiple wounds 1 15 []  - Large Wound Dressing one or multiple wounds 0 []  - Application of Medications - topical 0 []  - Application of Medications - injection 0 INTERVENTIONS - Miscellaneous []  - External ear exam 0 Hinderman, [name] ([id number]) []  - Specimen Collection (cultures, biopsies, blood, body fluids, etc.) 0 []  - Specimen(s) / Culture(s) sent or taken to Lab for analysis 0 []  - Patient Transfer (multiple staff / Harrel Lemon Lift / Similar devices) 0 []  - Simple Staple / Suture removal (25 or less) 0 []  - Complex Staple / Suture removal (26 or more) 0 []  - Hypo / Hyperglycemic Management (close monitor of Blood Glucose) 0 []  - Ankle / Brachial Index (ABI) - do not check if billed separately 0 X - Vital Signs 1 5 Has the patient been seen at the hospital within the last three years: Yes Total Score: 70 Level Of Care: New/Established - Level 2 Electronic Signature(s) Signed: 06/14/2017 5:19:54 PM By: Gretta Cool, BSN, RN, CWS, Kim RN, BSN Entered By: Gretta Cool, BSN, RN, CWS, Kim on 06/14/2017 [place], [name] ([id number]) -------------------------------------------------------------------------------- Encounter Discharge Information Details Patient Name: [name]. Date of Service: 06/14/2017 3:00 PM Medical Record Number: [id number] Patient Account Number: [id number] Date of Birth/Sex: [birth year] (46 y.o. Male) Treating RN: Cornell Barman Primary Care Rayford Williamsen: Einar Pheasant Other Clinician: Referring Madeline Bebout: Einar Pheasant Treating Sharmaine Bain/Extender: Melburn Hake,  HOYT Weeks in Treatment: 13 Encounter Discharge Information Items Discharge Pain Level: 0 Discharge Condition: Stable Ambulatory Status: Wheelchair Discharge Destination: Home Transportation: Private Auto Accompanied By: self Schedule Follow-up Appointment: Yes Medication Reconciliation completed Yes and provided to Patient/Care Yaniris Braddock: Patient Clinical Summary of Care: Declined Electronic Signature(s) Signed: 06/14/2017 3:52:50 PM By: Ruthine Dose Entered By: Ruthine Dose on 06/14/2017 15:52:50 [place], [name] ([id number]) -------------------------------------------------------------------------------- Lower Extremity Assessment Details Patient Name: [name]. Date of Service: 06/14/2017 3:00 PM Medical Record Number: [id number] Patient Account Number: [id number] Date of Birth/Sex: [birth year] (46 y.o. Male) Treating RN: Cornell Barman Primary Care Norabelle Kondo: Einar Pheasant Other Clinician: Referring Yassir Enis: Einar Pheasant Treating Fusako Tanabe/Extender: Worthy Keeler Weeks in Treatment: 13 Electronic Signature(s) Signed: 06/14/2017 5:19:54 PM By: Gretta Cool, BSN, RN, CWS, Kim RN, BSN Entered By: Gretta Cool, BSN, RN, CWS, Kim on 06/14/2017 15:23:39 [place], [name] ([id number]) -------------------------------------------------------------------------------- Multi Wound Chart Details Patient Name: [name]. Date of Service: 06/14/2017 3:00 PM Medical Record Number: [id number] Patient Account Number: [id number] Date of Birth/Sex: 01-Jun-1971 (46 y.o. Male) Treating RN: Cornell Barman Primary Care Marveen Donlon: Einar Pheasant Other Clinician: Referring Oskar Cretella: Einar Pheasant Treating Julious Langlois/Extender: Melburn Hake, HOYT Weeks in Treatment: 13 Vital Signs Height(in): 72 Pulse(bpm): 63 Weight(lbs): 170 Blood Pressure 96/54 (mmHg): Body Mass Index(BMI): 23 Temperature(F): 98.1 Respiratory Rate 16 (breaths/min): Photos: [N/A:N/A] Wound Location: Sacrum - Medial N/A  N/A Wounding Event: Surgical Injury N/A N/A Primary Etiology: Open Surgical Wound N/A N/A Comorbid History: Type I Diabetes, History of N/A N/A pressure wounds, Paraplegia Date Acquired: 10/21/2016 N/A N/A Weeks of Treatment: 13 N/A N/A Wound Status: Open N/A N/A Measurements L x W x D 0.9x1x0.1 N/A N/A (cm) Area (cm) : 0.707 N/A N/A Volume (cm) : 0.071 N/A N/A % Reduction in Area: 88.00% N/A N/A %  Reduction in Volume: 94.00% N/A N/A Classification: Full Thickness Without N/A N/A Exposed Support Structures Exudate Amount: Medium N/A N/A Exudate Type: Serous N/A N/A Exudate Color: amber N/A N/A Wound Margin: Epibole N/A N/A Granulation Amount: Large (67-100%) N/A N/A Granulation Quality: Red N/A N/A Massey, [name] ([id number]) Necrotic Amount: Small (1-33%) N/A N/A Exposed Structures: Fat Layer (Subcutaneous N/A N/A Tissue) Exposed: Yes Fascia: No Tendon: No Muscle: No Joint: No Bone: No Epithelialization: Small (1-33%) N/A N/A Periwound Skin Texture: Scarring: Yes N/A N/A Excoriation: No Induration: No Callus: No Crepitus: No Rash: No Periwound Skin Maceration: Yes N/A N/A Moisture: Dry/Scaly: No Periwound Skin Color: Atrophie Blanche: No N/A N/A Cyanosis: No Ecchymosis: No Erythema: No Hemosiderin Staining: No Mottled: No Pallor: No Rubor: No Tenderness on No N/A N/A Palpation: Wound Preparation: Ulcer Cleansing: N/A N/A Rinsed/Irrigated with Saline Topical Anesthetic Applied: Other: lidocaine 4% Treatment Notes Wound #4 (Medial Sacrum) 1. Cleansed with: Clean wound with Normal Saline 2. Anesthetic Topical Lidocaine 4% cream to wound bed prior to debridement Notes Enoform, drawtex, Dentist) Signed: 06/14/2017 4:45:20 PM By: Gretta Cool, BSN, RN, CWS, Kim RN, BSN Entered By: Gretta Cool, BSN, RN, CWS, Kim on 06/14/2017 16:45:20 [place], [name] ([id number]) [place], [name]  ([id number]) -------------------------------------------------------------------------------- [place] Details Patient Name: DARCEL, FRANE. Date of Service: 06/14/2017 3:00 PM Medical Record Number: [id number] Patient Account Number: [id number] Date of Birth/Sex: 1971-01-23 (46 y.o. Male) Treating RN: Cornell Barman Primary Care Deunte Bledsoe: Einar Pheasant Other Clinician: Referring Raenette Sakata: Einar Pheasant Treating Jacqulyne Gladue/Extender: Melburn Hake, HOYT Weeks in Treatment: 13 Active Inactive ` Orientation to the Wound Care Program Nursing Diagnoses: Knowledge deficit related to the wound healing center program Goals: Patient/caregiver will verbalize understanding of the [place] Program Date Initiated: 03/12/2017 Target Resolution Date: 07/12/2017 Goal Status: Active Interventions: Provide education on orientation to the wound center Notes: ` Wound/Skin Impairment Nursing Diagnoses: Impaired tissue integrity Knowledge deficit related to ulceration/compromised skin integrity Goals: Patient/caregiver will verbalize understanding of skin care regimen Date Initiated: 03/12/2017 Target Resolution Date: 07/12/2017 Goal Status: Active Ulcer/skin breakdown will have a volume reduction of 30% by week 4 Date Initiated: 03/12/2017 Target Resolution Date: 07/12/2017 Goal Status: Active Ulcer/skin breakdown will have a volume reduction of 50% by week 8 Date Initiated: 03/12/2017 Target Resolution Date: 07/12/2017 Goal Status: Active Ulcer/skin breakdown will have a volume reduction of 80% by week 12 Date Initiated: 03/12/2017 Target Resolution Date: 07/12/2017 Goal Status: Active Ulcer/skin breakdown will heal within 14 weeks CAROL, LOFTIN ([id number]) Date Initiated: 03/12/2017 Target Resolution Date: 07/12/2017 Goal Status: Active Interventions: Assess patient/caregiver ability to obtain necessary supplies Assess patient/caregiver ability to perform ulcer/skin care regimen  upon admission and as needed Assess ulceration(s) every visit Provide education on ulcer and skin care Treatment Activities: Referred to DME Rocquel Askren for dressing supplies : 03/12/2017 Skin care regimen initiated : 03/12/2017 Topical wound management initiated : 03/12/2017 Notes: Electronic Signature(s) Signed: 06/14/2017 5:19:54 PM By: Gretta Cool, BSN, RN, CWS, Kim RN, BSN Entered By: Gretta Cool, BSN, RN, CWS, Kim on 06/14/2017 15:23:46 [place], [name] ([id number]) -------------------------------------------------------------------------------- Pain Assessment Details Patient Name: [name]. Date of Service: 06/14/2017 3:00 PM Medical Record Number: [id number] Patient Account Number: [id number] Date of Birth/Sex: 03/18/1971 (46 y.o. Male) Treating RN: Cornell Barman Primary Care Zamantha Strebel: Einar Pheasant Other Clinician: Referring Saliha Salts: Einar Pheasant Treating Geroge Gilliam/Extender: Melburn Hake, HOYT Weeks in Treatment: 13 Active Problems Location of Pain Severity and Description of Pain Patient Has Paino No Site Locations With Dressing Change: No  Pain Management and Medication Current Pain Management: Electronic Signature(s) Signed: 06/14/2017 5:19:54 PM By: Gretta Cool, BSN, RN, CWS, Kim RN, BSN Entered By: Gretta Cool, BSN, RN, CWS, Kim on 06/14/2017 15:11:03 [place], [name] ([id number]) -------------------------------------------------------------------------------- Patient/Caregiver Education Details Patient Name: [name]. Date of Service: 06/14/2017 3:00 PM Medical Record Number: [id number] Patient Account Number: [id number] Date of Birth/Gender: 28-Nov-1971 (46 y.o. Male) Treating RN: Cornell Barman Primary Care Physician: Einar Pheasant Other Clinician: Referring Physician: Einar Pheasant Treating Physician/Extender: Sharalyn Ink in Treatment: 13 Education Assessment Education Provided To: Patient Education Topics Provided Wound/Skin Impairment: Handouts: Caring for Your  Ulcer Methods: Demonstration, Explain/Verbal Responses: State content correctly Electronic Signature(s) Signed: 06/14/2017 5:19:54 PM By: Gretta Cool, BSN, RN, CWS, Kim RN, BSN Entered By: Gretta Cool, BSN, RN, CWS, Kim on 06/14/2017 15:51:41 [place], [name] ([id number]) -------------------------------------------------------------------------------- Wound Assessment Details Patient Name: [name]. Date of Service: 06/14/2017 3:00 PM Medical Record Number: [id number] Patient Account Number: [id number] Date of Birth/Sex: [birth year] (46 y.o. Male) Treating RN: Cornell Barman Primary Care Lille Karim: Einar Pheasant Other Clinician: Referring Kristoffer Bala: Einar Pheasant Treating Rhys Anchondo/Extender: Melburn Hake, HOYT Weeks in Treatment: 13 Wound Status Wound Number: 4 Primary Open Surgical Wound Etiology: Wound Location: Sacrum - Medial Wound Open Wounding Event: Surgical Injury Status: Date Acquired: 10/21/2016 Comorbid Type I Diabetes, History of pressure Weeks Of Treatment: 13 History: wounds, Paraplegia Clustered Wound: No Photos Wound Measurements Length: (cm) 0.9 Width: (cm) 1 Depth: (cm) 0.1 Area: (cm) 0.707 Volume: (cm) 0.071 % Reduction in Area: 88% % Reduction in Volume: 94% Epithelialization: Small (1-33%) Tunneling: No Undermining: No Wound Description Full Thickness Without Exposed Classification: Support Structures Wound Margin: Epibole Exudate Medium Amount: Exudate Type: Serous Exudate Color: amber Foul Odor After Cleansing: No Slough/Fibrino Yes Wound Bed Granulation Amount: Large (67-100%) Exposed Structure Granulation Quality: Red Fascia Exposed: No Necrotic Amount: Small (1-33%) Fat Layer (Subcutaneous Tissue) Exposed: Yes Necrotic Quality: Adherent Slough Tendon Exposed: No Muscle Exposed: No Joint Exposed: No [place], [name] ([id number]) Bone Exposed: No Periwound Skin Texture Texture Color No Abnormalities Noted: No No Abnormalities Noted:  No Callus: No Atrophie Blanche: No Crepitus: No Cyanosis: No Excoriation: No Ecchymosis: No Induration: No Erythema: No Rash: No Hemosiderin Staining: No Scarring: Yes Mottled: No Pallor: No Moisture Rubor: No No Abnormalities Noted: No Dry / Scaly: No Maceration: Yes Wound Preparation Ulcer Cleansing: Rinsed/Irrigated with Saline Topical Anesthetic Applied: Other: lidocaine 4%, Treatment Notes Wound #4 (Medial Sacrum) 1. Cleansed with: Clean wound with Normal Saline 2. Anesthetic Topical Lidocaine 4% cream to wound bed prior to debridement Notes Enoform, drawtex, Dentist) Signed: 06/14/2017 5:19:54 PM By: Gretta Cool, BSN, RN, CWS, Kim RN, BSN Entered By: Gretta Cool, BSN, RN, CWS, Kim on 06/14/2017 15:22:08 [place], [name] ([id number]) -------------------------------------------------------------------------------- Vitals Details Patient Name: [name]. Date of Service: 06/14/2017 3:00 PM Medical Record Number: [id number] Patient Account Number: [id number] Date of Birth/Sex: Dec 09, 1970 (46 y.o. Male) Treating RN: Cornell Barman Primary Care Milton Sagona: Einar Pheasant Other Clinician: Referring Lorilee Cafarella: Einar Pheasant Treating Jonte Wollam/Extender: Melburn Hake, HOYT Weeks in Treatment: 13 Vital Signs Time Taken: 15:11 Temperature (F): 98.1 Height (in): 72 Pulse (bpm): 63 Weight (lbs): 170 Respiratory Rate (breaths/min): 16 Body Mass Index (BMI): 23.1 Blood Pressure (mmHg): 96/54 Reference Range: 80 - 120 mg / dl Electronic Signature(s) Signed: 06/14/2017 5:19:54 PM By: Gretta Cool, BSN, RN, CWS, Kim RN, BSN Entered By: Gretta Cool, BSN, RN, CWS, Kim on 06/14/2017 15:11:22
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
## 2017-06-21 ENCOUNTER — Encounter: Payer: Medicare Other | Admitting: Physician Assistant

## 2017-06-21 DIAGNOSIS — E10622 Type 1 diabetes mellitus with other skin ulcer: Secondary | ICD-10-CM | POA: Diagnosis not present
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
## 2017-06-22 NOTE — Progress Notes (Signed)
[name] ([id number]) Visit Report for 06/21/2017 Arrival Information Details Patient Name: [name], [name]. Date of Service: 06/21/2017 2:30 PM Medical Record Number: [id number] Patient Account Number: [id number] Date of Birth/Sex: 20-Jun-1971 (46 y.o. Male) Treating RN: [name] Primary Care [name]: [name] Other Clinician: Referring [name]: [name] Treating [name]/Extender: [name], [name] Weeks in Treatment: 14 Visit Information History Since Last Visit Added or deleted any medications: No Patient Arrived: Wheel Chair Any new allergies or adverse reactions: No Arrival Time: 14:34 Had a fall or experienced change in No activities of daily living that may affect Accompanied By: self risk of falls: Transfer Assistance: None Hospitalized since last visit: No Patient Identification Verified: Yes Has Dressing in Place as Prescribed: Yes Secondary Verification Process Yes Pain Present Now: No Completed: Patient Requires Transmission-Based No Precautions: Patient Has Alerts: No Electronic Signature(s) Signed: 06/21/2017 4:48:49 PM By: [name], BSN, RN, CWS, Kim RN, BSN Entered By: [name], BSN, RN, CWS, [name] on 06/21/2017 14:34:34 [name] ([id number]) -------------------------------------------------------------------------------- Clinic Level of Care Assessment Details Patient Name: [name] Girt. Date of Service: 06/21/2017 2:30 PM Medical Record Number: [id number] Patient Account Number: [id number] Date of Birth/Sex: 05/03/1971 (46 y.o. Male) Treating RN: [name] Primary Care Deshon Hsiao: [name] Other Clinician: Referring [name]: [name] Treating [name]/Extender: [name], [name] Weeks in Treatment: 14 Clinic Level of Care Assessment Items TOOL 4 Quantity Score []  - Use when only an EandM is performed on FOLLOW-UP visit 0 ASSESSMENTS - Nursing Assessment / Reassessment []  - Reassessment of Co-morbidities (includes  updates in patient status) 0 X - Reassessment of Adherence to Treatment Plan 1 5 ASSESSMENTS - Wound and Skin Assessment / Reassessment X - Simple Wound Assessment / Reassessment - one wound 1 5 []  - Complex Wound Assessment / Reassessment - multiple wounds 0 []  - Dermatologic / Skin Assessment (not related to wound area) 0 ASSESSMENTS - Focused Assessment []  - Circumferential Edema Measurements - multi extremities 0 []  - Nutritional Assessment / Counseling / Intervention 0 []  - Lower Extremity Assessment (monofilament, tuning fork, pulses) 0 []  - Peripheral Arterial Disease Assessment (using hand held doppler) 0 ASSESSMENTS - Ostomy and/or Continence Assessment and Care []  - Incontinence Assessment and Management 0 []  - Ostomy Care Assessment and Management (repouching, etc.) 0 PROCESS - Coordination of Care X - Simple Patient / Family Education for ongoing care 1 15 []  - Complex (extensive) Patient / Family Education for ongoing care 0 []  - Staff obtains Programmer, systems, Records, Test Results / Process Orders 0 []  - Staff telephones HHA, Nursing Homes / Clarify orders / etc 0 []  - Routine Transfer to another Facility (non-emergent condition) 0 [name], [name] ([id number]) []  - Routine Hospital Admission (non-emergent condition) 0 []  - New Admissions / Biomedical engineer / Ordering NPWT, Apligraf, etc. 0 []  - Emergency Hospital Admission (emergent condition) 0 X - Simple Discharge Coordination 1 10 []  - Complex (extensive) Discharge Coordination 0 PROCESS - Special Needs []  - Pediatric / Minor Patient Management 0 []  - Isolation Patient Management 0 []  - Hearing / Language / Visual special needs 0 []  - Assessment of Community assistance (transportation, D/C planning, etc.) 0 []  - Additional assistance / Altered mentation 0 []  - Support Surface(s) Assessment (bed, cushion, seat, etc.) 0 INTERVENTIONS - Wound Cleansing / Measurement X - Simple Wound Cleansing - one wound 1 5 []  -  Complex Wound Cleansing - multiple wounds 0 X - Wound Imaging (photographs - any number of wounds) 1 5 []  -  Wound Tracing (instead of photographs) 0 X - Simple Wound Measurement - one wound 1 5 []  - Complex Wound Measurement - multiple wounds 0 INTERVENTIONS - Wound Dressings []  - Small Wound Dressing one or multiple wounds 0 X - Medium Wound Dressing one or multiple wounds 1 15 []  - Large Wound Dressing one or multiple wounds 0 []  - Application of Medications - topical 0 []  - Application of Medications - injection 0 INTERVENTIONS - Miscellaneous []  - External ear exam 0 [name], [name] ([id number]) []  - Specimen Collection (cultures, biopsies, blood, body fluids, etc.) 0 []  - Specimen(s) / Culture(s) sent or taken to Lab for analysis 0 []  - Patient Transfer (multiple staff / [name] / Similar devices) 0 []  - Simple Staple / Suture removal (25 or less) 0 []  - Complex Staple / Suture removal (26 or more) 0 []  - Hypo / Hyperglycemic Management (close monitor of Blood Glucose) 0 []  - Ankle / Brachial Index (ABI) - do not check if billed separately 0 X - Vital Signs 1 5 Has the patient been seen at the hospital within the last three years: Yes Total Score: 70 Level Of Care: New/Established - Level 2 Electronic Signature(s) Signed: 06/21/2017 4:48:49 PM By: [name], BSN, RN, CWS, Kim RN, BSN Entered By: [name], BSN, RN, CWS, [name] on 06/21/2017 14:50:25 [name] ([id number]) -------------------------------------------------------------------------------- Encounter Discharge Information Details Patient Name: [name] Girt. Date of Service: 06/21/2017 2:30 PM Medical Record Number: [id number] Patient Account Number: [id number] Date of Birth/Sex: 04-01-1971 (46 y.o. Male) Treating RN: [name] Primary Care [name]: [name] Other Clinician: Referring [name]: [name] Treating [name]/Extender: [name], [name] Weeks in Treatment: 14 Encounter Discharge  Information Items Discharge Pain Level: 0 Discharge Condition: Stable Ambulatory Status: Wheelchair Discharge Destination: Home Transportation: Private Auto Accompanied By: self Schedule Follow-up Appointment: Yes Medication Reconciliation completed Yes and provided to Patient/Care Hawk Mones: Patient Clinical Summary of Care: Declined Electronic Signature(s) Signed: 06/21/2017 4:48:49 PM By: [name], BSN, RN, CWS, Kim RN, BSN Previous Signature: 06/21/2017 3:04:49 PM Version By: Ruthine Dose Entered By: [name] BSN, RN, CWS, [name] on 06/21/2017 15:05:19 Knittel, [name] ([id number]) -------------------------------------------------------------------------------- Lower Extremity Assessment Details Patient Name: [name] Girt. Date of Service: 06/21/2017 2:30 PM Medical Record Number: [id number] Patient Account Number: [id number] Date of Birth/Sex: 1971-03-02 (46 y.o. Male) Treating RN: [name] Primary Care Sivan Quast: [name] Other Clinician: Referring Levana Minetti: [name] Treating Claudis Giovanelli/Extender: Worthy Keeler Weeks in Treatment: 14 Electronic Signature(s) Signed: 06/21/2017 4:48:49 PM By: [name], BSN, RN, CWS, Kim RN, BSN Entered By: [name], BSN, RN, CWS, [name] on 06/21/2017 14:41:21 [place], [name] ([id number]) -------------------------------------------------------------------------------- Multi Wound Chart Details Patient Name: [name] Girt. Date of Service: 06/21/2017 2:30 PM Medical Record Number: [id number] Patient Account Number: [id number] Date of Birth/Sex: 1971-09-30 (46 y.o. Male) Treating RN: [name] Primary Care Demetries Coia: [name] Other Clinician: Referring Carylon Tamburro: [name] Treating Tharon Bomar/Extender: [name], [name] Weeks in Treatment: 14 Vital Signs Height(in): 72 Pulse(bpm): 72 Weight(lbs): 170 Blood Pressure 140/90 (mmHg): Body Mass Index(BMI): 23 Temperature(F): 97.7 Respiratory Rate 16 (breaths/min): Photos:  [N/A:N/A] Wound Location: Sacrum - Medial N/A N/A Wounding Event: Surgical Injury N/A N/A Primary Etiology: Open Surgical Wound N/A N/A Comorbid History: Type I Diabetes, History of N/A N/A pressure wounds, Paraplegia Date Acquired: 10/21/2016 N/A N/A Weeks of Treatment: 14 N/A N/A Wound Status: Open N/A N/A Measurements L x W x D 0.9x0.9x0.1 N/A N/A (cm) Area (cm) : 0.636 N/A N/A Volume (cm) : 0.064 N/A  N/A % Reduction in Area: 89.20% N/A N/A % Reduction in Volume: 94.60% N/A N/A Classification: Full Thickness Without N/A N/A Exposed Support Structures Exudate Amount: Medium N/A N/A Exudate Type: Serous N/A N/A Exudate Color: amber N/A N/A Wound Margin: Epibole N/A N/A Granulation Amount: Large (67-100%) N/A N/A Granulation Quality: Red N/A N/A Tourangeau, [name] ([id number]) Necrotic Amount: Small (1-33%) N/A N/A Exposed Structures: Fat Layer (Subcutaneous N/A N/A Tissue) Exposed: Yes Fascia: No Tendon: No Muscle: No Joint: No Bone: No Epithelialization: Small (1-33%) N/A N/A Periwound Skin Texture: Scarring: Yes N/A N/A Excoriation: No Induration: No Callus: No Crepitus: No Rash: No Periwound Skin Maceration: Yes N/A N/A Moisture: Dry/Scaly: No Periwound Skin Color: Atrophie Blanche: No N/A N/A Cyanosis: No Ecchymosis: No Erythema: No Hemosiderin Staining: No Mottled: No Pallor: No Rubor: No Tenderness on No N/A N/A Palpation: Wound Preparation: Ulcer Cleansing: N/A N/A Rinsed/Irrigated with Saline Topical Anesthetic Applied: Other: lidocaine 4% Treatment Notes Electronic Signature(s) Signed: 06/21/2017 4:48:49 PM By: [name], BSN, RN, CWS, Kim RN, BSN Entered By: [name], BSN, RN, CWS, [name] on 06/21/2017 14:43:17 Decaprio, [name] ([id number]) -------------------------------------------------------------------------------- [employer] Details Patient Name: [name] Girt. Date of Service: 06/21/2017 2:30 PM Medical Record Number:  [id number] Patient Account Number: [id number] Date of Birth/Sex: 1971/01/11 (47 y.o. Male) Treating RN: [name] Primary Care Bacilio Abascal: [name] Other Clinician: Referring Naftula Donahue: [name] Treating Cecelia Graciano/Extender: [name], [name] Weeks in Treatment: 14 Active Inactive ` Orientation to the Wound Care Program Nursing Diagnoses: Knowledge deficit related to the wound healing center program Goals: Patient/caregiver will verbalize understanding of the [place] Program Date Initiated: 03/12/2017 Target Resolution Date: 07/12/2017 Goal Status: Active Interventions: Provide education on orientation to the wound center Notes: ` Wound/Skin Impairment Nursing Diagnoses: Impaired tissue integrity Knowledge deficit related to ulceration/compromised skin integrity Goals: Patient/caregiver will verbalize understanding of skin care regimen Date Initiated: 03/12/2017 Target Resolution Date: 07/12/2017 Goal Status: Active Ulcer/skin breakdown will have a volume reduction of 30% by week 4 Date Initiated: 03/12/2017 Target Resolution Date: 07/12/2017 Goal Status: Active Ulcer/skin breakdown will have a volume reduction of 50% by week 8 Date Initiated: 03/12/2017 Target Resolution Date: 07/12/2017 Goal Status: Active Ulcer/skin breakdown will have a volume reduction of 80% by week 12 Date Initiated: 03/12/2017 Target Resolution Date: 07/12/2017 Goal Status: Active Ulcer/skin breakdown will heal within 14 weeks [name], [name] ([id number]) Date Initiated: 03/12/2017 Target Resolution Date: 07/12/2017 Goal Status: Active Interventions: Assess patient/caregiver ability to obtain necessary supplies Assess patient/caregiver ability to perform ulcer/skin care regimen upon admission and as needed Assess ulceration(s) every visit Provide education on ulcer and skin care Treatment Activities: Referred to DME Taher Vannote for dressing supplies : 03/12/2017 Skin care regimen initiated :  03/12/2017 Topical wound management initiated : 03/12/2017 Notes: Electronic Signature(s) Signed: 06/21/2017 4:48:49 PM By: [name], BSN, RN, CWS, Kim RN, BSN Entered By: [name], BSN, RN, CWS, [name] on 06/21/2017 14:43:06 Basher, [name] ([id number]) -------------------------------------------------------------------------------- Pain Assessment Details Patient Name: [name] Girt. Date of Service: 06/21/2017 2:30 PM Medical Record Number: [id number] Patient Account Number: [id number] Date of Birth/Sex: 06-01-1971 (46 y.o. Male) Treating RN: [name] Primary Care Christphor Groft: [name] Other Clinician: Referring Lanessa Shill: [name] Treating Yarethzi Branan/Extender: [name], [name] Weeks in Treatment: 14 Active Problems Location of Pain Severity and Description of Pain Patient Has Paino No Site Locations With Dressing Change: No Pain Management and Medication Current Pain Management: Electronic Signature(s) Signed: 06/21/2017 4:48:49 PM By: [name], BSN, RN, CWS, Kim RN, BSN Entered By:  [name], BSN, RN, CWS, [name] on 06/21/2017 14:34:43 Martorelli, [name] ([id number]) -------------------------------------------------------------------------------- Patient/Caregiver Education Details Patient Name: [name] Girt. Date of Service: 06/21/2017 2:30 PM Medical Record Number: [id number] Patient Account Number: [id number] Date of Birth/Gender: 09-13-1971 (46 y.o. Male) Treating RN: [name] Primary Care Physician: [name] Other Clinician: Referring Physician: Einar [name] Treating Physician/Extender: Sharalyn Ink in Treatment: 14 Education Assessment Education Provided To: Patient Education Topics Provided Wound/Skin Impairment: Handouts: Caring for Your Ulcer, Other: wound care as prescribed Methods: Demonstration Responses: State content correctly Electronic Signature(s) Signed: 06/21/2017 4:48:49 PM By: [name], BSN, RN, CWS, Kim RN, BSN Entered By: [name], BSN, RN,  CWS, [name] on 06/21/2017 15:06:34 [place], [name] ([id number]) -------------------------------------------------------------------------------- Wound Assessment Details Patient Name: [name] Girt. Date of Service: 06/21/2017 2:30 PM Medical Record Number: [id number] Patient Account Number: [id number] Date of Birth/Sex: 1971-04-01 (46 y.o. Male) Treating RN: [name] Primary Care Jacorion Klem: [name] Other Clinician: Referring Karne Ozga: [name] Treating Rock Sobol/Extender: [name], [name] Weeks in Treatment: 14 Wound Status Wound Number: 4 Primary Open Surgical Wound Etiology: Wound Location: Sacrum - Medial Wound Open Wounding Event: Surgical Injury Status: Date Acquired: 10/21/2016 Comorbid Type I Diabetes, History of pressure Weeks Of Treatment: 14 History: wounds, Paraplegia Clustered Wound: No Photos Photo Uploaded By: [name], BSN, RN, CWS, [name] on 06/21/2017 14:42:49 Wound Measurements Length: (cm) 0.9 Width: (cm) 0.9 Depth: (cm) 0.1 Area: (cm) 0.636 Volume: (cm) 0.064 % Reduction in Area: 89.2% % Reduction in Volume: 94.6% Epithelialization: Small (1-33%) Tunneling: No Undermining: No Wound Description Full Thickness Without Exposed Classification: Support Structures Wound Margin: Epibole Exudate Medium Amount: Exudate Type: Serous Exudate Color: amber Foul Odor After Cleansing: No Slough/Fibrino Yes Wound Bed Granulation Amount: Large (67-100%) Exposed Structure Granulation Quality: Red Fascia Exposed: No Necrotic Amount: Small (1-33%) Fat Layer (Subcutaneous Tissue) Exposed: Yes Necrotic Quality: Adherent Slough Tendon Exposed: No Muscle Exposed: No Freeze, [name] ([id number]) Joint Exposed: No Bone Exposed: No Periwound Skin Texture Texture Color No Abnormalities Noted: No No Abnormalities Noted: No Callus: No Atrophie Blanche: No Crepitus: No Cyanosis: No Excoriation: No Ecchymosis: No Induration: No Erythema:  No Rash: No Hemosiderin Staining: No Scarring: Yes Mottled: No Pallor: No Moisture Rubor: No No Abnormalities Noted: No Dry / Scaly: No Maceration: Yes Wound Preparation Ulcer Cleansing: Rinsed/Irrigated with Saline Topical Anesthetic Applied: Other: lidocaine 4%, Treatment Notes Wound #4 (Medial Sacrum) 1. Cleansed with: Clean wound with Normal Saline 2. Anesthetic Topical Lidocaine 4% cream to wound bed prior to debridement 4. Dressing Applied: Other dressing (specify in notes) Notes Enoform, drawtex, allevyn border Electronic Signature(s) Signed: 06/21/2017 4:48:49 PM By: [name], BSN, RN, CWS, Kim RN, BSN Entered By: [name], BSN, RN, CWS, [name] on 06/21/2017 14:41:03 [place], [name] ([id number]) -------------------------------------------------------------------------------- Vitals Details Patient Name: [name] Girt. Date of Service: 06/21/2017 2:30 PM Medical Record Number: [id number] Patient Account Number: [id number] Date of Birth/Sex: 13-May-1971 (46 y.o. Male) Treating RN: [name] Primary Care Sanda Dejoy: [name] Other Clinician: Referring Jayanth Szczesniak: [name] Treating Eugean Arnott/Extender: [name], [name] Weeks in Treatment: 14 Vital Signs Time Taken: 14:34 Temperature (F): 97.7 Height (in): 72 Pulse (bpm): 72 Weight (lbs): 170 Respiratory Rate (breaths/min): 16 Body Mass Index (BMI): 23.1 Blood Pressure (mmHg): 140/90 Reference Range: 80 - 120 mg / dl Electronic Signature(s) Signed: 06/21/2017 4:48:49 PM By: [name], BSN, RN, CWS, Kim RN, BSN Entered By: [name], BSN, RN, CWS, [name] on 06/21/2017 14:35:41

## 2017-06-28 ENCOUNTER — Encounter: Payer: Medicare Other | Admitting: Surgery

## 2017-06-28 DIAGNOSIS — E10622 Type 1 diabetes mellitus with other skin ulcer: Secondary | ICD-10-CM | POA: Diagnosis not present
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
## 2017-06-30 NOTE — Progress Notes (Signed)
MILOS, MILLIGAN ([id number]) Visit Report for 06/28/2017 Arrival Information Details Patient Name: MAXUM, CASSARINO. Date of Service: 06/28/2017 3:00 PM Medical Record Number: [id number] Patient Account Number: [id number] Date of Birth/Sex: 01/08/1971 (46 y.o. Male) Treating RN: Cornell Barman Primary Care Tonie Vizcarrondo: Einar Pheasant Other Clinician: Referring Coen Miyasato: Einar Pheasant Treating Safari Cinque/Extender: Frann Rider in Treatment: 15 Visit Information History Since Last Visit Added or deleted any medications: No Patient Arrived: Wheel Chair Any new allergies or adverse reactions: No Arrival Time: 14:56 Had a fall or experienced change in No activities of daily living that may affect Accompanied By: self risk of falls: Transfer Assistance: None Signs or symptoms of abuse/neglect since last No Patient Identification Verified: Yes visito Secondary Verification Process Yes Hospitalized since last visit: No Completed: Has Dressing in Place as Prescribed: Yes Patient Requires Transmission-Based No Pain Present Now: No Precautions: Patient Has Alerts: No Electronic Signature(s) Signed: 06/29/2017 12:24:00 PM By: Gretta Cool, BSN, RN, CWS, Kim RN, BSN Entered By: Gretta Cool, BSN, RN, CWS, Kim on 06/28/2017 14:56:35 Tacey, [name] ([id number]) -------------------------------------------------------------------------------- Clinic Level of Care Assessment Details Patient Name: [name]. Date of Service: 06/28/2017 3:00 PM Medical Record Number: [id number] Patient Account Number: [id number] Date of Birth/Sex: Feb 14, 1971 (46 y.o. Male) Treating RN: Cornell Barman Primary Care Jameer Storie: Einar Pheasant Other Clinician: Referring Akshay Spang: Einar Pheasant Treating Asheton Scheffler/Extender: Frann Rider in Treatment: 15 Clinic Level of Care Assessment Items TOOL 4 Quantity Score []  - Use when only an EandM is performed on FOLLOW-UP visit 0 ASSESSMENTS - Nursing Assessment /  Reassessment []  - Reassessment of Co-morbidities (includes updates in patient status) 0 X - Reassessment of Adherence to Treatment Plan 1 5 ASSESSMENTS - Wound and Skin Assessment / Reassessment X - Simple Wound Assessment / Reassessment - one wound 1 5 []  - Complex Wound Assessment / Reassessment - multiple wounds 0 []  - Dermatologic / Skin Assessment (not related to wound area) 0 ASSESSMENTS - Focused Assessment []  - Circumferential Edema Measurements - multi extremities 0 []  - Nutritional Assessment / Counseling / Intervention 0 []  - Lower Extremity Assessment (monofilament, tuning fork, pulses) 0 []  - Peripheral Arterial Disease Assessment (using hand held doppler) 0 ASSESSMENTS - Ostomy and/or Continence Assessment and Care []  - Incontinence Assessment and Management 0 []  - Ostomy Care Assessment and Management (repouching, etc.) 0 PROCESS - Coordination of Care X - Simple Patient / Family Education for ongoing care 1 15 []  - Complex (extensive) Patient / Family Education for ongoing care 0 []  - Staff obtains Programmer, systems, Records, Test Results / Process Orders 0 []  - Staff telephones HHA, Nursing Homes / Clarify orders / etc 0 []  - Routine Transfer to another Facility (non-emergent condition) 0 Sallade, [name] ([id number]) []  - Routine Hospital Admission (non-emergent condition) 0 []  - New Admissions / Biomedical engineer / Ordering NPWT, Apligraf, etc. 0 []  - Emergency Hospital Admission (emergent condition) 0 X - Simple Discharge Coordination 1 10 []  - Complex (extensive) Discharge Coordination 0 PROCESS - Special Needs []  - Pediatric / Minor Patient Management 0 []  - Isolation Patient Management 0 []  - Hearing / Language / Visual special needs 0 []  - Assessment of Community assistance (transportation, D/C planning, etc.) 0 []  - Additional assistance / Altered mentation 0 []  - Support Surface(s) Assessment (bed, cushion, seat, etc.) 0 INTERVENTIONS - Wound Cleansing /  Measurement X - Simple Wound Cleansing - one wound 1 5 []  - Complex Wound Cleansing - multiple wounds 0 X - Wound Imaging (photographs - any  number of wounds) 1 5 []  - Wound Tracing (instead of photographs) 0 X - Simple Wound Measurement - one wound 1 5 []  - Complex Wound Measurement - multiple wounds 0 INTERVENTIONS - Wound Dressings X - Small Wound Dressing one or multiple wounds 1 10 []  - Medium Wound Dressing one or multiple wounds 0 []  - Large Wound Dressing one or multiple wounds 0 []  - Application of Medications - topical 0 []  - Application of Medications - injection 0 INTERVENTIONS - Miscellaneous []  - External ear exam 0 Burrough, [name] ([id number]) []  - Specimen Collection (cultures, biopsies, blood, body fluids, etc.) 0 []  - Specimen(s) / Culture(s) sent or taken to Lab for analysis 0 []  - Patient Transfer (multiple staff / Harrel Lemon Lift / Similar devices) 0 []  - Simple Staple / Suture removal (25 or less) 0 []  - Complex Staple / Suture removal (26 or more) 0 []  - Hypo / Hyperglycemic Management (close monitor of Blood Glucose) 0 []  - Ankle / Brachial Index (ABI) - do not check if billed separately 0 X - Vital Signs 1 5 Has the patient been seen at the hospital within the last three years: Yes Total Score: 65 Level Of Care: New/Established - Level 2 Electronic Signature(s) Signed: 06/29/2017 12:24:00 PM By: Gretta Cool, BSN, RN, CWS, Kim RN, BSN Entered By: Gretta Cool, BSN, RN, CWS, Kim on 06/28/2017 17:05:45 [place], [name] ([id number]) -------------------------------------------------------------------------------- Encounter Discharge Information Details Patient Name: [name]. Date of Service: 06/28/2017 3:00 PM Medical Record Number: [id number] Patient Account Number: [id number] Date of Birth/Sex: Aug 23, 1971 (46 y.o. Male) Treating RN: Cornell Barman Primary Care Deysha Cartier: Einar Pheasant Other Clinician: Referring Christeena Krogh: Einar Pheasant Treating Kathryn Linarez/Extender: Frann Rider in Treatment: 15 Encounter Discharge Information Items Discharge Pain Level: 0 Discharge Condition: Stable Ambulatory Status: Wheelchair Discharge Destination: Home Private Transportation: Auto Accompanied By: self Schedule Follow-up Appointment: Yes Medication Reconciliation completed and Yes provided to Patient/Care Cherlyn Syring: Clinical Summary of Care: Electronic Signature(s) Signed: 06/28/2017 5:06:51 PM By: Gretta Cool, BSN, RN, CWS, Kim RN, BSN Entered By: Gretta Cool, BSN, RN, CWS, Kim on 06/28/2017 17:06:50 [place], [name] ([id number]) -------------------------------------------------------------------------------- General Visit Notes Details Patient Name: [name]. Date of Service: 06/28/2017 3:00 PM Medical Record Number: [id number] Patient Account Number: [id number] Date of Birth/Sex: [birth year] (46 y.o. Male) Treating RN: Cornell Barman Primary Care Erwin Nishiyama: Einar Pheasant Other Clinician: Referring Suetta Hoffmeister: Einar Pheasant Treating Darcella Shiffman/Extender: Frann Rider in Treatment: 15 Notes Patient states he had cortisone shots in his hand and shoulder on Tuesday of last week. Patient states blood sugars have been crazy. Have been as high as 575. Electronic Signature(s) Signed: 06/29/2017 12:24:00 PM By: Gretta Cool, BSN, RN, CWS, Kim RN, BSN Entered By: Gretta Cool, BSN, RN, CWS, Kim on 06/28/2017 14:59:55 [place], [name] ([id number]) -------------------------------------------------------------------------------- Lower Extremity Assessment Details Patient Name: CHUKWUMA, STRAUS. Date of Service: 06/28/2017 3:00 PM Medical Record Number: [id number] Patient Account Number: [id number] Date of Birth/Sex: [birth year] (46 y.o. Male) Treating RN: Cornell Barman Primary Care Tarez Bowns: Einar Pheasant Other Clinician: Referring Kennth Vanbenschoten: Einar Pheasant Treating Therin Vetsch/Extender: Frann Rider in Treatment: 15 Electronic Signature(s) Signed: 06/29/2017 12:24:00 PM By:  Gretta Cool, BSN, RN, CWS, Kim RN, BSN Entered By: Gretta Cool, BSN, RN, CWS, Kim on 06/28/2017 15:03:47 [place], [name] ([id number]) -------------------------------------------------------------------------------- Multi Wound Chart Details Patient Name: [name]. Date of Service: 06/28/2017 3:00 PM Medical Record Number: [id number] Patient Account Number: [id number] Date of Birth/Sex: [birth year] (46 y.o. Male) Treating RN: Cornell Barman Primary Care Romesha Scherer: Einar Pheasant Other Clinician:  Referring Talasia Saulter: Einar Pheasant Treating Adaja Wander/Extender: Frann Rider in Treatment: 15 Vital Signs Height(in): 72 Pulse(bpm): 61 Weight(lbs): 170 Blood Pressure 209/125 (mmHg): Body Mass Index(BMI): 23 Temperature(F): 98.4 Respiratory Rate 16 (breaths/min): Photos: [N/A:N/A] Wound Location: Sacrum - Medial N/A N/A Wounding Event: Surgical Injury N/A N/A Primary Etiology: Open Surgical Wound N/A N/A Comorbid History: Type I Diabetes, History of N/A N/A pressure wounds, Paraplegia Date Acquired: 10/21/2016 N/A N/A Weeks of Treatment: 15 N/A N/A Wound Status: Open N/A N/A Measurements L x W x D 0.7x0.9x0.1 N/A N/A (cm) Area (cm) : 0.495 N/A N/A Volume (cm) : 0.049 N/A N/A % Reduction in Area: 91.60% N/A N/A % Reduction in Volume: 95.80% N/A N/A Classification: Full Thickness Without N/A N/A Exposed Support Structures Exudate Amount: Medium N/A N/A Exudate Type: Serous N/A N/A Exudate Color: amber N/A N/A Wound Margin: Epibole N/A N/A Granulation Amount: Large (67-100%) N/A N/A Granulation Quality: Red N/A N/A Devilla, [name] ([id number]) Necrotic Amount: Small (1-33%) N/A N/A Exposed Structures: Fat Layer (Subcutaneous N/A N/A Tissue) Exposed: Yes Fascia: No Tendon: No Muscle: No Joint: No Bone: No Epithelialization: Small (1-33%) N/A N/A Periwound Skin Texture: Scarring: Yes N/A N/A Excoriation: No Induration: No Callus: No Crepitus: No Rash: No Periwound  Skin Maceration: Yes N/A N/A Moisture: Dry/Scaly: No Periwound Skin Color: Atrophie Blanche: No N/A N/A Cyanosis: No Ecchymosis: No Erythema: No Hemosiderin Staining: No Mottled: No Pallor: No Rubor: No Tenderness on No N/A N/A Palpation: Wound Preparation: Ulcer Cleansing: N/A N/A Rinsed/Irrigated with Saline Topical Anesthetic Applied: Other: lidocaine 4% Treatment Notes Electronic Signature(s) Signed: 06/28/2017 3:13:07 PM By: Christin Fudge MD, FACS Entered By: Christin Fudge on 06/28/2017 15:13:06 [place], [name] ([id number]) -------------------------------------------------------------------------------- [place] Details Patient Name: [name]. Date of Service: 06/28/2017 3:00 PM Medical Record Number: [id number] Patient Account Number: [id number] Date of Birth/Sex: 1971/05/11 (46 y.o. Male) Treating RN: Cornell Barman Primary Care Sadi Arave: Einar Pheasant Other Clinician: Referring Ausha Sieh: Einar Pheasant Treating Belia Febo/Extender: Frann Rider in Treatment: 15 Active Inactive ` Orientation to the Wound Care Program Nursing Diagnoses: Knowledge deficit related to the wound healing center program Goals: Patient/caregiver will verbalize understanding of the [facility] Program Date Initiated: 03/12/2017 Target Resolution Date: 07/12/2017 Goal Status: Active Interventions: Provide education on orientation to the wound center Notes: ` Wound/Skin Impairment Nursing Diagnoses: Impaired tissue integrity Knowledge deficit related to ulceration/compromised skin integrity Goals: Patient/caregiver will verbalize understanding of skin care regimen Date Initiated: 03/12/2017 Target Resolution Date: 07/12/2017 Goal Status: Active Ulcer/skin breakdown will have a volume reduction of 30% by week 4 Date Initiated: 03/12/2017 Target Resolution Date: 07/12/2017 Goal Status: Active Ulcer/skin breakdown will have a volume reduction of 50% by  week 8 Date Initiated: 03/12/2017 Target Resolution Date: 07/12/2017 Goal Status: Active Ulcer/skin breakdown will have a volume reduction of 80% by week 12 Date Initiated: 03/12/2017 Target Resolution Date: 07/12/2017 Goal Status: Active Ulcer/skin breakdown will heal within 14 weeks MECHEL, SCHUTTER ([id number]) Date Initiated: 03/12/2017 Target Resolution Date: 07/12/2017 Goal Status: Active Interventions: Assess patient/caregiver ability to obtain necessary supplies Assess patient/caregiver ability to perform ulcer/skin care regimen upon admission and as needed Assess ulceration(s) every visit Provide education on ulcer and skin care Treatment Activities: Referred to DME Starletta Houchin for dressing supplies : 03/12/2017 Skin care regimen initiated : 03/12/2017 Topical wound management initiated : 03/12/2017 Notes: Electronic Signature(s) Signed: 06/29/2017 12:24:00 PM By: Gretta Cool, BSN, RN, CWS, Kim RN, BSN Entered By: Gretta Cool, BSN, RN, CWS, Kim on 06/28/2017 15:18:36 Reisch, [name] (  [id number]) -------------------------------------------------------------------------------- Pain Assessment Details Patient Name: LAMARR, FEENSTRA. Date of Service: 06/28/2017 3:00 PM Medical Record Number: [id number] Patient Account Number: [id number] Date of Birth/Sex: 10-11-1971 (46 y.o. Male) Treating RN: Cornell Barman Primary Care Gentry Pilson: Einar Pheasant Other Clinician: Referring Tyshun Tuckerman: Einar Pheasant Treating Shiva Sahagian/Extender: Frann Rider in Treatment: 15 Active Problems Location of Pain Severity and Description of Pain Patient Has Paino No Site Locations With Dressing Change: No Pain Management and Medication Current Pain Management: Electronic Signature(s) Signed: 06/29/2017 12:24:00 PM By: Gretta Cool, BSN, RN, CWS, Kim RN, BSN Entered By: Gretta Cool, BSN, RN, CWS, Kim on 06/28/2017 14:56:46 [place], [name]  ([id number]) -------------------------------------------------------------------------------- Patient/Caregiver Education Details Patient Name: [name]. Date of Service: 06/28/2017 3:00 PM Medical Record Number: [id number] Patient Account Number: [id number] Date of Birth/Gender: July 24, 1971 (46 y.o. Male) Treating RN: Cornell Barman Primary Care Physician: Einar Pheasant Other Clinician: Referring Physician: Einar Pheasant Treating Physician/Extender: Frann Rider in Treatment: 15 Education Assessment Education Provided To: Patient Education Topics Provided Wound/Skin Impairment: Handouts: Caring for Your Ulcer, Other: continue wound care as prescribed Methods: Demonstration Responses: State content correctly Electronic Signature(s) Signed: 06/29/2017 12:24:00 PM By: Gretta Cool, BSN, RN, CWS, Kim RN, BSN Entered By: Gretta Cool, BSN, RN, CWS, Kim on 06/28/2017 17:07:13 [place], [name] ([id number]) -------------------------------------------------------------------------------- Wound Assessment Details Patient Name: [name]. Date of Service: 06/28/2017 3:00 PM Medical Record Number: [id number] Patient Account Number: [id number] Date of Birth/Sex: Jun 20, 1971 (46 y.o. Male) Treating RN: Cornell Barman Primary Care Shanan Mcmiller: Einar Pheasant Other Clinician: Referring [place]: Einar Pheasant Treating Gray Maugeri/Extender: Frann Rider in Treatment: 15 Wound Status Wound Number: 4 Primary Open Surgical Wound Etiology: Wound Location: Sacrum - Medial Wound Open Wounding Event: Surgical Injury Status: Date Acquired: 10/21/2016 Comorbid Type I Diabetes, History of pressure Weeks Of Treatment: 15 History: wounds, Paraplegia Clustered Wound: No Photos Wound Measurements Length: (cm) 0.7 Width: (cm) 0.9 Depth: (cm) 0.1 Area: (cm) 0.495 Volume: (cm) 0.049 % Reduction in Area: 91.6% % Reduction in Volume: 95.8% Epithelialization: Small (1-33%) Wound  Description Full Thickness Without Exposed Classification: Support Structures Wound Margin: Epibole Exudate Medium Amount: Exudate Type: Serous Exudate Color: amber Foul Odor After Cleansing: No Slough/Fibrino Yes Wound Bed Granulation Amount: Large (67-100%) Exposed Structure Granulation Quality: Red Fascia Exposed: No Necrotic Amount: Small (1-33%) Fat Layer (Subcutaneous Tissue) Exposed: Yes Necrotic Quality: Adherent Slough Tendon Exposed: No Muscle Exposed: No Joint Exposed: No Yokley, [name] ([id number]) Bone Exposed: No Periwound Skin Texture Texture Color No Abnormalities Noted: No No Abnormalities Noted: No Callus: No Atrophie Blanche: No Crepitus: No Cyanosis: No Excoriation: No Ecchymosis: No Induration: No Erythema: No Rash: No Hemosiderin Staining: No Scarring: Yes Mottled: No Pallor: No Moisture Rubor: No No Abnormalities Noted: No Dry / Scaly: No Maceration: Yes Wound Preparation Ulcer Cleansing: Rinsed/Irrigated with Saline Topical Anesthetic Applied: Other: lidocaine 4%, Treatment Notes Wound #4 (Medial Sacrum) 1. Cleansed with: Clean wound with Normal Saline 2. Anesthetic Topical Lidocaine 4% cream to wound bed prior to debridement 3. Peri-wound Care: Skin Prep 4. Dressing Applied: Other dressing (specify in notes) 5. Secondary Dressing Applied Bordered Foam Dressing Notes Enoform, drawtex, allevyn border Electronic Signature(s) Signed: 06/29/2017 12:24:00 PM By: Gretta Cool, BSN, RN, CWS, Kim RN, BSN Entered By: Gretta Cool, BSN, RN, CWS, Kim on 06/28/2017 15:03:32 [place], [name] ([id number]) -------------------------------------------------------------------------------- Vitals Details Patient Name: [name]. Date of Service: 06/28/2017 3:00 PM Medical Record Number: [id number] Patient Account Number: [id number] Date of Birth/Sex: 05/24/1971 (46 y.o. Male) Treating RN: Cornell Barman Primary Care Jaquail Mclees:  Einar Pheasant Other  Clinician: Referring Chessa Barrasso: Einar Pheasant Treating Margeret Stachnik/Extender: Frann Rider in Treatment: 15 Vital Signs Time Taken: 14:56 Temperature (F): 98.4 Height (in): 72 Pulse (bpm): 61 Weight (lbs): 170 Respiratory Rate (breaths/min): 16 Body Mass Index (BMI): 23.1 Blood Pressure (mmHg): 209/125 Reference Range: 80 - 120 mg / dl Electronic Signature(s) Signed: 06/29/2017 12:24:00 PM By: Gretta Cool, BSN, RN, CWS, Kim RN, BSN Entered By: Gretta Cool, BSN, RN, CWS, Kim on 06/28/2017 14:58:44

## 2017-07-02 ENCOUNTER — Other Ambulatory Visit: Payer: Self-pay

## 2017-07-02 MED ORDER — BACLOFEN 20 MG PO TABS: ORAL_TABLET | ORAL | 2 refills | Status: DC

## 2017-07-02 NOTE — Telephone Encounter (Signed)
ok'd rx for baclofen.

## 2017-07-02 NOTE — Telephone Encounter (Signed)
Pt requested refill on Baclofen 73m 2 tab tid  Last given 05/24/17 #180 no refills See on 05/06/17  F/u 08/06/17

## 2017-07-05 ENCOUNTER — Encounter: Payer: Medicare Other | Admitting: Surgery

## 2017-07-05 DIAGNOSIS — E10622 Type 1 diabetes mellitus with other skin ulcer: Secondary | ICD-10-CM | POA: Diagnosis not present
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
## 2017-07-06 NOTE — Progress Notes (Addendum)
AMADEUS, OYAMA ([id number]) Visit Report for 07/05/2017 Arrival Information Details Patient Name: [name], [name]. Date of Service: 07/05/2017 3:00 PM Medical Record Number: [id number] Patient Account Number: [id number] Date of Birth/Sex: [birth year] (46 y.o. Male) Treating RN: Cornell Barman Primary Care Kearra Calkin: Einar Pheasant Other Clinician: Referring Louetta Hollingshead: Einar Pheasant Treating Arnold Depinto/Extender: Frann Rider in Treatment: 16 Visit Information History Since Last Visit Added or deleted any medications: No Patient Arrived: Wheel Chair Any new allergies or adverse reactions: No Arrival Time: 14:56 Had a fall or experienced change in No activities of daily living that may affect Accompanied By: self risk of falls: Transfer Assistance: Manual Signs or symptoms of abuse/neglect since last No Patient Identification Verified: Yes visito Secondary Verification Process Yes Hospitalized since last visit: No Completed: Pain Present Now: No Patient Requires Transmission-Based No Precautions: Patient Has Alerts: No Electronic Signature(s) Signed: 07/05/2017 5:02:55 PM By: Gretta Cool, BSN, RN, CWS, Kim RN, BSN Entered By: Gretta Cool, BSN, RN, CWS, Kim on 07/05/2017 14:56:41 Plessis, [name] ([id number]) -------------------------------------------------------------------------------- Clinic Level of Care Assessment Details Patient Name: [name]. Date of Service: 07/05/2017 3:00 PM Medical Record Number: [id number] Patient Account Number: [id number] Date of Birth/Sex: 03-13-1971 (46 y.o. Male) Treating RN: Cornell Barman Primary Care Linette Gunderson: Einar Pheasant Other Clinician: Referring Dawayne Ohair: Einar Pheasant Treating Armarion Greek/Extender: Frann Rider in Treatment: 16 Clinic Level of Care Assessment Items TOOL 4 Quantity Score []  - Use when only an EandM is performed on FOLLOW-UP visit 0 ASSESSMENTS - Nursing Assessment / Reassessment []  - Reassessment of Co-morbidities  (includes updates in patient status) 0 X - Reassessment of Adherence to Treatment Plan 1 5 ASSESSMENTS - Wound and Skin Assessment / Reassessment []  - Simple Wound Assessment / Reassessment - one wound 0 []  - Complex Wound Assessment / Reassessment - multiple wounds 0 []  - Dermatologic / Skin Assessment (not related to wound area) 0 ASSESSMENTS - Focused Assessment []  - Circumferential Edema Measurements - multi extremities 0 []  - Nutritional Assessment / Counseling / Intervention 0 []  - Lower Extremity Assessment (monofilament, tuning fork, pulses) 0 []  - Peripheral Arterial Disease Assessment (using hand held doppler) 0 ASSESSMENTS - Ostomy and/or Continence Assessment and Care []  - Incontinence Assessment and Management 0 []  - Ostomy Care Assessment and Management (repouching, etc.) 0 PROCESS - Coordination of Care X - Simple Patient / Family Education for ongoing care 1 15 []  - Complex (extensive) Patient / Family Education for ongoing care 0 []  - Staff obtains Programmer, systems, Records, Test Results / Process Orders 0 []  - Staff telephones HHA, Nursing Homes / Clarify orders / etc 0 []  - Routine Transfer to another Facility (non-emergent condition) 0 Caldera, [name] ([id number]) []  - Routine Hospital Admission (non-emergent condition) 0 []  - New Admissions / Biomedical engineer / Ordering NPWT, Apligraf, etc. 0 []  - Emergency Hospital Admission (emergent condition) 0 []  - Simple Discharge Coordination 0 []  - Complex (extensive) Discharge Coordination 0 PROCESS - Special Needs []  - Pediatric / Minor Patient Management 0 []  - Isolation Patient Management 0 []  - Hearing / Language / Visual special needs 0 []  - Assessment of Community assistance (transportation, D/C planning, etc.) 0 []  - Additional assistance / Altered mentation 0 []  - Support Surface(s) Assessment (bed, cushion, seat, etc.) 0 INTERVENTIONS - Wound Cleansing / Measurement X - Simple Wound Cleansing - one wound 1  5 []  - Complex Wound Cleansing - multiple wounds 0 X - Wound Imaging (photographs - any number of wounds) 1 5 []  - Wound Tracing (  instead of photographs) 0 X - Simple Wound Measurement - one wound 1 5 []  - Complex Wound Measurement - multiple wounds 0 INTERVENTIONS - Wound Dressings []  - Small Wound Dressing one or multiple wounds 0 []  - Medium Wound Dressing one or multiple wounds 0 []  - Large Wound Dressing one or multiple wounds 0 []  - Application of Medications - topical 0 []  - Application of Medications - injection 0 INTERVENTIONS - Miscellaneous []  - External ear exam 0 Wease, [name] ([id number]) []  - Specimen Collection (cultures, biopsies, blood, body fluids, etc.) 0 []  - Specimen(s) / Culture(s) sent or taken to Lab for analysis 0 []  - Patient Transfer (multiple staff / Harrel Lemon Lift / Similar devices) 0 []  - Simple Staple / Suture removal (25 or less) 0 []  - Complex Staple / Suture removal (26 or more) 0 []  - Hypo / Hyperglycemic Management (close monitor of Blood Glucose) 0 []  - Ankle / Brachial Index (ABI) - do not check if billed separately 0 X - Vital Signs 1 5 Has the patient been seen at the hospital within the last three years: Yes Total Score: 40 Level Of Care: New/Established - Level 2 Electronic Signature(s) Signed: 07/05/2017 5:02:55 PM By: Gretta Cool, BSN, RN, CWS, Kim RN, BSN Entered By: Gretta Cool, BSN, RN, CWS, Kim on 07/05/2017 15:09:01 [place], [name] ([id number]) -------------------------------------------------------------------------------- Encounter Discharge Information Details Patient Name: [name]. Date of Service: 07/05/2017 3:00 PM Medical Record Number: [id number] Patient Account Number: [id number] Date of Birth/Sex: [birth year] (46 y.o. Male) Treating RN: Cornell Barman Primary Care Angelicia Lessner: Einar Pheasant Other Clinician: Referring Petrice Beedy: Einar Pheasant Treating Vora Clover/Extender: Frann Rider in Treatment: 16 Encounter Discharge  Information Items Discharge Pain Level: 0 Discharge Condition: Stable Ambulatory Status: Wheelchair Discharge Destination: Home Transportation: Private Auto Accompanied By: self Schedule Follow-up Appointment: Yes Medication Reconciliation completed Yes and provided to Patient/Care Gabryela Kimbrell: Patient Clinical Summary of Care: Declined Electronic Signature(s) Signed: 07/05/2017 3:10:02 PM By: Ruthine Dose Entered By: Ruthine Dose on 07/05/2017 15:10:02 Hagaman, [name] ([id number]) -------------------------------------------------------------------------------- Lower Extremity Assessment Details Patient Name: [name]. Date of Service: 07/05/2017 3:00 PM Medical Record Number: [id number] Patient Account Number: [id number] Date of Birth/Sex: 01-May-1971 (46 y.o. Male) Treating RN: Cornell Barman Primary Care Laketta Soderberg: Einar Pheasant Other Clinician: Referring Kynli Chou: Einar Pheasant Treating Jernee Murtaugh/Extender: Frann Rider in Treatment: 16 Edema Assessment Assessed: Shirlyn Goltz: Yes] [Right: Yes] E[Left: dema] [Right: :] Electronic Signature(s) Signed: 07/05/2017 5:02:55 PM By: Gretta Cool, BSN, RN, CWS, Kim RN, BSN Entered By: Gretta Cool, BSN, RN, CWS, Kim on 07/05/2017 15:04:29 Heeia, [name] ([id number]) -------------------------------------------------------------------------------- Multi Wound Chart Details Patient Name: [name]. Date of Service: 07/05/2017 3:00 PM Medical Record Number: [id number] Patient Account Number: [id number] Date of Birth/Sex: [birth year] (46 y.o. Male) Treating RN: Cornell Barman Primary Care Marcellus Pulliam: Einar Pheasant Other Clinician: Referring Shavona Gunderman: Einar Pheasant Treating Laquonda Welby/Extender: Frann Rider in Treatment: 16 Vital Signs Height(in): 72 Pulse(bpm): 68 Weight(lbs): 170 Blood Pressure 92/58 (mmHg): Body Mass Index(BMI): 23 Temperature(F): Respiratory Rate 16 (breaths/min): Photos: [N/A:N/A] Wound Location: Sacrum  - Medial N/A N/A Wounding Event: Surgical Injury N/A N/A Primary Etiology: Open Surgical Wound N/A N/A Comorbid History: Type I Diabetes, History of N/A N/A pressure wounds, Paraplegia Date Acquired: 10/21/2016 N/A N/A Weeks of Treatment: 16 N/A N/A Wound Status: Healed - Epithelialized N/A N/A Measurements L x W x D 0x0x0 N/A N/A (cm) Area (cm) : 0 N/A N/A Volume (cm) : 0 N/A N/A % Reduction in Area: 100.00% N/A N/A % Reduction in  Volume: 100.00% N/A N/A Classification: Full Thickness Without N/A N/A Exposed Support Structures Exudate Amount: None Present N/A N/A Wound Margin: Epibole N/A N/A Granulation Amount: Large (67-100%) N/A N/A Granulation Quality: Red N/A N/A Necrotic Amount: None Present (0%) N/A N/A Exposed Structures: N/A N/A JAQUAWN, SAFFRAN ([id number]) Fascia: No Fat Layer (Subcutaneous Tissue) Exposed: No Tendon: No Muscle: No Joint: No Bone: No Epithelialization: Large (67-100%) N/A N/A Periwound Skin Texture: Scarring: Yes N/A N/A Excoriation: No Induration: No Callus: No Crepitus: No Rash: No Periwound Skin Maceration: No N/A N/A Moisture: Dry/Scaly: No Periwound Skin Color: Atrophie Blanche: No N/A N/A Cyanosis: No Ecchymosis: No Erythema: No Hemosiderin Staining: No Mottled: No Pallor: No Rubor: No Tenderness on No N/A N/A Palpation: Wound Preparation: Ulcer Cleansing: N/A N/A Rinsed/Irrigated with Saline Topical Anesthetic Applied: None, Other: lidocaine 4% Treatment Notes Electronic Signature(s) Signed: 07/05/2017 3:10:14 PM By: Christin Fudge MD, FACS Entered By: Christin Fudge on 07/05/2017 15:10:14 Wollen, [name] ([id number]) -------------------------------------------------------------------------------- [place] Details Patient Name: [name]. Date of Service: 07/05/2017 3:00 PM Medical Record Number: [id number] Patient Account Number: [id number] Date of Birth/Sex: 04-Aug-1971 (46 y.o.  Male) Treating RN: Cornell Barman Primary Care Paysen Goza: Einar Pheasant Other Clinician: Referring Britanny Marksberry: Einar Pheasant Treating Robert Sunga/Extender: Frann Rider in Treatment: 16 Active Inactive Electronic Signature(s) Signed: 07/07/2017 2:26:58 PM By: Gretta Cool, BSN, RN, CWS, Kim RN, BSN Previous Signature: 07/05/2017 5:02:55 PM Version By: Gretta Cool, BSN, RN, CWS, Kim RN, BSN Entered By: Gretta Cool, BSN, RN, CWS, Kim on 07/07/2017 14:26:57 Gause, [name] ([id number]) -------------------------------------------------------------------------------- Pain Assessment Details Patient Name: RODGERS, LIKES. Date of Service: 07/05/2017 3:00 PM Medical Record Number: [id number] Patient Account Number: [id number] Date of Birth/Sex: 02-Mar-1971 (46 y.o. Male) Treating RN: Cornell Barman Primary Care Lynnsey Barbara: Einar Pheasant Other Clinician: Referring Zyon Rosser: Einar Pheasant Treating Arella Blinder/Extender: Frann Rider in Treatment: 16 Active Problems Location of Pain Severity and Description of Pain Patient Has Paino No Site Locations With Dressing Change: No Pain Management and Medication Current Pain Management: Electronic Signature(s) Signed: 07/05/2017 5:02:55 PM By: Gretta Cool, BSN, RN, CWS, Kim RN, BSN Entered By: Gretta Cool, BSN, RN, CWS, Kim on 07/05/2017 14:56:48 Dedeaux, [name] ([id number]) -------------------------------------------------------------------------------- Wound Assessment Details Patient Name: [name]. Date of Service: 07/05/2017 3:00 PM Medical Record Number: [id number] Patient Account Number: [id number] Date of Birth/Sex: 1971/10/13 (46 y.o. Male) Treating RN: Cornell Barman Primary Care Sonna Lipsky: Einar Pheasant Other Clinician: Referring Sargun Rummell: Einar Pheasant Treating Krupa Stege/Extender: Frann Rider in Treatment: 16 Wound Status Wound Number: 4 Primary Open Surgical Wound Etiology: Wound Location: Sacrum - Medial Wound Healed - Epithelialized Wounding  Event: Surgical Injury Status: Date Acquired: 10/21/2016 Comorbid Type I Diabetes, History of pressure Weeks Of Treatment: 16 History: wounds, Paraplegia Clustered Wound: No Photos Wound Measurements Length: (cm) 0 % Reduction in Width: (cm) 0 % Reduction in Depth: (cm) 0 Epithelializati Area: (cm) 0 Tunneling: Volume: (cm) 0 Area: 100% Volume: 100% on: Large (67-100%) No Wound Description Full Thickness Without Exposed Foul Odor After Classification: Support Structures Slough/Fibrino Wound Margin: Epibole Exudate None Present Amount: Cleansing: No No Wound Bed Granulation Amount: Large (67-100%) Exposed Structure Granulation Quality: Red Fascia Exposed: No Necrotic Amount: None Present (0%) Fat Layer (Subcutaneous Tissue) Exposed: No Tendon Exposed: No Muscle Exposed: No Joint Exposed: No Bone Exposed: No Peitz, [name] ([id number]) Periwound Skin Texture Texture Color No Abnormalities Noted: No No Abnormalities Noted: No Callus: No Atrophie Blanche: No Crepitus: No Cyanosis: No Excoriation: No Ecchymosis: No Induration: No Erythema: No Rash:  No Hemosiderin Staining: No Scarring: Yes Mottled: No Pallor: No Moisture Rubor: No No Abnormalities Noted: No Dry / Scaly: No Maceration: No Wound Preparation Ulcer Cleansing: Rinsed/Irrigated with Saline Topical Anesthetic Applied: None, Other: lidocaine 4%, Electronic Signature(s) Signed: 07/05/2017 5:02:55 PM By: Gretta Cool, BSN, RN, CWS, Kim RN, BSN Entered By: Gretta Cool, BSN, RN, CWS, Kim on 07/05/2017 15:04:17 Snyder, [name] ([id number]) -------------------------------------------------------------------------------- [place] Details Patient Name: [name]. Date of Service: 07/05/2017 3:00 PM Medical Record Number: [id number] Patient Account Number: [id number] Date of Birth/Sex: 1970-12-26 (46 y.o. Male) Treating RN: Cornell Barman Primary Care Alonda Weaber: Einar Pheasant Other Clinician: Referring  Stormi Vandevelde: Einar Pheasant Treating Levette Paulick/Extender: Frann Rider in Treatment: 16 Vital Signs Time Taken: 14:56 Pulse (bpm): 68 Height (in): 72 Respiratory Rate (breaths/min): 16 Weight (lbs): 170 Blood Pressure (mmHg): 92/58 Body Mass Index (BMI): 23.1 Reference Range: 80 - 120 mg / dl Electronic Signature(s) Signed: 07/05/2017 5:02:55 PM By: Gretta Cool, BSN, RN, CWS, Kim RN, BSN Entered By: Gretta Cool, BSN, RN, CWS, Kim on 07/05/2017 14:57:30

## 2017-07-12 ENCOUNTER — Ambulatory Visit: Payer: Medicare Other | Admitting: Physician Assistant

## 2017-08-06 ENCOUNTER — Ambulatory Visit (INDEPENDENT_AMBULATORY_CARE_PROVIDER_SITE_OTHER): Payer: Medicare Other | Admitting: Internal Medicine

## 2017-08-06 ENCOUNTER — Encounter: Payer: Self-pay | Admitting: Internal Medicine

## 2017-08-06 VITALS — BP 128/70 | HR 58 | Temp 98.6°F | Resp 16

## 2017-08-06 DIAGNOSIS — G8220 Paraplegia, unspecified: Secondary | ICD-10-CM | POA: Diagnosis not present

## 2017-08-06 DIAGNOSIS — F339 Major depressive disorder, recurrent, unspecified: Secondary | ICD-10-CM

## 2017-08-06 DIAGNOSIS — L98491 Non-pressure chronic ulcer of skin of other sites limited to breakdown of skin: Secondary | ICD-10-CM

## 2017-08-06 DIAGNOSIS — K649 Unspecified hemorrhoids: Secondary | ICD-10-CM

## 2017-08-06 DIAGNOSIS — E109 Type 1 diabetes mellitus without complications: Secondary | ICD-10-CM

## 2017-08-06 DIAGNOSIS — Z23 Encounter for immunization: Secondary | ICD-10-CM | POA: Diagnosis not present

## 2017-08-06 DIAGNOSIS — F1010 Alcohol abuse, uncomplicated: Secondary | ICD-10-CM

## 2017-08-06 MED ORDER — HYDROCORTISONE ACETATE 25 MG RE SUPP: 25.0000 mg | Freq: Two times a day (BID) | RECTAL | 0 refills | Status: DC
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
## 2017-08-09 ENCOUNTER — Encounter: Payer: Self-pay | Admitting: Internal Medicine

## 2017-08-09 DIAGNOSIS — F339 Major depressive disorder, recurrent, unspecified: Secondary | ICD-10-CM | POA: Insufficient documentation

## 2017-08-09 NOTE — Assessment & Plan Note (Signed)
Stable

## 2017-08-09 NOTE — Assessment & Plan Note (Signed)
Discussed with him today.  Some low sugars as outlined.  Discussed importance of eating before bed and not going long periods without eating.  Follow met b and a1c.

## 2017-08-09 NOTE — Assessment & Plan Note (Signed)
Was followed by wound clinic.  Resolved.

## 2017-08-09 NOTE — Assessment & Plan Note (Signed)
Discussed with him today.  Discussed further treatment and evaluation.  He declines.  No suicidal thoughts.  Follow.

## 2017-08-09 NOTE — Assessment & Plan Note (Signed)
Discussed with him today regarding the need to decrease his alcohol intake.  Follow.

## 2017-10-06 ENCOUNTER — Other Ambulatory Visit: Payer: Self-pay | Admitting: Internal Medicine

## 2017-10-06 NOTE — Telephone Encounter (Signed)
Last OV was 08/06/17, Last refill was 07/02/17, #180 with 2 refills, please advise thanks

## 2017-10-07 NOTE — Telephone Encounter (Signed)
rx ok'd for #180 with 2 refills.

## 2017-11-08 ENCOUNTER — Encounter: Payer: Medicare Other | Attending: Physician Assistant | Admitting: Physician Assistant

## 2017-11-08 DIAGNOSIS — G8254 Quadriplegia, C5-C7 incomplete: Secondary | ICD-10-CM | POA: Insufficient documentation

## 2017-11-08 DIAGNOSIS — L89613 Pressure ulcer of right heel, stage 3: Secondary | ICD-10-CM | POA: Diagnosis not present

## 2017-11-08 DIAGNOSIS — E11621 Type 2 diabetes mellitus with foot ulcer: Secondary | ICD-10-CM | POA: Insufficient documentation

## 2017-11-08 DIAGNOSIS — G8929 Other chronic pain: Secondary | ICD-10-CM | POA: Insufficient documentation

## 2017-11-08 DIAGNOSIS — L97909 Non-pressure chronic ulcer of unspecified part of unspecified lower leg with unspecified severity: Secondary | ICD-10-CM | POA: Diagnosis not present

## 2017-11-08 DIAGNOSIS — F329 Major depressive disorder, single episode, unspecified: Secondary | ICD-10-CM | POA: Diagnosis not present

## 2017-11-08 DIAGNOSIS — I10 Essential (primary) hypertension: Secondary | ICD-10-CM | POA: Diagnosis not present

## 2017-11-08 DIAGNOSIS — Z882 Allergy status to sulfonamides status: Secondary | ICD-10-CM | POA: Insufficient documentation

## 2017-11-08 DIAGNOSIS — L97422 Non-pressure chronic ulcer of left heel and midfoot with fat layer exposed: Secondary | ICD-10-CM | POA: Diagnosis not present

## 2017-11-08 DIAGNOSIS — Z794 Long term (current) use of insulin: Secondary | ICD-10-CM | POA: Diagnosis not present

## 2017-11-08 NOTE — Progress Notes (Signed)
DEBRA, CALABRETTA ([id number]) Visit Report for 11/08/2017 Abuse/Suicide Risk Screen Details Patient Name: [name], [name]. Date of Service: 11/08/2017 8:00 AM Medical Record Number: [id number] Patient Account Number: [id number] Date of Birth/Sex: 10/06/1971 (46 y.o. Male) Treating RN: Montey Hora Primary Care Krissia Schreier: Einar Pheasant Other Clinician: Referring Rilee Wendling: Einar Pheasant Treating Kingstyn Deruiter/Extender: Melburn Hake, HOYT Weeks in Treatment: 0 Abuse/Suicide Risk Screen Items Answer ABUSE/SUICIDE RISK SCREEN: Has anyone close to you tried to hurt or harm you recentlyo No Do you feel uncomfortable with anyone in your familyo No Has anyone forced you do things that you didnot want to doo No Do you have any thoughts of harming yourselfo No Patient displays signs or symptoms of abuse and/or neglect. No Electronic Signature(s) Signed: 11/08/2017 4:00:26 PM By: Montey Hora Entered By: Montey Hora on 11/08/2017 08:17:58 Elster, [name] ([id number]) -------------------------------------------------------------------------------- Activities of Daily Living Details Patient Name: [name]. Date of Service: 11/08/2017 8:00 AM Medical Record Number: [id number] Patient Account Number: [id number] Date of Birth/Sex: 1971-07-09 (46 y.o. Male) Treating RN: Montey Hora Primary Care Cara Aguino: Einar Pheasant Other Clinician: Referring Kenson Groh: Einar Pheasant Treating Lateesha Bezold/Extender: Melburn Hake, HOYT Weeks in Treatment: 0 Activities of Daily Living Items Answer Activities of Daily Living (Please select one for each item) Drive Automobile Completely Able Take Medications Completely Able Use Telephone Completely Able Care for Appearance Completely Able Use Toilet Completely Able Bath / Shower Completely Able Dress Self Completely Able Feed Self Completely Able Walk Completely Able Get In / Out Bed Completely Able Housework Completely Able Prepare Meals Completely  [place] for Self Completely Able Electronic Signature(s) Signed: 11/08/2017 4:00:26 PM By: Montey Hora Entered By: Montey Hora on 11/08/2017 08:18:19 Jankowski, [name] ([id number]) -------------------------------------------------------------------------------- Education Assessment Details Patient Name: [name]. Date of Service: 11/08/2017 8:00 AM Medical Record Number: [id number] Patient Account Number: [id number] Date of Birth/Sex: [birth year] (46 y.o. Male) Treating RN: Montey Hora Primary Care Eleasha Cataldo: Einar Pheasant Other Clinician: Referring Tuvia Woodrick: Einar Pheasant Treating Jemima Petko/Extender: Melburn Hake, HOYT Weeks in Treatment: 0 Primary Learner Assessed: Patient Learning Preferences/Education Level/Primary Language Learning Preference: Explanation, Demonstration Highest Education Level: College or Above Preferred Language: English Cognitive Barrier Assessment/Beliefs Language Barrier: No Translator Needed: No Memory Deficit: No Emotional Barrier: No Cultural/Religious Beliefs Affecting Medical Care: No Physical Barrier Assessment Impaired Vision: No Impaired Hearing: No Decreased Hand dexterity: No Knowledge/Comprehension Assessment Knowledge Level: Medium Comprehension Level: Medium Ability to understand written Medium instructions: Ability to understand verbal Medium instructions: Motivation Assessment Anxiety Level: Calm Cooperation: Cooperative Education Importance: Acknowledges Need Interest in Health Problems: Asks Questions Perception: Coherent Willingness to Engage in Self- Medium Management Activities: Readiness to Engage in Self- Medium Management Activities: Electronic Signature(s) Signed: 11/08/2017 4:00:26 PM By: Montey Hora Entered By: Montey Hora on 11/08/2017 08:18:50 Bohorquez, [name] ([id number]) -------------------------------------------------------------------------------- Fall Risk  Assessment Details Patient Name: [name]. Date of Service: 11/08/2017 8:00 AM Medical Record Number: [id number] Patient Account Number: [id number] Date of Birth/Sex: 06-27-1971 (46 y.o. Male) Treating RN: Montey Hora Primary Care Kelsye Loomer: Einar Pheasant Other Clinician: Referring Iria Jamerson: Einar Pheasant Treating Mihailo Sage/Extender: Melburn Hake, HOYT Weeks in Treatment: 0 Fall Risk Assessment Items Have you had 2 or more falls in the last 12 monthso 0 No Have you had any fall that resulted in injury in the last 12 monthso 0 No FALL RISK ASSESSMENT: History of falling - immediate or within 3 months 0 No Secondary diagnosis 0 No Ambulatory aid None/bed rest/wheelchair/nurse 0 Yes Crutches/cane/walker 0 No Furniture  0 No IV Access/Saline Lock 0 No Gait/Training Normal/bed rest/immobile 0 Yes Weak 0 No Impaired 0 No Mental Status Oriented to own ability 0 Yes Electronic Signature(s) Signed: 11/08/2017 4:00:26 PM By: Montey Hora Entered By: Montey Hora on 11/08/2017 08:19:07 Finlayson, [name] ([id number]) -------------------------------------------------------------------------------- Foot Assessment Details Patient Name: [name]. Date of Service: 11/08/2017 8:00 AM Medical Record Number: [id number] Patient Account Number: [id number] Date of Birth/Sex: 1971/09/05 (46 y.o. Male) Treating RN: Montey Hora Primary Care Brin Ruggerio: Einar Pheasant Other Clinician: Referring Ardith Lewman: Einar Pheasant Treating Britney Captain/Extender: Melburn Hake, HOYT Weeks in Treatment: 0 Foot Assessment Items Site Locations + = Sensation present, - = Sensation absent, C = Callus, U = Ulcer R = Redness, W = Warmth, M = Maceration, PU = Pre-ulcerative lesion F = Fissure, S = Swelling, D = Dryness Assessment Right: Left: Other Deformity: No No Prior Foot Ulcer: No No Prior Amputation: No No Charcot Joint: No No Ambulatory Status: Non-ambulatory Assistance Device: Wheelchair Gait:  Steady Electronic Signature(s) Signed: 11/08/2017 4:00:26 PM By: Montey Hora Entered By: Montey Hora on 11/08/2017 08:20:06 Braud, [name] ([id number]) -------------------------------------------------------------------------------- Nutrition Risk Assessment Details Patient Name: [name]. Date of Service: 11/08/2017 8:00 AM Medical Record Number: [id number] Patient Account Number: [id number] Date of Birth/Sex: [birth year] (46 y.o. Male) Treating RN: Montey Hora Primary Care Darius Fillingim: Einar Pheasant Other Clinician: Referring Malaka Ruffner: Einar Pheasant Treating Joson Sapp/Extender: Melburn Hake, HOYT Weeks in Treatment: 0 Height (in): Weight (lbs): Body Mass Index (BMI): Nutrition Risk Assessment Items NUTRITION RISK SCREEN: I have an illness or condition that made me change the kind and/or amount of 0 No food I eat I eat fewer than two meals per day 0 No I eat few fruits and vegetables, or milk products 0 No I have three or more drinks of beer, liquor or wine almost every day 0 No I have tooth or mouth problems that make it hard for me to eat 0 No I don't always have enough money to buy the food I need 0 No I eat alone most of the time 0 No I take three or more different prescribed or over-the-counter drugs a day 1 Yes Without wanting to, I have lost or gained 10 pounds in the last six months 0 No I am not always physically able to shop, cook and/or feed myself 0 No Nutrition Protocols Good Risk Protocol 0 No interventions needed Moderate Risk Protocol Electronic Signature(s) Signed: 11/08/2017 4:00:26 PM By: Montey Hora Entered By: Montey Hora on 11/08/2017 08:19:14

## 2017-11-09 NOTE — Progress Notes (Signed)
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 paraplegia Musculoskeletal Medical History: Negative for: Gout; Rheumatoid Arthritis; Osteoarthritis; Osteomyelitis Neurologic Medical History: Positive for: Paraplegia - chest down Negative for: Dementia; Neuropathy; Quadriplegia; Seizure Disorder Oncologic Medical History: Negative for: Received Chemotherapy; Received Radiation Psychiatric Medical History: Negative for: Anorexia/bulimia;  Confinement Anxiety Immunizations Pneumococcal Vaccine: Received Pneumococcal Vaccination: No Implantable Devices STEWART, SASAKI ([id number]) Family and Social History Cancer: Yes - Father; Diabetes: No; Heart Disease: No; Hypertension: Yes - Siblings,Paternal Grandparents,Father; Kidney Disease: No; Lung Disease: No; Seizures: No; Stroke: No; Thyroid Problems: No; Tuberculosis: No; Never smoker; Marital Status - Single; Alcohol Use: Daily - beer; Drug Use: No History; Caffeine Use: Moderate; Financial Concerns: No; Food, Clothing or Shelter Needs: No; Support  System Lacking: No; Transportation Concerns: No; Advanced Directives: No; Patient does not want information on Advanced Directives; Living Will: Yes (Not Provided); Medical Power of Attorney: Yes (Not Provided) Electronic Signature(s) Signed: 11/08/2017 4:00:26 PM By: Montey Hora Signed: 11/08/2017 5:27:57 PM By: Worthy Keeler PA-C Entered By: Montey Hora on 11/08/2017 08:17:47 Clayton, [name] ([id number]) -------------------------------------------------------------------------------- SuperBill Details Patient Name: [name] Girt. Date of Service: 11/08/2017 Medical Record Number: [id number] Patient Account Number: [id number] Date of Birth/Sex: 08-Dec-1970 (46 y.o. 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

## 2017-11-09 NOTE — Progress Notes (Signed)
[name], [name] ([id number]) Visit Report for 11/08/2017 Allergy List Details Patient Name: [name], [name]. Date of Service: 11/08/2017 8:00 AM Medical Record Number: [id number] Patient Account Number: [id number] Date of Birth/Sex: 03/12/1971 (46 y.o. Male) Treating RN: Montey Hora Primary Care Inesha Sow: Einar Pheasant Other Clinician: Referring Trever Streater: Einar Pheasant Treating Ariel Dimitri/Extender: Melburn Hake, HOYT Weeks in Treatment: 0 Allergies Active Allergies Sulfa (Sulfonamide Antibiotics) Reaction: tongue swells Iodinated Contrast Media - IV Dye decongestant Allergy Notes Electronic Signature(s) Signed: 11/08/2017 4:00:26 PM By: Montey Hora Entered By: Montey Hora on 11/08/2017 08:15:23 Mester, [name] ([id number]) -------------------------------------------------------------------------------- Arrival Information Details Patient Name: [name]. Date of Service: 11/08/2017 8:00 AM Medical Record Number: [id number] Patient Account Number: [id number] Date of Birth/Sex: 1971-04-30 (46 y.o. Male) Treating RN: Montey Hora Primary Care Joniah Bednarski: Einar Pheasant Other Clinician: Referring Qusay Villada: Einar Pheasant Treating Tyri Elmore/Extender: Melburn Hake, HOYT Weeks in Treatment: 0 Visit Information Patient Arrived: Wheel Chair Arrival Time: 08:07 Accompanied By: self Transfer Assistance: None Patient Identification Verified: Yes Secondary Verification Process Completed: Yes Patient Has Alerts: Yes Patient Alerts: DMI History Since Last Visit Added or deleted any medications: No Any new allergies or adverse reactions: No Had a fall or experienced change in activities of daily living that may affect risk of falls: No Signs or symptoms of abuse/neglect since last visito No Hospitalized since last visit: No Has Dressing in Place as Prescribed: Yes Electronic Signature(s) Signed: 11/08/2017 9:01:58 AM By: Montey Hora Entered By: Montey Hora on 11/08/2017  09:01:58 Terry, [name] ([id number]) -------------------------------------------------------------------------------- Clinic Level of Care Assessment Details Patient Name: [name]. Date of Service: 11/08/2017 8:00 AM Medical Record Number: [id number] Patient Account Number: [id number] Date of Birth/Sex: Sep 21, 1971 (46 y.o. Male) Treating RN: Montey Hora Primary Care Fritzie Prioleau: Einar Pheasant Other Clinician: Referring Doryce Mcgregory: Einar Pheasant Treating Rontavious Albright/Extender: Melburn Hake, HOYT Weeks in Treatment: 0 Clinic Level of Care Assessment Items TOOL 1 Quantity Score []  - Use when EandM and Procedure is performed on INITIAL visit 0 ASSESSMENTS - Nursing Assessment / Reassessment X - General Physical Exam (combine w/ comprehensive assessment (listed just below) when 1 20 performed on new pt. evals) X- 1 25 Comprehensive Assessment (HX, ROS, Risk Assessments, Wounds Hx, etc.) ASSESSMENTS - Wound and Skin Assessment / Reassessment []  - Dermatologic / Skin Assessment (not related to wound area) 0 ASSESSMENTS - Ostomy and/or Continence Assessment and Care []  - Incontinence Assessment and Management 0 []  - 0 Ostomy Care Assessment and Management (repouching, etc.) PROCESS - Coordination of Care X - Simple Patient / Family Education for ongoing care 1 15 []  - 0 Complex (extensive) Patient / Family Education for ongoing care X- 1 10 Staff obtains Programmer, systems, Records, Test Results / Process Orders []  - 0 Staff telephones HHA, Nursing Homes / Clarify orders / etc []  - 0 Routine Transfer to another Facility (non-emergent condition) []  - 0 Routine Hospital Admission (non-emergent condition) X- 1 15 New Admissions / Biomedical engineer / Ordering NPWT, Apligraf, etc. []  - 0 Emergency Hospital Admission (emergent condition) PROCESS - Special Needs []  - Pediatric / Minor Patient Management 0 []  - 0 Isolation Patient Management []  - 0 Hearing / Language / Visual special  needs []  - 0 Assessment of Community assistance (transportation, D/C planning, etc.) []  - 0 Additional assistance / Altered mentation []  - 0 Support Surface(s) Assessment (bed, cushion, seat, etc.) Roskelley, [name] ([id number]) INTERVENTIONS - Miscellaneous []  - External ear exam 0 []  - 0 Patient Transfer (multiple staff / Civil Service fast streamer / Similar  devices) []  - 0 Simple Staple / Suture removal (25 or less) []  - 0 Complex Staple / Suture removal (26 or more) []  - 0 Hypo/Hyperglycemic Management (do not check if billed separately) X- 1 15 Ankle / Brachial Index (ABI) - do not check if billed separately Has the patient been seen at the hospital within the last three years: Yes Total Score: 100 Level Of Care: New/Established - Level 3 Electronic Signature(s) Signed: 11/08/2017 4:00:26 PM By: Montey Hora Entered By: Montey Hora on 11/08/2017 09:55:22 Schwabe, [name] ([id number]) -------------------------------------------------------------------------------- Encounter Discharge Information Details Patient Name: [name]. Date of Service: 11/08/2017 8:00 AM Medical Record Number: [id number] Patient Account Number: [id number] Date of Birth/Sex: [birth year] (46 y.o. Male) Treating RN: Montey Hora Primary Care Ceri Mayer: Einar Pheasant Other Clinician: Referring Tahani Potier: Einar Pheasant Treating Delcia Spitzley/Extender: Melburn Hake, HOYT Weeks in Treatment: 0 Encounter Discharge Information Items Discharge Pain Level: 0 Discharge Condition: Stable Ambulatory Status: Wheelchair Discharge Destination: Home Transportation: Private Auto Accompanied By: self Schedule Follow-up Appointment: Yes Medication Reconciliation completed and No provided to Patient/Care Hosteen Kienast: Patient Clinical Summary of Care: Declined Electronic Signature(s) Signed: 11/08/2017 9:57:37 AM By: Montey Hora Entered By: Montey Hora on 11/08/2017 09:57:37 Navarrette, [name]  ([id number]) -------------------------------------------------------------------------------- Lower Extremity Assessment Details Patient Name: [name]. Date of Service: 11/08/2017 8:00 AM Medical Record Number: [id number] Patient Account Number: [id number] Date of Birth/Sex: 1971-11-15 (46 y.o. Male) Treating RN: Montey Hora Primary Care Jolissa Kapral: Einar Pheasant Other Clinician: Referring Lucita Montoya: Einar Pheasant Treating Arielle Eber/Extender: Melburn Hake, HOYT Weeks in Treatment: 0 Vascular Assessment Pulses: Dorsalis Pedis Palpable: [Left:Yes] Doppler Audible: [Left:Yes] Posterior Tibial Palpable: [Left:Yes] Doppler Audible: [Left:Yes] Extremity colors, hair growth, and conditions: Extremity Color: [Left:Normal] Hair Growth on Extremity: [Left:No] Temperature of Extremity: [Left:Cool] Capillary Refill: [Left:> 3 seconds] Blood Pressure: Brachial: [Left:118] Dorsalis Pedis: 122 [Left:Dorsalis Pedis:] Ankle: Posterior Tibial: 132 [Left:Posterior Tibial: 1.12] Toe Nail Assessment Left: Right: Thick: No Discolored: No Deformed: No Improper Length and Hygiene: No Electronic Signature(s) Signed: 11/08/2017 4:00:26 PM By: Montey Hora Entered By: Montey Hora on 11/08/2017 08:38:55 Yoss, [name] ([id number]) -------------------------------------------------------------------------------- Multi Wound Chart Details Patient Name: [name]. Date of Service: 11/08/2017 8:00 AM Medical Record Number: [id number] Patient Account Number: [id number] Date of Birth/Sex: October 29, 1971 (46 y.o. Male) Treating RN: Montey Hora Primary Care Ewen Varnell: Einar Pheasant Other Clinician: Referring Leshawn Straka: Einar Pheasant Treating Sama Arauz/Extender: Melburn Hake, HOYT Weeks in Treatment: 0 Vital Signs Height(in): Pulse(bpm): 18 Weight(lbs): Blood Pressure(mmHg): 122/70 Body Mass Index(BMI): Temperature(F): 98.4 Respiratory Rate 16 (breaths/min): Photos: [6:No Photos]  [N/A:N/A] Wound Location: [6:Left Calcaneus] [N/A:N/A] Wounding Event: [6:Pressure Injury] [N/A:N/A] Primary Etiology: [6:Diabetic Wound/Ulcer of the Lower Extremity] [N/A:N/A] Secondary Etiology: [6:Pressure Ulcer] [N/A:N/A] Comorbid History: [6:Type I Diabetes, History of pressure wounds, Paraplegia] [N/A:N/A] Date Acquired: [6:10/25/2017] [N/A:N/A] Weeks of Treatment: [6:0] [N/A:N/A] Wound Status: [6:Open] [N/A:N/A] Pending Amputation on [6:Yes] [N/A:N/A] Presentation: Measurements L x W x D [6:0.6x2x0.2] [N/A:N/A] (cm) Area (cm) : [6:0.942] [N/A:N/A] Volume (cm) : [6:0.188] [N/A:N/A] Classification: [6:Grade 2] [N/A:N/A] Exudate Amount: [6:Large] [N/A:N/A] Exudate Type: [6:Serous] [N/A:N/A] Exudate Color: [6:amber] [N/A:N/A] Wound Margin: [6:Flat and Intact] [N/A:N/A] Granulation Amount: [6:Medium (34-66%)] [N/A:N/A] Granulation Quality: [6:Red] [N/A:N/A] Necrotic Amount: [6:Medium (34-66%)] [N/A:N/A] Necrotic Tissue: [6:Eschar, Adherent Slough] [N/A:N/A] Exposed Structures: [6:Fascia: No Fat Layer (Subcutaneous Tissue) Exposed: No Tendon: No Muscle: No Joint: No Bone: No] [N/A:N/A] Epithelialization: [6:None] [N/A:N/A] Periwound Skin Texture: [6:Excoriation: No Induration: No Callus: No] [N/A:N/A] Crepitus: No Rash: No Scarring: No Periwound Skin Moisture: Maceration: Yes N/A N/A Dry/Scaly: No Periwound Skin  Color: Atrophie Blanche: No N/A N/A Cyanosis: No Ecchymosis: No Erythema: No Hemosiderin Staining: No Mottled: No Pallor: No Rubor: No Temperature: No Abnormality N/A N/A Tenderness on Palpation: Yes N/A N/A Wound Preparation: Ulcer Cleansing: N/A N/A Rinsed/Irrigated with Saline Topical Anesthetic Applied: Other: lidocaine 4% Treatment Notes Electronic Signature(s) Signed: 11/08/2017 4:00:26 PM By: Montey Hora Entered By: Montey Hora on 11/08/2017 09:03:57 Paull, [name]  ([id number]) -------------------------------------------------------------------------------- [place] Details Patient Name: [name]. Date of Service: 11/08/2017 8:00 AM Medical Record Number: [id number] Patient Account Number: [id number] Date of Birth/Sex: 29-Nov-1971 (46 y.o. Male) Treating RN: Montey Hora Primary Care Teia Freitas: Einar Pheasant Other Clinician: Referring Marysa Wessner: Einar Pheasant Treating Prabhjot Maddux/Extender: Melburn Hake, HOYT Weeks in Treatment: 0 Active Inactive ` Orientation to the Wound Care Program Nursing Diagnoses: Knowledge deficit related to the wound healing center program Goals: Patient/caregiver will verbalize understanding of the [facility] Program Date Initiated: 11/08/2017 Target Resolution Date: 02/12/2018 Goal Status: Active Interventions: Provide education on orientation to the wound center Notes: ` Pressure Nursing Diagnoses: Potential for impaired tissue integrity related to pressure, friction, moisture, and shear Goals: Patient will remain free from development of additional pressure ulcers Date Initiated: 11/08/2017 Target Resolution Date: 02/12/2018 Goal Status: Active Interventions: Provide education on pressure ulcers Notes: ` Wound/Skin Impairment Nursing Diagnoses: Impaired tissue integrity Goals: Ulcer/skin breakdown will heal within 14 weeks Date Initiated: 11/08/2017 Target Resolution Date: 02/12/2018 Goal Status: Active Interventions: [name], [name] ([id number]) Assess patient/caregiver ability to obtain necessary supplies Assess patient/caregiver ability to perform ulcer/skin care regimen upon admission and as needed Assess ulceration(s) every visit Notes: Electronic Signature(s) Signed: 11/08/2017 4:00:26 PM By: Montey Hora Entered By: Montey Hora on 11/08/2017 09:03:43 Dauenhauer, [name]  ([id number]) -------------------------------------------------------------------------------- Pain Assessment Details Patient Name: [name]. Date of Service: 11/08/2017 8:00 AM Medical Record Number: [id number] Patient Account Number: [id number] Date of Birth/Sex: 1971-02-22 (47 y.o. Male) Treating RN: Montey Hora Primary Care Jaydis Duchene: Einar Pheasant Other Clinician: Referring Kura Bethards: Einar Pheasant Treating Jaxyn Rout/Extender: Melburn Hake, HOYT Weeks in Treatment: 0 Active Problems Location of Pain Severity and Description of Pain Patient Has Paino No Site Locations Pain Management and Medication Current Pain Management: Notes Topical or injectable lidocaine is offered to patient for acute pain when surgical debridement is performed. If needed, Patient is instructed to use over the counter pain medication for the following 24-48 hours after debridement. Wound care MDs do not prescribed pain medications. Patient has chronic pain or uncontrolled pain. Patient has been instructed to make an appointment with their Primary Care Physician for pain management. Electronic Signature(s) Signed: 11/08/2017 4:00:26 PM By: Montey Hora Entered By: Montey Hora on 11/08/2017 08:14:30 Silvio, [name] ([id number]) -------------------------------------------------------------------------------- Patient/Caregiver Education Details Patient Name: [name]. Date of Service: 11/08/2017 8:00 AM Medical Record Number: [id number] Patient Account Number: [id number] Date of Birth/Gender: [birth year] (46 y.o. Male) Treating RN: Montey Hora Primary Care Physician: Einar Pheasant Other Clinician: Referring Physician: Einar Pheasant Treating Physician/Extender: Sharalyn Ink in Treatment: 0 Education Assessment Education Provided To: Patient Education Topics Provided Wound/Skin Impairment: Handouts: Other: wound care as ordered Methods: Demonstration,  Explain/Verbal Responses: State content correctly Electronic Signature(s) Signed: 11/08/2017 4:00:26 PM By: Montey Hora Entered By: Montey Hora on 11/08/2017 09:57:53 Strzelecki, [name] ([id number]) -------------------------------------------------------------------------------- Wound Assessment Details Patient Name: [name]. Date of Service: 11/08/2017 8:00 AM Medical Record Number: [id number] Patient Account Number: [id number] Date of Birth/Sex: 1971-08-28 (46 y.o. Male) Treating RN: Montey Hora Primary Care Barnaby Rippeon: Einar Pheasant Other  Clinician: Referring Oree Mirelez: Einar Pheasant Treating Willia Lampert/Extender: STONE III, HOYT Weeks in Treatment: 0 Wound Status Wound Number: 6 Primary Etiology: Diabetic Wound/Ulcer of the Lower Extremity Wound Location: Left Calcaneus Secondary Pressure Ulcer Wounding Event: Pressure Injury Etiology: Date Acquired: 10/25/2017 Wound Status: Open Weeks Of Treatment: 0 Comorbid Type I Diabetes, History of pressure Clustered Wound: No History: wounds, Paraplegia Pending Amputation On Presentation Photos Photo Uploaded By: Montey Hora on 11/08/2017 12:23:39 Wound Measurements Length: (cm) 0.6 Width: (cm) 2 Depth: (cm) 0.2 Area: (cm) 0.942 Volume: (cm) 0.188 % Reduction in Area: 0% % Reduction in Volume: 0% Epithelialization: None Tunneling: No Undermining: No Wound Description Classification: Grade 1 Wound Margin: Flat and Intact Exudate Amount: Large Exudate Type: Serous Exudate Color: amber Foul Odor After Cleansing: No Slough/Fibrino Yes Wound Bed Granulation Amount: Medium (34-66%) Exposed Structure Granulation Quality: Red Fascia Exposed: No Necrotic Amount: Medium (34-66%) Fat Layer (Subcutaneous Tissue) Exposed: No Necrotic Quality: Eschar, Adherent Slough Tendon Exposed: No Muscle Exposed: No Joint Exposed: No Bone Exposed: No Periwound Skin Texture Cozby, [name] ([id number]) Texture  Color No Abnormalities Noted: No No Abnormalities Noted: No Callus: No Atrophie Blanche: No Crepitus: No Cyanosis: No Excoriation: No Ecchymosis: No Induration: No Erythema: No Rash: No Hemosiderin Staining: No Scarring: No Mottled: No Pallor: No Moisture Rubor: No No Abnormalities Noted: No Dry / Scaly: No Temperature / Pain Maceration: Yes Temperature: No Abnormality Tenderness on Palpation: Yes Wound Preparation Ulcer Cleansing: Rinsed/Irrigated with Saline Topical Anesthetic Applied: Other: lidocaine 4%, Assessment Notes Stage 3 pressure ulcer Treatment Notes Wound #6 (Left Calcaneus) 1. Cleansed with: Clean wound with Normal Saline 2. Anesthetic Topical Lidocaine 4% cream to wound bed prior to debridement 4. Dressing Applied: Other dressing (specify in notes) 5. Secondary Dressing Applied Bordered Foam Dressing Notes silvercel Electronic Signature(s) Signed: 11/08/2017 4:00:26 PM By: Montey Hora Signed: 11/08/2017 5:27:57 PM By: Worthy Keeler PA-C Entered By: Worthy Keeler on 11/08/2017 09:16:27 Posch, [name] ([id number]) -------------------------------------------------------------------------------- Vitals Details Patient Name: [name]. Date of Service: 11/08/2017 8:00 AM Medical Record Number: [id number] Patient Account Number: [id number] Date of Birth/Sex: [birth year] (46 y.o. Male) Treating RN: Montey Hora Primary Care Lamonte Hartt: Einar Pheasant Other Clinician: Referring Jazira Maloney: Einar Pheasant Treating Sarika Baldini/Extender: Melburn Hake, HOYT Weeks in Treatment: 0 Vital Signs Time Taken: 08:14 Temperature (F): 98.4 Pulse (bpm): 71 Respiratory Rate (breaths/min): 16 Blood Pressure (mmHg): 122/70 Reference Range: 80 - 120 mg / dl Electronic Signature(s) Signed: 11/08/2017 4:00:26 PM By: Montey Hora Entered By: Montey Hora on 11/08/2017 08:15:04

## 2017-11-22 ENCOUNTER — Ambulatory Visit: Payer: Medicare Other | Admitting: Surgery

## 2017-11-22 ENCOUNTER — Other Ambulatory Visit: Payer: Self-pay | Admitting: Internal Medicine

## 2017-12-02 ENCOUNTER — Encounter: Payer: Medicare Other | Admitting: Surgery

## 2017-12-02 DIAGNOSIS — Z882 Allergy status to sulfonamides status: Secondary | ICD-10-CM | POA: Diagnosis not present

## 2017-12-02 DIAGNOSIS — G8254 Quadriplegia, C5-C7 incomplete: Secondary | ICD-10-CM | POA: Diagnosis not present

## 2017-12-02 DIAGNOSIS — L97422 Non-pressure chronic ulcer of left heel and midfoot with fat layer exposed: Secondary | ICD-10-CM | POA: Diagnosis not present

## 2017-12-02 DIAGNOSIS — Z794 Long term (current) use of insulin: Secondary | ICD-10-CM | POA: Diagnosis not present

## 2017-12-02 DIAGNOSIS — G8929 Other chronic pain: Secondary | ICD-10-CM | POA: Diagnosis not present

## 2017-12-02 DIAGNOSIS — L89613 Pressure ulcer of right heel, stage 3: Secondary | ICD-10-CM | POA: Diagnosis not present

## 2017-12-02 DIAGNOSIS — I10 Essential (primary) hypertension: Secondary | ICD-10-CM | POA: Diagnosis not present

## 2017-12-02 DIAGNOSIS — E11621 Type 2 diabetes mellitus with foot ulcer: Secondary | ICD-10-CM | POA: Diagnosis not present

## 2017-12-02 DIAGNOSIS — E119 Type 2 diabetes mellitus without complications: Secondary | ICD-10-CM | POA: Diagnosis not present

## 2017-12-04 NOTE — Progress Notes (Signed)
[name] ([id number]) Visit Report for 12/02/2017 Arrival Information Details Patient Name: [name], [name]. Date of Service: 12/02/2017 3:30 PM Medical Record Number: [id number] Patient Account Number: [id number] Date of Birth/Sex: Dec 16, 1970 (46 y.o. Male) Treating RN: Roger Shelter Primary Care Milon Dethloff: Einar Pheasant Other Clinician: Referring Sheretta Grumbine: Einar Pheasant Treating Daeveon Zweber/Extender: Frann Rider in Treatment: 3 Visit Information History Since Last Visit All ordered tests and consults were completed: No Patient Arrived: Wheel Chair Added or deleted any medications: No Arrival Time: 15:32 Any new allergies or adverse reactions: No Accompanied By: self Had a fall or experienced change in No Transfer Assistance: None activities of daily living that may affect Patient Has Alerts: Yes risk of falls: Patient Alerts: DMI Signs or symptoms of abuse/neglect since last visito No Hospitalized since last visit: No Pain Present Now: No Electronic Signature(s) Signed: 12/03/2017 2:36:38 PM By: Roger Shelter Entered By: Roger Shelter on 12/02/2017 15:33:31 [place], [name] ([id number]) -------------------------------------------------------------------------------- Clinic Level of Care Assessment Details Patient Name: [name]. Date of Service: 12/02/2017 3:30 PM Medical Record Number: [id number] Patient Account Number: [id number] Date of Birth/Sex: 1971-05-08 (46 y.o. Male) Treating RN: Roger Shelter Primary Care Taris Galindo: Einar Pheasant Other Clinician: Referring Nikolas Casher: Einar Pheasant Treating Jimmie Dattilio/Extender: Frann Rider in Treatment: 3 Clinic Level of Care Assessment Items TOOL 4 Quantity Score []  - Use when only an EandM is performed on FOLLOW-UP visit 0 ASSESSMENTS - Nursing Assessment / Reassessment X - Reassessment of Co-morbidities (includes updates in patient status) 1 10 X- 1 5 Reassessment of Adherence to  Treatment Plan ASSESSMENTS - Wound and Skin Assessment / Reassessment X - Simple Wound Assessment / Reassessment - one wound 1 5 []  - 0 Complex Wound Assessment / Reassessment - multiple wounds []  - 0 Dermatologic / Skin Assessment (not related to wound area) ASSESSMENTS - Focused Assessment []  - Circumferential Edema Measurements - multi extremities 0 []  - 0 Nutritional Assessment / Counseling / Intervention []  - 0 Lower Extremity Assessment (monofilament, tuning fork, pulses) []  - 0 Peripheral Arterial Disease Assessment (using hand held doppler) ASSESSMENTS - Ostomy and/or Continence Assessment and Care []  - Incontinence Assessment and Management 0 []  - 0 Ostomy Care Assessment and Management (repouching, etc.) PROCESS - Coordination of Care X - Simple Patient / Family Education for ongoing care 1 15 []  - 0 Complex (extensive) Patient / Family Education for ongoing care []  - 0 Staff obtains Programmer, systems, Records, Test Results / Process Orders []  - 0 Staff telephones HHA, Nursing Homes / Clarify orders / etc []  - 0 Routine Transfer to another Facility (non-emergent condition) []  - 0 Routine Hospital Admission (non-emergent condition) []  - 0 New Admissions / Biomedical engineer / Ordering NPWT, Apligraf, etc. []  - 0 Emergency Hospital Admission (emergent condition) X- 1 10 Simple Discharge Coordination Saad, [name] ([id number]) []  - 0 Complex (extensive) Discharge Coordination PROCESS - Special Needs []  - Pediatric / Minor Patient Management 0 []  - 0 Isolation Patient Management []  - 0 Hearing / Language / Visual special needs []  - 0 Assessment of Community assistance (transportation, D/C planning, etc.) []  - 0 Additional assistance / Altered mentation []  - 0 Support Surface(s) Assessment (bed, cushion, seat, etc.) INTERVENTIONS - Wound Cleansing / Measurement X - Simple Wound Cleansing - one wound 1 5 []  - 0 Complex Wound Cleansing - multiple wounds X- 1  5 Wound Imaging (photographs - any number of wounds) []  - 0 Wound Tracing (instead of photographs) X- 1 5 Simple Wound Measurement -  one wound []  - 0 Complex Wound Measurement - multiple wounds INTERVENTIONS - Wound Dressings X - Small Wound Dressing one or multiple wounds 1 10 []  - 0 Medium Wound Dressing one or multiple wounds []  - 0 Large Wound Dressing one or multiple wounds []  - 0 Application of Medications - topical []  - 0 Application of Medications - injection INTERVENTIONS - Miscellaneous []  - External ear exam 0 []  - 0 Specimen Collection (cultures, biopsies, blood, body fluids, etc.) []  - 0 Specimen(s) / Culture(s) sent or taken to Lab for analysis []  - 0 Patient Transfer (multiple staff / Civil Service fast streamer / Similar devices) []  - 0 Simple Staple / Suture removal (25 or less) []  - 0 Complex Staple / Suture removal (26 or more) []  - 0 Hypo / Hyperglycemic Management (close monitor of Blood Glucose) []  - 0 Ankle / Brachial Index (ABI) - do not check if billed separately X- 1 5 Vital Signs Cervi, [name] ([id number]) Has the patient been seen at the hospital within the last three years: Yes Total Score: 75 Level Of Care: New/Established - Level 2 Electronic Signature(s) Signed: 12/03/2017 2:36:38 PM By: Roger Shelter Entered By: Roger Shelter on 12/02/2017 16:06:09 [place]adri, [name] ([id number]) -------------------------------------------------------------------------------- Encounter Discharge Information Details Patient Name: [name]. Date of Service: 12/02/2017 3:30 PM Medical Record Number: [id number] Patient Account Number: [id number] Date of Birth/Sex: August 22, 1971 (46 y.o. Male) Treating RN: Roger Shelter Primary Care Ravyn Nikkel: Einar Pheasant Other Clinician: Referring Virginie Josten: Einar Pheasant Treating Dynver Clemson/Extender: Frann Rider in Treatment: 3 Encounter Discharge Information Items Discharge Pain Level: 0 Discharge Condition:  Stable Ambulatory Status: Wheelchair Discharge Destination: Home Transportation: Private Auto Accompanied By: self Schedule Follow-up Appointment: Yes Medication Reconciliation completed and No provided to Patient/Care Megean Fabio: Patient Clinical Summary of Care: Declined Electronic Signature(s) Signed: 12/03/2017 2:37:55 PM By: Ruthine Dose Entered By: Ruthine Dose on 12/02/2017 16:10:11 [place], [name] ([id number]) -------------------------------------------------------------------------------- Lower Extremity Assessment Details Patient Name: [name]. Date of Service: 12/02/2017 3:30 PM Medical Record Number: [id number] Patient Account Number: [id number] Date of Birth/Sex: [birth year] (46 y.o. Male) Treating RN: Roger Shelter Primary Care Kiyani Jernigan: Einar Pheasant Other Clinician: Referring Ciearra Rufo: Einar Pheasant Treating Ever Halberg/Extender: Frann Rider in Treatment: 3 Edema Assessment Assessed: [Left: No] [Right: No] Edema: [Left: N] [Right: o] Vascular Assessment Claudication: Claudication Assessment [Left:None] Pulses: Dorsalis Pedis Palpable: [Left:Yes] Posterior Tibial Extremity colors, hair growth, and conditions: Extremity Color: [Left:Normal] Hair Growth on Extremity: [Left:Yes] Temperature of Extremity: [Left:Cool] Capillary Refill: [Left:< 3 seconds] Toe Nail Assessment Left: Right: Thick: Yes Discolored: Yes Deformed: Yes Improper Length and Hygiene: Yes Electronic Signature(s) Signed: 12/03/2017 2:36:38 PM By: Roger Shelter Entered By: Roger Shelter on 12/02/2017 15:40:26 [place], [name] ([id number]) -------------------------------------------------------------------------------- Multi Wound Chart Details Patient Name: [name]. Date of Service: 12/02/2017 3:30 PM Medical Record Number: [id number] Patient Account Number: [id number] Date of Birth/Sex: Jan 25, 1971 (46 y.o. 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] [N/A:N/A] Comorbid History: [6:Type I Diabetes, History of pressure wounds, Paraplegia] [N/A:N/A] Date Acquired: [6:10/25/2017] [N/A:N/A] Weeks of Treatment: [6:3] [N/A:N/A] Wound Status: [6:Open] [N/A:N/A] Pending Amputation on [6:Yes] [N/A:N/A] Presentation: Measurements L x W  x D [6:0.6x1x0.2] [N/A:N/A] (cm) Area (cm) : [6:0.471] [N/A:N/A] Volume (cm) : [6:0.094] [N/A:N/A] % Reduction in Area: [6:50.00%] [N/A:N/A] % Reduction in Volume: [6:50.00%] [N/A:N/A] Classification: [6:Grade 1] [N/A:N/A] Exudate Amount: [6:Small] [N/A:N/A] Exudate Type: [6:Serosanguineous] [N/A:N/A] Exudate Color: [6:red, brown] [N/A:N/A] Wound Margin: [6:Flat and Intact] [N/A:N/A] Granulation Amount: [6:Medium (34-66%)] [N/A:N/A] Granulation Quality: [6:Red] [N/A:N/A] Necrotic Amount: [6:Medium (34-66%)] [N/A:N/A] Necrotic Tissue: [6:Eschar, Adherent Slough] [N/A:N/A] Exposed Structures: [6:Fascia: No Fat Layer (Subcutaneous Tissue) Exposed: No Tendon: No Muscle: No Joint: No Bone: No] [N/A:N/A] Epithelialization: [6:None] [N/A:N/A] Periwound Skin Texture: [N/A:N/A] Excoriation: No Induration: No Callus: No Crepitus: No Rash: No Scarring: No Periwound Skin Moisture: Maceration: No N/A N/A Dry/Scaly: No Periwound Skin Color: Atrophie Blanche: No N/A N/A Cyanosis: No Ecchymosis: No Erythema: No Hemosiderin Staining: No Mottled: No Pallor:  No Rubor: No Temperature: No Abnormality N/A N/A Tenderness on Palpation: Yes N/A N/A Wound Preparation: Ulcer Cleansing: N/A N/A Rinsed/Irrigated with Saline Topical Anesthetic Applied: Other: lidocaine 4% Treatment Notes Wound #6 (Left Calcaneus) 1. Cleansed with: Clean wound with Normal Saline 2. Anesthetic Topical Lidocaine 4% cream to wound bed prior to debridement 4. Dressing Applied: Prisma Ag 5. Secondary Dressing Applied Bordered Foam Dressing Electronic Signature(s) Signed: 12/02/2017 4:29:20 PM By: Christin Fudge MD, FACS Entered By: Christin Fudge on 12/02/2017 16:29:19 [place], [name] ([id number]) -------------------------------------------------------------------------------- Export Details Patient Name: [name]. Date of Service: 12/02/2017 3:30 PM Medical Record Number: [id number] Patient Account Number: [id number] Date of Birth/Sex: [birth year] (46 y.o. Male) Treating RN: Roger Shelter Primary Care Arthur Aydelotte: Einar Pheasant Other Clinician: Referring Dasean Brow: Einar Pheasant Treating Percell Lamboy/Extender: Frann Rider in Treatment: 3 Active Inactive ` Orientation to the Wound Care Program Nursing Diagnoses: Knowledge deficit related to the wound healing center program Goals: Patient/caregiver will verbalize understanding of the [facility] Program Date Initiated: 11/08/2017 Target Resolution Date: 02/12/2018 Goal Status: Active Interventions: Provide education on orientation to the wound center Notes: ` Pressure Nursing Diagnoses: Potential for impaired tissue integrity related to pressure, friction, moisture, and shear Goals: Patient will remain free from development of additional pressure ulcers Date Initiated: 11/08/2017 Target Resolution Date: 02/12/2018 Goal Status: Active Interventions: Provide education on pressure ulcers Notes: ` Wound/Skin Impairment Nursing Diagnoses: Impaired tissue  integrity Goals: Ulcer/skin breakdown will heal within 14 weeks Date Initiated: 11/08/2017 Target Resolution Date: 02/12/2018 Goal Status: Active Interventions: SULAIMAN, IMBERT ([id number]) Assess patient/caregiver ability to obtain necessary supplies Assess patient/caregiver ability to perform ulcer/skin care regimen upon admission and as needed Assess ulceration(s) every visit Notes: Electronic Signature(s) Signed: 12/03/2017 2:36:38 PM By: Roger Shelter Entered By: Roger Shelter on 12/02/2017 15:56:23 [place], [name] ([id number]) -------------------------------------------------------------------------------- Pain Assessment Details Patient Name: [name]. Date of Service: 12/02/2017 3:30 PM Medical Record Number: [id number] Patient Account Number: [id number] Date of Birth/Sex: 1971-02-14 (46 y.o. Male) Treating RN: Roger Shelter Primary Care Maxx Calaway: Einar Pheasant Other Clinician: Referring Zoeann Mol: Einar Pheasant Treating Aydan Phoenix/Extender: Frann Rider in Treatment: 3 Active Problems Location of Pain Severity and Description of Pain Patient Has Paino No Site Locations Pain Management and Medication Current Pain Management: Electronic Signature(s) Signed: 12/03/2017 2:36:38 PM By: Roger Shelter Entered By: Roger Shelter on 12/02/2017 15:33:41 [place], [name] ([id number]) -------------------------------------------------------------------------------- Patient/Caregiver Education Details Patient Name: [name]. Date of Service: 12/02/2017 3:30 PM Medical Record Number: [id number] Patient Account Number: [id number] Date of Birth/Gender: 03-12-1971 (46 y.o. Male) Treating RN: Roger Shelter Primary Care Physician: Einar Pheasant Other Clinician: Referring Physician: Einar Pheasant Treating Physician/Extender: Frann Rider in  Treatment: 3 Education Assessment Education Provided To: Patient Education Topics  Provided Wound/Skin Impairment: Handouts: Caring for Your Ulcer Methods: Explain/Verbal Responses: State content correctly Electronic Signature(s) Signed: 12/03/2017 2:36:38 PM By: Roger Shelter Entered By: Roger Shelter on 12/02/2017 15:55:44 [place], [name] ([id number]) -------------------------------------------------------------------------------- Wound Assessment Details Patient Name: [name]. Date of Service: 12/02/2017 3:30 PM Medical Record Number: [id number] Patient Account Number: [id number] Date of Birth/Sex: 10-04-1971 (46 y.o. Male) Treating RN: Roger Shelter Primary Care Merrill Villarruel: Einar Pheasant Other Clinician: Referring Kashana Breach: Einar Pheasant Treating Kelen Laura/Extender: Frann Rider in Treatment: 3 Wound Status Wound Number: 6 Primary Etiology: Diabetic Wound/Ulcer of the Lower Extremity Wound Location: Left Calcaneus Secondary Pressure Ulcer Wounding Event: Pressure Injury Etiology: Date Acquired: 10/25/2017 Wound Status: Open Weeks Of Treatment: 3 Comorbid Type I Diabetes, History of pressure Clustered Wound: No History: wounds, Paraplegia Pending Amputation On Presentation Photos Photo Uploaded By: Roger Shelter on 12/02/2017 16:49:26 Wound Measurements Length: (cm) 0.6 Width: (cm) 1 Depth: (cm) 0.2 Area: (cm) 0.471 Volume: (cm) 0.094 % Reduction in Area: 50% % Reduction in Volume: 50% Epithelialization: None Tunneling: No Undermining: No Wound Description Classification: Grade 1 Foul Wound Margin: Flat and Intact Slou Exudate Amount: Small Exudate Type: Serosanguineous Exudate Color: red, brown Odor After Cleansing: No gh/Fibrino Yes Wound Bed Granulation Amount: Medium (34-66%) Exposed Structure Granulation Quality: Red Fascia Exposed: No Necrotic Amount: Medium (34-66%) Fat Layer (Subcutaneous Tissue) Exposed: No Necrotic Quality: Eschar, Adherent Slough Tendon Exposed: No Muscle Exposed: No Joint  Exposed: No Bone Exposed: No Periwound Skin Texture Kaatz, [name] ([id number]) Texture Color No Abnormalities Noted: No No Abnormalities Noted: No Callus: No Atrophie Blanche: No Crepitus: No Cyanosis: No Excoriation: No Ecchymosis: No Induration: No Erythema: No Rash: No Hemosiderin Staining: No Scarring: No Mottled: No Pallor: No Moisture Rubor: No No Abnormalities Noted: No Dry / Scaly: No Temperature / Pain Maceration: No Temperature: No Abnormality Tenderness on Palpation: Yes Wound Preparation Ulcer Cleansing: Rinsed/Irrigated with Saline Topical Anesthetic Applied: Other: lidocaine 4%, Treatment Notes Wound #6 (Left Calcaneus) 1. Cleansed with: Clean wound with Normal Saline 2. Anesthetic Topical Lidocaine 4% cream to wound bed prior to debridement 4. Dressing Applied: Prisma Ag 5. Secondary Dressing Applied Bordered Foam Dressing Electronic Signature(s) Signed: 12/03/2017 2:36:38 PM By: Roger Shelter Entered By: Roger Shelter on 12/02/2017 15:39:39 [place], [name] ([id number]) -------------------------------------------------------------------------------- Vitals Details Patient Name: [name]. Date of Service: 12/02/2017 3:30 PM Medical Record Number: [id number] Patient Account Number: [id number] Date of Birth/Sex: [birth year] (46 y.o. Male) Treating RN: Roger Shelter Primary Care Oskar Cretella: Einar Pheasant Other Clinician: Referring Rishon Thilges: Einar Pheasant Treating Megon Kalina/Extender: Frann Rider in Treatment: 3 Vital Signs Time Taken: 03:33 Temperature (F): 97.5 Pulse (bpm): 56 Respiratory Rate (breaths/min): 16 Blood Pressure (mmHg): 102/53 Reference Range: 80 - 120 mg / dl Electronic Signature(s) Signed: 12/03/2017 2:36:38 PM By: Roger Shelter Entered By: Roger Shelter on 12/02/2017 15:35:20
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
## 2017-12-08 DIAGNOSIS — N390 Urinary tract infection, site not specified: Secondary | ICD-10-CM | POA: Diagnosis not present

## 2017-12-23 ENCOUNTER — Encounter: Payer: Medicare Other | Attending: Nurse Practitioner | Admitting: Nurse Practitioner

## 2017-12-23 DIAGNOSIS — G8929 Other chronic pain: Secondary | ICD-10-CM | POA: Insufficient documentation

## 2017-12-23 DIAGNOSIS — L97422 Non-pressure chronic ulcer of left heel and midfoot with fat layer exposed: Secondary | ICD-10-CM | POA: Diagnosis not present

## 2017-12-23 DIAGNOSIS — I10 Essential (primary) hypertension: Secondary | ICD-10-CM | POA: Diagnosis not present

## 2017-12-23 DIAGNOSIS — L89613 Pressure ulcer of right heel, stage 3: Secondary | ICD-10-CM | POA: Diagnosis not present

## 2017-12-23 DIAGNOSIS — F329 Major depressive disorder, single episode, unspecified: Secondary | ICD-10-CM | POA: Insufficient documentation

## 2017-12-23 DIAGNOSIS — Z794 Long term (current) use of insulin: Secondary | ICD-10-CM | POA: Insufficient documentation

## 2017-12-23 DIAGNOSIS — Z882 Allergy status to sulfonamides status: Secondary | ICD-10-CM | POA: Insufficient documentation

## 2017-12-23 DIAGNOSIS — G8254 Quadriplegia, C5-C7 incomplete: Secondary | ICD-10-CM | POA: Diagnosis not present

## 2017-12-23 DIAGNOSIS — E11621 Type 2 diabetes mellitus with foot ulcer: Secondary | ICD-10-CM | POA: Insufficient documentation

## 2017-12-25 NOTE — Progress Notes (Addendum)
GUMECINDO, HOPKIN ([id number]) Visit Report for 12/23/2017 Arrival Information Details Patient Name: [name], [name]. Date of Service: 12/23/2017 3:30 PM Medical Record Number: [id number] Patient Account Number: [id number] Date of Birth/Sex: [birth year] (47 y.o. Male) Treating RN: Cornell Barman Primary Care Marrianne Sica: Einar Pheasant Other Clinician: Referring Minami Arriaga: Einar Pheasant Treating Kenyona Rena/Extender: Cathie Olden in Treatment: 6 Visit Information History Since Last Visit Added or deleted any medications: No Patient Arrived: Wheel Chair Any new allergies or adverse reactions: No Arrival Time: 15:58 Had a fall or experienced change in No Accompanied By: self activities of daily living that may affect Transfer Assistance: Manual risk of falls: Patient Identification Verified: Yes Signs or symptoms of abuse/neglect since last visito No Secondary Verification Process Completed: Yes Hospitalized since last visit: No Patient Has Alerts: Yes Has Dressing in Place as Prescribed: Yes Patient Alerts: DMI Pain Present Now: No Electronic Signature(s) Signed: 12/23/2017 5:49:16 PM By: Gretta Cool, BSN, RN, CWS, Kim RN, BSN Entered By: Gretta Cool, BSN, RN, CWS, Kim on 12/23/2017 16:01:35 [place], [name] ([id number]) -------------------------------------------------------------------------------- Encounter Discharge Information Details Patient Name: [name]. Date of Service: 12/23/2017 3:30 PM Medical Record Number: [id number] Patient Account Number: [id number] Date of Birth/Sex: Jul 20, 1971 (47 y.o. Male) Treating RN: Cornell Barman Primary Care Kregg Cihlar: Einar Pheasant Other Clinician: Referring Punam Broussard: Einar Pheasant Treating Jair Lindblad/Extender: Cathie Olden in Treatment: 6 Encounter Discharge Information Items Discharge Pain Level: Insensate Discharge Condition: Stable Ambulatory Status: Wheelchair Discharge Destination: Home Private Transportation: Auto Accompanied  By: self Schedule Follow-up Appointment: Yes Medication Reconciliation completed and provided Yes to Patient/Care Holden Maniscalco: Clinical Summary of Care: Electronic Signature(s) Signed: 12/23/2017 5:55:20 PM By: Gretta Cool, BSN, RN, CWS, Kim RN, BSN Entered By: Gretta Cool, BSN, RN, CWS, Kim on 12/23/2017 17:55:20 [place], [name] ([id number]) -------------------------------------------------------------------------------- Lower Extremity Assessment Details Patient Name: [name]. Date of Service: 12/23/2017 3:30 PM Medical Record Number: [id number] Patient Account Number: [id number] Date of Birth/Sex: 1971/08/11 (47 y.o. Male) Treating RN: Cornell Barman Primary Care Averill Pons: Einar Pheasant Other Clinician: Referring Rockford Leinen: Einar Pheasant Treating Janis Sol/Extender: Cathie Olden in Treatment: 6 Vascular Assessment Pulses: Dorsalis Pedis Palpable: [Left:Yes] Posterior Tibial Extremity colors, hair growth, and conditions: Extremity Color: [Left:Pale] Hair Growth on Extremity: [Left:No] Temperature of Extremity: [Left:Cold] Capillary Refill: [Left:< 3 seconds] Toe Nail Assessment Left: Right: Thick: No Discolored: No Deformed: No Improper Length and Hygiene: No Electronic Signature(s) Signed: 12/23/2017 5:49:16 PM By: Gretta Cool, BSN, RN, CWS, Kim RN, BSN Entered By: Gretta Cool, BSN, RN, CWS, Kim on 12/23/2017 [place], [name] ([id number]) -------------------------------------------------------------------------------- Multi Wound Chart Details Patient Name: [name]. Date of Service: 12/23/2017 3:30 PM Medical Record Number: [id number] Patient Account Number: [id number] Date of Birth/Sex: [birth year] (47 y.o. 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] [N/A:N/A] Comorbid History: [6:Type I Diabetes, History of pressure wounds, Paraplegia] [N/A:N/A] Date Acquired: [6:10/25/2017] [N/A:N/A] Weeks of Treatment: [6:6] [N/A:N/A] Wound Status: [6:Open] [N/A:N/A] Pending Amputation on [6:Yes] [N/A:N/A] Presentation: Measurements L x W x D [6:0.2x0.4x0.1] [N/A:N/A] (cm) Area (cm) : [6:0.063] [N/A:N/A] Volume (cm) : [6:0.006] [N/A:N/A] % Reduction in Area: [6:93.30%] [N/A:N/A] % Reduction in Volume: [6:96.80%] [N/A:N/A] Classification: [6:Grade 1] [N/A:N/A] Exudate Amount: [  6:Small] [N/A:N/A] Exudate Type: [6:Serosanguineous] [N/A:N/A] Exudate Color: [6:red, brown] [N/A:N/A] Wound Margin: [6:Flat and Intact] [N/A:N/A] Granulation Amount: [6:Medium (34-66%)] [N/A:N/A] Granulation Quality: [6:Red] [N/A:N/A] Necrotic Amount: [6:Medium (34-66%)] [N/A:N/A] Exposed Structures: [6:Fascia: No Fat Layer (Subcutaneous Tissue) Exposed: No Tendon: No Muscle: No Joint: No Bone: No Limited to Skin Breakdown] [N/A:N/A] Epithelialization: [6:None] [N/A:N/A] Debridement: [6:Debridement (11042-11047)] [N/A:N/A] Pre-procedure 16:09 N/A N/A Verification/Time Out Taken: Pain Control: Other N/A N/A Tissue Debrided: Necrotic/Eschar, N/A N/A Fibrin/Slough, Subcutaneous Level: Skin/Subcutaneous Tissue N/A N/A Debridement Area (sq cm): 0.08 N/A N/A Instrument: Curette N/A N/A Bleeding: Minimum N/A N/A Hemostasis Achieved: Pressure N/A N/A Procedural Pain: Insensate N/A N/A Post Procedural Pain: Insensate N/A N/A Debridement Treatment Procedure was tolerated well N/A N/A Response: Post Debridement 0.2x0.4x0.2 N/A  N/A Measurements L x W x D (cm) Post Debridement Volume: 0.013 N/A N/A (cm) Periwound Skin Texture: Excoriation: No N/A N/A Induration: No Callus: No Crepitus: No Rash: No Scarring: No Periwound Skin Moisture: Maceration: No N/A N/A Dry/Scaly: No Periwound Skin Color: Atrophie Blanche: No N/A N/A Cyanosis: No Ecchymosis: No Erythema: No Hemosiderin Staining: No Mottled: No Pallor: No Rubor: No Temperature: No Abnormality N/A N/A Tenderness on Palpation: Yes N/A N/A Wound Preparation: Ulcer Cleansing: N/A N/A Rinsed/Irrigated with Saline Topical Anesthetic Applied: Other: lidocaine 4% Procedures Performed: Debridement N/A N/A Treatment Notes Electronic Signature(s) Signed: 12/24/2017 8:35:48 AM By: Lawanda Cousins Entered By: Lawanda Cousins on 12/23/2017 16:54:38 [place], [name] ([id number]) -------------------------------------------------------------------------------- [place] Details Patient Name: [name]. Date of Service: 12/23/2017 3:30 PM Medical Record Number: [id number] Patient Account Number: [id number] Date of Birth/Sex: [birth year] (47 y.o. Male) Treating RN: Cornell Barman Primary Care Ambar Raphael: Einar Pheasant Other Clinician: Referring Tenlee Wollin: Einar Pheasant Treating Jhoselin Crume/Extender: Cathie Olden in Treatment: 6 Active Inactive Electronic Signature(s) Signed: 01/17/2018 3:41:01 PM By: Gretta Cool, BSN, RN, CWS, Kim RN, BSN Previous Signature: 12/23/2017 5:49:16 PM Version By: Gretta Cool, BSN, RN, CWS, Kim RN, BSN Entered By: Gretta Cool, BSN, RN, CWS, Kim on 01/17/2018 15:41:00 [place], [name] ([id number]) -------------------------------------------------------------------------------- Pain Assessment Details Patient Name: [name]. Date of Service: 12/23/2017 3:30 PM Medical Record Number: [id number] Patient Account Number: [id number] Date of Birth/Sex: Apr 04, 1971 (47 y.o. Male) Treating RN: Cornell Barman Primary Care Jaysiah Marchetta:  Einar Pheasant Other Clinician: Referring Rose Hegner: Einar Pheasant Treating Hagan Vanauken/Extender: Cathie Olden in Treatment: 6 Active Problems Location of Pain Severity and Description of Pain Patient Has Paino No Site Locations With Dressing Change: No Pain Management and Medication Current Pain Management: Electronic Signature(s) Signed: 12/23/2017 5:49:16 PM By: Gretta Cool, BSN, RN, CWS, Kim RN, BSN Entered By: Gretta Cool, BSN, RN, CWS, Kim on 12/23/2017 16:01:49 [place], [name] ([id number]) -------------------------------------------------------------------------------- Patient/Caregiver Education Details Patient Name: [name]. Date of Service: 12/23/2017 3:30 PM Medical Record Number: [id number] Patient Account Number: [id number] Date of Birth/Gender: 1971/01/25 (47 y.o. Male) Treating RN: Cornell Barman Primary Care Physician: Einar Pheasant Other Clinician: Referring Physician: Einar Pheasant Treating Physician/Extender: Cathie Olden in Treatment: 6 Education Assessment Education Provided To: Patient Education Topics Provided Wound/Skin Impairment: Handouts: Caring for Your Ulcer Methods: Demonstration, Explain/Verbal Responses: State content correctly Electronic Signature(s) Signed: 12/23/2017 5:56:46 PM By: Gretta Cool, BSN, RN, CWS, Kim RN, BSN Entered By: Gretta Cool, BSN, RN, CWS, Kim on 12/23/2017 17:55:34 [place], [name] ([id number]) -------------------------------------------------------------------------------- Wound Assessment Details Patient Name: [name]. Date of Service: 12/23/2017 3:30 PM Medical Record Number: [id number] Patient Account Number: [id number] Date of Birth/Sex: 09-14-1971 (47 y.o. Male) Treating RN: Cornell Barman Primary Care Fed Ceci: Einar Pheasant Other Clinician: Referring  Jessicca Stitzer: Einar Pheasant Treating Haileyann Staiger/Extender: Cathie Olden in Treatment: 6 Wound Status Wound Number: 6 Primary Etiology: Diabetic Wound/Ulcer of  the Lower Extremity Wound Location: Left Calcaneus Secondary Pressure Ulcer Wounding Event: Pressure Injury Etiology: Date Acquired: 10/25/2017 Wound Status: Open Weeks Of Treatment: 6 Comorbid Type I Diabetes, History of pressure Clustered Wound: No History: wounds, Paraplegia Pending Amputation On Presentation Photos Photo Uploaded By: Gretta Cool, BSN, RN, CWS, Kim on 12/23/2017 17:22:34 Wound Measurements Length: (cm) 0.2 Width: (cm) 0.4 Depth: (cm) 0.1 Area: (cm) 0.063 Volume: (cm) 0.006 % Reduction in Area: 93.3% % Reduction in Volume: 96.8% Epithelialization: None Tunneling: No Undermining: No Wound Description Classification: Grade 1 Foul Odor Wound Margin: Flat and Intact Slough/Fi Exudate Amount: Small Exudate Type: Serosanguineous Exudate Color: red, brown After Cleansing: No brino Yes Wound Bed Granulation Amount: Medium (34-66%) Exposed Structure Granulation Quality: Red Fascia Exposed: No Necrotic Amount: Medium (34-66%) Fat Layer (Subcutaneous Tissue) Exposed: No Necrotic Quality: Adherent Slough Tendon Exposed: No Muscle Exposed: No Joint Exposed: No Bone Exposed: No Limited to Skin Breakdown Periwound Skin Texture Texture Color [place], [name] ([id number]) No Abnormalities Noted: No No Abnormalities Noted: No Callus: No Atrophie Blanche: No Crepitus: No Cyanosis: No Excoriation: No Ecchymosis: No Induration: No Erythema: No Rash: No Hemosiderin Staining: No Scarring: No Mottled: No Pallor: No Moisture Rubor: No No Abnormalities Noted: No Dry / Scaly: No Temperature / Pain Maceration: No Temperature: No Abnormality Tenderness on Palpation: Yes Wound Preparation Ulcer Cleansing: Rinsed/Irrigated with Saline Topical Anesthetic Applied: Other: lidocaine 4%, Electronic Signature(s) Signed: 12/23/2017 5:49:16 PM By: Gretta Cool, BSN, RN, CWS, Kim RN, BSN Entered By: Gretta Cool, BSN, RN, CWS, Kim on 12/23/2017 16:07:45 [place], [name]  ([id number]) -------------------------------------------------------------------------------- Vitals Details Patient Name: [name]. Date of Service: 12/23/2017 3:30 PM Medical Record Number: [id number] Patient Account Number: [id number] Date of Birth/Sex: July 07, 1971 (47 y.o. Male) Treating RN: Cornell Barman Primary Care Arzell Mcgeehan: Einar Pheasant Other Clinician: Referring Alichia Alridge: Einar Pheasant Treating Yaire Kreher/Extender: Cathie Olden in Treatment: 6 Vital Signs Time Taken: 16:01 Temperature (F): 97.7 Pulse (bpm): 56 Respiratory Rate (breaths/min): 16 Blood Pressure (mmHg): 106/76 Reference Range: 80 - 120 mg / dl Electronic Signature(s) Signed: 12/23/2017 5:49:16 PM By: Gretta Cool, BSN, RN, CWS, Kim RN, BSN Entered By: Gretta Cool, BSN, RN, CWS, Kim on 12/23/2017 16:02:24

## 2017-12-25 NOTE — Progress Notes (Signed)
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 16:56:21

## 2018-01-13 ENCOUNTER — Ambulatory Visit: Payer: Medicare Other | Admitting: Nurse Practitioner

## 2018-02-09 ENCOUNTER — Other Ambulatory Visit: Payer: Self-pay | Admitting: Internal Medicine

## 2018-02-16 ENCOUNTER — Other Ambulatory Visit: Payer: Self-pay | Admitting: Internal Medicine

## 2018-02-16 ENCOUNTER — Other Ambulatory Visit
Admission: RE | Admit: 2018-02-16 | Discharge: 2018-02-16 | Disposition: A | Discharge status: Home / Self Care | Payer: Medicare Other | Source: Ambulatory Visit | Attending: Internal Medicine | Admitting: Internal Medicine

## 2018-02-16 ENCOUNTER — Ambulatory Visit
Admission: RE | Admit: 2018-02-16 | Discharge: 2018-02-16 | Disposition: A | Discharge status: Home / Self Care | Payer: Medicare Other | Source: Ambulatory Visit | Attending: Internal Medicine | Admitting: Internal Medicine

## 2018-02-16 ENCOUNTER — Encounter: Payer: Medicare Other | Attending: Internal Medicine | Admitting: Internal Medicine

## 2018-02-16 DIAGNOSIS — Z981 Arthrodesis status: Secondary | ICD-10-CM | POA: Diagnosis not present

## 2018-02-16 DIAGNOSIS — G8254 Quadriplegia, C5-C7 incomplete: Secondary | ICD-10-CM | POA: Insufficient documentation

## 2018-02-16 DIAGNOSIS — E10621 Type 1 diabetes mellitus with foot ulcer: Secondary | ICD-10-CM | POA: Insufficient documentation

## 2018-02-16 DIAGNOSIS — I10 Essential (primary) hypertension: Secondary | ICD-10-CM | POA: Diagnosis not present

## 2018-02-16 DIAGNOSIS — Z91041 Radiographic dye allergy status: Secondary | ICD-10-CM | POA: Insufficient documentation

## 2018-02-16 DIAGNOSIS — L89623 Pressure ulcer of left heel, stage 3: Secondary | ICD-10-CM | POA: Diagnosis not present

## 2018-02-16 DIAGNOSIS — L97909 Non-pressure chronic ulcer of unspecified part of unspecified lower leg with unspecified severity: Secondary | ICD-10-CM | POA: Diagnosis not present

## 2018-02-16 DIAGNOSIS — G8929 Other chronic pain: Secondary | ICD-10-CM | POA: Diagnosis not present

## 2018-02-16 DIAGNOSIS — Z794 Long term (current) use of insulin: Secondary | ICD-10-CM | POA: Insufficient documentation

## 2018-02-16 DIAGNOSIS — Z87442 Personal history of urinary calculi: Secondary | ICD-10-CM | POA: Diagnosis not present

## 2018-02-16 DIAGNOSIS — L089 Local infection of the skin and subcutaneous tissue, unspecified: Secondary | ICD-10-CM | POA: Diagnosis not present

## 2018-02-16 DIAGNOSIS — Z8249 Family history of ischemic heart disease and other diseases of the circulatory system: Secondary | ICD-10-CM | POA: Diagnosis not present

## 2018-02-16 DIAGNOSIS — X58XXXA Exposure to other specified factors, initial encounter: Secondary | ICD-10-CM | POA: Insufficient documentation

## 2018-02-16 DIAGNOSIS — B999 Unspecified infectious disease: Secondary | ICD-10-CM | POA: Insufficient documentation

## 2018-02-16 DIAGNOSIS — S91302A Unspecified open wound, left foot, initial encounter: Secondary | ICD-10-CM | POA: Insufficient documentation

## 2018-02-16 DIAGNOSIS — M85872 Other specified disorders of bone density and structure, left ankle and foot: Secondary | ICD-10-CM | POA: Diagnosis not present

## 2018-02-16 DIAGNOSIS — Z882 Allergy status to sulfonamides status: Secondary | ICD-10-CM | POA: Insufficient documentation

## 2018-02-16 DIAGNOSIS — Z888 Allergy status to other drugs, medicaments and biological substances status: Secondary | ICD-10-CM | POA: Diagnosis not present

## 2018-02-17 NOTE — Progress Notes (Signed)
MANRAJ, YEO ([id number]) Visit Report for 02/16/2018 Abuse/Suicide Risk Screen Details Patient Name: [name], [name]. Date of Service: 02/16/2018 12:30 PM Medical Record Number: [id number] Patient Account Number: [id number] Date of Birth/Sex: 1971-11-05 (47 y.o. Male) Treating RN: Ahmed Prima Primary Care Meara Wiechman: Einar Pheasant Other Clinician: Referring Arthurine Oleary: Referral, Self Treating Dharma Pare/Extender: Ricard Dillon Weeks in Treatment: 0 Abuse/Suicide Risk Screen Items Answer ABUSE/SUICIDE RISK SCREEN: Has anyone close to you tried to hurt or harm you recentlyo No Do you feel uncomfortable with anyone in your familyo No Has anyone forced you do things that you didnot want to doo No Do you have any thoughts of harming yourselfo No Patient displays signs or symptoms of abuse and/or neglect. No Electronic Signature(s) Signed: 02/16/2018 4:40:08 PM By: Alric Quan Entered By: Alric Quan on 02/16/2018 12:43:52 [place], [name] ([id number]) -------------------------------------------------------------------------------- Activities of Daily Living Details Patient Name: [name]. Date of Service: 02/16/2018 12:30 PM Medical Record Number: [id number] Patient Account Number: [id number] Date of Birth/Sex: 08/08/1971 (47 y.o. Male) Treating RN: Ahmed Prima Primary Care Sherrick Araki: Einar Pheasant Other Clinician: Referring Viral Schramm: Referral, Self Treating Marilene Vath/Extender: Ricard Dillon Weeks in Treatment: 0 Activities of Daily Living Items Answer Activities of Daily Living (Please select one for each item) Drive Automobile Completely Able Take Medications Completely Able Use Telephone Completely Able Care for Appearance Completely Able Use Toilet Completely Able Bath / Shower Completely Able Dress Self Completely Able Feed Self Completely Able Walk Not Able Get In / Out Bed Completely Able Housework Completely Able Prepare Meals Completely  [place] for Self Completely Able Electronic Signature(s) Signed: 02/16/2018 4:40:08 PM By: Alric Quan Entered By: Alric Quan on 02/16/2018 12:44:26 [place], [name] ([id number]) -------------------------------------------------------------------------------- Education Assessment Details Patient Name: [name]. Date of Service: 02/16/2018 12:30 PM Medical Record Number: [id number] Patient Account Number: [id number] Date of Birth/Sex: 1971/04/03 (47 y.o. Male) Treating RN: Ahmed Prima Primary Care Kaeley Vinje: Einar Pheasant Other Clinician: Referring Tauri Ethington: Referral, Self Treating Rashana Andrew/Extender: Tito Dine in Treatment: 0 Primary Learner Assessed: Patient Learning Preferences/Education Level/Primary Language Learning Preference: Explanation, Printed Material Highest Education Level: College or Above Preferred Language: English Cognitive Barrier Assessment/Beliefs Language Barrier: No Translator Needed: No Memory Deficit: No Emotional Barrier: No Cultural/Religious Beliefs Affecting Medical Care: No Physical Barrier Assessment Impaired Vision: No Impaired Hearing: No Decreased Hand dexterity: No Knowledge/Comprehension Assessment Knowledge Level: Medium Comprehension Level: Medium Ability to understand written Medium instructions: Ability to understand verbal Medium instructions: Motivation Assessment Anxiety Level: Calm Cooperation: Cooperative Education Importance: Acknowledges Need Interest in Health Problems: Asks Questions Perception: Coherent Willingness to Engage in Self- Medium Management Activities: Readiness to Engage in Self- Medium Management Activities: Electronic Signature(s) Signed: 02/16/2018 4:40:08 PM By: Alric Quan Entered By: Alric Quan on 02/16/2018 12:44:48 [place], [name]  ([id number]) -------------------------------------------------------------------------------- Fall Risk Assessment Details Patient Name: [name]. Date of Service: 02/16/2018 12:30 PM Medical Record Number: [id number] Patient Account Number: [id number] Date of Birth/Sex: 1971/03/29 (47 y.o. Male) Treating RN: Ahmed Prima Primary Care Lametria Klunk: Einar Pheasant Other Clinician: Referring Leeba Barbe: Referral, Self Treating Izaha Shughart/Extender: Ricard Dillon Weeks in Treatment: 0 Fall Risk Assessment Items Have you had 2 or more falls in the last 12 monthso 0 No Have you had any fall that resulted in injury in the last 12 monthso 0 No FALL RISK ASSESSMENT: History of falling - immediate or within 3 months 0 No Secondary diagnosis 15 Yes Ambulatory aid None/bed rest/wheelchair/nurse 0 Yes Crutches/cane/walker 0 No  Furniture 0 No IV Access/Saline Lock 0 No Gait/Training Normal/bed rest/immobile 0 No Weak 0 No Impaired 0 No Mental Status Oriented to own ability 0 Yes Electronic Signature(s) Signed: 02/16/2018 4:40:08 PM By: Alric Quan Entered By: Alric Quan on 02/16/2018 12:45:01 [place], [name] ([id number]) -------------------------------------------------------------------------------- Foot Assessment Details Patient Name: [name]. Date of Service: 02/16/2018 12:30 PM Medical Record Number: [id number] Patient Account Number: [id number] Date of Birth/Sex: 10-25-1971 (47 y.o. Male) Treating RN: Carolyne Fiscal, Debi Primary Care Dorthia Tout: Einar Pheasant Other Clinician: Referring Alven Alverio: Referral, Self Treating Lerin Jech/Extender: Ricard Dillon Weeks in Treatment: 0 Foot Assessment Items Site Locations + = Sensation present, - = Sensation absent, C = Callus, U = Ulcer R = Redness, W = Warmth, M = Maceration, PU = Pre-ulcerative lesion F = Fissure, S = Swelling, D = Dryness Assessment Right: Left: Other Deformity: No No Prior Foot Ulcer: No  No Prior Amputation: No No Charcot Joint: No No Ambulatory Status: Gait: Notes pt states he can tell something is touching his foot but cant really feel it Electronic Signature(s) Signed: 02/16/2018 4:40:08 PM By: Alric Quan Entered By: Alric Quan on 02/16/2018 13:01:59 [place], [name] ([id number]) -------------------------------------------------------------------------------- Nutrition Risk Assessment Details Patient Name: [name]. Date of Service: 02/16/2018 12:30 PM Medical Record Number: [id number] Patient Account Number: [id number] Date of Birth/Sex: 05/23/1971 (47 y.o. Male) Treating RN: Ahmed Prima Primary Care Shatana Saxton: Einar Pheasant Other Clinician: Referring Freedom Lopezperez: Referral, Self Treating Yolunda Kloos/Extender: Ricard Dillon Weeks in Treatment: 0 Height (in): Weight (lbs): Body Mass Index (BMI): Nutrition Risk Assessment Items NUTRITION RISK SCREEN: I have an illness or condition that made me change the kind and/or amount of 0 No food I eat I eat fewer than two meals per day 0 No I eat few fruits and vegetables, or milk products 0 No I have three or more drinks of beer, liquor or wine almost every day 0 No I have tooth or mouth problems that make it hard for me to eat 0 No I don't always have enough money to buy the food I need 0 No I eat alone most of the time 0 No I take three or more different prescribed or over-the-counter drugs a day 0 No Without wanting to, I have lost or gained 10 pounds in the last six months 0 No I am not always physically able to shop, cook and/or feed myself 0 No Nutrition Protocols Good Risk Protocol Moderate Risk Protocol Electronic Signature(s) Signed: 02/16/2018 4:40:08 PM By: Alric Quan Entered By: Alric Quan on 02/16/2018 12:45:17

## 2018-02-18 NOTE — Progress Notes (Addendum)
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 Fat layer exposed. There was a Skin/Subcutaneous Tissue Debridement ([id number]) debridement with total area of 0.84 sq cm performed by Ricard Dillon, MD. with the following instrument(s): Curette to remove Viable and Non- Viable tissue/material including Subcutaneous after achieving pain control using Other (lidocaine 4%). A time out was conducted at 13:20, prior to the start of the procedure. A Moderate amount of bleeding was controlled with Pressure. The procedure was not tolerated well with a pain level of Insensate throughout and a pain level of Insensate following the procedure. Post Debridement Measurements: 0.7cm length x 1.2cm width x 0.7cm depth; 0.462cm^3 volume. Post debridement Stage noted as Category/Stage III. Character of Wound/Ulcer Post Debridement requires further debridement. Severity of Tissue Post Debridement is: Fat  layer exposed. Post procedure Diagnosis Wound #7: Same as Pre-Procedure Plan Wound Cleansing: Wound #7 Left Calcaneus: Cleanse wound with mild soap and water May Shower, gently pat wound dry prior to applying new dressing. Anesthetic (add to Medication List): Wound #7 Left Calcaneus: Topical Lidocaine 4% cream applied to wound bed prior to debridement (In Clinic Only). Primary Wound Dressing: Wound #7 Left Calcaneus: Silvercel Non-Adherent Secondary Dressing: Wound #7 Left Calcaneus: Boardered Foam Dressing Dressing Change Frequency: Wound #7 Left Calcaneus: Three times weekly Follow-up Appointments: Wound #7 Left Calcaneus: Return Appointment in 1 week. Laboratory ordered were: Wound culture routine - Left Heel Radiology ordered were: X-ray, heel - left The following medication(s) was prescribed: lidocaine topical 4 % cream cream topical was prescribed at facility doxycycline monohydrate oral 100 mg capsule 1 capsule oral bid for 7 days for wound infection starting 02/16/2018 Cohen, [place] K. ([id number]) #1 I have called this a pressure ulcer but I am really not certain about this. #2 concerned enough about infection for enteric antibiotics I'm going to give him doxycycline 100 twice a day for 7 days pending the culture I did post debridement #3 silver alginate border foam change daily #4 the patient states this happened he thinks while he is transferring in and out of the shower bench he has although I'm not really sure that this is a trauma lesion either. #5 this wound has some depth and I think is probably close to the Achilles tendon itself Electronic Signature(s) Signed: 02/16/2018 5:13:44 PM By: Gretta Cool, BSN, RN, CWS, Kim RN, BSN Signed: 02/16/2018 5:42:24 PM By: Linton Ham MD Previous Signature: 02/16/2018 2:37:04 PM Version By: Linton Ham MD Entered By: Gretta Cool, BSN, RN, CWS, Kim on 02/16/2018 17:13:44 Waddington, [name]  ([id number]) -------------------------------------------------------------------------------- ROS/PFSH Details Patient Name: AQUILLA, VOILES. Date of Service: 02/16/2018 12:30 PM Medical Record Number: [id number] Patient Account Number: [id number] Date of Birth/Sex: 1971-09-07 (47 y.o. M) Treating RN: Ahmed Prima Primary Care Provider: Einar Pheasant Other Clinician: Referring Provider: Einar Pheasant Treating Provider/Extender: Ricard Dillon Weeks in Treatment: 0  Information Obtained From Patient Wound History Do you currently have one or more open woundso Yes How many open wounds do you currently haveo 1 Approximately how long have you had your woundso 02/10/18 How have you been treating your wound(s) until nowo silver alginate Has your wound(s) ever healed and then re-openedo Yes Have you had any lab work done in the past montho No Have you tested positive for an antibiotic resistant organism (MRSA, VRE)o No Have you tested positive for osteomyelitis (bone infection)o No Have you had any tests for circulation on your legso No Constitutional Symptoms (General Health) Medical History: Past Medical History Notes: paraplegia chest down; kidney stones, bladder surgery; Type I Diabetic Eyes Medical History: Negative for: Cataracts; Glaucoma; Optic Neuritis Ear/Nose/Mouth/Throat Medical History: Negative for: Chronic sinus problems/congestion; Middle ear problems Hematologic/Lymphatic Medical History: Negative for: Anemia; Hemophilia; Human Immunodeficiency Virus; Lymphedema; Sickle Cell Disease Respiratory Medical History: Negative for: Aspiration; Asthma; Chronic Obstructive Pulmonary Disease (COPD); Pneumothorax; Sleep Apnea; Tuberculosis Cardiovascular Medical History: Negative for: Angina; Arrhythmia; Congestive Heart Failure; Coronary Artery Disease; Deep Vein Thrombosis; Hypertension; Hypotension; Myocardial Infarction; Peripheral Arterial Disease; Peripheral Venous  Disease; Phlebitis; Vasculitis Gastrointestinal Carpenito, [name]. ([id number]) Medical History: Negative for: Cirrhosis ; Colitis; Crohnos; Hepatitis A; Hepatitis B; Hepatitis C Endocrine Medical History: Positive for: Type I Diabetes Time with diabetes: 17 years Treated with: Insulin Blood sugar tested every day: Yes Tested : 6 times Blood sugar testing results: Breakfast: 143 Genitourinary Medical History: Negative for: End Stage Renal Disease Immunological Medical History: Negative for: Lupus Erythematosus; Raynaudos; Scleroderma Integumentary (Skin) Medical History: Positive for: History of pressure wounds Negative for: History of Burn Past Medical History Notes: paraplegia Musculoskeletal Medical History: Negative for: Gout; Rheumatoid Arthritis; Osteoarthritis; Osteomyelitis Neurologic Medical History: Positive for: Paraplegia - chest down Negative for: Dementia; Neuropathy; Quadriplegia; Seizure Disorder Oncologic Medical History: Negative for: Received Chemotherapy; Received Radiation Psychiatric Medical History: Negative for: Anorexia/bulimia; Confinement Anxiety Immunizations Pneumococcal Vaccine: Received Pneumococcal Vaccination: No Implantable Devices Scarber, [name] ([id number]) Family and Social History Cancer: Yes - Father; Diabetes: No; Heart Disease: No; Hypertension: Yes - Siblings,Paternal Grandparents,Father; Kidney Disease: No; Lung Disease: No; Seizures: No; Stroke: No; Thyroid Problems: No; Tuberculosis: No; Never smoker; Marital Status - Single; Alcohol Use: Daily - beer; Drug Use: No History; Caffeine Use: Moderate; Financial Concerns: No; Food, Clothing or Shelter Needs: No; Support System Lacking: No; Transportation Concerns: No; Advanced Directives: No; Patient does not want information on Advanced Directives; Living Will: Yes (Not Provided); Medical Power of Attorney: Yes (Not Provided) Electronic Signature(s) Signed: 02/16/2018 4:40:08  PM By: Alric Quan Signed: 02/16/2018 5:42:24 PM By: Linton Ham MD Entered By: Alric Quan on 02/16/2018 12:43:43 Hafer, [name] ([id number]) -------------------------------------------------------------------------------- SuperBill Details Patient Name: [name]. Date of Service: 02/16/2018 Medical Record Number: [id number] Patient Account Number: [id number] Date of Birth/Sex: 1971/07/15 (47 y.o. 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 Facility Procedures CPT4 Code: [id number] Description: [place] VISIT-LEV 3 EST PT Modifier: Quantity: 1 CPT4 Code: 23557322 Description: 02542 - DEB SUBQ TISSUE 20 SQ CM/< ICD-10 Diagnosis Description L89.623 Pressure ulcer of left heel, stage 3 E10.622 Type 1 diabetes mellitus with other skin ulcer Modifier: Quantity: 1 Physician Procedures CPT4 Code: 7062376  Description: 99214 - WC PHYS LEVEL 4 - EST PT ICD-10 Diagnosis Description L89.623 Pressure ulcer of left heel, stage 3 E10.622 Type 1 diabetes mellitus with other skin ulcer M50.822 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

## 2018-02-18 NOTE — Progress Notes (Signed)
[name], [name] ([id number]) Visit Report for 02/16/2018 Allergy List Details Patient Name: [name], [name]. Date of Service: 02/16/2018 12:30 PM Medical Record Number: [id number] Patient Account Number: [id number] Date of Birth/Sex: 1970/12/28 (47 y.o. Male) Treating RN: Ahmed Prima Primary Care Kmari Halter: Einar Pheasant Other Clinician: Referring Smith Potenza: Referral, Self Treating Guneet Delpino/Extender: Ricard Dillon Weeks in Treatment: 0 Allergies Active Allergies Sulfa (Sulfonamide Antibiotics) Reaction: tongue swells Iodinated Contrast Media - IV Dye decongestant Allergy Notes Electronic Signature(s) Signed: 02/16/2018 4:40:08 PM By: Alric Quan Entered By: Alric Quan on 02/16/2018 12:42:10 Veenstra, [name] ([id number]) -------------------------------------------------------------------------------- Arrival Information Details Patient Name: [name]. Date of Service: 02/16/2018 12:30 PM Medical Record Number: [id number] Patient Account Number: [id number] Date of Birth/Sex: 12/28/1970 (47 y.o. Male) Treating RN: Ahmed Prima Primary Care Dondre Catalfamo: Einar Pheasant Other Clinician: Referring Pammy Vesey: Referral, Self Treating Maximillion Gill/Extender: Tito Dine in Treatment: 0 Visit Information Patient Arrived: Wheel Chair Arrival Time: 12:39 Accompanied By: self Transfer Assistance: None Patient Identification Verified: Yes Secondary Verification Process Completed: Yes Patient Requires Transmission-Based No Precautions: Patient Has Alerts: Yes Patient Alerts: Type I Diabetic History Since Last Visit Electronic Signature(s) Signed: 02/16/2018 5:38:59 PM By: Gretta Cool, BSN, RN, CWS, Kim RN, BSN Entered By: Gretta Cool, BSN, RN, CWS, Kim on 02/16/2018 13:38:00 Hiemstra, [name] ([id number]) -------------------------------------------------------------------------------- Clinic Level of Care Assessment Details Patient Name: [name]. Date of  Service: 02/16/2018 12:30 PM Medical Record Number: [id number] Patient Account Number: [id number] Date of Birth/Sex: 1971/03/29 (47 y.o. Male) Treating RN: Cornell Barman Primary Care Tarron Krolak: Einar Pheasant Other Clinician: Referring Fenix Ruppe: Referral, Self Treating Avary Eichenberger/Extender: Ricard Dillon Weeks in Treatment: 0 Clinic Level of Care Assessment Items TOOL 1 Quantity Score []  - Use when EandM and Procedure is performed on INITIAL visit 0 ASSESSMENTS - Nursing Assessment / Reassessment X - General Physical Exam (combine w/ comprehensive assessment (listed just below) when 1 20 performed on new pt. evals) X- 1 25 Comprehensive Assessment (HX, ROS, Risk Assessments, Wounds Hx, etc.) ASSESSMENTS - Wound and Skin Assessment / Reassessment []  - Dermatologic / Skin Assessment (not related to wound area) 0 ASSESSMENTS - Ostomy and/or Continence Assessment and Care []  - Incontinence Assessment and Management 0 []  - 0 Ostomy Care Assessment and Management (repouching, etc.) PROCESS - Coordination of Care X - Simple Patient / Family Education for ongoing care 1 15 []  - 0 Complex (extensive) Patient / Family Education for ongoing care X- 1 10 Staff obtains Programmer, systems, Records, Test Results / Process Orders []  - 0 Staff telephones HHA, Nursing Homes / Clarify orders / etc []  - 0 Routine Transfer to another Facility (non-emergent condition) []  - 0 Routine Hospital Admission (non-emergent condition) X- 1 15 New Admissions / Biomedical engineer / Ordering NPWT, Apligraf, etc. []  - 0 Emergency Hospital Admission (emergent condition) PROCESS - Special Needs []  - Pediatric / Minor Patient Management 0 []  - 0 Isolation Patient Management []  - 0 Hearing / Language / Visual special needs []  - 0 Assessment of Community assistance (transportation, D/C planning, etc.) []  - 0 Additional assistance / Altered mentation []  - 0 Support Surface(s) Assessment (bed, cushion, seat,  etc.) Ohanesian, [name] ([id number]) INTERVENTIONS - Miscellaneous []  - External ear exam 0 []  - 0 Patient Transfer (multiple staff / Civil Service fast streamer / Similar devices) []  - 0 Simple Staple / Suture removal (25 or less) []  - 0 Complex Staple / Suture removal (26 or more) []  - 0 Hypo/Hyperglycemic Management (do not check if billed separately) X- 1 15  Ankle / Brachial Index (ABI) - do not check if billed separately Has the patient been seen at the hospital within the last three years: Yes Total Score: 100 Level Of Care: New/Established - Level 3 Electronic Signature(s) Signed: 02/16/2018 5:38:59 PM By: Gretta Cool, BSN, RN, CWS, Kim RN, BSN Entered By: Gretta Cool, BSN, RN, CWS, Kim on 02/16/2018 13:38:41 Chowning, [name] ([id number]) -------------------------------------------------------------------------------- Encounter Discharge Information Details Patient Name: [name]. Date of Service: 02/16/2018 12:30 PM Medical Record Number: [id number] Patient Account Number: [id number] Date of Birth/Sex: July 30, 1971 (47 y.o. Male) Treating RN: Montey Hora Primary Care Guillermo Nehring: Einar Pheasant Other Clinician: Referring Lilburn Straw: Referral, Self Treating Peg Fifer/Extender: Tito Dine in Treatment: 0 Encounter Discharge Information Items Discharge Pain Level: 0 Discharge Condition: Stable Ambulatory Status: Wheelchair Discharge Destination: Home Private Transportation: Auto Accompanied By: self Schedule Follow-up Appointment: Yes Medication Reconciliation completed and provided No to Patient/Care Chrisoula Zegarra: Clinical Summary of Care: Electronic Signature(s) Signed: 02/16/2018 2:07:59 PM By: Montey Hora Entered By: Montey Hora on 02/16/2018 14:07:59 Rozar, [name] ([id number]) -------------------------------------------------------------------------------- Lower Extremity Assessment Details Patient Name: [name]. Date of Service: 02/16/2018 12:30 PM Medical  Record Number: [id number] Patient Account Number: [id number] Date of Birth/Sex: 08-Apr-1971 (47 y.o. Male) Treating RN: Ahmed Prima Primary Care Duong Haydel: Einar Pheasant Other Clinician: Referring Jacson Rapaport: Referral, Self Treating Aidynn Krenn/Extender: Ricard Dillon Weeks in Treatment: 0 Vascular Assessment Pulses: Dorsalis Pedis Palpable: [Left:Yes] Doppler Audible: [Left:Yes] Posterior Tibial Palpable: [Left:Yes] Doppler Audible: [Left:Yes] Extremity colors, hair growth, and conditions: Extremity Color: [Left:Normal] Temperature of Extremity: [Left:Cold] Capillary Refill: [Left:> 3 seconds] Blood Pressure: Brachial: [Left:160] Dorsalis Pedis: 160 [Left:Dorsalis Pedis:] Ankle: Posterior Tibial: 178 [Left:Posterior Tibial: 1.11] Toe Nail Assessment Left: Right: Thick: No Discolored: No Deformed: No Improper Length and Hygiene: No Electronic Signature(s) Signed: 02/16/2018 4:40:08 PM By: Alric Quan Entered By: Alric Quan on 02/16/2018 12:58:51 Risko, [name] ([id number]) -------------------------------------------------------------------------------- Multi Wound Chart Details Patient Name: [name]. Date of Service: 02/16/2018 12:30 PM Medical Record Number: [id number] Patient Account Number: [id number] Date of Birth/Sex: 05/08/1971 (47 y.o. 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] [N/A:N/A] Comorbid  History: [7:Type I Diabetes, History of pressure wounds, Paraplegia] [N/A:N/A] Date Acquired: [7:02/10/2018] [N/A:N/A] Weeks of Treatment: [7:0] [N/A:N/A] Wound Status: [7:Open] [N/A:N/A] Measurements L x W x D [7:0.7x1.2x0.2] [N/A:N/A] (cm) Area (cm) : [7:0.66] [N/A:N/A] Volume (cm) : [7:0.132] [N/A:N/A] Classification: [7:Category/Stage III] [N/A:N/A] Exudate Amount: [7:Medium] [N/A:N/A] Exudate Type: [7:Serosanguineous] [N/A:N/A] Exudate Color: [7:red, brown] [N/A:N/A] Wound Margin: [7:Distinct, outline attached] [N/A:N/A] Granulation Amount: [7:Small (1-33%)] [N/A:N/A] Granulation Quality: [7:Red] [N/A:N/A] Necrotic Amount: [7:Large (67-100%)] [N/A:N/A] Necrotic Tissue: [7:Eschar, Adherent Slough] [N/A:N/A] Exposed Structures: [7:Fascia: No Fat Layer (Subcutaneous Tissue) Exposed: No Tendon: No Muscle: No Joint: No Bone: No] [N/A:N/A] Epithelialization: [7:None] [N/A:N/A] Debridement: [7:Debridement ([id number])] [N/A:N/A] Pre-procedure [7:13:20] [N/A:N/A] Verification/Time Out Taken: Pain Control: [7:Other] [N/A:N/A] Tissue Debrided: [7:Subcutaneous] [N/A:N/A] Level: Skin/Subcutaneous Tissue N/A N/A Debridement Area (sq cm): 0.84 N/A N/A Instrument: Curette N/A N/A Bleeding: Moderate N/A N/A Hemostasis Achieved: Pressure N/A N/A Procedural Pain: Insensate N/A N/A Post Procedural Pain: Insensate N/A N/A Debridement Treatment Procedure was not tolerated N/A N/A Response: well Post Debridement 0.7x1.2x0.7 N/A  N/A Measurements L x W x D (cm) Post Debridement Volume: 0.462 N/A N/A (cm) Post Debridement Stage: Category/Stage III N/A N/A Periwound Skin Texture: No Abnormalities Noted N/A N/A Periwound Skin Moisture: No Abnormalities Noted N/A N/A Periwound Skin Color: No Abnormalities Noted N/A N/A Temperature: No Abnormality N/A N/A Tenderness on Palpation: No N/A N/A Wound Preparation: Ulcer Cleansing: N/A N/A Rinsed/Irrigated with Saline Topical Anesthetic  Applied: Other: lidocaine 4% Procedures Performed: Debridement N/A N/A Treatment Notes Wound #7 (Left Calcaneus) 1. Cleansed with: Clean wound with Normal Saline 2. Anesthetic Topical Lidocaine 4% cream to wound bed prior to debridement 4. Dressing Applied: Other dressing (specify in notes) 5. Secondary Dressing Applied Bordered Foam Dressing Dry Gauze Notes silvercel Electronic Signature(s) Signed: 02/16/2018 5:42:24 PM By: Linton Ham MD Entered By: Linton Ham on 02/16/2018 14:20:14 Veale, [name] ([id number]) -------------------------------------------------------------------------------- Sparks Details Patient Name: [name]. Date of Service: 02/16/2018 12:30 PM Medical Record Number: [id number] Patient Account Number: [id number] Date of Birth/Sex: December 28, 1970 (47 y.o. Male) Treating RN: Cornell Barman Primary Care Genette Huertas: Einar Pheasant Other Clinician: Referring Coen Miyasato: Referral, Self Treating Mercia Dowe/Extender: Tito Dine in Treatment: 0 Active Inactive ` Orientation to the Wound Care Program Nursing Diagnoses: Knowledge deficit related to the wound healing center program Goals: Patient/caregiver will verbalize understanding of the [facility] Program Date Initiated: 02/16/2018 Target Resolution Date: 03/19/2017 Goal Status: Active Interventions: Provide education on orientation to the wound center Notes: ` Pressure Nursing Diagnoses: Knowledge deficit related to management of pressures ulcers Goals: Patient/caregiver will verbalize risk factors for pressure ulcer development Date Initiated: 02/16/2018 Target Resolution Date: 03/19/2018 Goal Status: Active Interventions: Assess: immobility, friction, shearing, incontinence upon admission and as needed Notes: ` Soft Tissue Infection Nursing Diagnoses: Impaired tissue integrity Potential for infection: soft tissue Goals: Patient will remain free of  wound infection Date Initiated: 02/16/2018 Target Resolution Date: 03/19/2018 Goal Status: Active DARROL, BRANDENBURG ([id number]) Interventions: Assess signs and symptoms of infection every visit Notes: ` Wound/Skin Impairment Nursing Diagnoses: Impaired tissue integrity Goals: Ulcer/skin breakdown will heal within 14 weeks Date Initiated: 02/16/2018 Target Resolution Date: 06/18/2018 Goal Status: Active Interventions: Assess ulceration(s) every visit Treatment Activities: Topical wound management initiated : 02/16/2018 Notes: Electronic Signature(s) Signed: 02/16/2018 5:38:59 PM By: Gretta Cool, BSN, RN, CWS, Kim RN, BSN Entered By: Gretta Cool, BSN, RN, CWS, Kim on 02/16/2018 13:24:41 Asher, [name] ([id number]) -------------------------------------------------------------------------------- Pain Assessment Details Patient Name: [name]. Date of Service: 02/16/2018 12:30 PM Medical Record Number: [id number] Patient Account Number: [id number] Date of Birth/Sex: [birth year] (47 y.o. Male) Treating RN: Ahmed Prima Primary Care Zaven Klemens: Einar Pheasant Other Clinician: Referring Tima Curet: Referral, Self Treating Loris Seelye/Extender: Ricard Dillon Weeks in Treatment: 0 Active Problems Location of Pain Severity and Description of Pain Patient Has Paino No Site Locations Pain Management and Medication Current Pain Management: Electronic Signature(s) Signed: 02/16/2018 4:40:08 PM By: Alric Quan Entered By: Alric Quan on 02/16/2018 12:40:23 Matthis, [name] ([id number]) -------------------------------------------------------------------------------- Patient/Caregiver Education Details Patient Name: [name]. Date of Service: 02/16/2018 12:30 PM Medical Record Number: [id number] Patient Account Number: [id number] Date of Birth/Gender: 09-21-1971 (47 y.o. Male) Treating RN: Montey Hora Primary Care Physician: Einar Pheasant Other Clinician: Referring Physician:  Referral, Self Treating Physician/Extender: Tito Dine in Treatment: 0 Education Assessment Education Provided To: Patient Education Topics Provided Wound/Skin Impairment: Handouts: Other: wound care as ordered Methods: Demonstration, Explain/Verbal Responses: State content correctly Electronic Signature(s) Signed: 02/16/2018 5:19:26 PM By: Montey Hora Entered By: Montey Hora on 02/16/2018  14:08:17 ERINN, HUSKINS ([id number]) -------------------------------------------------------------------------------- Wound Assessment Details Patient Name: ELCHANAN, BOB. Date of Service: 02/16/2018 12:30 PM Medical Record Number: [id number] Patient Account Number: [id number] Date of Birth/Sex: 1971/07/07 (47 y.o. Male) Treating RN: Ahmed Prima Primary Care Shontia Gillooly: Einar Pheasant Other Clinician: Referring Salbador Fiveash: Referral, Self Treating Kristian Mogg/Extender: Ricard Dillon Weeks in Treatment: 0 Wound Status Wound Number: 7 Primary Etiology: Pressure Ulcer Wound Location: Left Calcaneus Secondary Diabetic Wound/Ulcer of the Lower Etiology: Extremity Wounding Event: Pressure Injury Wound Status: Open Date Acquired: 02/10/2018 Comorbid Type I Diabetes, History of pressure Weeks Of Treatment: 0 History: wounds, Paraplegia Clustered Wound: No Photos Photo Uploaded By: Alric Quan on 02/16/2018 17:19:25 Wound Measurements Length: (cm) 0.7 Width: (cm) 1.2 Depth: (cm) 0.2 Area: (cm) 0.66 Volume: (cm) 0.132 % Reduction in Area: % Reduction in Volume: Epithelialization: None Tunneling: No Undermining: No Wound Description Classification: Category/Stage III Foul Wound Margin: Distinct, outline attached Slou Exudate Amount: Medium Exudate Type: Serosanguineous Exudate Color: red, brown Odor After Cleansing: No gh/Fibrino Yes Wound Bed Granulation Amount: Small (1-33%) Exposed Structure Granulation Quality: Red Fascia Exposed: No Necrotic  Amount: Large (67-100%) Fat Layer (Subcutaneous Tissue) Exposed: No Necrotic Quality: Eschar, Adherent Slough Tendon Exposed: No Muscle Exposed: No Joint Exposed: No Bone Exposed: No Periwound Skin Texture Collantes, [name] ([id number]) Texture Color No Abnormalities Noted: No No Abnormalities Noted: No Moisture Temperature / Pain No Abnormalities Noted: No Temperature: No Abnormality Wound Preparation Ulcer Cleansing: Rinsed/Irrigated with Saline Topical Anesthetic Applied: Other: lidocaine 4%, Treatment Notes Wound #7 (Left Calcaneus) 1. Cleansed with: Clean wound with Normal Saline 2. Anesthetic Topical Lidocaine 4% cream to wound bed prior to debridement 4. Dressing Applied: Other dressing (specify in notes) 5. Secondary Dressing Applied Bordered Foam Dressing Dry Gauze Notes silvercel Electronic Signature(s) Signed: 02/16/2018 4:40:08 PM By: Alric Quan Entered By: Alric Quan on 02/16/2018 13:00:27 Rasnick, [name] ([id number]) -------------------------------------------------------------------------------- Vitals Details Patient Name: [name]. Date of Service: 02/16/2018 12:30 PM Medical Record Number: [id number] Patient Account Number: [id number] Date of Birth/Sex: 10-11-1971 (47 y.o. Male) Treating RN: Ahmed Prima Primary Care Anjolina Byrer: Einar Pheasant Other Clinician: Referring Lennin Osmond: Referral, Self Treating Kieron Kantner/Extender: Ricard Dillon Weeks in Treatment: 0 Vital Signs Time Taken: 12:40 Temperature (F): 97.6 Pulse (bpm): 58 Respiratory Rate (breaths/min): 18 Blood Pressure (mmHg): 145/75 Reference Range: 80 - 120 mg / dl Electronic Signature(s) Signed: 02/16/2018 4:40:08 PM By: Alric Quan Entered By: Alric Quan on 02/16/2018 12:41:32

## 2018-02-19 LAB — AEROBIC CULTURE W GRAM STAIN (SUPERFICIAL SPECIMEN)

## 2018-02-23 ENCOUNTER — Encounter: Payer: Medicare Other | Admitting: Nurse Practitioner

## 2018-02-23 DIAGNOSIS — L89623 Pressure ulcer of left heel, stage 3: Secondary | ICD-10-CM | POA: Diagnosis not present

## 2018-02-23 DIAGNOSIS — Z888 Allergy status to other drugs, medicaments and biological substances status: Secondary | ICD-10-CM | POA: Diagnosis not present

## 2018-02-23 DIAGNOSIS — Z87442 Personal history of urinary calculi: Secondary | ICD-10-CM | POA: Diagnosis not present

## 2018-02-23 DIAGNOSIS — Z794 Long term (current) use of insulin: Secondary | ICD-10-CM | POA: Diagnosis not present

## 2018-02-23 DIAGNOSIS — G8929 Other chronic pain: Secondary | ICD-10-CM | POA: Diagnosis not present

## 2018-02-23 DIAGNOSIS — Z91041 Radiographic dye allergy status: Secondary | ICD-10-CM | POA: Diagnosis not present

## 2018-02-23 DIAGNOSIS — Z8249 Family history of ischemic heart disease and other diseases of the circulatory system: Secondary | ICD-10-CM | POA: Diagnosis not present

## 2018-02-23 DIAGNOSIS — I10 Essential (primary) hypertension: Secondary | ICD-10-CM | POA: Diagnosis not present

## 2018-02-23 DIAGNOSIS — G8254 Quadriplegia, C5-C7 incomplete: Secondary | ICD-10-CM | POA: Diagnosis not present

## 2018-02-23 DIAGNOSIS — Z882 Allergy status to sulfonamides status: Secondary | ICD-10-CM | POA: Diagnosis not present

## 2018-02-23 DIAGNOSIS — Z981 Arthrodesis status: Secondary | ICD-10-CM | POA: Diagnosis not present

## 2018-02-23 DIAGNOSIS — E10621 Type 1 diabetes mellitus with foot ulcer: Secondary | ICD-10-CM | POA: Diagnosis not present

## 2018-02-23 NOTE — Progress Notes (Addendum)
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 Yes (Not Provided) Physician Affirmation I have reviewed and agree with the above information. Electronic Signature(s) Signed: 02/23/2018 5:31:19 PM By: Lawanda Cousins Signed: 02/25/2018 4:43:23 PM By: Gretta Cool, BSN, RN, CWS, Kim RN, BSN Entered By: Lawanda Cousins on 02/23/2018 15:21:40 [place], [name] ([id number]) -------------------------------------------------------------------------------- SuperBill Details Patient Name: [name]. Date of Service: 02/23/2018 Medical Record Number: [id number] Patient Account Number: [id number] Date of Birth/Sex: 1971/04/04 (47 y.o. 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

## 2018-02-25 NOTE — Progress Notes (Signed)
KYON, BENTLER ([id number]) Visit Report for 02/23/2018 Arrival Information Details Patient Name: [name], [name]. Date of Service: 02/23/2018 2:30 PM Medical Record Number: [id number] Patient Account Number: [id number] Date of Birth/Sex: 14-Jun-1971 (47 y.o. Male) Treating RN: Montey Hora Primary Care Shatavia Santor: Einar Pheasant Other Clinician: Referring Aevah Stansbery: Einar Pheasant Treating Nestor Wieneke/Extender: Cathie Olden in Treatment: 1 Visit Information History Since Last Visit Added or deleted any medications: No Patient Arrived: Wheel Chair Any new allergies or adverse reactions: No Arrival Time: 14:52 Had a fall or experienced change in No Accompanied By: self activities of daily living that may affect Transfer Assistance: None risk of falls: Patient Identification Verified: Yes Signs or symptoms of abuse/neglect since last visito No Secondary Verification Process Completed: Yes Hospitalized since last visit: No Patient Requires Transmission-Based No Has Dressing in Place as Prescribed: Yes Precautions: Pain Present Now: No Patient Has Alerts: Yes Patient Alerts: Type I Diabetic Electronic Signature(s) Signed: 02/23/2018 5:43:37 PM By: Montey Hora Entered By: Montey Hora on 02/23/2018 14:53:06 [place], [name] ([id number]) -------------------------------------------------------------------------------- Encounter Discharge Information Details Patient Name: [name]. Date of Service: 02/23/2018 2:30 PM Medical Record Number: [id number] Patient Account Number: [id number] Date of Birth/Sex: 03/26/1971 (47 y.o. Male) Treating RN: Cornell Barman Primary Care Kamariah Fruchter: Einar Pheasant Other Clinician: Referring Jermal Dismuke: Einar Pheasant Treating Sage Kopera/Extender: Cathie Olden in Treatment: 1 Encounter Discharge Information Items Discharge Pain Level: 0 Discharge Condition: Stable Ambulatory Status: Wheelchair Discharge Destination:  Home Transportation: Private Auto Accompanied By: self Schedule Follow-up Appointment: Yes Medication Reconciliation completed and No provided to Patient/Care Deanndra Kirley: Patient Clinical Summary of Care: Declined Electronic Signature(s) Signed: 02/23/2018 4:46:17 PM By: Montey Hora Entered By: Montey Hora on 02/23/2018 16:46:17 [place], [name] ([id number]) -------------------------------------------------------------------------------- Lower Extremity Assessment Details Patient Name: [name]. Date of Service: 02/23/2018 2:30 PM Medical Record Number: [id number] Patient Account Number: [id number] Date of Birth/Sex: [birth year] (47 y.o. Male) Treating RN: Montey Hora Primary Care Martin Smeal: Einar Pheasant Other Clinician: Referring Jacquise Rarick: Einar Pheasant Treating Khaidyn Staebell/Extender: Cathie Olden in Treatment: 1 Vascular Assessment Pulses: Dorsalis Pedis Palpable: [Left:Yes] Posterior Tibial Extremity colors, hair growth, and conditions: Extremity Color: [Left:Normal] Hair Growth on Extremity: [Left:Yes] Temperature of Extremity: [Left:Warm] Capillary Refill: [Left:< 3 seconds] Electronic Signature(s) Signed: 02/23/2018 4:45:08 PM By: Montey Hora Entered By: Montey Hora on 02/23/2018 16:45:08 [place], [name] ([id number]) -------------------------------------------------------------------------------- Multi Wound Chart Details Patient Name: [name]. Date of Service: 02/23/2018 2:30 PM Medical Record Number: [id number] Patient Account Number: [id number] Date of Birth/Sex: 30-Mar-1971 (47 y.o. 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] [N/A:N/A] Comorbid History: [7:Type I Diabetes, History of pressure wounds, Paraplegia] [N/A:N/A] Date Acquired: [7:02/10/2018] [N/A:N/A] Weeks of Treatment: [7:1] [N/A:N/A] Wound Status: [7:Open] [N/A:N/A] Measurements L x W x D [7:1x1.5x0.2] [N/A:N/A] (cm) Area (cm) : [7:1.178] [N/A:N/A] Volume (cm) : [7:0.236] [N/A:N/A] % Reduction in Area: [7:-78.50%] [N/A:N/A] % Reduction in Volume: [7:-78.80%] [N/A:N/A] Classification: [7:Category/Stage III] [N/A:N/A] Exudate Amount: [7:Small] [N/A:N/A] Exudate Type: [7:Serosanguineous] [N/A:N/A] Exudate Color: [7:red, brown] [N/A:N/A] Wound Margin: [7:Distinct, outline attached] [N/A:N/A] Granulation Amount: [7:Small (1-33%)] [N/A:N/A] Granulation Quality: [7:Red] [N/A:N/A] Necrotic Amount: [7:Large (67-100%)] [N/A:N/A] Exposed Structures: [7:Fascia: No Fat Layer (  Subcutaneous Tissue) Exposed: No Tendon: No Muscle: No Joint: No Bone: No] [N/A:N/A] Epithelialization: [7:None] [N/A:N/A] Debridement: [7:Debridement ([id number])] [N/A:N/A] Pre-procedure [7:03:06] [N/A:N/A] Verification/Time Out Taken: Pain Control: [7:Lidocaine 2% Topical Gel] [N/A:N/A] Tissue Debrided: Fibrin/Slough, Subcutaneous N/A N/A Level: Skin/Subcutaneous Tissue N/A N/A Debridement Area (sq cm): 1.5 N/A N/A Instrument: Curette N/A N/A Bleeding: Minimum N/A N/A Hemostasis Achieved: Pressure N/A N/A Procedural Pain: 0 N/A N/A Post Procedural Pain: 0 N/A N/A Debridement Treatment Procedure was tolerated well N/A N/A Response: Post Debridement 1x1.5x0.3 N/A N/A Measurements L x W x D (cm) Post Debridement Volume: 0.353 N/A N/A (cm) Post Debridement Stage: Category/Stage III N/A N/A Periwound Skin  Texture: Excoriation: No N/A N/A Induration: No Callus: No Crepitus: No Rash: No Scarring: No Periwound Skin Moisture: No Abnormalities Noted N/A N/A Periwound Skin Color: Atrophie Blanche: No N/A N/A Cyanosis: No Ecchymosis: No Erythema: No Hemosiderin Staining: No Mottled: No Pallor: No Rubor: No Temperature: No Abnormality N/A N/A Tenderness on Palpation: No N/A N/A Wound Preparation: Ulcer Cleansing: N/A N/A Rinsed/Irrigated with Saline Topical Anesthetic Applied: Other: lidocaine 4% Procedures Performed: Debridement N/A N/A Treatment Notes Electronic Signature(s) Signed: 02/23/2018 3:19:07 PM By: Lawanda Cousins Entered By: Lawanda Cousins on 02/23/2018 15:19:06 [place]isen[place], [name] ([id number]) -------------------------------------------------------------------------------- [place] Details Patient Name: [name]. Date of Service: 02/23/2018 2:30 PM Medical Record Number: [id number] Patient Account Number: [id number] Date of Birth/Sex: March 04, 1971 (47 y.o. Male) Treating RN: Montey Hora Primary Care Takeyah Wieman: Einar Pheasant Other Clinician: Referring Brix Brearley: Einar Pheasant Treating Saron Vanorman/Extender: Cathie Olden in Treatment: 1 Active Inactive ` Orientation to the Wound Care Program Nursing Diagnoses: Knowledge deficit related to the wound healing center program Goals: Patient/caregiver will verbalize understanding of the [facility] Date Initiated: 02/16/2018 Target Resolution Date: 03/19/2017 Goal Status: Active Interventions: Provide education on orientation to the wound center Notes: ` Pressure Nursing Diagnoses: Knowledge deficit related to management of pressures ulcers Goals: Patient/caregiver will verbalize risk factors for pressure ulcer development Date Initiated: 02/16/2018 Target Resolution Date: 03/19/2018 Goal Status: Active Interventions: Assess: immobility, friction, shearing, incontinence  upon admission and as needed Notes: ` Soft Tissue Infection Nursing Diagnoses: Impaired tissue integrity Potential for infection: soft tissue Goals: Patient will remain free of wound infection Date Initiated: 02/16/2018 Target Resolution Date: 03/19/2018 Goal Status: Active EKAM, BESSON ([id number]) Interventions: Assess signs and symptoms of infection every visit Notes: ` Wound/Skin Impairment Nursing Diagnoses: Impaired tissue integrity Goals: Ulcer/skin breakdown will heal within 14 weeks Date Initiated: 02/16/2018 Target Resolution Date: 06/18/2018 Goal Status: Active Interventions: Assess ulceration(s) every visit Treatment Activities: Topical wound management initiated : 02/16/2018 Notes: Electronic Signature(s) Signed: 02/23/2018 5:43:37 PM By: Montey Hora Entered By: Montey Hora on 02/23/2018 15:11:11 [place], [name] ([id number]) -------------------------------------------------------------------------------- Pain Assessment Details Patient Name: [name]. Date of Service: 02/23/2018 2:30 PM Medical Record Number: [id number] Patient Account Number: [id number] Date of Birth/Sex: [birth year] (47 y.o. Male) Treating RN: Montey Hora Primary Care Mirabella Hilario: Einar Pheasant Other Clinician: Referring Jovie Swanner: Einar Pheasant Treating Peola Joynt/Extender: Cathie Olden in Treatment: 1 Active Problems Location of Pain Severity and Description of Pain Patient Has Paino No Site Locations Pain Management and Medication Current Pain Management: Electronic Signature(s) Signed: 02/23/2018 5:43:37 PM By: Montey Hora Entered By: Montey Hora on 02/23/2018 14:53:14 [place], [name] ([id number]) -------------------------------------------------------------------------------- Patient/Caregiver Education Details Patient Name: [name]. Date of Service: 02/23/2018 2:30 PM Medical Record Number: [id number] Patient Account Number: [id number] Date of  Birth/Gender: 03-May-1971 (47 y.o. Male) Treating RN: Montey Hora Primary  Care Physician: Einar Pheasant Other Clinician: Referring Physician: Einar Pheasant Treating Physician/Extender: Cathie Olden in Treatment: 1 Education Assessment Education Provided To: Patient Education Topics Provided Wound/Skin Impairment: Handouts: Other: wound care as ordered Methods: Explain/Verbal Responses: State content correctly Electronic Signature(s) Signed: 02/23/2018 5:43:37 PM By: Montey Hora Entered By: Montey Hora on 02/23/2018 16:47:13 C[place], [name] ([id number]) -------------------------------------------------------------------------------- Wound Assessment Details Patient Name: [name]. Date of Service: 02/23/2018 2:30 PM Medical Record Number: [id number] Patient Account Number: [id number] Date of Birth/Sex: 11-03-1971 (47 y.o. Male) Treating RN: Montey Hora Primary Care Elon Eoff: Einar Pheasant Other Clinician: Referring Laquinn Shippy: Einar Pheasant Treating Masako Overall/Extender: Cathie Olden in Treatment: 1 Wound Status Wound Number: 7 Primary Etiology: Pressure Ulcer Wound Location: Left Calcaneus Secondary Diabetic Wound/Ulcer of the Lower Etiology: Extremity Wounding Event: Pressure Injury Wound Status: Open Date Acquired: 02/10/2018 Comorbid Type I Diabetes, History of pressure Weeks Of Treatment: 1 History: wounds, Paraplegia Clustered Wound: No Photos Photo Uploaded By: Montey Hora on 02/23/2018 16:21:33 Wound Measurements Length: (cm) 1 Width: (cm) 1.5 Depth: (cm) 0.2 Area: (cm) 1.178 Volume: (cm) 0.236 % Reduction in Area: -78.5% % Reduction in Volume: -78.8% Epithelialization: None Tunneling: No Undermining: No Wound Description Classification: Category/Stage III Foul O Wound Margin: Distinct, outline attached Slough Exudate Amount: Small Exudate Type: Serosanguineous Exudate Color: red, brown dor After Cleansing:  No /Fibrino Yes Wound Bed Granulation Amount: Small (1-33%) Exposed Structure Granulation Quality: Red Fascia Exposed: No Necrotic Amount: Large (67-100%) Fat Layer (Subcutaneous Tissue) Exposed: No Necrotic Quality: Adherent Slough Tendon Exposed: No Muscle Exposed: No Joint Exposed: No Bone Exposed: No Periwound Skin Texture Priego, [name] ([id number]) Texture Color No Abnormalities Noted: No No Abnormalities Noted: No Callus: No Atrophie Blanche: No Crepitus: No Cyanosis: No Excoriation: No Ecchymosis: No Induration: No Erythema: No Rash: No Hemosiderin Staining: No Scarring: No Mottled: No Pallor: No Moisture Rubor: No No Abnormalities Noted: No Temperature / Pain Temperature: No Abnormality Wound Preparation Ulcer Cleansing: Rinsed/Irrigated with Saline Topical Anesthetic Applied: Other: lidocaine 4%, Treatment Notes Wound #7 (Left Calcaneus) 1. Cleansed with: Clean wound with Normal Saline 2. Anesthetic Topical Lidocaine 4% cream to wound bed prior to debridement 4. Dressing Applied: Prisma Ag 5. Secondary Dressing Applied Bordered Foam Dressing Electronic Signature(s) Signed: 02/23/2018 5:43:37 PM By: Montey Hora Entered By: Montey Hora on 02/23/2018 15:05:18 [place], [name] ([id number]) -------------------------------------------------------------------------------- Vitals Details Patient Name: [name]. Date of Service: 02/23/2018 2:30 PM Medical Record Number: [id number] Patient Account Number: [id number] Date of Birth/Sex: 1971/10/07 (47 y.o. Male) Treating RN: Montey Hora Primary Care Pyper Olexa: Einar Pheasant Other Clinician: Referring Neilan Rizzo: Einar Pheasant Treating Karita Dralle/Extender: Cathie Olden in Treatment: 1 Vital Signs Time Taken: 14:53 Temperature (F): 98.3 Pulse (bpm): 59 Respiratory Rate (breaths/min): 18 Blood Pressure (mmHg): 109/74 Reference Range: 80 - 120 mg / dl Electronic  Signature(s) Signed: 02/23/2018 5:43:37 PM By: Montey Hora Entered By: Montey Hora on 02/23/2018 14:54:51

## 2018-03-09 ENCOUNTER — Encounter: Payer: Medicare Other | Attending: Internal Medicine | Admitting: Internal Medicine

## 2018-03-09 DIAGNOSIS — L97422 Non-pressure chronic ulcer of left heel and midfoot with fat layer exposed: Secondary | ICD-10-CM | POA: Diagnosis not present

## 2018-03-09 DIAGNOSIS — L89623 Pressure ulcer of left heel, stage 3: Secondary | ICD-10-CM | POA: Insufficient documentation

## 2018-03-09 DIAGNOSIS — M50822 Other cervical disc disorders at C5-C6 level: Secondary | ICD-10-CM | POA: Insufficient documentation

## 2018-03-09 DIAGNOSIS — G8250 Quadriplegia, unspecified: Secondary | ICD-10-CM | POA: Insufficient documentation

## 2018-03-09 DIAGNOSIS — E109 Type 1 diabetes mellitus without complications: Secondary | ICD-10-CM | POA: Diagnosis not present

## 2018-03-11 ENCOUNTER — Other Ambulatory Visit: Payer: Self-pay | Admitting: Internal Medicine

## 2018-03-11 ENCOUNTER — Encounter: Payer: Self-pay | Admitting: Internal Medicine
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
## 2018-03-14 NOTE — Progress Notes (Signed)
ISOM, KOCHAN ([id number]) Visit Report for 03/09/2018 Arrival Information Details Patient Name: [name], [name]. Date of Service: 03/09/2018 3:30 PM Medical Record Number: [id number] Patient Account Number: [id number] Date of Birth/Sex: 1971-05-22 (47 y.o. M) Treating RN: Montey Hora Primary Care Billye Nydam: Einar Pheasant Other Clinician: Referring Jaslene Marsteller: Einar Pheasant Treating Colleen Kotlarz/Extender: Tito Dine in Treatment: 3 Visit Information History Since Last Visit Added or deleted any medications: No Patient Arrived: Wheel Chair Any new allergies or adverse reactions: No Arrival Time: 15:35 Had a fall or experienced change in No Accompanied By: self activities of daily living that may affect Transfer Assistance: None risk of falls: Patient Identification Verified: Yes Signs or symptoms of abuse/neglect since last visito No Secondary Verification Process Completed: Yes Hospitalized since last visit: No Patient Requires Transmission-Based No Implantable device outside of the clinic excluding No Precautions: cellular tissue based products placed in the center Patient Has Alerts: Yes since last visit: Patient Alerts: Type I Has Dressing in Place as Prescribed: Yes Diabetic Pain Present Now: No Electronic Signature(s) Signed: 03/09/2018 4:21:37 PM By: Montey Hora Entered By: Montey Hora on 03/09/2018 15:36:24 Cisse, [name] ([id number]) -------------------------------------------------------------------------------- Clinic Level of Care Assessment Details Patient Name: [name]. Date of Service: 03/09/2018 3:30 PM Medical Record Number: [id number] Patient Account Number: [id number] Date of Birth/Sex: 08-06-1971 (47 y.o. M) Treating RN: Cornell Barman Primary Care Dayshia Ballinas: Einar Pheasant Other Clinician: Referring Harlyn Rathmann: Einar Pheasant Treating Ravina Milner/Extender: Tito Dine in Treatment: 3 Clinic Level of Care Assessment  Items TOOL 4 Quantity Score []  - Use when only an EandM is performed on FOLLOW-UP visit 0 ASSESSMENTS - Nursing Assessment / Reassessment []  - Reassessment of Co-morbidities (includes updates in patient status) 0 X- 1 5 Reassessment of Adherence to Treatment Plan ASSESSMENTS - Wound and Skin Assessment / Reassessment X - Simple Wound Assessment / Reassessment - one wound 1 5 []  - 0 Complex Wound Assessment / Reassessment - multiple wounds []  - 0 Dermatologic / Skin Assessment (not related to wound area) ASSESSMENTS - Focused Assessment []  - Circumferential Edema Measurements - multi extremities 0 []  - 0 Nutritional Assessment / Counseling / Intervention []  - 0 Lower Extremity Assessment (monofilament, tuning fork, pulses) []  - 0 Peripheral Arterial Disease Assessment (using hand held doppler) ASSESSMENTS - Ostomy and/or Continence Assessment and Care []  - Incontinence Assessment and Management 0 []  - 0 Ostomy Care Assessment and Management (repouching, etc.) PROCESS - Coordination of Care X - Simple Patient / Family Education for ongoing care 1 15 []  - 0 Complex (extensive) Patient / Family Education for ongoing care []  - 0 Staff obtains Programmer, systems, Records, Test Results / Process Orders []  - 0 Staff telephones HHA, Nursing Homes / Clarify orders / etc []  - 0 Routine Transfer to another Facility (non-emergent condition) []  - 0 Routine Hospital Admission (non-emergent condition) []  - 0 New Admissions / Biomedical engineer / Ordering NPWT, Apligraf, etc. []  - 0 Emergency Hospital Admission (emergent condition) X- 1 10 Simple Discharge Coordination Eisinger, [name] ([id number]) []  - 0 Complex (extensive) Discharge Coordination PROCESS - Special Needs []  - Pediatric / Minor Patient Management 0 []  - 0 Isolation Patient Management []  - 0 Hearing / Language / Visual special needs []  - 0 Assessment of Community assistance (transportation, D/C planning, etc.) []  -  0 Additional assistance / Altered mentation []  - 0 Support Surface(s) Assessment (bed, cushion, seat, etc.) INTERVENTIONS - Wound Cleansing / Measurement X - Simple Wound Cleansing - one wound 1 5 []  -  0 Complex Wound Cleansing - multiple wounds X- 1 5 Wound Imaging (photographs - any number of wounds) []  - 0 Wound Tracing (instead of photographs) X- 1 5 Simple Wound Measurement - one wound []  - 0 Complex Wound Measurement - multiple wounds INTERVENTIONS - Wound Dressings []  - Small Wound Dressing one or multiple wounds 0 X- 1 15 Medium Wound Dressing one or multiple wounds []  - 0 Large Wound Dressing one or multiple wounds []  - 0 Application of Medications - topical []  - 0 Application of Medications - injection INTERVENTIONS - Miscellaneous []  - External ear exam 0 []  - 0 Specimen Collection (cultures, biopsies, blood, body fluids, etc.) []  - 0 Specimen(s) / Culture(s) sent or taken to Lab for analysis []  - 0 Patient Transfer (multiple staff / Civil Service fast streamer / Similar devices) []  - 0 Simple Staple / Suture removal (25 or less) []  - 0 Complex Staple / Suture removal (26 or more) []  - 0 Hypo / Hyperglycemic Management (close monitor of Blood Glucose) []  - 0 Ankle / Brachial Index (ABI) - do not check if billed separately X- 1 5 Vital Signs Siefring, [name] ([id number]) Has the patient been seen at the hospital within the last three years: Yes Total Score: 70 Level Of Care: New/Established - Level 2 Electronic Signature(s) Signed: 03/14/2018 9:08:37 AM By: Gretta Cool, BSN, RN, CWS, Kim RN, BSN Entered By: Gretta Cool, BSN, RN, CWS, Kim on 03/09/2018 15:52:26 Salih, [name] ([id number]) -------------------------------------------------------------------------------- Encounter Discharge Information Details Patient Name: [name]. Date of Service: 03/09/2018 3:30 PM Medical Record Number: [id number] Patient Account Number: [id number] Date of Birth/Sex: 1971-04-16 (47 y.o.  M) Treating RN: Ahmed Prima Primary Care Abel Hageman: Einar Pheasant Other Clinician: Referring Geramy Lamorte: Einar Pheasant Treating Beulah Capobianco/Extender: Tito Dine in Treatment: 3 Encounter Discharge Information Items Discharge Pain Level: 0 Discharge Condition: Stable Ambulatory Status: Wheelchair Discharge Destination: Home Transportation: Private Auto Accompanied By: self Schedule Follow-up Appointment: Yes Medication Reconciliation completed and No provided to Patient/Care Jj Enyeart: Patient Clinical Summary of Care: Declined Electronic Signature(s) Signed: 03/09/2018 4:04:39 PM By: Ruthine Dose Entered By: Ruthine Dose on 03/09/2018 16:02:45 Lam[place], [name] ([id number]) -------------------------------------------------------------------------------- Lower Extremity Assessment Details Patient Name: [name]. Date of Service: 03/09/2018 3:30 PM Medical Record Number: [id number] Patient Account Number: [id number] Date of Birth/Sex: 19-Nov-1971 (46 y.o. M) Treating RN: Montey Hora Primary Care Jacqlyn Marolf: Einar Pheasant Other Clinician: Referring Rashidah [place]: Einar Pheasant Treating Brittiny Levitz/Extender: Tito Dine in Treatment: 3 Vascular Assessment Pulses: Dorsalis Pedis Palpable: [Left:Yes] Posterior Tibial Extremity colors, hair growth, and conditions: Extremity Color: [Left:Mottled] Hair Growth on Extremity: [Left:No] Temperature of Extremity: [Left:Cool] Capillary Refill: [Left:< 3 seconds] Electronic Signature(s) Signed: 03/09/2018 4:21:37 PM By: Montey Hora Entered By: Montey Hora on 03/09/2018 15:44:45 [place], [name] ([id number]) -------------------------------------------------------------------------------- Multi Wound Chart Details Patient Name: [name]. Date of Service: 03/09/2018 3:30 PM Medical Record Number: [id number] Patient Account Number: [id number] Date of Birth/Sex: [birth year] (47 y.o. 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] [N/A:N/A] Comorbid History: [7:Type I Diabetes, History of pressure wounds, Paraplegia] [N/A:N/A] Date Acquired: [7:02/10/2018] [N/A:N/A] Weeks of Treatment: [7:3] [N/A:N/A] Wound Status: [7:Open] [  N/A:N/A] Measurements L x W x D [7:0.8x0.9x0.1] [N/A:N/A] (cm) Area (cm) : [7:0.565] [N/A:N/A] Volume (cm) : [7:0.057] [N/A:N/A] % Reduction in Area: [7:14.40%] [N/A:N/A] % Reduction in Volume: [7:56.80%] [N/A:N/A] Classification: [7:Category/Stage III] [N/A:N/A] Exudate Amount: [7:Small] [N/A:N/A] Exudate Type: [7:Serosanguineous] [N/A:N/A] Exudate Color: [7:red, brown] [N/A:N/A] Wound Margin: [7:Distinct, outline attached] [N/A:N/A] Granulation Amount: [7:Large (67-100%)] [N/A:N/A] Granulation Quality: [7:Red] [N/A:N/A] Necrotic Amount: [7:Small (1-33%)] [N/A:N/A] Exposed Structures: [7:Fascia: No Fat Layer (Subcutaneous Tissue) Exposed: No Tendon: No Muscle: No Joint: No Bone: No] [N/A:N/A] Epithelialization: [7:None] [N/A:N/A] Periwound Skin Texture: [7:Excoriation: No Induration: No Callus: No Crepitus: No] [N/A:N/A] Rash: No Scarring: No Periwound Skin Moisture: No Abnormalities Noted N/A N/A Periwound Skin Color: Atrophie Blanche: No N/A N/A Cyanosis: No Ecchymosis: No Erythema: No Hemosiderin Staining: No Mottled: No Pallor: No Rubor: No Temperature: No Abnormality N/A N/A Tenderness on Palpation: No N/A  N/A Wound Preparation: Ulcer Cleansing: N/A N/A Rinsed/Irrigated with Saline Topical Anesthetic Applied: Other: lidocaine 4% Treatment Notes Wound #7 (Left Calcaneus) 1. Cleansed with: Clean wound with Normal Saline 2. Anesthetic Topical Lidocaine 4% cream to wound bed prior to debridement 4. Dressing Applied: Prisma Ag 5. Secondary Dressing Applied Bordered Foam Dressing Electronic Signature(s) Signed: 03/09/2018 4:49:21 PM By: Linton Ham MD Entered By: Linton Ham on 03/09/2018 15:57:27 [place], [name] ([id number]) -------------------------------------------------------------------------------- [place] Details Patient Name: [name]. Date of Service: 03/09/2018 3:30 PM Medical Record Number: [id number] Patient Account Number: [id number] Date of Birth/Sex: 02-Dec-1971 (47 y.o. M) Treating RN: Cornell Barman Primary Care Jelene Albano: Einar Pheasant Other Clinician: Referring Janyah Singleterry: Einar Pheasant Treating Nasiah Lehenbauer/Extender: Tito Dine in Treatment: 3 Active Inactive ` Orientation to the Wound Care Program Nursing Diagnoses: Knowledge deficit related to the wound healing center program Goals: Patient/caregiver will verbalize understanding of the [place] Program Date Initiated: 02/16/2018 Target Resolution Date: 03/19/2017 Goal Status: Active Interventions: Provide education on orientation to the wound center Notes: ` Pressure Nursing Diagnoses: Knowledge deficit related to management of pressures ulcers Goals: Patient/caregiver will verbalize risk factors for pressure ulcer development Date Initiated: 02/16/2018 Target Resolution Date: 03/19/2018 Goal Status: Active Interventions: Assess: immobility, friction, shearing, incontinence upon admission and as needed Notes: ` Soft Tissue Infection Nursing Diagnoses: Impaired tissue integrity Potential for infection: soft tissue Goals: Patient will remain free of  wound infection Date Initiated: 02/16/2018 Target Resolution Date: 03/19/2018 Goal Status: Active DIAMOND, MARTUCCI ([id number]) Interventions: Assess signs and symptoms of infection every visit Notes: ` Wound/Skin Impairment Nursing Diagnoses: Impaired tissue integrity Goals: Ulcer/skin breakdown will heal within 14 weeks Date Initiated: 02/16/2018 Target Resolution Date: 06/18/2018 Goal Status: Active Interventions: Assess ulceration(s) every visit Treatment Activities: Topical wound management initiated : 02/16/2018 Notes: Electronic Signature(s) Signed: 03/14/2018 9:08:37 AM By: Gretta Cool, BSN, RN, CWS, Kim RN, BSN Entered By: Gretta Cool, BSN, RN, CWS, Kim on 03/09/2018 15:50:36 [place], [name] ([id number]) -------------------------------------------------------------------------------- Pain Assessment Details Patient Name: [name]. Date of Service: 03/09/2018 3:30 PM Medical Record Number: [id number] Patient Account Number: [id number] Date of Birth/Sex: 1971-04-25 (47 y.o. M) Treating RN: Montey Hora Primary Care Suprina Mand[place]: Einar Pheasant Other Clinician: Referring Cire Deyarmin: Einar Pheasant Treating Rigoberto Repass/Extender: Tito Dine in Treatment: 3 Active Problems Location of Pain Severity and Description of Pain Patient Has Paino No Site Locations Pain Management and Medication Current Pain Management: Electronic Signature(s) Signed: 03/09/2018 4:21:37 PM By: Montey Hora Entered By: Montey Hora on 03/09/2018 15:36:32 [place], [name] ([id number]) -------------------------------------------------------------------------------- Patient/Caregiver Education Details Patient Name: [name]. Date of Service: 03/09/2018 3:30  PM Medical Record Number: [id number] Patient Account Number: [id number] Date of Birth/Gender: 1971-09-12 (47 y.o. M) Treating RN: Ahmed Prima Primary Care Physician: Einar Pheasant Other Clinician: Referring Physician: Einar Pheasant Treating Physician/Extender: Tito Dine in Treatment: 3 Education Assessment Education Provided To: Patient Education Topics Provided Wound/Skin Impairment: Handouts: Caring for Your Ulcer, Other: change dressing as ordered Methods: Demonstration, Explain/Verbal Responses: State content correctly Electronic Signature(s) Signed: 03/10/2018 4:32:30 PM By: Alric Quan Entered By: Alric Quan on 03/09/2018 15:57:01 [place], [name] ([id number]) -------------------------------------------------------------------------------- Wound Assessment Details Patient Name: [name]. Date of Service: 03/09/2018 3:30 PM Medical Record Number: [id number] Patient Account Number: [id number] Date of Birth/Sex: 1971/07/09 (47 y.o. M) Treating RN: Montey Hora Primary Care Adriell Polansky: Einar Pheasant Other Clinician: Referring Wonda Goodgame: Einar Pheasant Treating Brandace Cargle/Extender: Tito Dine in Treatment: 3 Wound Status Wound Number: 7 Primary Etiology: Pressure Ulcer Wound Location: Left Calcaneus Secondary Diabetic Wound/Ulcer of the Lower Etiology: Extremity Wounding Event: Pressure Injury Wound Status: Open Date Acquired: 02/10/2018 Comorbid Type I Diabetes, History of pressure Weeks Of Treatment: 3 History: wounds, Paraplegia Clustered Wound: No Photos Photo Uploaded By: Montey Hora on 03/09/2018 16:13:09 Wound Measurements Length: (cm) 0.8 Width: (cm) 0.9 Depth: (cm) 0.1 Area: (cm) 0.565 Volume: (cm) 0.057 % Reduction in Area: 14.4% % Reduction in Volume: 56.8% Epithelialization: None Tunneling: No Undermining: No Wound Description Classification: Category/Stage III Foul O Wound Margin: Distinct, outline attached Slough Exudate Amount: Small Exudate Type: Serosanguineous Exudate Color: red, brown dor After Cleansing: No /Fibrino Yes Wound Bed Granulation Amount: Large (67-100%) Exposed Structure Granulation Quality:  Red Fascia Exposed: No Necrotic Amount: Small (1-33%) Fat Layer (Subcutaneous Tissue) Exposed: No Necrotic Quality: Adherent Slough Tendon Exposed: No Muscle Exposed: No Joint Exposed: No Bone Exposed: No Periwound Skin Texture Wiltse, [name] ([id number]) Texture Color No Abnormalities Noted: No No Abnormalities Noted: No Callus: No Atrophie Blanche: No Crepitus: No Cyanosis: No Excoriation: No Ecchymosis: No Induration: No Erythema: No Rash: No Hemosiderin Staining: No Scarring: No Mottled: No Pallor: No Moisture Rubor: No No Abnormalities Noted: No Temperature / Pain Temperature: No Abnormality Wound Preparation Ulcer Cleansing: Rinsed/Irrigated with Saline Topical Anesthetic Applied: Other: lidocaine 4%, Treatment Notes Wound #7 (Left Calcaneus) 1. Cleansed with: Clean wound with Normal Saline 2. Anesthetic Topical Lidocaine 4% cream to wound bed prior to debridement 4. Dressing Applied: Prisma Ag 5. Secondary Dressing Applied Bordered Foam Dressing Electronic Signature(s) Signed: 03/09/2018 4:21:37 PM By: Montey Hora Entered By: Montey Hora on 03/09/2018 15:44:05 [place], [name] ([id number]) -------------------------------------------------------------------------------- Vitals Details Patient Name: [name]. Date of Service: 03/09/2018 3:30 PM Medical Record Number: [id number] Patient Account Number: [id number] Date of Birth/Sex: 05-10-1971 (46 y.o. M) Treating RN: Montey Hora Primary Care Jamaiyah Pyle: Einar Pheasant Other Clinician: Referring Jahniya Duzan: Einar Pheasant Treating Gottfried Standish/Extender: Tito Dine in Treatment: 3 Vital Signs Time Taken: 15:38 Temperature (F): 97.7 Pulse (bpm): 62 Respiratory Rate (breaths/min): 18 Blood Pressure (mmHg): 107/68 Reference Range: 80 - 120 mg / dl Electronic Signature(s) Signed: 03/09/2018 4:21:37 PM By: Montey Hora Entered By: Montey Hora on 03/09/2018 15:37:05

## 2018-03-23 ENCOUNTER — Encounter: Payer: Medicare Other | Admitting: Internal Medicine

## 2018-03-23 DIAGNOSIS — L97422 Non-pressure chronic ulcer of left heel and midfoot with fat layer exposed: Secondary | ICD-10-CM | POA: Diagnosis not present

## 2018-03-23 DIAGNOSIS — M79672 Pain in left foot: Secondary | ICD-10-CM | POA: Diagnosis not present

## 2018-03-23 DIAGNOSIS — L89623 Pressure ulcer of left heel, stage 3: Secondary | ICD-10-CM | POA: Diagnosis not present

## 2018-03-23 DIAGNOSIS — G8250 Quadriplegia, unspecified: Secondary | ICD-10-CM | POA: Diagnosis not present

## 2018-03-23 DIAGNOSIS — M50822 Other cervical disc disorders at C5-C6 level: Secondary | ICD-10-CM | POA: Diagnosis not present

## 2018-03-23 DIAGNOSIS — E109 Type 1 diabetes mellitus without complications: Secondary | ICD-10-CM | POA: Diagnosis not present
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
## 2018-03-28 NOTE — Progress Notes (Signed)
CARTHEL, CASTILLE ([id number]) Visit Report for 03/23/2018 Arrival Information Details Patient Name: [name], [name]. Date of Service: 03/23/2018 3:00 PM Medical Record Number: [id number] Patient Account Number: [id number] Date of Birth/Sex: [birth year] (47 y.o. M) Treating RN: Roger Shelter Primary Care Lyncoln Maskell: Einar Pheasant Other Clinician: Referring Sena Hoopingarner: Einar Pheasant Treating Nero Sawatzky/Extender: Tito Dine in Treatment: 5 Visit Information History Since Last Visit All ordered tests and consults were completed: No Patient Arrived: Wheel Chair Added or deleted any medications: No Arrival Time: 15:25 Any new allergies or adverse reactions: No Accompanied By: self Had a fall or experienced change in No Transfer Assistance: None activities of daily living that may affect Patient Identification Verified: Yes risk of falls: Secondary Verification Process Completed: Yes Signs or symptoms of abuse/neglect since last visito No Patient Requires Transmission-Based No Hospitalized since last visit: No Precautions: Implantable device outside of the clinic excluding No Patient Has Alerts: Yes cellular tissue based products placed in the center Patient Alerts: Type I since last visit: Diabetic Pain Present Now: Yes Electronic Signature(s) Signed: 03/23/2018 4:34:44 PM By: Roger Shelter Entered By: Roger Shelter on 03/23/2018 15:25:49 Bettcher, [name] ([id number]) -------------------------------------------------------------------------------- Clinic Level of Care Assessment Details Patient Name: [name]. Date of Service: 03/23/2018 3:00 PM Medical Record Number: [id number] Patient Account Number: [id number] Date of Birth/Sex: September 05, 1971 (47 y.o. M) Treating RN: Cornell Barman Primary Care Sujata Maines: Einar Pheasant Other Clinician: Referring Ammi Hutt: Einar Pheasant Treating Meha Vidrine/Extender: Tito Dine in Treatment: 5 Clinic Level of Care  Assessment Items TOOL 4 Quantity Score []  - Use when only an EandM is performed on FOLLOW-UP visit 0 ASSESSMENTS - Nursing Assessment / Reassessment []  - Reassessment of Co-morbidities (includes updates in patient status) 0 X- 1 5 Reassessment of Adherence to Treatment Plan ASSESSMENTS - Wound and Skin Assessment / Reassessment X - Simple Wound Assessment / Reassessment - one wound 1 5 []  - 0 Complex Wound Assessment / Reassessment - multiple wounds []  - 0 Dermatologic / Skin Assessment (not related to wound area) ASSESSMENTS - Focused Assessment []  - Circumferential Edema Measurements - multi extremities 0 []  - 0 Nutritional Assessment / Counseling / Intervention []  - 0 Lower Extremity Assessment (monofilament, tuning fork, pulses) []  - 0 Peripheral Arterial Disease Assessment (using hand held doppler) ASSESSMENTS - Ostomy and/or Continence Assessment and Care []  - Incontinence Assessment and Management 0 []  - 0 Ostomy Care Assessment and Management (repouching, etc.) PROCESS - Coordination of Care X - Simple Patient / Family Education for ongoing care 1 15 []  - 0 Complex (extensive) Patient / Family Education for ongoing care []  - 0 Staff obtains Programmer, systems, Records, Test Results / Process Orders []  - 0 Staff telephones HHA, Nursing Homes / Clarify orders / etc []  - 0 Routine Transfer to another Facility (non-emergent condition) []  - 0 Routine Hospital Admission (non-emergent condition) []  - 0 New Admissions / Biomedical engineer / Ordering NPWT, Apligraf, etc. []  - 0 Emergency Hospital Admission (emergent condition) X- 1 10 Simple Discharge Coordination Sessums, [name] ([id number]) []  - 0 Complex (extensive) Discharge Coordination PROCESS - Special Needs []  - Pediatric / Minor Patient Management 0 []  - 0 Isolation Patient Management []  - 0 Hearing / Language / Visual special needs []  - 0 Assessment of Community assistance (transportation, D/C planning,  etc.) []  - 0 Additional assistance / Altered mentation []  - 0 Support Surface(s) Assessment (bed, cushion, seat, etc.) INTERVENTIONS - Wound Cleansing / Measurement X - Simple Wound Cleansing - one wound 1  5 []  - 0 Complex Wound Cleansing - multiple wounds X- 1 5 Wound Imaging (photographs - any number of wounds) []  - 0 Wound Tracing (instead of photographs) X- 1 5 Simple Wound Measurement - one wound []  - 0 Complex Wound Measurement - multiple wounds INTERVENTIONS - Wound Dressings X - Small Wound Dressing one or multiple wounds 1 10 []  - 0 Medium Wound Dressing one or multiple wounds []  - 0 Large Wound Dressing one or multiple wounds []  - 0 Application of Medications - topical []  - 0 Application of Medications - injection INTERVENTIONS - Miscellaneous []  - External ear exam 0 []  - 0 Specimen Collection (cultures, biopsies, blood, body fluids, etc.) []  - 0 Specimen(s) / Culture(s) sent or taken to Lab for analysis []  - 0 Patient Transfer (multiple staff / Civil Service fast streamer / Similar devices) []  - 0 Simple Staple / Suture removal (25 or less) []  - 0 Complex Staple / Suture removal (26 or more) []  - 0 Hypo / Hyperglycemic Management (close monitor of Blood Glucose) []  - 0 Ankle / Brachial Index (ABI) - do not check if billed separately X- 1 5 Vital Signs Pereira, [name] ([id number]) Has the patient been seen at the hospital within the last three years: Yes Total Score: 65 Level Of Care: New/Established - Level 2 Electronic Signature(s) Signed: 03/23/2018 5:11:52 PM By: Gretta Cool, BSN, RN, CWS, Kim RN, BSN Entered By: Gretta Cool, BSN, RN, CWS, Kim on 03/23/2018 15:55:34 Ragain, [name] ([id number]) -------------------------------------------------------------------------------- Encounter Discharge Information Details Patient Name: [name]. Date of Service: 03/23/2018 3:00 PM Medical Record Number: [id number] Patient Account Number: [id number] Date of Birth/Sex: 01-Dec-1971  (47 y.o. M) Treating RN: Cornell Barman Primary Care Beena Catano: Einar Pheasant Other Clinician: Referring Ebrima Ranta: Einar Pheasant Treating Bryli Mantey/Extender: Tito Dine in Treatment: 5 Encounter Discharge Information Items Discharge Pain Level: 0 Discharge Condition: Stable Ambulatory Status: Wheelchair Discharge Destination: Home Transportation: Private Auto Accompanied By: self Schedule Follow-up Appointment: Yes Medication Reconciliation completed and No provided to Patient/Care Mikle Sternberg: Patient Clinical Summary of Care: Declined Electronic Signature(s) Signed: 03/23/2018 4:36:26 PM By: Montey Hora Entered By: Montey Hora on 03/23/2018 16:36:26 Gaines, [name] ([id number]) -------------------------------------------------------------------------------- Lower Extremity Assessment Details Patient Name: [name]. Date of Service: 03/23/2018 3:00 PM Medical Record Number: [id number] Patient Account Number: [id number] Date of Birth/Sex: 1971/07/31 (47 y.o. M) Treating RN: Roger Shelter Primary Care Devun Anna: Einar Pheasant Other Clinician: Referring Jahzion Brogden: Einar Pheasant Treating Keishaun Hazel/Extender: Tito Dine in Treatment: 5 Edema Assessment Assessed: [Left: No] [Right: No] Edema: [Left: N] [Right: o] Vascular Assessment Claudication: Claudication Assessment [Left:None] Pulses: Dorsalis Pedis Palpable: [Left:Yes] Posterior Tibial Extremity colors, hair growth, and conditions: Extremity Color: [Left:Normal] Hair Growth on Extremity: [Left:Yes] Temperature of Extremity: [Left:Warm] Capillary Refill: [Left:< 3 seconds] Toe Nail Assessment Left: Right: Thick: No Discolored: No Deformed: No Improper Length and Hygiene: No Electronic Signature(s) Signed: 03/23/2018 4:34:44 PM By: Roger Shelter Entered By: Roger Shelter on 03/23/2018 15:35:30 Gradillas, [name]  ([id number]) -------------------------------------------------------------------------------- Multi Wound Chart Details Patient Name: [name]. Date of Service: 03/23/2018 3:00 PM Medical Record Number: [id number] Patient Account Number: [id number] Date of Birth/Sex: June 17, 1971 (47 y.o. M) Treating RN: Cornell Barman Primary Care Kellyanne Ellwanger: Einar Pheasant Other Clinician: Referring Kyndel Egger: Einar Pheasant Treating Sheana Bir/Extender: Tito Dine in Treatment: 5 Vital Signs Height(in): Pulse(bpm): 59 Weight(lbs): Blood Pressure(mmHg): 109/69 Body Mass Index(BMI): Temperature(F): 98.4 Respiratory Rate 18 (breaths/min): Photos: [N/A:N/A] Wound Location: Left Calcaneus N/A N/A Wounding Event: Pressure Injury N/A N/A Primary  Etiology: Pressure Ulcer N/A N/A Secondary Etiology: Diabetic Wound/Ulcer of the N/A N/A Lower Extremity Comorbid History: Type I Diabetes, History of N/A N/A pressure wounds, Paraplegia Date Acquired: 02/10/2018 N/A N/A Weeks of Treatment: 5 N/A N/A Wound Status: Open N/A N/A Measurements L x W x D 0.5x0.9x0.1 N/A N/A (cm) Area (cm) : 0.353 N/A N/A Volume (cm) : 0.035 N/A N/A % Reduction in Area: 46.50% N/A N/A % Reduction in Volume: 73.50% N/A N/A Classification: Category/Stage III N/A N/A Exudate Amount: Medium N/A N/A Exudate Type: Serosanguineous N/A N/A Exudate Color: red, brown N/A N/A Wound Margin: Distinct, outline attached N/A N/A Granulation Amount: Large (67-100%) N/A N/A Granulation Quality: Red N/A N/A Necrotic Amount: Small (1-33%) N/A N/A Exposed Structures: Fascia: No N/A N/A Fat Layer (Subcutaneous Tissue) Exposed: No Tendon: No Muscle: No Rocchio, [name] ([id number]) Joint: No Bone: No Epithelialization: None N/A N/A Periwound Skin Texture: Excoriation: No N/A N/A Induration: No Callus: No Crepitus: No Rash: No Scarring: No Periwound Skin Moisture: No Abnormalities Noted N/A N/A Periwound Skin  Color: Atrophie Blanche: No N/A N/A Cyanosis: No Ecchymosis: No Erythema: No Hemosiderin Staining: No Mottled: No Pallor: No Rubor: No Temperature: No Abnormality N/A N/A Tenderness on Palpation: No N/A N/A Wound Preparation: Ulcer Cleansing: N/A N/A Rinsed/Irrigated with Saline Topical Anesthetic Applied: Other: lidocaine 4% Treatment Notes Wound #7 (Left Calcaneus) 1. Cleansed with: Clean wound with Normal Saline 2. Anesthetic Topical Lidocaine 4% cream to wound bed prior to debridement 3. Peri-wound Care: Skin Prep 4. Dressing Applied: Prisma Ag 5. Secondary Dressing Applied Bordered Foam Dressing Electronic Signature(s) Signed: 03/23/2018 5:09:03 PM By: Linton Ham MD Entered By: Linton Ham on 03/23/2018 17:02:39 Polanco, [name] ([id number]) -------------------------------------------------------------------------------- [place] Details Patient Name: [name]. Date of Service: 03/23/2018 3:00 PM Medical Record Number: [id number] Patient Account Number: [id number] Date of Birth/Sex: 1971-02-16 (47 y.o. M) Treating RN: Cornell Barman Primary Care Ashleyann Shoun: Einar Pheasant Other Clinician: Referring Kaylon Laroche: Einar Pheasant Treating Anastacio Bua/Extender: Tito Dine in Treatment: 5 Active Inactive ` Orientation to the Wound Care Program Nursing Diagnoses: Knowledge deficit related to the wound healing center program Goals: Patient/caregiver will verbalize understanding of the [facility] Program Date Initiated: 02/16/2018 Target Resolution Date: 03/19/2017 Goal Status: Active Interventions: Provide education on orientation to the wound center Notes: ` Pressure Nursing Diagnoses: Knowledge deficit related to management of pressures ulcers Goals: Patient/caregiver will verbalize risk factors for pressure ulcer development Date Initiated: 02/16/2018 Target Resolution Date: 03/19/2018 Goal Status:  Active Interventions: Assess: immobility, friction, shearing, incontinence upon admission and as needed Notes: ` Soft Tissue Infection Nursing Diagnoses: Impaired tissue integrity Potential for infection: soft tissue Goals: Patient will remain free of wound infection Date Initiated: 02/16/2018 Target Resolution Date: 03/19/2018 Goal Status: Active CASMERE, HOLLENBECK ([id number]) Interventions: Assess signs and symptoms of infection every visit Notes: ` Wound/Skin Impairment Nursing Diagnoses: Impaired tissue integrity Goals: Ulcer/skin breakdown will heal within 14 weeks Date Initiated: 02/16/2018 Target Resolution Date: 06/18/2018 Goal Status: Active Interventions: Assess ulceration(s) every visit Treatment Activities: Topical wound management initiated : 02/16/2018 Notes: Electronic Signature(s) Signed: 03/23/2018 5:11:52 PM By: Gretta Cool, BSN, RN, CWS, Kim RN, BSN Entered By: Gretta Cool, BSN, RN, CWS, Kim on 03/23/2018 15:48:59 Wlodarczyk, [name] ([id number]) -------------------------------------------------------------------------------- Pain Assessment Details Patient Name: [name]. Date of Service: 03/23/2018 3:00 PM Medical Record Number: [id number] Patient Account Number: [id number] Date of Birth/Sex: 1971-08-16 (47 y.o. M) Treating RN: Roger Shelter Primary Care Deliana Avalos: Einar Pheasant Other Clinician: Referring Vear Staton: Nicki Reaper,  CHARLENE Treating Kashmir Leedy/Extender: Ricard Dillon Weeks in Treatment: 5 Active Problems Location of Pain Severity and Description of Pain Patient Has Paino Yes Site Locations Rate the pain. Current Pain Level: 2 Character of Pain Describe the Pain: Burning Pain Management and Medication Current Pain Management: Electronic Signature(s) Signed: 03/23/2018 4:34:44 PM By: Roger Shelter Entered By: Roger Shelter on 03/23/2018 15:26:00 Roediger, [name]  ([id number]) -------------------------------------------------------------------------------- Patient/Caregiver Education Details Patient Name: [name]. Date of Service: 03/23/2018 3:00 PM Medical Record Number: [id number] Patient Account Number: [id number] Date of Birth/Gender: [birth year] (47 y.o. M) Treating RN: Montey Hora Primary Care Physician: Einar Pheasant Other Clinician: Referring Physician: Einar Pheasant Treating Physician/Extender: Tito Dine in Treatment: 5 Education Assessment Education Provided To: Patient Education Topics Provided Nutrition: Handouts: Elevated Blood Sugars: How Do They Affect Wound Healing Methods: Explain/Verbal Responses: State content correctly Wound/Skin Impairment: Handouts: Other: wound care as ordered Methods: Demonstration, Explain/Verbal Responses: State content correctly Electronic Signature(s) Signed: 03/23/2018 4:39:21 PM By: Montey Hora Entered By: Montey Hora on 03/23/2018 16:37:31 Elderon, [name] ([id number]) -------------------------------------------------------------------------------- Wound Assessment Details Patient Name: [name]. Date of Service: 03/23/2018 3:00 PM Medical Record Number: [id number] Patient Account Number: [id number] Date of Birth/Sex: 10/08/1971 (47 y.o. M) Treating RN: Roger Shelter Primary Care Peighton Edgin: Einar Pheasant Other Clinician: Referring Wise Fees: Einar Pheasant Treating Lois Ostrom/Extender: Tito Dine in Treatment: 5 Wound Status Wound Number: 7 Primary Etiology: Pressure Ulcer Wound Location: Left Calcaneus Secondary Diabetic Wound/Ulcer of the Lower Etiology: Extremity Wounding Event: Pressure Injury Wound Status: Open Date Acquired: 02/10/2018 Comorbid Type I Diabetes, History of pressure Weeks Of Treatment: 5 History: wounds, Paraplegia Clustered Wound: No Photos Photo Uploaded By: Roger Shelter on 03/23/2018 16:30:06 Wound  Measurements Length: (cm) 0.5 Width: (cm) 0.9 Depth: (cm) 0.1 Area: (cm) 0.353 Volume: (cm) 0.035 % Reduction in Area: 46.5% % Reduction in Volume: 73.5% Epithelialization: None Tunneling: No Undermining: No Wound Description Classification: Category/Stage III Foul Wound Margin: Distinct, outline attached Slou Exudate Amount: Medium Exudate Type: Serosanguineous Exudate Color: red, brown Odor After Cleansing: No gh/Fibrino Yes Wound Bed Granulation Amount: Large (67-100%) Exposed Structure Granulation Quality: Red Fascia Exposed: No Necrotic Amount: Small (1-33%) Fat Layer (Subcutaneous Tissue) Exposed: No Necrotic Quality: Adherent Slough Tendon Exposed: No Muscle Exposed: No Joint Exposed: No Bone Exposed: No Periwound Skin Texture Steinberger, [name] ([id number]) Texture Color No Abnormalities Noted: No No Abnormalities Noted: No Callus: No Atrophie Blanche: No Crepitus: No Cyanosis: No Excoriation: No Ecchymosis: No Induration: No Erythema: No Rash: No Hemosiderin Staining: No Scarring: No Mottled: No Pallor: No Moisture Rubor: No No Abnormalities Noted: No Temperature / Pain Temperature: No Abnormality Wound Preparation Ulcer Cleansing: Rinsed/Irrigated with Saline Topical Anesthetic Applied: Other: lidocaine 4%, Treatment Notes Wound #7 (Left Calcaneus) 1. Cleansed with: Clean wound with Normal Saline 2. Anesthetic Topical Lidocaine 4% cream to wound bed prior to debridement 3. Peri-wound Care: Skin Prep 4. Dressing Applied: Prisma Ag 5. Secondary Dressing Applied Bordered Foam Dressing Electronic Signature(s) Signed: 03/23/2018 4:34:44 PM By: Roger Shelter Entered By: Roger Shelter on 03/23/2018 15:33:28 Gluth, [name] ([id number]) -------------------------------------------------------------------------------- Vitals Details Patient Name: [name]. Date of Service: 03/23/2018 3:00 PM Medical Record Number:  [id number] Patient Account Number: [id number] Date of Birth/Sex: 1971/05/25 (47 y.o. M) Treating RN: Roger Shelter Primary Care Emmaly Leech: Einar Pheasant Other Clinician: Referring Shiniqua Groseclose: Einar Pheasant Treating Maridel Pixler/Extender: Tito Dine in Treatment: 5 Vital Signs Time Taken: 15:28 Temperature (F): 98.4 Pulse (bpm): 59 Respiratory Rate (breaths/min): 18 Blood  Pressure (mmHg): 109/69 Reference Range: 80 - 120 mg / dl Electronic Signature(s) Signed: 03/23/2018 4:34:44 PM By: Roger Shelter Entered By: Roger Shelter on 03/23/2018 15:29:32

## 2018-03-29 ENCOUNTER — Other Ambulatory Visit (HOSPITAL_BASED_OUTPATIENT_CLINIC_OR_DEPARTMENT_OTHER): Payer: Self-pay | Admitting: Internal Medicine

## 2018-03-29 ENCOUNTER — Encounter (INDEPENDENT_AMBULATORY_CARE_PROVIDER_SITE_OTHER): Payer: Self-pay | Admitting: Vascular Surgery

## 2018-03-29 ENCOUNTER — Ambulatory Visit (INDEPENDENT_AMBULATORY_CARE_PROVIDER_SITE_OTHER): Payer: Medicare Other

## 2018-03-29 ENCOUNTER — Ambulatory Visit (INDEPENDENT_AMBULATORY_CARE_PROVIDER_SITE_OTHER): Payer: Medicare Other | Admitting: Vascular Surgery

## 2018-03-29 VITALS — BP 163/97 | HR 53 | Resp 12 | Ht 72.0 in | Wt 175.0 lb

## 2018-03-29 DIAGNOSIS — L97421 Non-pressure chronic ulcer of left heel and midfoot limited to breakdown of skin: Secondary | ICD-10-CM

## 2018-03-29 DIAGNOSIS — L98491 Non-pressure chronic ulcer of skin of other sites limited to breakdown of skin: Secondary | ICD-10-CM

## 2018-03-29 DIAGNOSIS — E109 Type 1 diabetes mellitus without complications: Secondary | ICD-10-CM

## 2018-03-29 DIAGNOSIS — G8220 Paraplegia, unspecified: Secondary | ICD-10-CM | POA: Diagnosis not present

## 2018-03-29 NOTE — Assessment & Plan Note (Signed)
blood glucose control important in reducing the progression of atherosclerotic disease. Also, involved in wound healing. On appropriate medications.  
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
## 2018-03-30 ENCOUNTER — Encounter: Payer: Medicare Other | Admitting: Internal Medicine

## 2018-03-30 DIAGNOSIS — E11628 Type 2 diabetes mellitus with other skin complications: Secondary | ICD-10-CM | POA: Diagnosis not present

## 2018-03-30 DIAGNOSIS — E109 Type 1 diabetes mellitus without complications: Secondary | ICD-10-CM | POA: Diagnosis not present

## 2018-03-30 DIAGNOSIS — L89623 Pressure ulcer of left heel, stage 3: Secondary | ICD-10-CM | POA: Diagnosis not present

## 2018-03-30 DIAGNOSIS — L97422 Non-pressure chronic ulcer of left heel and midfoot with fat layer exposed: Secondary | ICD-10-CM | POA: Diagnosis not present

## 2018-03-30 DIAGNOSIS — M50822 Other cervical disc disorders at C5-C6 level: Secondary | ICD-10-CM | POA: Diagnosis not present

## 2018-03-30 DIAGNOSIS — G8250 Quadriplegia, unspecified: Secondary | ICD-10-CM | POA: Diagnosis not present

## 2018-03-30 NOTE — Assessment & Plan Note (Signed)
He likely has some vasospastic issues similar to Raynauds or RSD in his left foot creating the discoloration and coolness.  This is common with paraplegia or neuropathic injuries.

## 2018-03-30 NOTE — Assessment & Plan Note (Signed)
To assess his perfusion, ABIs were done today.  His right ABI was 1.02 and his left ABI was 0.92.  He has brisk triphasic waveforms bilaterally with good digital waveforms and pressures bilaterally.  No evidence of arterial insufficiency was seen today. 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  No vascular intervention or further evaluation is warranted at this time.  Follow-up as needed.

## 2018-04-03 NOTE — Progress Notes (Signed)
SHADEN, LACHER ([id number]) Visit Report for 03/30/2018 Arrival Information Details Patient Name: [name], [name]. Date of Service: 03/30/2018 3:30 PM Medical Record Number: [id number] Patient Account Number: [id number] Date of Birth/Sex: [birth year] (47 y.o. M) Treating RN: Roger Shelter Primary Care Lakea Mittelman: Einar Pheasant Other Clinician: Referring Edwardine Deschepper: Einar Pheasant Treating Alydia Gosser/Extender: Tito Dine in Treatment: 6 Visit Information History Since Last Visit All ordered tests and consults were completed: No Patient Arrived: Wheel Chair Added or deleted any medications: No Arrival Time: 15:40 Any new allergies or adverse reactions: No Accompanied By: self Had a fall or experienced change in No Transfer Assistance: None activities of daily living that may affect Patient Identification Verified: Yes risk of falls: Secondary Verification Process Completed: Yes Signs or symptoms of abuse/neglect since last visito No Patient Requires Transmission-Based No Hospitalized since last visit: No Precautions: Implantable device outside of the clinic excluding No Patient Has Alerts: Yes cellular tissue based products placed in the center Patient Alerts: Type I since last visit: Diabetic Pain Present Now: No Electronic Signature(s) Signed: 03/30/2018 4:28:16 PM By: Roger Shelter Entered By: Roger Shelter on 03/30/2018 15:41:13 [place], [name] ([id number]) -------------------------------------------------------------------------------- Clinic Level of Care Assessment Details Patient Name: [name]. Date of Service: 03/30/2018 3:30 PM Medical Record Number: [id number] Patient Account Number: [id number] Date of Birth/Sex: 12/27/1970 (47 y.o. M) Treating RN: Cornell Barman Primary Care Alston Berrie: Einar Pheasant Other Clinician: Referring Gillian Kluever: Einar Pheasant Treating Adaline Trejos/Extender: Tito Dine in Treatment: 6 Clinic Level of Care  Assessment Items TOOL 4 Quantity Score []  - Use when only an EandM is performed on FOLLOW-UP visit 0 ASSESSMENTS - Nursing Assessment / Reassessment []  - Reassessment of Co-morbidities (includes updates in patient status) 0 X- 1 5 Reassessment of Adherence to Treatment Plan ASSESSMENTS - Wound and Skin Assessment / Reassessment X - Simple Wound Assessment / Reassessment - one wound 1 5 []  - 0 Complex Wound Assessment / Reassessment - multiple wounds []  - 0 Dermatologic / Skin Assessment (not related to wound area) ASSESSMENTS - Focused Assessment []  - Circumferential Edema Measurements - multi extremities 0 []  - 0 Nutritional Assessment / Counseling / Intervention []  - 0 Lower Extremity Assessment (monofilament, tuning fork, pulses) []  - 0 Peripheral Arterial Disease Assessment (using hand held doppler) ASSESSMENTS - Ostomy and/or Continence Assessment and Care []  - Incontinence Assessment and Management 0 []  - 0 Ostomy Care Assessment and Management (repouching, etc.) PROCESS - Coordination of Care X - Simple Patient / Family Education for ongoing care 1 15 []  - 0 Complex (extensive) Patient / Family Education for ongoing care []  - 0 Staff obtains Programmer, systems, Records, Test Results / Process Orders []  - 0 Staff telephones HHA, Nursing Homes / Clarify orders / etc []  - 0 Routine Transfer to another Facility (non-emergent condition) []  - 0 Routine Hospital Admission (non-emergent condition) []  - 0 New Admissions / Biomedical engineer / Ordering NPWT, Apligraf, etc. []  - 0 Emergency Hospital Admission (emergent condition) X- 1 10 Simple Discharge Coordination Tapper, [name] ([id number]) []  - 0 Complex (extensive) Discharge Coordination PROCESS - Special Needs []  - Pediatric / Minor Patient Management 0 []  - 0 Isolation Patient Management []  - 0 Hearing / Language / Visual special needs []  - 0 Assessment of Community assistance (transportation, D/C planning,  etc.) []  - 0 Additional assistance / Altered mentation []  - 0 Support Surface(s) Assessment (bed, cushion, seat, etc.) INTERVENTIONS - Wound Cleansing / Measurement X - Simple Wound Cleansing - one wound 1  5 []  - 0 Complex Wound Cleansing - multiple wounds X- 1 5 Wound Imaging (photographs - any number of wounds) []  - 0 Wound Tracing (instead of photographs) X- 1 5 Simple Wound Measurement - one wound []  - 0 Complex Wound Measurement - multiple wounds INTERVENTIONS - Wound Dressings X - Small Wound Dressing one or multiple wounds 1 10 []  - 0 Medium Wound Dressing one or multiple wounds []  - 0 Large Wound Dressing one or multiple wounds []  - 0 Application of Medications - topical []  - 0 Application of Medications - injection INTERVENTIONS - Miscellaneous []  - External ear exam 0 []  - 0 Specimen Collection (cultures, biopsies, blood, body fluids, etc.) []  - 0 Specimen(s) / Culture(s) sent or taken to Lab for analysis []  - 0 Patient Transfer (multiple staff / Civil Service fast streamer / Similar devices) []  - 0 Simple Staple / Suture removal (25 or less) []  - 0 Complex Staple / Suture removal (26 or more) []  - 0 Hypo / Hyperglycemic Management (close monitor of Blood Glucose) []  - 0 Ankle / Brachial Index (ABI) - do not check if billed separately X- 1 5 Vital Signs Winecoff, [name] ([id number]) Has the patient been seen at the hospital within the last three years: Yes Total Score: 65 Level Of Care: New/Established - Level 2 Electronic Signature(s) Signed: 03/30/2018 8:51:29 PM By: Gretta Cool, BSN, RN, CWS, Kim RN, BSN Entered By: Gretta Cool, BSN, RN, CWS, Kim on 03/30/2018 16:18:27 [place], [name] ([id number]) -------------------------------------------------------------------------------- Encounter Discharge Information Details Patient Name: [name]. Date of Service: 03/30/2018 3:30 PM Medical Record Number: [id number] Patient Account Number: [id number] Date of Birth/Sex: [birth year]  (47 y.o. M) Treating RN: Cornell Barman Primary Care Arsal Tappan: Einar Pheasant Other Clinician: Referring Alyssia Heese: Einar Pheasant Treating Taino Maertens/Extender: Tito Dine in Treatment: 6 Encounter Discharge Information Items Discharge Pain Level: 0 Discharge Condition: Stable Ambulatory Status: Wheelchair Discharge Destination: Home Private Transportation: Auto Accompanied By: self Schedule Follow-up Appointment: Yes Medication Reconciliation completed and provided No to Patient/Care Ixel Boehning: Clinical Summary of Care: Electronic Signature(s) Signed: 03/30/2018 8:51:29 PM By: Gretta Cool, BSN, RN, CWS, Kim RN, BSN Entered By: Gretta Cool, BSN, RN, CWS, Kim on 03/30/2018 16:21:08 [place], [name] ([id number]) -------------------------------------------------------------------------------- Lower Extremity Assessment Details Patient Name: [name]. Date of Service: 03/30/2018 3:30 PM Medical Record Number: [id number] Patient Account Number: [id number] Date of Birth/Sex: 01-04-1971 (47 y.o. M) Treating RN: Roger Shelter Primary Care Hildagarde Holleran: Einar Pheasant Other Clinician: Referring Judia Arnott: Einar Pheasant Treating Reah Justo/Extender: Tito Dine in Treatment: 6 Edema Assessment Assessed: [Left: No] [Right: No] Edema: [Left: N] [Right: o] Vascular Assessment Claudication: Claudication Assessment [Left:None] Pulses: Dorsalis Pedis Palpable: [Left:Yes] Posterior Tibial Extremity colors, hair growth, and conditions: Extremity Color: [Left:Normal] Hair Growth on Extremity: [Left:Yes] Temperature of Extremity: [Left:Warm] Capillary Refill: [Left:< 3 seconds] Toe Nail Assessment Left: Right: Thick: No Discolored: No Deformed: No Improper Length and Hygiene: No Electronic Signature(s) Signed: 03/30/2018 4:28:16 PM By: Roger Shelter Entered By: Roger Shelter on 03/30/2018 15:47:45 [place], [name]  ([id number]) -------------------------------------------------------------------------------- Multi Wound Chart Details Patient Name: [name]. Date of Service: 03/30/2018 3:30 PM Medical Record Number: [id number] Patient Account Number: [id number] Date of Birth/Sex: [birth year] (47 y.o. M) Treating RN: Cornell Barman Primary Care Nara Paternoster: Einar Pheasant Other Clinician: Referring Dinorah Masullo: Einar Pheasant Treating Aina Rossbach/Extender: Tito Dine in Treatment: 6 Vital Signs Height(in): Pulse(bpm): 56 Weight(lbs): Blood Pressure(mmHg): 122/65 Body Mass Index(BMI): Temperature(F): 98.0 Respiratory Rate 18 (breaths/min): Photos: [N/A:N/A] Wound Location: Left Calcaneus N/A N/A Wounding  Event: Pressure Injury N/A N/A Primary Etiology: Pressure Ulcer N/A N/A Secondary Etiology: Diabetic Wound/Ulcer of the N/A N/A Lower Extremity Comorbid History: Type I Diabetes, History of N/A N/A pressure wounds, Paraplegia Date Acquired: 02/10/2018 N/A N/A Weeks of Treatment: 6 N/A N/A Wound Status: Open N/A N/A Measurements L x W x D 0.4x0.7x0.2 N/A N/A (cm) Area (cm) : 0.22 N/A N/A Volume (cm) : 0.044 N/A N/A % Reduction in Area: 66.70% N/A N/A % Reduction in Volume: 66.70% N/A N/A Classification: Category/Stage III N/A N/A Exudate Amount: Medium N/A N/A Exudate Type: Serosanguineous N/A N/A Exudate Color: red, brown N/A N/A Wound Margin: Distinct, outline attached N/A N/A Granulation Amount: Large (67-100%) N/A N/A Granulation Quality: Red N/A N/A Necrotic Amount: Small (1-33%) N/A N/A Exposed Structures: Fascia: No N/A N/A Fat Layer (Subcutaneous Tissue) Exposed: No Tendon: No Muscle: No Debenedetto, [name] ([id number]) Joint: No Bone: No Epithelialization: None N/A N/A Periwound Skin Texture: Excoriation: No N/A N/A Induration: No Callus: No Crepitus: No Rash: No Scarring: No Periwound Skin Moisture: No Abnormalities Noted N/A N/A Periwound Skin  Color: Atrophie Blanche: No N/A N/A Cyanosis: No Ecchymosis: No Erythema: No Hemosiderin Staining: No Mottled: No Pallor: No Rubor: No Temperature: No Abnormality N/A N/A Tenderness on Palpation: No N/A N/A Wound Preparation: Ulcer Cleansing: N/A N/A Rinsed/Irrigated with Saline Topical Anesthetic Applied: Other: lidocaine 4% Treatment Notes Wound #7 (Left Calcaneus) 1. Cleansed with: Clean wound with Normal Saline 3. Peri-wound Care: Skin Prep 4. Dressing Applied: Prisma Ag 5. Secondary Dressing Applied Bordered Foam Dressing Electronic Signature(s) Signed: 03/30/2018 5:40:08 PM By: Linton Ham MD Entered By: Linton Ham on 03/30/2018 17:35:55 [place], [name] ([id number]) -------------------------------------------------------------------------------- [place] Details Patient Name: [name]. Date of Service: 03/30/2018 3:30 PM Medical Record Number: [id number] Patient Account Number: [id number] Date of Birth/Sex: [birth year] (47 y.o. M) Treating RN: Cornell Barman Primary Care Zanden Colver: Einar Pheasant Other Clinician: Referring Etter Royall: Einar Pheasant Treating Vernona Peake/Extender: Tito Dine in Treatment: 6 Active Inactive ` Orientation to the Wound Care Program Nursing Diagnoses: Knowledge deficit related to the wound healing center program Goals: Patient/caregiver will verbalize understanding of the [facility] Program Date Initiated: 02/16/2018 Target Resolution Date: 03/19/2017 Goal Status: Active Interventions: Provide education on orientation to the wound center Notes: ` Pressure Nursing Diagnoses: Knowledge deficit related to management of pressures ulcers Goals: Patient/caregiver will verbalize risk factors for pressure ulcer development Date Initiated: 02/16/2018 Target Resolution Date: 03/19/2018 Goal Status: Active Interventions: Assess: immobility, friction, shearing, incontinence upon admission  and as needed Notes: ` Soft Tissue Infection Nursing Diagnoses: Impaired tissue integrity Potential for infection: soft tissue Goals: Patient will remain free of wound infection Date Initiated: 02/16/2018 Target Resolution Date: 03/19/2018 Goal Status: Active AMONTAE, NG ([id number]) Interventions: Assess signs and symptoms of infection every visit Notes: ` Wound/Skin Impairment Nursing Diagnoses: Impaired tissue integrity Goals: Ulcer/skin breakdown will heal within 14 weeks Date Initiated: 02/16/2018 Target Resolution Date: 06/18/2018 Goal Status: Active Interventions: Assess ulceration(s) every visit Treatment Activities: Topical wound management initiated : 02/16/2018 Notes: Electronic Signature(s) Signed: 03/30/2018 8:51:29 PM By: Gretta Cool, BSN, RN, CWS, Kim RN, BSN Entered By: Gretta Cool, BSN, RN, CWS, Kim on 03/30/2018 [place], [name] ([id number]) -------------------------------------------------------------------------------- Pain Assessment Details Patient Name: [name]. Date of Service: 03/30/2018 3:30 PM Medical Record Number: [id number] Patient Account Number: [id number] Date of Birth/Sex: 02-05-1971 (47 y.o. M) Treating RN: Roger Shelter Primary Care Aria Pickrell: Einar Pheasant Other Clinician: Referring Katherine Tout: Einar Pheasant Treating Keyly Baldonado/Extender: Ricard Dillon  Weeks in Treatment: 6 Active Problems Location of Pain Severity and Description of Pain Patient Has Paino Yes Site Locations Pain Location: Pain in Ulcers Duration of the Pain. Constant / Intermittento Intermittent Rate the pain. Current Pain Level: 2 Character of Pain Describe the Pain: Burning Pain Management and Medication Current Pain Management: Electronic Signature(s) Signed: 03/30/2018 4:28:16 PM By: Roger Shelter Entered By: Roger Shelter on 03/30/2018 15:41:37 [place], [name]  ([id number]) -------------------------------------------------------------------------------- Patient/Caregiver Education Details Patient Name: [name]. Date of Service: 03/30/2018 3:30 PM Medical Record Number: [id number] Patient Account Number: [id number] Date of Birth/Gender: [birth year] (47 y.o. M) Treating RN: Cornell Barman Primary Care Physician: Einar Pheasant Other Clinician: Referring Physician: Einar Pheasant Treating Physician/Extender: Tito Dine in Treatment: 6 Education Assessment Education Provided To: Patient Education Topics Provided Wound/Skin Impairment: Handouts: Other: dressings as prescribed Methods: Explain/Verbal Responses: State content correctly Electronic Signature(s) Signed: 03/30/2018 8:51:29 PM By: Gretta Cool, BSN, RN, CWS, Kim RN, BSN Entered By: Gretta Cool, BSN, RN, CWS, Kim on 03/30/2018 16:21:45 [place], [name] ([id number]) -------------------------------------------------------------------------------- Wound Assessment Details Patient Name: [name]. Date of Service: 03/30/2018 3:30 PM Medical Record Number: [id number] Patient Account Number: [id number] Date of Birth/Sex: Jun 18, 1971 (47 y.o. M) Treating RN: Roger Shelter Primary Care Thanya Cegielski: Einar Pheasant Other Clinician: Referring Temiloluwa Recchia: Einar Pheasant Treating Kassady Laboy/Extender: Tito Dine in Treatment: 6 Wound Status Wound Number: 7 Primary Etiology: Pressure Ulcer Wound Location: Left Calcaneus Secondary Diabetic Wound/Ulcer of the Lower Etiology: Extremity Wounding Event: Pressure Injury Wound Status: Open Date Acquired: 02/10/2018 Comorbid Type I Diabetes, History of pressure Weeks Of Treatment: 6 History: wounds, Paraplegia Clustered Wound: No Photos Photo Uploaded By: Roger Shelter on 03/30/2018 17:01:27 Wound Measurements Length: (cm) 0.4 Width: (cm) 0.7 Depth: (cm) 0.2 Area: (cm) 0.22 Volume: (cm) 0.044 % Reduction in Area:  66.7% % Reduction in Volume: 66.7% Epithelialization: None Tunneling: No Undermining: No Wound Description Classification: Category/Stage III Foul Wound Margin: Distinct, outline attached Slou Exudate Amount: Medium Exudate Type: Serosanguineous Exudate Color: red, brown Odor After Cleansing: No gh/Fibrino Yes Wound Bed Granulation Amount: Large (67-100%) Exposed Structure Granulation Quality: Red Fascia Exposed: No Necrotic Amount: Small (1-33%) Fat Layer (Subcutaneous Tissue) Exposed: No Necrotic Quality: Adherent Slough Tendon Exposed: No Muscle Exposed: No Joint Exposed: No Bone Exposed: No Periwound Skin Texture Guimond, [name] ([id number]) Texture Color No Abnormalities Noted: No No Abnormalities Noted: No Callus: No Atrophie Blanche: No Crepitus: No Cyanosis: No Excoriation: No Ecchymosis: No Induration: No Erythema: No Rash: No Hemosiderin Staining: No Scarring: No Mottled: No Pallor: No Moisture Rubor: No No Abnormalities Noted: No Temperature / Pain Temperature: No Abnormality Wound Preparation Ulcer Cleansing: Rinsed/Irrigated with Saline Topical Anesthetic Applied: Other: lidocaine 4%, Treatment Notes Wound #7 (Left Calcaneus) 1. Cleansed with: Clean wound with Normal Saline 3. Peri-wound Care: Skin Prep 4. Dressing Applied: Prisma Ag 5. Secondary Dressing Applied Bordered Foam Dressing Electronic Signature(s) Signed: 03/30/2018 4:28:16 PM By: Roger Shelter Entered By: Roger Shelter on 03/30/2018 15:46:54 [place], [name] ([id number]) -------------------------------------------------------------------------------- Vitals Details Patient Name: [name]. Date of Service: 03/30/2018 3:30 PM Medical Record Number: [id number] Patient Account Number: [id number] Date of Birth/Sex: 03-18-1971 (47 y.o. M) Treating RN: Roger Shelter Primary Care Leeland Lovelady: Einar Pheasant Other Clinician: Referring Shelene Krage: Einar Pheasant Treating Shyrl Obi/Extender: Tito Dine in Treatment: 6 Vital Signs Time Taken: 15:40 Temperature (F): 98.0 Pulse (bpm): 62 Respiratory Rate (breaths/min): 18 Blood Pressure (mmHg): 122/65 Reference Range: 80 - 120 mg / dl Electronic Signature(s) Signed: 03/30/2018 4:28:16  PM By: Roger Shelter Entered By: Roger Shelter on 03/30/2018 15:42:18
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
## 2018-04-13 ENCOUNTER — Encounter: Payer: Self-pay | Admitting: Internal Medicine

## 2018-04-13 ENCOUNTER — Encounter: Payer: Medicare Other | Attending: Internal Medicine | Admitting: Internal Medicine

## 2018-04-13 DIAGNOSIS — G8254 Quadriplegia, C5-C7 incomplete: Secondary | ICD-10-CM | POA: Insufficient documentation

## 2018-04-13 DIAGNOSIS — E10622 Type 1 diabetes mellitus with other skin ulcer: Secondary | ICD-10-CM | POA: Diagnosis not present

## 2018-04-13 DIAGNOSIS — R109 Unspecified abdominal pain: Secondary | ICD-10-CM | POA: Diagnosis not present

## 2018-04-13 DIAGNOSIS — L89623 Pressure ulcer of left heel, stage 3: Secondary | ICD-10-CM | POA: Diagnosis not present

## 2018-04-13 DIAGNOSIS — F329 Major depressive disorder, single episode, unspecified: Secondary | ICD-10-CM | POA: Insufficient documentation

## 2018-04-13 DIAGNOSIS — K921 Melena: Secondary | ICD-10-CM

## 2018-04-13 DIAGNOSIS — L97422 Non-pressure chronic ulcer of left heel and midfoot with fat layer exposed: Secondary | ICD-10-CM | POA: Diagnosis not present

## 2018-04-14 ENCOUNTER — Telehealth: Payer: Self-pay

## 2018-04-14 DIAGNOSIS — K921 Melena: Secondary | ICD-10-CM | POA: Diagnosis not present

## 2018-04-14 DIAGNOSIS — R1013 Epigastric pain: Secondary | ICD-10-CM | POA: Diagnosis not present

## 2018-04-14 NOTE — Telephone Encounter (Signed)
Please call pt and confirm that he is ok.  With stomach cramps and black stool does need to be evaluated.  I have placed order for referral to GI.  Please confirm if feel needs more urgent appt.  May need acute care or ER - pending symptoms.

## 2018-04-14 NOTE — Telephone Encounter (Signed)
Copied from [place] [phone]. Topic: Referral - Medical Records >> Apr 14, 2018  9:43 AM Synthia Innocent wrote: Reason for [facility] office notes, pt sch at 11am today. Fax to [phone]  Last office note has been faxed to [facility] at the number given above.

## 2018-04-15 NOTE — Telephone Encounter (Addendum)
Talked with patient , he is still having cramps in abdomen , but has followed up with GI is having Endoscopy on 04/19/18.

## 2018-04-15 NOTE — Telephone Encounter (Signed)
Discussed with Juliann Pulse.  Pt was seen yesterday and planning for EGD next week.  Does not need anything at this time.
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
## 2018-04-16 NOTE — Progress Notes (Signed)
CUTHBERT, TURTON ([id number]) Visit Report for 04/13/2018 Arrival Information Details Patient Name: [name], [name]. Date of Service: 04/13/2018 3:00 PM Medical Record Number: [id number] Patient Account Number: [id number] Date of Birth/Sex: 1971-02-28 (47 y.o. M) Treating RN: Roger Shelter Primary Care Avah Bashor: Einar Pheasant Other Clinician: Referring Frances Ambrosino: Einar Pheasant Treating Maxime Beckner/Extender: Tito Dine in Treatment: 8 Visit Information History Since Last Visit All ordered tests and consults were completed: No Patient Arrived: Wheel Chair Added or deleted any medications: No Arrival Time: 15:11 Any new allergies or adverse reactions: No Accompanied By: self Had a fall or experienced change in No Transfer Assistance: None activities of daily living that may affect Patient Identification Verified: Yes risk of falls: Secondary Verification Process Completed: Yes Signs or symptoms of abuse/neglect since last visito No Patient Requires Transmission-Based No Hospitalized since last visit: No Precautions: Implantable device outside of the clinic excluding No Patient Has Alerts: Yes cellular tissue based products placed in the center Patient Alerts: Type I since last visit: Diabetic Pain Present Now: No Electronic Signature(s) Signed: 04/13/2018 4:46:18 PM By: Roger Shelter Entered By: Roger Shelter on 04/13/2018 15:11:35 Barfuss, [name] ([id number]) -------------------------------------------------------------------------------- Clinic Level of Care Assessment Details Patient Name: [name]. Date of Service: 04/13/2018 3:00 PM Medical Record Number: [id number] Patient Account Number: [id number] Date of Birth/Sex: 11-18-1971 (47 y.o. M) Treating RN: Cornell Barman Primary Care Hazim Treadway: Einar Pheasant Other Clinician: Referring Dynastie Knoop: Einar Pheasant Treating Semira Stoltzfus/Extender: Tito Dine in Treatment: 8 Clinic Level of Care  Assessment Items TOOL 4 Quantity Score []  - Use when only an EandM is performed on FOLLOW-UP visit 0 ASSESSMENTS - Nursing Assessment / Reassessment []  - Reassessment of Co-morbidities (includes updates in patient status) 0 X- 1 5 Reassessment of Adherence to Treatment Plan ASSESSMENTS - Wound and Skin Assessment / Reassessment X - Simple Wound Assessment / Reassessment - one wound 1 5 []  - 0 Complex Wound Assessment / Reassessment - multiple wounds []  - 0 Dermatologic / Skin Assessment (not related to wound area) ASSESSMENTS - Focused Assessment []  - Circumferential Edema Measurements - multi extremities 0 []  - 0 Nutritional Assessment / Counseling / Intervention []  - 0 Lower Extremity Assessment (monofilament, tuning fork, pulses) []  - 0 Peripheral Arterial Disease Assessment (using hand held doppler) ASSESSMENTS - Ostomy and/or Continence Assessment and Care []  - Incontinence Assessment and Management 0 []  - 0 Ostomy Care Assessment and Management (repouching, etc.) PROCESS - Coordination of Care X - Simple Patient / Family Education for ongoing care 1 15 []  - 0 Complex (extensive) Patient / Family Education for ongoing care X- 1 10 Staff obtains Programmer, systems, Records, Test Results / Process Orders []  - 0 Staff telephones HHA, Nursing Homes / Clarify orders / etc []  - 0 Routine Transfer to another Facility (non-emergent condition) []  - 0 Routine Hospital Admission (non-emergent condition) []  - 0 New Admissions / Biomedical engineer / Ordering NPWT, Apligraf, etc. []  - 0 Emergency Hospital Admission (emergent condition) X- 1 10 Simple Discharge Coordination Radcliffe, [name] ([id number]) []  - 0 Complex (extensive) Discharge Coordination PROCESS - Special Needs []  - Pediatric / Minor Patient Management 0 []  - 0 Isolation Patient Management []  - 0 Hearing / Language / Visual special needs []  - 0 Assessment of Community assistance (transportation, D/C planning,  etc.) []  - 0 Additional assistance / Altered mentation []  - 0 Support Surface(s) Assessment (bed, cushion, seat, etc.) INTERVENTIONS - Wound Cleansing / Measurement X - Simple Wound Cleansing - one wound 1  5 []  - 0 Complex Wound Cleansing - multiple wounds X- 1 5 Wound Imaging (photographs - any number of wounds) []  - 0 Wound Tracing (instead of photographs) X- 1 5 Simple Wound Measurement - one wound []  - 0 Complex Wound Measurement - multiple wounds INTERVENTIONS - Wound Dressings []  - Small Wound Dressing one or multiple wounds 0 X- 1 15 Medium Wound Dressing one or multiple wounds []  - 0 Large Wound Dressing one or multiple wounds []  - 0 Application of Medications - topical []  - 0 Application of Medications - injection INTERVENTIONS - Miscellaneous []  - External ear exam 0 []  - 0 Specimen Collection (cultures, biopsies, blood, body fluids, etc.) []  - 0 Specimen(s) / Culture(s) sent or taken to Lab for analysis []  - 0 Patient Transfer (multiple staff / Civil Service fast streamer / Similar devices) []  - 0 Simple Staple / Suture removal (25 or less) []  - 0 Complex Staple / Suture removal (26 or more) []  - 0 Hypo / Hyperglycemic Management (close monitor of Blood Glucose) []  - 0 Ankle / Brachial Index (ABI) - do not check if billed separately X- 1 5 Vital Signs Matin, [name] ([id number]) Has the patient been seen at the hospital within the last three years: Yes Total Score: 80 Level Of Care: New/Established - Level 3 Electronic Signature(s) Signed: 04/13/2018 5:47:31 PM By: Gretta Cool, BSN, RN, CWS, Kim RN, BSN Entered By: Gretta Cool, BSN, RN, CWS, Kim on 04/13/2018 15:35:49 Coleson, [name] ([id number]) -------------------------------------------------------------------------------- Lower Extremity Assessment Details Patient Name: [name]. Date of Service: 04/13/2018 3:00 PM Medical Record Number: [id number] Patient Account Number: [id number] Date of Birth/Sex: Jul 22, 1971 (47 y.o.  M) Treating RN: Roger Shelter Primary Care Lunell Robart: Einar Pheasant Other Clinician: Referring Yareth Kearse: Einar Pheasant Treating Aline Wesche/Extender: Tito Dine in Treatment: 8 Edema Assessment Assessed: [Left: No] [Right: No] Edema: [Left: N] [Right: o] Vascular Assessment Claudication: Claudication Assessment [Left:None] Pulses: Dorsalis Pedis Palpable: [Left:Yes] Posterior Tibial Extremity colors, hair growth, and conditions: Extremity Color: [Left:Mottled] Hair Growth on Extremity: [Left:Yes] Temperature of Extremity: [Left:Cool] Capillary Refill: [Left:< 3 seconds] Toe Nail Assessment Left: Right: Thick: No Discolored: No Deformed: No Improper Length and Hygiene: No Electronic Signature(s) Signed: 04/13/2018 4:46:18 PM By: Roger Shelter Entered By: Roger Shelter on 04/13/2018 15:22:15 Darr, [name] ([id number]) -------------------------------------------------------------------------------- Multi Wound Chart Details Patient Name: [name]. Date of Service: 04/13/2018 3:00 PM Medical Record Number: [id number] Patient Account Number: [id number] Date of Birth/Sex: 1971/03/13 (47 y.o. 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] [N/A:N/A] Comorbid History: [7:Type I Diabetes, History of pressure wounds, Paraplegia] [N/A:N/A] Date Acquired: [7:02/10/2018] [N/A:N/A] Weeks of Treatment: [7:8]  [N/A:N/A] Wound Status: [7:Open] [N/A:N/A] Measurements L x W x D [7:0.4x0.7x0.4] [N/A:N/A] (cm) Area (cm) : [7:0.22] [N/A:N/A] Volume (cm) : [7:0.088] [N/A:N/A] % Reduction in Area: [7:66.70%] [N/A:N/A] % Reduction in Volume: [7:33.30%] [N/A:N/A] Classification: [7:Category/Stage III] [N/A:N/A] Exudate Amount: [7:Medium] [N/A:N/A] Exudate Type: [7:Serous] [N/A:N/A] Exudate Color: [7:amber] [N/A:N/A] Wound Margin: [7:Distinct, outline attached] [N/A:N/A] Granulation Amount: [7:Large (67-100%)] [N/A:N/A] Granulation Quality: [7:Red] [N/A:N/A] Necrotic Amount: [7:Small (1-33%)] [N/A:N/A] Exposed Structures: [7:Fascia: No Fat Layer (Subcutaneous Tissue) Exposed: No Tendon: No Muscle: No  Joint: No Bone: No] [N/A:N/A] Epithelialization: [7:None] [N/A:N/A] Periwound Skin Texture: [7:Excoriation: No Induration: No Callus: No Crepitus: No] [N/A:N/A] Rash: No Scarring: No Periwound Skin Moisture: No Abnormalities Noted N/A N/A Periwound Skin Color: Atrophie Blanche: No N/A N/A Cyanosis: No Ecchymosis: No Erythema: No Hemosiderin Staining: No Mottled: No Pallor: No Rubor: No Temperature: No Abnormality N/A N/A Tenderness on Palpation: Yes N/A N/A Wound Preparation: Ulcer Cleansing: N/A N/A Rinsed/Irrigated with Saline Topical Anesthetic Applied: Other: lidocaine 4% Treatment Notes Electronic Signature(s) Signed: 04/14/2018 1:16:01 PM By: Linton Ham MD Entered By: Linton Ham on 04/13/2018 17:02:34 Schweppe, [name] ([id number]) -------------------------------------------------------------------------------- [place] Details Patient Name: [name]. Date of Service: 04/13/2018 3:00 PM Medical Record Number: [id number] Patient Account Number: [id number] Date of Birth/Sex: 10/18/1971 (47 y.o. M) Treating RN: Cornell Barman Primary Care Jourden Gilson: Einar Pheasant Other Clinician: Referring Caidon Foti: Einar Pheasant Treating Marylynne Keelin/Extender: Tito Dine in Treatment: 8 Active Inactive ` Orientation to the Wound Care Program Nursing Diagnoses: Knowledge deficit related to the wound healing center program Goals: Patient/caregiver will verbalize understanding of the [place] Program Date Initiated: 02/16/2018 Target Resolution Date: 03/19/2017 Goal Status: Active Interventions: Provide education on orientation to the wound center Notes: ` Pressure Nursing Diagnoses: Knowledge deficit related to management of pressures ulcers Goals: Patient/caregiver will verbalize risk factors for pressure ulcer development Date Initiated: 02/16/2018 Target Resolution Date: 03/19/2018 Goal Status: Active Interventions: Assess: immobility, friction, shearing, incontinence upon admission and as needed Notes: ` Soft Tissue Infection Nursing Diagnoses: Impaired tissue integrity Potential for infection: soft tissue Goals: Patient will remain free of wound infection Date Initiated: 02/16/2018 Target Resolution Date: 03/19/2018 Goal Status: Active KLARK, VANDERHOEF ([id number]) Interventions: Assess signs and symptoms of infection every visit Notes: ` Wound/Skin Impairment Nursing Diagnoses: Impaired tissue integrity Goals: Ulcer/skin breakdown will heal within 14 weeks Date Initiated: 02/16/2018 Target Resolution Date: 06/18/2018 Goal Status: Active Interventions: Assess ulceration(s) every visit Treatment Activities: Topical wound management initiated : 02/16/2018 Notes: Electronic Signature(s) Signed: 04/13/2018 5:47:31 PM By: Gretta Cool, BSN, RN, CWS, Kim RN, BSN Entered By: Gretta Cool, BSN, RN, CWS, Kim on 04/13/2018 15:30:24 Brinkman, [name] ([id number]) -------------------------------------------------------------------------------- Pain Assessment Details Patient Name: [name]. Date of Service: 04/13/2018 3:00 PM Medical Record Number: [id number] Patient Account Number: [id number] Date of Birth/Sex: [birth year]  (47 y.o. M) Treating RN: Roger Shelter Primary Care Jaryn Hocutt: Einar Pheasant Other Clinician: Referring Charlen Bakula: Einar Pheasant Treating Joselito Fieldhouse/Extender: Tito Dine in Treatment: 8 Active Problems Location of Pain Severity and Description of Pain Patient Has Paino No Site Locations Pain Management and Medication Current Pain Management: Electronic Signature(s) Signed: 04/13/2018 4:46:18 PM By: Roger Shelter Entered By: Roger Shelter on 04/13/2018 15:11:44 Goytia, [name] ([id number]) -------------------------------------------------------------------------------- Wound Assessment Details Patient Name: [name]. Date of Service: 04/13/2018 3:00 PM Medical Record Number: [id number] Patient Account Number: [id number] Date of Birth/Sex: [birth year] (47 y.o. M) Treating RN: Roger Shelter Primary Care Burke Terry: Einar Pheasant Other Clinician: Referring Wayne Brunker: Einar Pheasant Treating Maximilien Hayashi/Extender: Tito Dine in Treatment: 8 Wound Status Wound Number: 7 Primary Etiology: Pressure Ulcer Wound Location: Left Calcaneus Secondary Diabetic Wound/Ulcer of the Lower Etiology: Extremity Wounding Event: Pressure Injury Wound Status: Open Date Acquired: 02/10/2018 Comorbid Type I Diabetes, History of pressure Weeks Of Treatment: 8 History: wounds, Paraplegia Clustered Wound: No Photos Photo Uploaded By: Gretta Cool, BSN, RN, CWS, Kim on 04/14/2018 15:54:29 Wound Measurements Length: (cm) 0.4 % Reducti Width: (cm) 0.7 % Reducti Depth: (cm) 0.4 Epithelia Area: (cm) 0.22  Tunnelin Volume: (cm) 0.088 Undermin on in Area: 66.7% on in Volume: 33.3% lization: None g: No ing: No Wound Description Classification: Category/Stage III Foul Odo Wound Margin: Distinct, outline attached Slough/F Exudate Amount: Medium Exudate Type: Serous Exudate Color: amber r After Cleansing: No ibrino Yes Wound Bed Granulation Amount: Large (67-100%)  Exposed Structure Granulation Quality: Red Fascia Exposed: No Necrotic Amount: Small (1-33%) Fat Layer (Subcutaneous Tissue) Exposed: No Necrotic Quality: Adherent Slough Tendon Exposed: No Muscle Exposed: No Joint Exposed: No Bone Exposed: No Periwound Skin Texture Greeley, [name] ([id number]) Texture Color No Abnormalities Noted: No No Abnormalities Noted: No Callus: No Atrophie Blanche: No Crepitus: No Cyanosis: No Excoriation: No Ecchymosis: No Induration: No Erythema: No Rash: No Hemosiderin Staining: No Scarring: No Mottled: No Pallor: No Moisture Rubor: No No Abnormalities Noted: No Temperature / Pain Temperature: No Abnormality Tenderness on Palpation: Yes Wound Preparation Ulcer Cleansing: Rinsed/Irrigated with Saline Topical Anesthetic Applied: Other: lidocaine 4%, Electronic Signature(s) Signed: 04/13/2018 4:46:18 PM By: Roger Shelter Signed: 04/13/2018 5:47:31 PM By: Gretta Cool, BSN, RN, CWS, Kim RN, BSN Entered By: Gretta Cool, BSN, RN, CWS, Kim on 04/13/2018 15:34:28 Mcglown, [name] ([id number]) -------------------------------------------------------------------------------- Vitals Details Patient Name: [name]. Date of Service: 04/13/2018 3:00 PM Medical Record Number: [id number] Patient Account Number: [id number] Date of Birth/Sex: 1971-08-04 (47 y.o. M) Treating RN: Roger Shelter Primary Care Birdell Frasier: Einar Pheasant Other Clinician: Referring Nilay Mangrum: Einar Pheasant Treating Jacinta Penalver/Extender: Tito Dine in Treatment: 8 Vital Signs Time Taken: 15:12 Temperature (F): 98.2 Pulse (bpm): 59 Respiratory Rate (breaths/min): 18 Blood Pressure (mmHg): 140/95 Reference Range: 80 - 120 mg / dl Electronic Signature(s) Signed: 04/13/2018 4:46:18 PM By: Roger Shelter Entered By: Roger Shelter on 04/13/2018 15:12:24

## 2018-04-18 ENCOUNTER — Encounter: Payer: Self-pay | Admitting: *Deleted

## 2018-04-19 ENCOUNTER — Ambulatory Visit: Payer: Medicare Other | Admitting: Certified Registered"

## 2018-04-19 ENCOUNTER — Encounter
Admission: RE | Disposition: A | Discharge status: Home / Self Care | Payer: Self-pay | Source: Ambulatory Visit | Attending: Internal Medicine

## 2018-04-19 ENCOUNTER — Telehealth: Payer: Self-pay | Admitting: *Deleted

## 2018-04-19 ENCOUNTER — Encounter: Payer: Self-pay | Admitting: *Deleted

## 2018-04-19 ENCOUNTER — Ambulatory Visit
Admission: RE | Admit: 2018-04-19 | Discharge: 2018-04-19 | Disposition: A | Discharge status: Home / Self Care | Payer: Medicare Other | Source: Ambulatory Visit | Attending: Internal Medicine | Admitting: Internal Medicine

## 2018-04-19 DIAGNOSIS — Z87442 Personal history of urinary calculi: Secondary | ICD-10-CM | POA: Insufficient documentation

## 2018-04-19 DIAGNOSIS — G8254 Quadriplegia, C5-C7 incomplete: Secondary | ICD-10-CM | POA: Insufficient documentation

## 2018-04-19 DIAGNOSIS — E109 Type 1 diabetes mellitus without complications: Secondary | ICD-10-CM | POA: Insufficient documentation

## 2018-04-19 DIAGNOSIS — Z87440 Personal history of urinary (tract) infections: Secondary | ICD-10-CM | POA: Diagnosis not present

## 2018-04-19 DIAGNOSIS — K5790 Diverticulosis of intestine, part unspecified, without perforation or abscess without bleeding: Secondary | ICD-10-CM | POA: Diagnosis not present

## 2018-04-19 DIAGNOSIS — Z794 Long term (current) use of insulin: Secondary | ICD-10-CM | POA: Diagnosis not present

## 2018-04-19 DIAGNOSIS — Z981 Arthrodesis status: Secondary | ICD-10-CM | POA: Diagnosis not present

## 2018-04-19 DIAGNOSIS — K210 Gastro-esophageal reflux disease with esophagitis: Secondary | ICD-10-CM | POA: Diagnosis not present

## 2018-04-19 DIAGNOSIS — K921 Melena: Secondary | ICD-10-CM | POA: Diagnosis not present

## 2018-04-19 DIAGNOSIS — R32 Unspecified urinary incontinence: Secondary | ICD-10-CM | POA: Insufficient documentation

## 2018-04-19 DIAGNOSIS — F329 Major depressive disorder, single episode, unspecified: Secondary | ICD-10-CM | POA: Insufficient documentation

## 2018-04-19 DIAGNOSIS — K2950 Unspecified chronic gastritis without bleeding: Secondary | ICD-10-CM | POA: Insufficient documentation

## 2018-04-19 DIAGNOSIS — N319 Neuromuscular dysfunction of bladder, unspecified: Secondary | ICD-10-CM | POA: Diagnosis not present

## 2018-04-19 DIAGNOSIS — K2960 Other gastritis without bleeding: Secondary | ICD-10-CM | POA: Diagnosis not present

## 2018-04-19 DIAGNOSIS — K449 Diaphragmatic hernia without obstruction or gangrene: Secondary | ICD-10-CM | POA: Insufficient documentation

## 2018-04-19 DIAGNOSIS — K2970 Gastritis, unspecified, without bleeding: Secondary | ICD-10-CM | POA: Diagnosis not present

## 2018-04-19 DIAGNOSIS — Z79899 Other long term (current) drug therapy: Secondary | ICD-10-CM | POA: Diagnosis not present

## 2018-04-19 DIAGNOSIS — K635 Polyp of colon: Secondary | ICD-10-CM | POA: Diagnosis not present

## 2018-04-19 DIAGNOSIS — K648 Other hemorrhoids: Secondary | ICD-10-CM | POA: Diagnosis not present

## 2018-04-19 DIAGNOSIS — K228 Other specified diseases of esophagus: Secondary | ICD-10-CM | POA: Diagnosis not present

## 2018-04-19 DIAGNOSIS — K922 Gastrointestinal hemorrhage, unspecified: Secondary | ICD-10-CM | POA: Diagnosis present

## 2018-04-19 HISTORY — PX: ESOPHAGOGASTRODUODENOSCOPY (EGD) WITH PROPOFOL: SHX5813

## 2018-04-19 LAB — GLUCOSE, CAPILLARY: Glucose-Capillary: 183 mg/dL — ABNORMAL HIGH (ref 65–99)

## 2018-04-19 SURGERY — ESOPHAGOGASTRODUODENOSCOPY (EGD) WITH PROPOFOL
Anesthesia: General

## 2018-04-19 MED ORDER — GLYCOPYRROLATE 0.2 MG/ML IJ SOLN
INTRAMUSCULAR | Status: AC
  Filled 2018-04-19: qty 1

## 2018-04-19 MED ORDER — LIDOCAINE HCL (CARDIAC) PF 100 MG/5ML IV SOSY
PREFILLED_SYRINGE | INTRAVENOUS | Status: DC | PRN
  Administered 2018-04-19: 50 mg via INTRAVENOUS

## 2018-04-19 MED ORDER — PROPOFOL 10 MG/ML IV BOLUS
INTRAVENOUS | Status: AC
  Filled 2018-04-19: qty 20

## 2018-04-19 MED ORDER — HYDRALAZINE HCL 20 MG/ML IJ SOLN
INTRAMUSCULAR | Status: DC | PRN
  Administered 2018-04-19: 4 mg via INTRAVENOUS

## 2018-04-19 MED ORDER — PROPOFOL 10 MG/ML IV BOLUS
INTRAVENOUS | Status: DC | PRN
  Administered 2018-04-19: 50 mg via INTRAVENOUS
  Administered 2018-04-19: 150 mg via INTRAVENOUS
  Administered 2018-04-19: 50 mg via INTRAVENOUS

## 2018-04-19 MED ORDER — SODIUM CHLORIDE 0.9 % IV SOLN
INTRAVENOUS | Status: DC
  Administered 2018-04-19: 1000 mL via INTRAVENOUS

## 2018-04-19 MED ORDER — LIDOCAINE HCL (PF) 2 % IJ SOLN
INTRAMUSCULAR | Status: AC
  Filled 2018-04-19: qty 10

## 2018-04-19 MED ORDER — LIDOCAINE HCL (PF) 1 % IJ SOLN
INTRAMUSCULAR | Status: AC
  Administered 2018-04-19: 0.3 mL
  Filled 2018-04-19: qty 2

## 2018-04-19 MED ORDER — GLYCOPYRROLATE 0.2 MG/ML IJ SOLN
INTRAMUSCULAR | Status: DC | PRN
  Administered 2018-04-19 (×2): 0.1 mg via INTRAVENOUS

## 2018-04-19 MED ORDER — HYDRALAZINE HCL 20 MG/ML IJ SOLN
INTRAMUSCULAR | Status: AC
Start: 2018-04-19 — End: ?
  Filled 2018-04-19: qty 1

## 2018-04-19 MED ORDER — PROPOFOL 500 MG/50ML IV EMUL
INTRAVENOUS | Status: AC
  Filled 2018-04-19: qty 50

## 2018-04-19 NOTE — Transfer of Care (Signed)
Immediate Anesthesia Transfer of Care Note  Patient: [name]  Procedure(s) Performed: ESOPHAGOGASTRODUODENOSCOPY (EGD) WITH PROPOFOL (N/A )  Patient Location: PACU and Endoscopy Unit  Anesthesia Type:General  Level of Consciousness: awake, alert , oriented and patient cooperative  Airway & Oxygen Therapy: Patient Spontanous Breathing  Post-op Assessment: Report given to RN, Post -op Vital signs reviewed and stable and Patient moving all extremities  Post vital signs: Reviewed and stable  Last Vitals:  Vitals Value Taken Time  BP 88/54 04/19/2018 11:30 AM  Temp 36.1 C 04/19/2018 11:30 AM  Pulse 65 04/19/2018 11:31 AM  Resp 21 04/19/2018 11:31 AM  SpO2 99 % 04/19/2018 11:31 AM  Vitals shown include unvalidated device data.  Last Pain:  Vitals:   04/19/18 1120  TempSrc: Tympanic  PainSc:       Patients Stated Pain Goal: 0 (09/32/67 1245)  Complications: No apparent anesthesia complications

## 2018-04-19 NOTE — Anesthesia Post-op Follow-up Note (Signed)
Anesthesia QCDR form completed.        

## 2018-04-19 NOTE — Anesthesia Preprocedure Evaluation (Signed)
Anesthesia Evaluation  Patient identified by MRN, date of birth, ID band Patient awake    Reviewed: Allergy & Precautions, H&P , NPO status , Patient's Chart, lab work & pertinent test results, reviewed documented beta blocker date and time   Airway Mallampati: II  TM Distance: >3 FB Neck ROM: full    Dental  (+) Dental Advidsory Given, Teeth Intact   Pulmonary neg pulmonary ROS,           Cardiovascular Exercise Tolerance: Good negative cardio ROS       Neuro/Psych neg Seizures PSYCHIATRIC DISORDERS Depression C6/C7 parapalegic    GI/Hepatic negative GI ROS, Neg liver ROS,   Endo/Other  diabetes  Renal/GU Renal disease (kidney stones)  negative genitourinary   Musculoskeletal   Abdominal   Peds  Hematology negative hematology ROS (+)   Anesthesia Other Findings Past Medical History: No date: Chronic pain     Comment:  secondary 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 No date: Depression No date: Fainting episodes No date: History of frequent urinary tract infections     Comment:  neurogenic bladder No date: History of kidney stones No date: Low blood pressure No date: Nephrolithiasis     Comment:  STONES No date: Quadriplegia, C5-C7, incomplete (HCC)     Comment:  C7 s/p cervical fusion (secondary to [place]) No date: Type 1 diabetes mellitus (HCC)     Comment:  type 1 No date: Urinary tract bacterial infections No date: Urine incontinence   Reproductive/Obstetrics negative OB ROS                             Anesthesia Physical Anesthesia Plan  ASA: II  Anesthesia Plan: General   Post-op Pain Management:    Induction: Intravenous  PONV Risk Score and Plan: 2 and Propofol infusion  Airway Management Planned: Nasal Cannula  Additional Equipment:   Intra-op Plan:   Post-operative Plan:   Informed Consent: I have reviewed the patients History and Physical, chart,  labs and discussed the procedure including the risks, benefits and alternatives for the proposed anesthesia with the patient or authorized representative who has indicated his/her understanding and acceptance.   Dental Advisory Given  Plan Discussed with: Anesthesiologist, CRNA and Surgeon  Anesthesia Plan Comments:         Anesthesia Quick Evaluation

## 2018-04-19 NOTE — Op Note (Signed)
[facility] Patient Name: [name] Procedure Date: 04/19/2018 10:46 AM MRN: [id number] Account #: [id number] Date of Birth: 1971-06-28 Admit Type: Outpatient Age: 47 Room: [facility] ENDO ROOM 2 Gender: Male Note Status: Finalized Procedure:            Upper GI endoscopy Indications:          Recent gastrointestinal bleeding, Suspected upper                        gastrointestinal bleeding Providers:            Benay Pike. Alice Reichert MD, MD Referring MD:         Einar Pheasant, MD (Referring MD) Medicines:            Propofol per Anesthesia Complications:        No immediate complications. Procedure:            Pre-Anesthesia Assessment:                       - The risks and benefits of the procedure and the                        sedation options and risks were discussed with the                        patient. All questions were answered and informed                        consent was obtained.                       - Patient identification and proposed procedure were                        verified prior to the procedure by the nurse. The                        procedure was verified in the procedure room.                       - Patient identification and proposed procedure were                        verified prior to the procedure.                       - ASA Grade Assessment: III - A patient with severe                        systemic disease.                       - After reviewing the risks and benefits, the patient                        was deemed in satisfactory condition to undergo the                        procedure.                       After obtaining informed  consent, the endoscope was                        passed under direct vision. Throughout the procedure,                        the patient's blood pressure, pulse, and oxygen                        saturations were monitored continuously. The Endoscope                        was  introduced through the mouth, and advanced to the                        third part of duodenum. The upper GI endoscopy was                        accomplished without difficulty. The patient tolerated                        the procedure well. Findings:      The Z-line was irregular and was found in the distal esophagus. Mucosa       was biopsied with a cold forceps for histology [Pattern] [Intervals]       [Site]. Mucosa was biopsied with a cold forceps for histology. One       specimen bottle was sent to pathology.      A small hiatal hernia was present.      Localized mildly erythematous mucosa without bleeding was found in the       gastric antrum. Biopsies were taken with a cold forceps for Helicobacter       pylori testing.      There is no endoscopic evidence of bleeding, mucosal abnormalities,       ulceration, varices or erosion in the stomach.      The examined duodenum was normal. Impression:           - Z-line irregular, in the distal esophagus. Biopsied.                       - Small hiatal hernia.                       - Erythematous mucosa in the antrum. Biopsied.                       - Normal examined duodenum. Recommendation:       - Patient has a contact number available for                        emergencies. The signs and symptoms of potential                        delayed complications were discussed with the patient.                        Return to normal activities tomorrow. Written discharge                        instructions were provided to the patient.                       -  Resume previous diet.                       - Continue present medications.                       - Perform a colonoscopy in 2 weeks.                       - The findings and recommendations were discussed with                        the patient and their family. Procedure Code(s):    --- Professional ---                       [phone], Esophagogastroduodenoscopy, flexible, transoral;                         with biopsy, single or multiple Diagnosis Code(s):    --- Professional ---                       K92.2, Gastrointestinal hemorrhage, unspecified                       K31.89, Other diseases of stomach and duodenum                       K44.9, Diaphragmatic hernia without obstruction or                        gangrene                       K22.8, Other specified diseases of esophagus CPT copyright 2017 [employer]. All rights reserved. The codes documented in this report are preliminary and upon coder review may  be revised to meet current compliance requirements. Efrain Sella MD, MD 04/19/2018 11:28:44 AM This report has been signed electronically. Number of Addenda: 0 Note Initiated On: 04/19/2018 10:46 AM      [facility]

## 2018-04-19 NOTE — H&P (Signed)
Outpatient short stay form Pre-procedure 04/19/2018 9:46 AM [name], M.D.  Primary Physician: Einar Pheasant, M.D.  Reason for visit:  melena  History of present illness: 47 year old male with a history of paraplegia presents for intermittent episodes of melena.  He does take ibuprofen 200 mg every 4-6 hours.  Is a type I diabetic.  Last hemoglobin A1c was7.3 on 04/30/2018.   No current facility-administered medications for this encounter.   Medications Prior to Admission  Medication Sig Dispense Refill Last Dose  . Ascorbic Acid (VITAMIN C) 1000 MG tablet Take 1,000 mg by mouth 2 (two) times daily.   Taking  . B-D UF III MINI PEN NEEDLES 31G X 5 MM MISC AS DIRECTED. 100 each 0 Taking  . baclofen (LIORESAL) 20 MG tablet TAKE TWO TABLETS THREE TIMES A DAY. 180 tablet 0   . hydrocortisone (ANUSOL-HC) 25 MG suppository Place 1 suppository (25 mg total) rectally 2 (two) times daily. 14 suppository 0 Taking  . ibuprofen (ADVIL,MOTRIN) 200 MG tablet Take 400-600 mg by mouth every 6 (six) hours as needed (for pain.).   Taking  . insulin detemir (LEVEMIR) 100 UNIT/ML injection Inject 29 units at bedtime (Patient not taking: Reported on 03/29/2018) 10 mL 0 Not Taking  . insulin glargine (LANTUS) 100 UNIT/ML injection Lantus U-100 Insulin 100 unit/mL subcutaneous solution   Taking  . insulin lispro (HUMALOG KWIKPEN) 100 UNIT/ML KiwkPen Take as needed Dx: E10.9 15 mL 11 Taking  . methenamine (MANDELAMINE) 1 G tablet Take 1,000 mg by mouth 2 (two) times daily.   Taking  . Multiple Vitamin (MULTIVITAMIN) tablet Take 1 tablet by mouth daily.   Taking  . Multiple Vitamins-Minerals (ZINC PO) Take by mouth.   Taking  . naphazoline-glycerin (CLEAR EYES) 0.012-0.2 % SOLN Place 1-2 drops into both eyes 4 (four) times daily as needed for irritation.   Taking  . vitamin A 10000 UNIT capsule Take 10,000 Units by mouth daily.   Not Taking     Allergies  Allergen Reactions  . Acetaminophen   .  Decongestant [Pseudoephedrine Hcl] Other (See Comments)    Cause UTIs  . Ivp Dye [Iodinated Diagnostic Agents] Other (See Comments)    Urticaria   . Sulfa Antibiotics Other (See Comments)    Tongue swells     Past Medical History:  Diagnosis Date  . 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 Depression   . Fainting episodes   . History of frequent urinary tract infections    neurogenic bladder  . History of kidney stones   . Low blood pressure   . Nephrolithiasis    STONES  . Quadriplegia, C5-C7, incomplete ([place])    C7 s/p cervical fusion (secondary to [place])  . Type 1 diabetes mellitus (HCC)    type 1  . Urinary tract bacterial infections   . Urine incontinence     Review of systems:   Otherwise negative.   Physical Exam  Gen: Alert, oriented. Appears stated age.  HEENT: [place]/AT. PERRLA. Lungs: CTA, no wheezes. CV: RR nl S1, S2. Abd: soft, benign, no masses. BS+ Ext: No edema. Pulses 2+    Planned procedures: Proceed with EGD.The patient understands the nature of the planned procedure, indications, risks, alternatives and potential complications including but not limited to bleeding, infection, perforation, damage to internal organs and possible oversedation/side effects from anesthesia. The patient agrees and gives consent to proceed.  Please refer to procedure notes for findings, recommendations  and patient disposition/instructions.    [name], M.D. Gastroenterology 04/19/2018  9:46 AM

## 2018-04-19 NOTE — Telephone Encounter (Signed)
Copied from [place] [id number]. Topic: Inquiry >> Apr 19, 2018 12:41 PM Oliver Pila B wrote: Reason for CRM: Dr. Allen Norris called about the pt and asked to be called back to discuss, contact [phone]

## 2018-04-19 NOTE — Telephone Encounter (Signed)
Called.

## 2018-04-19 NOTE — Anesthesia Postprocedure Evaluation (Signed)
Anesthesia Post Note  Patient: [name]  Procedure(s) Performed: ESOPHAGOGASTRODUODENOSCOPY (EGD) WITH PROPOFOL (N/A )  Patient location during evaluation: Endoscopy Anesthesia Type: General Level of consciousness: awake and alert, oriented and patient cooperative Pain management: satisfactory to patient Vital Signs Assessment: post-procedure vital signs reviewed and stable Respiratory status: spontaneous breathing and respiratory function stable Cardiovascular status: blood pressure returned to baseline and stable Postop Assessment: no headache, no backache, no apparent nausea or vomiting, adequate PO intake and able to ambulate Anesthetic complications: no Comments: Pt paraplegic     Last Vitals:  Vitals:   04/19/18 1120 04/19/18 1130  BP:  (!) 88/54  Pulse:  67  Resp:  11  Temp: (!) 36.1 C (!) 36.1 C  SpO2:  98%    Last Pain:  Vitals:   04/19/18 1120  TempSrc: Tympanic  PainSc:                  Clovis Riley Dejah Droessler

## 2018-04-19 NOTE — Interval H&P Note (Signed)
History and Physical Interval Note:  04/19/2018 9:48 AM  [name]  has presented today for surgery, with the diagnosis of MELENA ABD PAIN  The various methods of treatment have been discussed with the patient and family. After consideration of risks, benefits and other options for treatment, the patient has consented to  Procedure(s): ESOPHAGOGASTRODUODENOSCOPY (EGD) WITH PROPOFOL (N/A) as a surgical intervention .  The patient's history has been reviewed, patient examined, no change in status, stable for surgery.  I have reviewed the patient's chart and labs.  Questions were answered to the patient's satisfaction.     [place], [place]

## 2018-04-20 ENCOUNTER — Encounter: Payer: Self-pay | Admitting: Internal Medicine

## 2018-04-20 LAB — SURGICAL PATHOLOGY

## 2018-04-27 ENCOUNTER — Encounter: Payer: Medicare Other | Admitting: Internal Medicine

## 2018-04-27 DIAGNOSIS — L89623 Pressure ulcer of left heel, stage 3: Secondary | ICD-10-CM | POA: Diagnosis not present

## 2018-04-27 DIAGNOSIS — L97422 Non-pressure chronic ulcer of left heel and midfoot with fat layer exposed: Secondary | ICD-10-CM | POA: Diagnosis not present

## 2018-04-27 DIAGNOSIS — E10622 Type 1 diabetes mellitus with other skin ulcer: Secondary | ICD-10-CM | POA: Diagnosis not present

## 2018-04-27 DIAGNOSIS — G8254 Quadriplegia, C5-C7 incomplete: Secondary | ICD-10-CM | POA: Diagnosis not present

## 2018-04-29 ENCOUNTER — Encounter: Payer: Self-pay | Admitting: *Deleted
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
## 2018-04-29 NOTE — Progress Notes (Addendum)
WILBERTO, CONSOLE ([id number]) Visit Report for 04/27/2018 Arrival Information Details Patient Name: [name], [name]. Date of Service: 04/27/2018 3:00 PM Medical Record Number: [id number] Patient Account Number: [id number] Date of Birth/Sex: [birth year] (47 y.o. M) Treating RN: Montey Hora Primary Care Seven Dollens: Einar Pheasant Other Clinician: Referring Ritha Sampedro: Einar Pheasant Treating Markela Wee/Extender: Tito Dine in Treatment: 10 Visit Information History Since Last Visit Added or deleted any medications: No Patient Arrived: Wheel Chair Any new allergies or adverse reactions: No Arrival Time: 15:25 Had a fall or experienced change in No Accompanied By: self activities of daily living that may affect Transfer Assistance: None risk of falls: Patient Identification Verified: Yes Signs or symptoms of abuse/neglect since last visito No Secondary Verification Process Completed: Yes Hospitalized since last visit: No Patient Requires Transmission-Based No Implantable device outside of the clinic excluding No Precautions: cellular tissue based products placed in the center Patient Has Alerts: Yes since last visit: Patient Alerts: Type I Has Dressing in Place as Prescribed: Yes Diabetic Pain Present Now: No Electronic Signature(s) Signed: 04/27/2018 4:55:05 PM By: Montey Hora Entered By: Montey Hora on 04/27/2018 15:25:44 Gebhardt, [name] ([id number]) -------------------------------------------------------------------------------- Clinic Level of Care Assessment Details Patient Name: [name] Girt. Date of Service: 04/27/2018 3:00 PM Medical Record Number: [id number] Patient Account Number: [id number] Date of Birth/Sex: 11-27-1971 (47 y.o. M) Treating RN: Cornell Barman Primary Care Jaideep Pollack: Einar Pheasant Other Clinician: Referring Teri Diltz: Einar Pheasant Treating Sherel Fennell/Extender: Tito Dine in Treatment: 10 Clinic Level of Care Assessment  Items TOOL 4 Quantity Score []  - Use when only an EandM is performed on FOLLOW-UP visit 0 ASSESSMENTS - Nursing Assessment / Reassessment []  - Reassessment of Co-morbidities (includes updates in patient status) 0 X- 1 5 Reassessment of Adherence to Treatment Plan ASSESSMENTS - Wound and Skin Assessment / Reassessment X - Simple Wound Assessment / Reassessment - one wound 1 5 []  - 0 Complex Wound Assessment / Reassessment - multiple wounds []  - 0 Dermatologic / Skin Assessment (not related to wound area) ASSESSMENTS - Focused Assessment []  - Circumferential Edema Measurements - multi extremities 0 []  - 0 Nutritional Assessment / Counseling / Intervention []  - 0 Lower Extremity Assessment (monofilament, tuning fork, pulses) []  - 0 Peripheral Arterial Disease Assessment (using hand held doppler) ASSESSMENTS - Ostomy and/or Continence Assessment and Care []  - Incontinence Assessment and Management 0 []  - 0 Ostomy Care Assessment and Management (repouching, etc.) PROCESS - Coordination of Care X - Simple Patient / Family Education for ongoing care 1 15 []  - 0 Complex (extensive) Patient / Family Education for ongoing care []  - 0 Staff obtains Programmer, systems, Records, Test Results / Process Orders []  - 0 Staff telephones HHA, Nursing Homes / Clarify orders / etc []  - 0 Routine Transfer to another Facility (non-emergent condition) []  - 0 Routine Hospital Admission (non-emergent condition) []  - 0 New Admissions / Biomedical engineer / Ordering NPWT, Apligraf, etc. []  - 0 Emergency Hospital Admission (emergent condition) X- 1 10 Simple Discharge Coordination [employer], [name] ([id number]) []  - 0 Complex (extensive) Discharge Coordination PROCESS - Special Needs []  - Pediatric / Minor Patient Management 0 []  - 0 Isolation Patient Management []  - 0 Hearing / Language / Visual special needs []  - 0 Assessment of Community assistance (transportation, D/C planning, etc.) []  -  0 Additional assistance / Altered mentation []  - 0 Support Surface(s) Assessment (bed, cushion, seat, etc.) INTERVENTIONS - Wound Cleansing / Measurement X - Simple Wound Cleansing - one wound 1 5 []  -  0 Complex Wound Cleansing - multiple wounds X- 1 5 Wound Imaging (photographs - any number of wounds) []  - 0 Wound Tracing (instead of photographs) X- 1 5 Simple Wound Measurement - one wound []  - 0 Complex Wound Measurement - multiple wounds INTERVENTIONS - Wound Dressings []  - Small Wound Dressing one or multiple wounds 0 X- 1 15 Medium Wound Dressing one or multiple wounds []  - 0 Large Wound Dressing one or multiple wounds []  - 0 Application of Medications - topical []  - 0 Application of Medications - injection INTERVENTIONS - Miscellaneous []  - External ear exam 0 []  - 0 Specimen Collection (cultures, biopsies, blood, body fluids, etc.) []  - 0 Specimen(s) / Culture(s) sent or taken to Lab for analysis []  - 0 Patient Transfer (multiple staff / Civil Service fast streamer / Similar devices) []  - 0 Simple Staple / Suture removal (25 or less) []  - 0 Complex Staple / Suture removal (26 or more) []  - 0 Hypo / Hyperglycemic Management (close monitor of Blood Glucose) []  - 0 Ankle / Brachial Index (ABI) - do not check if billed separately X- 1 5 Vital Signs Guiney, [name] ([id number]) Has the patient been seen at the hospital within the last three years: Yes Total Score: 70 Level Of Care: New/Established - Level 2 Electronic Signature(s) Signed: 04/27/2018 5:19:09 PM By: Gretta Cool, BSN, RN, CWS, Kim RN, BSN Entered By: Gretta Cool, BSN, RN, CWS, Kim on 04/27/2018 16:33:15 Lenhard, [name] ([id number]) -------------------------------------------------------------------------------- Encounter Discharge Information Details Patient Name: [name] Girt. Date of Service: 04/27/2018 3:00 PM Medical Record Number: [id number] Patient Account Number: [id number] Date of Birth/Sex: May 25, 1971 (47 y.o.  M) Treating RN: Cornell Barman Primary Care Altin Sease: Einar Pheasant Other Clinician: Referring Barbarajean Kinzler: Einar Pheasant Treating Ethel Meisenheimer/Extender: Tito Dine in Treatment: 10 Encounter Discharge Information Items Discharge Condition: Stable Ambulatory Status: Ambulatory Discharge Destination: Home Transportation: Private Auto Accompanied By: self Schedule Follow-up Appointment: Yes Clinical Summary of Care: Electronic Signature(s) Signed: 04/27/2018 4:31:51 PM By: Gretta Cool, BSN, RN, CWS, Kim RN, BSN Entered By: Gretta Cool, BSN, RN, CWS, Kim on 04/27/2018 16:31:51 [place], [name] ([id number]) -------------------------------------------------------------------------------- Lower Extremity Assessment Details Patient Name: [name] Girt. Date of Service: 04/27/2018 3:00 PM Medical Record Number: [id number] Patient Account Number: [id number] Date of Birth/Sex: 1971-10-30 (47 y.o. M) Treating RN: Montey Hora Primary Care Lynnex Fulp: Einar Pheasant Other Clinician: Referring Merilynn Haydu: Einar Pheasant Treating Annison Birchard/Extender: Tito Dine in Treatment: 10 Edema Assessment Assessed: [Left: No] [Right: No] Edema: [Left: N] [Right: o] Vascular Assessment Pulses: Dorsalis Pedis Palpable: [Left:Yes] Posterior Tibial Extremity colors, hair growth, and conditions: Extremity Color: [Left:Mottled] Hair Growth on Extremity: [Left:No] Temperature of Extremity: [Left:Cool] Capillary Refill: [Left:< 3 seconds] Toe Nail Assessment Left: Right: Thick: Yes Discolored: No Deformed: No Improper Length and Hygiene: No Electronic Signature(s) Signed: 04/27/2018 4:55:05 PM By: Montey Hora Entered By: Montey Hora on 04/27/2018 15:35:14 Akram, [name] ([id number]) -------------------------------------------------------------------------------- Multi Wound Chart Details Patient Name: [name] Girt. Date of Service: 04/27/2018 3:00 PM Medical Record Number:  [id number] Patient Account Number: [id number] Date of Birth/Sex: 05/06/1971 (47 y.o. M) Treating RN: Cornell Barman Primary Care Twisha Vanpelt: Einar Pheasant Other Clinician: Referring Joeseph Verville: Einar Pheasant Treating Georjean Toya/Extender: Tito Dine in Treatment: 10 Vital Signs Height(in): Pulse(bpm): 64 Weight(lbs): Blood Pressure(mmHg): 95/59 Body Mass Index(BMI): Temperature(F): 97.9 Respiratory Rate 18 (breaths/min): Photos: [N/A:N/A] Wound Location: Left Calcaneus N/A N/A Wounding Event: Pressure Injury N/A N/A Primary Etiology: Pressure Ulcer N/A N/A Secondary Etiology: Diabetic Wound/Ulcer of the N/A N/A Lower Extremity  Comorbid History: Type I Diabetes, History of N/A N/A pressure wounds, Paraplegia Date Acquired: 02/10/2018 N/A N/A Weeks of Treatment: 10 N/A N/A Wound Status: Open N/A N/A Measurements L x W x D 0.3x0.4x0.2 N/A N/A (cm) Area (cm) : 0.094 N/A N/A Volume (cm) : 0.019 N/A N/A % Reduction in Area: 85.80% N/A N/A % Reduction in Volume: 85.60% N/A N/A Classification: Category/Stage III N/A N/A Exudate Amount: Medium N/A N/A Exudate Type: Serous N/A N/A Exudate Color: amber N/A N/A Wound Margin: Distinct, outline attached N/A N/A Granulation Amount: Large (67-100%) N/A N/A Granulation Quality: Red N/A N/A Necrotic Amount: Small (1-33%) N/A N/A Exposed Structures: Fascia: No N/A N/A Fat Layer (Subcutaneous Tissue) Exposed: No Tendon: No Muscle: No Holsclaw, [name] ([id number]) Joint: No Bone: No Epithelialization: None N/A N/A Periwound Skin Texture: Excoriation: No N/A N/A Induration: No Callus: No Crepitus: No Rash: No Scarring: No Periwound Skin Moisture: No Abnormalities Noted N/A N/A Periwound Skin Color: Atrophie Blanche: No N/A N/A Cyanosis: No Ecchymosis: No Erythema: No Hemosiderin Staining: No Mottled: No Pallor: No Rubor: No Temperature: No Abnormality N/A N/A Tenderness on Palpation: Yes N/A N/A Wound  Preparation: Ulcer Cleansing: N/A N/A Rinsed/Irrigated with Saline Topical Anesthetic Applied: Other: lidocaine 4% Treatment Notes Wound #7 (Left Calcaneus) 1. Cleansed with: Clean wound with Normal Saline 2. Anesthetic Topical Lidocaine 4% cream to wound bed prior to debridement 4. Dressing Applied: Other dressing (specify in notes) 5. Secondary Dressing Applied Bordered Foam Dressing Notes Endoform Electronic Signature(s) Signed: 04/27/2018 5:10:30 PM By: Linton Ham MD Entered By: Linton Ham on 04/27/2018 16:37:36 [place], [name] ([id number]) -------------------------------------------------------------------------------- [place] Details Patient Name: [name] Girt. Date of Service: 04/27/2018 3:00 PM Medical Record Number: [id number] Patient Account Number: [id number] Date of Birth/Sex: 28-Dec-1970 (47 y.o. M) Treating RN: Cornell Barman Primary Care Dantrell Schertzer: Einar Pheasant Other Clinician: Referring Shermon Bozzi: Einar Pheasant Treating Kadin Bera/Extender: Tito Dine in Treatment: 10 Active Inactive Electronic Signature(s) Signed: 05/17/2018 1:44:56 PM By: Gretta Cool, BSN, RN, CWS, Kim RN, BSN Previous Signature: 04/27/2018 5:19:09 PM Version By: Gretta Cool, BSN, RN, CWS, Kim RN, BSN Entered By: Gretta Cool, BSN, RN, CWS, Kim on 05/17/2018 13:44:55 [place], [name] ([id number]) -------------------------------------------------------------------------------- Pain Assessment Details Patient Name: [name] Girt. Date of Service: 04/27/2018 3:00 PM Medical Record Number: [id number] Patient Account Number: [id number] Date of Birth/Sex: 09-22-1971 (47 y.o. M) Treating RN: Montey Hora Primary Care Teniya Filter: Einar Pheasant Other Clinician: Referring Hady Niemczyk: Einar Pheasant Treating Dovid Bartko/Extender: Tito Dine in Treatment: 10 Active Problems Location of Pain Severity and Description of Pain Patient Has Paino Yes Site Locations Pain  Location: Pain in Ulcers With Dressing Change: Yes Duration of the Pain. Constant / Intermittento Intermittent Pain Management and Medication Current Pain Management: Electronic Signature(s) Signed: 04/27/2018 4:55:05 PM By: Montey Hora Entered By: Montey Hora on 04/27/2018 15:28:28 [place]aba, [name] ([id number]) -------------------------------------------------------------------------------- Patient/Caregiver Education Details Patient Name: [name] Girt. Date of Service: 04/27/2018 3:00 PM Medical Record Number: [id number] Patient Account Number: [id number] Date of Birth/Gender: 1971/10/17 (47 y.o. M) Treating RN: Cornell Barman Primary Care Physician: Einar Pheasant Other Clinician: Referring Physician: Einar Pheasant Treating Physician/Extender: Tito Dine in Treatment: 10 Education Assessment Education Provided To: Patient and Caregiver Education Topics Provided Wound/Skin Impairment: Handouts: Caring for Your Ulcer Methods: Demonstration, Explain/Verbal Responses: State content correctly Electronic Signature(s) Signed: 04/27/2018 5:19:09 PM By: Gretta Cool, BSN, RN, CWS, Kim RN, BSN Entered By: Gretta Cool, BSN, RN, CWS, Kim on 04/27/2018 16:32:16 Suell, [name] ([id number]) -------------------------------------------------------------------------------- Wound Assessment Details  Patient Name: [name], [name]. Date of Service: 04/27/2018 3:00 PM Medical Record Number: [id number] Patient Account Number: [id number] Date of Birth/Sex: 15-Feb-1971 (47 y.o. M) Treating RN: Montey Hora Primary Care Starlette Thurow: Einar Pheasant Other Clinician: Referring Tonga Prout: Einar Pheasant Treating Manaal Mandala/Extender: Tito Dine in Treatment: 10 Wound Status Wound Number: 7 Primary Etiology: Pressure Ulcer Wound Location: Left Calcaneus Secondary Diabetic Wound/Ulcer of the Lower Etiology: Extremity Wounding Event: Pressure Injury Wound Status: Open Date Acquired:  02/10/2018 Comorbid Type I Diabetes, History of pressure Weeks Of Treatment: 10 History: wounds, Paraplegia Clustered Wound: No Photos Photo Uploaded By: Montey Hora on 04/27/2018 16:12:25 Wound Measurements Length: (cm) 0.3 Width: (cm) 0.4 Depth: (cm) 0.2 Area: (cm) 0.094 Volume: (cm) 0.019 % Reduction in Area: 85.8% % Reduction in Volume: 85.6% Epithelialization: None Tunneling: No Undermining: No Wound Description Classification: Category/Stage III Foul O Wound Margin: Distinct, outline attached Slough Exudate Amount: Medium Exudate Type: Serous Exudate Color: amber dor After Cleansing: No /Fibrino Yes Wound Bed Granulation Amount: Large (67-100%) Exposed Structure Granulation Quality: Red Fascia Exposed: No Necrotic Amount: Small (1-33%) Fat Layer (Subcutaneous Tissue) Exposed: No Necrotic Quality: Adherent Slough Tendon Exposed: No Muscle Exposed: No Joint Exposed: No Bone Exposed: No Periwound Skin Texture Heinke, [name] ([id number]) Texture Color No Abnormalities Noted: No No Abnormalities Noted: No Callus: No Atrophie Blanche: No Crepitus: No Cyanosis: No Excoriation: No Ecchymosis: No Induration: No Erythema: No Rash: No Hemosiderin Staining: No Scarring: No Mottled: No Pallor: No Moisture Rubor: No No Abnormalities Noted: No Temperature / Pain Temperature: No Abnormality Tenderness on Palpation: Yes Wound Preparation Ulcer Cleansing: Rinsed/Irrigated with Saline Topical Anesthetic Applied: Other: lidocaine 4%, Electronic Signature(s) Signed: 04/27/2018 4:55:05 PM By: Montey Hora Entered By: Montey Hora on 04/27/2018 15:34:38 Ehresman, [name] ([id number]) -------------------------------------------------------------------------------- Vitals Details Patient Name: [name] Girt. Date of Service: 04/27/2018 3:00 PM Medical Record Number: [id number] Patient Account Number: [id number] Date of Birth/Sex: 01-13-1971 (47 y.o.  M) Treating RN: Montey Hora Primary Care Romari Gasparro: Einar Pheasant Other Clinician: Referring Rodriques Badie: Einar Pheasant Treating Ashlinn Hemrick/Extender: Tito Dine in Treatment: 10 Vital Signs Time Taken: 15:28 Temperature (F): 97.9 Pulse (bpm): 63 Respiratory Rate (breaths/min): 18 Blood Pressure (mmHg): 95/59 Reference Range: 80 - 120 mg / dl Electronic Signature(s) Signed: 04/27/2018 4:55:05 PM By: Montey Hora Entered By: Montey Hora on 04/27/2018 15:28:50

## 2018-05-03 ENCOUNTER — Encounter
Admission: RE | Disposition: A | Discharge status: Home / Self Care | Payer: Self-pay | Source: Ambulatory Visit | Attending: Internal Medicine

## 2018-05-03 ENCOUNTER — Ambulatory Visit
Admission: RE | Admit: 2018-05-03 | Discharge: 2018-05-03 | Disposition: A | Discharge status: Home / Self Care | Payer: Medicare Other | Source: Ambulatory Visit | Attending: Internal Medicine | Admitting: Internal Medicine

## 2018-05-03 ENCOUNTER — Ambulatory Visit: Payer: Medicare Other | Admitting: Anesthesiology

## 2018-05-03 ENCOUNTER — Encounter: Payer: Self-pay | Admitting: Anesthesiology

## 2018-05-03 DIAGNOSIS — E109 Type 1 diabetes mellitus without complications: Secondary | ICD-10-CM | POA: Diagnosis not present

## 2018-05-03 DIAGNOSIS — K626 Ulcer of anus and rectum: Secondary | ICD-10-CM | POA: Insufficient documentation

## 2018-05-03 DIAGNOSIS — Z791 Long term (current) use of non-steroidal anti-inflammatories (NSAID): Secondary | ICD-10-CM | POA: Insufficient documentation

## 2018-05-03 DIAGNOSIS — K921 Melena: Secondary | ICD-10-CM | POA: Insufficient documentation

## 2018-05-03 DIAGNOSIS — G8254 Quadriplegia, C5-C7 incomplete: Secondary | ICD-10-CM | POA: Diagnosis not present

## 2018-05-03 DIAGNOSIS — K6289 Other specified diseases of anus and rectum: Secondary | ICD-10-CM | POA: Diagnosis not present

## 2018-05-03 DIAGNOSIS — Z79899 Other long term (current) drug therapy: Secondary | ICD-10-CM | POA: Insufficient documentation

## 2018-05-03 DIAGNOSIS — K635 Polyp of colon: Secondary | ICD-10-CM | POA: Diagnosis not present

## 2018-05-03 DIAGNOSIS — K623 Rectal prolapse: Secondary | ICD-10-CM | POA: Insufficient documentation

## 2018-05-03 DIAGNOSIS — Z794 Long term (current) use of insulin: Secondary | ICD-10-CM | POA: Diagnosis not present

## 2018-05-03 DIAGNOSIS — D122 Benign neoplasm of ascending colon: Secondary | ICD-10-CM | POA: Diagnosis not present

## 2018-05-03 DIAGNOSIS — K642 Third degree hemorrhoids: Secondary | ICD-10-CM | POA: Insufficient documentation

## 2018-05-03 DIAGNOSIS — D126 Benign neoplasm of colon, unspecified: Secondary | ICD-10-CM | POA: Diagnosis not present

## 2018-05-03 DIAGNOSIS — K5120 Ulcerative (chronic) proctitis without complications: Secondary | ICD-10-CM | POA: Diagnosis not present

## 2018-05-03 HISTORY — PX: COLONOSCOPY WITH PROPOFOL: SHX5780

## 2018-05-03 HISTORY — DX: Autonomic dysreflexia: G90.4

## 2018-05-03 HISTORY — DX: Trigger finger, unspecified finger: M65.30

## 2018-05-03 SURGERY — COLONOSCOPY WITH PROPOFOL
Anesthesia: General

## 2018-05-03 MED ORDER — LIDOCAINE HCL (PF) 2 % IJ SOLN
INTRAMUSCULAR | Status: AC
  Filled 2018-05-03: qty 10

## 2018-05-03 MED ORDER — PROPOFOL 10 MG/ML IV BOLUS
INTRAVENOUS | Status: DC | PRN
  Administered 2018-05-03: 70 mg via INTRAVENOUS
  Administered 2018-05-03: 50 mg via INTRAVENOUS
  Administered 2018-05-03: 20 mg via INTRAVENOUS

## 2018-05-03 MED ORDER — SODIUM CHLORIDE 0.9 % IV SOLN
INTRAVENOUS | Status: DC
  Administered 2018-05-03: 1000 mL via INTRAVENOUS
  Administered 2018-05-03: 10:00:00 via INTRAVENOUS

## 2018-05-03 MED ORDER — PROPOFOL 500 MG/50ML IV EMUL
INTRAVENOUS | Status: DC | PRN
  Administered 2018-05-03: 125 ug/kg/min via INTRAVENOUS

## 2018-05-03 MED ORDER — PROPOFOL 500 MG/50ML IV EMUL
INTRAVENOUS | Status: AC
  Filled 2018-05-03: qty 50

## 2018-05-03 MED ORDER — LIDOCAINE HCL (CARDIAC) PF 100 MG/5ML IV SOSY
PREFILLED_SYRINGE | INTRAVENOUS | Status: DC | PRN
  Administered 2018-05-03: 30 mg via INTRAVENOUS

## 2018-05-03 NOTE — H&P (Signed)
Outpatient short stay form Pre-procedure 05/03/2018 10:20 AM [name], M.D.  Primary Physician: Einar Pheasant, M.D.  Reason for visit:  Melena  History of present illness:  47 y/o male with now resolved melena. EGD was essentially negative performed on 04/19/18. Had a colonoscopy by Dr. Allen Norris in 2014 showing some ulcerated mucosa in the rectum. Random bx of colon and term ileum were obtained that were negative for colitis.Rectal ulcer biopsies showed "superficial villi" (Contaminant from terminal ileum biopsies".     Current Facility-Administered Medications:  .  0.9 %  sodium chloride infusion, , Intravenous, Continuous, [place], Benay Pike, MD  Medications Prior to Admission  Medication Sig Dispense Refill Last Dose  . Ascorbic Acid (VITAMIN C) 1000 MG tablet Take 1,000 mg by mouth 2 (two) times daily.   Past Week at Unknown time  . B-D UF III MINI PEN NEEDLES 31G X 5 MM MISC AS DIRECTED. 100 each 0 05/02/2018 at Unknown time  . baclofen (LIORESAL) 20 MG tablet TAKE TWO TABLETS THREE TIMES A DAY. 180 tablet 0 05/03/2018 at 0700  . hydrocortisone (ANUSOL-HC) 25 MG suppository Place 1 suppository (25 mg total) rectally 2 (two) times daily. 14 suppository 0 Past Week at Unknown time  . ibuprofen (ADVIL,MOTRIN) 200 MG tablet Take 400-600 mg by mouth every 6 (six) hours as needed (for pain.).   Past Week at Unknown time  . insulin detemir (LEVEMIR) 100 UNIT/ML injection Inject 29 units at bedtime 10 mL 0 Past Week at Unknown time  . insulin glargine (LANTUS) 100 UNIT/ML injection Lantus U-100 Insulin 100 unit/mL subcutaneous solution   Past Week at Unknown time  . insulin lispro (HUMALOG KWIKPEN) 100 UNIT/ML KiwkPen Take as needed Dx: E10.9 15 mL 11 Past Week at Unknown time  . methenamine (MANDELAMINE) 1 G tablet Take 1,000 mg by mouth 2 (two) times daily.   05/02/2018 at Unknown time  . Multiple Vitamin (MULTIVITAMIN) tablet Take 1 tablet by mouth daily.   Past Week at Unknown time  .  Multiple Vitamins-Minerals (ZINC PO) Take by mouth.   Past Week at Unknown time  . naphazoline-glycerin (CLEAR EYES) 0.012-0.2 % SOLN Place 1-2 drops into both eyes 4 (four) times daily as needed for irritation.   Past Week at Unknown time  . omeprazole (PRILOSEC) 40 MG capsule Take 40 mg by mouth daily.   Past Week at Unknown time  . sucralfate (CARAFATE) 1 g tablet Take 1 g by mouth 4 (four) times daily -  with meals and at bedtime.   Not Taking at Unknown time  . vitamin A 10000 UNIT capsule Take 10,000 Units by mouth daily.   Not Taking at Unknown time     Allergies  Allergen Reactions  . Acetaminophen   . Decongestant [Pseudoephedrine Hcl] Other (See Comments)    Cause UTIs  . Ivp Dye [Iodinated Diagnostic Agents] Other (See Comments)    Urticaria   . Sulfa Antibiotics Other (See Comments)    Tongue swells     Past Medical History:  Diagnosis Date  . Autonomic dysreflexia   . 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 Depression   . Fainting episodes   . History of frequent urinary tract infections    neurogenic bladder  . History of kidney stones   . Low blood pressure   . Nephrolithiasis    STONES  . Quadriplegia, C5-C7, incomplete ([place])    C7 s/p cervical fusion (secondary to [place])  .  Trigger finger    right ring finger of right hand  . Type 1 diabetes mellitus (HCC)    type 1  . Urinary tract bacterial infections   . Urine incontinence     Review of systems:      Physical Exam  Gen: Alert, oriented. Appears stated age.  HEENT: [place]/AT. PERRLA. Lungs: CTA, no wheezes. CV: RR nl S1, S2. Abd: soft, benign, no masses. BS+ Ext: No edema. Pulses 2+    Planned procedures: Proceed with colonoscopy. The patient understands the nature of the planned procedure, indications, risks, alternatives and potential complications including but not limited to bleeding, infection, perforation, damage to internal organs and possible  oversedation/side effects from anesthesia. The patient agrees and gives consent to proceed.  Please refer to procedure notes for findings, recommendations and patient disposition/instructions.    [name], M.D. Gastroenterology 05/03/2018  10:20 AM

## 2018-05-03 NOTE — Interval H&P Note (Signed)
History and Physical Interval Note:  05/03/2018 10:25 AM  [place]  has presented today for surgery, with the diagnosis of MELENA  The various methods of treatment have been discussed with the patient and family. After consideration of risks, benefits and other options for treatment, the patient has consented to  Procedure(s): COLONOSCOPY WITH PROPOFOL (N/A) as a surgical intervention .  The patient's history has been reviewed, patient examined, no change in status, stable for surgery.  I have reviewed the patient's chart and labs.  Questions were answered to the patient's satisfaction.     [place], [place]

## 2018-05-03 NOTE — Anesthesia Postprocedure Evaluation (Signed)
Anesthesia Post Note  Patient: [name] Setting [place]  Procedure(s) Performed: COLONOSCOPY WITH PROPOFOL (N/A )  Patient location during evaluation: Endoscopy Anesthesia Type: General Level of consciousness: awake and alert Pain management: pain level controlled Vital Signs Assessment: post-procedure vital signs reviewed and stable Respiratory status: spontaneous breathing, nonlabored ventilation, respiratory function stable and patient connected to nasal cannula oxygen Cardiovascular status: blood pressure returned to baseline and stable Postop Assessment: no apparent nausea or vomiting Anesthetic complications: no     Last Vitals:  Vitals:   05/03/18 1058 05/03/18 1110  BP: (!) 73/40 (!) 94/58  Pulse: 66 61  Resp: 16 12  Temp: (!) 35.8 C   SpO2: 100% 100%    Last Pain:  Vitals:   05/03/18 1110  TempSrc:   PainSc: 0-No pain                 Keeya Dyckman S

## 2018-05-03 NOTE — Anesthesia Post-op Follow-up Note (Signed)
Anesthesia QCDR form completed.        

## 2018-05-03 NOTE — Anesthesia Preprocedure Evaluation (Signed)
Anesthesia Evaluation  Patient identified by MRN, date of birth, ID band Patient awake    Reviewed: Allergy & Precautions, NPO status , Patient's Chart, lab work & pertinent test results, reviewed documented beta blocker date and time   Airway Mallampati: II  TM Distance: >3 FB     Dental  (+) Chipped, Missing   Pulmonary           Cardiovascular      Neuro/Psych PSYCHIATRIC DISORDERS Depression    GI/Hepatic   Endo/Other  diabetes, Type 1  Renal/GU Renal disease     Musculoskeletal   Abdominal   Peds  Hematology  (+) anemia ,   Anesthesia Other Findings Does not use insulin pump. ETOH. Paraplegic.  Reproductive/Obstetrics                             Anesthesia Physical Anesthesia Plan  ASA: III  Anesthesia Plan: General   Post-op Pain Management:    Induction: Intravenous  PONV Risk Score and Plan:   Airway Management Planned:   Additional Equipment:   Intra-op Plan:   Post-operative Plan:   Informed Consent: I have reviewed the patients History and Physical, chart, labs and discussed the procedure including the risks, benefits and alternatives for the proposed anesthesia with the patient or authorized representative who has indicated his/her understanding and acceptance.     Plan Discussed with: CRNA  Anesthesia Plan Comments:         Anesthesia Quick Evaluation

## 2018-05-03 NOTE — Transfer of Care (Signed)
Immediate Anesthesia Transfer of Care Note  Patient: [name]  Procedure(s) Performed: COLONOSCOPY WITH PROPOFOL (N/A )  Patient Location: PACU and Endoscopy Unit  Anesthesia Type:General  Level of Consciousness: awake  Airway & Oxygen Therapy: Patient Spontanous Breathing  Post-op Assessment: Report given to RN  Post vital signs: stable  Last Vitals:  Vitals Value Taken Time  BP    Temp    Pulse    Resp    SpO2      Last Pain:  Vitals:   05/03/18 0947  TempSrc: Tympanic  PainSc: 6       Patients Stated Pain Goal: 0 (86/57/84 6962)  Complications: No apparent anesthesia complications

## 2018-05-03 NOTE — Op Note (Signed)
[facility]enter[facility] Patient Name: [name] Procedure Date: 05/03/2018 10:22 AM MRN: [id number] Account #: [id number] Date of Birth: 23-Jul-1971 Admit Type: Outpatient Age: 47 Room: [facility] ENDO ROOM 3 Gender: Male Note Status: Finalized Procedure:            Colonoscopy Indications:          Melena Providers:            Benay Pike. Alice Reichert MD, MD Referring MD:         Guadalupe Maple, MD (Referring MD) Medicines:            Propofol per Anesthesia Complications:        No immediate complications. Procedure:            Pre-Anesthesia Assessment:                       - The risks and benefits of the procedure and the                        sedation options and risks were discussed with the                        patient. All questions were answered and informed                        consent was obtained.                       - Patient identification and proposed procedure were                        verified prior to the procedure by the nurse. The                        procedure was verified in the procedure room.                       - ASA Grade Assessment: III - A patient with severe                        systemic disease.                       - After reviewing the risks and benefits, the patient                        was deemed in satisfactory condition to undergo the                        procedure.                       After obtaining informed consent, the colonoscope was                        passed under direct vision. Throughout the procedure,                        the patient's blood pressure, pulse, and oxygen  saturations were monitored continuously. The                        Colonoscope was introduced through the anus and                        advanced to the the cecum, identified by appendiceal                        orifice and ileocecal valve. The colonoscopy was                        performed without  difficulty. The patient tolerated the                        procedure well. The quality of the bowel preparation                        was good. The ileocecal valve, appendiceal orifice, and                        rectum were photographed. Findings:      The perianal exam findings include Grade 2 rectal prolapse.      A 6 mm polyp was found in the proximal ascending colon. The polyp was       sessile. Polypectomy was attempted, initially using a cold snare. Polyp       resection was incomplete with this device. This intervention then       required a different device and polypectomy technique. The polyp was       removed with a cold biopsy forceps. Resection and retrieval were       complete.      Non-bleeding internal hemorrhoids were found during retroflexion. The       hemorrhoids were Grade III (internal hemorrhoids that prolapse but       require manual reduction).      Anal papilla(e) were hypertrophied.      A single (solitary) 40 mm ulcer was found in the rectum. No bleeding was       present. No stigmata of recent bleeding were seen. Biopsies were taken       with a cold forceps for histology.      The exam was otherwise without abnormality. Impression:           - Grade 2 rectal prolapse found on perianal exam.                       - One 6 mm polyp in the proximal ascending colon,                        removed with a cold biopsy forceps. Resected and                        retrieved.                       - Non-bleeding internal hemorrhoids.                       - Anal papilla(e) were hypertrophied.                       -  A single (solitary) ulcer in the rectum. Biopsied.                       - The examination was otherwise normal. Recommendation:       - Patient has a contact number available for                        emergencies. The signs and symptoms of potential                        delayed complications were discussed with the patient.                         Return to normal activities tomorrow. Written discharge                        instructions were provided to the patient.                       - Resume previous diet.                       - Continue present medications.                       - Await pathology results.                       - Repeat colonoscopy is recommended for surveillance.                        The colonoscopy date will be determined after pathology                        results from today's exam become available for review.                       - Return to GI office in 3 months. Procedure Code(s):    --- Professional ---                       [phone], Colonoscopy, flexible; with biopsy, single or                        multiple Diagnosis Code(s):    --- Professional ---                       K92.1, Melena (includes Hematochezia)                       K62.6, Ulcer of anus and rectum                       K62.89, Other specified diseases of anus and rectum                       D12.2, Benign neoplasm of ascending colon                       K64.2, Third degree hemorrhoids CPT copyright 2017 [employer]. All rights reserved. The codes documented in this report are preliminary and upon coder review may  be revised to meet current compliance requirements. Efrain Sella MD, MD 05/03/2018 10:56:59 AM This report has been signed electronically. Number of Addenda: 0 Note Initiated On: 05/03/2018 10:22 AM Scope Withdrawal Time: 0 hours 9 minutes 6 seconds  Total Procedure Duration: 0 hours 12 minutes 43 seconds       [facility]

## 2018-05-04 ENCOUNTER — Encounter: Payer: Self-pay | Admitting: Internal Medicine

## 2018-05-05 ENCOUNTER — Encounter: Payer: Self-pay | Admitting: Internal Medicine

## 2018-05-05 DIAGNOSIS — Z0000 Encounter for general adult medical examination without abnormal findings: Secondary | ICD-10-CM | POA: Insufficient documentation

## 2018-05-05 LAB — SURGICAL PATHOLOGY

## 2018-05-09 ENCOUNTER — Other Ambulatory Visit: Payer: Self-pay | Admitting: Internal Medicine

## 2018-05-09 ENCOUNTER — Encounter: Payer: Self-pay | Admitting: Internal Medicine

## 2018-05-09 NOTE — Telephone Encounter (Signed)
Patient last seen in May 2018 this last filled on 11/2017 patient does not have an upcoming appt. Can this be filled?

## 2018-05-09 NOTE — Telephone Encounter (Signed)
Pt is out of meds and needs refill asap

## 2018-05-11 ENCOUNTER — Ambulatory Visit: Payer: Medicare Other | Admitting: Internal Medicine

## 2018-05-12 DIAGNOSIS — K626 Ulcer of anus and rectum: Secondary | ICD-10-CM | POA: Diagnosis not present

## 2018-05-12 DIAGNOSIS — K623 Rectal prolapse: Secondary | ICD-10-CM | POA: Diagnosis not present

## 2018-05-12 DIAGNOSIS — K2970 Gastritis, unspecified, without bleeding: Secondary | ICD-10-CM | POA: Diagnosis not present

## 2018-05-12 DIAGNOSIS — K644 Residual hemorrhoidal skin tags: Secondary | ICD-10-CM | POA: Diagnosis not present

## 2018-06-07 ENCOUNTER — Ambulatory Visit: Payer: Medicare Other

## 2018-07-16 ENCOUNTER — Other Ambulatory Visit: Payer: Self-pay | Admitting: Internal Medicine

## 2018-07-26 ENCOUNTER — Other Ambulatory Visit: Payer: Self-pay | Admitting: Internal Medicine

## 2018-07-29 ENCOUNTER — Other Ambulatory Visit: Payer: Self-pay | Admitting: Internal Medicine

## 2018-08-05 ENCOUNTER — Ambulatory Visit
Admission: EM | Admit: 2018-08-05 | Discharge: 2018-08-05 | Disposition: A | Discharge status: Home / Self Care | Payer: Medicare Other | Attending: Family Medicine | Admitting: Family Medicine

## 2018-08-05 DIAGNOSIS — N390 Urinary tract infection, site not specified: Secondary | ICD-10-CM

## 2018-08-05 DIAGNOSIS — R300 Dysuria: Secondary | ICD-10-CM

## 2018-08-05 DIAGNOSIS — R8290 Unspecified abnormal findings in urine: Secondary | ICD-10-CM | POA: Diagnosis not present

## 2018-08-05 LAB — URINALYSIS, COMPLETE (UACMP) WITH MICROSCOPIC
BILIRUBIN URINE: NEGATIVE
Glucose, UA: 250 mg/dL — AB
HGB URINE DIPSTICK: NEGATIVE
KETONES UR: NEGATIVE mg/dL
Nitrite: NEGATIVE
PROTEIN: NEGATIVE mg/dL
RBC / HPF: NONE SEEN RBC/hpf (ref 0–5)
Specific Gravity, Urine: 1.01 (ref 1.005–1.030)
pH: 6.5 (ref 5.0–8.0)

## 2018-08-05 MED ORDER — CIPROFLOXACIN HCL 500 MG PO TABS: 500.0000 mg | ORAL_TABLET | Freq: Two times a day (BID) | ORAL | 0 refills | Status: DC
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
## 2018-08-05 NOTE — ED Triage Notes (Signed)
Pt here for UTI for 1 day. Does have a urologist but they're closed on Fridays. Pt complains of kidney pain on his right side, does have a hx of kidney stones. Also states his urine has a foul smell and fatigue as well. Would like antibiotics and a culture done. Does use a condom cath to urinate. On a prophylactic medication

## 2018-08-09 LAB — URINE CULTURE
Culture: >=100000 — AB
SPECIAL REQUESTS: NORMAL

## 2018-08-11 ENCOUNTER — Encounter: Payer: Self-pay | Admitting: Internal Medicine

## 2018-08-11 ENCOUNTER — Ambulatory Visit (INDEPENDENT_AMBULATORY_CARE_PROVIDER_SITE_OTHER): Payer: Medicare Other

## 2018-08-11 ENCOUNTER — Ambulatory Visit (INDEPENDENT_AMBULATORY_CARE_PROVIDER_SITE_OTHER): Payer: Medicare Other | Admitting: Internal Medicine

## 2018-08-11 VITALS — BP 110/64 | HR 97 | Temp 97.9°F | Resp 18 | Wt 164.6 lb

## 2018-08-11 VITALS — BP 110/64 | HR 67 | Temp 97.9°F | Resp 15 | Wt 164.6 lb

## 2018-08-11 DIAGNOSIS — R197 Diarrhea, unspecified: Secondary | ICD-10-CM | POA: Diagnosis not present

## 2018-08-11 DIAGNOSIS — Z0000 Encounter for general adult medical examination without abnormal findings: Secondary | ICD-10-CM | POA: Diagnosis not present

## 2018-08-11 DIAGNOSIS — M6530 Trigger finger, unspecified finger: Secondary | ICD-10-CM

## 2018-08-11 DIAGNOSIS — G8220 Paraplegia, unspecified: Secondary | ICD-10-CM

## 2018-08-11 DIAGNOSIS — E109 Type 1 diabetes mellitus without complications: Secondary | ICD-10-CM

## 2018-08-11 DIAGNOSIS — F1010 Alcohol abuse, uncomplicated: Secondary | ICD-10-CM

## 2018-08-11 DIAGNOSIS — Z87440 Personal history of urinary (tract) infections: Secondary | ICD-10-CM

## 2018-08-11 DIAGNOSIS — D649 Anemia, unspecified: Secondary | ICD-10-CM

## 2018-08-11 DIAGNOSIS — N3000 Acute cystitis without hematuria: Secondary | ICD-10-CM | POA: Diagnosis not present

## 2018-08-11 LAB — HEPATIC FUNCTION PANEL
ALBUMIN: 4.1 g/dL (ref 3.5–5.2)
ALK PHOS: 83 U/L (ref 39–117)
ALT: 20 U/L (ref 0–53)
AST: 21 U/L (ref 0–37)
BILIRUBIN DIRECT: 0.2 mg/dL (ref 0.0–0.3)
Total Bilirubin: 0.9 mg/dL (ref 0.2–1.2)
Total Protein: 6.7 g/dL (ref 6.0–8.3)

## 2018-08-11 LAB — CBC WITH DIFFERENTIAL/PLATELET
BASOS PCT: 0.3 % (ref 0.0–3.0)
Basophils Absolute: 0 10*3/uL (ref 0.0–0.1)
EOS PCT: 0.7 % (ref 0.0–5.0)
Eosinophils Absolute: 0.1 10*3/uL (ref 0.0–0.7)
HCT: 37.6 % — ABNORMAL LOW (ref 39.0–52.0)
Hemoglobin: 13.2 g/dL (ref 13.0–17.0)
LYMPHS ABS: 0.7 10*3/uL (ref 0.7–4.0)
Lymphocytes Relative: 9.4 % — ABNORMAL LOW (ref 12.0–46.0)
MCHC: 35.1 g/dL (ref 30.0–36.0)
MCV: 95.8 fl (ref 78.0–100.0)
MONOS PCT: 6.1 % (ref 3.0–12.0)
Monocytes Absolute: 0.5 10*3/uL (ref 0.1–1.0)
NEUTROS ABS: 6.2 10*3/uL (ref 1.4–7.7)
NEUTROS PCT: 83.5 % — AB (ref 43.0–77.0)
PLATELETS: 216 10*3/uL (ref 150.0–400.0)
RBC: 3.92 Mil/uL — ABNORMAL LOW (ref 4.22–5.81)
RDW: 12.7 % (ref 11.5–15.5)
WBC: 7.4 10*3/uL (ref 4.0–10.5)

## 2018-08-11 LAB — LIPID PANEL
Cholesterol: 158 mg/dL (ref 0–200)
HDL: 82.6 mg/dL (ref >39.00)
LDL Cholesterol: 57 mg/dL (ref 0–99)
NonHDL: 74.96
Total CHOL/HDL Ratio: 2
Triglycerides: 90 mg/dL (ref 0.0–149.0)
VLDL: 18 mg/dL (ref 0.0–40.0)

## 2018-08-11 LAB — BASIC METABOLIC PANEL
BUN: 7 mg/dL (ref 6–23)
CO2: 32 meq/L (ref 19–32)
CREATININE: 0.64 mg/dL (ref 0.40–1.50)
Calcium: 9.2 mg/dL (ref 8.4–10.5)
Chloride: 92 mEq/L — ABNORMAL LOW (ref 96–112)
GFR: 141.63 mL/min (ref >60.00)
Glucose, Bld: 124 mg/dL — ABNORMAL HIGH (ref 70–99)
Potassium: 4.5 mEq/L (ref 3.5–5.1)
Sodium: 128 mEq/L — ABNORMAL LOW (ref 135–145)

## 2018-08-11 LAB — TSH: TSH: 1.27 u[IU]/mL (ref 0.35–4.50)

## 2018-08-11 LAB — HEMOGLOBIN A1C: HEMOGLOBIN A1C: 7.2 % — AB (ref 4.6–6.5)

## 2018-08-11 MED ORDER — BACLOFEN 20 MG PO TABS: ORAL_TABLET | ORAL | 5 refills | Status: DC

## 2018-08-11 MED ORDER — INSULIN DETEMIR 100 UNIT/ML ~~LOC~~ SOLN: SUBCUTANEOUS | 5 refills | Status: DC

## 2018-08-11 MED ORDER — INSULIN LISPRO 100 UNIT/ML (KWIKPEN): PEN_INJECTOR | SUBCUTANEOUS | 11 refills | Status: DC

## 2018-08-11 MED ORDER — CIPROFLOXACIN HCL 500 MG PO TABS: 500.0000 mg | ORAL_TABLET | Freq: Two times a day (BID) | ORAL | 0 refills | Status: DC
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
## 2018-08-11 NOTE — Patient Instructions (Signed)
Take a probiotic daily while you are on antibiotics and for two weeks after completing the antibiotic

## 2018-08-11 NOTE — Patient Instructions (Addendum)
  Mr. Blank , Thank you for taking time to come for your Medicare Wellness Visit. I appreciate your ongoing commitment to your health goals. Please review the following plan we discussed and let me know if I can assist you in the future.   Follow up as needed.    Bring a copy of your [place] and/or Living Will to be scanned into chart.  Have a great day!  These are the goals we discussed: Goals    . Increase physical activity     Arm strengthening exercises       This is a list of the screening recommended for you and due dates:  Health Maintenance  Topic Date Due  . Pneumococcal vaccine  03/16/1972  . Complete foot exam   03/16/1980  . Eye exam for diabetics  03/16/1980  . HIV Screening  03/16/1985  . Tetanus Vaccine  03/16/1989  . Hemoglobin A1C  10/31/2017  . Urine Protein Check  12/09/2017  . Flu Shot  07/07/2018
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
## 2018-08-12 ENCOUNTER — Other Ambulatory Visit: Payer: Self-pay | Admitting: Internal Medicine

## 2018-08-12 DIAGNOSIS — E7800 Pure hypercholesterolemia, unspecified: Secondary | ICD-10-CM

## 2018-08-12 DIAGNOSIS — D649 Anemia, unspecified: Secondary | ICD-10-CM

## 2018-08-12 NOTE — Progress Notes (Signed)
Orders placed for f/u labs.  

## 2018-08-13 ENCOUNTER — Encounter: Payer: Self-pay | Admitting: Internal Medicine

## 2018-08-13 NOTE — Assessment & Plan Note (Signed)
Sugars as outlined.  Overall appears to be doing ok.  Brought in no recorded sugar readings.  Discussed insulin pump.  He declines.  Desires no further intervention.  Discussed decreased alcohol intake.  Follow met b and a1c.

## 2018-08-13 NOTE — Assessment & Plan Note (Signed)
Stable

## 2018-08-13 NOTE — Assessment & Plan Note (Signed)
Discussed the need to decrease alcohol intake.  Follow.

## 2018-08-13 NOTE — Assessment & Plan Note (Signed)
S/p previous injection.  Started having some issues again.  Will notify me when agreeable to referral back to ortho.

## 2018-08-13 NOTE — Assessment & Plan Note (Signed)
Has a catheter.  Has a history of recurring infections.  Is followed by urology.  Recent UTI.  On cipro now.  Feels better, but still with persistent symptoms.  Extend out abx.  Start a probiotic.  Eating.  Check metabolic panel.  F/u with urology as discussed.

## 2018-08-13 NOTE — Assessment & Plan Note (Signed)
Follow cbc.  

## 2018-08-15 ENCOUNTER — Other Ambulatory Visit: Payer: Self-pay

## 2018-08-15 ENCOUNTER — Encounter: Payer: Self-pay | Admitting: Emergency Medicine

## 2018-08-15 ENCOUNTER — Emergency Department
Admission: EM | Admit: 2018-08-15 | Discharge: 2018-08-15 | Disposition: A | Discharge status: Home / Self Care | Payer: Medicare Other | Attending: Emergency Medicine | Admitting: Emergency Medicine

## 2018-08-15 ENCOUNTER — Other Ambulatory Visit (INDEPENDENT_AMBULATORY_CARE_PROVIDER_SITE_OTHER): Payer: Medicare Other

## 2018-08-15 ENCOUNTER — Other Ambulatory Visit: Payer: Self-pay | Admitting: Internal Medicine

## 2018-08-15 DIAGNOSIS — E109 Type 1 diabetes mellitus without complications: Secondary | ICD-10-CM | POA: Insufficient documentation

## 2018-08-15 DIAGNOSIS — E871 Hypo-osmolality and hyponatremia: Secondary | ICD-10-CM

## 2018-08-15 DIAGNOSIS — R799 Abnormal finding of blood chemistry, unspecified: Secondary | ICD-10-CM | POA: Diagnosis present

## 2018-08-15 DIAGNOSIS — R197 Diarrhea, unspecified: Secondary | ICD-10-CM | POA: Diagnosis not present

## 2018-08-15 DIAGNOSIS — Z79899 Other long term (current) drug therapy: Secondary | ICD-10-CM | POA: Diagnosis not present

## 2018-08-15 LAB — URINALYSIS, COMPLETE (UACMP) WITH MICROSCOPIC
Bilirubin Urine: NEGATIVE
Glucose, UA: >=500 mg/dL — AB
HGB URINE DIPSTICK: NEGATIVE
KETONES UR: NEGATIVE mg/dL
Nitrite: NEGATIVE
PH: 7 (ref 5.0–8.0)
PROTEIN: NEGATIVE mg/dL
Specific Gravity, Urine: 1 — ABNORMAL LOW (ref 1.005–1.030)

## 2018-08-15 LAB — CBC
HEMATOCRIT: 37.4 % — AB (ref 40.0–52.0)
Hemoglobin: 13.3 g/dL (ref 13.0–18.0)
MCH: 34.1 pg — ABNORMAL HIGH (ref 26.0–34.0)
MCHC: 35.6 g/dL (ref 32.0–36.0)
MCV: 95.7 fL (ref 80.0–100.0)
Platelets: 236 10*3/uL (ref 150–440)
RBC: 3.91 MIL/uL — AB (ref 4.40–5.90)
RDW: 12.6 % (ref 11.5–14.5)
WBC: 6.1 10*3/uL (ref 3.8–10.6)

## 2018-08-15 LAB — SODIUM, URINE, RANDOM: SODIUM UR: 21 mmol/L

## 2018-08-15 LAB — BASIC METABOLIC PANEL
Anion gap: 10 (ref 5–15)
BUN: 6 mg/dL (ref 6–20)
CHLORIDE: 93 mmol/L — AB (ref 98–111)
CO2: 24 mmol/L (ref 22–32)
Calcium: 9 mg/dL (ref 8.9–10.3)
Creatinine, Ser: 0.54 mg/dL — ABNORMAL LOW (ref 0.61–1.24)
GFR calc non Af Amer: >60 mL/min (ref >60)
Glucose, Bld: 198 mg/dL — ABNORMAL HIGH (ref 70–99)
POTASSIUM: 3.9 mmol/L (ref 3.5–5.1)
SODIUM: 127 mmol/L — AB (ref 135–145)

## 2018-08-15 LAB — SODIUM: Sodium: 122 mEq/L — ABNORMAL LOW (ref 135–145)

## 2018-08-15 MED ORDER — SODIUM BICARBONATE 325 MG PO TABS: 325.0000 mg | ORAL_TABLET | Freq: Two times a day (BID) | ORAL | 1 refills | Status: AC

## 2018-08-15 NOTE — ED Notes (Addendum)
First Nurse Note:  Patient was sent to the ED by his PCP for sodium of 122.  Patient is paraplegic.
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
## 2018-08-15 NOTE — ED Triage Notes (Signed)
Here for NA recheck. Has had checked a few times at PCP and is low so sent to have rechecked and possibly fluids per pt. Feels fine, no pain or other complaints. Pt is on second round of abx for UTI; urine not rechecked but pt felt bad and asked pcp for another round of medicine.

## 2018-08-15 NOTE — ED Notes (Signed)
Pt to POV in WC, VSS, NAD, discharge instructions, RX and follow up given. All questions and concerns addressed.

## 2018-08-15 NOTE — Progress Notes (Signed)
Order placed for f/u sodium.

## 2018-08-16 ENCOUNTER — Other Ambulatory Visit: Payer: Self-pay | Admitting: Internal Medicine

## 2018-08-16 DIAGNOSIS — E871 Hypo-osmolality and hyponatremia: Secondary | ICD-10-CM

## 2018-08-16 LAB — CLOSTRIDIUM DIFFICILE TOXIN B, QUALITATIVE, REAL-TIME PCR: Toxigenic C. Difficile by PCR: NOT DETECTED

## 2018-08-16 NOTE — Progress Notes (Signed)
Order placed for f/u sodium check.

## 2018-08-17 LAB — URINE CULTURE

## 2018-08-19 ENCOUNTER — Other Ambulatory Visit (INDEPENDENT_AMBULATORY_CARE_PROVIDER_SITE_OTHER): Payer: Medicare Other

## 2018-08-19 ENCOUNTER — Encounter: Payer: Self-pay | Admitting: Internal Medicine

## 2018-08-19 DIAGNOSIS — E871 Hypo-osmolality and hyponatremia: Secondary | ICD-10-CM

## 2018-08-19 LAB — BASIC METABOLIC PANEL
BUN: 7 mg/dL (ref 6–23)
CHLORIDE: 93 meq/L — AB (ref 96–112)
CO2: 28 meq/L (ref 19–32)
CREATININE: 0.73 mg/dL (ref 0.40–1.50)
Calcium: 9.2 mg/dL (ref 8.4–10.5)
GFR: 121.66 mL/min (ref >60.00)
Glucose, Bld: 214 mg/dL — ABNORMAL HIGH (ref 70–99)
Potassium: 4.7 mEq/L (ref 3.5–5.1)
Sodium: 130 mEq/L — ABNORMAL LOW (ref 135–145)

## 2018-08-19 NOTE — Addendum Note (Signed)
Addended by: Leeanne Rio on: 08/19/2018 03:05 PM   Modules accepted: Orders

## 2018-10-12 ENCOUNTER — Encounter: Payer: Self-pay | Admitting: Internal Medicine

## 2018-10-13 NOTE — Telephone Encounter (Signed)
Would you like patient to be scheduled with you or send a referral to a specialist?

## 2018-10-24 DIAGNOSIS — M7581 Other shoulder lesions, right shoulder: Secondary | ICD-10-CM | POA: Diagnosis not present

## 2018-10-24 DIAGNOSIS — M65342 Trigger finger, left ring finger: Secondary | ICD-10-CM | POA: Diagnosis not present

## 2018-10-24 DIAGNOSIS — M65341 Trigger finger, right ring finger: Secondary | ICD-10-CM | POA: Diagnosis not present

## 2018-11-19 ENCOUNTER — Encounter: Payer: Self-pay | Admitting: Internal Medicine

## 2018-11-22 NOTE — Telephone Encounter (Signed)
Please call pt and let him know that I can place an order for endocrinology referral.  I am not sure we can get an appt that soon.  He has diabetes.  Need to confirm not having any new issues.

## 2018-11-22 NOTE — Telephone Encounter (Signed)
LMTCB

## 2018-12-20 NOTE — Telephone Encounter (Signed)
Patient would like you to call back when you return from lunch

## 2018-12-20 NOTE — Telephone Encounter (Signed)
Patient has changed his mind. He would like to discuss this with you at appt. He was wanting to see endocrinology to try to be put on a pump but would now like to discuss different way to check sugars. Advised that we could discuss at appt and apologized for the delay. We were missing each others calls for the last several weeks.

## 2018-12-20 NOTE — Telephone Encounter (Signed)
LMTCB

## 2018-12-22 ENCOUNTER — Ambulatory Visit (INDEPENDENT_AMBULATORY_CARE_PROVIDER_SITE_OTHER): Payer: Medicare Other | Admitting: Internal Medicine

## 2018-12-22 VITALS — BP 138/84 | HR 61 | Temp 97.5°F | Resp 18

## 2018-12-22 DIAGNOSIS — D649 Anemia, unspecified: Secondary | ICD-10-CM | POA: Diagnosis not present

## 2018-12-22 DIAGNOSIS — E871 Hypo-osmolality and hyponatremia: Secondary | ICD-10-CM

## 2018-12-22 DIAGNOSIS — Z23 Encounter for immunization: Secondary | ICD-10-CM | POA: Diagnosis not present

## 2018-12-22 DIAGNOSIS — G903 Multi-system degeneration of the autonomic nervous system: Secondary | ICD-10-CM

## 2018-12-22 DIAGNOSIS — F1010 Alcohol abuse, uncomplicated: Secondary | ICD-10-CM

## 2018-12-22 DIAGNOSIS — G8220 Paraplegia, unspecified: Secondary | ICD-10-CM

## 2018-12-22 DIAGNOSIS — E109 Type 1 diabetes mellitus without complications: Secondary | ICD-10-CM

## 2018-12-22 DIAGNOSIS — F339 Major depressive disorder, recurrent, unspecified: Secondary | ICD-10-CM

## 2018-12-22 DIAGNOSIS — Z87440 Personal history of urinary (tract) infections: Secondary | ICD-10-CM

## 2018-12-22 LAB — BASIC METABOLIC PANEL
BUN: 7 mg/dL (ref 6–23)
CALCIUM: 9.2 mg/dL (ref 8.4–10.5)
CO2: 31 mEq/L (ref 19–32)
Chloride: 96 mEq/L (ref 96–112)
Creatinine, Ser: 0.62 mg/dL (ref 0.40–1.50)
GFR: 138.02 mL/min (ref >60.00)
Glucose, Bld: 133 mg/dL — ABNORMAL HIGH (ref 70–99)
POTASSIUM: 5 meq/L (ref 3.5–5.1)
Sodium: 132 mEq/L — ABNORMAL LOW (ref 135–145)

## 2018-12-22 LAB — HEPATIC FUNCTION PANEL
ALT: 12 U/L (ref 0–53)
AST: 16 U/L (ref 0–37)
Albumin: 3.9 g/dL (ref 3.5–5.2)
Alkaline Phosphatase: 88 U/L (ref 39–117)
BILIRUBIN DIRECT: 0.1 mg/dL (ref 0.0–0.3)
BILIRUBIN TOTAL: 0.5 mg/dL (ref 0.2–1.2)
TOTAL PROTEIN: 6.4 g/dL (ref 6.0–8.3)

## 2018-12-22 LAB — HEMOGLOBIN A1C: Hgb A1c MFr Bld: 7 % — ABNORMAL HIGH (ref 4.6–6.5)

## 2018-12-25 ENCOUNTER — Encounter: Payer: Self-pay | Admitting: Internal Medicine

## 2018-12-25 DIAGNOSIS — E871 Hypo-osmolality and hyponatremia: Secondary | ICD-10-CM | POA: Insufficient documentation

## 2018-12-25 NOTE — Assessment & Plan Note (Signed)
Has decreased alcohol intake.  Follow.  Discussed with him today.

## 2018-12-25 NOTE — Assessment & Plan Note (Signed)
Concern over possible autonomic dysfunction.  Varying blood pressures as outlined.  Recheck today improved.  Having more problems with pressure dropping.  Previously saw cardiology.   With persistent low sodium and varying blood pressures, will have nephrology evaluated.

## 2018-12-25 NOTE — Assessment & Plan Note (Signed)
Persistent problem with low sodium.  Eating salt and taking salt substitutes.

## 2018-12-25 NOTE — Assessment & Plan Note (Signed)
Stable
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
## 2018-12-25 NOTE — Assessment & Plan Note (Signed)
Follow cbc.  

## 2018-12-25 NOTE — Assessment & Plan Note (Signed)
Followed by urology.  Has catheter.  No acute symptoms currently.

## 2018-12-25 NOTE — Assessment & Plan Note (Signed)
Discussed varying blood sugars.  Discussed eating more regularly.  Discussed possible insulin pump and continuous sensor.  Having problems with low sugars.  Interested in [place].  Refer to endocrinology.

## 2018-12-25 NOTE — Assessment & Plan Note (Signed)
Overall he feels he is handling things relatively well.  Follow.

## 2018-12-26 ENCOUNTER — Telehealth: Payer: Self-pay

## 2018-12-26 ENCOUNTER — Other Ambulatory Visit: Payer: Self-pay | Admitting: Internal Medicine

## 2018-12-26 ENCOUNTER — Telehealth: Payer: Self-pay | Admitting: *Deleted

## 2018-12-26 DIAGNOSIS — E875 Hyperkalemia: Secondary | ICD-10-CM

## 2018-12-26 MED ORDER — BLOOD GLUCOSE MONITOR KIT: PACK | 0 refills | Status: DC

## 2018-12-26 NOTE — Telephone Encounter (Signed)
Patient will need to visit the Dexcom web site to order the meter. I have advised patient of steps he will need to complete and once completed Dex[employer] will contact our office for physician order.

## 2018-12-26 NOTE — Progress Notes (Signed)
Order placed for f/u potassium.  

## 2018-12-26 NOTE — Telephone Encounter (Signed)
-----   Message from Einar Pheasant, MD sent at 12/25/2018  1:36 PM EST ----- Regarding: dexcom G6 (glucometer). Pt interested in seeing if insurance will cover Dexcom G6 sensor (continuous glucose monitor).  Pt is a Type I diabetic.  Requires insulin.  Continuing to have problems with low blood sugars.  Can you help Larena Glassman and me with helping him obtain this meter.  Thanks.  Dr Nicki Reaper

## 2018-12-26 NOTE — Telephone Encounter (Signed)
Copied from [place] [id number]. Topic: General - Other >> Dec 26, 2018  3:25 PM Yvette Rack wrote: Reason for CRM: Pt stated that he completed the order for the new meter on Dexcom web site.

## 2018-12-28 ENCOUNTER — Other Ambulatory Visit: Payer: Self-pay | Admitting: Surgery

## 2018-12-28 DIAGNOSIS — M7581 Other shoulder lesions, right shoulder: Secondary | ICD-10-CM

## 2018-12-29 ENCOUNTER — Other Ambulatory Visit (INDEPENDENT_AMBULATORY_CARE_PROVIDER_SITE_OTHER): Payer: Medicare Other

## 2018-12-29 DIAGNOSIS — E875 Hyperkalemia: Secondary | ICD-10-CM | POA: Diagnosis not present

## 2018-12-29 LAB — POTASSIUM: Potassium: 4.3 mmol/L (ref 3.5–5.3)

## 2018-12-29 NOTE — Telephone Encounter (Signed)
Left detailed message for patient letting him know that we are waiting for dexcom company to reach out to our office

## 2018-12-29 NOTE — Addendum Note (Signed)
Addended by: Arby Barrette on: 12/29/2018 03:06 PM   Modules accepted: Orders

## 2018-12-30 ENCOUNTER — Encounter: Payer: Self-pay | Admitting: Internal Medicine

## 2019-01-09 DIAGNOSIS — R310 Gross hematuria: Secondary | ICD-10-CM | POA: Diagnosis not present

## 2019-01-09 DIAGNOSIS — N390 Urinary tract infection, site not specified: Secondary | ICD-10-CM | POA: Diagnosis not present

## 2019-01-09 DIAGNOSIS — N200 Calculus of kidney: Secondary | ICD-10-CM | POA: Diagnosis not present

## 2019-01-10 DIAGNOSIS — N390 Urinary tract infection, site not specified: Secondary | ICD-10-CM | POA: Diagnosis not present

## 2019-01-10 DIAGNOSIS — R310 Gross hematuria: Secondary | ICD-10-CM | POA: Diagnosis not present

## 2019-01-10 DIAGNOSIS — N200 Calculus of kidney: Secondary | ICD-10-CM | POA: Diagnosis not present

## 2019-01-11 ENCOUNTER — Ambulatory Visit: Admission: RE | Admit: 2019-01-11 | Payer: Medicare Other | Source: Ambulatory Visit

## 2019-01-11 ENCOUNTER — Ambulatory Visit
Admission: RE | Admit: 2019-01-11 | Discharge: 2019-01-11 | Disposition: A | Discharge status: Home / Self Care | Payer: Medicare Other | Source: Ambulatory Visit | Attending: Surgery | Admitting: Surgery

## 2019-01-11 DIAGNOSIS — M25511 Pain in right shoulder: Secondary | ICD-10-CM | POA: Diagnosis not present

## 2019-01-11 DIAGNOSIS — M7581 Other shoulder lesions, right shoulder: Secondary | ICD-10-CM | POA: Insufficient documentation

## 2019-01-16 DIAGNOSIS — N200 Calculus of kidney: Secondary | ICD-10-CM | POA: Diagnosis not present

## 2019-01-19 ENCOUNTER — Telehealth: Payer: Self-pay

## 2019-01-19 NOTE — Telephone Encounter (Signed)
Called pt to let him know that I have faxed in the information for his dexcom meter

## 2019-01-20 ENCOUNTER — Encounter: Payer: Self-pay | Admitting: Internal Medicine

## 2019-01-27 ENCOUNTER — Telehealth: Payer: Self-pay | Admitting: Internal Medicine

## 2019-01-27 DIAGNOSIS — M7581 Other shoulder lesions, right shoulder: Secondary | ICD-10-CM | POA: Diagnosis not present

## 2019-01-27 NOTE — Telephone Encounter (Unsigned)
Copied from [place] [id number]. Topic: Quick Communication - Home Health Verbal Orders >> Jan 27, 2019  2:06 PM Jeri Cos wrote: Caller/Agency: [name] Number: [phone] Requesting OT/PT/Skilled Nursing/Social Work: Requesting certificate of medical necessity be changed from the [place] system to the [place] system. New form can be emailed to [email or web address] or faxed to [phone].

## 2019-01-30 ENCOUNTER — Encounter: Payer: Self-pay | Admitting: Internal Medicine

## 2019-01-30 NOTE — Telephone Encounter (Signed)
Tried to call [employer]. Number was busy.

## 2019-01-31 NOTE — Telephone Encounter (Signed)
Please call pt and inform him that he is seeing Dr Roland Rack for his shoulder.  They ordered MRI.  He needs to contact them about scheduling therapy.  I want to make sure with ortho that nothing is done that would be contraindicated.  See me before calling pt.

## 2019-01-31 NOTE — Telephone Encounter (Signed)
refaxed with new system

## 2019-02-07 DIAGNOSIS — M7581 Other shoulder lesions, right shoulder: Secondary | ICD-10-CM | POA: Diagnosis not present

## 2019-02-13 ENCOUNTER — Encounter: Payer: Self-pay | Admitting: Internal Medicine

## 2019-02-13 NOTE — Telephone Encounter (Signed)
[name] received CMN 01/25/2019 for Dexcom system - it is marked for the G5 but they are neeing to supply the G6 system. Please update and refax to [facility].   New form can be emailed to [email or web address] or faxed to [phone].

## 2019-02-15 NOTE — Telephone Encounter (Signed)
Unable to reach rep at [employer].

## 2019-02-16 DIAGNOSIS — I951 Orthostatic hypotension: Secondary | ICD-10-CM | POA: Diagnosis not present

## 2019-02-16 DIAGNOSIS — E871 Hypo-osmolality and hyponatremia: Secondary | ICD-10-CM | POA: Diagnosis not present

## 2019-02-22 NOTE — Telephone Encounter (Signed)
Called patient to let him know that I have filled out form again with different meter listed and would send as soon as Dr. Nicki Reaper signs

## 2019-02-23 DIAGNOSIS — E109 Type 1 diabetes mellitus without complications: Secondary | ICD-10-CM | POA: Diagnosis not present

## 2019-02-23 DIAGNOSIS — Z794 Long term (current) use of insulin: Secondary | ICD-10-CM | POA: Diagnosis not present

## 2019-03-10 DIAGNOSIS — N390 Urinary tract infection, site not specified: Secondary | ICD-10-CM | POA: Diagnosis not present

## 2019-03-10 DIAGNOSIS — G8220 Paraplegia, unspecified: Secondary | ICD-10-CM | POA: Diagnosis not present

## 2019-03-10 DIAGNOSIS — G904 Autonomic dysreflexia: Secondary | ICD-10-CM | POA: Diagnosis not present

## 2019-03-15 DIAGNOSIS — N390 Urinary tract infection, site not specified: Secondary | ICD-10-CM | POA: Diagnosis not present

## 2019-03-15 DIAGNOSIS — N200 Calculus of kidney: Secondary | ICD-10-CM | POA: Diagnosis not present

## 2019-03-16 DIAGNOSIS — N200 Calculus of kidney: Secondary | ICD-10-CM | POA: Diagnosis not present

## 2019-03-16 DIAGNOSIS — B390 Acute pulmonary histoplasmosis capsulati: Secondary | ICD-10-CM | POA: Diagnosis not present

## 2019-03-16 DIAGNOSIS — N390 Urinary tract infection, site not specified: Secondary | ICD-10-CM | POA: Diagnosis not present

## 2019-03-16 DIAGNOSIS — G8220 Paraplegia, unspecified: Secondary | ICD-10-CM | POA: Diagnosis not present

## 2019-03-22 ENCOUNTER — Telehealth: Payer: Self-pay | Admitting: Lab

## 2019-03-22 DIAGNOSIS — N390 Urinary tract infection, site not specified: Secondary | ICD-10-CM | POA: Diagnosis not present

## 2019-03-22 NOTE — Telephone Encounter (Signed)
Called Pt to see if he wanted a virtual or phone visit appt this week, because we had openings.

## 2019-03-22 NOTE — Telephone Encounter (Signed)
Called Pt to tell him we had open Virtual & Phone visits available with Dr. Nicki Reaper if he wanted a visit this week instead of 04/07/19.

## 2019-03-24 ENCOUNTER — Encounter: Payer: Self-pay | Admitting: Internal Medicine

## 2019-03-24 NOTE — Telephone Encounter (Signed)
Please call pt and confirm no increased sob, etc.  Agree with increased sodium could be contributing to the swelling.  Continue to elevate legs.  Try to decrease sodium intake.  Confirm not drinking an excessive amount of free water and alcohol.  Continue baclofen.  May need labs - to confirm if potassium, sodium, magnesium ok.  This could be contributing to cramps.  If any acute change or worsening problems, needs evaluation.

## 2019-03-24 NOTE — Telephone Encounter (Signed)
Called pt to get more info. He is complaining of increased swelling of his lower extremities and muscle spasms in his back and legs. He has been treated for about 3 weeks for UTI. First with cipro, macrobid and now tetracycline. He does not think this Is related to the abx. He is taking Baclofen TID and sometimes an extra one at night but still no relief with muscle spasms. Also he has noticed that both of his legs have been swelling. It is worse after being up in his wheelchair all day. Was getting better with elevation but now he says they are staying about the same. He confirmed no redness, warmth, etc. He also noted that he has had to increase his sodium intake recently and was thinking that could be what is causing the swelling. Advised that I would send message over but also advised that he may need a visit with you. He says other wise he is feeling ok. No other acute symptoms noted.

## 2019-03-24 NOTE — Telephone Encounter (Signed)
Pt aware. He is going to do everything listed below. Confirmed no acute SOB, etc. Advised to be evaluated if worsens over the weekend. Stated he is not drinking a lot of free water or alcohol. He is going to call with update on Monday and I will schedule him for labs and visit with Dr. Nicki Reaper.

## 2019-03-26 ENCOUNTER — Encounter: Payer: Self-pay | Admitting: Internal Medicine

## 2019-03-27 ENCOUNTER — Other Ambulatory Visit (INDEPENDENT_AMBULATORY_CARE_PROVIDER_SITE_OTHER): Payer: Medicare Other

## 2019-03-27 ENCOUNTER — Other Ambulatory Visit: Payer: Self-pay

## 2019-03-27 ENCOUNTER — Telehealth: Payer: Self-pay

## 2019-03-27 DIAGNOSIS — E109 Type 1 diabetes mellitus without complications: Secondary | ICD-10-CM

## 2019-03-27 DIAGNOSIS — M7989 Other specified soft tissue disorders: Secondary | ICD-10-CM

## 2019-03-27 DIAGNOSIS — R252 Cramp and spasm: Secondary | ICD-10-CM | POA: Diagnosis not present

## 2019-03-27 DIAGNOSIS — E871 Hypo-osmolality and hyponatremia: Secondary | ICD-10-CM

## 2019-03-27 NOTE — Telephone Encounter (Signed)
Spoke with pt about coming in for labs today. You mentioned on Friday that he would need his sodium, potassium and magnesium checked. I have ordered a BMP, Hepatic, and magnesium. He is scheduled for 2:30 today. Is there anything else you want to add?

## 2019-03-27 NOTE — Telephone Encounter (Signed)
Orders placed for labs.  It appears that he has a telephone appt tomorrow.  Need to confirm doing ok.

## 2019-03-27 NOTE — Telephone Encounter (Signed)
Pt was okay to wait until tomorrow for appt. Advised if anything needed to be addressed prior to appt I would let him know. He was okay with that.

## 2019-03-28 ENCOUNTER — Ambulatory Visit (INDEPENDENT_AMBULATORY_CARE_PROVIDER_SITE_OTHER): Payer: Medicare Other | Admitting: Internal Medicine

## 2019-03-28 ENCOUNTER — Encounter: Payer: Self-pay | Admitting: Internal Medicine

## 2019-03-28 DIAGNOSIS — R609 Edema, unspecified: Secondary | ICD-10-CM

## 2019-03-28 DIAGNOSIS — Z87440 Personal history of urinary (tract) infections: Secondary | ICD-10-CM | POA: Diagnosis not present

## 2019-03-28 DIAGNOSIS — E871 Hypo-osmolality and hyponatremia: Secondary | ICD-10-CM

## 2019-03-28 DIAGNOSIS — D649 Anemia, unspecified: Secondary | ICD-10-CM

## 2019-03-28 DIAGNOSIS — R252 Cramp and spasm: Secondary | ICD-10-CM

## 2019-03-28 DIAGNOSIS — F1010 Alcohol abuse, uncomplicated: Secondary | ICD-10-CM

## 2019-03-28 DIAGNOSIS — E109 Type 1 diabetes mellitus without complications: Secondary | ICD-10-CM

## 2019-03-28 LAB — CBC WITH DIFFERENTIAL/PLATELET
Basophils Absolute: 0 10*3/uL (ref 0.0–0.1)
Basophils Relative: 0.8 % (ref 0.0–3.0)
Eosinophils Absolute: 0 10*3/uL (ref 0.0–0.7)
Eosinophils Relative: 0.6 % (ref 0.0–5.0)
HCT: 40.4 % (ref 39.0–52.0)
Hemoglobin: 13.9 g/dL (ref 13.0–17.0)
Lymphocytes Relative: 13.3 % (ref 12.0–46.0)
Lymphs Abs: 0.7 10*3/uL (ref 0.7–4.0)
MCHC: 34.4 g/dL (ref 30.0–36.0)
MCV: 97.1 fl (ref 78.0–100.0)
Monocytes Absolute: 0.4 10*3/uL (ref 0.1–1.0)
Monocytes Relative: 7.6 % (ref 3.0–12.0)
Neutro Abs: 4.2 10*3/uL (ref 1.4–7.7)
Neutrophils Relative %: 77.7 % — ABNORMAL HIGH (ref 43.0–77.0)
Platelets: 223 10*3/uL (ref 150.0–400.0)
RBC: 4.16 Mil/uL — ABNORMAL LOW (ref 4.22–5.81)
RDW: 13.2 % (ref 11.5–15.5)
WBC: 5.4 10*3/uL (ref 4.0–10.5)

## 2019-03-28 LAB — HEPATIC FUNCTION PANEL
ALT: 20 U/L (ref 0–53)
AST: 29 U/L (ref 0–37)
Albumin: 3.5 g/dL (ref 3.5–5.2)
Alkaline Phosphatase: 94 U/L (ref 39–117)
Bilirubin, Direct: 0.1 mg/dL (ref 0.0–0.3)
Total Bilirubin: 0.9 mg/dL (ref 0.2–1.2)
Total Protein: 5.5 g/dL — ABNORMAL LOW (ref 6.0–8.3)

## 2019-03-28 LAB — BASIC METABOLIC PANEL
BUN: 17 mg/dL (ref 6–23)
CO2: 30 mEq/L (ref 19–32)
Calcium: 8.9 mg/dL (ref 8.4–10.5)
Chloride: 95 mEq/L — ABNORMAL LOW (ref 96–112)
Creatinine, Ser: 0.66 mg/dL (ref 0.40–1.50)
GFR: 128.27 mL/min (ref >60.00)
Glucose, Bld: 155 mg/dL — ABNORMAL HIGH (ref 70–99)
Potassium: 4.1 mEq/L (ref 3.5–5.1)
Sodium: 132 mEq/L — ABNORMAL LOW (ref 135–145)

## 2019-03-28 LAB — FERRITIN: Ferritin: 35.1 ng/mL (ref 22.0–322.0)

## 2019-03-28 LAB — HEMOGLOBIN A1C: Hgb A1c MFr Bld: 6.4 % (ref 4.6–6.5)

## 2019-03-28 LAB — TSH: TSH: 1.21 u[IU]/mL (ref 0.35–4.50)

## 2019-03-28 LAB — MAGNESIUM: Magnesium: 1.6 mg/dL (ref 1.5–2.5)
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
## 2019-03-29 MED ORDER — MAGNESIUM OXIDE -MG SUPPLEMENT 400 (240 MG) MG PO TABS: ORAL_TABLET | ORAL | 1 refills | Status: DC

## 2019-03-29 NOTE — Telephone Encounter (Signed)
I am not sure what was supposed to be sent in?

## 2019-03-29 NOTE — Telephone Encounter (Signed)
rx sent in for mag oxide.

## 2019-04-01 ENCOUNTER — Encounter: Payer: Self-pay | Admitting: Internal Medicine

## 2019-04-01 DIAGNOSIS — R252 Cramp and spasm: Secondary | ICD-10-CM | POA: Insufficient documentation

## 2019-04-01 NOTE — Assessment & Plan Note (Signed)
Just check hgb and wnl.  Follow.

## 2019-04-01 NOTE — Assessment & Plan Note (Signed)
Recent sodium check 132.  Follow.

## 2019-04-01 NOTE — Assessment & Plan Note (Signed)
Discussed the need to decrease alcohol intake.

## 2019-04-01 NOTE — Assessment & Plan Note (Signed)
Has dexcom - sensor. Doing well.  No significant low sugars.  a1c improved.  Follow.

## 2019-04-01 NOTE — Assessment & Plan Note (Signed)
Followed by urology.  Just completed third round of abx.  Feels symptoms have resolved.  Follow.

## 2019-04-01 NOTE — Assessment & Plan Note (Signed)
Reports increased lower extremity swelling.  Sodium improved.  Decrease salt intake.  Elevate legs if possible.

## 2019-04-01 NOTE — Assessment & Plan Note (Signed)
Increased cramping as outlined.  Low magnesium.  Start mag oxide.  Follow.

## 2019-04-04 ENCOUNTER — Encounter: Payer: Self-pay | Admitting: Internal Medicine

## 2019-04-07 ENCOUNTER — Encounter: Payer: Self-pay | Admitting: Internal Medicine

## 2019-04-07 ENCOUNTER — Ambulatory Visit: Payer: Medicare Other | Admitting: Internal Medicine

## 2019-04-25 DIAGNOSIS — M7581 Other shoulder lesions, right shoulder: Secondary | ICD-10-CM | POA: Diagnosis not present

## 2019-04-25 DIAGNOSIS — M7582 Other shoulder lesions, left shoulder: Secondary | ICD-10-CM | POA: Diagnosis not present

## 2019-04-25 DIAGNOSIS — M65342 Trigger finger, left ring finger: Secondary | ICD-10-CM | POA: Diagnosis not present

## 2019-05-24 ENCOUNTER — Encounter: Payer: Self-pay | Admitting: Internal Medicine

## 2019-05-24 ENCOUNTER — Other Ambulatory Visit: Payer: Self-pay | Admitting: Internal Medicine

## 2019-05-24 DIAGNOSIS — E109 Type 1 diabetes mellitus without complications: Secondary | ICD-10-CM | POA: Diagnosis not present

## 2019-05-24 DIAGNOSIS — Z794 Long term (current) use of insulin: Secondary | ICD-10-CM | POA: Diagnosis not present

## 2019-05-25 NOTE — Telephone Encounter (Signed)
Last fill led for 180 with 5 refills last OV 4/20

## 2019-05-25 NOTE — Telephone Encounter (Signed)
rx ok'd for refill baclofen.

## 2019-06-09 ENCOUNTER — Encounter
Admission: RE | Admit: 2019-06-09 | Discharge: 2019-06-09 | Disposition: A | Discharge status: Home / Self Care | Payer: Medicare Other | Source: Ambulatory Visit | Attending: Surgery | Admitting: Surgery

## 2019-06-09 ENCOUNTER — Other Ambulatory Visit: Payer: Self-pay

## 2019-06-09 HISTORY — DX: Gastro-esophageal reflux disease without esophagitis: K21.9

## 2019-06-09 HISTORY — DX: Personal history of other diseases of the digestive system: Z87.19

## 2019-06-09 HISTORY — DX: Unspecified osteoarthritis, unspecified site: M19.90

## 2019-06-09 NOTE — Patient Instructions (Signed)
Your procedure is scheduled on: 06-15-19 THURSDAY Report to Same Day Surgery 2nd floor medical mall [facility] Entrance-take elevator on left to 2nd floor.  Check in with surgery information desk.) To find out your arrival time please call [phone] between 1PM - 3PM on 06-14-19 [facility]  Remember: Instructions that are not followed completely may result in serious medical risk, up to and including death, or upon the discretion of your surgeon and anesthesiologist your surgery may need to be rescheduled.    _x___ 1. Do not eat food after midnight the night before your procedure. You may drink WATER up to 2 hours before you are scheduled to arrive at the hospital for your procedure.  Do not drink WATER within 2 hours of your scheduled arrival to the hospital.  Type 1 and type 2 diabetics should only drink water.   ____Ensure clear carbohydrate drink on the way to the hospital for bariatric patients  ____Ensure clear carbohydrate drink 3 hours before surgery for Dr Dwyane Luo patients if physician instructed.    __x__ 2. No Alcohol for 24 hours before or after surgery.   __x__3. No Smoking or e-cigarettes for 24 prior to surgery.  Do not use any chewable tobacco products for at least 6 hour prior to surgery   ____  4. Bring all medications with you on the day of surgery if instructed.    __x__ 5. Notify your doctor if there is any change in your medical condition     (cold, fever, infections).    x___6. On the morning of surgery brush your teeth with toothpaste and water.  You may rinse your mouth with mouth wash if you wish.  Do not swallow any toothpaste or mouthwash.   Do not wear jewelry, make-up, hairpins, clips or nail polish.  Do not wear lotions, powders, or perfumes. You may wear deodorant.  Do not shave 48 hours prior to surgery. Men may shave face and neck.  Do not bring valuables to the hospital.    [facility] is not responsible for any belongings or valuables.           Contacts, dentures or bridgework may not be worn into surgery.  Leave your suitcase in the car. After surgery it may be brought to your room.  For patients admitted to the hospital, discharge time is determined by your treatment team.  _  Patients discharged the day of surgery will not be allowed to drive home.  You will need someone to drive you home and stay with you the night of your procedure.    Please read over the following fact sheets that you were given:   [facility] Preparing for Surgery   _x___ TAKE THE FOLLOWING MEDICATION THE MORNING OF SURGERY WITH A SMALL SIP OF WATER. These include:  1. BACLOFEN  2. MAGNESIUM  3. PRILOSEC (OMEPRAZOLE)  4. TAKE AN OMEPRAZOLE THE NIGHT BEFORE SURGERY  5. YOU MAY TAKE YOUR FLUDROCORTISONE AM OF SURGERY IF YOUR BLOOD PRESSURE IS LOW-IF IT IS NOT, THEN BRING  THIS BOTTLE WITH YOU TO THE HOSPITAL   6.  ____Fleets enema or Magnesium Citrate as directed.   _x___ Use CHG Soap or sage wipes as directed on instruction sheet   ____ Use inhalers on the day of surgery and bring to hospital day of surgery  ____ Stop Metformin and Janumet 2 days prior to surgery.    _X___ Take 1/2 of usual insulin dose the night before surgery and none on the  morning surgery-TAKE HALF OF YOUR LEVEMIR WEDNESDAY NIGHT AND NO INSULIN AM OF SURGERY  _x___ Follow recommendations from Cardiologist, Pulmonologist or PCP regarding stopping Aspirin, Coumadin, Plavix ,Eliquis, Effient, or Pradaxa, and Pletal.  X____Stop Anti-inflammatories such as Advil, Aleve, Ibuprofen, Motrin, Naproxen, Naprosyn, Goodies powders or aspirin products NOW-OK to take Tylenol   ____ Stop supplements until after surgery.    ____ Bring C-Pap to the hospital.

## 2019-06-12 ENCOUNTER — Encounter
Admission: RE | Admit: 2019-06-12 | Discharge: 2019-06-12 | Disposition: A | Discharge status: Home / Self Care | Payer: Medicare Other | Source: Ambulatory Visit | Attending: Surgery | Admitting: Surgery

## 2019-06-12 ENCOUNTER — Other Ambulatory Visit: Admission: RE | Admit: 2019-06-12 | Payer: Medicare Other | Source: Ambulatory Visit

## 2019-06-12 ENCOUNTER — Other Ambulatory Visit: Payer: Self-pay

## 2019-06-12 DIAGNOSIS — M75111 Incomplete rotator cuff tear or rupture of right shoulder, not specified as traumatic: Secondary | ICD-10-CM | POA: Diagnosis not present

## 2019-06-12 DIAGNOSIS — G8220 Paraplegia, unspecified: Secondary | ICD-10-CM | POA: Diagnosis not present

## 2019-06-12 DIAGNOSIS — Z882 Allergy status to sulfonamides status: Secondary | ICD-10-CM | POA: Diagnosis not present

## 2019-06-12 DIAGNOSIS — Z79899 Other long term (current) drug therapy: Secondary | ICD-10-CM | POA: Diagnosis not present

## 2019-06-12 DIAGNOSIS — Z794 Long term (current) use of insulin: Secondary | ICD-10-CM | POA: Diagnosis not present

## 2019-06-12 DIAGNOSIS — M7521 Bicipital tendinitis, right shoulder: Secondary | ICD-10-CM | POA: Diagnosis not present

## 2019-06-12 DIAGNOSIS — K219 Gastro-esophageal reflux disease without esophagitis: Secondary | ICD-10-CM | POA: Diagnosis not present

## 2019-06-12 DIAGNOSIS — Z888 Allergy status to other drugs, medicaments and biological substances status: Secondary | ICD-10-CM | POA: Diagnosis not present

## 2019-06-12 DIAGNOSIS — K449 Diaphragmatic hernia without obstruction or gangrene: Secondary | ICD-10-CM | POA: Diagnosis not present

## 2019-06-12 DIAGNOSIS — Z981 Arthrodesis status: Secondary | ICD-10-CM | POA: Diagnosis not present

## 2019-06-12 DIAGNOSIS — Z1159 Encounter for screening for other viral diseases: Secondary | ICD-10-CM | POA: Diagnosis not present

## 2019-06-12 DIAGNOSIS — Z91041 Radiographic dye allergy status: Secondary | ICD-10-CM | POA: Diagnosis not present

## 2019-06-12 DIAGNOSIS — E109 Type 1 diabetes mellitus without complications: Secondary | ICD-10-CM | POA: Diagnosis not present

## 2019-06-12 DIAGNOSIS — M1990 Unspecified osteoarthritis, unspecified site: Secondary | ICD-10-CM | POA: Diagnosis not present

## 2019-06-12 DIAGNOSIS — Z01810 Encounter for preprocedural cardiovascular examination: Secondary | ICD-10-CM | POA: Diagnosis not present

## 2019-06-12 LAB — BASIC METABOLIC PANEL
Anion gap: 7 (ref 5–15)
BUN: 9 mg/dL (ref 6–20)
CO2: 29 mmol/L (ref 22–32)
Calcium: 8.7 mg/dL — ABNORMAL LOW (ref 8.9–10.3)
Chloride: 98 mmol/L (ref 98–111)
Creatinine, Ser: 0.57 mg/dL — ABNORMAL LOW (ref 0.61–1.24)
GFR calc Af Amer: >60 mL/min (ref >60)
GFR calc non Af Amer: >60 mL/min (ref >60)
Glucose, Bld: 210 mg/dL — ABNORMAL HIGH (ref 70–99)
Potassium: 4.9 mmol/L (ref 3.5–5.1)
Sodium: 134 mmol/L — ABNORMAL LOW (ref 135–145)

## 2019-06-12 LAB — SARS CORONAVIRUS 2 (TAT 6-24 HRS): SARS Coronavirus 2: NEGATIVE

## 2019-06-14 ENCOUNTER — Encounter: Payer: Self-pay | Admitting: *Deleted

## 2019-06-14 MED ORDER — CEFAZOLIN SODIUM-DEXTROSE 1-4 GM/50ML-% IV SOLN
1.0000 g | Freq: Once | INTRAVENOUS | Status: AC
  Administered 2019-06-15: 1 g via INTRAVENOUS
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
## 2019-06-15 ENCOUNTER — Encounter: Payer: Self-pay | Admitting: Emergency Medicine

## 2019-06-15 ENCOUNTER — Ambulatory Visit: Payer: Medicare Other | Admitting: Certified Registered"

## 2019-06-15 ENCOUNTER — Ambulatory Visit
Admission: RE | Admit: 2019-06-15 | Discharge: 2019-06-15 | Disposition: A | Discharge status: Home / Self Care | Payer: Medicare Other | Attending: Surgery | Admitting: Surgery

## 2019-06-15 ENCOUNTER — Encounter
Admission: RE | Disposition: A | Discharge status: Home / Self Care | Payer: Self-pay | Source: Home / Self Care | Attending: Surgery

## 2019-06-15 DIAGNOSIS — Z91041 Radiographic dye allergy status: Secondary | ICD-10-CM | POA: Diagnosis not present

## 2019-06-15 DIAGNOSIS — M24111 Other articular cartilage disorders, right shoulder: Secondary | ICD-10-CM | POA: Diagnosis not present

## 2019-06-15 DIAGNOSIS — M75111 Incomplete rotator cuff tear or rupture of right shoulder, not specified as traumatic: Secondary | ICD-10-CM | POA: Insufficient documentation

## 2019-06-15 DIAGNOSIS — K449 Diaphragmatic hernia without obstruction or gangrene: Secondary | ICD-10-CM | POA: Diagnosis not present

## 2019-06-15 DIAGNOSIS — E109 Type 1 diabetes mellitus without complications: Secondary | ICD-10-CM | POA: Diagnosis not present

## 2019-06-15 DIAGNOSIS — S46101A Unspecified injury of muscle, fascia and tendon of long head of biceps, right arm, initial encounter: Secondary | ICD-10-CM | POA: Insufficient documentation

## 2019-06-15 DIAGNOSIS — Z794 Long term (current) use of insulin: Secondary | ICD-10-CM | POA: Insufficient documentation

## 2019-06-15 DIAGNOSIS — Z1159 Encounter for screening for other viral diseases: Secondary | ICD-10-CM | POA: Diagnosis not present

## 2019-06-15 DIAGNOSIS — M7521 Bicipital tendinitis, right shoulder: Secondary | ICD-10-CM | POA: Insufficient documentation

## 2019-06-15 DIAGNOSIS — M1990 Unspecified osteoarthritis, unspecified site: Secondary | ICD-10-CM | POA: Diagnosis not present

## 2019-06-15 DIAGNOSIS — Z888 Allergy status to other drugs, medicaments and biological substances status: Secondary | ICD-10-CM | POA: Insufficient documentation

## 2019-06-15 DIAGNOSIS — K219 Gastro-esophageal reflux disease without esophagitis: Secondary | ICD-10-CM | POA: Insufficient documentation

## 2019-06-15 DIAGNOSIS — Z79899 Other long term (current) drug therapy: Secondary | ICD-10-CM | POA: Diagnosis not present

## 2019-06-15 DIAGNOSIS — Z981 Arthrodesis status: Secondary | ICD-10-CM | POA: Insufficient documentation

## 2019-06-15 DIAGNOSIS — G8220 Paraplegia, unspecified: Secondary | ICD-10-CM | POA: Diagnosis not present

## 2019-06-15 DIAGNOSIS — Z882 Allergy status to sulfonamides status: Secondary | ICD-10-CM | POA: Diagnosis not present

## 2019-06-15 DIAGNOSIS — E119 Type 2 diabetes mellitus without complications: Secondary | ICD-10-CM | POA: Diagnosis not present

## 2019-06-15 DIAGNOSIS — I10 Essential (primary) hypertension: Secondary | ICD-10-CM | POA: Diagnosis not present

## 2019-06-15 DIAGNOSIS — M7581 Other shoulder lesions, right shoulder: Secondary | ICD-10-CM | POA: Diagnosis not present

## 2019-06-15 HISTORY — PX: SHOULDER ARTHROSCOPY: SHX128

## 2019-06-15 LAB — URINE DRUG SCREEN, QUALITATIVE (ARMC ONLY)
Amphetamines, Ur Screen: NOT DETECTED
Barbiturates, Ur Screen: NOT DETECTED
Benzodiazepine, Ur Scrn: NOT DETECTED
Cannabinoid 50 Ng, Ur ~~LOC~~: POSITIVE — AB
Cocaine Metabolite,Ur ~~LOC~~: NOT DETECTED
MDMA (Ecstasy)Ur Screen: NOT DETECTED
Methadone Scn, Ur: NOT DETECTED
Opiate, Ur Screen: NOT DETECTED
Phencyclidine (PCP) Ur S: NOT DETECTED
Tricyclic, Ur Screen: NOT DETECTED

## 2019-06-15 LAB — GLUCOSE, CAPILLARY
Glucose-Capillary: 222 mg/dL — ABNORMAL HIGH (ref 70–99)
Glucose-Capillary: 295 mg/dL — ABNORMAL HIGH (ref 70–99)
Glucose-Capillary: 278 mg/dL — ABNORMAL HIGH (ref 70–99)

## 2019-06-15 SURGERY — ARTHROSCOPY, SHOULDER
Anesthesia: General | Laterality: Right

## 2019-06-15 MED ORDER — LACTATED RINGERS IV SOLN
INTRAVENOUS | Status: DC | PRN
  Administered 2019-06-15: 10:00:00 via INTRAVENOUS

## 2019-06-15 MED ORDER — FENTANYL CITRATE (PF) 100 MCG/2ML IJ SOLN
INTRAMUSCULAR | Status: AC
  Administered 2019-06-15: 50 ug via INTRAVENOUS
  Filled 2019-06-15: qty 2

## 2019-06-15 MED ORDER — INSULIN ASPART 100 UNIT/ML ~~LOC~~ SOLN
SUBCUTANEOUS | Status: AC
  Administered 2019-06-15: 4 [IU] via SUBCUTANEOUS
  Filled 2019-06-15: qty 1

## 2019-06-15 MED ORDER — HYDROCODONE-ACETAMINOPHEN 5-325 MG PO TABS
ORAL_TABLET | ORAL | Status: AC
  Filled 2019-06-15: qty 1

## 2019-06-15 MED ORDER — EPINEPHRINE PF 1 MG/ML IJ SOLN
INTRAMUSCULAR | Status: DC | PRN
  Administered 2019-06-15: 1 mg

## 2019-06-15 MED ORDER — HYDROCODONE-ACETAMINOPHEN 5-325 MG PO TABS
2.0000 | ORAL_TABLET | Freq: Once | ORAL | Status: AC
  Administered 2019-06-15: 2 via ORAL

## 2019-06-15 MED ORDER — SUGAMMADEX SODIUM 200 MG/2ML IV SOLN
INTRAVENOUS | Status: DC | PRN
  Administered 2019-06-15: 153.4 mg via INTRAVENOUS

## 2019-06-15 MED ORDER — PROMETHAZINE HCL 25 MG/ML IJ SOLN: 6.2500 mg | INTRAMUSCULAR | Status: DC | PRN

## 2019-06-15 MED ORDER — LABETALOL HCL 5 MG/ML IV SOLN
INTRAVENOUS | Status: AC
  Administered 2019-06-15: 10:00:00 2.5 mg via INTRAVENOUS
  Filled 2019-06-15: qty 4

## 2019-06-15 MED ORDER — CEFAZOLIN SODIUM-DEXTROSE 1-4 GM/50ML-% IV SOLN
INTRAVENOUS | Status: AC
  Filled 2019-06-15: qty 50

## 2019-06-15 MED ORDER — PROPOFOL 10 MG/ML IV BOLUS
INTRAVENOUS | Status: DC | PRN
  Administered 2019-06-15: 150 mg via INTRAVENOUS

## 2019-06-15 MED ORDER — INSULIN REGULAR HUMAN 100 UNIT/ML IJ SOLN: 6.0000 [IU] | Freq: Once | INTRAMUSCULAR | Status: DC

## 2019-06-15 MED ORDER — HYDROCODONE-ACETAMINOPHEN 7.5-325 MG PO TABS: 1.0000 | ORAL_TABLET | Freq: Once | ORAL | Status: DC | PRN

## 2019-06-15 MED ORDER — ONDANSETRON HCL 4 MG/2ML IJ SOLN
INTRAMUSCULAR | Status: DC | PRN
  Administered 2019-06-15: 4 mg via INTRAVENOUS

## 2019-06-15 MED ORDER — MIDAZOLAM HCL 2 MG/2ML IJ SOLN
INTRAMUSCULAR | Status: DC | PRN
  Administered 2019-06-15: 2 mg via INTRAVENOUS

## 2019-06-15 MED ORDER — BUPIVACAINE-EPINEPHRINE (PF) 0.5% -1:200000 IJ SOLN
INTRAMUSCULAR | Status: DC | PRN
  Administered 2019-06-15: 30 mL

## 2019-06-15 MED ORDER — LIDOCAINE HCL (CARDIAC) PF 100 MG/5ML IV SOSY
PREFILLED_SYRINGE | INTRAVENOUS | Status: DC | PRN
  Administered 2019-06-15: 80 mg via INTRAVENOUS

## 2019-06-15 MED ORDER — ACETAMINOPHEN 325 MG PO TABS: 325.0000 mg | ORAL_TABLET | ORAL | Status: DC | PRN

## 2019-06-15 MED ORDER — LABETALOL HCL 5 MG/ML IV SOLN
2.5000 mg | Freq: Once | INTRAVENOUS | Status: AC
  Administered 2019-06-15: 10:00:00 2.5 mg via INTRAVENOUS
  Filled 2019-06-15: qty 4

## 2019-06-15 MED ORDER — ACETAMINOPHEN 160 MG/5ML PO SOLN
325.0000 mg | ORAL | Status: DC | PRN
  Filled 2019-06-15: qty 20.3

## 2019-06-15 MED ORDER — FENTANYL CITRATE (PF) 100 MCG/2ML IJ SOLN
25.0000 ug | INTRAMUSCULAR | Status: DC | PRN
  Administered 2019-06-15 (×3): 50 ug via INTRAVENOUS

## 2019-06-15 MED ORDER — MEPERIDINE HCL 50 MG/ML IJ SOLN: 6.2500 mg | INTRAMUSCULAR | Status: DC | PRN

## 2019-06-15 MED ORDER — SODIUM CHLORIDE 0.9 % IV SOLN
INTRAVENOUS | Status: DC
  Administered 2019-06-15: 10:00:00 via INTRAVENOUS

## 2019-06-15 MED ORDER — FENTANYL CITRATE (PF) 100 MCG/2ML IJ SOLN
INTRAMUSCULAR | Status: DC | PRN
  Administered 2019-06-15: 100 ug via INTRAVENOUS
  Administered 2019-06-15: 75 ug via INTRAVENOUS
  Administered 2019-06-15: 50 ug via INTRAVENOUS

## 2019-06-15 MED ORDER — INSULIN ASPART 100 UNIT/ML ~~LOC~~ SOLN
4.0000 [IU] | Freq: Once | SUBCUTANEOUS | Status: AC
  Administered 2019-06-15: 12:00:00 4 [IU] via SUBCUTANEOUS

## 2019-06-15 MED ORDER — ROCURONIUM BROMIDE 100 MG/10ML IV SOLN
INTRAVENOUS | Status: DC | PRN
  Administered 2019-06-15: 50 mg via INTRAVENOUS

## 2019-06-15 MED ORDER — LABETALOL HCL 5 MG/ML IV SOLN
2.5000 mg | Freq: Once | INTRAVENOUS | Status: AC
  Administered 2019-06-15: 2.5 mg via INTRAVENOUS
  Filled 2019-06-15: qty 4

## 2019-06-15 MED ORDER — HYDROCODONE-ACETAMINOPHEN 5-325 MG PO TABS: 2.0000 | ORAL_TABLET | Freq: Four times a day (QID) | ORAL | 0 refills | Status: DC | PRN

## 2019-06-15 MED ORDER — PHENYLEPHRINE HCL (PRESSORS) 10 MG/ML IV SOLN
INTRAVENOUS | Status: DC | PRN
  Administered 2019-06-15: 100 ug via INTRAVENOUS
  Administered 2019-06-15: 150 ug via INTRAVENOUS

## 2019-06-15 MED ORDER — EPHEDRINE SULFATE 50 MG/ML IJ SOLN
INTRAMUSCULAR | Status: DC | PRN
  Administered 2019-06-15 (×2): 5 mg via INTRAVENOUS

## 2019-06-15 SURGICAL SUPPLY — 43 items
APL PRP STRL LF DISP 70% ISPRP (MISCELLANEOUS) ×1
BIT DRILL JUGRKNT W/NDL BIT2.9 (DRILL) IMPLANT
BLADE FULL RADIUS 3.5 (BLADE) ×2 IMPLANT
BUR ACROMIONIZER 4.0 (BURR) ×2 IMPLANT
CANNULA SHAVER 8MMX76MM (CANNULA) ×2 IMPLANT
CHLORAPREP W/TINT 26 (MISCELLANEOUS) ×2 IMPLANT
COVER MAYO STAND STRL (DRAPES) ×2 IMPLANT
COVER WAND RF STERILE (DRAPES) ×2 IMPLANT
DRAPE IMP U-DRAPE 54X76 (DRAPES) ×4 IMPLANT
DRILL JUGGERKNOT W/NDL BIT 2.9 (DRILL)
DRSG OPSITE POSTOP 3X4 (GAUZE/BANDAGES/DRESSINGS) ×1 IMPLANT
ELECT REM PT RETURN 9FT ADLT (ELECTROSURGICAL) ×2
ELECTRODE REM PT RTRN 9FT ADLT (ELECTROSURGICAL) ×1 IMPLANT
GAUZE SPONGE 4X4 12PLY STRL (GAUZE/BANDAGES/DRESSINGS) ×1 IMPLANT
GAUZE XEROFORM 1X8 LF (GAUZE/BANDAGES/DRESSINGS) ×2 IMPLANT
GLOVE BIO SURGEON STRL SZ7.5 (GLOVE) ×4 IMPLANT
GLOVE BIO SURGEON STRL SZ8 (GLOVE) ×4 IMPLANT
GLOVE BIOGEL PI IND STRL 8 (GLOVE) ×1 IMPLANT
GLOVE BIOGEL PI INDICATOR 8 (GLOVE) ×1
GLOVE INDICATOR 8.0 STRL GRN (GLOVE) ×2 IMPLANT
GOWN STRL REUS W/ TWL LRG LVL3 (GOWN DISPOSABLE) ×1 IMPLANT
GOWN STRL REUS W/ TWL XL LVL3 (GOWN DISPOSABLE) ×1 IMPLANT
GOWN STRL REUS W/TWL LRG LVL3 (GOWN DISPOSABLE) ×2
GOWN STRL REUS W/TWL XL LVL3 (GOWN DISPOSABLE) ×2
GRASPER SUT 15 45D LOW PRO (SUTURE) IMPLANT
IV LACTATED RINGER IRRG 3000ML (IV SOLUTION) ×4
IV LR IRRIG 3000ML ARTHROMATIC (IV SOLUTION) ×1 IMPLANT
MANIFOLD NEPTUNE II (INSTRUMENTS) ×2 IMPLANT
MASK FACE SPIDER DISP (MASK) ×2 IMPLANT
MAT ABSORB  FLUID 56X50 GRAY (MISCELLANEOUS) ×1
MAT ABSORB FLUID 56X50 GRAY (MISCELLANEOUS) ×1 IMPLANT
NEEDLE HYPO 22GX1.5 SAFETY (NEEDLE) ×2 IMPLANT
PACK ARTHROSCOPY SHOULDER (MISCELLANEOUS) ×2 IMPLANT
SLING ARM LRG DEEP (SOFTGOODS) ×1 IMPLANT
SLING ULTRA II LG (MISCELLANEOUS) ×1 IMPLANT
STAPLER SKIN PROX 35W (STAPLE) ×2 IMPLANT
STRAP SAFETY 5IN WIDE (MISCELLANEOUS) ×2 IMPLANT
SUT ETHIBOND 0 MO6 C/R (SUTURE) ×2 IMPLANT
SUT VIC AB 2-0 CT1 27 (SUTURE) ×4
SUT VIC AB 2-0 CT1 TAPERPNT 27 (SUTURE) ×2 IMPLANT
TAPE MICROFOAM 4IN (TAPE) ×2 IMPLANT
TUBING ARTHRO INFLOW-ONLY STRL (TUBING) ×2 IMPLANT
WAND WEREWOLF FLOW 90D (MISCELLANEOUS) ×2 IMPLANT

## 2019-06-15 NOTE — Discharge Instructions (Addendum)
Orthopedic discharge instructions: May shower with intact OpSite dressings. Apply ice to affected area frequently. Take ibuprofen 600-800 mg TID with meals for 7-10 days, then as necessary. Take ES Tylenol or pain medication as prescribed when needed.  Return for follow-up in 10-14 days or as scheduled.  AMBULATORY SURGERY  DISCHARGE INSTRUCTIONS   1) The drugs that you were given will stay in your system until tomorrow so for the next 24 hours you should not:  A) Drive an automobile B) Make any legal decisions C) Drink any alcoholic beverage   2) You may resume regular meals tomorrow.  Today it is better to start with liquids and gradually work up to solid foods.  You may eat anything you prefer, but it is better to start with liquids, then soup and crackers, and gradually work up to solid foods.   3) Please notify your doctor immediately if you have any unusual bleeding, trouble breathing, redness and pain at the surgery site, drainage, fever, or pain not relieved by medication.    4) Additional Instructions:        Please contact your physician with any problems or Same Day Surgery at [phone], Monday through Friday 6 am to 4 pm, or [place] at [facility] number at [phone].

## 2019-06-15 NOTE — Anesthesia Post-op Follow-up Note (Signed)
Anesthesia QCDR form completed.        

## 2019-06-15 NOTE — Anesthesia Postprocedure Evaluation (Signed)
Anesthesia Post Note  Patient: [name]  Procedure(s) Performed: ARTHROSCOPY SHOULDER WITH DEBRIDEMENT, DECOMPRESSION,BICEPS TENOLYSIS (Right )  Patient location during evaluation: PACU Anesthesia Type: General Level of consciousness: awake and alert Pain management: pain level controlled Vital Signs Assessment: post-procedure vital signs reviewed and stable Respiratory status: spontaneous breathing, nonlabored ventilation and respiratory function stable Cardiovascular status: blood pressure returned to baseline and stable Postop Assessment: no apparent nausea or vomiting Anesthetic complications: no     Last Vitals:  Vitals:   06/15/19 1244 06/15/19 1253  BP: 117/70 119/71  Pulse: 80 66  Resp: 20 16  Temp: (!) 36.1 C (!) 36.2 C  SpO2: 99% 98%    Last Pain:  Vitals:   06/15/19 1253  TempSrc: Temporal  PainSc: 8                  [name]

## 2019-06-15 NOTE — Op Note (Signed)
06/15/2019  11:38 AM  Patient:   [name]  Pre-Op Diagnosis:   Impingement/tendinopathy with biceps tendinopathy, right shoulder.  Post-Op Diagnosis:   Impingement/tendinopathy with partial-thickness rotator cuff tear, degenerative labral fraying, and biceps tendinopathy, right shoulder.  Procedure:   Extensive arthroscopic debridement and arthroscopic subacromial decompression, right shoulder.  Anesthesia:   GET  Surgeon:   Pascal Lux, MD  Assistant:   None  Findings:   As above. There was moderate labral fraying anteriorly and superiorly without frank detachment of the labrum from the glenoid. There was an articular sided partial-thickness tear involving the posterior portion of the supraspinatus tendon, as well as partial-thickness tearing of the articular side of the superior portion of the subscapularis tendon. The biceps tendon was extensively degenerated with partial-thickness tearing. The articular surfaces of the humerus and glenoid both were in satisfactory condition.  Complications:   None  Fluids:   800 cc  Estimated blood loss:   3 cc  Tourniquet time:   None  Drains:   None  Closure:   Staples      Brief clinical note:   The patient is a 48 year old paraplegic male with a long history of gradually worsening right shoulder pain. The patient's symptoms have progressed despite medications, activity modification, injections, etc. The patient's history and examination are consistent with impingement/tendinopathy with biceps tendinopathy. These findings were confirmed by MRI scan. The patient presents at this time for definitive management of these shoulder symptoms.  Procedure:   The patient was brought into the operating room and lain in the supine position. The patient then underwent general endotracheal intubation and anesthesia before being repositioned in the beach chair position using the beach chair positioner. The right shoulder and upper extremity were  prepped with ChloraPrep solution before being draped sterilely. Preoperative antibiotics were administered. A timeout was performed to confirm the proper surgical site before the expected portal sites and incision site were injected with 0.5% Sensorcaine with epinephrine. A posterior portal was created and the glenohumeral joint thoroughly inspected with the findings as described above. An anterior portal was created using an outside-in technique. The labrum and rotator cuff were further probed, again confirming the above-noted findings. The areas of labral fraying and extensive synovitis were debrided back to stable margins using the full-radius resector. In addition, the areas of partial-thickness rotator cuff tearing also were debrided back to stable margins using the full-radius resector. Although both areas of partial-thickness tearing involves greater than 50% of the tendon thickness and therefore would benefit from repair, the patient was adamant about being able to use his arms after surgery because he is a paraplegic and wheelchair dependent, so no repair was performed. The ArthroCare wand was inserted and used to release the biceps tendon from its labral anchor. Again, because the patient wanted to be able to his use his arms immediately after surgery, no tenodesis was performed. The ArthroCare wand also was used to obtain hemostasis as well as to "anneal" the labrum superiorly and anteriorly. The instruments were removed from the joint after suctioning the excess fluid.  The camera was repositioned through the posterior portal into the subacromial space. A separate lateral portal was created using an outside-in technique. The 3.5 mm full-radius resector was introduced and used to perform a subtotal bursectomy. The ArthroCare wand was then inserted and used to remove the periosteal tissue off the undersurface of the anterior third of the acromion as well as to recess the coracoacromial ligament from its  attachment along the anterior and lateral margins of the acromion. The 4.0 mm acromionizing bur was introduced and used to complete the decompression by removing the undersurface of the anterior third of the acromion. The full radius resector was reintroduced to remove any residual bony debris before the ArthroCare wand was reintroduced to obtain hemostasis. The instruments were then removed from the subacromial space after suctioning the excess fluid.  The portal sites were closed using staples. Sterile occlusive dressings were applied to each portal site. The patient was then awakened, extubated, and returned to the recovery room in satisfactory condition after tolerating the procedure well.
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
## 2019-06-15 NOTE — H&P (Signed)
Paper H&P to be scanned into permanent record. H&P reviewed and patient re-examined. No changes. 

## 2019-06-15 NOTE — Anesthesia Procedure Notes (Signed)
Procedure Name: Intubation Date/Time: 06/15/2019 11:00 AM Performed by: Fredderick Phenix, CRNA Pre-anesthesia Checklist: Patient identified, Emergency Drugs available, Suction available, Patient being monitored and Timeout performed Patient Re-evaluated:Patient Re-evaluated prior to induction Oxygen Delivery Method: Circle system utilized Preoxygenation: Pre-oxygenation with 100% oxygen Induction Type: IV induction Ventilation: Mask ventilation without difficulty Laryngoscope Size: Mac and 4 Grade View: Grade I Tube type: Oral Tube size: 7.5 mm Number of attempts: 1 Airway Equipment and Method: Stylet and Oral airway Placement Confirmation: ETT inserted through vocal cords under direct vision,  positive ETCO2 and breath sounds checked- equal and bilateral Secured at: 21 cm Tube secured with: Tape Dental Injury: Teeth and Oropharynx as per pre-operative assessment

## 2019-06-15 NOTE — Transfer of Care (Signed)
Immediate Anesthesia Transfer of Care Note  Patient: [name]  Procedure(s) Performed: ARTHROSCOPY SHOULDER WITH DEBRIDEMENT, DECOMPRESSION,BICEPS TENOLYSIS (Right )  Patient Location: PACU  Anesthesia Type:General  Level of Consciousness: awake, alert  and oriented  Airway & Oxygen Therapy: Patient Spontanous Breathing and Patient connected to face mask oxygen  Post-op Assessment: Report given to RN and Post -op Vital signs reviewed and stable  Post vital signs: Reviewed and stable  Last Vitals:  Vitals Value Taken Time  BP 112/63 06/15/19 1144  Temp 36.1 C 06/15/19 1144  Pulse 88 06/15/19 1148  Resp 34 06/15/19 1148  SpO2 100 % 06/15/19 1148  Vitals shown include unvalidated device data.  Last Pain:  Vitals:   06/15/19 0958  TempSrc: Temporal         Complications: No apparent anesthesia complications

## 2019-06-16 ENCOUNTER — Encounter: Payer: Self-pay | Admitting: Surgery

## 2019-06-27 DIAGNOSIS — Z9889 Other specified postprocedural states: Secondary | ICD-10-CM | POA: Diagnosis not present

## 2019-06-27 DIAGNOSIS — M25511 Pain in right shoulder: Secondary | ICD-10-CM | POA: Diagnosis not present

## 2019-06-27 DIAGNOSIS — M25611 Stiffness of right shoulder, not elsewhere classified: Secondary | ICD-10-CM | POA: Diagnosis not present

## 2019-06-27 DIAGNOSIS — R29898 Other symptoms and signs involving the musculoskeletal system: Secondary | ICD-10-CM | POA: Diagnosis not present

## 2019-07-24 DIAGNOSIS — S46101D Unspecified injury of muscle, fascia and tendon of long head of biceps, right arm, subsequent encounter: Secondary | ICD-10-CM | POA: Diagnosis not present

## 2019-07-24 DIAGNOSIS — M7581 Other shoulder lesions, right shoulder: Secondary | ICD-10-CM | POA: Diagnosis not present

## 2019-07-24 DIAGNOSIS — M75111 Incomplete rotator cuff tear or rupture of right shoulder, not specified as traumatic: Secondary | ICD-10-CM | POA: Diagnosis not present

## 2019-07-24 DIAGNOSIS — M7582 Other shoulder lesions, left shoulder: Secondary | ICD-10-CM | POA: Diagnosis not present

## 2019-07-24 DIAGNOSIS — M24111 Other articular cartilage disorders, right shoulder: Secondary | ICD-10-CM | POA: Diagnosis not present

## 2019-08-01 ENCOUNTER — Ambulatory Visit
Admission: EM | Admit: 2019-08-01 | Discharge: 2019-08-01 | Disposition: A | Discharge status: Home / Self Care | Payer: Medicare Other | Attending: Family Medicine | Admitting: Family Medicine

## 2019-08-01 ENCOUNTER — Encounter: Payer: Self-pay | Admitting: Emergency Medicine

## 2019-08-01 ENCOUNTER — Other Ambulatory Visit: Payer: Self-pay

## 2019-08-01 DIAGNOSIS — M549 Dorsalgia, unspecified: Secondary | ICD-10-CM

## 2019-08-01 LAB — URINALYSIS, COMPLETE (UACMP) WITH MICROSCOPIC
Bilirubin Urine: NEGATIVE
Glucose, UA: NEGATIVE mg/dL
Hgb urine dipstick: NEGATIVE
Ketones, ur: NEGATIVE mg/dL
Nitrite: NEGATIVE
Protein, ur: NEGATIVE mg/dL
Specific Gravity, Urine: 1.015 (ref 1.005–1.030)
pH: 7 (ref 5.0–8.0)

## 2019-08-01 NOTE — ED Triage Notes (Signed)
Patient c/o low back pain and muscle spasms that started over the weekend. He states he gets UTIs frequently and feels like this is what is going on.
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
## 2019-08-01 NOTE — Discharge Instructions (Signed)
No evidence of UTI.  Stay hydrated.  Follow up with your primary.  Take care  Dr. Lacinda Axon

## 2019-08-02 LAB — URINE CULTURE

## 2019-08-15 ENCOUNTER — Ambulatory Visit: Payer: Medicare Other | Admitting: General Surgery

## 2019-08-17 ENCOUNTER — Ambulatory Visit (INDEPENDENT_AMBULATORY_CARE_PROVIDER_SITE_OTHER): Payer: Medicare Other | Admitting: Internal Medicine

## 2019-08-17 ENCOUNTER — Ambulatory Visit (INDEPENDENT_AMBULATORY_CARE_PROVIDER_SITE_OTHER): Payer: Medicare Other

## 2019-08-17 ENCOUNTER — Other Ambulatory Visit: Payer: Self-pay

## 2019-08-17 ENCOUNTER — Encounter: Payer: Self-pay | Admitting: Internal Medicine

## 2019-08-17 DIAGNOSIS — F339 Major depressive disorder, recurrent, unspecified: Secondary | ICD-10-CM | POA: Diagnosis not present

## 2019-08-17 DIAGNOSIS — F1010 Alcohol abuse, uncomplicated: Secondary | ICD-10-CM | POA: Diagnosis not present

## 2019-08-17 DIAGNOSIS — L98491 Non-pressure chronic ulcer of skin of other sites limited to breakdown of skin: Secondary | ICD-10-CM

## 2019-08-17 DIAGNOSIS — Z87440 Personal history of urinary (tract) infections: Secondary | ICD-10-CM

## 2019-08-17 DIAGNOSIS — R609 Edema, unspecified: Secondary | ICD-10-CM | POA: Diagnosis not present

## 2019-08-17 DIAGNOSIS — E109 Type 1 diabetes mellitus without complications: Secondary | ICD-10-CM

## 2019-08-17 DIAGNOSIS — G8220 Paraplegia, unspecified: Secondary | ICD-10-CM

## 2019-08-17 DIAGNOSIS — D649 Anemia, unspecified: Secondary | ICD-10-CM | POA: Diagnosis not present

## 2019-08-17 DIAGNOSIS — E871 Hypo-osmolality and hyponatremia: Secondary | ICD-10-CM

## 2019-08-17 DIAGNOSIS — Z0000 Encounter for general adult medical examination without abnormal findings: Secondary | ICD-10-CM | POA: Diagnosis not present

## 2019-08-17 NOTE — Patient Instructions (Addendum)
  [name] , Thank you for taking time to come for your Medicare Wellness Visit. I appreciate your ongoing commitment to your health goals. Please review the following plan we discussed and let me know if I can assist you in the future.   These are the goals we discussed: Goals    . Increase physical activity     Arm strengthening exercises       This is a list of the screening recommended for you and due dates:  Health Maintenance  Topic Date Due  . Complete foot exam   03/16/1980  . Eye exam for diabetics  03/16/1980  . HIV Screening  03/16/1985  . Tetanus Vaccine  03/16/1989  . Urine Protein Check  12/09/2017  . Pneumococcal vaccine  12/27/2019*  . Flu Shot  03/06/2020*  . Hemoglobin A1C  09/26/2019  *Topic was postponed. The date shown is not the original due date.
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
## 2019-08-20 NOTE — Assessment & Plan Note (Signed)
Instructed him to decrease his alcohol intake.  Encourage increased po food intake and eat regular meals.  Follow metabolic panel.

## 2019-08-20 NOTE — Assessment & Plan Note (Signed)
Lower extremity swelling has improved per his report.  Follow

## 2019-08-20 NOTE — Assessment & Plan Note (Signed)
Discussed the need to quit drinking.  Discussed getting him help, counseling, etc.  He declines.  Does plan to decrease his alcohol intake.

## 2019-08-20 NOTE — Assessment & Plan Note (Signed)
Stable

## 2019-08-20 NOTE — Assessment & Plan Note (Signed)
Discussed with him today.  Denies any suicidal ideations.  Declines further intervention at this time.  Declines counseling, psych referral, etc.  Follow.

## 2019-08-20 NOTE — Assessment & Plan Note (Signed)
Has been evaluated at wound center.  Declines any further evaluation at this time.  States he is taking care of it and does not need any further intervention.  Follow.

## 2019-08-20 NOTE — Assessment & Plan Note (Signed)
Follow cbc.  Has seen GI.

## 2019-08-20 NOTE — Assessment & Plan Note (Signed)
Recent evaluation revealed no evidence of uti.  Currently doing well.  Follow.

## 2019-08-20 NOTE — Assessment & Plan Note (Signed)
Low carb diet and exercise.  Eat regular meals. Discussed not going long periods without eating.  Follow met b and a1c.

## 2019-08-23 ENCOUNTER — Telehealth: Payer: Self-pay

## 2019-08-23 NOTE — Telephone Encounter (Signed)
Barnetta Chapel from [facility] called requesting that Dr. Nicki Reaper complete a quality improvement measurement for patient.    Fax # given.  Please have Dr. Nicki Reaper review paperwork.

## 2019-08-24 DIAGNOSIS — Z794 Long term (current) use of insulin: Secondary | ICD-10-CM | POA: Diagnosis not present

## 2019-08-24 DIAGNOSIS — E109 Type 1 diabetes mellitus without complications: Secondary | ICD-10-CM | POA: Diagnosis not present

## 2019-08-24 NOTE — Telephone Encounter (Signed)
Do we have paperwork or do they need to fax.  If do not have, have them fax [name] paperwork and hold message in Trisha's box until we receive.

## 2019-08-24 NOTE — Telephone Encounter (Signed)
Barnetta Chapel from [facility] called requesting that Dr. Nicki Reaper complete a quality improvement measurement for patient.    Fax # given.  Please have Dr. Nicki Reaper review paperwork.  Nina,cma

## 2019-08-25 NOTE — Telephone Encounter (Signed)
I have not received any paperwork for him.

## 2019-08-28 ENCOUNTER — Other Ambulatory Visit: Payer: Self-pay | Admitting: Surgery

## 2019-08-28 DIAGNOSIS — M7582 Other shoulder lesions, left shoulder: Secondary | ICD-10-CM

## 2019-09-07 ENCOUNTER — Other Ambulatory Visit: Payer: Self-pay

## 2019-09-07 ENCOUNTER — Ambulatory Visit
Admission: RE | Admit: 2019-09-07 | Discharge: 2019-09-07 | Disposition: A | Discharge status: Home / Self Care | Payer: Medicare Other | Source: Ambulatory Visit | Attending: Surgery | Admitting: Surgery

## 2019-09-07 DIAGNOSIS — M75122 Complete rotator cuff tear or rupture of left shoulder, not specified as traumatic: Secondary | ICD-10-CM | POA: Diagnosis not present

## 2019-09-07 DIAGNOSIS — S46012A Strain of muscle(s) and tendon(s) of the rotator cuff of left shoulder, initial encounter: Secondary | ICD-10-CM | POA: Diagnosis not present

## 2019-09-07 DIAGNOSIS — M7582 Other shoulder lesions, left shoulder: Secondary | ICD-10-CM | POA: Insufficient documentation

## 2019-09-08 DIAGNOSIS — S46012A Strain of muscle(s) and tendon(s) of the rotator cuff of left shoulder, initial encounter: Secondary | ICD-10-CM | POA: Insufficient documentation

## 2019-09-14 NOTE — Telephone Encounter (Signed)
Have not seen any paperwork, do not have contact information to reach out to [place].

## 2019-09-27 ENCOUNTER — Encounter: Payer: Self-pay | Admitting: *Deleted

## 2019-09-28 ENCOUNTER — Other Ambulatory Visit: Payer: Self-pay | Admitting: Surgery

## 2019-10-02 ENCOUNTER — Other Ambulatory Visit: Payer: Self-pay | Admitting: Internal Medicine

## 2019-10-12 ENCOUNTER — Other Ambulatory Visit: Payer: Self-pay

## 2019-10-12 ENCOUNTER — Encounter
Admission: RE | Admit: 2019-10-12 | Discharge: 2019-10-12 | Disposition: A | Discharge status: Home / Self Care | Payer: Medicare Other | Source: Ambulatory Visit | Attending: Surgery | Admitting: Surgery

## 2019-10-12 DIAGNOSIS — Z01812 Encounter for preprocedural laboratory examination: Secondary | ICD-10-CM | POA: Insufficient documentation

## 2019-10-12 LAB — BASIC METABOLIC PANEL
Anion gap: 11 (ref 5–15)
BUN: 7 mg/dL (ref 6–20)
CO2: 28 mmol/L (ref 22–32)
Calcium: 9.2 mg/dL (ref 8.9–10.3)
Chloride: 92 mmol/L — ABNORMAL LOW (ref 98–111)
Creatinine, Ser: 0.52 mg/dL — ABNORMAL LOW (ref 0.61–1.24)
GFR calc Af Amer: >60 mL/min (ref >60)
GFR calc non Af Amer: >60 mL/min (ref >60)
Glucose, Bld: 193 mg/dL — ABNORMAL HIGH (ref 70–99)
Potassium: 5.2 mmol/L — ABNORMAL HIGH (ref 3.5–5.1)
Sodium: 131 mmol/L — ABNORMAL LOW (ref 135–145)

## 2019-10-12 LAB — CBC
HCT: 38.1 % — ABNORMAL LOW (ref 39.0–52.0)
Hemoglobin: 13.3 g/dL (ref 13.0–17.0)
MCH: 32.7 pg (ref 26.0–34.0)
MCHC: 34.9 g/dL (ref 30.0–36.0)
MCV: 93.6 fL (ref 80.0–100.0)
Platelets: 253 10*3/uL (ref 150–400)
RBC: 4.07 MIL/uL — ABNORMAL LOW (ref 4.22–5.81)
RDW: 11.6 % (ref 11.5–15.5)
WBC: 9.6 10*3/uL (ref 4.0–10.5)
nRBC: 0 % (ref 0.0–0.2)

## 2019-10-12 NOTE — Patient Instructions (Addendum)
Your procedure is scheduled on: Thursday 10/19/19 Report to [facility]. To find out your arrival time please call [phone] between 1PM - 3PM on Wednesday 10/18/19.  Remember: Instructions that are not followed completely may result in serious medical risk, up to and including death, or upon the discretion of your surgeon and anesthesiologist your surgery may need to be rescheduled.     _X__ 1. Do not eat food after midnight the night before your procedure.                 No gum chewing or hard candies. You may drink clear liquids up to 2 hours                 before you are scheduled to arrive for your surgery- DO not drink clear                 liquids within 2 hours of the start of your surgery.                 Clear Liquids include:  water, apple juice without pulp, clear carbohydrate                 drink such as Clearfast or Gatorade, Black Coffee or Tea (Do not add                 anything to coffee or tea). Diabetics water only  __X__2.  On the morning of surgery brush your teeth with toothpaste and water, you                 may rinse your mouth with mouthwash if you wish.  Do not swallow any              toothpaste of mouthwash.     _X__ 3.  No Alcohol for 24 hours before or after surgery.   _X__ 4.  Do Not Smoke or use e-cigarettes For 24 Hours Prior to Your Surgery.                 Do not use any chewable tobacco products for at least 6 hours prior to                 surgery.  ____  5.  Bring all medications with you on the day of surgery if instructed.   __X__  6.  Notify your doctor if there is any change in your medical condition      (cold, fever, infections).     Do not wear jewelry, make-up, hairpins, clips or nail polish. Do not wear lotions, powders, or perfumes.  Do not shave 48 hours prior to surgery. Men may shave face and neck. Do not bring valuables to the hospital.    [facility] is not responsible  for any belongings or valuables.  Contacts, dentures/partials or body piercings may not be worn into surgery. Bring a case for your contacts, glasses or hearing aids, a denture cup will be supplied. Leave your suitcase in the car. After surgery it may be brought to your room. For patients admitted to the hospital, discharge time is determined by your treatment team.   Patients discharged the day of surgery will not be allowed to drive home.   Please read over the following fact sheets that you were given:   MRSA Information  __X__ Take these medicines the morning of surgery with A SIP OF WATER:  1. baclofen (LIORESAL)  2. methenamine (MANDELAMINE  3. omeprazole (PRILOSEC)  4.  5.  6.  ____ Fleet Enema (as directed)   __X__ Use CHG Soap/SAGE wipes as directed  ____ Use inhalers on the day of surgery  ____ Stop metformin/Janumet/Farxiga 2 days prior to surgery    ____ Take 1/2 of usual insulin dose the night before surgery. No insulin the morning          of surgery.   ____ Stop Blood Thinners Coumadin/Plavix/Xarelto/Pleta/Pradaxa/Eliquis/Effient/Aspirin  on   Or contact your Surgeon, Cardiologist or Medical Doctor regarding  ability to stop your blood thinners  __X__ Stop Anti-inflammatories 7 days before surgery such as Advil, Ibuprofen, Motrin,  BC or Goodies Powder, Naprosyn, Naproxen, Aleve, Aspirin    __X__ Stop all herbal supplements, fish oil or vitamin E until after surgery.    ____ Bring C-Pap to the hospital.

## 2019-10-16 ENCOUNTER — Other Ambulatory Visit
Admission: RE | Admit: 2019-10-16 | Discharge: 2019-10-16 | Disposition: A | Discharge status: Home / Self Care | Payer: Medicare Other | Source: Ambulatory Visit | Attending: Surgery | Admitting: Surgery

## 2019-10-16 ENCOUNTER — Other Ambulatory Visit: Payer: Self-pay

## 2019-10-16 DIAGNOSIS — Z20828 Contact with and (suspected) exposure to other viral communicable diseases: Secondary | ICD-10-CM | POA: Diagnosis not present

## 2019-10-16 DIAGNOSIS — Z01812 Encounter for preprocedural laboratory examination: Secondary | ICD-10-CM | POA: Insufficient documentation

## 2019-10-17 LAB — SARS CORONAVIRUS 2 (TAT 6-24 HRS): SARS Coronavirus 2: NEGATIVE

## 2019-10-18 NOTE — Progress Notes (Signed)
Order for cefazolin 2 g IV x 1 to be given within 1 hour of surgery entered. Order has been signed and held. Nurse to release when appropriate.  Dorena Bodo, PharmD

## 2019-10-19 ENCOUNTER — Ambulatory Visit: Payer: Medicare Other

## 2019-10-19 ENCOUNTER — Ambulatory Visit: Payer: Medicare Other | Admitting: Certified Registered Nurse Anesthetist

## 2019-10-19 ENCOUNTER — Encounter
Admission: AD | Disposition: A | Discharge status: Skilled Nursing Facility | Payer: Self-pay | Source: Home / Self Care | Attending: Surgery

## 2019-10-19 ENCOUNTER — Encounter: Payer: Self-pay | Admitting: Certified Registered Nurse Anesthetist

## 2019-10-19 ENCOUNTER — Other Ambulatory Visit: Payer: Self-pay

## 2019-10-19 ENCOUNTER — Inpatient Hospital Stay
Admission: AD | Admit: 2019-10-19 | Discharge: 2019-10-24 | DRG: 500 | Disposition: A | Discharge status: Skilled Nursing Facility | Payer: Medicare Other | Attending: Surgery | Admitting: Surgery

## 2019-10-19 DIAGNOSIS — Z794 Long term (current) use of insulin: Secondary | ICD-10-CM

## 2019-10-19 DIAGNOSIS — E1069 Type 1 diabetes mellitus with other specified complication: Secondary | ICD-10-CM | POA: Diagnosis not present

## 2019-10-19 DIAGNOSIS — M2550 Pain in unspecified joint: Secondary | ICD-10-CM | POA: Diagnosis not present

## 2019-10-19 DIAGNOSIS — R279 Unspecified lack of coordination: Secondary | ICD-10-CM | POA: Diagnosis not present

## 2019-10-19 DIAGNOSIS — E1065 Type 1 diabetes mellitus with hyperglycemia: Secondary | ICD-10-CM | POA: Diagnosis not present

## 2019-10-19 DIAGNOSIS — K219 Gastro-esophageal reflux disease without esophagitis: Secondary | ICD-10-CM | POA: Diagnosis not present

## 2019-10-19 DIAGNOSIS — M75100 Unspecified rotator cuff tear or rupture of unspecified shoulder, not specified as traumatic: Secondary | ICD-10-CM | POA: Diagnosis present

## 2019-10-19 DIAGNOSIS — Z7401 Bed confinement status: Secondary | ICD-10-CM | POA: Diagnosis not present

## 2019-10-19 DIAGNOSIS — L89150 Pressure ulcer of sacral region, unstageable: Secondary | ICD-10-CM | POA: Diagnosis not present

## 2019-10-19 DIAGNOSIS — M6281 Muscle weakness (generalized): Secondary | ICD-10-CM | POA: Diagnosis not present

## 2019-10-19 DIAGNOSIS — Z4789 Encounter for other orthopedic aftercare: Secondary | ICD-10-CM | POA: Diagnosis not present

## 2019-10-19 DIAGNOSIS — K5900 Constipation, unspecified: Secondary | ICD-10-CM | POA: Diagnosis not present

## 2019-10-19 DIAGNOSIS — E569 Vitamin deficiency, unspecified: Secondary | ICD-10-CM | POA: Diagnosis not present

## 2019-10-19 DIAGNOSIS — E871 Hypo-osmolality and hyponatremia: Secondary | ICD-10-CM | POA: Diagnosis present

## 2019-10-19 DIAGNOSIS — G8220 Paraplegia, unspecified: Secondary | ICD-10-CM | POA: Diagnosis not present

## 2019-10-19 DIAGNOSIS — G4700 Insomnia, unspecified: Secondary | ICD-10-CM | POA: Diagnosis not present

## 2019-10-19 DIAGNOSIS — M75111 Incomplete rotator cuff tear or rupture of right shoulder, not specified as traumatic: Secondary | ICD-10-CM | POA: Diagnosis not present

## 2019-10-19 DIAGNOSIS — D649 Anemia, unspecified: Secondary | ICD-10-CM | POA: Diagnosis not present

## 2019-10-19 DIAGNOSIS — M75102 Unspecified rotator cuff tear or rupture of left shoulder, not specified as traumatic: Secondary | ICD-10-CM | POA: Diagnosis not present

## 2019-10-19 DIAGNOSIS — M62838 Other muscle spasm: Secondary | ICD-10-CM | POA: Diagnosis not present

## 2019-10-19 DIAGNOSIS — G8254 Quadriplegia, C5-C7 incomplete: Secondary | ICD-10-CM | POA: Diagnosis not present

## 2019-10-19 DIAGNOSIS — Z79899 Other long term (current) drug therapy: Secondary | ICD-10-CM

## 2019-10-19 DIAGNOSIS — G8918 Other acute postprocedural pain: Secondary | ICD-10-CM | POA: Diagnosis not present

## 2019-10-19 DIAGNOSIS — E109 Type 1 diabetes mellitus without complications: Secondary | ICD-10-CM | POA: Diagnosis not present

## 2019-10-19 DIAGNOSIS — M24111 Other articular cartilage disorders, right shoulder: Secondary | ICD-10-CM | POA: Diagnosis not present

## 2019-10-19 DIAGNOSIS — N390 Urinary tract infection, site not specified: Secondary | ICD-10-CM | POA: Diagnosis not present

## 2019-10-19 DIAGNOSIS — R5381 Other malaise: Secondary | ICD-10-CM | POA: Diagnosis not present

## 2019-10-19 DIAGNOSIS — M7581 Other shoulder lesions, right shoulder: Secondary | ICD-10-CM | POA: Diagnosis not present

## 2019-10-19 DIAGNOSIS — Z981 Arthrodesis status: Secondary | ICD-10-CM | POA: Diagnosis not present

## 2019-10-19 DIAGNOSIS — R030 Elevated blood-pressure reading, without diagnosis of hypertension: Secondary | ICD-10-CM | POA: Diagnosis not present

## 2019-10-19 DIAGNOSIS — Z20828 Contact with and (suspected) exposure to other viral communicable diseases: Secondary | ICD-10-CM | POA: Diagnosis present

## 2019-10-19 DIAGNOSIS — I959 Hypotension, unspecified: Secondary | ICD-10-CM | POA: Diagnosis not present

## 2019-10-19 DIAGNOSIS — I10 Essential (primary) hypertension: Secondary | ICD-10-CM | POA: Diagnosis present

## 2019-10-19 DIAGNOSIS — Z743 Need for continuous supervision: Secondary | ICD-10-CM | POA: Diagnosis not present

## 2019-10-19 DIAGNOSIS — E119 Type 2 diabetes mellitus without complications: Secondary | ICD-10-CM | POA: Diagnosis not present

## 2019-10-19 DIAGNOSIS — E876 Hypokalemia: Secondary | ICD-10-CM | POA: Diagnosis not present

## 2019-10-19 DIAGNOSIS — L89109 Pressure ulcer of unspecified part of back, unspecified stage: Secondary | ICD-10-CM | POA: Insufficient documentation

## 2019-10-19 DIAGNOSIS — Z23 Encounter for immunization: Secondary | ICD-10-CM | POA: Diagnosis not present

## 2019-10-19 DIAGNOSIS — F17220 Nicotine dependence, chewing tobacco, uncomplicated: Secondary | ICD-10-CM | POA: Diagnosis present

## 2019-10-19 DIAGNOSIS — D62 Acute posthemorrhagic anemia: Secondary | ICD-10-CM | POA: Diagnosis not present

## 2019-10-19 DIAGNOSIS — Z87440 Personal history of urinary (tract) infections: Secondary | ICD-10-CM

## 2019-10-19 HISTORY — PX: SHOULDER ARTHROSCOPY WITH ROTATOR CUFF REPAIR AND SUBACROMIAL DECOMPRESSION: SHX5686

## 2019-10-19 LAB — GLUCOSE, CAPILLARY
Glucose-Capillary: 256 mg/dL — ABNORMAL HIGH (ref 70–99)
Glucose-Capillary: 316 mg/dL — ABNORMAL HIGH (ref 70–99)
Glucose-Capillary: 293 mg/dL — ABNORMAL HIGH (ref 70–99)
Glucose-Capillary: 329 mg/dL — ABNORMAL HIGH (ref 70–99)
Glucose-Capillary: 264 mg/dL — ABNORMAL HIGH (ref 70–99)
Glucose-Capillary: 254 mg/dL — ABNORMAL HIGH (ref 70–99)
Glucose-Capillary: 243 mg/dL — ABNORMAL HIGH (ref 70–99)

## 2019-10-19 LAB — POCT I-STAT, CHEM 8
BUN: 4 mg/dL — ABNORMAL LOW (ref 6–20)
Calcium, Ion: 1.17 mmol/L (ref 1.15–1.40)
Chloride: 94 mmol/L — ABNORMAL LOW (ref 98–111)
Creatinine, Ser: 0.5 mg/dL — ABNORMAL LOW (ref 0.61–1.24)
Glucose, Bld: 247 mg/dL — ABNORMAL HIGH (ref 70–99)
HCT: 38 % — ABNORMAL LOW (ref 39.0–52.0)
Hemoglobin: 12.9 g/dL — ABNORMAL LOW (ref 13.0–17.0)
Potassium: 3.7 mmol/L (ref 3.5–5.1)
Sodium: 130 mmol/L — ABNORMAL LOW (ref 135–145)
TCO2: 23 mmol/L (ref 22–32)

## 2019-10-19 LAB — URINE DRUG SCREEN, QUALITATIVE (ARMC ONLY)
Amphetamines, Ur Screen: NOT DETECTED
Barbiturates, Ur Screen: NOT DETECTED
Benzodiazepine, Ur Scrn: NOT DETECTED
Cannabinoid 50 Ng, Ur ~~LOC~~: POSITIVE — AB
Cocaine Metabolite,Ur ~~LOC~~: NOT DETECTED
MDMA (Ecstasy)Ur Screen: NOT DETECTED
Methadone Scn, Ur: NOT DETECTED
Opiate, Ur Screen: NOT DETECTED
Phencyclidine (PCP) Ur S: NOT DETECTED
Tricyclic, Ur Screen: NOT DETECTED

## 2019-10-19 LAB — HEMOGLOBIN A1C
Hgb A1c MFr Bld: 6.2 % — ABNORMAL HIGH (ref 4.8–5.6)
Mean Plasma Glucose: 131.24 mg/dL

## 2019-10-19 SURGERY — SHOULDER ARTHROSCOPY WITH ROTATOR CUFF REPAIR AND SUBACROMIAL DECOMPRESSION
Anesthesia: General | Laterality: Left

## 2019-10-19 MED ORDER — INSULIN ASPART 100 UNIT/ML ~~LOC~~ SOLN
7.0000 [IU] | Freq: Once | SUBCUTANEOUS | Status: AC
  Administered 2019-10-19: 14:00:00 7 [IU] via SUBCUTANEOUS

## 2019-10-19 MED ORDER — ACETAMINOPHEN 500 MG PO TABS
1000.0000 mg | ORAL_TABLET | Freq: Four times a day (QID) | ORAL | Status: AC
  Administered 2019-10-20 (×3): 1000 mg via ORAL
  Filled 2019-10-19 (×4): qty 2

## 2019-10-19 MED ORDER — MIDAZOLAM HCL 2 MG/2ML IJ SOLN
1.0000 mg | INTRAMUSCULAR | Status: DC | PRN
  Administered 2019-10-19: 11:00:00 1 mg via INTRAVENOUS

## 2019-10-19 MED ORDER — DOCUSATE SODIUM 100 MG PO CAPS
100.0000 mg | ORAL_CAPSULE | Freq: Two times a day (BID) | ORAL | Status: DC
  Administered 2019-10-19 – 2019-10-23 (×7): 100 mg via ORAL
  Filled 2019-10-19 (×11): qty 1

## 2019-10-19 MED ORDER — CEFAZOLIN SODIUM-DEXTROSE 2-4 GM/100ML-% IV SOLN
2.0000 g | Freq: Four times a day (QID) | INTRAVENOUS | Status: AC
  Administered 2019-10-19 – 2019-10-20 (×3): 2 g via INTRAVENOUS
  Filled 2019-10-19 (×3): qty 100

## 2019-10-19 MED ORDER — TRAMADOL HCL 50 MG PO TABS
50.0000 mg | ORAL_TABLET | Freq: Four times a day (QID) | ORAL | Status: DC | PRN
  Filled 2019-10-19: qty 1

## 2019-10-19 MED ORDER — IPRATROPIUM-ALBUTEROL 0.5-2.5 (3) MG/3ML IN SOLN
RESPIRATORY_TRACT | Status: AC
  Administered 2019-10-19: 3 mL via RESPIRATORY_TRACT
  Filled 2019-10-19: qty 3

## 2019-10-19 MED ORDER — BUPIVACAINE HCL (PF) 0.5 % IJ SOLN
INTRAMUSCULAR | Status: AC
  Filled 2019-10-19: qty 10

## 2019-10-19 MED ORDER — LIDOCAINE HCL (PF) 1 % IJ SOLN
INTRAMUSCULAR | Status: AC
  Filled 2019-10-19: qty 5

## 2019-10-19 MED ORDER — INSULIN ASPART 100 UNIT/ML ~~LOC~~ SOLN
6.0000 [IU] | Freq: Once | SUBCUTANEOUS | Status: AC
  Administered 2019-10-19: 6 [IU] via SUBCUTANEOUS

## 2019-10-19 MED ORDER — CEFAZOLIN SODIUM-DEXTROSE 2-4 GM/100ML-% IV SOLN
INTRAVENOUS | Status: AC
  Filled 2019-10-19: qty 100

## 2019-10-19 MED ORDER — INSULIN ASPART 100 UNIT/ML ~~LOC~~ SOLN
6.0000 [IU] | Freq: Once | SUBCUTANEOUS | Status: AC
  Administered 2019-10-19: 16:00:00 6 [IU] via SUBCUTANEOUS

## 2019-10-19 MED ORDER — SUCRALFATE 1 G PO TABS
1.0000 g | ORAL_TABLET | Freq: Three times a day (TID) | ORAL | Status: DC
  Filled 2019-10-19 (×7): qty 1

## 2019-10-19 MED ORDER — FLEET ENEMA 7-19 GM/118ML RE ENEM: 1.0000 | ENEMA | Freq: Once | RECTAL | Status: DC | PRN

## 2019-10-19 MED ORDER — BUPIVACAINE LIPOSOME 1.3 % IJ SUSP
INTRAMUSCULAR | Status: DC | PRN
  Administered 2019-10-19: 7 mL via PERINEURAL
  Administered 2019-10-19: 13 mL via PERINEURAL

## 2019-10-19 MED ORDER — INSULIN DETEMIR 100 UNIT/ML ~~LOC~~ SOLN
11.0000 [IU] | Freq: Every day | SUBCUTANEOUS | Status: DC
  Administered 2019-10-19 – 2019-10-23 (×5): 11 [IU] via SUBCUTANEOUS
  Filled 2019-10-19 (×7): qty 0.11

## 2019-10-19 MED ORDER — MAGNESIUM OXIDE 400 (241.3 MG) MG PO TABS
400.0000 mg | ORAL_TABLET | Freq: Every day | ORAL | Status: DC
  Administered 2019-10-19: 400 mg via ORAL
  Filled 2019-10-19: qty 1

## 2019-10-19 MED ORDER — OXYCODONE HCL 5 MG PO TABS: 5.0000 mg | ORAL_TABLET | Freq: Once | ORAL | Status: DC | PRN

## 2019-10-19 MED ORDER — INSULIN ASPART 100 UNIT/ML ~~LOC~~ SOLN: 5.0000 [IU] | Freq: Once | SUBCUTANEOUS | Status: DC

## 2019-10-19 MED ORDER — DIPHENHYDRAMINE HCL 12.5 MG/5ML PO ELIX
12.5000 mg | ORAL_SOLUTION | ORAL | Status: DC | PRN
  Administered 2019-10-24: 12.5 mg via ORAL
  Filled 2019-10-19: qty 10
  Filled 2019-10-19: qty 5

## 2019-10-19 MED ORDER — MAGNESIUM OXIDE -MG SUPPLEMENT 400 (240 MG) MG PO TABS
400.0000 mg | ORAL_TABLET | Freq: Every day | ORAL | Status: DC
  Filled 2019-10-19: qty 1

## 2019-10-19 MED ORDER — LIDOCAINE HCL (CARDIAC) PF 100 MG/5ML IV SOSY
PREFILLED_SYRINGE | INTRAVENOUS | Status: DC | PRN
  Administered 2019-10-19: 80 mg via INTRAVENOUS

## 2019-10-19 MED ORDER — NAPHAZOLINE-GLYCERIN 0.012-0.2 % OP SOLN
1.0000 [drp] | Freq: Four times a day (QID) | OPHTHALMIC | Status: DC | PRN
  Filled 2019-10-19: qty 15

## 2019-10-19 MED ORDER — IPRATROPIUM-ALBUTEROL 0.5-2.5 (3) MG/3ML IN SOLN
3.0000 mL | RESPIRATORY_TRACT | Status: DC
  Administered 2019-10-19: 14:00:00 3 mL via RESPIRATORY_TRACT

## 2019-10-19 MED ORDER — VITAMIN C 500 MG PO TABS
1000.0000 mg | ORAL_TABLET | Freq: Two times a day (BID) | ORAL | Status: DC
  Administered 2019-10-19 – 2019-10-24 (×10): 1000 mg via ORAL
  Filled 2019-10-19 (×10): qty 2

## 2019-10-19 MED ORDER — OXYCODONE HCL 5 MG/5ML PO SOLN: 5.0000 mg | Freq: Once | ORAL | Status: DC | PRN

## 2019-10-19 MED ORDER — ONDANSETRON HCL 4 MG/2ML IJ SOLN
INTRAMUSCULAR | Status: AC
  Filled 2019-10-19: qty 2

## 2019-10-19 MED ORDER — KETOROLAC TROMETHAMINE 30 MG/ML IJ SOLN
INTRAMUSCULAR | Status: AC
  Administered 2019-10-19: 30 mg via INTRAVENOUS
  Filled 2019-10-19: qty 1

## 2019-10-19 MED ORDER — SUGAMMADEX SODIUM 200 MG/2ML IV SOLN
INTRAVENOUS | Status: DC | PRN
  Administered 2019-10-19: 150 mg via INTRAVENOUS

## 2019-10-19 MED ORDER — OXYCODONE HCL 5 MG PO TABS
5.0000 mg | ORAL_TABLET | ORAL | Status: DC | PRN
  Administered 2019-10-21 – 2019-10-24 (×11): 5 mg via ORAL
  Filled 2019-10-19 (×2): qty 1
  Filled 2019-10-19: qty 2
  Filled 2019-10-19 (×9): qty 1

## 2019-10-19 MED ORDER — ROCURONIUM BROMIDE 50 MG/5ML IV SOLN
INTRAVENOUS | Status: AC
  Filled 2019-10-19: qty 1

## 2019-10-19 MED ORDER — VITAMIN B-12 1000 MCG PO TABS: 2500.0000 ug | ORAL_TABLET | Freq: Every day | ORAL | Status: DC | PRN

## 2019-10-19 MED ORDER — PHENYLEPHRINE IN HARD FAT 0.25 % RE SUPP
1.0000 | Freq: Two times a day (BID) | RECTAL | Status: DC
  Filled 2019-10-19 (×12): qty 1

## 2019-10-19 MED ORDER — INSULIN ASPART 100 UNIT/ML ~~LOC~~ SOLN
SUBCUTANEOUS | Status: AC
  Administered 2019-10-19: 6 [IU] via SUBCUTANEOUS
  Filled 2019-10-19: qty 1

## 2019-10-19 MED ORDER — ONDANSETRON HCL 4 MG/2ML IJ SOLN: 4.0000 mg | Freq: Four times a day (QID) | INTRAMUSCULAR | Status: DC | PRN

## 2019-10-19 MED ORDER — LIDOCAINE HCL 4 % MT SOLN
OROMUCOSAL | Status: DC | PRN
  Administered 2019-10-19: 4 mL via TOPICAL

## 2019-10-19 MED ORDER — EPHEDRINE SULFATE 50 MG/ML IJ SOLN
INTRAMUSCULAR | Status: AC
  Filled 2019-10-19: qty 1

## 2019-10-19 MED ORDER — BACLOFEN 10 MG PO TABS
40.0000 mg | ORAL_TABLET | Freq: Three times a day (TID) | ORAL | Status: DC
  Administered 2019-10-19 – 2019-10-24 (×15): 40 mg via ORAL
  Filled 2019-10-19 (×17): qty 4

## 2019-10-19 MED ORDER — FENTANYL CITRATE (PF) 100 MCG/2ML IJ SOLN
50.0000 ug | INTRAMUSCULAR | Status: DC | PRN
  Administered 2019-10-19: 11:00:00 50 ug via INTRAVENOUS

## 2019-10-19 MED ORDER — SODIUM CHLORIDE 0.9 % IV SOLN
INTRAVENOUS | Status: DC
  Administered 2019-10-19: 18:00:00 via INTRAVENOUS

## 2019-10-19 MED ORDER — INSULIN ASPART 100 UNIT/ML ~~LOC~~ SOLN
SUBCUTANEOUS | Status: AC
  Filled 2019-10-19: qty 1

## 2019-10-19 MED ORDER — SUGAMMADEX SODIUM 200 MG/2ML IV SOLN
INTRAVENOUS | Status: AC
  Filled 2019-10-19: qty 2

## 2019-10-19 MED ORDER — DEXAMETHASONE SODIUM PHOSPHATE 10 MG/ML IJ SOLN
INTRAMUSCULAR | Status: DC | PRN
  Administered 2019-10-19: 10 mg via INTRAVENOUS

## 2019-10-19 MED ORDER — LIDOCAINE HCL (PF) 2 % IJ SOLN
INTRAMUSCULAR | Status: AC
  Filled 2019-10-19: qty 10

## 2019-10-19 MED ORDER — METOCLOPRAMIDE HCL 10 MG PO TABS: 5.0000 mg | ORAL_TABLET | Freq: Three times a day (TID) | ORAL | Status: DC | PRN

## 2019-10-19 MED ORDER — SODIUM CHLORIDE 0.9 % IV SOLN
INTRAVENOUS | Status: DC
  Administered 2019-10-19: 11:00:00 via INTRAVENOUS

## 2019-10-19 MED ORDER — METHENAMINE MANDELATE 1 G PO TABS
1000.0000 mg | ORAL_TABLET | Freq: Two times a day (BID) | ORAL | Status: DC
  Administered 2019-10-19 – 2019-10-24 (×10): 1000 mg via ORAL
  Filled 2019-10-19 (×13): qty 1

## 2019-10-19 MED ORDER — ROCURONIUM BROMIDE 100 MG/10ML IV SOLN
INTRAVENOUS | Status: DC | PRN
  Administered 2019-10-19: 50 mg via INTRAVENOUS

## 2019-10-19 MED ORDER — METOCLOPRAMIDE HCL 5 MG/ML IJ SOLN: 5.0000 mg | Freq: Three times a day (TID) | INTRAMUSCULAR | Status: DC | PRN

## 2019-10-19 MED ORDER — BUPIVACAINE LIPOSOME 1.3 % IJ SUSP
INTRAMUSCULAR | Status: AC
  Filled 2019-10-19: qty 20

## 2019-10-19 MED ORDER — DEXAMETHASONE SODIUM PHOSPHATE 10 MG/ML IJ SOLN
INTRAMUSCULAR | Status: AC
  Filled 2019-10-19: qty 1

## 2019-10-19 MED ORDER — ACETAMINOPHEN 10 MG/ML IV SOLN
INTRAVENOUS | Status: DC | PRN
  Administered 2019-10-19: 1000 mg via INTRAVENOUS

## 2019-10-19 MED ORDER — FENTANYL CITRATE (PF) 100 MCG/2ML IJ SOLN
INTRAMUSCULAR | Status: AC
  Filled 2019-10-19: qty 2

## 2019-10-19 MED ORDER — PROPOFOL 10 MG/ML IV BOLUS
INTRAVENOUS | Status: DC | PRN
  Administered 2019-10-19: 160 mg via INTRAVENOUS

## 2019-10-19 MED ORDER — MIDAZOLAM HCL 2 MG/2ML IJ SOLN
INTRAMUSCULAR | Status: AC
  Administered 2019-10-19: 1 mg via INTRAVENOUS
  Filled 2019-10-19: qty 2

## 2019-10-19 MED ORDER — KETOROLAC TROMETHAMINE 15 MG/ML IJ SOLN
15.0000 mg | Freq: Four times a day (QID) | INTRAMUSCULAR | Status: DC
  Administered 2019-10-20 (×2): 15 mg via INTRAVENOUS
  Filled 2019-10-19 (×2): qty 1

## 2019-10-19 MED ORDER — ENOXAPARIN SODIUM 40 MG/0.4ML ~~LOC~~ SOLN
40.0000 mg | SUBCUTANEOUS | Status: DC
  Administered 2019-10-20 – 2019-10-24 (×5): 40 mg via SUBCUTANEOUS
  Filled 2019-10-19 (×5): qty 0.4

## 2019-10-19 MED ORDER — ONDANSETRON HCL 4 MG PO TABS: 4.0000 mg | ORAL_TABLET | Freq: Four times a day (QID) | ORAL | Status: DC | PRN

## 2019-10-19 MED ORDER — MIDAZOLAM HCL 2 MG/2ML IJ SOLN
INTRAMUSCULAR | Status: DC | PRN
  Administered 2019-10-19: 2 mg via INTRAVENOUS

## 2019-10-19 MED ORDER — HYDROMORPHONE HCL 1 MG/ML IJ SOLN: 0.2500 mg | INTRAMUSCULAR | Status: DC | PRN

## 2019-10-19 MED ORDER — BUPIVACAINE HCL (PF) 0.5 % IJ SOLN
INTRAMUSCULAR | Status: DC | PRN
  Administered 2019-10-19: 7 mL via PERINEURAL
  Administered 2019-10-19: 3 mL via PERINEURAL

## 2019-10-19 MED ORDER — FENTANYL CITRATE (PF) 100 MCG/2ML IJ SOLN
INTRAMUSCULAR | Status: AC
  Administered 2019-10-19: 50 ug via INTRAVENOUS
  Filled 2019-10-19: qty 2

## 2019-10-19 MED ORDER — EPHEDRINE SULFATE 50 MG/ML IJ SOLN
INTRAMUSCULAR | Status: DC | PRN
  Administered 2019-10-19 (×2): 10 mg via INTRAVENOUS

## 2019-10-19 MED ORDER — FLUDROCORTISONE ACETATE 0.1 MG PO TABS
0.1000 mg | ORAL_TABLET | Freq: Three times a day (TID) | ORAL | Status: DC | PRN
  Administered 2019-10-23: 0.1 mg via ORAL
  Filled 2019-10-19 (×2): qty 1

## 2019-10-19 MED ORDER — INSULIN ASPART 100 UNIT/ML ~~LOC~~ SOLN
0.0000 [IU] | Freq: Three times a day (TID) | SUBCUTANEOUS | Status: DC
  Administered 2019-10-19: 8 [IU] via SUBCUTANEOUS
  Filled 2019-10-19: qty 1

## 2019-10-19 MED ORDER — INSULIN ASPART 100 UNIT/ML ~~LOC~~ SOLN
SUBCUTANEOUS | Status: AC
  Administered 2019-10-19: 7 [IU] via SUBCUTANEOUS
  Filled 2019-10-19: qty 1

## 2019-10-19 MED ORDER — MAGNESIUM HYDROXIDE 400 MG/5ML PO SUSP: 30.0000 mL | Freq: Every day | ORAL | Status: DC | PRN

## 2019-10-19 MED ORDER — PROPOFOL 10 MG/ML IV BOLUS
INTRAVENOUS | Status: AC
  Filled 2019-10-19: qty 20

## 2019-10-19 MED ORDER — PANTOPRAZOLE SODIUM 40 MG PO TBEC
40.0000 mg | DELAYED_RELEASE_TABLET | Freq: Every day | ORAL | Status: DC
  Administered 2019-10-20 – 2019-10-24 (×5): 40 mg via ORAL
  Filled 2019-10-19 (×5): qty 1

## 2019-10-19 MED ORDER — KETOROLAC TROMETHAMINE 30 MG/ML IJ SOLN
30.0000 mg | Freq: Once | INTRAMUSCULAR | Status: AC
  Administered 2019-10-19: 14:00:00 30 mg via INTRAVENOUS

## 2019-10-19 MED ORDER — MIDAZOLAM HCL 2 MG/2ML IJ SOLN
INTRAMUSCULAR | Status: AC
  Filled 2019-10-19: qty 2

## 2019-10-19 MED ORDER — FENTANYL CITRATE (PF) 100 MCG/2ML IJ SOLN
INTRAMUSCULAR | Status: DC | PRN
  Administered 2019-10-19 (×2): 50 ug via INTRAVENOUS

## 2019-10-19 MED ORDER — PHENYLEPHRINE HCL (PRESSORS) 10 MG/ML IV SOLN
INTRAVENOUS | Status: AC
  Filled 2019-10-19: qty 1

## 2019-10-19 MED ORDER — CEFAZOLIN SODIUM-DEXTROSE 2-4 GM/100ML-% IV SOLN
2.0000 g | INTRAVENOUS | Status: AC
  Administered 2019-10-19: 2 g via INTRAVENOUS

## 2019-10-19 MED ORDER — HYDRALAZINE HCL 10 MG PO TABS
10.0000 mg | ORAL_TABLET | Freq: Four times a day (QID) | ORAL | Status: DC | PRN
  Administered 2019-10-20: 10 mg via ORAL
  Filled 2019-10-19 (×2): qty 1

## 2019-10-19 MED ORDER — ACETAMINOPHEN 10 MG/ML IV SOLN
INTRAVENOUS | Status: AC
  Filled 2019-10-19: qty 100

## 2019-10-19 MED ORDER — BUPIVACAINE-EPINEPHRINE 0.5% -1:200000 IJ SOLN
INTRAMUSCULAR | Status: DC | PRN
  Administered 2019-10-19: 30 mL

## 2019-10-19 MED ORDER — BISACODYL 10 MG RE SUPP
10.0000 mg | Freq: Every day | RECTAL | Status: DC | PRN
  Administered 2019-10-21: 10 mg via RECTAL
  Filled 2019-10-19: qty 1

## 2019-10-19 MED ORDER — FENTANYL CITRATE (PF) 100 MCG/2ML IJ SOLN: 25.0000 ug | INTRAMUSCULAR | Status: DC | PRN

## 2019-10-19 MED ORDER — ACETAMINOPHEN 325 MG PO TABS
325.0000 mg | ORAL_TABLET | Freq: Four times a day (QID) | ORAL | Status: DC | PRN
  Administered 2019-10-23 – 2019-10-24 (×2): 650 mg via ORAL
  Filled 2019-10-19 (×2): qty 2

## 2019-10-19 SURGICAL SUPPLY — 58 items
ANCH SUT 5.5 KNTLS (Anchor) ×2 IMPLANT
ANCH SUT KNTLS STRL SHLDR SYS (Anchor) ×1 IMPLANT
ANCH SUT Q-FX 2.8 (Anchor) ×4 IMPLANT
ANCHOR ALL-SUT Q-FIX 2.8 (Anchor) ×4 IMPLANT
ANCHOR HEALICOIL REGEN 5.5 (Anchor) ×2 IMPLANT
ANCHOR SUT QUATTRO KNTLS 4.5 (Anchor) ×1 IMPLANT
APL PRP STRL LF DISP 70% ISPRP (MISCELLANEOUS) ×1
BIT DRILL JUGRKNT W/NDL BIT2.9 (DRILL) IMPLANT
BLADE FULL RADIUS 3.5 (BLADE) ×2 IMPLANT
BUR ACROMIONIZER 4.0 (BURR) ×2 IMPLANT
CANNULA SHAVER 8MMX76MM (CANNULA) ×2 IMPLANT
CHLORAPREP W/TINT 26 (MISCELLANEOUS) ×2 IMPLANT
COVER MAYO STAND REUSABLE (DRAPES) ×2 IMPLANT
COVER WAND RF STERILE (DRAPES) ×2 IMPLANT
DILATOR 5.5 THREADED HEALICOIL (MISCELLANEOUS) ×1 IMPLANT
DRAPE IMP U-DRAPE 54X76 (DRAPES) ×4 IMPLANT
DRAPE SPLIT 6X30 W/TAPE (DRAPES) ×4 IMPLANT
DRILL JUGGERKNOT W/NDL BIT 2.9 (DRILL)
ELECT CAUTERY BLADE TIP 2.5 (TIP) ×2
ELECT REM PT RETURN 9FT ADLT (ELECTROSURGICAL) ×2
ELECTRODE CAUTERY BLDE TIP 2.5 (TIP) ×1 IMPLANT
ELECTRODE REM PT RTRN 9FT ADLT (ELECTROSURGICAL) ×1 IMPLANT
GAUZE SPONGE 4X4 12PLY STRL (GAUZE/BANDAGES/DRESSINGS) ×2 IMPLANT
GAUZE XEROFORM 1X8 LF (GAUZE/BANDAGES/DRESSINGS) ×2 IMPLANT
GLOVE BIO SURGEON STRL SZ7.5 (GLOVE) ×4 IMPLANT
GLOVE BIO SURGEON STRL SZ8 (GLOVE) ×4 IMPLANT
GLOVE BIOGEL PI IND STRL 8 (GLOVE) ×1 IMPLANT
GLOVE BIOGEL PI INDICATOR 8 (GLOVE) ×1
GLOVE INDICATOR 8.0 STRL GRN (GLOVE) ×2 IMPLANT
GOWN STRL REUS W/ TWL LRG LVL3 (GOWN DISPOSABLE) ×1 IMPLANT
GOWN STRL REUS W/ TWL XL LVL3 (GOWN DISPOSABLE) ×1 IMPLANT
GOWN STRL REUS W/TWL LRG LVL3 (GOWN DISPOSABLE) ×2
GOWN STRL REUS W/TWL XL LVL3 (GOWN DISPOSABLE) ×2
GRASPER SUT 15 45D LOW PRO (SUTURE) IMPLANT
IV LACTATED RINGER IRRG 3000ML (IV SOLUTION) ×4
IV LR IRRIG 3000ML ARTHROMATIC (IV SOLUTION) ×2 IMPLANT
KIT CANNULA 8X76-LX IN CANNULA (CANNULA) ×1 IMPLANT
KIT SUTURE 2.8 Q-FIX DISP (MISCELLANEOUS) ×1 IMPLANT
MANIFOLD NEPTUNE II (INSTRUMENTS) ×2 IMPLANT
MASK FACE SPIDER DISP (MASK) ×2 IMPLANT
MAT ABSORB  FLUID 56X50 GRAY (MISCELLANEOUS) ×1
MAT ABSORB FLUID 56X50 GRAY (MISCELLANEOUS) ×1 IMPLANT
NDL MAYO 6 CRC TAPER PT (NEEDLE) IMPLANT
NEEDLE MAYO 6 CRC TAPER PT (NEEDLE) ×2 IMPLANT
PACK ARTHROSCOPY SHOULDER (MISCELLANEOUS) ×2 IMPLANT
PASSER SUT FIRSTPASS SELF (INSTRUMENTS) ×1 IMPLANT
SLING ARM LRG DEEP (SOFTGOODS) ×2 IMPLANT
SLING ULTRA II LG (MISCELLANEOUS) ×2 IMPLANT
STAPLER SKIN PROX 35W (STAPLE) ×2 IMPLANT
STRAP SAFETY 5IN WIDE (MISCELLANEOUS) ×2 IMPLANT
SUT ETHIBOND 0 MO6 C/R (SUTURE) ×2 IMPLANT
SUT ULTRABRAID 2 COBRAID 38 (SUTURE) ×4 IMPLANT
SUT VIC AB 2-0 CT1 27 (SUTURE) ×6
SUT VIC AB 2-0 CT1 TAPERPNT 27 (SUTURE) ×2 IMPLANT
TAPE MICROFOAM 4IN (TAPE) ×2 IMPLANT
TUBING ARTHRO INFLOW-ONLY STRL (TUBING) ×2 IMPLANT
TUBING CONNECTING 10 (TUBING) ×2 IMPLANT
WAND WEREWOLF FLOW 90D (MISCELLANEOUS) ×2 IMPLANT

## 2019-10-19 NOTE — Anesthesia Procedure Notes (Signed)
Anesthesia Regional Block: Interscalene brachial plexus block   Pre-Anesthetic Checklist: ,, timeout performed, Correct Patient, Correct Site, Correct Laterality, Correct Procedure, Correct Position, site marked, Risks and benefits discussed,  Surgical consent,  Pre-op evaluation,  At surgeon's request and post-op pain management  Laterality: Upper and Left  Prep: chloraprep       Needles:  Injection technique: Single-shot  Needle Type: Stimiplex     Needle Length: 5cm  Needle Gauge: 22     Additional Needles:   Procedures:,,,, ultrasound used (permanent image in chart),,,,  Narrative:  Start time: 10/19/2019 10:35 AM End time: 10/19/2019 10:38 AM Injection made incrementally with aspirations every 5 mL.  Performed by: Personally  Anesthesiologist: Aldina Porta, Precious Haws, MD  Additional Notes: Patient consented for risk and benefits of nerve block including but not limited to nerve damage, failed block, bleeding and infection.  Patient voiced understanding.  Functioning IV was confirmed and monitors were applied.  A 65m 22ga Stimuplex needle was used. Sterile prep,hand hygiene and sterile gloves were used.  Minimal sedation used for procedure.  No paresthesia endorsed by patient during the procedure.  Negative aspiration and negative test dose prior to incremental administration of local anesthetic. The patient tolerated the procedure well with no immediate complications.

## 2019-10-19 NOTE — H&P (Signed)
Paper H&P to be scanned into permanent record. H&P reviewed and patient re-examined. No changes. 

## 2019-10-19 NOTE — Anesthesia Post-op Follow-up Note (Signed)
Anesthesia QCDR form completed.        
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
## 2019-10-19 NOTE — Transfer of Care (Addendum)
Immediate Anesthesia Transfer of Care Note  Patient: [name]  Procedure(s) Performed: SHOULDER ARTHROSCOPY WITH DEBRIDEMENT, DECOMPRESSION, AND MASSIVE ROTATOR CUFF REPAIR. (Left )  Patient Location: PACU  Anesthesia Type:GA combined with regional for post-op pain  Level of Consciousness: drowsy  Airway & Oxygen Therapy: Patient Spontanous Breathing and Patient connected to face mask oxygen  Post-op Assessment: Report given to RN and Post -op Vital signs reviewed and stable  Post vital signs: Reviewed and stable  Last Vitals:  Vitals Value Taken Time  BP 122/69 10/19/19 1331  Temp    Pulse 83 10/19/19 1336  Resp 9 10/19/19 1336  SpO2 100 % 10/19/19 1336  Vitals shown include unvalidated device data.  Last Pain:  Vitals:   10/19/19 1333  TempSrc:   PainSc: (P) Asleep         Complications: No apparent anesthesia complications

## 2019-10-19 NOTE — Anesthesia Procedure Notes (Signed)
Procedure Name: Intubation Date/Time: 10/19/2019 10:59 AM Performed by: Eben Burow, CRNA Pre-anesthesia Checklist: Patient identified, Emergency Drugs available, Suction available and Patient being monitored Patient Re-evaluated:Patient Re-evaluated prior to induction Oxygen Delivery Method: Circle system utilized Preoxygenation: Pre-oxygenation with 100% oxygen Induction Type: IV induction Ventilation: Mask ventilation without difficulty Laryngoscope Size: McGraph and 4 Grade View: Grade I Tube type: Oral Tube size: 8.0 mm Number of attempts: 1 Airway Equipment and Method: Stylet,  LTA kit utilized and Video-laryngoscopy Placement Confirmation: positive ETCO2 and breath sounds checked- equal and bilateral Secured at: 22 cm Tube secured with: Tape Dental Injury: Teeth and Oropharynx as per pre-operative assessment
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
## 2019-10-19 NOTE — Op Note (Signed)
10/19/2019  1:48 PM  Patient:   [name]  Pre-Op Diagnosis:   Massive rotator cuff tear, left shoulder.  Post-Op Diagnosis:   Same.  Procedure:   Limited arthroscopic debridement, arthroscopic subacromial decompression, and repair of massive rotator cuff tear, right shoulder.  Anesthesia:   General endotracheal with interscalene block using Exparel placed preoperatively by the anesthesiologist.  Surgeon:   Pascal Lux, MD  Assistant:   Cameron Proud, PA-C; Larrie Kass, PA-S  Findings:   As above.  The supraspinatus, subscapularis, and anterior portion of the infraspinatus tendons all were torn and retracted to the level of the glenoid.  The biceps tendon had chronically torn and retracted distally.  The articular surfaces of the glenoid and humerus both were in satisfactory condition.  There was mild fraying of the labrum without frank detachment from the glenoid.  Complications:   None  Fluids:   750 cc  Estimated blood loss:   25 cc  Tourniquet time:   None  Drains:   None  Closure:   Staples      Brief clinical note:   The patient is a 48 year old male with a history of progressively worsening left shoulder pain. The patient's symptoms have progressed despite medications, activity modification, etc. The patient's history and examination are consistent with impingement/tendinopathy with a rotator cuff tear. These findings were confirmed by MRI scan. The patient presents at this time for definitive management of these shoulder symptoms.  Procedure:   The patient underwent placement of an interscalene block using Exparel by the anesthesiologist in the preoperative holding area before being brought into the operating room and lain in the supine position. The patient then underwent general endotracheal intubation and anesthesia before being repositioned in the beach chair position using the beach chair positioner. The left shoulder and upper extremity were prepped with  ChloraPrep solution before being draped sterilely. Preoperative antibiotics were administered. A timeout was performed to confirm the proper surgical site before the expected portal sites and incision site were injected with 0.5% Sensorcaine with epinephrine. A posterior portal was created and the glenohumeral joint thoroughly inspected with the findings as described above. An anterior portal was created using an outside-in technique. The labrum and rotator cuff were further probed, again confirming the above-noted findings. The frayed margins of the labrum and torn margins of the rotator cuff all were debrided back to stable margins using the full-radius resector. The ArthroCare wand was inserted and used to obtain hemostasis as well as to "anneal" the labrum superiorly and anteriorly. The instruments were removed from the joint after suctioning the excess fluid.  The camera was repositioned through the posterior portal into the subacromial space. A separate lateral portal was created using an outside-in technique. The 3.5 mm full-radius resector was introduced and used to perform a subtotal bursectomy. The ArthroCare wand was then inserted and used to remove the periosteal tissue off the undersurface of the anterior third of the acromion as well as to recess the coracoacromial ligament from its attachment along the anterior and lateral margins of the acromion. There was no significant spurring on the undersurface of the acromion, so no formal bony debridement was performed. The instruments were then removed from the subacromial space after suctioning the excess fluid.  An approximately 4-5 cm incision was made over the anterolateral aspect of the shoulder beginning at the anterolateral corner of the acromion and extending distally in line with the bicipital groove. This incision was carried down through the subcutaneous  tissues to expose the deltoid fascia. The raphae between the anterior and middle thirds was  identified and this plane developed to provide access into the subacromial space. Additional bursal tissues were debrided sharply using Metzenbaum scissors. The rotator cuff tear was readily identified. An attempt was made to retrieve the subscapularis tendon through this incision, but it had retracted too far medially and was unable to be retrieved. Therefore, it was elected to make a second incision.    The second incision was made over the anterior aspect of the shoulder beginning at the coracoid process and extending distally. The incision was carried down through the subcutaneous tissues to expose the deltopectoral interval. The cephalic vein was identified and retracted laterally along with the deltoid muscle. The deltopectoral interval was developed bluntly to expose the conjoined tendon which was retracted medially. The torn end of the subscapularis tendon was identified. Several tagging sutures were placed through it and dissection was carried out superficially and deep to the tendon, freeing up adhesions in order to allow it to be brought back laterally to reach the lesser tuberosity. The margins were debrided sharply with a #15 blade and the exposed greater tuberosity roughened with a rongeur. The tear was repaired using two [employer] 2.8 mm Q-Fix anchors. These sutures were then brought back laterally and secured using a single [place] QuatroLink anchors to create a two-layer closure.  Attention was redirected to the more lateral incision. Several tagging sutures were placed through the free margin of the supraspinatus and infraspinatus tendons. Again adhesions were released superficially and deep to the tendon in order to fully mobilize it. Several side-to-side sutures were placed to close the longitudinal portion of the tear posteriorly. Laterally, the tear was repaired using two [employer]hew 2.8 mm Q-Fix anchors. These sutures were then brought back laterally and secured using two [place] knotless Regenasorb anchors to create a two-layer closure.  In addition, a #2 FiberWire was placed in a side to side fashion to close the rotator interval, reattaching the anterior margin of the supraspinatus tendon to the superior margin of the subscapularis tendon.  The wounds were copiously irrigated with sterile saline solution before the deltoid raphae and deltopectoral interval were reapproximated respectively using 2-0 Vicryl interrupted sutures. The subcutaneous tissues were closed in two layers using 2-0 Vicryl interrupted sutures before the skin was closed using staples. The portal sites also were closed using staples. A sterile bulky dressing was applied to the shoulder before the arm was placed into a shoulder immobilizer. The patient was then awakened, extubated, and returned to the recovery room in satisfactory condition after tolerating the procedure well.

## 2019-10-19 NOTE — Anesthesia Postprocedure Evaluation (Signed)
Anesthesia Post Note  Patient: [name]  Procedure(s) Performed: SHOULDER ARTHROSCOPY WITH DEBRIDEMENT, DECOMPRESSION, AND MASSIVE ROTATOR CUFF REPAIR. (Left )  Patient location during evaluation: PACU Anesthesia Type: General Level of consciousness: awake and alert Pain management: pain level controlled Vital Signs Assessment: post-procedure vital signs reviewed and stable Respiratory status: spontaneous breathing, nonlabored ventilation, respiratory function stable and patient connected to nasal cannula oxygen Cardiovascular status: blood pressure returned to baseline and stable Postop Assessment: no apparent nausea or vomiting Anesthetic complications: no     Last Vitals:  Vitals:   10/19/19 1457 10/19/19 1501  BP:    Pulse: 96 89  Resp: 14 10  Temp:    SpO2: 96% 95%    Last Pain:  Vitals:   10/19/19 1501  TempSrc:   PainSc: 0-No pain                 Precious Haws Piscitello

## 2019-10-20 ENCOUNTER — Encounter: Payer: Self-pay | Admitting: Surgery

## 2019-10-20 DIAGNOSIS — F17220 Nicotine dependence, chewing tobacco, uncomplicated: Secondary | ICD-10-CM | POA: Diagnosis present

## 2019-10-20 DIAGNOSIS — K5900 Constipation, unspecified: Secondary | ICD-10-CM | POA: Diagnosis not present

## 2019-10-20 DIAGNOSIS — M75102 Unspecified rotator cuff tear or rupture of left shoulder, not specified as traumatic: Secondary | ICD-10-CM | POA: Diagnosis present

## 2019-10-20 DIAGNOSIS — Z981 Arthrodesis status: Secondary | ICD-10-CM | POA: Diagnosis not present

## 2019-10-20 DIAGNOSIS — R030 Elevated blood-pressure reading, without diagnosis of hypertension: Secondary | ICD-10-CM | POA: Diagnosis present

## 2019-10-20 DIAGNOSIS — L89150 Pressure ulcer of sacral region, unstageable: Secondary | ICD-10-CM | POA: Diagnosis present

## 2019-10-20 DIAGNOSIS — Z79899 Other long term (current) drug therapy: Secondary | ICD-10-CM | POA: Diagnosis not present

## 2019-10-20 DIAGNOSIS — E1065 Type 1 diabetes mellitus with hyperglycemia: Secondary | ICD-10-CM | POA: Diagnosis present

## 2019-10-20 DIAGNOSIS — G4700 Insomnia, unspecified: Secondary | ICD-10-CM | POA: Diagnosis not present

## 2019-10-20 DIAGNOSIS — D62 Acute posthemorrhagic anemia: Secondary | ICD-10-CM

## 2019-10-20 DIAGNOSIS — E876 Hypokalemia: Secondary | ICD-10-CM | POA: Diagnosis not present

## 2019-10-20 DIAGNOSIS — E871 Hypo-osmolality and hyponatremia: Secondary | ICD-10-CM | POA: Diagnosis present

## 2019-10-20 DIAGNOSIS — G8220 Paraplegia, unspecified: Secondary | ICD-10-CM | POA: Diagnosis not present

## 2019-10-20 DIAGNOSIS — E1069 Type 1 diabetes mellitus with other specified complication: Secondary | ICD-10-CM | POA: Diagnosis not present

## 2019-10-20 DIAGNOSIS — D649 Anemia, unspecified: Secondary | ICD-10-CM | POA: Diagnosis present

## 2019-10-20 DIAGNOSIS — G8254 Quadriplegia, C5-C7 incomplete: Secondary | ICD-10-CM | POA: Diagnosis present

## 2019-10-20 DIAGNOSIS — K219 Gastro-esophageal reflux disease without esophagitis: Secondary | ICD-10-CM | POA: Diagnosis present

## 2019-10-20 DIAGNOSIS — Z794 Long term (current) use of insulin: Secondary | ICD-10-CM | POA: Diagnosis not present

## 2019-10-20 DIAGNOSIS — Z20828 Contact with and (suspected) exposure to other viral communicable diseases: Secondary | ICD-10-CM | POA: Diagnosis present

## 2019-10-20 DIAGNOSIS — I10 Essential (primary) hypertension: Secondary | ICD-10-CM | POA: Diagnosis present

## 2019-10-20 DIAGNOSIS — Z23 Encounter for immunization: Secondary | ICD-10-CM | POA: Diagnosis not present

## 2019-10-20 DIAGNOSIS — N390 Urinary tract infection, site not specified: Secondary | ICD-10-CM | POA: Diagnosis not present

## 2019-10-20 DIAGNOSIS — L89109 Pressure ulcer of unspecified part of back, unspecified stage: Secondary | ICD-10-CM | POA: Insufficient documentation

## 2019-10-20 LAB — BASIC METABOLIC PANEL
Anion gap: 11 (ref 5–15)
BUN: 8 mg/dL (ref 6–20)
CO2: 23 mmol/L (ref 22–32)
Calcium: 8.4 mg/dL — ABNORMAL LOW (ref 8.9–10.3)
Chloride: 93 mmol/L — ABNORMAL LOW (ref 98–111)
Creatinine, Ser: 0.66 mg/dL (ref 0.61–1.24)
GFR calc Af Amer: >60 mL/min (ref >60)
GFR calc non Af Amer: >60 mL/min (ref >60)
Glucose, Bld: 300 mg/dL — ABNORMAL HIGH (ref 70–99)
Potassium: 4.2 mmol/L (ref 3.5–5.1)
Sodium: 127 mmol/L — ABNORMAL LOW (ref 135–145)

## 2019-10-20 LAB — GLUCOSE, CAPILLARY
Glucose-Capillary: 292 mg/dL — ABNORMAL HIGH (ref 70–99)
Glucose-Capillary: 304 mg/dL — ABNORMAL HIGH (ref 70–99)
Glucose-Capillary: 310 mg/dL — ABNORMAL HIGH (ref 70–99)
Glucose-Capillary: 260 mg/dL — ABNORMAL HIGH (ref 70–99)
Glucose-Capillary: 167 mg/dL — ABNORMAL HIGH (ref 70–99)

## 2019-10-20 MED ORDER — INSULIN ASPART 100 UNIT/ML ~~LOC~~ SOLN
1.0000 [IU] | Freq: Three times a day (TID) | SUBCUTANEOUS | Status: DC
  Administered 2019-10-20: 6 [IU] via SUBCUTANEOUS
  Administered 2019-10-20: 9 [IU] via SUBCUTANEOUS
  Administered 2019-10-21: 7 [IU] via SUBCUTANEOUS
  Administered 2019-10-21: 6 [IU] via SUBCUTANEOUS
  Administered 2019-10-21: 4 [IU] via SUBCUTANEOUS
  Administered 2019-10-22: 10 [IU] via SUBCUTANEOUS
  Administered 2019-10-22: 6 [IU] via SUBCUTANEOUS
  Administered 2019-10-22: 5 [IU] via SUBCUTANEOUS
  Administered 2019-10-23: 3 [IU] via SUBCUTANEOUS
  Administered 2019-10-23: 6 [IU] via SUBCUTANEOUS
  Administered 2019-10-23: 9 [IU] via SUBCUTANEOUS
  Administered 2019-10-24: 1 [IU] via SUBCUTANEOUS
  Administered 2019-10-24: 5 [IU] via SUBCUTANEOUS
  Filled 2019-10-20 (×12): qty 1

## 2019-10-20 MED ORDER — MAGNESIUM SULFATE IN D5W 1-5 GM/100ML-% IV SOLN
1.0000 g | Freq: Once | INTRAVENOUS | Status: AC
  Administered 2019-10-20: 1 g via INTRAVENOUS
  Filled 2019-10-20: qty 100

## 2019-10-20 MED ORDER — INSULIN ASPART 100 UNIT/ML ~~LOC~~ SOLN
1.0000 [IU] | Freq: Three times a day (TID) | SUBCUTANEOUS | Status: DC
  Administered 2019-10-20: 4 [IU] via SUBCUTANEOUS
  Administered 2019-10-23: 2 [IU] via SUBCUTANEOUS
  Administered 2019-10-23: 4 [IU] via SUBCUTANEOUS
  Administered 2019-10-23 – 2019-10-24 (×2): 1 [IU] via SUBCUTANEOUS
  Filled 2019-10-20 (×7): qty 1

## 2019-10-20 MED ORDER — INSULIN ASPART 100 UNIT/ML ~~LOC~~ SOLN
2.0000 [IU] | Freq: Once | SUBCUTANEOUS | Status: AC
  Administered 2019-10-20: 2 [IU] via INTRAVENOUS
  Filled 2019-10-20: qty 0.02

## 2019-10-20 MED ORDER — INSULIN ASPART 100 UNIT/ML ~~LOC~~ SOLN
2.0000 [IU] | Freq: Once | SUBCUTANEOUS | Status: AC
  Administered 2019-10-20: 2 [IU] via INTRAVENOUS
  Filled 2019-10-20 (×3): qty 0.02

## 2019-10-20 MED ORDER — HYDRALAZINE HCL 20 MG/ML IJ SOLN
5.0000 mg | INTRAMUSCULAR | Status: DC | PRN
  Administered 2019-10-20 – 2019-10-23 (×4): 5 mg via INTRAVENOUS
  Filled 2019-10-20 (×5): qty 0.25

## 2019-10-20 MED ORDER — OXYCODONE HCL 5 MG PO TABS: 5.0000 mg | ORAL_TABLET | ORAL | 0 refills | Status: DC | PRN

## 2019-10-20 MED ORDER — MAGNESIUM SULFATE 50 % IJ SOLN: 1.0000 g | Freq: Once | INTRAMUSCULAR | Status: DC

## 2019-10-20 MED ORDER — SODIUM CHLORIDE 0.9 % IV SOLN
INTRAVENOUS | Status: DC
  Administered 2019-10-20: 22:00:00 via INTRAVENOUS
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
## 2019-10-20 NOTE — NC FL2 (Signed)
[place] LEVEL OF CARE SCREENING TOOL     IDENTIFICATION  Patient Name: [name] Birthdate: 1971-04-06 Sex: male Admission Date (Current Location): 10/19/2019  [place] and [place] Number:  Engineering geologist and Address:  [facility], [address] [place], [place] [address]      Provider Number: [id number]  Attending Physician Name and Address:  Corky Mull, MD  Relative Name and Phone Number:  Hoyle Sauer Mother [phone]    Current Level of Care: Hospital Recommended Level of Care: [place] Prior Approval Number:    Date Approved/Denied:   PASRR Number: [id number]  Discharge Plan: SNF    Current Diagnoses: Patient Active Problem List   Diagnosis Date Noted  . Pressure injury of skin 10/20/2019  . Elevated blood pressure reading without diagnosis of hypertension 10/20/2019  . Hypomagnesemia 10/20/2019  . Rotator cuff tear 10/19/2019  . Muscle cramps 04/01/2019  . Hyponatremia 12/25/2018  . Healthcare maintenance 05/05/2018  . Depression, recurrent ([place]) 08/09/2017  . Skin ulcer ([place]) 06/05/2016  . Paraplegia ([place]) 06/05/2016  . Trigger finger of right hand 12/22/2015  . Food allergy 09/10/2014  . Anemia 08/20/2013  . Alcohol abuse 08/20/2013  . Edema 05/21/2013  . Syncope 12/15/2012  . Hypotension 11/27/2012  . Type 1 diabetes mellitus ([place]) 11/24/2012  . History of frequent urinary tract infections 11/24/2012    Orientation RESPIRATION BLADDER Height & Weight     Self, Time, Situation, Place  Normal Indwelling catheter Weight: 80 kg Height:  6' 1"  (185.4 cm)  BEHAVIORAL SYMPTOMS/MOOD NEUROLOGICAL BOWEL NUTRITION STATUS      Incontinent    AMBULATORY STATUS COMMUNICATION OF NEEDS Skin   Extensive Assist Verbally Surgical wounds                       Personal Care Assistance Level of Assistance  Dressing     Dressing Assistance: Limited assistance     Functional  Limitations Info             SPECIAL CARE FACTORS FREQUENCY                       Contractures Contractures Info: Not present    Additional Factors Info  Allergies   Allergies Info: Decongestant Pseudoephedrine Hcl, Ivp Dye Iodinated Diagnostic Agents, Sulfa Antibiotics           Current Medications (10/20/2019):  This is the current hospital active medication list Current Facility-Administered Medications  Medication Dose Route Frequency Provider Last Rate Last Dose  . 0.9 %  sodium chloride infusion   Intravenous Continuous Poggi, Marshall Cork, MD 75 mL/hr at 10/20/19 0421    . acetaminophen (TYLENOL) tablet 1,000 mg  1,000 mg Oral Q6H Poggi, Marshall Cork, MD   1,000 mg at 10/20/19 0618  . acetaminophen (TYLENOL) tablet 325-650 mg  325-650 mg Oral Q6H PRN Poggi, Marshall Cork, MD      . baclofen (LIORESAL) tablet 40 mg  40 mg Oral TID Corky Mull, MD   40 mg at 10/19/19 2314  . bisacodyl (DULCOLAX) suppository 10 mg  10 mg Rectal Daily PRN Poggi, Marshall Cork, MD      . diphenhydrAMINE (BENADRYL) 12.5 MG/5ML elixir 12.5-25 mg  12.5-25 mg Oral Q4H PRN Poggi, Marshall Cork, MD      . docusate sodium (COLACE) capsule 100 mg  100 mg Oral BID Poggi, Marshall Cork, MD   100 mg at 10/19/19  2309  . enoxaparin (LOVENOX) injection 40 mg  40 mg Subcutaneous Q24H Poggi, Marshall Cork, MD      . fludrocortisone (FLORINEF) tablet 0.1 mg  0.1 mg Oral TID PRN Poggi, Marshall Cork, MD      . hydrALAZINE (APRESOLINE) injection 5 mg  5 mg Intravenous Q4H PRN Florina Ou V, MD      . HYDROmorphone (DILAUDID) injection 0.25-0.5 mg  0.25-0.5 mg Intravenous Q2H PRN Poggi, Marshall Cork, MD      . insulin aspart (novoLOG) injection 2 Units  2 Units Intravenous Once Florina Ou V, MD      . insulin detemir (LEVEMIR) injection 11 Units  11 Units Subcutaneous QHS Corky Mull, MD   11 Units at 10/19/19 2312  . magnesium hydroxide (MILK OF MAGNESIA) suspension 30 mL  30 mL Oral Daily PRN Poggi, Marshall Cork, MD      . magnesium sulfate IVPB 1 g 100 mL  1 g  Intravenous Once Para Skeans, MD      . methenamine (MANDELAMINE) tablet 1,000 mg  1,000 mg Oral BID Poggi, Marshall Cork, MD   1,000 mg at 10/19/19 2308  . metoCLOPramide (REGLAN) tablet 5-10 mg  5-10 mg Oral Q8H PRN Poggi, Marshall Cork, MD       Or  . metoCLOPramide (REGLAN) injection 5-10 mg  5-10 mg Intravenous Q8H PRN Poggi, Marshall Cork, MD      . naphazoline-glycerin (CLEAR EYES REDNESS) ophth solution 1-2 drop  1-2 drop Both Eyes QID PRN Poggi, Marshall Cork, MD      . ondansetron (ZOFRAN) tablet 4 mg  4 mg Oral Q6H PRN Poggi, Marshall Cork, MD       Or  . ondansetron (ZOFRAN) injection 4 mg  4 mg Intravenous Q6H PRN Poggi, Marshall Cork, MD      . oxyCODONE (Oxy IR/ROXICODONE) immediate release tablet 5-10 mg  5-10 mg Oral Q4H PRN Poggi, Marshall Cork, MD      . pantoprazole (PROTONIX) EC tablet 40 mg  40 mg Oral Daily Poggi, Marshall Cork, MD      . phenylephrine ((USE for PREPARATION-H)) 0.25 % suppository 1 suppository  1 suppository Rectal BID Poggi, Marshall Cork, MD      . sodium phosphate (FLEET) 7-19 GM/118ML enema 1 enema  1 enema Rectal Once PRN Poggi, Marshall Cork, MD      . sucralfate (CARAFATE) tablet 1 g  1 g Oral TID AC Poggi, Marshall Cork, MD      . traMADol (ULTRAM) tablet 50 mg  50 mg Oral Q6H PRN Poggi, Marshall Cork, MD      . vitamin B-12 (CYANOCOBALAMIN) tablet 2,500 mcg  2,500 mcg Oral Daily PRN Poggi, Marshall Cork, MD      . vitamin C (ASCORBIC ACID) tablet 1,000 mg  1,000 mg Oral BID Poggi, Marshall Cork, MD   1,000 mg at 10/19/19 2310     Discharge Medications: Please see discharge summary for a list of discharge medications.  Relevant Imaging Results:  Relevant Lab Results:   Additional Information [id number]  Su Hilt, RN

## 2019-10-20 NOTE — Evaluation (Signed)
Occupational Therapy Evaluation Patient Details Name: [name] MRN: [id number] DOB: 09-Mar-1971 Today's Date: 10/20/2019    History of Present Illness [name] is a 54yoM who presents after Left rotator cuff repair. Pt sustained recent trauma to left shoulder with multiple full thickness tears. 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   Clinical Impression   Patient was seen for an OT evaluation this date. Pt lives alone and modified independent with ADL and functional mobility from w/c level prior to surgery. Pt eager to return to PLOF. Pt has orders for LUE to be immobilized and will be NWBing per MD. Patient presents with impaired strength/ROM, pain, and sensation to LUE. These impairments result in a decreased ability to perform self care tasks requiring Max assist for UB/LB dressing, bathing, Min-Mod A for grooming,  and set up for self feeding. Pt instructed in sling/immobilizer mgt, LUE precautions, adaptive strategies for bathing/dressing/toileting/grooming, and positioning and considerations for sleep. OT adjusted sling/immobilizer to improve comfort, optimize positioning, and to maximize skin integrity/safety. Pt verbalized understanding of all education/training provided. Pt will benefit from skilled OT services to address these limitations and improve independence in daily tasks. Pt currently requires significant level of assist for all aspects of ADL and mobility due to inability to functionally use LUE. Pt will require SNF level care and rehab services to continue therapy to maximize return to PLOF, address home/routines modifications and safety, minimize falls risk, and minimize caregiver burden.      Follow Up Recommendations  SNF    Equipment Recommendations  Other (comment)(TBD)    Recommendations for Other Services       Precautions / Restrictions Precautions Precautions: Shoulder;Fall Shoulder  Interventions: Shoulder sling/immobilizer;At all times;Off for dressing/bathing/exercises Precaution Booklet Issued: No Restrictions Weight Bearing Restrictions: Yes LUE Weight Bearing: Non weight bearing      Mobility Bed Mobility Overal bed mobility: (pt repositioning as able in bed to prepare for breakfast.)             General bed mobility comments: deferred; mechnical lift is most appropriate at this time given +2-3 status and need to protect LUE  Transfers                      Balance                                           ADL either performed or assessed with clinical judgement   ADL Overall ADL's : Needs assistance/impaired Eating/Feeding: Modified independent   Grooming: Minimal assistance   Upper Body Bathing: Maximal assistance   Lower Body Bathing: Maximal assistance   Upper Body Dressing : Maximal assistance   Lower Body Dressing: Maximal assistance   Toilet Transfer: Total assistance Toilet Transfer Details (indicate cue type and reason): hoyer lift                 Vision Patient Visual Report: No change from baseline       Perception     Praxis      Pertinent Vitals/Pain Pain Assessment: No/denies pain     Hand Dominance     Extremity/Trunk Assessment Upper Extremity Assessment Upper Extremity Assessment: RUE deficits/detail;LUE deficits/detail RUE Deficits / Details: limited sensation at the brachium; gross and fine motor impairments of digits 1 and 2; baseline  gross use of RUE for WC propulsion and self care. LUE Deficits / Details: full recovery following SCI but now very limited 2/2 shoulder surgery and ROM restrictions LUE: Unable to fully assess due to immobilization   Lower Extremity Assessment Lower Extremity Assessment: (SCI injury patient; BLE absent motor and sensory function)   Cervical / Trunk Assessment Cervical / Trunk Assessment: (no strength activation below "armpits")    Communication Communication Communication: No difficulties   Cognition Arousal/Alertness: Awake/alert Behavior During Therapy: WFL for tasks assessed/performed Overall Cognitive Status: Within Functional Limits for tasks assessed                                     General Comments       Exercises Other Exercises Other Exercises: Pt instructed in shoulder sling/immobilizer mgt including donning/doffing, wear schedule, and optimal positioning, hemi techniques for ADL, and shoulder precautions and ROM restrictions   Shoulder Instructions      Home Living Family/patient expects to be discharged to:: Private residence Living Arrangements: Alone   Type of Home: House Home Access: Ramped entrance;Elevator     Home Layout: One level     Bathroom Shower/Tub: Tub/shower unit         Home Equipment: Wheelchair - Brewing technologist;Other (comment)(slide board)   Additional Comments: HOB/FOB tilt; electric scooter attachment for WC;      Prior Functioning/Environment Level of Independence: Independent with assistive device(s)  Gait / Transfers Assistance Needed: lateral scoot transfers or slide board transfers [facility] to/from bed/car ADL's / [place] Needed: has well established modified independent self care/ADL routines 2/2 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            OT Problem List: Decreased strength;Decreased coordination;Decreased range of motion;Impaired sensation;Impaired balance (sitting and/or standing);Decreased knowledge of use of DME or AE;Impaired UE functional use      OT Treatment/Interventions: Self-care/ADL training;Therapeutic exercise;Therapeutic activities;DME and/or AE instruction;Patient/family education;Balance training    OT Goals(Current goals can be found in the care plan section) Acute Rehab OT Goals Patient Stated Goal: go to rehab and get stronger OT Goal Formulation: With patient Time For Goal  Achievement: 11/03/19 Potential to Achieve Goals: Good ADL Goals Pt Will Perform Grooming: sitting;with min assist Additional ADL Goal #1: Pt will independently instruct caregiver in shoulder sling/immobilizer mgt including donning/doffing, wear schedule, and optimal positioning. Additional ADL Goal #2: Pt will independently instruct caregivers in ADL tasks while maintaining shoulder precautions  OT Frequency: Min 1X/week   Barriers to D/C:            Co-evaluation              AM-PAC OT "6 Clicks" Daily Activity     Outcome Measure Help from another person eating meals?: None Help from another person taking care of personal grooming?: A Little Help from another person toileting, which includes using toliet, bedpan, or urinal?: A Lot Help from another person bathing (including washing, rinsing, drying)?: A Lot Help from another person to put on and taking off regular upper body clothing?: A Lot Help from another person to put on and taking off regular lower body clothing?: A Lot 6 Click Score: 15   End of Session    Activity Tolerance: Patient tolerated treatment well Patient left: in bed;with call bell/phone within reach;with bed alarm set;with nursing/sitter in room  OT Visit Diagnosis: Other abnormalities of gait and mobility (R26.89);Other  symptoms and signs involving the nervous system (R29.898)                Time: 6940-9828 OT Time Calculation (min): 24 min Charges:  OT General Charges $OT Visit: 1 Visit OT Evaluation $OT Eval Moderate Complexity: 1 Mod OT Treatments $Therapeutic Activity: 8-22 mins  Jeni Salles, MPH, MS, OTR/L ascom [phone] 10/20/19, 12:47 PM

## 2019-10-20 NOTE — Evaluation (Addendum)
Physical Therapy Evaluation Patient Details Name: KENDRA WOOLFORD MRN: [id number] DOB: 10/09/1971 Today's Date: 10/20/2019   History of Present Illness  Keny Donald is a 84yoM who presents after Left rotator cuff repair. Pt sustained recent trauma to left shoulder with multiple full thickness tears. 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  Clinical Impression  Pt admitted with above diagnosis. Pt currently with functional limitations due to the deficits listed below (see "PT Problem List"). Upon entry, pt in bed, awake and agreeable to participate. The pt is alert and oriented x4 pleasant, conversational, and generally a good historian. Pt describes his baseline mobility strategies for transferring and propulsion, most of which are high-exertion bimanual tasks due to history of paretic legs and trunk. Pt non with LUE surgical restrictions is reliant on solely RUE for all mobility needs of trunk, LUE, and BLE. It is not safe nor likely that pt will be able to perform his baseline self care and mobility in current state until his LUE is cleared for use and/or has been restored to baseline function. Not safe to attempt slide transfers at this time. Pt is mechanical lift appropriate for OOB to recliner with nursing. Pt is unable to propel WC independently 2/2 LUE surgical restrictions. Pt will need skilled care at DC to assist pt with ADL and postoperative protocol for shoulder. All education completed, and time is given to address all questions/concerns. As pt will not be cleared for LUE use in transfers and bed mobility per surgeon, pt will benefir from PT services to progress pt's ability to perform pericare/self care, dressing, bed mobility, repositioning, and pressure relief, all using a single UE strategy.         Follow Up Recommendations Follow surgeon's recommendation for DC plan and follow-up therapies;Supervision -  Intermittent;Supervision for mobility/OOB    Equipment Recommendations  None recommended by PT    Recommendations for Other Services       Precautions / Restrictions Precautions Precautions: Shoulder Shoulder Interventions: Shoulder sling/immobilizer;At all times Precaution Booklet Issued: No Restrictions LUE Weight Bearing: Non weight bearing      Mobility  Bed Mobility Overal bed mobility: (pt repositioning as able in bed to prepare for breakfast.)             General bed mobility comments: deferred; mechnical lift is most appropriate at this time given +2-3 status and need to protect LUE  Transfers                    Ambulation/Gait                Stairs            Wheelchair Mobility    Modified Rankin (Stroke Patients Only)       Balance                                             Pertinent Vitals/Pain Pain Assessment: (shoulder is pain free; pt has some back pain from being in bed)    Home Living Family/patient expects to be discharged to:: Private residence Living Arrangements: Alone   Type of Home: House Home Access: Ramped entrance;Elevator     Home Layout: One level Home Equipment: Wheelchair - Brewing technologist;Other (comment)(slide board) Additional Comments: HOB/FOB tilt; electric scooter attachment for  WC;    Prior Function Level of Independence: Independent with assistive device(s)   Gait / Transfers Assistance Needed: lateral scoot transfers or slide board transfers [facility] to/from bed/car  ADL's / [place]em[place] Assistance Needed: has well established self care/ADL routines 2/2 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        Hand Dominance        Extremity/Trunk Assessment   Upper Extremity Assessment Upper Extremity Assessment: LUE deficits/detail;RUE deficits/detail RUE Deficits / Details: limited sensation at the brachium; gross and fine motor impairments of digits 1 and 2;  baseline gross use of RUE for WC propulsion and self care. LUE Deficits / Details: full recovery following SCI LUE: Unable to fully assess due to immobilization    Lower Extremity Assessment Lower Extremity Assessment: (SCI injury patient; BLE absent motor and sensory function)    Cervical / Trunk Assessment Cervical / Trunk Assessment: (no strenght activation below "armpits")  Communication      Cognition Arousal/Alertness: Awake/alert Behavior During Therapy: WFL for tasks assessed/performed Overall Cognitive Status: Within Functional Limits for tasks assessed                                        General Comments      Exercises     Assessment/Plan    PT Assessment Patient needs continued PT services  PT Problem List Decreased range of motion;Decreased mobility;Decreased balance       PT Treatment Interventions DME instruction;Functional mobility training;Therapeutic activities;Therapeutic exercise;Balance training;Neuromuscular re-education;Patient/family education;Wheelchair mobility training;Manual techniques    PT Goals (Current goals can be found in the Care Plan section)  Acute Rehab PT Goals Patient Stated Goal: go to rehab and get stronger    Frequency Min 2X/week   Barriers to discharge Decreased caregiver support pt lives alone    Co-evaluation               AM-PAC PT "6 Clicks" Mobility  Outcome Measure Help needed turning from your back to your side while in a flat bed without using bedrails?: Total Help needed moving from lying on your back to sitting on the side of a flat bed without using bedrails?: Total Help needed moving to and from a bed to a chair (including a wheelchair)?: Total Help needed standing up from a chair using your arms (e.g., wheelchair or bedside chair)?: Total Help needed to walk in hospital room?: Total Help needed climbing 3-5 steps with a railing? : Total 6 Click Score: 6    End of Session    Activity Tolerance: Patient tolerated treatment well;No increased pain Patient left: in bed;with nursing/sitter in room;with call bell/phone within reach Nurse Communication: Mobility status;Need for lift equipment PT Visit Diagnosis: Difficulty in walking, not elsewhere classified (R26.2)    Time: 8768-1157 PT Time Calculation (min) (ACUTE ONLY): 18 min   Charges:   PT Evaluation $PT Eval Low Complexity: 1 Low          10:01 AM, 10/20/19 Etta Grandchild, PT, DPT Physical Therapist - [facility]  [phone] ([place])    Buccola,Allan C 10/20/2019, 9:55 AM

## 2019-10-20 NOTE — Progress Notes (Signed)
Pt BP elevated. MD notified. Order for hydralazine given. Will continue to monitor.

## 2019-10-20 NOTE — TOC Progression Note (Signed)
Transition of Care [facility]) - Progression Note    Patient Details  Name: [name] MRN: [id number] Date of Birth: Mar 01, 1971  [facility]) CM/SW Contact  Su Hilt, RN Phone Number: 10/20/2019, 3:05 PM  Clinical Narrative:     Spoke with the patient and reviewed each bed offer in detail, He chose [facility].  I notified Otila Kluver at [facility] that the patient has accepted the bed offer.  I called [place] and requested to start insurance [place] Ref number [phone] Faxed clinical to [phone]     Barriers to Discharge: Continued Medical Work up  Expected Discharge Plan and Services     Discharge Planning Services: CM Consult Post Acute Care Choice: [place] arrangements for the past 2 months: Single Family Home                                       Social Determinants of Health (SDOH) Interventions    Readmission Risk Interventions No flowsheet data found.
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
## 2019-10-20 NOTE — Discharge Instructions (Signed)
Keep dressing dry and intact.  Non-weightbearing to the left shoulder. May shower after dressing changed on post-op day #4 (Monday).  Cover staples/sutures with Band-Aids after drying off. Apply ice frequently to shoulder. Keep shoulder immobilizer on at all times except may remove for bathing purposes. Follow-up in 10-14 days or as scheduled.
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
## 2019-10-20 NOTE — Progress Notes (Addendum)
Inpatient Diabetes Program Recommendations  AACE/ADA: New Consensus Statement on Inpatient Glycemic Control (2015)  Target Ranges:  Prepandial:   less than 140 mg/dL      Peak postprandial:   less than 180 mg/dL (1-2 hours)      Critically ill patients:  140 - 180 mg/dL   Results for [name], [name] (MRN [id number]) as of 10/20/2019 07:57  Ref. Range 10/19/2019 09:37 10/19/2019 13:43 10/19/2019 14:37 10/19/2019 15:29 10/19/2019 16:14 10/19/2019 16:52 10/19/2019 21:08  Glucose-Capillary Latest Ref Range: 70 - 99 mg/dL 256 (H)  10 mg Decadron given at 11am 316 (H)  7 units NOVOLOG  329 (H)  6 units NOVOLOG  293 (H)  6 units NOVOLOG  264 (H) 254 (H)  8 units NOVOLOG  243 (H)    11 units LEVEMIR   Results for [name], [name] (MRN [id number]) as of 10/20/2019 11:51  Ref. Range 10/20/2019 08:22  Glucose-Capillary Latest Ref Range: 70 - 99 mg/dL 292 (H)  2 units NOVOLOG given IV at 10:43am   Results for [name], [name] (MRN [id number]) as of 10/20/2019 07:57  Ref. Range 10/19/2019 17:08  Hemoglobin A1C Latest Ref Range: 4.8 - 5.6 % 6.2 (H)     Admit with: Rotator Cuff repair  History: Type 1 diabetes  Home DM Meds: Levemir 11 units QHS       Humalog 3-150 units TID with meals  Current Orders: Levemir 11 units QHS      Novolog Moderate Correction Scale/ SSI (0-15 units) TID AC     MD- May consider increasing Levemir to 15 units QHS for tonight  Entered Novolog orders for both Correction and Meal Coverage based on how pt calculates Novolog at home     Spoke w/ pt this AM to clarify his home regimen.  Takes Levemir 11 units QHS but pt stated he will titrate his Levemir dose upward as needed based on his CBGs.  Also takes Novolog (or Humalog--whatever the insurance will cover at the time) on the following basis: 3 units Novolog for every 15 grams of Carbohydrates consumed (or in other words, 1 unit for every 5 grams carbohydrates) and Novolog 1 unit for every 50 mg/dl  above his target CBG of 120 mg/dl.  Pt stated to me that the MD told him he was going to get 4 units Novolog IV this AM but only 2 units were ordered.  Spoke w/ Dr. Posey Pronto by phone and she told me she only wanted to give 2 units Novolog IV and then have the Rn recheck pt's CBG 30 minutes later after Insulin given and re-evaluate.  RN mader aware of MD's orders.  I asked Dr. Posey Pronto if I could place orders for Novolog based on how pt doses his Novolog at home and permission was given to me to enter the Novolog orders to match what patient doses at home.  Orders entered and reviewed with RN.     --Will follow patient during hospitalization--  Wyn Quaker RN, MSN, CDE Diabetes Coordinator Inpatient Glycemic Control Team Team Pager: [phone] (8a-5p)

## 2019-10-21 ENCOUNTER — Encounter: Payer: Self-pay | Admitting: Internal Medicine

## 2019-10-21 DIAGNOSIS — R030 Elevated blood-pressure reading, without diagnosis of hypertension: Secondary | ICD-10-CM

## 2019-10-21 DIAGNOSIS — E871 Hypo-osmolality and hyponatremia: Secondary | ICD-10-CM

## 2019-10-21 DIAGNOSIS — E1069 Type 1 diabetes mellitus with other specified complication: Secondary | ICD-10-CM

## 2019-10-21 DIAGNOSIS — G8220 Paraplegia, unspecified: Secondary | ICD-10-CM

## 2019-10-21 LAB — BASIC METABOLIC PANEL
Anion gap: 10 (ref 5–15)
BUN: 5 mg/dL — ABNORMAL LOW (ref 6–20)
CO2: 24 mmol/L (ref 22–32)
Calcium: 8.2 mg/dL — ABNORMAL LOW (ref 8.9–10.3)
Chloride: 96 mmol/L — ABNORMAL LOW (ref 98–111)
Creatinine, Ser: 0.55 mg/dL — ABNORMAL LOW (ref 0.61–1.24)
GFR calc Af Amer: >60 mL/min (ref >60)
GFR calc non Af Amer: >60 mL/min (ref >60)
Glucose, Bld: 145 mg/dL — ABNORMAL HIGH (ref 70–99)
Potassium: 3.4 mmol/L — ABNORMAL LOW (ref 3.5–5.1)
Sodium: 130 mmol/L — ABNORMAL LOW (ref 135–145)

## 2019-10-21 LAB — GLUCOSE, CAPILLARY
Glucose-Capillary: 146 mg/dL — ABNORMAL HIGH (ref 70–99)
Glucose-Capillary: 259 mg/dL — ABNORMAL HIGH (ref 70–99)
Glucose-Capillary: 168 mg/dL — ABNORMAL HIGH (ref 70–99)

## 2019-10-21 MED ORDER — POTASSIUM CHLORIDE CRYS ER 20 MEQ PO TBCR
40.0000 meq | EXTENDED_RELEASE_TABLET | Freq: Once | ORAL | Status: AC
  Administered 2019-10-21: 40 meq via ORAL
  Filled 2019-10-21: qty 2

## 2019-10-21 NOTE — Progress Notes (Signed)
Patient lost IV access. He states that he would rather not be re stuck at this time. MD notified. MD acknowledged.

## 2019-10-21 NOTE — Progress Notes (Addendum)
  Subjective: 2 Days Post-Op Procedure(s) (LRB): SHOULDER ARTHROSCOPY WITH DEBRIDEMENT, DECOMPRESSION, AND MASSIVE ROTATOR CUFF REPAIR. (Left) Patient reports pain as well-controlled.   Patient is well, but has had some minor complaints of tightness in the stomach. He states he has not had a bowel movement in several days.  Plan is to go Skilled nursing facility after hospital stay. Negative for chest pain and shortness of breath Fever: no Gastrointestinal: negative for nausea and vomiting.  Patient has not had a bowel movement.  Objective: Vital signs in last 24 hours: Temp:  [98.3 F (36.8 C)] 98.3 F (36.8 C) (11/14 0100) Pulse Rate:  [72-82] 82 (11/14 0100) Resp:  [17-18] 18 (11/14 0100) BP: (149-181)/(72-108) 156/108 (11/14 0440) SpO2:  [95 %-97 %] 95 % (11/14 0100)  Intake/Output from previous day:  Intake/Output Summary (Last 24 hours) at 10/21/2019 0922 Last data filed at 10/21/2019 0400 Gross per 24 hour  Intake 841.11 ml  Output 2950 ml  Net -2108.89 ml    Intake/Output this shift: No intake/output data recorded.  Labs: Recent Labs    10/19/19 1022  HGB 12.9*   Recent Labs    10/19/19 1022  HCT 38.0*   Recent Labs    10/20/19 0725 10/21/19 0621  NA 127* 130*  K 4.2 3.4*  CL 93* 96*  CO2 23 24  BUN 8 5*  CREATININE 0.66 0.55*  GLUCOSE 300* 145*  CALCIUM 8.4* 8.2*   No results for input(s): LABPT, INR in the last 72 hours.   EXAM General - Patient is Alert, Appropriate and Oriented Extremity - Adduction sling remains in place.  Sensation slightly decreased over the ulnar distribution, but more decreased over the median distribution of the fingers.  Patient unable to actively extend the fingers.  Patient able to actively extend the wrist.  Patient unable to make the thumbs up sign or crisscross the second third digits, but able to make the okay sign.  Radial pulse 2+. Dressing/Incision -Postoperative dressing remains in place. Motor Function  -patient able to actively extend the wrist.  Unable to extend the fingers.  Able to make the okay sign, but unable to make a thumbs up sign or crisscross the second and third digits. Cardiovascular- Regular rate and rhythm, no murmurs/rubs/gallops Respiratory- Lungs clear to auscultation bilaterally Gastrointestinal- slightly tender to palpation   Assessment/Plan: 2 Days Post-Op Procedure(s) (LRB): SHOULDER ARTHROSCOPY WITH DEBRIDEMENT, DECOMPRESSION, AND MASSIVE ROTATOR CUFF REPAIR. (Left) Active Problems:   Type 1 diabetes mellitus (HCC)   Anemia   Paraplegia (HCC)   Hyponatremia   Rotator cuff tear   Pressure injury of skin   Elevated blood pressure reading without diagnosis of hypertension   Hypomagnesemia  Estimated body mass index is 23.27 kg/m as calculated from the following:   Height as of this encounter: 6' 1"  (1.854 m).   Weight as of this encounter: 80 kg. Advance diet Plan for discharge tomorrow if insurance authorization obtained.  Continue physical therapy as per protocol. BP and DM will continue to be managed by internal medicine.   No AROM of left shoulder.  DVT Prophylaxis - Lovenox Non-weightbearing  to left arm  Cassell Smiles, PA-C [facility] Orthopaedic Surgery 10/21/2019, 9:22 AM

## 2019-10-21 NOTE — Progress Notes (Addendum)
Progress Note    [name]  ION:[id number] DOB: 1971/03/04  DOA: 10/19/2019 PCP: Einar Pheasant, MD        Assessment/Plan:   Active Problems:   Type 1 diabetes mellitus ([place])   Anemia   Paraplegia (HCC)   Hyponatremia   Rotator cuff tear   Pressure injury of skin   Elevated blood pressure reading without diagnosis of hypertension   Hypomagnesemia   Body mass index is 23.27 kg/m.    Type 1 diabetes mellitus: Continue Levemir and NovoLog as needed.  Elevated blood pressure: No antihypertensives for now.  Patient said his BP only runs low so we will avoid antihypertensives and monitor blood pressure closely.  Hyponatremia: This is chronic and he is asymptomatic.  Discontinue IV fluids.  Mild hypokalemia: Replete potassium  Rotator cuff tear: Status post surgical repair.  Follow-up with orthopedic surgeon.  Left heel deep tissue injury and unstageable coccygeal decubitus ulcer  which were present on admission: Continue local wound care  Paraplegia: Supportive care    Family Communication/Anticipated D/C date and plan/Code Status   DVT prophylaxis: Lovenox Code Status: Full code Family Communication: Plan discussed with the patient Disposition Plan: Disposition to be determined by orthopedic surgeon      Subjective:   C/o constipation and low back pain.  He says normally when he is constipated he has low back pain.  He also thinks his blood pressure is elevated today because of pain and constipation.  He said his blood pressure usually runs low within 1 baseline and sometimes he has to take Florinef to keep his blood pressure.  Objective:    Vitals:   10/20/19 2319 10/21/19 0100 10/21/19 0440 10/21/19 1110  BP: (!) 181/103 (!) 178/102 (!) 156/108 (!) 163/90  Pulse: 79 82  93  Resp: 17 18  18   Temp: 98.3 F (36.8 C) 98.3 F (36.8 C)  97.9 F (36.6 C)  TempSrc:    Oral  SpO2: 97% 95%  99%  Weight:      Height:        Intake/Output  Summary (Last 24 hours) at 10/21/2019 1456 Last data filed at 10/21/2019 1335 Gross per 24 hour  Intake 777.76 ml  Output 4250 ml  Net -3472.24 ml   Filed Weights   10/20/19 0020  Weight: 80 kg    Exam:  GEN: NAD SKIN: Left heel deep tissue injury and unstageable coccygeal decubitus ulcer.  which were present on admission EYES: No pallor or icterus ENT: MMM CV: RRR PULM: CTA B ABD: soft, ND, NT, +BS, + continuous glucose monitor noted in the left lower quadrant CNS: AAO x 3, paraplegia EXT: Dressing on the left shoulder.  Sling on left.  Prominent right biceps noted   Data Reviewed:   I have personally reviewed following labs and imaging studies:  Labs: Labs show the following:   Basic Metabolic Panel: Recent Labs  Lab 10/19/19 1022 10/20/19 0725 10/21/19 0621  NA 130* 127* 130*  K 3.7 4.2 3.4*  CL 94* 93* 96*  CO2  --  23 24  GLUCOSE 247* 300* 145*  BUN 4* 8 5*  CREATININE 0.50* 0.66 0.55*  CALCIUM  --  8.4* 8.2*   GFR Estimated Creatinine Clearance: 126.2 mL/min (A) (by C-G formula based on SCr of 0.55 mg/dL (L)). Liver Function Tests: No results for input(s): AST, ALT, ALKPHOS, BILITOT, PROT, ALBUMIN in the last 168 hours. No results for input(s): LIPASE, AMYLASE in the last 168  hours. No results for input(s): AMMONIA in the last 168 hours. Coagulation profile No results for input(s): INR, PROTIME in the last 168 hours.  CBC: Recent Labs  Lab 10/19/19 1022  HGB 12.9*  HCT 38.0*   Cardiac Enzymes: No results for input(s): CKTOTAL, CKMB, CKMBINDEX, TROPONINI in the last 168 hours. BNP (last 3 results) No results for input(s): PROBNP in the last 8760 hours. CBG: Recent Labs  Lab 10/20/19 1159 10/20/19 1514 10/20/19 1707 10/20/19 2245 10/21/19 0806  GLUCAP 304* 310* 260* 167* 146*   D-Dimer: No results for input(s): DDIMER in the last 72 hours. Hgb A1c: Recent Labs    10/19/19 1708  HGBA1C 6.2*   Lipid Profile: No results for  input(s): CHOL, HDL, LDLCALC, TRIG, CHOLHDL, LDLDIRECT in the last 72 hours. Thyroid function studies: No results for input(s): TSH, T4TOTAL, T3FREE, THYROIDAB in the last 72 hours.  Invalid input(s): FREET3 Anemia work up: No results for input(s): VITAMINB12, FOLATE, FERRITIN, TIBC, IRON, RETICCTPCT in the last 72 hours. Sepsis Labs: No results for input(s): PROCALCITON, WBC, LATICACIDVEN in the last 168 hours.  Microbiology Recent Results (from the past 240 hour(s))  SARS CORONAVIRUS 2 (TAT 6-24 HRS) Nasopharyngeal Nasopharyngeal Swab     Status: None   Collection Time: 10/16/19  2:16 PM   Specimen: Nasopharyngeal Swab  Result Value Ref Range Status   SARS Coronavirus 2 NEGATIVE NEGATIVE Final    Comment: (NOTE) SARS-CoV-2 target nucleic acids are NOT DETECTED. The SARS-CoV-2 RNA is generally detectable in upper and lower respiratory specimens during the acute phase of infection. Negative results do not preclude SARS-CoV-2 infection, do not rule out co-infections with other pathogens, and should not be used as the sole basis for treatment or other patient management decisions. Negative results must be combined with clinical observations, patient history, and epidemiological information. The expected result is Negative. Fact Sheet for Patients: SugarRoll.be Fact Sheet for Healthcare Providers: https://www.woods-mathews.com/ This test is not yet approved or cleared by the [place] FDA and  has been authorized for detection and/or diagnosis of SARS-CoV-2 by FDA under an Emergency Use Authorization (EUA). This EUA will remain  in effect (meaning this test can be used) for the duration of the COVID-19 declaration under Section 56 4(b)(1) of the Act, 21 U.S.C. section 360bbb-3(b)(1), unless the authorization is terminated or revoked sooner. Performed at [facility], [place] [address], [place], [place] [address]     Procedures  and diagnostic studies:  No results found.  Medications:   . baclofen  40 mg Oral TID  . docusate sodium  100 mg Oral BID  . enoxaparin (LOVENOX) injection  40 mg Subcutaneous Q24H  . insulin aspart  1-24 Units Subcutaneous TID WC  . insulin aspart  1-6 Units Subcutaneous TID WC  . insulin detemir  11 Units Subcutaneous QHS  . methenamine  1,000 mg Oral BID  . pantoprazole  40 mg Oral Daily  . phenylephrine  1 suppository Rectal BID  . sucralfate  1 g Oral TID AC  . vitamin C  1,000 mg Oral BID   Continuous Infusions: . sodium chloride Stopped (10/21/19 0916)     LOS: 1 day   [name]  [facility] Pager [phone].   *Please refer to [email or web address], password [email or web address] to get updated schedule on who will round on this patient, as hospitalists switch teams weekly. If 7PM-7AM, please contact night-coverage at [email or web address], password [email or web address] for any overnight needs.  10/21/2019, 2:56 PM

## 2019-10-21 NOTE — Progress Notes (Signed)
pts bp 163/90. MD made aware. No orders received.

## 2019-10-22 LAB — GLUCOSE, CAPILLARY
Glucose-Capillary: 134 mg/dL — ABNORMAL HIGH (ref 70–99)
Glucose-Capillary: 159 mg/dL — ABNORMAL HIGH (ref 70–99)
Glucose-Capillary: 192 mg/dL — ABNORMAL HIGH (ref 70–99)
Glucose-Capillary: 64 mg/dL — ABNORMAL LOW (ref 70–99)
Glucose-Capillary: 121 mg/dL — ABNORMAL HIGH (ref 70–99)

## 2019-10-22 LAB — BASIC METABOLIC PANEL
Anion gap: 7 (ref 5–15)
BUN: 8 mg/dL (ref 6–20)
CO2: 26 mmol/L (ref 22–32)
Calcium: 8.6 mg/dL — ABNORMAL LOW (ref 8.9–10.3)
Chloride: 98 mmol/L (ref 98–111)
Creatinine, Ser: 0.51 mg/dL — ABNORMAL LOW (ref 0.61–1.24)
GFR calc Af Amer: >60 mL/min (ref >60)
GFR calc non Af Amer: >60 mL/min (ref >60)
Glucose, Bld: 142 mg/dL — ABNORMAL HIGH (ref 70–99)
Potassium: 4.1 mmol/L (ref 3.5–5.1)
Sodium: 131 mmol/L — ABNORMAL LOW (ref 135–145)

## 2019-10-22 NOTE — Progress Notes (Signed)
Pt with frequent requests, uncooperative at times. Multiple Bowel movements. Pain well controled

## 2019-10-22 NOTE — Progress Notes (Signed)
  Subjective: 3 Days Post-Op Procedure(s) (LRB): SHOULDER ARTHROSCOPY WITH DEBRIDEMENT, DECOMPRESSION, AND MASSIVE ROTATOR CUFF REPAIR. (Left) Patient reports pain as well-controlled.   Patient is well, but has had some minor complaints of abdominal discomfort. Patient has frequently requested digitcal stimulation from nursing.  Plan is to go Skilled nursing facility after hospital stay. Negative for chest pain and shortness of breath Fever: no Gastrointestinal: negative for nausea and vomiting.  Patient has had a bowel movement.  Objective: Vital signs in last 24 hours: Temp:  [97.5 F (36.4 C)-98 F (36.7 C)] 98 F (36.7 C) (11/15 1033) Pulse Rate:  [63-74] 63 (11/15 1033) Resp:  [16-20] 20 (11/15 1033) BP: (112-187)/(60-110) 112/79 (11/15 1033) SpO2:  [97 %-99 %] 97 % (11/15 1033)  Intake/Output from previous day:  Intake/Output Summary (Last 24 hours) at 10/22/2019 1147 Last data filed at 10/22/2019 0607 Gross per 24 hour  Intake 240 ml  Output 3025 ml  Net -2785 ml    Intake/Output this shift: No intake/output data recorded.  Labs: No results for input(s): HGB in the last 72 hours. No results for input(s): WBC, RBC, HCT, PLT in the last 72 hours. Recent Labs    10/21/19 0621 10/22/19 0450  NA 130* 131*  K 3.4* 4.1  CL 96* 98  CO2 24 26  BUN 5* 8  CREATININE 0.55* 0.51*  GLUCOSE 145* 142*  CALCIUM 8.2* 8.6*   No results for input(s): LABPT, INR in the last 72 hours.   EXAM General - Patient is Alert, Appropriate and Oriented Extremity -patient remains in adduction sling.  Patient is able to flex and extend the fingers of the left hand.  Patient able to flex and extend the wrist of the left hand.  Patient is able to make the okay sign, thumbs up sign, and crisscross the second and third digits.  Reports continued paresthesias over the volar aspect of the thumb. Dressing/Incision -postoperative dressing remains in place.  Minimal drainage noted after dressing  removal.  No erythema, drainage, or signs of infection. Motor Function -unable to move lower extremities due to history of paraplegia. Cardiovascular- Regular rate and rhythm, no murmurs/rubs/gallops Respiratory- Lungs clear to auscultation bilaterally Gastrointestinal- soft, nontender and active bowel sounds   Assessment/Plan: 3 Days Post-Op Procedure(s) (LRB): SHOULDER ARTHROSCOPY WITH DEBRIDEMENT, DECOMPRESSION, AND MASSIVE ROTATOR CUFF REPAIR. (Left) Active Problems:   Type 1 diabetes mellitus (HCC)   Anemia   Paraplegia (HCC)   Hyponatremia   Rotator cuff tear   Pressure injury of skin   Elevated blood pressure reading without diagnosis of hypertension   Hypomagnesemia  Estimated body mass index is 23.27 kg/m as calculated from the following:   Height as of this encounter: 6' 1"  (1.854 m).   Weight as of this encounter: 80 kg. Advance diet Up with therapy Discharge to SNF pending insurance approval.  Digital stimulation was performed after patient's repeated request to nursing.  No stool or gas was passed.  Postoperative dressing was changed and replaced with honeycomb dressings.    DVT Prophylaxis - Lovenox Nonweightbearing to left arm.  Cassell Smiles, PA-C [facility] 10/22/2019, 11:47 AM

## 2019-10-22 NOTE — Progress Notes (Addendum)
Progress Note    [name]  TDH:[id number] DOB: 09-22-1971  DOA: 10/19/2019 PCP: Einar Pheasant, MD        Assessment/Plan:   Active Problems:   Type 1 diabetes mellitus ([place])   Anemia   Paraplegia (HCC)   Hyponatremia   Rotator cuff tear   Pressure injury of skin   Elevated blood pressure reading without diagnosis of hypertension   Hypomagnesemia   Body mass index is 23.27 kg/m.    Type 1 diabetes mellitus: Glucose levels are okay.  Continue Levemir and NovoLog  Elevated blood pressure: Improved.  Hyponatremia: This is chronic and he is asymptomatic.  Mild hypokalemia: Resolved  Rotator cuff tear: Status post surgical repair.  Follow-up with orthopedic surgeon.  Left heel deep tissue injury and unstageablecoccygealdecubitusulcer present on admission which were present on admission: Continue local wound care  Constipation: Improved.  Patient requested digital stimulation from nursing staff note this was performed by Tamala Julian, PA with orthopedics team and there was no stool or gas according to him.  Paraplegia: Supportive care  I will sign off for now. Please call with questions.  Plan of care was discussed with Tamala Julian, PA, who was also at the bedside.    Family Communication/Anticipated D/C date and plan/Code Status   DVT prophylaxis: Lovenox Code Status: Full code Family Communication: Plan discussed with the patient Disposition Plan: Disposition to be determined by orthopedic surgeon      Subjective:   He complains of pressure in his rectum.  He said he was able to move his bowels and according to nursing staff he has about 4 bowel movements after he was given laxative.  Otherwise, he feels well.  Objective:    Vitals:   10/21/19 1953 10/22/19 0534 10/22/19 0623 10/22/19 1033  BP: 133/87 (!) 187/110 122/60 112/79  Pulse: 74 74  63  Resp: 16   20  Temp: (!) 97.5 F (36.4 C) 97.7 F (36.5 C)  98 F (36.7 C)  TempSrc:  Oral Oral  Oral  SpO2: 99% 98%  97%  Weight:      Height:        Intake/Output Summary (Last 24 hours) at 10/22/2019 1401 Last data filed at 10/22/2019 6468 Gross per 24 hour  Intake -  Output 1425 ml  Net -1425 ml   Filed Weights   10/20/19 0020  Weight: 80 kg    Exam:   GEN: NAD SKIN: left heel deep tissue injury, unstageable coccygeal decubitus ulcer present on admission. EYES: No pallor or icterus ENT: MMM CV: RRR PULM: CTA B ABD: soft, ND, NT, +BS CNS: AAO x 3, paraplegic EXT: Surgical incisions with staples on the left shoulder look clean, dry and intact.  Sling on left arm.    Data Reviewed:   I have personally reviewed following labs and imaging studies:  Labs: Labs show the following:   Basic Metabolic Panel: Recent Labs  Lab 10/19/19 1022 10/20/19 0725 10/21/19 0621 10/22/19 0450  NA 130* 127* 130* 131*  K 3.7 4.2 3.4* 4.1  CL 94* 93* 96* 98  CO2  --  23 24 26   GLUCOSE 247* 300* 145* 142*  BUN 4* 8 5* 8  CREATININE 0.50* 0.66 0.55* 0.51*  CALCIUM  --  8.4* 8.2* 8.6*   GFR Estimated Creatinine Clearance: 126.2 mL/min (A) (by C-G formula based on SCr of 0.51 mg/dL (L)). Liver Function Tests: No results for input(s): AST, ALT, ALKPHOS, BILITOT, PROT, ALBUMIN in  the last 168 hours. No results for input(s): LIPASE, AMYLASE in the last 168 hours. No results for input(s): AMMONIA in the last 168 hours. Coagulation profile No results for input(s): INR, PROTIME in the last 168 hours.  CBC: Recent Labs  Lab 10/19/19 1022  HGB 12.9*  HCT 38.0*   Cardiac Enzymes: No results for input(s): CKTOTAL, CKMB, CKMBINDEX, TROPONINI in the last 168 hours. BNP (last 3 results) No results for input(s): PROBNP in the last 8760 hours. CBG: Recent Labs  Lab 10/21/19 0806 10/21/19 1709 10/21/19 2159 10/22/19 0822 10/22/19 1225  GLUCAP 146* 259* 168* 134* 159*   D-Dimer: No results for input(s): DDIMER in the last 72 hours. Hgb A1c: Recent Labs     10/19/19 1708  HGBA1C 6.2*   Lipid Profile: No results for input(s): CHOL, HDL, LDLCALC, TRIG, CHOLHDL, LDLDIRECT in the last 72 hours. Thyroid function studies: No results for input(s): TSH, T4TOTAL, T3FREE, THYROIDAB in the last 72 hours.  Invalid input(s): FREET3 Anemia work up: No results for input(s): VITAMINB12, FOLATE, FERRITIN, TIBC, IRON, RETICCTPCT in the last 72 hours. Sepsis Labs: No results for input(s): PROCALCITON, WBC, LATICACIDVEN in the last 168 hours.  Microbiology Recent Results (from the past 240 hour(s))  SARS CORONAVIRUS 2 (TAT 6-24 HRS) Nasopharyngeal Nasopharyngeal Swab     Status: None   Collection Time: 10/16/19  2:16 PM   Specimen: Nasopharyngeal Swab  Result Value Ref Range Status   SARS Coronavirus 2 NEGATIVE NEGATIVE Final    Comment: (NOTE) SARS-CoV-2 target nucleic acids are NOT DETECTED. The SARS-CoV-2 RNA is generally detectable in upper and lower respiratory specimens during the acute phase of infection. Negative results do not preclude SARS-CoV-2 infection, do not rule out co-infections with other pathogens, and should not be used as the sole basis for treatment or other patient management decisions. Negative results must be combined with clinical observations, patient history, and epidemiological information. The expected result is Negative. Fact Sheet for Patients: SugarRoll.be Fact Sheet for Healthcare Providers: https://www.woods-mathews.com/ This test is not yet approved or cleared by the [place] FDA and  has been authorized for detection and/or diagnosis of SARS-CoV-2 by FDA under an Emergency Use Authorization (EUA). This EUA will remain  in effect (meaning this test can be used) for the duration of the COVID-19 declaration under Section 56 4(b)(1) of the Act, 21 U.S.C. section 360bbb-3(b)(1), unless the authorization is terminated or revoked sooner. Performed at [facility], [place] [address] [place], [place] [address]     Procedures and diagnostic studies:  No results found.  Medications:   . baclofen  40 mg Oral TID  . docusate sodium  100 mg Oral BID  . enoxaparin (LOVENOX) injection  40 mg Subcutaneous Q24H  . insulin aspart  1-24 Units Subcutaneous TID WC  . insulin aspart  1-6 Units Subcutaneous TID WC  . insulin detemir  11 Units Subcutaneous QHS  . methenamine  1,000 mg Oral BID  . pantoprazole  40 mg Oral Daily  . phenylephrine  1 suppository Rectal BID  . sucralfate  1 g Oral TID AC  . vitamin C  1,000 mg Oral BID   Continuous Infusions:    LOS: 2 days   Mattix Imhof  [facility] Pager [phone].   *Please refer to [email or web address], password [email or web address] to get updated schedule on who will round on this patient, as hospitalists switch teams weekly. If 7PM-7AM, please contact night-coverage at [email or web address], password [email or web address] for any overnight needs.  10/22/2019, 2:01 PM

## 2019-10-22 NOTE — Progress Notes (Signed)
Pt eating snack and drinking orange juice to reverse hypoglycemia.

## 2019-10-22 NOTE — Plan of Care (Signed)
Patient medicated once with prn oxy ir.  Patient reports more sensation in left fingers today to PA.  Call bell in reach.  Will continue to monitor.

## 2019-10-23 LAB — URINALYSIS, ROUTINE W REFLEX MICROSCOPIC
Bilirubin Urine: NEGATIVE
Glucose, UA: >=500 mg/dL — AB
Hgb urine dipstick: NEGATIVE
Ketones, ur: 5 mg/dL — AB
Nitrite: NEGATIVE
Protein, ur: NEGATIVE mg/dL
Specific Gravity, Urine: 1.005 (ref 1.005–1.030)
Squamous Epithelial / HPF: NONE SEEN (ref 0–5)
pH: 7 (ref 5.0–8.0)

## 2019-10-23 LAB — SARS CORONAVIRUS 2 (TAT 6-24 HRS): SARS Coronavirus 2: NEGATIVE

## 2019-10-23 LAB — GLUCOSE, CAPILLARY
Glucose-Capillary: 148 mg/dL — ABNORMAL HIGH (ref 70–99)
Glucose-Capillary: 210 mg/dL — ABNORMAL HIGH (ref 70–99)
Glucose-Capillary: 276 mg/dL — ABNORMAL HIGH (ref 70–99)
Glucose-Capillary: 211 mg/dL — ABNORMAL HIGH (ref 70–99)

## 2019-10-23 MED ORDER — INFLUENZA VAC SPLIT QUAD 0.5 ML IM SUSY
0.5000 mL | PREFILLED_SYRINGE | INTRAMUSCULAR | Status: AC
  Administered 2019-10-24: 0.5 mL via INTRAMUSCULAR
  Filled 2019-10-23: qty 0.5

## 2019-10-23 MED ORDER — CIPROFLOXACIN HCL 500 MG PO TABS: 500.0000 mg | ORAL_TABLET | Freq: Two times a day (BID) | ORAL | 0 refills | Status: DC

## 2019-10-23 NOTE — Progress Notes (Signed)
Subjective: 4 Days Post-Op Procedure(s) (LRB): SHOULDER ARTHROSCOPY WITH DEBRIDEMENT, DECOMPRESSION, AND MASSIVE ROTATOR CUFF REPAIR. (Left) Patient reports pain as 2 on 0-10 scale.  Patient also frustrated by his inability to sleep well.  Otherwise no new complaints.  Objective: Vital signs in last 24 hours: Temp:  [98 F (36.7 C)-98.7 F (37.1 C)] 98.7 F (37.1 C) (11/15 2300) Pulse Rate:  [63-78] 72 (11/15 2300) Resp:  [18-20] 18 (11/15 2300) BP: (99-178)/(64-106) 110/65 (11/15 2300) SpO2:  [96 %-99 %] 99 % (11/15 2300)  Intake/Output from previous day: 11/15 0701 - 11/16 0700 In: -  Out: 850 [Urine:850] Intake/Output this shift: No intake/output data recorded.  No results for input(s): HGB in the last 72 hours. No results for input(s): WBC, RBC, HCT, PLT in the last 72 hours. Recent Labs    10/21/19 0621 10/22/19 0450  NA 130* 131*  K 3.4* 4.1  CL 96* 98  CO2 24 26  BUN 5* 8  CREATININE 0.55* 0.51*  GLUCOSE 145* 142*  CALCIUM 8.2* 8.6*   No results for input(s): LABPT, INR in the last 72 hours.  Left shoulder exam: ABD soft Intact pulses distally Incision: dressing C/D/I and no drainage No cellulitis present Neurologically unchanged versus preop, other than some residual numbness to left thumb following block.  Assessment/Plan: 4 Days Post-Op Procedure(s) (LRB): SHOULDER ARTHROSCOPY WITH DEBRIDEMENT, DECOMPRESSION, AND MASSIVE ROTATOR CUFF REPAIR. (Left) Up with therapy using either Hoyer lift or sliding board. Continue discharge planning.  Patient requires skilled nursing facility due to his paraplegia and left shoulder surgery.   Marshall Cork Teala Daffron 10/23/2019, 7:53 AM

## 2019-10-23 NOTE — Progress Notes (Signed)
Physical Therapy Treatment Patient Details Name: [name] MRN: [id number] DOB: [birth year] Today's Date: 10/23/2019    History of Present Illness [name] is a 48 y.o. male who presents after Left rotator cuff repair. Pt sustained recent trauma to left shoulder with multiple full thickness tears. 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    PT Comments    Pt resting flat in bed upon PT arrival; pt's mom present during session.  Pt agreeable to getting to the chair.  Upon pt sitting more upright in bed (bringing HOB elevated), pt reporting dizziness and feeling like he was going to pass out (BP 79/50 with HR 70 bpm) so pt assisted to laying flat again with BP 110/74 (and HR 71 bpm).  Attempted to gradually bring pt's HOB more elevated (BP initially 97/65 with elevation and HR 70 bpm) but pt reporting same symptoms of dizziness and needing to lay flat again (to prevent passing out); symptoms resolved with laying flat.  Therapist performed PROM and stretching B LE's and with attempt at increasing [place] pt's BP 152/93 (with HR 92 bpm).  Sling donned and hoyer lift initiated (bringing pt into upright positioning via hoyer sling but pt's bottom and LE's still on the bed) and pt reporting similar symptoms and eventually pt reporting need to lay back down again.  Pt's BP 101/69 (HR initially 102 bpm but decreased to 70 bpm with rest) once laying down and symptoms resolved.  Pt reports h/o autonomic dysreflexia (can be daily); also that his BP will be lower after eating a big meal or if needing to have bowel movement.  3/10 L shoulder pain and 4/10 LBP during session.  Pt reporting L thumb still numb since surgery (therapist loosed L thumb strap a little and pt reporting a little improvement in sensation in L thumb later in session).  Nurse notified regarding pt's BP and symptoms during session.  Will continue to focus on LE ROM/stretching  and improving tolerance to upright in order to progress towards transfer to chair.   Follow Up Recommendations  SNF     Equipment Recommendations  (single arm w/c; hoyer lift)    Recommendations for Other Services       Precautions / Restrictions Precautions Precautions: Shoulder;Fall Shoulder Interventions: Shoulder sling/immobilizer;At all times;Off for dressing/bathing/exercises Precaution Booklet Issued: No Restrictions Weight Bearing Restrictions: Yes LUE Weight Bearing: Non weight bearing    Mobility  Bed Mobility Overal bed mobility: Needs Assistance Bed Mobility: Rolling           General bed mobility comments: bed flat; logrolling R with max assist (assist to initiate and continue logroll) x2 trials and slight logroll towards L (to place and remove hoyer pad sling)  Transfers                 General transfer comment: initiated hoyer lift up but deferred transfer d/t pt reports of symptoms  Ambulation/Gait                 Stairs             Wheelchair Mobility    Modified Rankin (Stroke Patients Only)       Balance Overall balance assessment: Needs assistance Sitting-balance support: Single extremity supported   Sitting balance - Comments: pt able to pull self up into long-sitting with R UE on bed rail with close SBA (HOB already elevated)  Cognition Arousal/Alertness: Awake/alert Behavior During Therapy: WFL for tasks assessed/performed Overall Cognitive Status: Within Functional Limits for tasks assessed                                        Exercises Total Joint Exercises Heel Slides: PROM;Both;10 reps;Supine Hip ABduction/ADduction: PROM;Both;10 reps;Supine Other Exercises Other Exercises: 3x30 seconds B heelcord stretching semi-supine in bed    General Comments General comments (skin integrity, edema, etc.): small blister noted L heel (pt reports h/o  this and requested therapist check it to make sure it had not opened)--blister still intact.  Nursing cleared pt for participation in physical therapy.  Pt agreeable to PT session.      Pertinent Vitals/Pain Pain Assessment: 0-10 Pain Score: 4 (4/10 LBP; 3/10 L shoulder pain) Pain Location: LBP and L shoulder pain Pain Descriptors / Indicators: Sore Pain Intervention(s): Limited activity within patient's tolerance;Monitored during session;Repositioned    Home Living                      Prior Function            PT Goals (current goals can now be found in the care plan section) Acute Rehab PT Goals Patient Stated Goal: go to rehab and get stronger PT Goal Formulation: With patient Progress towards PT goals: Progressing toward goals    Frequency    Min 2X/week      PT Plan Current plan remains appropriate    Co-evaluation              AM-PAC PT "6 Clicks" Mobility   Outcome Measure  Help needed turning from your back to your side while in a flat bed without using bedrails?: A Lot Help needed moving from lying on your back to sitting on the side of a flat bed without using bedrails?: Total Help needed moving to and from a bed to a chair (including a wheelchair)?: Total Help needed standing up from a chair using your arms (e.g., wheelchair or bedside chair)?: Total Help needed to walk in hospital room?: Total Help needed climbing 3-5 steps with a railing? : Total 6 Click Score: 7    End of Session   Activity Tolerance: Treatment limited secondary to medical complications (Comment)(Limited d/t BP concerns and symptoms) Patient left: in bed;with call bell/phone within reach;with bed alarm set;Other (comment)(B LE PRAFO's in place; L UE sling in place) Nurse Communication: Mobility status;Precautions;Other (comment)(pt's BP and symptoms during session) PT Visit Diagnosis: Other abnormalities of gait and mobility (R26.89)     Time: 6754-4920 PT Time  Calculation (min) (ACUTE ONLY): 72 min  Charges:  $Therapeutic Exercise: 8-22 mins $Therapeutic Activity: 53-67 mins                     Leitha Bleak, PT 10/23/19, 12:17 PM

## 2019-10-23 NOTE — Progress Notes (Addendum)
D: Pt alert and oriented. Pain has been well maintained w/PRN medications.  A: Scheduled medications administered to pt, per MD orders. Support and encouragement provided. Frequent verbal contact made.   R: No adverse drug reactions noted. Pt complaint with medications and treatment plan. Pt interacts well with staff on the unit. Pt is stable at this time, will continue to monitor and provide care as ordered.  Pt experienced variation in BP while working with PT. Physician was notified, no orders placed, continue to monitor BP. PT included BP variation w/in their note.

## 2019-10-23 NOTE — Progress Notes (Signed)
Physical Therapy Treatment Patient Details Name: [name] MRN: [id number] DOB: 1971/05/07 Today's Date: 10/23/2019    History of Present Illness [name] is a 48 y.o. male who presents after Left rotator cuff repair. Pt sustained recent trauma to left shoulder with multiple full thickness tears. 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    PT Comments    Pt resting in bed upon PT arrival and agreeable to getting OOB to chair.  Therapist performed L LE ROM ex's and then pt noted to be appearing uncomfortable and sweating; pt reporting h/o L knee issues and L hip issues from L knee surgery so L LE repositioned in bed to attempt to improve symptoms but pt reporting needing to sit up so therapist assisted pt into long-sitting.  Symptoms improved and pt agreeable to placing hoyer pad sling; during placement of hoyer pad sling, mild smear of bowel noted on pt's linen.  After pt set up in hoyer pad sling (pt laying flat in bed), pt appearing uncomfortable and sweating; pt reporting needing to reposition and have sling removed (sling removed and pt assisted to his R side).  Pt reporting feeling like he needed to have a bowel movement (pt reports this was potential source of his current autonomic dysreflexia symptoms); nurse notified of pt's autonomic dysreflexia symptoms and pt request for digital stim and nurse came to assist pt with bowel movement (d/t extended process, therapy session ended).  Will continue to focus on ROM and progressive tolerance to mobility.    Follow Up Recommendations  SNF     Equipment Recommendations  (single arm w/c; hoyer lift)    Recommendations for Other Services       Precautions / Restrictions Precautions Precautions: Shoulder;Fall Shoulder Interventions: Shoulder sling/immobilizer;At all times;Off for dressing/bathing/exercises Precaution Booklet Issued: No Restrictions Weight Bearing  Restrictions: Yes LUE Weight Bearing: Non weight bearing    Mobility  Bed Mobility Overal bed mobility: Needs Assistance Bed Mobility: Rolling           General bed mobility comments: bed flat; logrolling R with max assist (assist to initiate and continue logroll) x2 trials and slight logroll towards L (to place and remove hoyer pad sling)  Transfers                 General transfer comment: Deferred d/t pt symptomatic (sweating) and requesting repositioning and removal of hoyer pad  Ambulation/Gait                 Stairs             Wheelchair Mobility    Modified Rankin (Stroke Patients Only)       Balance Overall balance assessment: Needs assistance Sitting-balance support: Single extremity supported   Sitting balance - Comments: pt able to pull self up into long-sitting with R UE on bed rail with close SBA (HOB already elevated)                                    Cognition Arousal/Alertness: Awake/alert Behavior During Therapy: WFL for tasks assessed/performed Overall Cognitive Status: Within Functional Limits for tasks assessed  Exercises Total Joint Exercises Heel Slides: PROM;Left;10 reps Hip ABduction/ADduction: PROM;Left;10 reps    General Comments   Nursing cleared pt for participation in physical therapy.  Pt agreeable to PT session.      Pertinent Vitals/Pain Pain Assessment: 0-10 Pain Location: LBP and L shoulder pain Pain Descriptors / Indicators: Sore Pain Intervention(s): Limited activity within patient's tolerance;Monitored during session;Repositioned    Home Living                      Prior Function            PT Goals (current goals can now be found in the care plan section) Acute Rehab PT Goals Patient Stated Goal: go to rehab and get stronger PT Goal Formulation: With patient Progress towards PT goals: Progressing toward goals     Frequency    Min 2X/week      PT Plan Current plan remains appropriate    Co-evaluation              AM-PAC PT "6 Clicks" Mobility   Outcome Measure  Help needed turning from your back to your side while in a flat bed without using bedrails?: A Lot Help needed moving from lying on your back to sitting on the side of a flat bed without using bedrails?: Total Help needed moving to and from a bed to a chair (including a wheelchair)?: Total Help needed standing up from a chair using your arms (e.g., wheelchair or bedside chair)?: Total Help needed to walk in hospital room?: Total Help needed climbing 3-5 steps with a railing? : Total 6 Click Score: 7    End of Session   Activity Tolerance: Treatment limited secondary to medical complications (Comment)(pt sweating and feeling like he needed to have bowel movement) Patient left: in bed;with nursing/sitter in room(nurse present performing digital stim for bowel movement) Nurse Communication: Mobility status;Precautions;Other (comment)(pt's symptoms) PT Visit Diagnosis: Other abnormalities of gait and mobility (R26.89)     Time: 1519-1600 PT Time Calculation (min) (ACUTE ONLY): 41 min  Charges:  $Therapeutic Exercise: 8-22 mins $Therapeutic Activity: 8-22 mins                     Leitha Bleak, PT 10/23/19, 4:47 PM [phone]

## 2019-10-23 NOTE — TOC Progression Note (Signed)
Transition of Care [facility]) - Progression Note    Patient Details  Name: [name] MRN: [id number] Date of Birth: [birth year]  Transition [facility] Care [facility]) CM/SW Contact  Su Hilt, RN Phone Number: 10/23/2019, 9:18 AM  Clinical Narrative:    [facility] to check status of auth ref number [id number] Auth number [id number] Next review 11/17  [employer] [facility] care coordinator Barriers to Discharge: Continued Medical Work up  Expected Discharge Plan and Services     Discharge Planning Services: CM Consult Post Acute Care Choice: [place] arrangements for the past 2 months: Single Family Home                                       Social Determinants of Health (SDOH) Interventions    Readmission Risk Interventions No flowsheet data found.

## 2019-10-23 NOTE — TOC Progression Note (Signed)
Transition of Care [facility]) - Progression Note    Patient Details  Name: [name] MRN: [id number] Date of Birth: 28-Nov-1971  Transition of Care [facility]) CM/SW Contact  Su Hilt, RN Phone Number: 10/23/2019, 9:24 AM  Clinical Narrative:    Ronni Rumble at [facility] and provided Auth information, She requested a new covid test since the last one was 7 days ago, I notified Dr Roland Rack     Barriers to Discharge: Continued Medical Work up  Expected Discharge Plan and Services     Discharge Planning Services: CM Consult Post Acute Care Choice: [place] arrangements for the past 2 months: Single Family Home                                       Social Determinants of Health (SDOH) Interventions    Readmission Risk Interventions No flowsheet data found.

## 2019-10-24 DIAGNOSIS — R279 Unspecified lack of coordination: Secondary | ICD-10-CM | POA: Diagnosis not present

## 2019-10-24 DIAGNOSIS — R5381 Other malaise: Secondary | ICD-10-CM | POA: Diagnosis not present

## 2019-10-24 DIAGNOSIS — M6281 Muscle weakness (generalized): Secondary | ICD-10-CM | POA: Diagnosis not present

## 2019-10-24 DIAGNOSIS — Z4789 Encounter for other orthopedic aftercare: Secondary | ICD-10-CM | POA: Diagnosis not present

## 2019-10-24 DIAGNOSIS — Z743 Need for continuous supervision: Secondary | ICD-10-CM | POA: Diagnosis not present

## 2019-10-24 DIAGNOSIS — N390 Urinary tract infection, site not specified: Secondary | ICD-10-CM | POA: Diagnosis not present

## 2019-10-24 DIAGNOSIS — K219 Gastro-esophageal reflux disease without esophagitis: Secondary | ICD-10-CM | POA: Diagnosis not present

## 2019-10-24 DIAGNOSIS — I959 Hypotension, unspecified: Secondary | ICD-10-CM | POA: Diagnosis not present

## 2019-10-24 DIAGNOSIS — E119 Type 2 diabetes mellitus without complications: Secondary | ICD-10-CM | POA: Diagnosis not present

## 2019-10-24 DIAGNOSIS — M2550 Pain in unspecified joint: Secondary | ICD-10-CM | POA: Diagnosis not present

## 2019-10-24 DIAGNOSIS — Z7401 Bed confinement status: Secondary | ICD-10-CM | POA: Diagnosis not present

## 2019-10-24 DIAGNOSIS — M62838 Other muscle spasm: Secondary | ICD-10-CM | POA: Diagnosis not present

## 2019-10-24 DIAGNOSIS — E569 Vitamin deficiency, unspecified: Secondary | ICD-10-CM | POA: Diagnosis not present

## 2019-10-24 DIAGNOSIS — Z23 Encounter for immunization: Secondary | ICD-10-CM | POA: Diagnosis not present

## 2019-10-24 LAB — GLUCOSE, CAPILLARY
Glucose-Capillary: 185 mg/dL — ABNORMAL HIGH (ref 70–99)
Glucose-Capillary: 287 mg/dL — ABNORMAL HIGH (ref 70–99)
Glucose-Capillary: 214 mg/dL — ABNORMAL HIGH (ref 70–99)

## 2019-10-24 MED ORDER — GENTAMICIN SULFATE 40 MG/ML IJ SOLN
200.0000 mg | Freq: Once | INTRAMUSCULAR | Status: AC
  Administered 2019-10-24: 200 mg via INTRAMUSCULAR
  Filled 2019-10-24 (×2): qty 5

## 2019-10-24 MED ORDER — CIPROFLOXACIN HCL 500 MG PO TABS
500.0000 mg | ORAL_TABLET | Freq: Two times a day (BID) | ORAL | Status: DC
  Filled 2019-10-24: qty 1

## 2019-10-24 MED ORDER — GENTAMICIN SULFATE 40 MG/ML IJ SOLN
7.0000 mg/kg | Freq: Once | INTRAVENOUS | Status: DC
  Filled 2019-10-24: qty 14

## 2019-10-24 MED ORDER — SODIUM CHLORIDE 1 G PO TABS
1.0000 g | ORAL_TABLET | Freq: Three times a day (TID) | ORAL | Status: DC | PRN
  Filled 2019-10-24: qty 1

## 2019-10-24 MED ORDER — SODIUM CHLORIDE 1 G PO TABS: 1.0000 g | ORAL_TABLET | Freq: Three times a day (TID) | ORAL | 2 refills | Status: DC | PRN

## 2019-10-24 MED ORDER — CIPROFLOXACIN HCL 500 MG PO TABS
750.0000 mg | ORAL_TABLET | Freq: Two times a day (BID) | ORAL | Status: DC
  Filled 2019-10-24: qty 1.5

## 2019-10-24 MED ORDER — CIPROFLOXACIN HCL 500 MG PO TABS
750.0000 mg | ORAL_TABLET | Freq: Once | ORAL | Status: AC
  Administered 2019-10-24: 750 mg via ORAL
  Filled 2019-10-24: qty 1.5

## 2019-10-24 NOTE — TOC Progression Note (Signed)
Transition of Care [facility]) - Progression Note    Patient Details  Name: [name] MRN: [id number] Date of Birth: [birth year]  Transition of Care [facility]) CM/SW Contact  Su Hilt, RN Phone Number: 10/24/2019, 8:50 AM  Clinical Narrative:     Hulen Skains the [facility] in [place], They stated that I could fax a referral form in to them and they would look at it and see if the patient meets criteria, the fax number to send clinical notes and demographic sheet is [phone]    Barriers to Discharge: Continued Medical Work up  Expected Discharge Plan and Services     Discharge Planning Services: CM Consult Post Acute Care Choice: [place] arrangements for the past 2 months: Single Family Home Expected Discharge Date: 10/23/19                                     Social Determinants of Health (SDOH) Interventions    Readmission Risk Interventions No flowsheet data found.

## 2019-10-24 NOTE — Progress Notes (Signed)
  Subjective: 5 Days Post-Op Procedure(s) (LRB): SHOULDER ARTHROSCOPY WITH DEBRIDEMENT, DECOMPRESSION, AND MASSIVE ROTATOR CUFF REPAIR. (Left) Patient reports pain as well-controlled.   Patient is having problems with increased urianry output. He states he is "dehydrated from peeing so much last night." Plan is to go Rehab after hospital stay. Negative for chest pain and shortness of breath Fever: no Gastrointestinal: negative for nausea and vomiting.  Patient has had a bowel movement.  Objective: Vital signs in last 24 hours: Temp:  [98.4 F (36.9 C)-98.7 F (37.1 C)] 98.4 F (36.9 C) (11/17 0003) Pulse Rate:  [80-96] 96 (11/17 0003) Resp:  [17-19] 19 (11/17 0003) BP: (114-160)/(80-116) 114/80 (11/17 0003) SpO2:  [98 %-100 %] 98 % (11/17 0003)  Intake/Output from previous day:  Intake/Output Summary (Last 24 hours) at 10/24/2019 0737 Last data filed at 10/24/2019 0602 Gross per 24 hour  Intake -  Output 2450 ml  Net -2450 ml    Intake/Output this shift: No intake/output data recorded.  Labs: No results for input(s): HGB in the last 72 hours. No results for input(s): WBC, RBC, HCT, PLT in the last 72 hours. Recent Labs    10/22/19 0450  NA 131*  K 4.1  CL 98  CO2 26  BUN 8  CREATININE 0.51*  GLUCOSE 142*  CALCIUM 8.6*   No results for input(s): LABPT, INR in the last 72 hours.   EXAM General - Patient is Alert, Appropriate and Oriented Extremity - Neurovascular intact abduction pillow in place. Sensation intact over the median, radial, and ulnar nerve distributions. Dressing/Incision -clean, dry, no drainage. Honeycomb dressings in place.  Motor Function -able to make thumbs up sign, okay sign, and crisscross the second and third digits.  Able to actively flex and extend the fingers and wrist. Cardiovascular- Regular rate and rhythm, no murmurs/rubs/gallops Respiratory- Lungs clear to auscultation bilaterally Gastrointestinal- soft and  nontender   Assessment/Plan: 5 Days Post-Op Procedure(s) (LRB): SHOULDER ARTHROSCOPY WITH DEBRIDEMENT, DECOMPRESSION, AND MASSIVE ROTATOR CUFF REPAIR. (Left) Active Problems:   Type 1 diabetes mellitus (HCC)   Anemia   Paraplegia (HCC)   Hyponatremia   Rotator cuff tear   Pressure injury of skin   Elevated blood pressure reading without diagnosis of hypertension   Hypomagnesemia  Estimated body mass index is 23.27 kg/m as calculated from the following:   Height as of this encounter: 6' 1"  (1.854 m).   Weight as of this encounter: 80 kg. Advance diet Up with therapy Discharge to SNF  Cipro 750 mg loading dose followed by 500 mg q12h for UTI.    DVT Prophylaxis - Lovenox Nonweightbearing to left upper extremity.  Cassell Smiles, PA-C [facility] Orthopaedic Surgery 10/24/2019, 7:37 AM

## 2019-10-24 NOTE — TOC Progression Note (Signed)
Transition of Care [facility]) - Progression Note    Patient Details  Name: [name] MRN: [id number] Date of Birth: 27-Nov-1971  Transition of Care [facility]) CM/SW [place], RN Phone Number: 10/24/2019, 10:26 AM  Clinical Narrative:    The [facility] called and stated that the patient does not meet their criteria and that they do not function as a SNF or work with folks for other injuries other than SCI I notified the patient and he agreed and said that he did not think he would but it was worth a shot     Barriers to Discharge: Barriers Resolved  Expected Discharge Plan and Services     Discharge Planning Services: CM Consult Post Acute Care Choice: [place] arrangements for the past 2 months: Single Family Home Expected Discharge Date: 10/23/19                                     Social Determinants of Health (SDOH) Interventions    Readmission Risk Interventions No flowsheet data found.

## 2019-10-24 NOTE — TOC Progression Note (Signed)
Transition of Care [facility]) - Progression Note    Patient Details  Name: [name] MRN: [id number] Date of Birth: [birth year]  Transition of Care [facility] CM/SW Contact  Su Hilt, RN Phone Number: 10/24/2019, 10:01 AM  Clinical Narrative:     Spoke to the patient and he asked if I would check into [facility] center for his rehab.  I explained that He has a discharge order but I would be happy to check. He stated that he would go to [facility] today and then he could go to [place] from [facility] if they accepted him I agreed to send the referral request to [facility] for them to look at and see if he meets their criteria    Barriers to Discharge: Continued Medical Work up  Expected Discharge Plan and Services     Discharge Planning Services: CM Consult Post Acute Care Choice: [place] arrangements for the past 2 months: Single Family Home Expected Discharge Date: 10/23/19                                     Social Determinants of Health (SDOH) Interventions    Readmission Risk Interventions No flowsheet data found.

## 2019-10-24 NOTE — Consult Note (Addendum)
Pharmacy Antibiotic Note  [name] is a 48 y.o. male admitted on 10/19/2019 with UTI.  Pharmacy has been consulted for Gentamicin dosing.  After discussion with the physician, patient to receive 1 time dose of medication, and follow with Ciprofloxacin PO outpatient upon discharge.  Plan: Patient refusing IV administration of medication, therefore will adjust order to Gentamicin 460m IM x 1 to be given in as 2 separate 2029mdoses(26m59mer dose).  Height: 6' 1"  (185.4 cm) Weight: 176 lb 5.9 oz (80 kg) IBW/kg (Calculated) : 79.9  Temp (24hrs), Avg:98.5 F (36.9 C), Min:98.3 F (36.8 C), Max:98.7 F (37.1 C)  Recent Labs  Lab 10/19/19 1022 10/20/19 0725 10/21/19 0621 10/22/19 0450  CREATININE 0.50* 0.66 0.55* 0.51*    Estimated Creatinine Clearance: 126.2 mL/min (A) (by C-G formula based on SCr of 0.51 mg/dL (L)).    Allergies  Allergen Reactions  . Decongestant [Pseudoephedrine Hcl] Other (See Comments)    Cause UTIs  . Ivp Dye [Iodinated Diagnostic Agents] Hives and Other (See Comments)    Urticaria   . Sulfa Antibiotics Swelling and Other (See Comments)    Tongue swells    Antimicrobials this admission: Gentamicin 11/17 x 1 Ciprofloxacin 11/17>> Cefazolin 11/12 >> 11/13 Methenamine 11/12 >>    Thank you for allowing pharmacy to be a part of this patient's care.  Jabri Blancett A Kennady Zimmerle 10/24/2019 7:56 AM

## 2019-10-24 NOTE — Progress Notes (Signed)
Inpatient Diabetes Program Recommendations  AACE/ADA: New Consensus Statement on Inpatient Glycemic Control   Target Ranges:  Prepandial:   less than 140 mg/dL      [facility] postprandial:   less than 180 mg/dL (1-2 hours)      Critically ill patients:  140 - 180 mg/dL   Results for [name], [name] (MRN [id number]) as of 10/24/2019 09:21  Ref. Range 10/23/2019 08:00 10/23/2019 12:16 10/23/2019 16:46 10/23/2019 22:44 10/24/2019 07:36  Glucose-Capillary Latest Ref Range: 70 - 99 mg/dL 148 (H) 210 (H) 276 (H) 211 (H) 287 (H)   Review of Glycemic Control  Diabetes history: DM1 (makes NO insulin; requires basal, correction, and carbohydrate coverage insulin) Outpatient Diabetes medications: Levemir 11 units QHS, Humalog 1-50 units TID with meals Current orders for Inpatient glycemic control: Levemir 11 units QHS, Novolog 1-6 units TID with meals (for correction), Novolog 1-24 units TID with meals (for meal coverage)  NOTE: In reviewing chart, noted patient received Fludrocortisone on 10/23/19 at 9:23 am which is likely contributing to noted hyperglycemia. No other steroids are ordered at this time.  Do not recommend any changes at this time. Will continue to follow along.  Thanks, Barnie Alderman, RN, MSN, CDE Diabetes Coordinator Inpatient Diabetes Program [phone] (Team Pager from 8am to 5pm)

## 2019-10-24 NOTE — Progress Notes (Signed)
Per pt, he wants 10 units of insulin for breakfast coverage.

## 2019-10-24 NOTE — TOC Transition Note (Addendum)
Transition of Care [facility]) - CM/SW Discharge Note   Patient Details  Name: ISAMI MEHRA MRN: [id number] Date of Birth: 10-18-1971  Transition of Care [facility]) CM/SW Contact:  Su Hilt, RN Phone Number: 10/24/2019, 10:14 AM   Clinical Narrative:    Spoke to the patient and he will be going to [facility] today via EMS room 706 The bedside nurse will call report to [phone] She will also call EMS to transport  I called his mother Hoyle Sauer and provided her with the information  I called Otila Kluver at [facility] and let her know I faxed the referal to [facility] in [place] and asked if she will follow up  Final next level of care: [place] Barriers to Discharge: Continued Medical Work up   Patient Goals and CMS Choice Patient states their goals for this hospitalization and ongoing recovery are:: go to rehab CMS Medicare.gov Compare Post Acute Care list provided to:: Patient Choice offered to / list presented to : Patient  Discharge Placement PASRR number recieved: 10/20/19 Existing PASRR number confirmed : 10/20/19          Patient chooses bed at: [facility] Patient to be transferred to facility by: EMS      Discharge Plan and Services   Discharge Planning Services: CM Consult Post Acute Care Choice: [place]                               Social Determinants of Health (SDOH) Interventions     Readmission Risk Interventions No flowsheet data found.

## 2019-10-24 NOTE — Progress Notes (Signed)
Called report to Kendall @ [facility]. Answered all questions. Called EMS for transport

## 2019-10-24 NOTE — TOC Progression Note (Cosign Needed)
Transition of Care [facility]) - Progression Note    Patient Details  Name: [name] MRN: [id number] Date of Birth: [birth year]  Transition of Care [facility]) CM/SW [place], RN Phone Number: 10/24/2019, 12:00 PM  Clinical Narrative:     Called Dr Poggi's office to inquire about the H&P for this surgery The only H&P in the chart if for the Right rotator cuff surgery done in July.  She will have the surgery scheduler [place] call me    Barriers to Discharge: Barriers Resolved  Expected Discharge Plan and Services     Discharge Planning Services: CM Consult Post Acute Care Choice: [place] arrangements for the past 2 months: Single Family Home Expected Discharge Date: 10/23/19                                     Social Determinants of Health (SDOH) Interventions    Readmission Risk Interventions No flowsheet data found.

## 2019-10-25 ENCOUNTER — Telehealth: Payer: Self-pay

## 2019-10-25 ENCOUNTER — Telehealth: Payer: Self-pay | Admitting: Internal Medicine

## 2019-10-25 DIAGNOSIS — G8220 Paraplegia, unspecified: Secondary | ICD-10-CM

## 2019-10-25 DIAGNOSIS — Z9889 Other specified postprocedural states: Secondary | ICD-10-CM

## 2019-10-25 NOTE — Telephone Encounter (Signed)
Spoke to [name].  They are getting him the equipment he needs and contacting facility MD for evaluation and orders.  Will let me know if I need to do anything more.

## 2019-10-25 NOTE — Telephone Encounter (Signed)
Patient father (DPR) sent this message via My chart but in his record and not sons so I have pasted below fathers concerns.  [facility] today for rehab. He has been told by Dr. Leslye Peer that he will need to stay in rehab for 3 months.  He said there was no rails on the bed and he couldn't adjust himself. They are not taking care of his diabetes and his blood pressure keeps dropping and he feels like he is going to pass out.  He talked to his nurse and she said that they were geared more to the elderly.  He's very upset that they don't know how to take care of people that are paralyzed.  We are at our wits end.  Can you tell us any place that you know of that specializes in cases like [name]? We need help and don't know where to start looking.   Noralee Chars  I called and spoke with Elmyra Ricks at [facility] [phone], Elmyra Ricks advised bed rails are being placed and I suggested overhead trapeze for comfort adjusting, so patient can periodically relieve pressure from buttocks. Elmyra Ricks advised CBG's have been with in range since admission I ave ask for a copy of his CBG readings and BP reading since admission to [facility] she advised they would be faxed over today. Spoke to patient father and advised of the informtion received from [place] at [facility] and advised I would call back with update after receiving CBG's and BP readings, I have also pended DME order for Over head trapeze for use with good arm, Patient mother and father states the patient does ROM to bilateral legs daily in the am and PM to reduce stiffness and to prevent spasm and stiffness.  I have pended both order if PCP in agreement to plan.

## 2019-10-25 NOTE — Telephone Encounter (Signed)
Called notified patient DPR of PCP advice, as listed below they will call PA for [facility], and ask for update on patient status and for ROM for lower extremities if they cannot get these from Dr. Roland Rack office. Did verify patient has bed rails to be able to adjust self in bed.

## 2019-10-25 NOTE — Telephone Encounter (Signed)
Copied from [place] [id number]. Topic: Quick Communication - See Telephone Encounter >> Oct 25, 2019  3:03 PM Loma [place] wrote: CRM for notification. See Telephone encounter for: 10/25/19. Morley Kos , [place] wants Dr Nicki Reaper to call him re this in common pt. He is with [facility]. His Cell is [phone]. Just whenever you have time.

## 2019-10-25 NOTE — Telephone Encounter (Signed)
No they are contacting in facility PA for BP and CBG's, and Dr. Roland Rack for PT/OT therapy for shoulder the ROM for legs will have to come from [facility] facility MD.

## 2019-10-25 NOTE — Telephone Encounter (Signed)
Noted.  See where they are going to get PT orders from Dr Roland Rack.  Are they going to f/u regarding his sugars and blood pressure as well.  Just want to make sure MD notified and assessed pt.  Let me know if I need to do anything more.

## 2019-10-25 NOTE — Telephone Encounter (Signed)
Reviewed. Pt in skilled nursing.  There is a physician for the facility that will be rounding on him and giving orders.  Please make sure they make facility MD aware of current concerns.  Also, Dr Roland Rack did his shoulder surgery.  Need to get PT orders ok'd by him - to confirm what he is allowed to do.  Regarding his sugar, he is normally on insulin.  Need to make sure he is getting his insulin and I assume he has sliding scale coverage.  Will need to adjust insulin based on sugar readings.  He has issues with fluctuations in his blood pressure.  Will need to be closely monitored.  Again, need to get MD that will be rounding on him to assess him and give orders.  Let me know if I need to do anything more.

## 2019-10-26 ENCOUNTER — Emergency Department: Payer: Medicare Other

## 2019-10-26 ENCOUNTER — Inpatient Hospital Stay: Payer: Medicare Other

## 2019-10-26 ENCOUNTER — Inpatient Hospital Stay: Admit: 2019-10-26 | Payer: Medicare Other

## 2019-10-26 ENCOUNTER — Other Ambulatory Visit: Payer: Self-pay

## 2019-10-26 ENCOUNTER — Inpatient Hospital Stay
Admission: EM | Admit: 2019-10-26 | Discharge: 2019-11-01 | DRG: 637 | Disposition: A | Discharge status: Skilled Nursing Facility | Payer: Medicare Other | Source: Skilled Nursing Facility | Attending: Internal Medicine | Admitting: Internal Medicine

## 2019-10-26 ENCOUNTER — Telehealth: Payer: Self-pay

## 2019-10-26 DIAGNOSIS — M1990 Unspecified osteoarthritis, unspecified site: Secondary | ICD-10-CM | POA: Diagnosis present

## 2019-10-26 DIAGNOSIS — E569 Vitamin deficiency, unspecified: Secondary | ICD-10-CM | POA: Diagnosis not present

## 2019-10-26 DIAGNOSIS — E1165 Type 2 diabetes mellitus with hyperglycemia: Secondary | ICD-10-CM | POA: Diagnosis not present

## 2019-10-26 DIAGNOSIS — R29710 NIHSS score 10: Secondary | ICD-10-CM | POA: Diagnosis present

## 2019-10-26 DIAGNOSIS — Z993 Dependence on wheelchair: Secondary | ICD-10-CM

## 2019-10-26 DIAGNOSIS — F329 Major depressive disorder, single episode, unspecified: Secondary | ICD-10-CM | POA: Diagnosis present

## 2019-10-26 DIAGNOSIS — I361 Nonrheumatic tricuspid (valve) insufficiency: Secondary | ICD-10-CM | POA: Diagnosis not present

## 2019-10-26 DIAGNOSIS — Z8349 Family history of other endocrine, nutritional and metabolic diseases: Secondary | ICD-10-CM

## 2019-10-26 DIAGNOSIS — M75100 Unspecified rotator cuff tear or rupture of unspecified shoulder, not specified as traumatic: Secondary | ICD-10-CM

## 2019-10-26 DIAGNOSIS — I639 Cerebral infarction, unspecified: Secondary | ICD-10-CM | POA: Diagnosis not present

## 2019-10-26 DIAGNOSIS — I10 Essential (primary) hypertension: Secondary | ICD-10-CM | POA: Diagnosis not present

## 2019-10-26 DIAGNOSIS — I63422 Cerebral infarction due to embolism of left anterior cerebral artery: Secondary | ICD-10-CM | POA: Diagnosis present

## 2019-10-26 DIAGNOSIS — G9341 Metabolic encephalopathy: Secondary | ICD-10-CM | POA: Diagnosis present

## 2019-10-26 DIAGNOSIS — M6281 Muscle weakness (generalized): Secondary | ICD-10-CM | POA: Diagnosis not present

## 2019-10-26 DIAGNOSIS — Z79899 Other long term (current) drug therapy: Secondary | ICD-10-CM

## 2019-10-26 DIAGNOSIS — R9401 Abnormal electroencephalogram [EEG]: Secondary | ICD-10-CM | POA: Diagnosis not present

## 2019-10-26 DIAGNOSIS — Z4789 Encounter for other orthopedic aftercare: Secondary | ICD-10-CM | POA: Diagnosis not present

## 2019-10-26 DIAGNOSIS — R32 Unspecified urinary incontinence: Secondary | ICD-10-CM | POA: Diagnosis present

## 2019-10-26 DIAGNOSIS — Z743 Need for continuous supervision: Secondary | ICD-10-CM | POA: Diagnosis not present

## 2019-10-26 DIAGNOSIS — Z888 Allergy status to other drugs, medicaments and biological substances status: Secondary | ICD-10-CM | POA: Diagnosis not present

## 2019-10-26 DIAGNOSIS — E1069 Type 1 diabetes mellitus with other specified complication: Secondary | ICD-10-CM

## 2019-10-26 DIAGNOSIS — K219 Gastro-esophageal reflux disease without esophagitis: Secondary | ICD-10-CM | POA: Diagnosis not present

## 2019-10-26 DIAGNOSIS — D62 Acute posthemorrhagic anemia: Secondary | ICD-10-CM | POA: Diagnosis not present

## 2019-10-26 DIAGNOSIS — M65342 Trigger finger, left ring finger: Secondary | ICD-10-CM | POA: Diagnosis not present

## 2019-10-26 DIAGNOSIS — E871 Hypo-osmolality and hyponatremia: Secondary | ICD-10-CM | POA: Diagnosis not present

## 2019-10-26 DIAGNOSIS — I959 Hypotension, unspecified: Secondary | ICD-10-CM | POA: Diagnosis not present

## 2019-10-26 DIAGNOSIS — Z7401 Bed confinement status: Secondary | ICD-10-CM | POA: Diagnosis not present

## 2019-10-26 DIAGNOSIS — I6389 Other cerebral infarction: Secondary | ICD-10-CM | POA: Diagnosis not present

## 2019-10-26 DIAGNOSIS — E1010 Type 1 diabetes mellitus with ketoacidosis without coma: Secondary | ICD-10-CM | POA: Diagnosis not present

## 2019-10-26 DIAGNOSIS — G8929 Other chronic pain: Secondary | ICD-10-CM | POA: Diagnosis present

## 2019-10-26 DIAGNOSIS — Z91041 Radiographic dye allergy status: Secondary | ICD-10-CM | POA: Diagnosis not present

## 2019-10-26 DIAGNOSIS — G9340 Encephalopathy, unspecified: Secondary | ICD-10-CM | POA: Diagnosis not present

## 2019-10-26 DIAGNOSIS — R0689 Other abnormalities of breathing: Secondary | ICD-10-CM | POA: Diagnosis not present

## 2019-10-26 DIAGNOSIS — Z981 Arthrodesis status: Secondary | ICD-10-CM | POA: Diagnosis not present

## 2019-10-26 DIAGNOSIS — I6310 Cerebral infarction due to embolism of unspecified precerebral artery: Secondary | ICD-10-CM | POA: Diagnosis not present

## 2019-10-26 DIAGNOSIS — I63233 Cerebral infarction due to unspecified occlusion or stenosis of bilateral carotid arteries: Secondary | ICD-10-CM | POA: Diagnosis not present

## 2019-10-26 DIAGNOSIS — Z882 Allergy status to sulfonamides status: Secondary | ICD-10-CM | POA: Diagnosis not present

## 2019-10-26 DIAGNOSIS — G9389 Other specified disorders of brain: Secondary | ICD-10-CM | POA: Diagnosis not present

## 2019-10-26 DIAGNOSIS — Z720 Tobacco use: Secondary | ICD-10-CM

## 2019-10-26 DIAGNOSIS — I951 Orthostatic hypotension: Secondary | ICD-10-CM | POA: Diagnosis not present

## 2019-10-26 DIAGNOSIS — Z87442 Personal history of urinary calculi: Secondary | ICD-10-CM

## 2019-10-26 DIAGNOSIS — M75122 Complete rotator cuff tear or rupture of left shoulder, not specified as traumatic: Secondary | ICD-10-CM | POA: Diagnosis not present

## 2019-10-26 DIAGNOSIS — R410 Disorientation, unspecified: Secondary | ICD-10-CM | POA: Diagnosis not present

## 2019-10-26 DIAGNOSIS — Z794 Long term (current) use of insulin: Secondary | ICD-10-CM | POA: Diagnosis not present

## 2019-10-26 DIAGNOSIS — G8254 Quadriplegia, C5-C7 incomplete: Secondary | ICD-10-CM | POA: Diagnosis not present

## 2019-10-26 DIAGNOSIS — E0810 Diabetes mellitus due to underlying condition with ketoacidosis without coma: Secondary | ICD-10-CM | POA: Diagnosis not present

## 2019-10-26 DIAGNOSIS — G904 Autonomic dysreflexia: Secondary | ICD-10-CM | POA: Diagnosis not present

## 2019-10-26 DIAGNOSIS — N179 Acute kidney failure, unspecified: Secondary | ICD-10-CM | POA: Diagnosis not present

## 2019-10-26 DIAGNOSIS — G8250 Quadriplegia, unspecified: Secondary | ICD-10-CM | POA: Diagnosis not present

## 2019-10-26 DIAGNOSIS — M62838 Other muscle spasm: Secondary | ICD-10-CM | POA: Diagnosis present

## 2019-10-26 DIAGNOSIS — Z87440 Personal history of urinary (tract) infections: Secondary | ICD-10-CM | POA: Diagnosis not present

## 2019-10-26 DIAGNOSIS — L89102 Pressure ulcer of unspecified part of back, stage 2: Secondary | ICD-10-CM | POA: Diagnosis present

## 2019-10-26 DIAGNOSIS — D649 Anemia, unspecified: Secondary | ICD-10-CM | POA: Diagnosis present

## 2019-10-26 DIAGNOSIS — I248 Other forms of acute ischemic heart disease: Secondary | ICD-10-CM | POA: Diagnosis present

## 2019-10-26 DIAGNOSIS — M2550 Pain in unspecified joint: Secondary | ICD-10-CM | POA: Diagnosis not present

## 2019-10-26 DIAGNOSIS — N390 Urinary tract infection, site not specified: Secondary | ICD-10-CM | POA: Diagnosis not present

## 2019-10-26 DIAGNOSIS — R778 Other specified abnormalities of plasma proteins: Secondary | ICD-10-CM | POA: Diagnosis present

## 2019-10-26 DIAGNOSIS — R000 Tachycardia, unspecified: Secondary | ICD-10-CM | POA: Diagnosis not present

## 2019-10-26 DIAGNOSIS — N319 Neuromuscular dysfunction of bladder, unspecified: Secondary | ICD-10-CM | POA: Diagnosis present

## 2019-10-26 DIAGNOSIS — L89109 Pressure ulcer of unspecified part of back, unspecified stage: Secondary | ICD-10-CM

## 2019-10-26 DIAGNOSIS — M25512 Pain in left shoulder: Secondary | ICD-10-CM | POA: Diagnosis present

## 2019-10-26 DIAGNOSIS — E119 Type 2 diabetes mellitus without complications: Secondary | ICD-10-CM | POA: Diagnosis not present

## 2019-10-26 DIAGNOSIS — I340 Nonrheumatic mitral (valve) insufficiency: Secondary | ICD-10-CM | POA: Diagnosis not present

## 2019-10-26 DIAGNOSIS — Z66 Do not resuscitate: Secondary | ICD-10-CM | POA: Diagnosis not present

## 2019-10-26 DIAGNOSIS — S46012A Strain of muscle(s) and tendon(s) of the rotator cuff of left shoulder, initial encounter: Secondary | ICD-10-CM | POA: Diagnosis not present

## 2019-10-26 DIAGNOSIS — E1110 Type 2 diabetes mellitus with ketoacidosis without coma: Secondary | ICD-10-CM | POA: Diagnosis present

## 2019-10-26 DIAGNOSIS — R001 Bradycardia, unspecified: Secondary | ICD-10-CM | POA: Diagnosis present

## 2019-10-26 DIAGNOSIS — R279 Unspecified lack of coordination: Secondary | ICD-10-CM | POA: Diagnosis not present

## 2019-10-26 DIAGNOSIS — F419 Anxiety disorder, unspecified: Secondary | ICD-10-CM | POA: Diagnosis present

## 2019-10-26 DIAGNOSIS — Z03818 Encounter for observation for suspected exposure to other biological agents ruled out: Secondary | ICD-10-CM | POA: Diagnosis not present

## 2019-10-26 DIAGNOSIS — L89100 Pressure ulcer of unspecified part of back, unstageable: Secondary | ICD-10-CM | POA: Diagnosis not present

## 2019-10-26 DIAGNOSIS — Z8249 Family history of ischemic heart disease and other diseases of the circulatory system: Secondary | ICD-10-CM

## 2019-10-26 DIAGNOSIS — R293 Abnormal posture: Secondary | ICD-10-CM | POA: Diagnosis not present

## 2019-10-26 DIAGNOSIS — Z20828 Contact with and (suspected) exposure to other viral communicable diseases: Secondary | ICD-10-CM | POA: Diagnosis present

## 2019-10-26 DIAGNOSIS — M25511 Pain in right shoulder: Secondary | ICD-10-CM | POA: Diagnosis present

## 2019-10-26 DIAGNOSIS — R600 Localized edema: Secondary | ICD-10-CM | POA: Diagnosis not present

## 2019-10-26 DIAGNOSIS — I63443 Cerebral infarction due to embolism of bilateral cerebellar arteries: Secondary | ICD-10-CM | POA: Diagnosis not present

## 2019-10-26 LAB — BASIC METABOLIC PANEL
Anion gap: 25 — ABNORMAL HIGH (ref 5–15)
Anion gap: 20 — ABNORMAL HIGH (ref 5–15)
Anion gap: 13 (ref 5–15)
Anion gap: 11 (ref 5–15)
Anion gap: 9 (ref 5–15)
Anion gap: 9 (ref 5–15)
BUN: 31 mg/dL — ABNORMAL HIGH (ref 6–20)
BUN: 32 mg/dL — ABNORMAL HIGH (ref 6–20)
BUN: 30 mg/dL — ABNORMAL HIGH (ref 6–20)
BUN: 27 mg/dL — ABNORMAL HIGH (ref 6–20)
BUN: 23 mg/dL — ABNORMAL HIGH (ref 6–20)
BUN: 21 mg/dL — ABNORMAL HIGH (ref 6–20)
CO2: 11 mmol/L — ABNORMAL LOW (ref 22–32)
CO2: 12 mmol/L — ABNORMAL LOW (ref 22–32)
CO2: 19 mmol/L — ABNORMAL LOW (ref 22–32)
CO2: 20 mmol/L — ABNORMAL LOW (ref 22–32)
CO2: 21 mmol/L — ABNORMAL LOW (ref 22–32)
CO2: 21 mmol/L — ABNORMAL LOW (ref 22–32)
Calcium: 8.4 mg/dL — ABNORMAL LOW (ref 8.9–10.3)
Calcium: 7.7 mg/dL — ABNORMAL LOW (ref 8.9–10.3)
Calcium: 8.1 mg/dL — ABNORMAL LOW (ref 8.9–10.3)
Calcium: 8 mg/dL — ABNORMAL LOW (ref 8.9–10.3)
Calcium: 8.3 mg/dL — ABNORMAL LOW (ref 8.9–10.3)
Calcium: 8.2 mg/dL — ABNORMAL LOW (ref 8.9–10.3)
Chloride: 90 mmol/L — ABNORMAL LOW (ref 98–111)
Chloride: 97 mmol/L — ABNORMAL LOW (ref 98–111)
Chloride: 99 mmol/L (ref 98–111)
Chloride: 100 mmol/L (ref 98–111)
Chloride: 103 mmol/L (ref 98–111)
Chloride: 102 mmol/L (ref 98–111)
Creatinine, Ser: 1.56 mg/dL — ABNORMAL HIGH (ref 0.61–1.24)
Creatinine, Ser: 1.52 mg/dL — ABNORMAL HIGH (ref 0.61–1.24)
Creatinine, Ser: 1.14 mg/dL (ref 0.61–1.24)
Creatinine, Ser: 0.95 mg/dL (ref 0.61–1.24)
Creatinine, Ser: 0.83 mg/dL (ref 0.61–1.24)
Creatinine, Ser: 0.65 mg/dL (ref 0.61–1.24)
GFR calc Af Amer: 60 mL/min — ABNORMAL LOW (ref >60)
GFR calc Af Amer: >60 mL/min (ref >60)
GFR calc Af Amer: >60 mL/min (ref >60)
GFR calc Af Amer: >60 mL/min (ref >60)
GFR calc Af Amer: >60 mL/min (ref >60)
GFR calc Af Amer: >60 mL/min (ref >60)
GFR calc non Af Amer: 51 mL/min — ABNORMAL LOW (ref >60)
GFR calc non Af Amer: 53 mL/min — ABNORMAL LOW (ref >60)
GFR calc non Af Amer: >60 mL/min (ref >60)
GFR calc non Af Amer: >60 mL/min (ref >60)
GFR calc non Af Amer: >60 mL/min (ref >60)
GFR calc non Af Amer: >60 mL/min (ref >60)
Glucose, Bld: 536 mg/dL (ref 70–99)
Glucose, Bld: 359 mg/dL — ABNORMAL HIGH (ref 70–99)
Glucose, Bld: 225 mg/dL — ABNORMAL HIGH (ref 70–99)
Glucose, Bld: 240 mg/dL — ABNORMAL HIGH (ref 70–99)
Glucose, Bld: 201 mg/dL — ABNORMAL HIGH (ref 70–99)
Glucose, Bld: 167 mg/dL — ABNORMAL HIGH (ref 70–99)
Potassium: 5.2 mmol/L — ABNORMAL HIGH (ref 3.5–5.1)
Potassium: 3.7 mmol/L (ref 3.5–5.1)
Potassium: 3.5 mmol/L (ref 3.5–5.1)
Potassium: 3.7 mmol/L (ref 3.5–5.1)
Potassium: 3.4 mmol/L — ABNORMAL LOW (ref 3.5–5.1)
Potassium: 3.2 mmol/L — ABNORMAL LOW (ref 3.5–5.1)
Sodium: 126 mmol/L — ABNORMAL LOW (ref 135–145)
Sodium: 129 mmol/L — ABNORMAL LOW (ref 135–145)
Sodium: 131 mmol/L — ABNORMAL LOW (ref 135–145)
Sodium: 131 mmol/L — ABNORMAL LOW (ref 135–145)
Sodium: 133 mmol/L — ABNORMAL LOW (ref 135–145)
Sodium: 132 mmol/L — ABNORMAL LOW (ref 135–145)

## 2019-10-26 LAB — BLOOD GAS, VENOUS
Acid-base deficit: 18.6 mmol/L — ABNORMAL HIGH (ref 0.0–2.0)
Bicarbonate: 7.8 mmol/L — ABNORMAL LOW (ref 20.0–28.0)
O2 Saturation: 85.3 %
Patient temperature: 37
pCO2, Ven: 21 mmHg — ABNORMAL LOW (ref 44.0–60.0)
pH, Ven: 7.18 — CL (ref 7.250–7.430)
pO2, Ven: 64 mmHg — ABNORMAL HIGH (ref 32.0–45.0)

## 2019-10-26 LAB — HEPATIC FUNCTION PANEL
ALT: 20 U/L (ref 0–44)
AST: 29 U/L (ref 15–41)
Albumin: 3.7 g/dL (ref 3.5–5.0)
Alkaline Phosphatase: 87 U/L (ref 38–126)
Bilirubin, Direct: 0.1 mg/dL (ref 0.0–0.2)
Indirect Bilirubin: 1.3 mg/dL — ABNORMAL HIGH (ref 0.3–0.9)
Total Bilirubin: 1.4 mg/dL — ABNORMAL HIGH (ref 0.3–1.2)
Total Protein: 6.6 g/dL (ref 6.5–8.1)

## 2019-10-26 LAB — BLOOD GAS, ARTERIAL
Acid-base deficit: 6 mmol/L — ABNORMAL HIGH (ref 0.0–2.0)
Bicarbonate: 19.6 mmol/L — ABNORMAL LOW (ref 20.0–28.0)
FIO2: 0.21
O2 Saturation: 97.4 %
Patient temperature: 37
pCO2 arterial: 38 mmHg (ref 32.0–48.0)
pH, Arterial: 7.32 — ABNORMAL LOW (ref 7.350–7.450)
pO2, Arterial: 103 mmHg (ref 83.0–108.0)

## 2019-10-26 LAB — URINE DRUG SCREEN, QUALITATIVE (ARMC ONLY)
Amphetamines, Ur Screen: NOT DETECTED
Barbiturates, Ur Screen: NOT DETECTED
Benzodiazepine, Ur Scrn: NOT DETECTED
Cannabinoid 50 Ng, Ur ~~LOC~~: POSITIVE — AB
Cocaine Metabolite,Ur ~~LOC~~: NOT DETECTED
MDMA (Ecstasy)Ur Screen: NOT DETECTED
Methadone Scn, Ur: NOT DETECTED
Opiate, Ur Screen: NOT DETECTED
Phencyclidine (PCP) Ur S: NOT DETECTED
Tricyclic, Ur Screen: NOT DETECTED

## 2019-10-26 LAB — CBC
HCT: 31.8 % — ABNORMAL LOW (ref 39.0–52.0)
Hemoglobin: 10.5 g/dL — ABNORMAL LOW (ref 13.0–17.0)
MCH: 32.2 pg (ref 26.0–34.0)
MCHC: 33 g/dL (ref 30.0–36.0)
MCV: 97.5 fL (ref 80.0–100.0)
Platelets: 188 10*3/uL (ref 150–400)
RBC: 3.26 MIL/uL — ABNORMAL LOW (ref 4.22–5.81)
RDW: 12 % (ref 11.5–15.5)
WBC: 6.5 10*3/uL (ref 4.0–10.5)
nRBC: 0 % (ref 0.0–0.2)

## 2019-10-26 LAB — URINE CULTURE: Culture: <10000 — AB

## 2019-10-26 LAB — GLUCOSE, CAPILLARY
Glucose-Capillary: 116 mg/dL — ABNORMAL HIGH (ref 70–99)
Glucose-Capillary: 162 mg/dL — ABNORMAL HIGH (ref 70–99)
Glucose-Capillary: 162 mg/dL — ABNORMAL HIGH (ref 70–99)
Glucose-Capillary: 167 mg/dL — ABNORMAL HIGH (ref 70–99)
Glucose-Capillary: 178 mg/dL — ABNORMAL HIGH (ref 70–99)
Glucose-Capillary: 193 mg/dL — ABNORMAL HIGH (ref 70–99)
Glucose-Capillary: 208 mg/dL — ABNORMAL HIGH (ref 70–99)
Glucose-Capillary: 209 mg/dL — ABNORMAL HIGH (ref 70–99)
Glucose-Capillary: 210 mg/dL — ABNORMAL HIGH (ref 70–99)
Glucose-Capillary: 220 mg/dL — ABNORMAL HIGH (ref 70–99)
Glucose-Capillary: 230 mg/dL — ABNORMAL HIGH (ref 70–99)
Glucose-Capillary: 232 mg/dL — ABNORMAL HIGH (ref 70–99)
Glucose-Capillary: 240 mg/dL — ABNORMAL HIGH (ref 70–99)
Glucose-Capillary: 249 mg/dL — ABNORMAL HIGH (ref 70–99)
Glucose-Capillary: 300 mg/dL — ABNORMAL HIGH (ref 70–99)
Glucose-Capillary: 302 mg/dL — ABNORMAL HIGH (ref 70–99)
Glucose-Capillary: 345 mg/dL — ABNORMAL HIGH (ref 70–99)
Glucose-Capillary: 399 mg/dL — ABNORMAL HIGH (ref 70–99)
Glucose-Capillary: 503 mg/dL (ref 70–99)
Glucose-Capillary: 536 mg/dL (ref 70–99)
Glucose-Capillary: 98 mg/dL (ref 70–99)

## 2019-10-26 LAB — URINALYSIS, COMPLETE (UACMP) WITH MICROSCOPIC
Bacteria, UA: NONE SEEN
Bilirubin Urine: NEGATIVE
Glucose, UA: >=500 mg/dL — AB
Hgb urine dipstick: NEGATIVE
Ketones, ur: 80 mg/dL — AB
Nitrite: NEGATIVE
Protein, ur: NEGATIVE mg/dL
Specific Gravity, Urine: 1.017 (ref 1.005–1.030)
pH: 5 (ref 5.0–8.0)

## 2019-10-26 LAB — MRSA PCR SCREENING: MRSA by PCR: NEGATIVE

## 2019-10-26 LAB — PROCALCITONIN: Procalcitonin: <0.1 ng/mL

## 2019-10-26 LAB — TROPONIN I (HIGH SENSITIVITY)
Troponin I (High Sensitivity): 430 ng/L (ref <18)
Troponin I (High Sensitivity): 809 ng/L (ref <18)
Troponin I (High Sensitivity): 1005 ng/L (ref <18)

## 2019-10-26 LAB — HIV ANTIBODY (ROUTINE TESTING W REFLEX): HIV Screen 4th Generation wRfx: NONREACTIVE

## 2019-10-26 LAB — LACTIC ACID, PLASMA: Lactic Acid, Venous: 1.7 mmol/L (ref 0.5–1.9)

## 2019-10-26 LAB — PHOSPHORUS: Phosphorus: 5.1 mg/dL — ABNORMAL HIGH (ref 2.5–4.6)

## 2019-10-26 LAB — BETA-HYDROXYBUTYRIC ACID: Beta-Hydroxybutyric Acid: >8 mmol/L — ABNORMAL HIGH (ref 0.05–0.27)

## 2019-10-26 LAB — MAGNESIUM: Magnesium: 1.5 mg/dL — ABNORMAL LOW (ref 1.7–2.4)

## 2019-10-26 LAB — SARS CORONAVIRUS 2 (TAT 6-24 HRS): SARS Coronavirus 2: NEGATIVE

## 2019-10-26 LAB — AMMONIA: Ammonia: 16 umol/L (ref 9–35)

## 2019-10-26 MED ORDER — NAPHAZOLINE-GLYCERIN 0.012-0.2 % OP SOLN
1.0000 [drp] | Freq: Four times a day (QID) | OPHTHALMIC | Status: DC | PRN
  Filled 2019-10-26: qty 15

## 2019-10-26 MED ORDER — MIDODRINE HCL 5 MG PO TABS
5.0000 mg | ORAL_TABLET | Freq: Three times a day (TID) | ORAL | Status: DC
  Administered 2019-10-26 – 2019-10-27 (×2): 5 mg via ORAL
  Filled 2019-10-26 (×2): qty 1

## 2019-10-26 MED ORDER — SODIUM CHLORIDE 0.9 % IV SOLN
1.0000 g | INTRAVENOUS | Status: DC
  Administered 2019-10-26 – 2019-10-28 (×3): 1 g via INTRAVENOUS
  Filled 2019-10-26: qty 10
  Filled 2019-10-26 (×2): qty 1

## 2019-10-26 MED ORDER — SODIUM CHLORIDE 0.9 % IV SOLN
INTRAVENOUS | Status: DC
  Administered 2019-10-26: 07:00:00 via INTRAVENOUS

## 2019-10-26 MED ORDER — DROPERIDOL 2.5 MG/ML IJ SOLN
2.5000 mg | Freq: Once | INTRAMUSCULAR | Status: AC
  Administered 2019-10-26: 2.5 mg via INTRAVENOUS

## 2019-10-26 MED ORDER — CHLORHEXIDINE GLUCONATE CLOTH 2 % EX PADS
6.0000 | MEDICATED_PAD | Freq: Every day | CUTANEOUS | Status: DC
  Administered 2019-10-29 – 2019-11-01 (×3): 6 via TOPICAL

## 2019-10-26 MED ORDER — INSULIN REGULAR(HUMAN) IN NACL 100-0.9 UT/100ML-% IV SOLN
INTRAVENOUS | Status: DC
  Administered 2019-10-26: 4 [IU]/h via INTRAVENOUS

## 2019-10-26 MED ORDER — ENOXAPARIN SODIUM 40 MG/0.4ML ~~LOC~~ SOLN
40.0000 mg | SUBCUTANEOUS | Status: DC
  Administered 2019-10-26 – 2019-10-28 (×3): 40 mg via SUBCUTANEOUS
  Filled 2019-10-26 (×3): qty 0.4

## 2019-10-26 MED ORDER — DEXTROSE 50 % IV SOLN: 0.0000 mL | INTRAVENOUS | Status: DC | PRN

## 2019-10-26 MED ORDER — DEXTROSE-NACL 5-0.45 % IV SOLN
INTRAVENOUS | Status: DC
  Administered 2019-10-26: 75 mL via INTRAVENOUS

## 2019-10-26 MED ORDER — INSULIN DETEMIR 100 UNIT/ML ~~LOC~~ SOLN
25.0000 [IU] | SUBCUTANEOUS | Status: DC
  Filled 2019-10-26: qty 0.25

## 2019-10-26 MED ORDER — MAGNESIUM SULFATE 2 GM/50ML IV SOLN
2.0000 g | Freq: Once | INTRAVENOUS | Status: AC
  Administered 2019-10-26: 2 g via INTRAVENOUS
  Filled 2019-10-26: qty 50

## 2019-10-26 MED ORDER — ACETAMINOPHEN 325 MG PO TABS
650.0000 mg | ORAL_TABLET | Freq: Four times a day (QID) | ORAL | Status: DC | PRN
  Administered 2019-10-27 – 2019-11-01 (×11): 650 mg via ORAL
  Filled 2019-10-26 (×11): qty 2

## 2019-10-26 MED ORDER — SODIUM CHLORIDE 0.9 % IV SOLN
INTRAVENOUS | Status: DC
  Administered 2019-10-27: 09:00:00 via INTRAVENOUS

## 2019-10-26 MED ORDER — SODIUM CHLORIDE 0.9 % IV SOLN
INTRAVENOUS | Status: DC
  Administered 2019-10-26: 03:00:00 via INTRAVENOUS

## 2019-10-26 MED ORDER — FLUDROCORTISONE ACETATE 0.1 MG PO TABS
0.1000 mg | ORAL_TABLET | Freq: Three times a day (TID) | ORAL | Status: DC | PRN
  Filled 2019-10-26: qty 1

## 2019-10-26 MED ORDER — INSULIN ASPART 100 UNIT/ML ~~LOC~~ SOLN
0.0000 [IU] | SUBCUTANEOUS | Status: DC
  Administered 2019-10-26 – 2019-10-27 (×2): 2 [IU] via SUBCUTANEOUS
  Administered 2019-10-27: 1 [IU] via SUBCUTANEOUS
  Filled 2019-10-26 (×4): qty 1

## 2019-10-26 MED ORDER — HYDROCODONE-ACETAMINOPHEN 5-325 MG PO TABS
1.0000 | ORAL_TABLET | Freq: Four times a day (QID) | ORAL | Status: DC | PRN
  Administered 2019-10-26: 1 via ORAL
  Administered 2019-10-27 (×3): 2 via ORAL
  Filled 2019-10-26: qty 1
  Filled 2019-10-26 (×3): qty 2

## 2019-10-26 MED ORDER — INSULIN REGULAR(HUMAN) IN NACL 100-0.9 UT/100ML-% IV SOLN
INTRAVENOUS | Status: DC
  Administered 2019-10-26: 8 [IU]/h via INTRAVENOUS
  Filled 2019-10-26 (×2): qty 100

## 2019-10-26 MED ORDER — DEXTROSE-NACL 5-0.45 % IV SOLN
INTRAVENOUS | Status: DC
  Administered 2019-10-26 – 2019-10-27 (×3): via INTRAVENOUS

## 2019-10-26 MED ORDER — SODIUM CHLORIDE 0.9 % IV BOLUS
2000.0000 mL | Freq: Once | INTRAVENOUS | Status: AC
  Administered 2019-10-26: 2000 mL via INTRAVENOUS

## 2019-10-26 MED ORDER — INSULIN DETEMIR 100 UNIT/ML ~~LOC~~ SOLN
25.0000 [IU] | SUBCUTANEOUS | Status: DC
  Administered 2019-10-26: 25 [IU] via SUBCUTANEOUS
  Filled 2019-10-26 (×2): qty 0.25

## 2019-10-26 NOTE — Consult Note (Signed)
I have placed a request via Secure Chat to Dr. Posey Pronto requesting photos of the wound areas of concern to be placed in the EMR.  [place] nurse will assess patient when back on the [facility] tomorrow.  [place], [place], [place]
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
## 2019-10-26 NOTE — Telephone Encounter (Signed)
Called and discussed.  [name] waking up.  Doing better currently.  Updated on clinic status.

## 2019-10-26 NOTE — ED Notes (Signed)
Suction attempted by Sam RN

## 2019-10-26 NOTE — Progress Notes (Signed)
   10/26/19 1321  Neurological  Level of Consciousness Responds to Pain  Orientation Level Other (comment) (UTA)  Cognition Poor attention/concentration;Unable to follow commands  Speech Delayed responses  Motor Function/Sensation Assessment Grip  R Hand Grip Absent  L Hand Grip Absent   Neuro Symptoms Drowsiness  Respiratory  Respiratory (WDL) X  Respiratory Pattern Irregular;Shallow  Chest Assessment Chest expansion symmetrical  Bilateral Breath Sounds Clear  Cardiac  Cardiac Rhythm SB (RN Nikeya Maxim notified)    MD notified of irregular breathing pattern and drops in o2 sats. Pt unable to be aroused and not following any commands at this time. MD to bedside, STAT ABG ordered at this time

## 2019-10-26 NOTE — Progress Notes (Signed)
[facility] OF CARE NOTE Patient: [name] ZRU:[id number]   PCP: Einar Pheasant, MD DOB: 10/23/1971   DOA: 10/26/2019   DOS: 10/26/2019    Patient was admitted by my colleague Dr. Hal Hope  earlier on 10/26/2019. I have reviewed the H&P as well as assessment and plan and agree with the same. Important changes in the plan are listed below.  Plan of care: Principal Problem:   DKA (diabetic ketoacidoses) ([place]) Active Problems:   Anemia   ARF (acute renal failure) (HCC) acute unresponsive episode Etiology note clear Ccm conuslted  ABG unremarkable  CT head shows possible stroke Check swallow screen, MRI brain Unknown Last known normal since cam eto ED with confusion   Elevated troponin. Cardiology consulted.  Author: Berle Mull, MD [facility] 10/26/2019 7:25 PM   If 7PM-7AM, please contact night-coverage at [email or web address]

## 2019-10-26 NOTE — ED Notes (Addendum)
Pt given meal tray at this time. Mother at bedside. Pt sleeping comfortably at this time

## 2019-10-26 NOTE — Consult Note (Signed)
Name: Yigit Norkus Lough MRN: [id number] DOB: 12-12-1970     CONSULTATION DATE: 10/26/2019 REFERRING MD : Posey Pronto  CHIEF COMPLAINT: Acute unresponsiveness  HISTORY OF PRESENT ILLNESS: 48 year old white male admitted to stepdown unit for severe DKA Patient has a history of incomplete quadriplegia Patient with recent history of left shoulder surgery for rotator cuff tear Patient initially was seen in the ER with a blood sugar of 500 Patient was started on insulin drip and transferred to the stepdown unit for further care and management  PCCM was contacted and called to evaluate for acute unresponsiveness Upon arrival into the room patient seemed to be confused however more alert and awake It seems that patient had syncopal episode with autonomic dysfunction ABG shows no significant acidosis or alkalosis Patient seemed to be agitated and in pain Patient has to be turned to his right side to help with his autonomic disregularity  At this time vital signs are stable Patient is satting 100% on room air Case discussed with primary care physician Will follow along  Patient is a DNR/DNI Primary care physician will verify this information   PAST MEDICAL HISTORY :   has a past medical history of Arthritis, Autonomic dysreflexia, Chronic pain, Depression, Fainting episodes, GERD (gastroesophageal reflux disease), History of frequent urinary tract infections, History of hiatal hernia, History of kidney stones, Low blood pressure, Nephrolithiasis, Quadriplegia, C5-C7, incomplete ([place]), Trigger finger, Type 1 diabetes mellitus ([place]), Urinary tract bacterial infections, and Urine incontinence.  has a past surgical history that includes Popliteal synovial cyst excision (2001); Bladder surgery; Cervical fusion (1988); Knee surgery; Colonoscopy (Oct 2014); kidney stone removal; Hemorrhoid surgery (N/A, 11/03/2016); Esophagogastroduodenoscopy (egd) with propofol (N/A, 04/19/2018); Colonoscopy with  propofol (N/A, 05/03/2018); Shoulder arthroscopy (Right, 06/15/2019); and Shoulder arthroscopy with rotator cuff repair and subacromial decompression (Left, 10/19/2019). Prior to Admission medications   Medication Sig Start Date End Date Taking? Authorizing Provider  baclofen (LIORESAL) 20 MG tablet TAKE TWO TABLETS THREE TIMES A DAY. Patient taking differently: Take 40 mg by mouth 3 (three) times daily as needed for muscle spasms.  05/25/19  Yes Einar Pheasant, MD  ciprofloxacin (CIPRO) 500 MG tablet Take 1 tablet (500 mg total) by mouth 2 (two) times daily for 5 days. 10/23/19 10/28/19 Yes Fausto Skillern, PA-C  Cyanocobalamin (B-12) 2500 MCG TABS Take 2,500 mg by mouth daily.    Yes [provider]  fludrocortisone (FLORINEF) 0.1 MG tablet Take 0.1 mg by mouth 3 (three) times daily as needed (low bp).    Yes [provider]  ibuprofen (ADVIL,MOTRIN) 200 MG tablet Take 600 mg by mouth 3 (three) times daily as needed (for pain.).    Yes [provider]  insulin detemir (LEVEMIR) 100 UNIT/ML injection Inject 29 units at bedtime Patient taking differently: Inject 29 Units into the skin at bedtime.  08/11/18  Yes Einar Pheasant, MD  Insulin Lispro (HUMALOG JUNIOR KWIKPEN [place]) Inject 1-6 Units into the skin 3 (three) times daily. If blood sugar is less than 60, call MD. If blood sugar is 120 to 170, give 1 unit. If blood sugar is 171 to 220, give 2 units. If blood sugar is 221 to 270, give 3 units. If blood sugar is 271 to 321, give 4 units. If blood sugar is 322 to 371, give 5 units. If blood sugar is 372 to 421, give 6 units. If blood sugar is greater than 450, call MD.   Yes [provider]  methenamine (MANDELAMINE) 1 G tablet Take  1,000 mg by mouth 2 (two) times daily.   Yes [provider]  naphazoline-glycerin (CLEAR EYES) 0.012-0.2 % SOLN Place 1-2 drops into both eyes 4 (four) times daily as needed for irritation.   Yes [provider]   omeprazole (PRILOSEC) 40 MG capsule Take 40 mg by mouth 2 (two) times daily before a meal.    Yes [provider]  Oral Electrolytes (BUFFERED SALT PO) Take 2 tablets by mouth daily as needed (low blood pressure).   Yes [provider]  oxyCODONE (OXY IR/ROXICODONE) 5 MG immediate release tablet Take 5-10 mg by mouth every 4 (four) hours as needed for moderate pain or severe pain.   Yes [provider]  shark liver oil-cocoa butter (PREPARATION H) 0.25-88.44 % suppository Place 1 suppository rectally 2 (two) times daily.   Yes [provider]  sucralfate (CARAFATE) 1 g tablet Take 1 g by mouth 3 (three) times daily before meals.    Yes [provider]  vitamin C (ASCORBIC ACID) 250 MG tablet Take 1,000 mg by mouth 2 (two) times daily.    Yes [provider]  B-D UF III MINI PEN NEEDLES 31G X 5 MM MISC AS DIRECTED. 12/25/16   Einar Pheasant, MD  blood glucose meter kit and supplies KIT Dispense Dexcom G6 Sensor meter to check blood sugars up to 4 times daily. DX code E 10.9 12/26/18   Einar Pheasant, MD   Allergies  Allergen Reactions  . Decongestant [Pseudoephedrine Hcl] Other (See Comments)    Cause UTIs  . Ivp Dye [Iodinated Diagnostic Agents] Hives and Other (See Comments)    Urticaria   . Sulfa Antibiotics Swelling and Other (See Comments)    Tongue swells    FAMILY HISTORY:  family history includes Heart disease in his father; Hyperlipidemia in his father; Hypertension in his father. SOCIAL HISTORY:  reports that he has never smoked. His smokeless tobacco use includes snuff. He reports current alcohol use. He reports previous drug use. Drug: Marijuana.    Review of Systems:  Gen:  Denies  fever, sweats, chills weigh loss  HEENT: Denies blurred vision, double vision, ear pain, eye pain, hearing loss, nose bleeds, sore throat Cardiac:  No dizziness, chest pain or heaviness, chest tightness,edema, No JVD Resp:   No cough,  -sputum production, +shortness of breath,-wheezing, -hemoptysis,  Gi: Denies swallowing difficulty, stomach pain, nausea or vomiting, diarrhea, constipation, bowel incontinence Gu:  Denies bladder incontinence, burning urine Ext:   Denies Joint pain, stiffness or swelling Skin: Denies  skin rash, easy bruising or bleeding or hives Endoc:  Denies polyuria, polydipsia , polyphagia or weight change Psych:   Denies depression, insomnia or hallucinations  Other:  All other systems negative     VITAL SIGNS: Temp:  [95.7 F (35.4 C)-97.7 F (36.5 C)] 95.7 F (35.4 C) (11/19 1048) Pulse Rate:  [42-79] 58 (11/19 1400) Resp:  [0-20] 14 (11/19 1400) BP: (79-130)/(49-102) 108/56 (11/19 1400) SpO2:  [95 %-100 %] 98 % (11/19 1400) Weight:  [75.5 kg-79.4 kg] 75.5 kg (11/19 1048)     SpO2: 98 %    Physical Examination:   GENERAL:NAD, no fevers, chills, no weakness no fatigue HEAD: Normocephalic, atraumatic.  EYES: PERLA, EOMI No scleral icterus.  MOUTH: Moist mucosal membrane.  EAR, NOSE, THROAT: Clear without exudates. No external lesions.  NECK: Supple.  PULMONARY: CTA B/L no wheezing, rhonchi, crackles CARDIOVASCULAR: S1 and S2. Regular rate and rhythm. No murmurs GASTROINTESTINAL: Soft, nontender, nondistended. Positive bowel sounds.  MUSCULOSKELETAL: No swelling, clubbing, or edema.  NEUROLOGIC: , Right arm with autonomic dysreflexia , lower extremities 0 out of 5 weakness  SKIN: No ulceration, lesions, rashes, or cyanosis.  PSYCHIATRIC: Insight, judgment intact. -depression -anxiety ALL OTHER ROS ARE NEGATIVE   MEDICATIONS: I have reviewed all medications and confirmed regimen as documented    CULTURE RESULTS   Recent Results (from the past 240 hour(s))  SARS CORONAVIRUS 2 (TAT 6-24 HRS) Nasopharyngeal Nasopharyngeal Swab     Status: None   Collection Time: 10/23/19 10:08 AM   Specimen: Nasopharyngeal Swab  Result Value Ref Range Status   SARS Coronavirus 2 NEGATIVE  NEGATIVE Final    Comment: (NOTE) SARS-CoV-2 target nucleic acids are NOT DETECTED. The SARS-CoV-2 RNA is generally detectable in upper and lower respiratory specimens during the acute phase of infection. Negative results do not preclude SARS-CoV-2 infection, do not rule out co-infections with other pathogens, and should not be used as the sole basis for treatment or other patient management decisions. Negative results must be combined with clinical observations, patient history, and epidemiological information. The expected result is Negative. Fact Sheet for Patients: SugarRoll.be Fact Sheet for Healthcare Providers: https://www.woods-mathews.com/ This test is not yet approved or cleared by the [place] FDA and  has been authorized for detection and/or diagnosis of SARS-CoV-2 by FDA under an Emergency Use Authorization (EUA). This EUA will remain  in effect (meaning this test can be used) for the duration of the COVID-19 declaration under Section 56 4(b)(1) of the Act, 21 U.S.C. section 360bbb-3(b)(1), unless the authorization is terminated or revoked sooner. Performed at [facility], [place] [address] [place], [place] [address]   SARS CORONAVIRUS 2 (TAT 6-24 HRS) Nasopharyngeal Nasopharyngeal Swab     Status: None   Collection Time: 10/26/19  2:39 AM   Specimen: Nasopharyngeal Swab  Result Value Ref Range Status   SARS Coronavirus 2 NEGATIVE NEGATIVE Final    Comment: (NOTE) SARS-CoV-2 target nucleic acids are NOT DETECTED. The SARS-CoV-2 RNA is generally detectable in upper and lower respiratory specimens during the acute phase of infection. Negative results do not preclude SARS-CoV-2 infection, do not rule out co-infections with other pathogens, and should not be used as the sole basis for treatment or other patient management decisions. Negative results must be combined with clinical observations, patient history, and  epidemiological information. The expected result is Negative. Fact Sheet for Patients: SugarRoll.be Fact Sheet for Healthcare Providers: https://www.woods-mathews.com/ This test is not yet approved or cleared by the [place] FDA and  has been authorized for detection and/or diagnosis of SARS-CoV-2 by FDA under an Emergency Use Authorization (EUA). This EUA will remain  in effect (meaning this test can be used) for the duration of the COVID-19 declaration under Section 56 4(b)(1) of the Act, 21 U.S.C. section 360bbb-3(b)(1), unless the authorization is terminated or revoked sooner. Performed at [facility], [place] [address] [place], [place] [address]           IMAGING    Dg Chest Portable 1 View  Result Date: 10/26/2019 CLINICAL DATA:  Hypotension, DKA EXAM: PORTABLE CHEST 1 VIEW COMPARISON:  09/18/2015 FINDINGS: Heart and mediastinal contours are within normal limits. No focal opacities or effusions. No acute bony abnormality. IMPRESSION: No active disease. Electronically Signed   By: Rolm Baptise M.D.   On: 10/26/2019 02:30       ASSESSMENT AND PLAN SYNOPSIS  Acute unresponsiveness due to vasovagal syncope due to autonomic dysfunction secondary to  underlying quadriplegia in the setting of severe DKA Patient is a DNR/DNI Continue stepdown status  ENDO-DKA - ICU hypoglycemic\Hyperglycemia protocol -check FSBS per protocol Check CBC CMP check UA to assess for UTI Continue stepdown status monitoring, check EKG  Case discussed with ICU nurse and primary care physician Will continue to follow along   Corrin Parker, M.D.  [facility]  Medical Director [place] Director [place] Department

## 2019-10-26 NOTE — Telephone Encounter (Signed)
Copied from [place] [id number]. Topic: General - Inquiry >> Oct 26, 2019  9:50 AM Richardo Priest, NT wrote: Reason for CRM: Patient's father called in, [name], stating he was instructed by wife to call office to update [name] and Dr.Scott of what is going on in regards to patient's health. Pt's father is very concerned and anxious due to son having to be taken to ED for a very high blood sugar.Pt's father does not know how high the blood sugar is and cannot go back to see his son due to visitor policy in place. Pt's mother is with him currently and pt's father is asking that [name] give her a call ASAP to [phone] to receive an update. Pt's family is very concerned and father kept repeating he does not want son to die and that no one is properly treating his son at the [facility] [place] locations and at [facility]. Please advise.

## 2019-10-26 NOTE — Progress Notes (Signed)
Inpatient Diabetes Program Recommendations  AACE/ADA: New Consensus Statement on Inpatient Glycemic Control   Target Ranges:  Prepandial:   less than 140 mg/dL      Peak postprandial:   less than 180 mg/dL (1-2 hours)      Critically ill patients:  140 - 180 mg/dL  Results for [name], [name] (MRN [id number]) as of 10/26/2019 08:07  Ref. Range 10/26/2019 01:18 10/26/2019 03:37 10/26/2019 04:17 10/26/2019 05:41 10/26/2019 06:40  Glucose-Capillary Latest Ref Range: 70 - 99 mg/dL 536 (HH) 503 (HH) 399 (H) 98 345 (H)  Results for [name], [name] (MRN [id number]) as of 10/26/2019 08:07  Ref. Range 10/26/2019 06:11  CO2 Latest Ref Range: 22 - 32 mmol/L 12 (L)   Results for [name], [name] (MRN [id number]) as of 10/26/2019 08:07  Ref. Range 10/26/2019 06:11  Anion gap Latest Ref Range: 5 - 15  20 (H)   Review of Glycemic Control  Diabetes history: DM1 (makes NO insulin; requires basal, correction, and carbohydrate coverage insulin) Outpatient Diabetes medications:Levemir 29 units QHS, Humalog 1-6 units TID with meals (was taking Levemir 11 units QHS, Humalog 1-50 units TID with meals (for correction and meal coverage) in which 1 unit covers 5 grams of carbs and 1 unit drops glucose 50 mg/dl) prior to hospital discharge on 10/24/19) Current orders for Inpatient glycemic control: IV insulin   Inpatient Diabetes Program Recommendations:   Transition from IV to SQ insulin: Per labs at 6:11 am today patient remains acidotic. Once acidosis is cleared and MD is ready to transition from IV to SQ insulin, please consider ordering Levemir 11 units Q24H, Novolog 0-6 units AC&HS with meals (very sensitive scale for correction), Novolog 0-15 units TID with meals for meal coverage (1 unit for every 5 grams of carbohydrates) if patient eats at least 50% of meals.  Outpatient DM medications: At time of discharge, please discharge patient on similar insulin regimen that is used  while inpatient if patient has fairly good glycemic control. Since patient has Type1 DM, he will need basal, correction, and meal coverage insulin as he makes NO insulin at all.   NOTE: In reviewing chart noted patient was inpatient 10/19/19-10/24/19 and was seen by Diabetes Coordinator on 10/20/19. Patient has DM1 so he makes no insulin and is sensitive to insulin. Per note on 10/20/19 by J. Rosebud Poles, RN (Diabetes Coordinator) patient reported he takes "Levemir 11 units QHS but pt stated he will titrate his Levemir dose upward as needed based on his CBGs.  Also takes Novolog (or Humalog--whatever the insurance will cover at the time) on the following basis: 3 units Novolog for every 15 grams of Carbohydrates consumed (or in other words, 1 unit for every 5 grams carbohydrates) and Novolog 1 unit for every 50 mg/dl above his target CBG of 120 mg/dl."  While inpatient, patient received Levemir 11 units daily. However, per discharge summary Levemir was increased to 29 units QHS and Humalog changed to as needed. At time of discharge, patient will require basal, meal coverage, and correction insulin be prescribed for management of Type1 DM.  Thanks, Barnie Alderman, RN, MSN, CDE Diabetes Coordinator Inpatient Diabetes Program [phone] (Team Pager from 8am to 5pm)

## 2019-10-26 NOTE — ED Notes (Signed)
Left shoulder immobilizer placed on pt by scott EDT and Sam RN

## 2019-10-26 NOTE — Telephone Encounter (Signed)
Copied from [place] [id number]. Topic: General - Other >> Oct 26, 2019  2:16 PM Rainey Pines A wrote: Dr. Posey Pronto from [facility] would like a callback from Dr .Nicki Reaper as soon as possible in regards to patient. Dr. Posey Pronto called office and call dropped. Please advise [phone]
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
## 2019-10-26 NOTE — Telephone Encounter (Signed)
Copied from [place] [phone]. Topic: General - Other >> Oct 26, 2019  2:16 PM Rainey Pines A wrote: Dr. Posey Pronto from [facility] would like a callback from Dr .Nicki Reaper as soon as possible in regards to patient. Dr. Posey Pronto called office and call dropped. Please advise [phone]

## 2019-10-26 NOTE — Telephone Encounter (Addendum)
Patient has been moved to ICU, Patient was brought in at 1 this morning , patient mother said they were not doing right at [facility] , she cannot go to ICU with him until he tested for COVID, Patient mother said he is acting crazy, delirium,he keeps telling everyone he has to cough. Patient said last night he tried to tell the nurse he counts his carbs and adjust his insulin and manages his diabetes according to the carbs he eats, [facility] was giving 3 times daily, On orders given. Dr. Leana Gamer could not  Go on the floor have a virtual meeting to discuss insulin today but something happen during the night and he ended up in the hospital with high blood sugar. Patient mother and father just wanted to make "PCP and Nurse aware and was greatful for our concern , that speaking with [facility] yesterday all request were met as to bedside rails , trapeze, and as to PT/OT and ROM , and until CBG"S went up felt everything was ok" until the call he was being taken to ER and now he is being admitted to ICU. Mother had talked with physicians at [facility] on 10/25/19 and felt plans were in place and this was just surprising and shocking he is so unstable to day.

## 2019-10-26 NOTE — ED Notes (Signed)
[name] (pt's mother)- [phone]

## 2019-10-26 NOTE — ED Notes (Signed)
Pt has become increasingly more confused and lethargic than he was on arrival. Pt wailing arms around grabbing arms, mumbling and is restless in the bed. Pt is alert to voice. A&o x4 however has confusion with other questions.

## 2019-10-26 NOTE — ED Notes (Signed)
Called lab to add on urine drug screen to previous urine sample

## 2019-10-26 NOTE — Telephone Encounter (Signed)
Noted.  Thank you for the update.  Let me know if I need to do anything more.

## 2019-10-26 NOTE — ED Triage Notes (Signed)
Pt arrived via [facility] from [facility] with hyperglycemia. Pt cbg >500. Pt alert and orientedx4. PT is DNR but does not have paperwork with him.

## 2019-10-26 NOTE — ED Notes (Signed)
Pt sitting up in bed. Pt states he needs to cough. RN Katie at bedside

## 2019-10-26 NOTE — ED Notes (Signed)
Date and time results received: 10/26/19 10:03 AM    Test: Troponin Critical Value: 430  Name of Provider Notified: Dr Posey Pronto

## 2019-10-26 NOTE — ED Notes (Signed)
Pt repositioned at this time. Pt dry.

## 2019-10-26 NOTE — Telephone Encounter (Signed)
This is the message I discussed with you this AM.

## 2019-10-26 NOTE — ED Notes (Signed)
Attempt to call report made to the ICU. Charge nurse [place] said a nurse will be available at 1045am to take the patient. ED Charge Marya Amsler made aware.

## 2019-10-26 NOTE — Telephone Encounter (Signed)
Called and left pt message to call me back.

## 2019-10-26 NOTE — ED Notes (Signed)
Family arrived at bedside. Per pt's mom he had recent left shoulder surgery and is suppose to have on a shoulder immobilizer. Informed mother that pt did not arrive with it in place. Stated to mother that we would get one and place it on pt.

## 2019-10-26 NOTE — ED Notes (Signed)
Pt has large moveable lump to right upper arm. Per family pt had orthopedic surgery so this is baseline for him.

## 2019-10-27 ENCOUNTER — Inpatient Hospital Stay
Admit: 2019-10-27 | Discharge: 2019-10-27 | Disposition: A | Discharge status: Home / Self Care | Payer: Medicare Other | Attending: Nurse Practitioner | Admitting: Nurse Practitioner

## 2019-10-27 ENCOUNTER — Inpatient Hospital Stay (HOSPITAL_COMMUNITY)
Admit: 2019-10-27 | Discharge: 2019-10-27 | Disposition: A | Discharge status: Home / Self Care | Payer: Medicare Other | Attending: Internal Medicine | Admitting: Internal Medicine

## 2019-10-27 ENCOUNTER — Inpatient Hospital Stay: Payer: Medicare Other

## 2019-10-27 DIAGNOSIS — I6310 Cerebral infarction due to embolism of unspecified precerebral artery: Secondary | ICD-10-CM

## 2019-10-27 DIAGNOSIS — I361 Nonrheumatic tricuspid (valve) insufficiency: Secondary | ICD-10-CM

## 2019-10-27 DIAGNOSIS — I951 Orthostatic hypotension: Secondary | ICD-10-CM

## 2019-10-27 DIAGNOSIS — I340 Nonrheumatic mitral (valve) insufficiency: Secondary | ICD-10-CM

## 2019-10-27 LAB — COMPREHENSIVE METABOLIC PANEL
ALT: 17 U/L (ref 0–44)
AST: 25 U/L (ref 15–41)
Albumin: 3 g/dL — ABNORMAL LOW (ref 3.5–5.0)
Alkaline Phosphatase: 68 U/L (ref 38–126)
Anion gap: 9 (ref 5–15)
BUN: 17 mg/dL (ref 6–20)
CO2: 23 mmol/L (ref 22–32)
Calcium: 8.2 mg/dL — ABNORMAL LOW (ref 8.9–10.3)
Chloride: 102 mmol/L (ref 98–111)
Creatinine, Ser: 0.59 mg/dL — ABNORMAL LOW (ref 0.61–1.24)
GFR calc Af Amer: >60 mL/min (ref >60)
GFR calc non Af Amer: >60 mL/min (ref >60)
Glucose, Bld: 114 mg/dL — ABNORMAL HIGH (ref 70–99)
Potassium: 3.3 mmol/L — ABNORMAL LOW (ref 3.5–5.1)
Sodium: 134 mmol/L — ABNORMAL LOW (ref 135–145)
Total Bilirubin: 0.6 mg/dL (ref 0.3–1.2)
Total Protein: 5.5 g/dL — ABNORMAL LOW (ref 6.5–8.1)

## 2019-10-27 LAB — CBC WITH DIFFERENTIAL/PLATELET
Abs Immature Granulocytes: 0.03 10*3/uL (ref 0.00–0.07)
Basophils Absolute: 0 10*3/uL (ref 0.0–0.1)
Basophils Relative: 0 %
Eosinophils Absolute: 0.1 10*3/uL (ref 0.0–0.5)
Eosinophils Relative: 1 %
HCT: 29.1 % — ABNORMAL LOW (ref 39.0–52.0)
Hemoglobin: 10.1 g/dL — ABNORMAL LOW (ref 13.0–17.0)
Immature Granulocytes: 0 %
Lymphocytes Relative: 11 %
Lymphs Abs: 0.8 10*3/uL (ref 0.7–4.0)
MCH: 32.3 pg (ref 26.0–34.0)
MCHC: 34.7 g/dL (ref 30.0–36.0)
MCV: 93 fL (ref 80.0–100.0)
Monocytes Absolute: 0.5 10*3/uL (ref 0.1–1.0)
Monocytes Relative: 7 %
Neutro Abs: 5.8 10*3/uL (ref 1.7–7.7)
Neutrophils Relative %: 81 %
Platelets: 235 10*3/uL (ref 150–400)
RBC: 3.13 MIL/uL — ABNORMAL LOW (ref 4.22–5.81)
RDW: 12 % (ref 11.5–15.5)
WBC: 7.1 10*3/uL (ref 4.0–10.5)
nRBC: 0 % (ref 0.0–0.2)

## 2019-10-27 LAB — GLUCOSE, CAPILLARY
Glucose-Capillary: 122 mg/dL — ABNORMAL HIGH (ref 70–99)
Glucose-Capillary: 84 mg/dL (ref 70–99)
Glucose-Capillary: 129 mg/dL — ABNORMAL HIGH (ref 70–99)
Glucose-Capillary: 35 mg/dL — CL (ref 70–99)
Glucose-Capillary: 239 mg/dL — ABNORMAL HIGH (ref 70–99)
Glucose-Capillary: 205 mg/dL — ABNORMAL HIGH (ref 70–99)
Glucose-Capillary: 312 mg/dL — ABNORMAL HIGH (ref 70–99)
Glucose-Capillary: 142 mg/dL — ABNORMAL HIGH (ref 70–99)
Glucose-Capillary: 111 mg/dL — ABNORMAL HIGH (ref 70–99)

## 2019-10-27 LAB — ECHOCARDIOGRAM COMPLETE
Height: 72 in
Weight: 2663.16 oz

## 2019-10-27 LAB — ANTITHROMBIN III: AntiThromb III Func: 79 % (ref 75–120)

## 2019-10-27 LAB — SEDIMENTATION RATE: Sed Rate: 26 mm/hr — ABNORMAL HIGH (ref 0–15)

## 2019-10-27 LAB — MAGNESIUM: Magnesium: 1.7 mg/dL (ref 1.7–2.4)

## 2019-10-27 LAB — PHOSPHORUS: Phosphorus: 3.1 mg/dL (ref 2.5–4.6)

## 2019-10-27 MED ORDER — POTASSIUM CHLORIDE 10 MEQ/100ML IV SOLN
10.0000 meq | INTRAVENOUS | Status: DC
  Filled 2019-10-27 (×3): qty 100

## 2019-10-27 MED ORDER — INSULIN DETEMIR 100 UNIT/ML ~~LOC~~ SOLN
15.0000 [IU] | SUBCUTANEOUS | Status: DC
  Administered 2019-10-27: 15 [IU] via SUBCUTANEOUS
  Filled 2019-10-27 (×2): qty 0.15

## 2019-10-27 MED ORDER — STROKE: EARLY STAGES OF RECOVERY BOOK: Freq: Once | Status: DC

## 2019-10-27 MED ORDER — MAGNESIUM SULFATE 2 GM/50ML IV SOLN
2.0000 g | Freq: Once | INTRAVENOUS | Status: AC
  Administered 2019-10-27: 2 g via INTRAVENOUS
  Filled 2019-10-27: qty 50

## 2019-10-27 MED ORDER — POTASSIUM CHLORIDE 10 MEQ/100ML IV SOLN
10.0000 meq | INTRAVENOUS | Status: AC
  Administered 2019-10-27 (×3): 10 meq via INTRAVENOUS
  Filled 2019-10-27: qty 100

## 2019-10-27 MED ORDER — DEXTROSE-NACL 5-0.45 % IV SOLN
INTRAVENOUS | Status: DC
  Administered 2019-10-27: 12:00:00 via INTRAVENOUS

## 2019-10-27 MED ORDER — ASPIRIN 325 MG PO TABS
325.0000 mg | ORAL_TABLET | Freq: Every day | ORAL | Status: DC
  Administered 2019-10-27 – 2019-11-01 (×6): 325 mg via ORAL
  Filled 2019-10-27 (×6): qty 1

## 2019-10-27 MED ORDER — ASPIRIN 300 MG RE SUPP
300.0000 mg | Freq: Every day | RECTAL | Status: DC
  Filled 2019-10-27 (×3): qty 1

## 2019-10-27 MED ORDER — INSULIN ASPART 100 UNIT/ML ~~LOC~~ SOLN
2.0000 [IU] | Freq: Three times a day (TID) | SUBCUTANEOUS | Status: DC
  Administered 2019-10-28: 15 [IU] via SUBCUTANEOUS
  Administered 2019-10-28: 2 [IU] via SUBCUTANEOUS
  Administered 2019-10-28: 6 [IU] via SUBCUTANEOUS
  Administered 2019-10-28: 3 [IU] via SUBCUTANEOUS
  Administered 2019-10-29: 8 [IU] via SUBCUTANEOUS
  Administered 2019-10-29: 3 [IU] via SUBCUTANEOUS
  Administered 2019-10-30: 6 [IU] via SUBCUTANEOUS
  Administered 2019-10-30 – 2019-10-31 (×2): 15 [IU] via SUBCUTANEOUS
  Filled 2019-10-27 (×10): qty 1

## 2019-10-27 MED ORDER — HYDROCODONE-ACETAMINOPHEN 5-325 MG PO TABS
1.0000 | ORAL_TABLET | ORAL | Status: DC | PRN
  Administered 2019-10-27 – 2019-10-28 (×3): 2 via ORAL
  Filled 2019-10-27 (×3): qty 2

## 2019-10-27 MED ORDER — INSULIN ASPART 100 UNIT/ML ~~LOC~~ SOLN
18.0000 [IU] | Freq: Once | SUBCUTANEOUS | Status: AC
  Administered 2019-10-27: 14 [IU] via SUBCUTANEOUS

## 2019-10-27 MED ORDER — BACLOFEN 10 MG PO TABS
40.0000 mg | ORAL_TABLET | Freq: Three times a day (TID) | ORAL | Status: DC
  Administered 2019-10-27: 40 mg via ORAL
  Filled 2019-10-27 (×3): qty 4

## 2019-10-27 MED ORDER — POTASSIUM CHLORIDE CRYS ER 20 MEQ PO TBCR: 20.0000 meq | EXTENDED_RELEASE_TABLET | ORAL | Status: DC

## 2019-10-27 MED ORDER — BACLOFEN 10 MG PO TABS
40.0000 mg | ORAL_TABLET | Freq: Three times a day (TID) | ORAL | Status: DC | PRN
  Administered 2019-10-27 – 2019-11-01 (×13): 40 mg via ORAL
  Filled 2019-10-27 (×15): qty 4

## 2019-10-27 NOTE — Progress Notes (Signed)
SLP Cancellation Note  Patient Details Name: Kyheem Bathgate Brickel MRN: [id number] DOB: [birth year]   Cancelled treatment:       Reason Eval/Treat Not Completed: SLP screened, no needs identified, will sign off(chart reviewed; consulted NSG and met w/ pt). Pt conversed at conversational level w/out deficits noted; pt and family denied any new speech-language deficits. NSG agreed stating pt has been answering questions and following instructions w/ her this AM. No further skilled ST services indicated as pt appears at his baseline. Pt agreed. NSG to reconsult if any change in status.     Orinda Kenner, MS, CCC-SLP Kenyetta Wimbish 10/27/2019, 12:39 PM
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
## 2019-10-27 NOTE — Evaluation (Signed)
Physical Therapy Evaluation Patient Details Name: [name] MRN: [id number] DOB: 30-Oct-1971 Today's Date: 10/27/2019   History of Present Illness  Pt is a 48 y.o. male presenting to hospital 10/26/19 with elevated blood sugar (more than 500) and AMS.  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  Of note, pt s/p L rotator cuff repair 10/19/19 (discharged from hospital 10/25/19).  PMH includes C6/C7 incomplete quadriplegia, DM type I, autonomic dysreflexia, neurogenic bladder, h/o L knee surgery; pt also reports h/o L hip dislocation/issues s/p L knee surgery.  Clinical Impression  Pt seen for PT/OT co-evaluation/treatment.  Pt recently discharged to [facility] s/p L rotator cuff repair.  Prior to recent hospital admissions, pt was living alone in 1 level apt with a ramp to enter; was modified independent with w/c level functional mobility and ADL/IADL's.  During therapy session today, trialed pt with progression into partial chair mode (HOB elevated and B LE's lowered) while monitoring pt's BP.  No c/o dizziness or lightheadedness (or any other adverse symptoms) during session's activities and BP monitored (see below for additional details); BP at beginning of session was 148/131; 180/78 and 180/104 when in partial chair mode; and 174/103 end of session when taken out of chair mode (HOB 31 degrees with legs straight per pt request).  Pt would benefit from skilled PT to address noted impairments and functional limitations (see below for any additional details) with focus on B LE ROM (pt requesting no hip abduction ROM d/t concerns for dislocation) and progression to hoyer lift trial (pending pt's tolerance--symptoms and BP--  to upright positioning and monitoring for autonomic dysreflexia).  Upon hospital discharge, recommend pt discharge to [facility].    Follow Up Recommendations SNF    Equipment Recommendations  (single arm w/c; hoyer lift)    Recommendations for Other Services OT consult     Precautions / Restrictions Precautions Precautions: Shoulder;Fall Type of Shoulder Precautions: Awaiting orthopedic consult at time of OT/PT evaluation. LUE treated as NWB with no AROM, AAROM, or PROM performed during assessment. Pt with white sling/immobilizer applied in the ED in place at start/end of session. Restrictions Weight Bearing Restrictions: Yes LUE Weight Bearing: Non weight bearing Other Position/Activity Restrictions: No L hip aBduction ROM (per pt request d/t concerns for dislocation)      Mobility  Bed Mobility Overal bed mobility: Needs Assistance             General bed mobility comments: Max assist x2 for repositioning legs, hips, and torso in bed (limited d/t L UE immobilization)  Transfers                 General transfer comment: Deferred  Ambulation/Gait                Stairs            Wheelchair Mobility    Modified Rankin (Stroke Patients Only)       Balance Overall balance assessment: Needs assistance Sitting-balance support: (assist to reposition self in bed)                                         Pertinent Vitals/Pain Pain  Assessment: Faces Faces Pain Scale: Hurts little more Pain Location: Buttocks/sacrum; L shoulder Pain Descriptors / Indicators: Sore;Guarding;Grimacing Pain Intervention(s): Limited activity within patient's tolerance;Monitored during session;Premedicated before session;Repositioned  HR and O2 sats on room air WFL during session's activities    Home Living Family/patient expects to be discharged to:: Skilled nursing facility Living Arrangements: Alone   Type of Home: House Home Access: Ramped  entrance;Elevator     Home Layout: One level Home Equipment: Wheelchair - manual;Shower seat Additional Comments: HOB/FOB tilt; electric scooter attachment for WC; slideboard    Prior Function Level of Independence: Independent with assistive device(s)   Gait / Transfers Assistance Needed: lateral scoot transfers or slide board transfers WC to/from bed/car  ADL's / Homemaking Assistance Needed: Pt has well established modified independent self care/ADL routines 2/2 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        Hand Dominance   Dominant Hand: (Pt reports uses BUE consistently since his SCI.)    Extremity/Trunk Assessment   Upper Extremity Assessment Upper Extremity Assessment: Defer to OT evaluation RUE Deficits / Details: per OT "limited sensation at the brachium; gross and fine motor impairments of digits 1 and 2; baseline gross use of RUE for WC propulsion and self care. Pt reports strength and coordination are at baseline, however since recent strokes does feel "pins and needle tingling" in his RUE." LUE Deficits / Details: per OT "full recovery following SCI but now very limited 2/2 shoulder surgery and ROM restrictions" LUE: Unable to fully assess due to pain;Unable to fully assess due to immobilization    Lower Extremity Assessment Lower Extremity Assessment: (B DF to neutral ROM; B hip flexion and knee flexion/extension WFL)    Cervical / Trunk Assessment Cervical / Trunk Assessment: (no strength/activation below "armpit")  Communication   Communication: No difficulties  Cognition Arousal/Alertness: Awake/alert Behavior During Therapy: WFL for tasks assessed/performed;Anxious Overall Cognitive Status: Within Functional Limits for tasks assessed                                 General Comments: Pt at times appeared very anxious regarding upcoming ultrasound of his shoulder and was concerned that hospital staff would not come in if they saw  OT/PT working with him (pt was regularly asking therapists to check with staff outside his door to make sure they were not there for his ultrasound)      General Comments General comments (skin integrity, edema, etc.): Pt BP readings monitored t/o session as sequentially as follows: Supine 148/131, 166/115, 158/113; Semi-reclined: 182/99, 157/99, 165/92; Bed in partial chair mode with head elevated and feet lowered: 180/78, 180/14; Pt returned to semi-supine with HOB ~30 degrees and legs flat at end of session: 187/108, 174/103. Pt left with condom catheter in place, all lines and leads intact from start of session, and prevalon boots donned.  Nurse cleared pt for participation in therapy and trial pt in chair mode (in bed) while monitoring BP.    Exercises Total Joint Exercises Heel Slides: PROM;Both;10 reps;Supine Other Exercises Other Exercises: OT/PT engage pt in attempted positional change with bed in chair position. Further mobility deferred at this time while awaiting orthopedic consult for shoulder precautions. OT educates pt on safety precautions for monitoring blood pressure with mobility and maintaining safe positioning of his LUE. Other Exercises: OT/PT assist pt with repositioning in bed to maximize safety, skin integrity and comfort this date.   Assessment/Plan  PT Assessment Patient needs continued PT services  PT Problem List Decreased range of motion;Decreased mobility;Decreased balance;Decreased activity tolerance;Cardiopulmonary status limiting activity;Decreased skin integrity       PT Treatment Interventions DME instruction;Functional mobility training;Therapeutic activities;Therapeutic exercise;Balance training;Neuromuscular re-education;Patient/family education    PT Goals (Current goals can be found in the Care Plan section)  Acute Rehab PT Goals Patient Stated Goal: To finish rehab and get stronger PT Goal Formulation: With patient Time For Goal Achievement:  11/10/19 Potential to Achieve Goals: Fair    Frequency 7X/week   Barriers to discharge Decreased caregiver support      Co-evaluation PT/OT/SLP Co-Evaluation/Treatment: Yes Reason for Co-Treatment: Complexity of the patient's impairments (multi-system involvement);For patient/therapist safety PT goals addressed during session: Mobility/safety with mobility;Strengthening/ROM OT goals addressed during session: ADL's and self-care       AM-PAC PT "6 Clicks" Mobility  Outcome Measure Help needed turning from your back to your side while in a flat bed without using bedrails?: A Lot Help needed moving from lying on your back to sitting on the side of a flat bed without using bedrails?: Total Help needed moving to and from a bed to a chair (including a wheelchair)?: Total Help needed standing up from a chair using your arms (e.g., wheelchair or bedside chair)?: Total Help needed to walk in hospital room?: Total Help needed climbing 3-5 steps with a railing? : Total 6 Click Score: 7    End of Session   Activity Tolerance: Patient tolerated treatment well Patient left: in bed;with call bell/phone within reach;with bed alarm set;Other (comment)(B LE's in PRAFO's) Nurse Communication: Mobility status;Precautions;Other (comment)(pt's BP and tolerance to activity) PT Visit Diagnosis: Other abnormalities of gait and mobility (R26.89);Muscle weakness (generalized) (M62.81);Pain Pain - Right/Left: Left Pain - part of body: Shoulder    Time: 1751-0258 PT Time Calculation (min) (ACUTE ONLY): 49 min   Charges:   PT Evaluation $PT Eval Low Complexity: 1 Low PT Treatments $Therapeutic Activity: 8-22 mins       Leitha Bleak, PT 10/27/19, 5:40 PM

## 2019-10-27 NOTE — Progress Notes (Signed)
*  PRELIMINARY RESULTS* Echocardiogram 2D Echocardiogram has been performed.  Sherrie Sport 10/27/2019, 12:30 PM

## 2019-10-27 NOTE — Progress Notes (Addendum)
SYNOPSIS  CC  Follow up syncope   HPI Awake this AM Started on midodrine No more syncopal episodes   MRI +for acute CVA    VITALS: BP (!) 144/95   Pulse 72   Temp 98 F (36.7 C) (Axillary)   Resp 16   Ht 6' (1.829 m)   Wt 75.5 kg   SpO2 99%   BMI 22.57 kg/m     I/O last 3 completed shifts: In: 5348.5 [I.V.:3198.5; IV DPOEUMPNT:6144] Out: 3154 [MGQQP:6195] No intake/output data recorded.  SpO2: 99 %   Review of Systems:  Gen:  Denies  fever, sweats, chills weigh loss  HEENT: Denies blurred vision, double vision, ear pain, eye pain, hearing loss, nose bleeds, sore throat Cardiac:  No dizziness, chest pain or heaviness, chest tightness,edema, No JVD Resp:   No cough, -sputum production, -shortness of breath,-wheezing, -hemoptysis,  Other:  All other systems negative  Physical Examination:   GENERAL:NAD, no fevers, chills, no weakness no fatigue HEAD: Normocephalic, atraumatic.  EYES: PERLA, EOMI No scleral icterus.  NECK: Supple.  PULMONARY: CTA B/L no wheezing, rhonchi, crackles CARDIOVASCULAR: S1 and S2. Regular rate and rhythm. No murmurs GASTROINTESTINAL: Soft, nontender, nondistended. Positive bowel sounds.  MUSCULOSKELETAL: No swelling, clubbing, or edema.  NEUROLOGIC: 0/5 strength in LE, dysreflexia RUE, LUE in arm binder PSYCHIATRIC: Insight, judgment intact. -depression -anxiety ALL OTHER ROS ARE NEGATIVE    I personally reviewed Labs under Results section.   MEDICATIONS: I have reviewed all medications and confirmed regimen as documented   CULTURE RESULTS   Recent Results (from the past 240 hour(s))  SARS CORONAVIRUS 2 (TAT 6-24 HRS) Nasopharyngeal Nasopharyngeal Swab     Status: None   Collection Time: 10/23/19 10:08 AM   Specimen: Nasopharyngeal Swab  Result Value Ref Range Status   SARS Coronavirus 2 NEGATIVE NEGATIVE Final    Comment: (NOTE) SARS-CoV-2 target nucleic acids are NOT DETECTED. The SARS-CoV-2 RNA is generally  detectable in upper and lower respiratory specimens during the acute phase of infection. Negative results do not preclude SARS-CoV-2 infection, do not rule out co-infections with other pathogens, and should not be used as the sole basis for treatment or other patient management decisions. Negative results must be combined with clinical observations, patient history, and epidemiological information. The expected result is Negative. Fact Sheet for Patients: SugarRoll.be Fact Sheet for Healthcare Providers: https://www.woods-mathews.com/ This test is not yet approved or cleared by the [place] FDA and  has been authorized for detection and/or diagnosis of SARS-CoV-2 by FDA under an Emergency Use Authorization (EUA). This EUA will remain  in effect (meaning this test can be used) for the duration of the COVID-19 declaration under Section 56 4(b)(1) of the Act, 21 U.S.C. section 360bbb-3(b)(1), unless the authorization is terminated or revoked sooner. Performed at [facility], [place] [address], [place], [place] [address]   Urine culture     Status: Abnormal   Collection Time: 10/26/19  2:39 AM   Specimen: Urine, Random  Result Value Ref Range Status   Specimen Description   Final    URINE, RANDOM Performed at [facility], [address], [place], [place] [address]    Special Requests   Final    NONE Performed at [facility], [address] [place], [place] [address]    Culture (A)  Final    <10,000 COLONIES/mL INSIGNIFICANT GROWTH Performed at [facility], [place] [address] [place], [place] [address]    Report Status 10/26/2019  FINAL  Final  SARS CORONAVIRUS 2 (TAT 6-24 HRS) Nasopharyngeal Nasopharyngeal Swab     Status: None   Collection Time: 10/26/19  2:39 AM   Specimen: Nasopharyngeal Swab  Result Value Ref Range Status   SARS Coronavirus 2 NEGATIVE NEGATIVE Final    Comment: (NOTE) SARS-CoV-2  target nucleic acids are NOT DETECTED. The SARS-CoV-2 RNA is generally detectable in upper and lower respiratory specimens during the acute phase of infection. Negative results do not preclude SARS-CoV-2 infection, do not rule out co-infections with other pathogens, and should not be used as the sole basis for treatment or other patient management decisions. Negative results must be combined with clinical observations, patient history, and epidemiological information. The expected result is Negative. Fact Sheet for Patients: SugarRoll.be Fact Sheet for Healthcare Providers: https://www.woods-mathews.com/ This test is not yet approved or cleared by the [place] FDA and  has been authorized for detection and/or diagnosis of SARS-CoV-2 by FDA under an Emergency Use Authorization (EUA). This EUA will remain  in effect (meaning this test can be used) for the duration of the COVID-19 declaration under Section 56 4(b)(1) of the Act, 21 U.S.C. section 360bbb-3(b)(1), unless the authorization is terminated or revoked sooner. Performed at [facility], [place] [address] [place], [place] [address]   CULTURE, BLOOD (ROUTINE X 2) w Reflex to ID Panel     Status: None (Preliminary result)   Collection Time: 10/26/19  8:52 AM   Specimen: BLOOD  Result Value Ref Range Status   Specimen Description BLOOD RIGHT AC  Final   Special Requests   Final    BOTTLES DRAWN AEROBIC AND ANAEROBIC Blood Culture results may not be optimal due to an excessive volume of blood received in culture bottles   Culture   Final    NO GROWTH < 24 HOURS Performed at [facility], [address], [place], [place] [address]    Report Status PENDING  Incomplete  CULTURE, BLOOD (ROUTINE X 2) w Reflex to ID Panel     Status: None (Preliminary result)   Collection Time: 10/26/19  8:52 AM   Specimen: BLOOD  Result Value Ref Range Status   Specimen Description BLOOD  LEFT HAND  Final   Special Requests   Final    BOTTLES DRAWN AEROBIC AND ANAEROBIC Blood Culture adequate volume   Culture   Final    NO GROWTH < 24 HOURS Performed at [facility], [address] [place], [place] [address]    Report Status PENDING  Incomplete  MRSA PCR Screening     Status: None   Collection Time: 10/26/19  2:28 PM   Specimen: Nasal Mucosa; Nasopharyngeal  Result Value Ref Range Status   MRSA by PCR NEGATIVE NEGATIVE Final    Comment:        The GeneXpert MRSA Assay (FDA approved for NASAL specimens only), is one component of a comprehensive MRSA colonization surveillance program. It is not intended to diagnose MRSA infection nor to guide or monitor treatment for MRSA infections. Performed at [facility], [place], [place] [address]           IMAGING    Ct Head Wo Contrast  Result Date: 10/26/2019 CLINICAL DATA:  Altered level of consciousness EXAM: CT HEAD WITHOUT CONTRAST TECHNIQUE: Contiguous axial images were obtained from the base of the skull through the vertex without intravenous contrast. COMPARISON:  10/27/2010 FINDINGS: Brain: There is a hypodense lesion of the left cerebellar hemisphere (  series 2, image 8). Vascular: No hyperdense vessel or unexpected calcification. Skull: Normal. Negative for fracture or focal lesion. Sinuses/Orbits: No acute finding. Other: None. IMPRESSION: There is a hypodense lesion of the left cerebellar hemisphere (series 2, image 8), new compared to remote prior examination dated 10/27/2010 and suspicious for acute to subacute infarction. MRI may be used to further assess for acute diffusion restricting infarction. Electronically Signed   By: Eddie Candle M.D.   On: 10/26/2019 14:52   Mr Brain Wo Contrast  Addendum Date: 10/26/2019   ADDENDUM REPORT: 10/26/2019 23:40 ADDENDUM: Study discussed by telephone with Dr. Silas [place] on 10/26/2019 at 2334 hours. Electronically Signed   By: Genevie Ann M.D.   On: 10/26/2019 23:40   Result Date: 10/26/2019 CLINICAL DATA:  48 year old male with altered level of consciousness. Ataxia. Abnormal hypodensity in the left cerebellum on head CT earlier today. EXAM: MRI HEAD WITHOUT CONTRAST TECHNIQUE: Multiplanar, multiecho pulse sequences of the brain and surrounding structures were obtained without intravenous contrast. COMPARISON:  Head CT 1438 hours today. FINDINGS: Brain: Confluent 2.5 centimeter area of restricted diffusion in the left cerebellar hemisphere corresponding to the CT hypodensity earlier today. Associated T2 and FLAIR hyperintensity, and possible mild petechial hemorrhage (series 8, image 12), but no mass effect. Additionally, there are much smaller scattered areas of restricted diffusion in the right cerebellar hemisphere (series 2, image 13). No brainstem restricted diffusion. However, there is also a small but confluent area of restricted diffusion in the left cingulate gyrus (series 2, image 33). T2 and FLAIR hyperintensity in those areas with no associated hemorrhage or mass effect. Underlying cerebral volume is normal. And outside of the acute areas gray and white matter signal appears within normal limits. No chronic cortical encephalomalacia or blood products. No midline shift, mass effect, evidence of mass lesion, ventriculomegaly, extra-axial collection. Cervicomedullary junction and pituitary are within normal limits. Deep gray matter nuclei appear normal. Vascular: Major intracranial vascular flow voids are preserved. Skull and upper cervical spine: Negative visible cervical spine. Visualized bone marrow signal is within normal limits. Sinuses/Orbits: Negative orbits. Trace paranasal sinus mucosal thickening. Other: Trace retained secretions in the nasopharynx. Mastoids are clear. Visible internal auditory structures appear normal. Scalp and face soft tissues appear negative. IMPRESSION: 1. Acute infarct in the Left Cerebellar  hemisphere corresponding to the CT abnormality earlier today, but there are also small acute infarcts in the Right Cerebellum and the Left ACA territory. This pattern of anterior and posterior circulation ischemia raises the possibility of a recent embolic event. 2. Possible petechial hemorrhage in the dominant left cerebellar infarct, but no malignant hemorrhagic transformation and no intracranial mass effect. 3. Normal background MRI appearance of the brain. Electronically Signed: By: Genevie Ann M.D. On: 10/26/2019 23:22        ASSESSMENT AND PLAN SYNOPSIS Acute unresponsiveness due to vasovagal syncope due to autonomic dysfunction secondary to underlying quadriplegia in the setting of severe DKA Patient is a DNR/DNI  1.  Orthostatic hypotension had trials of Florinef and midodrine but did not follow-up in clinic -Recent admission for surgery on his shoulder for rotator cuff tear Unable to work with PT given orthostatic hypotension with sitting up -Florinef 3 times daily as needed for low blood pressure -midodrine 5 mg 3 times daily  2.  Demand ischemia Echo pending Not a good candidate for beta-blockers, ACE inhibitors ARB's etc. given orthostasis  3.  Stroke Seen on CT scan, timing unclear given no recent studies for comparison May be  unrelated to current presentation  4.  DKA Insulin drip protocol as tolerated,needed  5.ACUTE CVA Follow up neuro recs  Corrin Parker, M.D.  [facility]  Medical Director [place] Director [facility] Cardio-Pulmonary Department

## 2019-10-27 NOTE — Progress Notes (Signed)
eeg completed ° °

## 2019-10-27 NOTE — Progress Notes (Signed)
Entire shift patient refused to be turned every 2 hours. Did request frequently, every 30 minutes to 1 hour to be repositioned. Such as spreading his buttock cheeks, pulling entire body from side to side, repositioning pillow under head up and down, straighten arms, bend arms, straighten legs, cover and uncover arms and legs and shoulders.  Multiple bed position changes throughout shift to closely monitor BP for postural hypotension. Blood pressure remained stable as far as dropping but hypertensive majority of shift.

## 2019-10-27 NOTE — Progress Notes (Signed)
[facility] Progress Note  Patient: [name] XBL:[id number]   PCP: Einar Pheasant, MD DOB: 28-Feb-1971   DOA: 10/26/2019   DOS: 10/27/2019   Date of Service: the patient was seen and examined on 10/27/2019  Chief Complaint  Patient presents with  . Hyperglycemia   Brief hospital course: Shylo Dillenbeck Barsamian is a 48 y.o. 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  Most of the history was obtained from ER physician.  Not able to reach patient's family.  Patient did not have any nausea vomiting diarrhea chest pain or shortness of breath fever or chills. After admission patient remained confused for the past week. His respiratory condition worsened as well requiring CCM evaluation. Mentation improved rapidly.  CT head and MRI brain shows possibility of acute stroke.  Neurology, CCM and cardiology consulted.  Currently further plan is continue further stroke work-up.  Subjective: Reports muscle aches as well as bilateral shoulder pain.  No nausea no vomiting no fever no chills.  No chest pain abdominal pain.  No acute events overnight.  Assessment and Plan: Scheduled Meds: .  stroke: mapping our early stages of recovery book   Does not apply Once  . aspirin  300 mg Rectal Daily   Or  . aspirin  325 mg Oral Daily  . Chlorhexidine Gluconate Cloth  6 each Topical Daily  . enoxaparin (LOVENOX) injection  40 mg Subcutaneous Q24H  . insulin aspart  0-9 Units Subcutaneous Q4H  . insulin detemir  15 Units Subcutaneous Q24H   Continuous Infusions: . cefTRIAXone (ROCEPHIN)  IV 1 g (10/27/19 0650)  . dextrose 5 % and 0.45% NaCl 75 mL/hr at 10/27/19 1209  . potassium chloride 10 mEq (10/27/19 1138)   PRN Meds: acetaminophen, baclofen, dextrose, fludrocortisone,  HYDROcodone-acetaminophen, naphazoline-glycerin   Diabetic ketoacidosis in type I diabetic Precipitating cause not clear.   Patient was given fluid bolus in the ER Treated with IV insulin EndoTool  Anion gap closed yesterday. Recommendations to Lantus with every 4 hour regimen. Given patient's poor p.o. intake clinically undershooting with Lantus. Once oral intake improves we will increase the dose of the Lantus.  Elevated troponin. Patient's troponin continues to trend up. Cardiology consulted. Suspect patient's hypotension as well as bradycardia is likely secondary to autonomic dysfunction. Started on midodrine but Echocardiogram currently pending. Patient does not have any chest pain or EKG changes to suggest any ischemic event.  Acute CVA. Acute metabolic encephalopathy Neurology consulted for further assistance. Patient was unresponsive yesterday to sternal rub. Pupils were nonreactive. CT scan of the head did show possibility of an acute infarct although unable to explain completely to yesterday.  With treatment EEG proved to show any evidence of epileptogenic foci. MRI brain also shows evidence of acute stroke but no other acute abnormality. Neurology consulted. completing stroke work-up.  Anemia hemoglobin appears to have worsened from previous.  Could be from postoperative blood loss.  Recent left shoulder surgery for rotator cuff.   History of paraplegia with autonomic dysreflexia takes fludrocortisone as needed.  Patient was initially hypotensive improved with fluids.  Does not look septic however will place patient on ceftriaxone for possible UTI until we get cultures.  Pressure Injury 10/19/19 Coccyx Unstageable - Full thickness tissue loss in which the base of the ulcer is covered by slough (  yellow, tan, gray, green or brown) and/or eschar (tan, brown or black) in the wound bed. (Active)  10/19/19 1730  Location: Coccyx  Location Orientation:   Staging:  Unstageable - Full thickness tissue loss in which the base of the ulcer is covered by slough (yellow, tan, gray, green or brown) and/or eschar (tan, brown or black) in the wound bed.  Wound Description (Comments):   Present on Admission: Yes     Diet: Cardiac diet and Carb modified diet  DVT Prophylaxis: Subcutaneous Lovenox   Advance goals of care discussion: DNR  Family Communication: no family was present at bedside, at the time of interview.   Disposition:  Discharge to be determined .  Consultants: PCCM, neurology, cardiology Procedures: EEG, echocardiogram  Antibiotics: Anti-infectives (From admission, onward)   Start     Dose/Rate Route Frequency Ordered Stop   10/26/19 0630  cefTRIAXone (ROCEPHIN) 1 g in sodium chloride 0.9 % 100 mL IVPB     1 g 200 mL/hr over 30 Minutes Intravenous Every 24 hours 10/26/19 0622 10/31/19 0629       Objective: Physical Exam: Vitals:   10/27/19 0903 10/27/19 1000 10/27/19 1100 10/27/19 1200  BP:  132/81 124/89 (!) 167/116  Pulse: 78 70 69 81  Resp: 15 14 10 14   Temp: 98.6 F (37 C)     TempSrc: Oral     SpO2: 99% 96% 96% 98%  Weight:      Height:        Intake/Output Summary (Last 24 hours) at 10/27/2019 1331 Last data filed at 10/27/2019 0600 Gross per 24 hour  Intake 3231.27 ml  Output 1000 ml  Net 2231.27 ml   Filed Weights   10/26/19 0116 10/26/19 1048  Weight: 79.4 kg 75.5 kg   General: alert and oriented to time, place, and person. Appear in mild distress, affect appropriate Eyes: PERRL, Conjunctiva normal ENT: Oral Mucosa Clear, moist  Neck: difficult to assess  JVD, no Abnormal Mass Or lumps Cardiovascular: S1 and S2 Present, no Murmur,  Respiratory: good respiratory effort, Bilateral Air entry equal and Decreased, no signs of accessory muscle use, Clear to Auscultation, no Crackles, no wheezes Abdomen: Bowel Sound present, Soft and difficult to assess  tenderness, no hernia Skin: no rashes  Extremities: no  Pedal edema, no calf tenderness Neurologic: paraparesis of legs noted Gait not checked due to patient safety concerns  Data Reviewed: I have personally reviewed and interpreted daily labs, tele strips, imagings as discussed above. I reviewed all nursing notes, pharmacy notes, vitals, pertinent old records I have discussed plan of care as described above with RN and patient/family.  CBC: Recent Labs  Lab 10/26/19 0119 10/27/19 0613  WBC 6.5 7.1  NEUTROABS  --  5.8  HGB 10.5* 10.1*  HCT 31.8* 29.1*  MCV 97.5 93.0  PLT 188 517   Basic Metabolic Panel: Recent Labs  Lab 10/26/19 0119  10/26/19 1045 10/26/19 1419 10/26/19 1901 10/26/19 2219 10/27/19 0613  NA 126*   < > 131* 131* 133* 132* 134*  K 5.2*   < > 3.5 3.7 3.4* 3.2* 3.3*  CL 90*   < > 99 100 103 102 102  CO2 11*   < > 19* 20* 21* 21* 23  GLUCOSE 536*   < > 225* 240* 201* 167* 114*  BUN 31*   < > 30* 27* 23* 21* 17  CREATININE 1.56*   < > 1.14 0.95 0.83 0.65 0.59*  CALCIUM 8.4*   < >  8.1* 8.0* 8.3* 8.2* 8.2*  MG 1.5*  --   --   --   --   --  1.7  PHOS 5.1*  --   --   --   --   --  3.1   < > = values in this interval not displayed.    Liver Function Tests: Recent Labs  Lab 10/26/19 0852 10/27/19 0613  AST 29 25  ALT 20 17  ALKPHOS 87 68  BILITOT 1.4* 0.6  PROT 6.6 5.5*  ALBUMIN 3.7 3.0*   No results for input(s): LIPASE, AMYLASE in the last 168 hours. Recent Labs  Lab 10/26/19 0852  AMMONIA 16   Coagulation Profile: No results for input(s): INR, PROTIME in the last 168 hours. Cardiac Enzymes: No results for input(s): CKTOTAL, CKMB, CKMBINDEX, TROPONINI in the last 168 hours. BNP (last 3 results) No results for input(s): PROBNP in the last 8760 hours. CBG: Recent Labs  Lab 10/26/19 2340 10/27/19 0338 10/27/19 0800 10/27/19 1130 10/27/19 1228  GLUCAP 116* 122* 84 35* 129*   Studies: Ct Head Wo Contrast  Result Date: 10/26/2019 CLINICAL DATA:  Altered level of consciousness EXAM: CT HEAD  WITHOUT CONTRAST TECHNIQUE: Contiguous axial images were obtained from the base of the skull through the vertex without intravenous contrast. COMPARISON:  10/27/2010 FINDINGS: Brain: There is a hypodense lesion of the left cerebellar hemisphere (series 2, image 8). Vascular: No hyperdense vessel or unexpected calcification. Skull: Normal. Negative for fracture or focal lesion. Sinuses/Orbits: No acute finding. Other: None. IMPRESSION: There is a hypodense lesion of the left cerebellar hemisphere (series 2, image 8), new compared to remote prior examination dated 10/27/2010 and suspicious for acute to subacute infarction. MRI may be used to further assess for acute diffusion restricting infarction. Electronically Signed   By: Eddie Candle M.D.   On: 10/26/2019 14:52   Mr Brain Wo Contrast  Addendum Date: 10/26/2019   ADDENDUM REPORT: 10/26/2019 23:40 ADDENDUM: Study discussed by telephone with Dr. Silas [place] on 10/26/2019 at 2334 hours. Electronically Signed   By: Genevie Ann M.D.   On: 10/26/2019 23:40   Result Date: 10/26/2019 CLINICAL DATA:  48 year old male with altered level of consciousness. Ataxia. Abnormal hypodensity in the left cerebellum on head CT earlier today. EXAM: MRI HEAD WITHOUT CONTRAST TECHNIQUE: Multiplanar, multiecho pulse sequences of the brain and surrounding structures were obtained without intravenous contrast. COMPARISON:  Head CT 1438 hours today. FINDINGS: Brain: Confluent 2.5 centimeter area of restricted diffusion in the left cerebellar hemisphere corresponding to the CT hypodensity earlier today. Associated T2 and FLAIR hyperintensity, and possible mild petechial hemorrhage (series 8, image 12), but no mass effect. Additionally, there are much smaller scattered areas of restricted diffusion in the right cerebellar hemisphere (series 2, image 13). No brainstem restricted diffusion. However, there is also a small but confluent area of restricted diffusion in the left cingulate gyrus  (series 2, image 33). T2 and FLAIR hyperintensity in those areas with no associated hemorrhage or mass effect. Underlying cerebral volume is normal. And outside of the acute areas gray and white matter signal appears within normal limits. No chronic cortical encephalomalacia or blood products. No midline shift, mass effect, evidence of mass lesion, ventriculomegaly, extra-axial collection. Cervicomedullary junction and pituitary are within normal limits. Deep gray matter nuclei appear normal. Vascular: Major intracranial vascular flow voids are preserved. Skull and upper cervical spine: Negative visible cervical spine. Visualized bone marrow signal is within normal limits. Sinuses/Orbits: Negative orbits. Trace paranasal sinus mucosal thickening.  Other: Trace retained secretions in the nasopharynx. Mastoids are clear. Visible internal auditory structures appear normal. Scalp and face soft tissues appear negative. IMPRESSION: 1. 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 This pattern of anterior and posterior circulation ischemia raises the possibility of a recent embolic event. 2. Possible petechial hemorrhage in the dominant left cerebellar infarct, but no malignant hemorrhagic transformation and no intracranial mass effect. 3. Normal background MRI appearance of the brain. Electronically Signed: By: Genevie Ann M.D. On: 10/26/2019 23:22   US Venous Img Lower Bilateral (dvt)  Result Date: 10/27/2019 CLINICAL DATA:  Bilateral lower extremity edema. 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 Evaluate for DVT. EXAM: BILATERAL LOWER EXTREMITY VENOUS DOPPLER ULTRASOUND TECHNIQUE: Gray-scale sonography with graded compression, as well as color Doppler and duplex ultrasound were performed to evaluate the lower extremity deep venous systems from the level of the  common femoral vein and including the common femoral, femoral, profunda femoral, popliteal and calf veins including the posterior tibial, peroneal and gastrocnemius veins when visible. The superficial great saphenous vein was also interrogated. Spectral Doppler was utilized to evaluate flow at rest and with distal augmentation maneuvers in the common femoral, femoral and popliteal veins. COMPARISON:  None. FINDINGS: RIGHT LOWER EXTREMITY Common Femoral Vein: No evidence of thrombus. Normal compressibility, respiratory phasicity and response to augmentation. Saphenofemoral Junction: No evidence of thrombus. Normal compressibility and flow on color Doppler imaging. Profunda Femoral Vein: No evidence of thrombus. Normal compressibility and flow on color Doppler imaging. Femoral Vein: No evidence of thrombus. Normal compressibility, respiratory phasicity and response to augmentation. Popliteal Vein: No evidence of thrombus. Normal compressibility, respiratory phasicity and response to augmentation. Calf Veins: No evidence of thrombus. Normal compressibility and flow on color Doppler imaging. Superficial Great Saphenous Vein: No evidence of thrombus. Normal compressibility. Venous Reflux:  None. Other Findings:  None. LEFT LOWER EXTREMITY Common Femoral Vein: No evidence of thrombus. Normal compressibility, respiratory phasicity and response to augmentation. Saphenofemoral Junction: No evidence of thrombus. Normal compressibility and flow on color Doppler imaging. Profunda Femoral Vein: No evidence of thrombus. Normal compressibility and flow on color Doppler imaging. Femoral Vein: No evidence of thrombus. Normal compressibility, respiratory phasicity and response to augmentation. Popliteal Vein: No evidence of thrombus. Normal compressibility, respiratory phasicity and response to augmentation. Calf Veins: Appear patent where imaged. Superficial Great Saphenous Vein: No evidence of thrombus. Normal compressibility.  Venous Reflux:  None. Other Findings:  None. IMPRESSION: No evidence of DVT within either lower extremity. Electronically Signed   By: Sandi Mariscal M.D.   On: 10/27/2019 12:07     Time spent: 35 minutes  Author: Berle Mull, MD [facility] 10/27/2019 1:31 PM  To reach On-call, see care teams to locate the attending and reach out to them via [email or web address]. If 7PM-7AM, please contact night-coverage If you still have difficulty reaching the attending provider, please page the [facility] (Director on Call) for [facility] on amion for assistance.

## 2019-10-27 NOTE — Progress Notes (Signed)
Extensive conversation with patient about managing his blood sugars with insulin. Patient very adamant about managing his own blood sugars. Manages his blood sugars at home based on sliding scale and carb coverage per meal. Routinely checks his blood sugar levels multiple times during the day at home and administers insulin based on levels.  Conversation relayed to MD Kasa. At this time plan was to continue with sliding scale as per patient requested dose and to monitor blood sugar levels per patient request and to call for one time dose orders as patient requests for carb coverage.  1800 Update: Patient adamantly requesting 18 units of insulin to cover his carbs from dinner. Spoke to patient about this large amount of insulin and the danger of hypoglycemia as earlier in the shift he experienced one of these episodes. Even after discussion patient adamant and getting very irritated as he was admitted for DKA from a facility that would not allow him to manage his own blood sugars. Verbal order given from MD Kasa for insulin per patients request. MD Kasa stated to delay transfer to medical floor at this time to ensure blood sugar does not drop.  Updates provided to oncoming night shift nurse as well as day and night shift nurse on medical floor.
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
## 2019-10-27 NOTE — Procedures (Addendum)
ELECTROENCEPHALOGRAM REPORT   Patient: [name]       Room #: IC10A-AA EEG No. ID: [id number] Age: 48 y.o.        Sex: male Referring Physician: Kasa Report Date:  10/27/2019        Interpreting Physician: Alexis Goodell  History: [name] is an 48 y.o. male with an episode of unresponsiveness  Medications:  ASA, Rocephin, Insulin  Conditions of Recording:  This is a 21 channel routine scalp EEG performed with bipolar and monopolar montages arranged in accordance to the international 10/20 system of electrode placement. One channel was dedicated to EKG recording.  The patient is in the awake and drowsy states.  Description:  The waking background activity consists of a low voltage, symmetrical, fairly well organized, 7 Hz theta activity, seen from the parieto-occipital and posterior temporal regions.  Low voltage fast activity, poorly organized, is seen anteriorly and is at times superimposed on more posterior regions.  A mixture of theta and alpha rhythms are seen from the central and temporal regions. The patient drowses with slowing to irregular, low voltage theta and beta activity.   Stage II sleep is not obtained. No epileptiform activity is noted.   Hyperventilation and intermittent photic stimulation were not performed.   IMPRESSION: This is an abnormal EEG secondary to mild posterior background slowing.  This finding may be seen with a diffuse gray matter disturbance that is etiologically nonspecific, but may include a dementia or static encephalopathy, among other possibilities.  No epileptiform activity is noted.     Alexis Goodell, MD Neurology [phone] 10/27/2019, 12:48 PM

## 2019-10-27 NOTE — Consult Note (Addendum)
WOC Nurse Consult Note: Reason for Consult:pressure injury Wound type: Stage 2 Pressure Injury Pressure Injury POA: Yes Measurement: see nursing flow sheet Wound bed: reported by patient as calloused area that he cares for independently  Drainage (amount, consistency, odor) scant Periwound: intact  Dressing procedure/placement/frequency: Continue silicone foam per skin care order set  Discussed POC with patient and bedside nurse.  Re consult if needed, will not follow at this time. Thanks  Tauno Falotico [employer], RN,CWOCN, CNS, CWON-AP [phone])  Addendum: bedside nurse contacted me wound has some malodorous drainage, will add silver hydrofiber to address this.  Not necrotic  [place], [place], [place]

## 2019-10-27 NOTE — Progress Notes (Signed)
NIHSS and modified NIHSS incomplete assessments due to paraplegia, paralyzed from nipple line down. Left arm immobile due to rotator cuff repair on 10/19/2019. Assessment complete as best as could be done due to chronic and acute medical conditions.
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
## 2019-10-27 NOTE — Progress Notes (Signed)
Multiple discussions between cardiology and MD Kasa about patients fluctuating blood pressure. No orders placed to lower BP as patient suffers from postural hypotension from autonomic dysregulation. Midodrine ordered yesterday to help with postural hypotension discontinued to help with hypertension. Patient with no complaints throughout shift regarding blood pressure regulation. Reported feeling better with positional changes compared to in the past.

## 2019-10-27 NOTE — Evaluation (Signed)
Occupational Therapy Evaluation Patient Details Name: [name] MRN: [id number] DOB: [birth year] Today's Date: 10/27/2019    History of Present Illness Per MD Notes: [name] is a 48 y.o. male who presented to the ED from his skilled nursing facility after he was found to be having elevated blood sugar with more than 500 and AMS. MRI of the brain performed and shows acute infarcts on the left and right cerebellar hemispheres and the left ACA territory.  Embolic etiology likely.  Not a tPA candidate due to unclear LKW.  Echocardiogram and carotid dopplers are pending.  Pt is s/p Left rotator cuff repair (10/19/19).  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 Additional PMH includes Type 1 diabeties, Autonomic dysreflexia,   Clinical Impression   [name] was seen for OT/PT co-evaluation/treatment this date. Prior to hospital admission, pt was receiving care at a skilled nursing facility while recovering from his recent rotator cuff repair.  At baseline, pt lives alone in a 1 level apartment with a ramped entrance.  He is modified independent with ADL/IADL management and uses a motorized scooter/wheelchair for mobility. Currently pt demonstrates impairments in strength, sensation, activity tolerance, and functional BUE use requiring max-total assist for BADL and mobility.  Pt would benefit from skilled OT to address noted impairments and functional limitations (see below for any additional details) in order to maximize safety and independence while minimizing falls risk and caregiver burden.  Upon hospital discharge, recommend pt discharge to STR to maximize pt safety and return to PLOF.      Follow Up Recommendations  SNF    Equipment Recommendations  Other (comment)(TBD at next venue of care.)    Recommendations for Other Services       Precautions / Restrictions Precautions Precautions: Shoulder;Fall Type of  Shoulder Precautions: Awaiting orthopedic consult at time of OT/PT evaluation. LUE treated as NWB with no AROM, AAROM, or PROM performed during assessment. Pt with white sling/immobilizer applied in the ED in place at start/end of session. Restrictions Weight Bearing Restrictions: Yes LUE Weight Bearing: Non weight bearing      Mobility Bed Mobility Overal bed mobility: Needs Assistance             General bed mobility comments: Pt max assist +2 for repositioning legs, hips, and torso in bed. Pt limited by LUE immobilization this date.  Transfers                 General transfer comment: Deferred.    Balance Overall balance assessment: Needs assistance Sitting-balance support: Single extremity supported                                       ADL either performed or assessed with clinical judgement   ADL Overall ADL's : Needs assistance/impaired Eating/Feeding: Modified independent   Grooming: Minimal assistance   Upper Body Bathing: Maximal assistance   Lower Body Bathing: Maximal assistance   Upper Body Dressing : Maximal assistance   Lower Body Dressing: Maximal assistance   Toilet Transfer: Total assistance Toilet Transfer Details (indicate cue type and reason): hoyer lift Toileting- Clothing Manipulation and Hygiene: Moderate assistance;Maximal assistance               Vision Patient Visual Report: No change from baseline Additional Comments: Pt denies visual changes since admission. Will continue to assess  in a functional context.     Perception     Praxis      Pertinent Vitals/Pain Pain Assessment: Faces Faces Pain Scale: Hurts little more Pain Location: Buttocks/sacrum; L shoulder Pain Descriptors / Indicators: Sore;Guarding;Grimacing Pain Intervention(s): Limited activity within patient's tolerance;Monitored during session;Premedicated before session;Repositioned     Hand Dominance (Pt reports uses BUE consistently  since his SCI.)   Extremity/Trunk Assessment Upper Extremity Assessment Upper Extremity Assessment: RUE deficits/detail RUE Deficits / Details: limited sensation at the brachium; gross and fine motor impairments of digits 1 and 2; baseline gross use of RUE for WC propulsion and self care. Pt reports strength and coordination are at baseline, however since recent strokes does feel "pins and needle tingling" in his RUE. LUE Deficits / Details: full recovery following SCI but now very limited 2/2 shoulder surgery and ROM restrictions. LUE: Unable to fully assess due to pain;Unable to fully assess due to immobilization   Lower Extremity Assessment Lower Extremity Assessment: Defer to PT evaluation(Pt endorses increased spasm activity in BLE.)   Cervical / Trunk Assessment Cervical / Trunk Assessment: (No strength/activation below "armpit")   Communication Communication Communication: No difficulties   Cognition Arousal/Alertness: Awake/alert Behavior During Therapy: WFL for tasks assessed/performed;Agitated;Anxious Overall Cognitive Status: Within Functional Limits for tasks assessed                                 General Comments: Pt at times seeming agitated/anxious over upcoming ultrasound of his shoulder. Concerned hospital staff would skip over his room if they saw OT/PT in working with him. Regularly asks OT/PT to check with staff outside his door to ensure they are not there for his ultrasound.   General Comments  Pt BP readings monitored t/o session as sequentially as follows: Supine 148/131, 166/115, 158/113; Semi-reclined: 182/99, 157/99, 165/92; Bed in partial chair mode with head elevated and feet lowered: 180/78, 180/14; Pt returned to semi-supine with HOB ~30 degrees and legs flat at end of session: 187/108, 174/103. Pt left with condom catheter in place, all lines and leads intact from start of session, and prevlon boots donned.    Exercises Other Exercises Other  Exercises: OT/PT engage pt in attempted positional change with bed in chair position. Further mobility deferred at this time while awaiting orthopedic consult for shoulder precautions. OT educates pt on safety precautions for monitoring blood pressure with mobility and maintaining safe positioning of his LUE. Other Exercises: OT/PT assist pt with repositioning in bed to maximize safety, skin integrity and comfort this date.   Shoulder Instructions      Home Living Family/patient expects to be discharged to:: Skilled nursing facility Living Arrangements: Alone   Type of Home: House Home Access: Ramped entrance;Elevator     Home Layout: One level     Bathroom Shower/Tub: Tub/shower unit         Home Equipment: Wheelchair - manual;Shower seat;Other (comment)   Additional Comments: HOB/FOB tilt; electric scooter attachment for WC; slideboard      Prior Functioning/Environment Level of Independence: Independent with assistive device(s)  Gait / Transfers Assistance Needed: lateral scoot transfers or slide board transfers [facility] to/from bed/car ADL's / [place] Needed: has well established modified independent self care/ADL routines 2/2 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            OT Problem List: Decreased strength;Decreased coordination;Decreased range of motion;Impaired sensation;Impaired balance (sitting and/or standing);Decreased knowledge of  use of DME or AE;Impaired UE functional use      OT Treatment/Interventions: Self-care/ADL training;Therapeutic exercise;Therapeutic activities;DME and/or AE instruction;Patient/family education;Balance training;Neuromuscular education    OT Goals(Current goals can be found in the care plan section) Acute Rehab OT Goals Patient Stated Goal: To finish rehab and get stronger OT Goal Formulation: With patient Time For Goal Achievement: 11/10/19 Potential to Achieve Goals: Good ADL Goals Pt Will Perform  Grooming: sitting;with min assist Pt Will Perform Upper Body Dressing: sitting;with min assist  OT Frequency: Min 1X/week   Barriers to D/C: Decreased caregiver support          Co-evaluation PT/OT/SLP Co-Evaluation/Treatment: Yes Reason for Co-Treatment: Complexity of the patient's impairments (multi-system involvement);For patient/therapist safety PT goals addressed during session: Mobility/safety with mobility;Strengthening/ROM OT goals addressed during session: ADL's and self-care      AM-PAC OT "6 Clicks" Daily Activity     Outcome Measure Help from another person eating meals?: None Help from another person taking care of personal grooming?: A Little Help from another person toileting, which includes using toliet, bedpan, or urinal?: A Lot Help from another person bathing (including washing, rinsing, drying)?: A Lot Help from another person to put on and taking off regular upper body clothing?: A Lot Help from another person to put on and taking off regular lower body clothing?: A Lot 6 Click Score: 15   End of Session    Activity Tolerance: Patient tolerated treatment well Patient left: in bed;with call bell/phone within reach;with bed alarm set;with nursing/sitter in room  OT Visit Diagnosis: Other abnormalities of gait and mobility (R26.89);Other symptoms and signs involving the nervous system (R29.898)                Time: 1660-6004 OT Time Calculation (min): 49 min Charges:  OT General Charges $OT Visit: 1 Visit OT Evaluation $OT Eval Moderate Complexity: 1 Mod OT Treatments $Self Care/Home Management : 8-22 mins  Shara Blazing, M.S., OTR/L Ascom: [phone] 10/27/19, 4:19 PM
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
## 2019-10-28 ENCOUNTER — Inpatient Hospital Stay: Payer: Medicare Other

## 2019-10-28 DIAGNOSIS — I639 Cerebral infarction, unspecified: Secondary | ICD-10-CM

## 2019-10-28 DIAGNOSIS — I959 Hypotension, unspecified: Secondary | ICD-10-CM

## 2019-10-28 DIAGNOSIS — E1010 Type 1 diabetes mellitus with ketoacidosis without coma: Principal | ICD-10-CM

## 2019-10-28 LAB — LIPID PANEL
Cholesterol: 132 mg/dL (ref 0–200)
HDL: 52 mg/dL (ref >40)
LDL Cholesterol: 65 mg/dL (ref 0–99)
Total CHOL/HDL Ratio: 2.5 RATIO
Triglycerides: 73 mg/dL (ref <150)
VLDL: 15 mg/dL (ref 0–40)

## 2019-10-28 LAB — HOMOCYSTEINE: Homocysteine: 15.1 umol/L — ABNORMAL HIGH (ref 0.0–14.5)

## 2019-10-28 LAB — PROTEIN S, TOTAL: Protein S Ag, Total: 79 % (ref 60–150)

## 2019-10-28 LAB — GLUCOSE, CAPILLARY
Glucose-Capillary: 79 mg/dL (ref 70–99)
Glucose-Capillary: 177 mg/dL — ABNORMAL HIGH (ref 70–99)
Glucose-Capillary: 206 mg/dL — ABNORMAL HIGH (ref 70–99)
Glucose-Capillary: 163 mg/dL — ABNORMAL HIGH (ref 70–99)
Glucose-Capillary: 217 mg/dL — ABNORMAL HIGH (ref 70–99)
Glucose-Capillary: 122 mg/dL — ABNORMAL HIGH (ref 70–99)
Glucose-Capillary: 81 mg/dL (ref 70–99)
Glucose-Capillary: 211 mg/dL — ABNORMAL HIGH (ref 70–99)
Glucose-Capillary: 170 mg/dL — ABNORMAL HIGH (ref 70–99)

## 2019-10-28 LAB — COMPREHENSIVE METABOLIC PANEL
ALT: 16 U/L (ref 0–44)
AST: 28 U/L (ref 15–41)
Albumin: 2.7 g/dL — ABNORMAL LOW (ref 3.5–5.0)
Alkaline Phosphatase: 59 U/L (ref 38–126)
Anion gap: 7 (ref 5–15)
BUN: 12 mg/dL (ref 6–20)
CO2: 24 mmol/L (ref 22–32)
Calcium: 8 mg/dL — ABNORMAL LOW (ref 8.9–10.3)
Chloride: 100 mmol/L (ref 98–111)
Creatinine, Ser: 0.67 mg/dL (ref 0.61–1.24)
GFR calc Af Amer: >60 mL/min (ref >60)
GFR calc non Af Amer: >60 mL/min (ref >60)
Glucose, Bld: 217 mg/dL — ABNORMAL HIGH (ref 70–99)
Potassium: 3.9 mmol/L (ref 3.5–5.1)
Sodium: 131 mmol/L — ABNORMAL LOW (ref 135–145)
Total Bilirubin: 0.4 mg/dL (ref 0.3–1.2)
Total Protein: 5 g/dL — ABNORMAL LOW (ref 6.5–8.1)

## 2019-10-28 LAB — LUPUS ANTICOAGULANT PANEL
DRVVT: 26.6 s (ref 0.0–47.0)
PTT Lupus Anticoagulant: 43.4 s (ref 0.0–51.9)

## 2019-10-28 LAB — HEMOGLOBIN A1C
Hgb A1c MFr Bld: 6.4 % — ABNORMAL HIGH (ref 4.8–5.6)
Mean Plasma Glucose: 136.98 mg/dL

## 2019-10-28 LAB — MAGNESIUM: Magnesium: 1.9 mg/dL (ref 1.7–2.4)

## 2019-10-28 LAB — PHOSPHORUS: Phosphorus: 2.9 mg/dL (ref 2.5–4.6)

## 2019-10-28 LAB — LACTIC ACID, PLASMA: Lactic Acid, Venous: 1.4 mmol/L (ref 0.5–1.9)

## 2019-10-28 MED ORDER — BUSPIRONE HCL 5 MG PO TABS
5.0000 mg | ORAL_TABLET | Freq: Two times a day (BID) | ORAL | Status: DC
  Administered 2019-10-28 – 2019-11-01 (×8): 5 mg via ORAL
  Filled 2019-10-28 (×9): qty 1

## 2019-10-28 MED ORDER — INSULIN DETEMIR 100 UNIT/ML ~~LOC~~ SOLN
15.0000 [IU] | Freq: Every day | SUBCUTANEOUS | Status: DC
  Administered 2019-10-28: 15 [IU] via SUBCUTANEOUS
  Administered 2019-10-29: 11 [IU] via SUBCUTANEOUS
  Administered 2019-10-30: 15 [IU] via SUBCUTANEOUS
  Filled 2019-10-28 (×5): qty 0.15

## 2019-10-28 MED ORDER — HYDROCORTISONE ACETATE 25 MG RE SUPP
25.0000 mg | Freq: Two times a day (BID) | RECTAL | Status: DC
  Filled 2019-10-28: qty 1

## 2019-10-28 MED ORDER — ENOXAPARIN SODIUM 40 MG/0.4ML ~~LOC~~ SOLN
40.0000 mg | SUBCUTANEOUS | Status: DC
  Administered 2019-10-29 – 2019-11-01 (×4): 40 mg via SUBCUTANEOUS
  Filled 2019-10-28 (×4): qty 0.4

## 2019-10-28 MED ORDER — INSULIN DETEMIR 100 UNIT/ML ~~LOC~~ SOLN
25.0000 [IU] | Freq: Every day | SUBCUTANEOUS | Status: DC
  Filled 2019-10-28: qty 0.25

## 2019-10-28 MED ORDER — PANTOPRAZOLE SODIUM 40 MG PO TBEC
40.0000 mg | DELAYED_RELEASE_TABLET | Freq: Every day | ORAL | Status: DC
  Administered 2019-10-28 – 2019-11-01 (×5): 40 mg via ORAL
  Filled 2019-10-28 (×5): qty 1

## 2019-10-28 MED ORDER — INSULIN DETEMIR 100 UNIT/ML ~~LOC~~ SOLN: 25.0000 [IU] | SUBCUTANEOUS | Status: DC

## 2019-10-28 MED ORDER — TRAMADOL HCL 50 MG PO TABS
50.0000 mg | ORAL_TABLET | Freq: Four times a day (QID) | ORAL | Status: DC | PRN
  Administered 2019-10-28 – 2019-11-01 (×8): 50 mg via ORAL
  Filled 2019-10-28 (×9): qty 1

## 2019-10-28 MED ORDER — INSULIN DETEMIR 100 UNIT/ML ~~LOC~~ SOLN
20.0000 [IU] | Freq: Every day | SUBCUTANEOUS | Status: DC
  Filled 2019-10-28: qty 0.2

## 2019-10-28 MED ORDER — VITAMIN C 500 MG PO TABS
1000.0000 mg | ORAL_TABLET | Freq: Two times a day (BID) | ORAL | Status: DC
  Administered 2019-10-28 – 2019-11-01 (×9): 1000 mg via ORAL
  Filled 2019-10-28 (×9): qty 2

## 2019-10-28 MED ORDER — BISACODYL 10 MG RE SUPP: 10.0000 mg | Freq: Every day | RECTAL | Status: DC | PRN

## 2019-10-28 MED ORDER — MELATONIN 5 MG PO TABS
2.5000 mg | ORAL_TABLET | Freq: Every evening | ORAL | Status: DC | PRN
  Administered 2019-10-28: 2.5 mg via ORAL
  Filled 2019-10-28 (×3): qty 0.5

## 2019-10-28 MED ORDER — SUCRALFATE 1 G PO TABS: 1.0000 g | ORAL_TABLET | Freq: Three times a day (TID) | ORAL | Status: DC

## 2019-10-28 MED ORDER — SUCRALFATE 1 G PO TABS
1.0000 g | ORAL_TABLET | Freq: Three times a day (TID) | ORAL | Status: DC
  Administered 2019-10-28 – 2019-11-01 (×6): 1 g via ORAL
  Filled 2019-10-28 (×9): qty 1

## 2019-10-28 MED ORDER — METHENAMINE MANDELATE 1 G PO TABS
1000.0000 mg | ORAL_TABLET | Freq: Two times a day (BID) | ORAL | Status: DC
  Administered 2019-10-28 – 2019-11-01 (×9): 1000 mg via ORAL
  Filled 2019-10-28 (×10): qty 1

## 2019-10-28 MED ORDER — HYDROCORTISONE ACETATE 25 MG RE SUPP
25.0000 mg | Freq: Two times a day (BID) | RECTAL | Status: DC | PRN
  Administered 2019-10-28: 25 mg via RECTAL
  Filled 2019-10-28 (×2): qty 1

## 2019-10-28 MED ORDER — MIDODRINE HCL 5 MG PO TABS
2.5000 mg | ORAL_TABLET | Freq: Two times a day (BID) | ORAL | Status: DC | PRN
  Filled 2019-10-28: qty 0.5
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
## 2019-10-28 NOTE — NC FL2 (Signed)
[place] LEVEL OF CARE SCREENING TOOL     IDENTIFICATION  Patient Name: [name] Birthdate: 08/15/1971 Sex: male Admission Date (Current Location): 10/26/2019  [place] and [place] Number:  Engineering geologist and Address:  [facility], [address] [place], [place] [address]      Provider Number: [id number]  Attending Physician Name and Address:  Lavina Hamman, MD  Relative Name and Phone Number:       Current Level of Care: Hospital Recommended Level of Care: [place] Prior Approval Number:    Date Approved/Denied:   PASRR Number: [id number]  Discharge Plan: SNF    Current Diagnoses: Patient Active Problem List   Diagnosis Date Noted  . DKA (diabetic ketoacidoses) ([place]) 10/26/2019  . ARF (acute renal failure) ([place]) 10/26/2019  . Pressure injury of skin 10/20/2019  . Elevated blood pressure reading without diagnosis of hypertension 10/20/2019  . Hypomagnesemia 10/20/2019  . Rotator cuff tear 10/19/2019  . Muscle cramps 04/01/2019  . Hyponatremia 12/25/2018  . Healthcare maintenance 05/05/2018  . Depression, recurrent ([place]) 08/09/2017  . Skin ulcer ([place]) 06/05/2016  . Paraplegia ([place]) 06/05/2016  . Trigger finger of right hand 12/22/2015  . Food allergy 09/10/2014  . Anemia 08/20/2013  . Alcohol abuse 08/20/2013  . Edema 05/21/2013  . Syncope 12/15/2012  . Hypotension 11/27/2012  . Type 1 diabetes mellitus ([place]) 11/24/2012  . History of frequent urinary tract infections 11/24/2012    Orientation RESPIRATION BLADDER Height & Weight            Weight: 75.5 kg Height:  6' (182.9 cm)  BEHAVIORAL SYMPTOMS/MOOD NEUROLOGICAL BOWEL NUTRITION STATUS           AMBULATORY STATUS COMMUNICATION OF NEEDS Skin                               Personal Care Assistance Level of Assistance              Functional Limitations Info             SPECIAL CARE FACTORS FREQUENCY                        Contractures      Additional Factors Info                  Current Medications (10/28/2019):  This is the current hospital active medication list Current Facility-Administered Medications  Medication Dose Route Frequency Provider Last Rate Last Dose  .  stroke: mapping our early stages of recovery book   Does not apply Once Lavina Hamman, MD      . acetaminophen (TYLENOL) tablet 650 mg  650 mg Oral Q6H PRN Vertis Kelch, NP   650 mg at 10/27/19 2006  . aspirin suppository 300 mg  300 mg Rectal Daily Lavina Hamman, MD       Or  . aspirin tablet 325 mg  325 mg Oral Daily Lavina Hamman, MD   325 mg at 10/28/19 1008  . baclofen (LIORESAL) tablet 40 mg  40 mg Oral TID PRN Lavina Hamman, MD   40 mg at 10/28/19 0615  . Chlorhexidine Gluconate Cloth 2 % PADS 6 each  6 each Topical Daily Lavina Hamman, MD      . dextrose 50 % solution 0-50 mL  0-50 mL Intravenous PRN Gean Birchwood  N, MD      . Derrill Memo ON 10/29/2019] enoxaparin (LOVENOX) injection 40 mg  40 mg Subcutaneous Q24H Charlett Nose, RPH      . hydrocortisone (ANUSOL-HC) suppository 25 mg  25 mg Rectal BID PRN Lavina Hamman, MD      . insulin aspart (novoLOG) injection 2-15 Units  2-15 Units Subcutaneous TID PC Lavina Hamman, MD   2 Units at 10/28/19 1506  . insulin detemir (LEVEMIR) injection 20 Units  20 Units Subcutaneous QHS Lavina Hamman, MD      . methenamine (MANDELAMINE) tablet 1,000 mg  1,000 mg Oral BID Lavina Hamman, MD   1,000 mg at 10/28/19 1520  . midodrine (PROAMATINE) tablet 2.5 mg  2.5 mg Oral BID PRN Lavina Hamman, MD      . naphazoline-glycerin (CLEAR EYES REDNESS) ophth solution 1-2 drop  1-2 drop Both Eyes QID PRN Rise Patience, MD      . pantoprazole (PROTONIX) EC tablet 40 mg  40 mg Oral Daily Lavina Hamman, MD   40 mg at 10/28/19 1504  . sucralfate (CARAFATE) tablet 1 g  1 g Oral TID AC Lavina Hamman, MD   1 g at 10/28/19 1505  . traMADol  (ULTRAM) tablet 50 mg  50 mg Oral Q6H PRN Lavina Hamman, MD   50 mg at 10/28/19 1033  . vitamin C (ASCORBIC ACID) tablet 1,000 mg  1,000 mg Oral BID Lavina Hamman, MD   1,000 mg at 10/28/19 1520     Discharge Medications: Please see discharge summary for a list of discharge medications.  Relevant Imaging Results:  Relevant Lab Results:   Additional Information ss# [id number]  Marshell Garfinkel, RN
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
## 2019-10-28 NOTE — Care Management (Signed)
[facility] will have to authorize SNF. Please reach back to RNCM/TOC team.

## 2019-10-28 NOTE — Progress Notes (Signed)
Patient stated his sugar was low and he needed it checked.  We checked it immediately (11:18am).  It was 206.  Patient refused insulin.    Patient had 3 units this morning (the dose patient selected) when his CBG was 177.  Patient ate a banana for breakfast and did not want the rest of his food.  Patient refused to eat any of his lunch.  Patient is able to order his food himself.  Patient wanted some of his medications changed.  Things he requested from the RN were relayed to his MD who adjusted his medications according to the patient's requests.  Patient denied pain, then later said he wanted tramadol for pain.  It was given.  Patient wanted his L hand IV removed.  RN removed it.  RN noticed some bleeding, so she reinforced the gauze dressing and wrapped it with a roll of cotton dressing.  Phillis Knack, RN
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
## 2019-10-28 NOTE — Progress Notes (Signed)
PT Cancellation Note  Patient Details Name: [name] MRN: [id number] DOB: Aug 18, 1971   Cancelled Treatment:    Reason Eval/Treat Not Completed: Other (comment);Patient at procedure or test/unavailable(Medical staff in room with patient upon PT attempt. Will attempt again at a later time/date.)  Janna Arch, PT, DPT   10/28/2019, 3:05 PM

## 2019-10-28 NOTE — Progress Notes (Signed)
Patient requested electrical stimulation to dedicate.  MD stated that was not necessary.  A suppository will be available when he requests it (PRN).  MD order states patient can request insulin as he needs it in the amount he wants.  RN has been following those orders.  Blood sugar is checked as often as he likes and he decides how much and when he takes the insulin.  Phillis Knack, RN

## 2019-10-28 NOTE — Progress Notes (Addendum)
Patient requested his sugar be checked.  It was 81.  He wanted 7 units of insulin.  RN told patient she felt uncomfortable giving him insulin when his CBG was < 90.  He will ask for his sugar to be checked again next shift.  RN informed him there is some long acting insulin for him tonight.  Patient ate his dinner.  Patient reported anxiety earlier.  The MD came and spoke with him and added a medicine twice a day.  Phillis Knack, RN

## 2019-10-29 LAB — COMPREHENSIVE METABOLIC PANEL
ALT: 17 U/L (ref 0–44)
AST: 33 U/L (ref 15–41)
Albumin: 3.2 g/dL — ABNORMAL LOW (ref 3.5–5.0)
Alkaline Phosphatase: 83 U/L (ref 38–126)
Anion gap: 10 (ref 5–15)
BUN: 8 mg/dL (ref 6–20)
CO2: 24 mmol/L (ref 22–32)
Calcium: 8.4 mg/dL — ABNORMAL LOW (ref 8.9–10.3)
Chloride: 97 mmol/L — ABNORMAL LOW (ref 98–111)
Creatinine, Ser: 0.59 mg/dL — ABNORMAL LOW (ref 0.61–1.24)
GFR calc Af Amer: >60 mL/min (ref >60)
GFR calc non Af Amer: >60 mL/min (ref >60)
Glucose, Bld: 152 mg/dL — ABNORMAL HIGH (ref 70–99)
Potassium: 3.8 mmol/L (ref 3.5–5.1)
Sodium: 131 mmol/L — ABNORMAL LOW (ref 135–145)
Total Bilirubin: 0.6 mg/dL (ref 0.3–1.2)
Total Protein: 5.9 g/dL — ABNORMAL LOW (ref 6.5–8.1)

## 2019-10-29 LAB — GLUCOSE, CAPILLARY
Glucose-Capillary: 111 mg/dL — ABNORMAL HIGH (ref 70–99)
Glucose-Capillary: 169 mg/dL — ABNORMAL HIGH (ref 70–99)
Glucose-Capillary: 237 mg/dL — ABNORMAL HIGH (ref 70–99)
Glucose-Capillary: 224 mg/dL — ABNORMAL HIGH (ref 70–99)
Glucose-Capillary: 124 mg/dL — ABNORMAL HIGH (ref 70–99)

## 2019-10-29 LAB — CARDIOLIPIN ANTIBODIES, IGG, IGM, IGA
Anticardiolipin IgA: <9 APL U/mL (ref 0–11)
Anticardiolipin IgG: <9 GPL U/mL (ref 0–14)
Anticardiolipin IgM: 10 MPL U/mL (ref 0–12)

## 2019-10-29 LAB — PROTEIN C, TOTAL: Protein C, Total: 80 % (ref 60–150)

## 2019-10-29 LAB — PHOSPHORUS: Phosphorus: 2.8 mg/dL (ref 2.5–4.6)

## 2019-10-29 LAB — MAGNESIUM: Magnesium: 1.7 mg/dL (ref 1.7–2.4)

## 2019-10-29 MED ORDER — LORAZEPAM 1 MG PO TABS
1.0000 mg | ORAL_TABLET | ORAL | Status: AC | PRN
  Administered 2019-10-29 (×4): 1 mg via ORAL
  Filled 2019-10-29 (×3): qty 1

## 2019-10-29 MED ORDER — LORAZEPAM 2 MG/ML IJ SOLN: 2.0000 mg | Freq: Once | INTRAMUSCULAR | Status: DC

## 2019-10-29 NOTE — Progress Notes (Signed)
[facility] Progress Note  Patient: [name] FBX:[id number]   PCP: Einar Pheasant, MD DOB: 08-Nov-1971   DOA: 10/26/2019   DOS: 10/29/2019   Date of Service: the patient was seen and examined on 10/29/2019  Chief Complaint  Patient presents with  . Hyperglycemia   Brief hospital course: Keylor Rands Neyra is a 48 y.o. 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  Most of the history was obtained from ER physician.  Not able to reach patient's family.  Patient did not have any nausea vomiting diarrhea chest pain or shortness of breath fever or chills. After admission patient remained confused for the past week. His respiratory condition worsened as well requiring CCM evaluation. Mentation improved rapidly.  CT head and MRI brain shows possibility of acute stroke.  Neurology, CCM and cardiology consulted.  Currently further plan is continue further stroke work-up.  Subjective: ANXIETY IS BETTER. No nausea and vomiting. No chest pain no abdominal pain.   Assessment and Plan: Scheduled Meds: .  stroke: mapping our early stages of recovery book   Does not apply Once  . aspirin  300 mg Rectal Daily   Or  . aspirin  325 mg Oral Daily  . busPIRone  5 mg Oral BID  . Chlorhexidine Gluconate Cloth  6 each Topical Daily  . enoxaparin (LOVENOX) injection  40 mg Subcutaneous Q24H  . insulin aspart  2-15 Units Subcutaneous TID PC  . insulin detemir  15 Units Subcutaneous QHS  . methenamine  1,000 mg Oral BID  . pantoprazole  40 mg Oral Daily  . sucralfate  1 g Oral TID AC  . vitamin C  1,000 mg Oral BID   Continuous Infusions:  PRN Meds: acetaminophen, baclofen, bisacodyl, dextrose, hydrocortisone, Melatonin, midodrine, naphazoline-glycerin, traMADol   Diabetic  ketoacidosis in type I diabetic Type I diabetic with uncontrolled hyper and hypoglycemia with neuropathy complication Precipitating cause not clear.   Patient was given fluid bolus in the ER Treated with IV insulin EndoTool  Anion gap closed Currently on Lantus with sliding scale insulin. Was hypoglycemic for a brief time and started on D5 again. Patient would like to continue to manage his insulin the way he manages at home and not per our sliding scale protocol.  Elevated troponin. Patient's troponin continues to trend up. Cardiology consulted. Suspect patient's hypotension as well as bradycardia is likely secondary to autonomic dysfunction. Started on midodrine but Echocardiogram currently pending. Patient does not have any chest pain or EKG changes to suggest any ischemic event.  Acute CVA. Acute metabolic encephalopathy Neurology consulted for further assistance. Patient was unresponsive yesterday to sternal rub. Pupils were nonreactive. CT scan of the head did show possibility of an acute infarct although unable to explain completely to yesterday.   EEG proved to show any evidence of epileptogenic foci. MRI brain also shows evidence of acute stroke but no other acute abnormality. Neurology consulted. Carotid Doppler negative. Upper extremity Doppler ordered currently pending. TEE will be required. Given that the patient is likely not going to be discharged until Monday we will request cardiology to perform TEE if it is possible on Monday.  Anemia hemoglobin appears to have worsened from previous.  Could be from postoperative blood loss.  Recent left shoulder surgery for rotator cuff. Appreciate Dr. Nicholaus Bloom assistance  History of paraplegia with autonomic dysreflexia takes fludrocortisone as needed.  Patient was initially hypotensive improved with fluids.  Does not look septic however patient was place patient on ceftriaxone for possible UTI, discontinue to biotic  Pressure  Injury 10/19/19 Coccyx Unstageable - Full thickness tissue loss in which the base of the ulcer is covered by slough (yellow, tan, gray, green or brown) and/or eschar (tan, brown or black) in the wound bed. (Active)  10/19/19 1730  Location: Coccyx  Location Orientation:   Staging: Unstageable - Full thickness tissue loss in which the base of the ulcer is covered by slough (yellow, tan, gray, green or brown) and/or eschar (tan, brown or black) in the wound bed.  Wound Description (Comments):   Present on Admission: Yes     Diet: Cardiac diet and Carb modified diet  DVT Prophylaxis: Subcutaneous Lovenox   Advance goals of care discussion: DNR  Family Communication: no family was present at bedside, at the time of interview.   Disposition:  Discharge to be SNF, likely Monday after TEE.  Consultants: PCCM, neurology, cardiology Procedures: EEG, echocardiogram  Antibiotics: Anti-infectives (From admission, onward)   Start     Dose/Rate Route Frequency Ordered Stop   10/28/19 1115  methenamine (MANDELAMINE) tablet 1,000 mg     1,000 mg Oral 2 times daily 10/28/19 1110     10/26/19 0630  cefTRIAXone (ROCEPHIN) 1 g in sodium chloride 0.9 % 100 mL IVPB  Status:  Discontinued     1 g 200 mL/hr over 30 Minutes Intravenous Every 24 hours 10/26/19 0622 10/28/19 1108       Objective: Physical Exam: Vitals:   10/29/19 1200 10/29/19 1800 10/29/19 1820 10/29/19 1856  BP: (!) 150/99 (!) 149/99 (!) 157/117 (!) 138/106  Pulse: (!) 103 90 (!) 107   Resp: 20  17   Temp: 98.4 F (36.9 C)  98.8 F (37.1 C)   TempSrc:   Oral   SpO2: 92% 98% 99%   Weight:      Height:        Intake/Output Summary (Last 24 hours) at 10/29/2019 1902 Last data filed at 10/29/2019 1800 Gross per 24 hour  Intake 1000 ml  Output 3950 ml  Net -2950 ml   Filed Weights   10/26/19 0116 10/26/19 1048  Weight: 79.4 kg 75.5 kg   General: alert and oriented to time, place, and person. Appear in mild distress,  affect appropriate Eyes: PERRL, Conjunctiva normal ENT: Oral Mucosa Clear, moist  Neck: difficult to assess  JVD, no Abnormal Mass Or lumps Cardiovascular: S1 and S2 Present, no Murmur,  Respiratory: good respiratory effort, Bilateral Air entry equal and Decreased, no signs of accessory muscle use, Clear to Auscultation, no Crackles, no wheezes Abdomen: Bowel Sound present, Soft and difficult to assess  tenderness, no hernia Skin: no rashes  Extremities: no Pedal edema, no calf tenderness Neurologic: paraparesis of legs noted Gait not checked due to patient safety concerns  Data Reviewed: I have personally reviewed and interpreted daily labs, tele strips, imagings as discussed above. I reviewed all nursing notes, pharmacy notes, vitals, pertinent old records I have discussed plan of care as described above with RN and patient/family.  CBC: Recent Labs  Lab 10/26/19 0119 10/27/19 0613  WBC 6.5 7.1  NEUTROABS  --  5.8  HGB 10.5* 10.1*  HCT 31.8* 29.1*  MCV 97.5 93.0  PLT 188 170   Basic Metabolic Panel: Recent Labs  Lab 10/26/19 0119  10/26/19 1901 10/26/19 2219  10/27/19 0613 10/28/19 0500 10/29/19 0431  NA 126*   < > 133* 132* 134* 131* 131*  K 5.2*   < > 3.4* 3.2* 3.3* 3.9 3.8  CL 90*   < > 103 102 102 100 97*  CO2 11*   < > 21* 21* 23 24 24   GLUCOSE 536*   < > 201* 167* 114* 217* 152*  BUN 31*   < > 23* 21* 17 12 8   CREATININE 1.56*   < > 0.83 0.65 0.59* 0.67 0.59*  CALCIUM 8.4*   < > 8.3* 8.2* 8.2* 8.0* 8.4*  MG 1.5*  --   --   --  1.7 1.9 1.7  PHOS 5.1*  --   --   --  3.1 2.9 2.8   < > = values in this interval not displayed.    Liver Function Tests: Recent Labs  Lab 10/26/19 0852 10/27/19 0613 10/28/19 0500 10/29/19 0431  AST 29 25 28  33  ALT 20 17 16 17   ALKPHOS 87 68 59 83  BILITOT 1.4* 0.6 0.4 0.6  PROT 6.6 5.5* 5.0* 5.9*  ALBUMIN 3.7 3.0* 2.7* 3.2*   No results for input(s): LIPASE, AMYLASE in the last 168 hours. Recent Labs  Lab  10/26/19 0852  AMMONIA 16   Coagulation Profile: No results for input(s): INR, PROTIME in the last 168 hours. Cardiac Enzymes: No results for input(s): CKTOTAL, CKMB, CKMBINDEX, TROPONINI in the last 168 hours. BNP (last 3 results) No results for input(s): PROBNP in the last 8760 hours. CBG: Recent Labs  Lab 10/28/19 2130 10/29/19 0156 10/29/19 0812 10/29/19 1213 10/29/19 1646  GLUCAP 170* 111* 169* 237* 224*   Studies: No results found.   Time spent: 35 minutes  Author: Berle Mull, MD [facility] 10/29/2019 7:02 PM  To reach On-call, see care teams to locate the attending and reach out to them via [email or web address]. If 7PM-7AM, please contact night-coverage If you still have difficulty reaching the attending provider, please page the [facility] (Director on Call) for [facility] on amion for assistance.

## 2019-10-29 NOTE — Progress Notes (Signed)
Patient transferred from ICU.

## 2019-10-29 NOTE — Progress Notes (Addendum)
0820 On morning rounds patient requested his new prn Ativan for his muscle spasms- mainly his legs. Given prn then repeated in 30 minutes per orders.

## 2019-10-29 NOTE — Progress Notes (Addendum)
Good day. Required very little pain medication. Patient tries to be independent . Governs own insulin intact according to his calculations of carbohydrates, intake and CBG. Repositioned multiple times throughout the day.  At 1724 complained of back/ decubiti pain. Given prn for pain and anxiety.  Report called to [place] on [address].
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
## 2019-10-30 ENCOUNTER — Encounter
Admission: EM | Disposition: A | Discharge status: Skilled Nursing Facility | Payer: Self-pay | Source: Skilled Nursing Facility | Attending: Internal Medicine

## 2019-10-30 ENCOUNTER — Encounter: Payer: Self-pay | Admitting: Internal Medicine

## 2019-10-30 ENCOUNTER — Inpatient Hospital Stay (HOSPITAL_COMMUNITY)
Admit: 2019-10-30 | Discharge: 2019-10-30 | Disposition: A | Discharge status: Home / Self Care | Payer: Medicare Other | Attending: Cardiovascular Disease | Admitting: Cardiovascular Disease

## 2019-10-30 ENCOUNTER — Telehealth: Payer: Self-pay | Admitting: Cardiovascular Disease

## 2019-10-30 ENCOUNTER — Inpatient Hospital Stay (INDEPENDENT_AMBULATORY_CARE_PROVIDER_SITE_OTHER): Payer: Medicare Other

## 2019-10-30 DIAGNOSIS — L89100 Pressure ulcer of unspecified part of back, unstageable: Secondary | ICD-10-CM

## 2019-10-30 DIAGNOSIS — I63443 Cerebral infarction due to embolism of bilateral cerebellar arteries: Secondary | ICD-10-CM

## 2019-10-30 DIAGNOSIS — I639 Cerebral infarction, unspecified: Secondary | ICD-10-CM

## 2019-10-30 DIAGNOSIS — E1069 Type 1 diabetes mellitus with other specified complication: Secondary | ICD-10-CM

## 2019-10-30 DIAGNOSIS — I6389 Other cerebral infarction: Secondary | ICD-10-CM

## 2019-10-30 HISTORY — PX: TEE WITHOUT CARDIOVERSION: SHX5443

## 2019-10-30 LAB — CBC WITH DIFFERENTIAL/PLATELET
Abs Immature Granulocytes: 0.04 10*3/uL (ref 0.00–0.07)
Basophils Absolute: 0 10*3/uL (ref 0.0–0.1)
Basophils Relative: 1 %
Eosinophils Absolute: 0 10*3/uL (ref 0.0–0.5)
Eosinophils Relative: 1 %
HCT: 36.4 % — ABNORMAL LOW (ref 39.0–52.0)
Hemoglobin: 12.9 g/dL — ABNORMAL LOW (ref 13.0–17.0)
Immature Granulocytes: 1 %
Lymphocytes Relative: 16 %
Lymphs Abs: 1.1 10*3/uL (ref 0.7–4.0)
MCH: 32 pg (ref 26.0–34.0)
MCHC: 35.4 g/dL (ref 30.0–36.0)
MCV: 90.3 fL (ref 80.0–100.0)
Monocytes Absolute: 0.7 10*3/uL (ref 0.1–1.0)
Monocytes Relative: 10 %
Neutro Abs: 4.9 10*3/uL (ref 1.7–7.7)
Neutrophils Relative %: 71 %
Platelets: 275 10*3/uL (ref 150–400)
RBC: 4.03 MIL/uL — ABNORMAL LOW (ref 4.22–5.81)
RDW: 11.7 % (ref 11.5–15.5)
WBC: 6.8 10*3/uL (ref 4.0–10.5)
nRBC: 0 % (ref 0.0–0.2)

## 2019-10-30 LAB — GLUCOSE, CAPILLARY
Glucose-Capillary: 149 mg/dL — ABNORMAL HIGH (ref 70–99)
Glucose-Capillary: 133 mg/dL — ABNORMAL HIGH (ref 70–99)
Glucose-Capillary: 152 mg/dL — ABNORMAL HIGH (ref 70–99)
Glucose-Capillary: 171 mg/dL — ABNORMAL HIGH (ref 70–99)
Glucose-Capillary: 182 mg/dL — ABNORMAL HIGH (ref 70–99)
Glucose-Capillary: 262 mg/dL — ABNORMAL HIGH (ref 70–99)
Glucose-Capillary: 242 mg/dL — ABNORMAL HIGH (ref 70–99)
Glucose-Capillary: 265 mg/dL — ABNORMAL HIGH (ref 70–99)

## 2019-10-30 LAB — COMPREHENSIVE METABOLIC PANEL
ALT: 19 U/L (ref 0–44)
AST: 27 U/L (ref 15–41)
Albumin: 3.5 g/dL (ref 3.5–5.0)
Alkaline Phosphatase: 91 U/L (ref 38–126)
Anion gap: 11 (ref 5–15)
BUN: 10 mg/dL (ref 6–20)
CO2: 30 mmol/L (ref 22–32)
Calcium: 8.9 mg/dL (ref 8.9–10.3)
Chloride: 90 mmol/L — ABNORMAL LOW (ref 98–111)
Creatinine, Ser: 0.58 mg/dL — ABNORMAL LOW (ref 0.61–1.24)
GFR calc Af Amer: >60 mL/min (ref >60)
GFR calc non Af Amer: >60 mL/min (ref >60)
Glucose, Bld: 170 mg/dL — ABNORMAL HIGH (ref 70–99)
Potassium: 3.8 mmol/L (ref 3.5–5.1)
Sodium: 131 mmol/L — ABNORMAL LOW (ref 135–145)
Total Bilirubin: 0.8 mg/dL (ref 0.3–1.2)
Total Protein: 6.5 g/dL (ref 6.5–8.1)

## 2019-10-30 LAB — PROTIME-INR
INR: 1 (ref 0.8–1.2)
Prothrombin Time: 12.6 seconds (ref 11.4–15.2)

## 2019-10-30 LAB — MAGNESIUM: Magnesium: 1.8 mg/dL (ref 1.7–2.4)

## 2019-10-30 SURGERY — ECHOCARDIOGRAM, TRANSESOPHAGEAL
Anesthesia: Moderate Sedation

## 2019-10-30 MED ORDER — ASPIRIN 325 MG PO TABS: 325.0000 mg | ORAL_TABLET | Freq: Every day | ORAL | 0 refills | Status: DC

## 2019-10-30 MED ORDER — TRIAMCINOLONE ACETONIDE 40 MG/ML IJ SUSP
40.0000 mg | Freq: Once | INTRAMUSCULAR | Status: AC
  Administered 2019-10-30: 40 mg
  Filled 2019-10-30: qty 1

## 2019-10-30 MED ORDER — BUTAMBEN-TETRACAINE-BENZOCAINE 2-2-14 % EX AERO
INHALATION_SPRAY | CUTANEOUS | Status: AC
  Filled 2019-10-30: qty 5

## 2019-10-30 MED ORDER — BACLOFEN 10 MG PO TABS: 40.0000 mg | ORAL_TABLET | Freq: Three times a day (TID) | ORAL | Status: DC | PRN

## 2019-10-30 MED ORDER — LIDOCAINE VISCOUS HCL 2 % MT SOLN
OROMUCOSAL | Status: AC
  Filled 2019-10-30: qty 15

## 2019-10-30 MED ORDER — BUSPIRONE HCL 5 MG PO TABS: 5.0000 mg | ORAL_TABLET | Freq: Two times a day (BID) | ORAL | 0 refills | Status: DC

## 2019-10-30 MED ORDER — MIDAZOLAM HCL 5 MG/5ML IJ SOLN
INTRAMUSCULAR | Status: AC | PRN
  Administered 2019-10-30: 1 mg via INTRAVENOUS
  Administered 2019-10-30: 2 mg via INTRAVENOUS

## 2019-10-30 MED ORDER — BUPIVACAINE HCL (PF) 0.5 % IJ SOLN
5.0000 mL | Freq: Once | INTRAMUSCULAR | Status: DC
  Filled 2019-10-30: qty 10

## 2019-10-30 MED ORDER — MIDODRINE HCL 2.5 MG PO TABS: 2.5000 mg | ORAL_TABLET | Freq: Two times a day (BID) | ORAL | 0 refills | Status: DC

## 2019-10-30 MED ORDER — PANTOPRAZOLE SODIUM 40 MG PO TBEC: 40.0000 mg | DELAYED_RELEASE_TABLET | Freq: Every day | ORAL | 0 refills | Status: DC

## 2019-10-30 MED ORDER — SODIUM CHLORIDE FLUSH 0.9 % IV SOLN
INTRAVENOUS | Status: AC
  Filled 2019-10-30: qty 10

## 2019-10-30 MED ORDER — FENTANYL CITRATE (PF) 100 MCG/2ML IJ SOLN
INTRAMUSCULAR | Status: AC
  Filled 2019-10-30: qty 2

## 2019-10-30 MED ORDER — BACLOFEN 20 MG PO TABS: 40.0000 mg | ORAL_TABLET | Freq: Three times a day (TID) | ORAL | 0 refills | Status: DC | PRN

## 2019-10-30 MED ORDER — FENTANYL CITRATE (PF) 100 MCG/2ML IJ SOLN
INTRAMUSCULAR | Status: AC | PRN
  Administered 2019-10-30: 50 ug via INTRAVENOUS

## 2019-10-30 MED ORDER — HYDROCORTISONE ACETATE 25 MG RE SUPP: 25.0000 mg | Freq: Two times a day (BID) | RECTAL | 0 refills | Status: DC | PRN

## 2019-10-30 MED ORDER — MIDAZOLAM HCL 5 MG/5ML IJ SOLN
INTRAMUSCULAR | Status: AC
  Filled 2019-10-30: qty 5

## 2019-10-30 MED ORDER — STROKE: EARLY STAGES OF RECOVERY BOOK: 1.0000 | Freq: Once | 0 refills | Status: AC

## 2019-10-30 MED ORDER — OXYCODONE HCL 5 MG PO TABS: 5.0000 mg | ORAL_TABLET | ORAL | 0 refills | Status: DC | PRN

## 2019-10-30 MED ORDER — SODIUM CHLORIDE 0.9 % IV SOLN
INTRAVENOUS | Status: DC
  Administered 2019-10-30: 10:00:00 via INTRAVENOUS

## 2019-10-30 NOTE — Progress Notes (Signed)
Physical Therapy Treatment Patient Details Name: [name] MRN: [id number] DOB: 12-Jul-1971 Today's Date: 10/30/2019    History of Present Illness Pt is a 48 y.o. male presenting to hospital 10/26/19 with elevated blood sugar (more than 500) and AMS.  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  Of note, pt s/p L rotator cuff repair 10/19/19 (discharged from hospital 10/25/19).  PMH includes C6/C7 incomplete quadriplegia, DM type I, autonomic dysreflexia, neurogenic bladder, h/o L knee surgery; pt also reports h/o L hip dislocation/issues s/p L knee surgery.    PT Comments    Patient in bed with RN, attempting to reposition to maximize pt comfort. HR on monitor ranging from 110s-120s during session and mobility. Pt performed rolling several times to reposition for pt comfort, repositioning to prevent skin breakdown, and to decrease autonomic dysreflexia response. Pt able to initiate movement and utilize RW, maxA to complete/LE management. Special donut pillow placed under pt hips per his request as well. The patient reported improvement and comfort at end of repositioning so further mobility attempts held. The patient would benefit from further skilled PT intervention to return to PLOF as able.  See precautions/restrictions for updated information from orthopedic MD regarding LUE.     Follow Up Recommendations  SNF     Equipment Recommendations  Other (comment)(single arm wheelchair, hoyer lift)    Recommendations for Other Services OT consult     Precautions / Restrictions Precautions Precautions: Shoulder;Fall Type of Shoulder Precautions: Per Ortho: He is to remain in his shoulder immobilizer  at all times, removing it only for bathing purposes.  He may begin gentle passive range of motion exercises of the left shoulder with physical therapy in another 2 weeks.  (11/23). Meanwhile, he can work with occupational therapy on range of motion and strength exercises for his wrist, fingers, and elbow. Shoulder Interventions: Shoulder sling/immobilizer;At all times;Off for dressing/bathing/exercises Precaution Booklet Issued: No Restrictions Weight Bearing Restrictions: Yes LUE Weight Bearing: Non weight bearing RLE Weight Bearing: Non weight bearing LLE Weight Bearing: Non weight bearing Other Position/Activity Restrictions: No L hip aBduction ROM (per pt request d/t concerns for dislocation)    Mobility  Bed Mobility Overal bed mobility: Needs Assistance Bed Mobility: Rolling Rolling: Max assist         General bed mobility comments: rolling to the R pt able to initiate movement towards R and grab bars, totalA for LE management  Transfers                 General transfer comment: Deferred  Ambulation/Gait                 Stairs             Wheelchair Mobility    Modified Rankin (Stroke Patients Only)       Balance Overall balance assessment: Needs assistance Sitting-balance support: (assist to reposition in bed)                                        Cognition Arousal/Alertness: Awake/alert Behavior During Therapy: WFL for tasks assessed/performed;Anxious Overall Cognitive Status: Within Functional Limits for tasks assessed  Exercises Other Exercises Other Exercises: OT/PT assist pt with repositioning in bed to maximize safety, skin integrity and comfort this date.    General Comments General comments (skin integrity, edema, etc.): Pt with HR up to 125 BPM during session      Pertinent Vitals/Pain Pain Assessment: No/denies pain    Home Living                       Prior Function            PT Goals (current goals can now be found in the care plan section) Progress towards PT goals: Progressing toward goals    Frequency    7X/week      PT Plan Current plan remains appropriate    Co-evaluation              AM-PAC PT "6 Clicks" Mobility   Outcome Measure  Help needed turning from your back to your side while in a flat bed without using bedrails?: A Lot Help needed moving from lying on your back to sitting on the side of a flat bed without using bedrails?: Total Help needed moving to and from a bed to a chair (including a wheelchair)?: Total Help needed standing up from a chair using your arms (e.g., wheelchair or bedside chair)?: Total Help needed to walk in hospital room?: Total Help needed climbing 3-5 steps with a railing? : Total 6 Click Score: 7    End of Session   Activity Tolerance: Patient tolerated treatment well Patient left: in bed;with call bell/phone within reach;with bed alarm set;Other (comment) Nurse Communication: Mobility status;Precautions;Other (comment) PT Visit Diagnosis: Other abnormalities of gait and mobility (R26.89);Muscle weakness (generalized) (M62.81);Pain Pain - Right/Left: Left Pain - part of body: Shoulder     Time: 1410-1440 PT Time Calculation (min) (ACUTE ONLY): 30 min  Charges:  $Therapeutic Activity: 8-22 mins                     Lieutenant Diego PT, DPT 3:06 PM,10/30/19 [phone]

## 2019-10-30 NOTE — Telephone Encounter (Signed)
Could not reach patient by phone left message to return call to office.and sent my chart message .

## 2019-10-30 NOTE — Telephone Encounter (Signed)
[name], can you call [name] and inform him that insulin pump would be through endocrinology.  I can place order for referral.  Also, confirm doing ok and let him know that I am seeing pts right now and unable to call.  I can call him later if need.

## 2019-10-30 NOTE — Telephone Encounter (Signed)
Dr. Rockey Situ was down in the clinic this morning and stated the patient was admitted to the floor and needed to have a ZIO XT placed for stroke prior to discharge.   I went to the floor and placed a ZIO XT on the patient.  ID #: [id number]

## 2019-10-30 NOTE — Discharge Summary (Signed)
Physician Discharge Summary  [name] DOB: [birth year] DOA: 10/26/2019  PCP: Einar Pheasant, MD  Admit date: 10/26/2019 Discharge date: 10/30/2019  Recommendations for Outpatient Follow-up:  1. Follow up with PCP in 7-10 days. 2. Follow up with Cardiology as directed. 3. Follow up with neurology as directed. 4. BMP to be drawn on 11/06/2019 and reported to PCP. 5. Follow up with orthopedic surgery in one month. 6. Cardiology to arrange for [facility] monitor before discharge.  Discharge Diagnoses: Principal diagnosis is #1 1. Lt Cerebellar CVA, Small right cerebellar infarct, and left ACA territory CVA with petechial hemorrhage. 2. DKA 3. DM I 4. Paraplegia with autonomic dysreflexia 5. Recent left shoulder surgery  Discharge Condition: Fair  Disposition: Rehab  Diet recommendation: Heart healthy/carbohydrate controlled  Filed Weights   10/26/19 0116 10/26/19 1048  Weight: 79.4 kg 75.5 kg    History of present illness:  [name] is a 48 y.o. 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  Most of the history was obtained from ER physician.  Not able to reach patient's family.  Patient did not have any nausea vomiting diarrhea chest pain or shortness of breath fever or chills.  ED Course: In the ER patient appeared confused and restless initially.  Became more alert and awake as he received IV fluids and insulin.  Patient also received 1 dose of droperidol.  Labs show sodium 126 potassium 5.2 bicarb 11 glucose 536 anion gap 25 procalcitonin was 1.1 lactic acid 1.7 VBG showed pH of 7.1 hemoglobin 10.5 UA showing features concerning for UTI COVID-19 test was pending.  Beta hydroxybutyric acid was more than 8.  Patient was started on IV insulin  infusion IV fluid bolus.  Patient blood pressure also improved with fluids.  At the time of my exam patient is more alert awake but still mildly restless.  Does not complain of any pain.  Patient is afebrile.  EKG shows normal sinus rhythm with nonspecific ST-T changes.  Chest x-ray was unremarkable.   Hospital Course: Ct head/MRI demonstrated left cerebellar infarct, small right cerebellar infarct, and Left ACA territory infarct with small petechial hemorrhage. 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 Most of the history was obtained from ER physician. Not able to reach patient's family. Patient did not have any nausea vomiting diarrhea chest pain or shortness of breath fever or chills. After admission patient remained confused for the past week. His respiratory condition worsened as well requiring CCM evaluation. Mentation improved rapidly.  CT head and MRI brain shows possibility of acute stroke.  Neurology, CCM and cardiology consulted.  TEE demonstrated no intracardiac thrombus or PFO. Cardiology has arranged [facility] monitor for discharge. The patient will follow up with cardiology and orthopedic surgery as directed. [name] was performed. It demonstrated: Minor carotid atherosclerosis. No hemodynamically significant ICA stenosis. Degree of narrowing less than 50% bilaterally by ultrasound criteria. Patent antegrade vertebral flow bilaterally.  Today's assessment: S: The patient is resting comfortably. No new complaints. O: Vitals:  Vitals:   10/30/19 1116 10/30/19 1207  BP: 125/84 (!) 118/92  Pulse:  98 (!) 101  Resp: 15 16  Temp: 99 F (37.2 C) 98.5 F (36.9 C)  SpO2: 98% 98%   Exam:  Constitutional:  . The patient is awake, alert,  and oriented x 3. No acute distress. Respiratory:  . No increased work of breathing. . No wheezes, rales, or rhonchi . No tactile fremitus Cardiovascular:  . Regular rate and rhythm . No murmurs, ectopy, or gallups. . No lateral PMI. No thrills. Abdomen:  . Abdomen is soft, non-tender, non-distended . No hernias, masses, or organomegaly . Normoactive bowel sounds.  Musculoskeletal:  . No cyanosis, clubbing, or edema Skin:  . No rashes, lesions, ulcers . palpation of skin: no induration or nodules Neurologic:  . CN 2-12 intact Psychiatric:  . Mental status o Mood, affect appropriate o Orientation to person, place, time    Discharge Instructions  Discharge Instructions    Activity as tolerated - No restrictions   Complete by: As directed    Call MD for:  difficulty breathing, headache or visual disturbances   Complete by: As directed    Call MD for:  temperature >100.4   Complete by: As directed    Diet - low sodium heart healthy   Complete by: As directed    Diet Carb Modified   Complete by: As directed    Discharge instructions   Complete by: As directed    Follow up with PCP in 7-10 days. Follow up with Cardiology as directed. Follow up with neurology as directed. BMP to be drawn on 11/06/2019 and reported to PCP. Follow up with orthopedic surgery in one month.   Increase activity slowly   Complete by: As directed      Allergies as of 10/30/2019      Reactions   Decongestant [pseudoephedrine Hcl] Other (See Comments)   Cause UTIs   Ivp Dye [iodinated Diagnostic Agents] Hives, Other (See Comments)   Urticaria   Sulfa Antibiotics Swelling, Other (See Comments)   Tongue swells      Medication List    STOP taking these medications   BUFFERED SALT PO   ciprofloxacin 500 MG tablet Commonly known as: Cipro   fludrocortisone 0.1 MG tablet Commonly known as: FLORINEF   omeprazole 40 MG capsule Commonly known as: PRILOSEC Replaced by: pantoprazole 40 MG  tablet     TAKE these medications    stroke: mapping our early stages of recovery book Misc 1 each by Does not apply route once for 1 dose.   aspirin 325 MG tablet Take 1 tablet (325 mg total) by mouth daily. Start taking on: October 31, 2019   B-12 2500 MCG Tabs Take 2,500 mg by mouth daily.   B-D UF III MINI PEN NEEDLES 31G X 5 MM Misc Generic drug: Insulin Pen Needle AS DIRECTED.   baclofen 20 MG tablet Commonly known as: LIORESAL Take 2 tablets (40 mg total) by mouth 3 (three) times daily as needed for muscle spasms. What changed: See the new instructions.   blood glucose meter kit and supplies Kit Dispense Dexcom G6 Sensor meter to check blood sugars up to 4 times daily. DX code E 10.9   busPIRone 5 MG tablet Commonly known as: BUSPAR Take 1 tablet (5 mg total) by mouth 2 (two) times daily.   Carafate 1 g tablet Generic drug: sucralfate Take 1 g by mouth 3 (three) times daily before meals.   HUMALOG JUNIOR KWIKPEN [place] Inject 1-6 Units into the skin 3 (three) times daily. If blood sugar  is less than 60, call MD. If blood sugar is 120 to 170, give 1 unit. If blood sugar is 171 to 220, give 2 units. If blood sugar is 221 to 270, give 3 units. If blood sugar is 271 to 321, give 4 units. If blood sugar is 322 to 371, give 5 units. If blood sugar is 372 to 421, give 6 units. If blood sugar is greater than 450, call MD.   hydrocortisone 25 MG suppository Commonly known as: ANUSOL-HC Place 1 suppository (25 mg total) rectally 2 (two) times daily as needed for hemorrhoids or anal itching.   ibuprofen 200 MG tablet Commonly known as: ADVIL Take 600 mg by mouth 3 (three) times daily as needed (for pain.).   insulin detemir 100 UNIT/ML injection Commonly known as: Levemir Inject 29 units at bedtime What changed:   how much to take  how to take this  when to take this  additional instructions   methenamine 1 g tablet Commonly known as: MANDELAMINE Take 1,000  mg by mouth 2 (two) times daily.   midodrine 2.5 MG tablet Commonly known as: PROAMATINE Take 1 tablet (2.5 mg total) by mouth 2 (two) times daily with a meal.   naphazoline-glycerin 0.012-0.2 % Soln Commonly known as: CLEAR EYES REDNESS Place 1-2 drops into both eyes 4 (four) times daily as needed for irritation.   oxyCODONE 5 MG immediate release tablet Commonly known as: Oxy IR/ROXICODONE Take 1 tablet (5 mg total) by mouth every 4 (four) hours as needed for moderate pain or severe pain. What changed: how much to take   pantoprazole 40 MG tablet Commonly known as: PROTONIX Take 1 tablet (40 mg total) by mouth daily. Start taking on: October 31, 2019 Replaces: omeprazole 40 MG capsule   Preparation H 0.25-88.44 % suppository Generic drug: shark liver oil-cocoa butter Place 1 suppository rectally 2 (two) times daily.   vitamin C 250 MG tablet Commonly known as: ASCORBIC ACID Take 1,000 mg by mouth 2 (two) times daily.      Allergies  Allergen Reactions  . Decongestant [Pseudoephedrine Hcl] Other (See Comments)    Cause UTIs  . Ivp Dye [Iodinated Diagnostic Agents] Hives and Other (See Comments)    Urticaria   . Sulfa Antibiotics Swelling and Other (See Comments)    Tongue swells    The results of significant diagnostics from this hospitalization (including imaging, microbiology, ancillary and laboratory) are listed below for reference.    Significant Diagnostic Studies: Ct Head Wo Contrast  Result Date: 10/26/2019 CLINICAL DATA:  Altered level of consciousness EXAM: CT HEAD WITHOUT CONTRAST TECHNIQUE: Contiguous axial images were obtained from the base of the skull through the vertex without intravenous contrast. COMPARISON:  10/27/2010 FINDINGS: Brain: There is a hypodense lesion of the left cerebellar hemisphere (series 2, image 8). Vascular: No hyperdense vessel or unexpected calcification. Skull: Normal. Negative for fracture or focal lesion. Sinuses/Orbits: No  acute finding. Other: None. IMPRESSION: There is a hypodense lesion of the left cerebellar hemisphere (series 2, image 8), new compared to remote prior examination dated 10/27/2010 and suspicious for acute to subacute infarction. MRI may be used to further assess for acute diffusion restricting infarction. Electronically Signed   By: Eddie Candle M.D.   On: 10/26/2019 14:52   Mr Brain Wo Contrast  Addendum Date: 10/26/2019   ADDENDUM REPORT: 10/26/2019 23:40 ADDENDUM: Study discussed by telephone with Dr. Silas [place] on 10/26/2019 at 2334 hours. Electronically Signed   By: Herminio Heads.D.  On: 10/26/2019 23:40   Result Date: 10/26/2019 CLINICAL DATA:  48 year old male with altered level of consciousness. Ataxia. Abnormal hypodensity in the left cerebellum on head CT earlier today. EXAM: MRI HEAD WITHOUT CONTRAST TECHNIQUE: Multiplanar, multiecho pulse sequences of the brain and surrounding structures were obtained without intravenous contrast. COMPARISON:  Head CT 1438 hours today. FINDINGS: Brain: Confluent 2.5 centimeter area of restricted diffusion in the left cerebellar hemisphere corresponding to the CT hypodensity earlier today. Associated T2 and FLAIR hyperintensity, and possible mild petechial hemorrhage (series 8, image 12), but no mass effect. Additionally, there are much smaller scattered areas of restricted diffusion in the right cerebellar hemisphere (series 2, image 13). No brainstem restricted diffusion. However, there is also a small but confluent area of restricted diffusion in the left cingulate gyrus (series 2, image 33). T2 and FLAIR hyperintensity in those areas with no associated hemorrhage or mass effect. Underlying cerebral volume is normal. And outside of the acute areas gray and white matter signal appears within normal limits. No chronic cortical encephalomalacia or blood products. No midline shift, mass effect, evidence of mass lesion, ventriculomegaly, extra-axial collection.  Cervicomedullary junction and pituitary are within normal limits. Deep gray matter nuclei appear normal. Vascular: Major intracranial vascular flow voids are preserved. Skull and upper cervical spine: Negative visible cervical spine. Visualized bone marrow signal is within normal limits. Sinuses/Orbits: Negative orbits. Trace paranasal sinus mucosal thickening. Other: Trace retained secretions in the nasopharynx. Mastoids are clear. Visible internal auditory structures appear normal. Scalp and face soft tissues appear negative. IMPRESSION: 1. 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 This pattern of anterior and posterior circulation ischemia raises the possibility of a recent embolic event. 2. Possible petechial hemorrhage in the dominant left cerebellar infarct, but no malignant hemorrhagic transformation and no intracranial mass effect. 3. Normal background MRI appearance of the brain. Electronically Signed: By: Genevie Ann M.D. On: 10/26/2019 23:22   US Carotid Bilateral (at [facility] And [facility] Only)  Result Date: 10/28/2019 CLINICAL DATA:  Hypertension, stroke, diabetes EXAM: BILATERAL CAROTID DUPLEX ULTRASOUND TECHNIQUE: Pearline Cables scale imaging, color [name] and duplex ultrasound were performed of bilateral carotid and vertebral arteries in the neck. COMPARISON:  None. FINDINGS: Criteria: Quantification of carotid stenosis is based on velocity parameters that correlate the residual internal carotid diameter with NASCET-based stenosis levels, using the diameter of the distal internal carotid lumen as the denominator for stenosis measurement. The following velocity measurements were obtained: RIGHT ICA: 104/23 cm/sec CCA: 85/27 cm/sec SYSTOLIC ICA/CCA RATIO:  1.4 ECA: 72 cm/sec LEFT ICA: 112/55 cm/sec CCA: 78/24 cm/sec SYSTOLIC ICA/CCA RATIO:  1.4 ECA: 94 cm/sec RIGHT CAROTID  ARTERY: Minor echogenic shadowing plaque formation. No hemodynamically significant right ICA stenosis, velocity elevation, or turbulent flow. Degree of narrowing less than 50%. RIGHT VERTEBRAL ARTERY:  Antegrade LEFT CAROTID ARTERY: Similar scattered minor echogenic plaque formation. No hemodynamically significant left ICA stenosis, velocity elevation, or turbulent flow. LEFT VERTEBRAL ARTERY:  Antegrade IMPRESSION: Minor carotid atherosclerosis. No hemodynamically significant ICA stenosis. Degree of narrowing less than 50% bilaterally by ultrasound criteria. Patent antegrade vertebral flow bilaterally Electronically Signed   By: [name].  Shick M.D.   On: 10/28/2019 10:07   US Venous Img Lower Bilateral (dvt)  Result Date: 10/27/2019 CLINICAL DATA:  Bilateral lower extremity edema. 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 Evaluate for DVT. EXAM: BILATERAL LOWER EXTREMITY VENOUS  [name] ULTRASOUND TECHNIQUE: Gray-scale sonography with graded compression, as well as color [name] and duplex ultrasound were performed to evaluate the lower extremity deep venous systems from the level of the common femoral vein and including the common femoral, femoral, profunda femoral, popliteal and calf veins including the posterior tibial, peroneal and gastrocnemius veins when visible. The superficial great saphenous vein was also interrogated. Spectral [name] was utilized to evaluate flow at rest and with distal augmentation maneuvers in the common femoral, femoral and popliteal veins. COMPARISON:  None. FINDINGS: RIGHT LOWER EXTREMITY Common Femoral Vein: No evidence of thrombus. Normal compressibility, respiratory phasicity and response to augmentation. Saphenofemoral Junction: No evidence of thrombus. Normal compressibility and flow on color [name] imaging. Profunda Femoral Vein: No evidence of thrombus. Normal compressibility and flow on color [name] imaging. Femoral Vein: No evidence of thrombus. Normal  compressibility, respiratory phasicity and response to augmentation. Popliteal Vein: No evidence of thrombus. Normal compressibility, respiratory phasicity and response to augmentation. Calf Veins: No evidence of thrombus. Normal compressibility and flow on color [name] imaging. Superficial Great Saphenous Vein: No evidence of thrombus. Normal compressibility. Venous Reflux:  None. Other Findings:  None. LEFT LOWER EXTREMITY Common Femoral Vein: No evidence of thrombus. Normal compressibility, respiratory phasicity and response to augmentation. Saphenofemoral Junction: No evidence of thrombus. Normal compressibility and flow on color [name] imaging. Profunda Femoral Vein: No evidence of thrombus. Normal compressibility and flow on color [name] imaging. Femoral Vein: No evidence of thrombus. Normal compressibility, respiratory phasicity and response to augmentation. Popliteal Vein: No evidence of thrombus. Normal compressibility, respiratory phasicity and response to augmentation. Calf Veins: Appear patent where imaged. Superficial Great Saphenous Vein: No evidence of thrombus. Normal compressibility. Venous Reflux:  None. Other Findings:  None. IMPRESSION: No evidence of DVT within either lower extremity. Electronically Signed   By: Sandi Mariscal M.D.   On: 10/27/2019 12:07   [name] Complete Joint Space Structure Up Left  Result Date: 10/27/2019 CLINICAL DATA:  Status post repair of rotator cuff tear on 10/19/2019. EXAM: ULTRASOUND LEFT UPPER EXTREMITY COMPLETE TECHNIQUE: Ultrasound examination was performed including evaluation of the muscles, tendons, joint, and adjacent soft tissues. COMPARISON:  MRI dated 09/07/2019 FINDINGS: The visualized portions of the rotator cuff appear to be intact. No appreciable fluid in the subacromial or subdeltoid bursae. No appreciable glenohumeral joint effusion. IMPRESSION: The repaired rotator cuff appears to be intact. Electronically Signed   By: Lorriane Shire M.D.   On:  10/27/2019 16:15   Dg Chest Portable 1 View  Result Date: 10/26/2019 CLINICAL DATA:  Hypotension, DKA EXAM: PORTABLE CHEST 1 VIEW COMPARISON:  09/18/2015 FINDINGS: Heart and mediastinal contours are within normal limits. No focal opacities or effusions. No acute bony abnormality. IMPRESSION: No active disease. Electronically Signed   By: Rolm Baptise M.D.   On: 10/26/2019 02:30   [name] Or Nerve Block-image Only ([facility])  Result Date: 10/19/2019 There is no interpretation for this exam.  This order is for images obtained during a surgical procedure.  Please See "Surgeries" Tab for more information regarding the procedure.    Microbiology: Recent Results (from the past 240 hour(s))  SARS CORONAVIRUS 2 (TAT 6-24 HRS) Nasopharyngeal Nasopharyngeal Swab     Status: None   Collection Time: 10/23/19 10:08 AM   Specimen: Nasopharyngeal Swab  Result Value Ref Range Status   SARS Coronavirus 2 NEGATIVE NEGATIVE Final    Comment: (NOTE) SARS-CoV-2 target nucleic acids are NOT DETECTED. The SARS-CoV-2 RNA is generally detectable in upper and lower respiratory specimens  during the acute phase of infection. Negative results do not preclude SARS-CoV-2 infection, do not rule out co-infections with other pathogens, and should not be used as the sole basis for treatment or other patient management decisions. Negative results must be combined with clinical observations, patient history, and epidemiological information. The expected result is Negative. Fact Sheet for Patients: SugarRoll.be Fact Sheet for Healthcare Providers: https://www.woods-mathews.com/ This test is not yet approved or cleared by the [place] FDA and  has been authorized for detection and/or diagnosis of SARS-CoV-2 by FDA under an Emergency Use Authorization (EUA). This EUA will remain  in effect (meaning this test can be used) for the duration of the COVID-19 declaration under Section  56 4(b)(1) of the Act, 21 U.S.C. section 360bbb-3(b)(1), unless the authorization is terminated or revoked sooner. Performed at [facility], [place] [address], [place], [place] [address]   Urine culture     Status: Abnormal   Collection Time: 10/26/19  2:39 AM   Specimen: Urine, Random  Result Value Ref Range Status   Specimen Description   Final    URINE, RANDOM Performed at [facility], [address], [place], [place] [address]    Special Requests   Final    NONE Performed at [facility], [address] [place], [place] [address]    Culture (A)  Final    <10,000 COLONIES/mL INSIGNIFICANT GROWTH Performed at [facility], [place] [address] [place], [place] [address]    Report Status 10/26/2019 FINAL  Final  SARS CORONAVIRUS 2 (TAT 6-24 HRS) Nasopharyngeal Nasopharyngeal Swab     Status: None   Collection Time: 10/26/19  2:39 AM   Specimen: Nasopharyngeal Swab  Result Value Ref Range Status   SARS Coronavirus 2 NEGATIVE NEGATIVE Final    Comment: (NOTE) SARS-CoV-2 target nucleic acids are NOT DETECTED. The SARS-CoV-2 RNA is generally detectable in upper and lower respiratory specimens during the acute phase of infection. Negative results do not preclude SARS-CoV-2 infection, do not rule out co-infections with other pathogens, and should not be used as the sole basis for treatment or other patient management decisions. Negative results must be combined with clinical observations, patient history, and epidemiological information. The expected result is Negative. Fact Sheet for Patients: SugarRoll.be Fact Sheet for Healthcare Providers: https://www.woods-mathews.com/ This test is not yet approved or cleared by the [place] FDA and  has been authorized for detection and/or diagnosis of SARS-CoV-2 by FDA under an Emergency Use Authorization (EUA). This EUA will remain  in effect (meaning this test  can be used) for the duration of the COVID-19 declaration under Section 56 4(b)(1) of the Act, 21 U.S.C. section 360bbb-3(b)(1), unless the authorization is terminated or revoked sooner. Performed at [facility], [place] [address], [place], [place] [address]   CULTURE, BLOOD (ROUTINE X 2) w Reflex to ID Panel     Status: None (Preliminary result)   Collection Time: 10/26/19  8:52 AM   Specimen: BLOOD  Result Value Ref Range Status   Specimen Description BLOOD RIGHT AC  Final   Special Requests   Final    BOTTLES DRAWN AEROBIC AND ANAEROBIC Blood Culture results may not be optimal due to an excessive volume of blood received in culture bottles   Culture   Final    NO GROWTH 4 DAYS Performed at [facility], [address], [place], [place] [address]    Report Status PENDING  Incomplete  CULTURE, BLOOD (ROUTINE X  2) w Reflex to ID Panel     Status: None (Preliminary result)   Collection Time: 10/26/19  8:52 AM   Specimen: BLOOD  Result Value Ref Range Status   Specimen Description BLOOD LEFT HAND  Final   Special Requests   Final    BOTTLES DRAWN AEROBIC AND ANAEROBIC Blood Culture adequate volume   Culture   Final    NO GROWTH 4 DAYS Performed at [facility], [address], [place], [place] [address]    Report Status PENDING  Incomplete  MRSA PCR Screening     Status: None   Collection Time: 10/26/19  2:28 PM   Specimen: Nasal Mucosa; Nasopharyngeal  Result Value Ref Range Status   MRSA by PCR NEGATIVE NEGATIVE Final    Comment:        The GeneXpert MRSA Assay (FDA approved for NASAL specimens only), is one component of a comprehensive MRSA colonization surveillance program. It is not intended to diagnose MRSA infection nor to guide or monitor treatment for MRSA infections. Performed at [facility], [address] [place], [place] [address]      Labs: Basic Metabolic Panel: Recent Labs  Lab 10/26/19 0119  10/26/19 2219  10/27/19 8333 10/28/19 0500 10/29/19 0431 10/30/19 0428  NA 126*   < > 132* 134* 131* 131* 131*  K 5.2*   < > 3.2* 3.3* 3.9 3.8 3.8  CL 90*   < > 102 102 100 97* 90*  CO2 11*   < > 21* 23 24 24 30   GLUCOSE 536*   < > 167* 114* 217* 152* 170*  BUN 31*   < > 21* 17 12 8 10   CREATININE 1.56*   < > 0.65 0.59* 0.67 0.59* 0.58*  CALCIUM 8.4*   < > 8.2* 8.2* 8.0* 8.4* 8.9  MG 1.5*  --   --  1.7 1.9 1.7 1.8  PHOS 5.1*  --   --  3.1 2.9 2.8  --    < > = values in this interval not displayed.   Liver Function Tests: Recent Labs  Lab 10/26/19 0852 10/27/19 0613 10/28/19 0500 10/29/19 0431 10/30/19 0428  AST 29 25 28  33 27  ALT 20 17 16 17 19   ALKPHOS 87 68 59 83 91  BILITOT 1.4* 0.6 0.4 0.6 0.8  PROT 6.6 5.5* 5.0* 5.9* 6.5  ALBUMIN 3.7 3.0* 2.7* 3.2* 3.5   No results for input(s): LIPASE, AMYLASE in the last 168 hours. Recent Labs  Lab 10/26/19 0852  AMMONIA 16   CBC: Recent Labs  Lab 10/26/19 0119 10/27/19 0613 10/30/19 0428  WBC 6.5 7.1 6.8  NEUTROABS  --  5.8 4.9  HGB 10.5* 10.1* 12.9*  HCT 31.8* 29.1* 36.4*  MCV 97.5 93.0 90.3  PLT 188 235 275   Cardiac Enzymes: No results for input(s): CKTOTAL, CKMB, CKMBINDEX, TROPONINI in the last 168 hours. BNP: BNP (last 3 results) No results for input(s): BNP in the last 8760 hours.  ProBNP (last 3 results) No results for input(s): PROBNP in the last 8760 hours.  CBG: Recent Labs  Lab 10/30/19 0544 10/30/19 0801 10/30/19 0927 10/30/19 1016 10/30/19 1204  GLUCAP 149* 133* 152* 171* 182*    Principal Problem:   DKA (diabetic ketoacidoses) ([place]) Active Problems:   Anemia   Decubitus ulcer of back   ARF (acute renal failure) (HCC)   Embolic stroke ([place])   Time coordinating discharge: 38 minutes.  Signed:  Vinh Sachs, DO [facility]  10/30/2019, 2:30 PM

## 2019-10-30 NOTE — Progress Notes (Signed)
*  PRELIMINARY RESULTS* Echocardiogram Echocardiogram Transesophageal has been performed.  [name] 10/30/2019, 10:23 AM

## 2019-10-30 NOTE — Progress Notes (Signed)
PROGRESS NOTE  [name] [id number] DOB: 01-01-1971 DOA: 10/26/2019 PCP: Einar Pheasant, MD  Brief History   The patient is a 48 y.o.malewithhistory of diabetes mellitus type 1 with quadriplegia and autonomic dysreflexia who had a recent left shoulder surgery and discharged back to skilled nursing facility was found to be having elevated blood sugar with more than 500 and confused. Most of the history was obtained from ER physician. Not able to reach patient's family. Patient did not have any nausea vomiting diarrhea chest pain or shortness of breath fever or chills. After admission patient remained confused for the past week. His respiratory condition worsened as well requiring CCM evaluation. Mentation improved rapidly.  CT head and MRI brain shows possibility of acute stroke.  Neurology, CCM and cardiology consulted.  MRI has confirmed left cerebellar infarct, smaller right cerebellar infarct, and left ACA territory infarct. Carotid dopplers demonstrated no hemodynamically significant stenosis. TTE and TEE did not demonstrate intracardiac source of emboli. EF was normal.   The patient will be discharged on ASA.  Cardiology has set the patient up with Zio monitor.  Consultants  . Neurology . Cardiology . Orthopedics  Procedures  . None  Antibiotics   Anti-infectives (From admission, onward)   Start     Dose/Rate Route Frequency Ordered Stop   10/28/19 1115  methenamine (MANDELAMINE) tablet 1,000 mg     1,000 mg Oral 2 times daily 10/28/19 1110     10/26/19 0630  cefTRIAXone (ROCEPHIN) 1 g in sodium chloride 0.9 % 100 mL IVPB  Status:  Discontinued     1 g 200 mL/hr over 30 Minutes Intravenous Every 24 hours 10/26/19 0622 10/28/19 1108    .   Subjective  The patient is resting comfortably. He is complaining of back pain.  Objective   Vitals:  Vitals:   10/30/19 1207 10/30/19 1633  BP: (!) 118/92   Pulse: (!) 101   Resp: 16   Temp: 98.5 F (36.9  C) 99.6 F (37.6 C)  SpO2: 98%    Exam:  Constitutional:  . The patient is awake, alert, and oriented x 3. No acute distress. Respiratory:  . No increased work of breathing. . No wheezes, rales, or rhonchi . No tactile fremitus Cardiovascular:  . Regular rate and rhythm . No murmurs, ectopy, or gallups. . No lateral PMI. No thrills. Abdomen:  . Abdomen is soft, non-tender, non-distended . No hernias, masses, or organomegaly . Normoactive bowel sounds.  Musculoskeletal:  . No cyanosis, clubbing, or edema Skin:  . No rashes, lesions, ulcers . palpation of skin: no induration or nodules Neurologic:  . CN 2-12 intact Psychiatric:  . Mental status o Mood, affect appropriate o Orientation to person, place, time  . judgment and insight appear intact  I have personally reviewed the following:   Today's Data  . Vitals . CBC, CMP  Micro Data  . Blood cultures x 2 have had no growth.  Imaging  . CT head, MRI, Carotid doppler  Cardiology Data  TTE, TEE,   Scheduled Meds: .  stroke: mapping our early stages of recovery book   Does not apply Once  . aspirin  300 mg Rectal Daily   Or  . aspirin  325 mg Oral Daily  . bupivacaine  5 mL Infiltration Once  . busPIRone  5 mg Oral BID  . butamben-tetracaine-benzocaine      . Chlorhexidine Gluconate Cloth  6 each Topical Daily  . enoxaparin (LOVENOX) injection  40 mg Subcutaneous  Q24H  . fentaNYL      . insulin aspart  2-15 Units Subcutaneous TID PC  . insulin detemir  15 Units Subcutaneous QHS  . lidocaine      . methenamine  1,000 mg Oral BID  . midazolam      . pantoprazole  40 mg Oral Daily  . sodium chloride flush      . sucralfate  1 g Oral TID AC  . vitamin C  1,000 mg Oral BID   Continuous Infusions:  Principal Problem:   DKA (diabetic ketoacidoses) (HCC) Active Problems:   Anemia   Decubitus ulcer of back   ARF (acute renal failure) ([place])   Embolic stroke (HCC)   LOS: 4 days   A & P  Diabetic  ketoacidosis in type I diabetic:  Resolved. Plan is for the patient to be on insulin pump as outpatient. Currently the patient's glucoses are managed via Lantus with sliding scale insulin, although patient would like to continue to manage his insulin the way he manages at home and not per our sliding scale protocol.  Elevated troponin: Patient does not have any chest pain or EKG changes to suggest any ischemic event. Cardiology consulted. TTE and TEE show no ischemic changes. Suspect patient's hypotension as well as bradycardia is likely secondary to autonomic dysfunction. Started on midodrine per cardiology.  Acute CVA: MRI has confirmed left cerebellar infarct, smaller right cerebellar infarct, and left ACA territory infarct.  Carotid dopplers demonstrated no hemodynamically significant stenosis. TTE and TEE did not demonstrate intracardiac source of emboli. EF was normal. Patient will be discharged on ASA and a Zio monitor per cardiology.  Acute metabolic encephalopathy: Resolved. Neurology consulted for further assistance. Patient was unresponsive yesterday to sternal rub. EEG proved to show any evidence of epileptogenic foci. Acute left cerebellar infarct, smaller right cerebellar infarct, and left ACA territory infarct on MRI. Stroke work up negative.  Anemia: Hemoglobin improved from 10.1 to 12.9 today. Monitor.    Recent left shoulder surgery for rotator cuff. Appreciate Dr. Nicholaus Bloom assistance.  History of paraplegia with autonomic dysreflexia takes fludrocortisone as needed. Patient was initially hypotensive improved with fluids. Does not look septic however patient was place patient on ceftriaxone for possible UTI, discontinue to biotic  Pressure Injury 10/19/19 Coccyx Unstageable - Full thickness tissue loss in which the base of the ulcer is covered by slough (yellow, tan, gray, green or brown) and/or eschar (tan, brown or black) in the wound bed. (Active)  10/19/19 1730   Location: Coccyx  Location Orientation:   Staging: Unstageable - Full thickness tissue loss in which the base of the ulcer is covered by slough (yellow, tan, gray, green or brown) and/or eschar (tan, brown or black) in the wound bed.  Wound Description (Comments):   Present on Admission: Yes    DVT Prophylaxis: Subcutaneous Lovenox  CODE STATUS: DNR Family Communication: Family at bedside. All questions answered to the best of my ability. Disposition: Discharge to be SNF pending insurance authorization.    Ennifer Harston, DO [facility] Direct contact: see [email or web address]  7PM-7AM contact night coverage as above 10/30/2019, 6:18 PM  LOS: 4 days

## 2019-10-30 NOTE — Progress Notes (Signed)
PT Cancellation Note  Patient Details Name: [name] MRN: [id number] DOB: Jan 25, 1971   Cancelled Treatment:    Reason Eval/Treat Not Completed: Other (comment)(Pt out of room at this time. PT to follow up as able)   Lieutenant Diego PT, DPT 10:27 AM,10/30/19 [phone]

## 2019-10-30 NOTE — TOC Progression Note (Signed)
Transition of Care [facility]) - Progression Note    Patient Details  Name: [name] MRN: [id number] Date of Birth: 1971-11-28  Transition of Care [facility]) CM/SW [place], LCSW Phone Number: 10/30/2019, 3:59 PM  Clinical Narrative:   CSW following patient for support and discharge needs. Patient will need a covid test and authorization through insurance prior to discharge. MD made aware. Authorization request faxed to [facility].  CSW spoke with Tammy from [facility] they stated they can take patient back as long as patient is agreeable to allow facility to administer his insulin for him. CSW spoke to patient with RN present and he stated he will allow the facility to administer his insulin for him. Patient can return back to facility tomorrow once authorization and covid test is obtained          Expected Discharge Plan and Services           Expected Discharge Date: 10/30/19                                     Social Determinants of Health (SDOH) Interventions    Readmission Risk Interventions No flowsheet data found.

## 2019-10-30 NOTE — Telephone Encounter (Signed)
Called pt.  Unable to reach.  Had to leave a message.  Do you mind trying again and seeing if you can get in touch with him.  Thanks

## 2019-10-30 NOTE — Progress Notes (Signed)
Subjective: The patient has no new complaints regarding his left shoulder.  His pain is under good control.  He has not been running any fevers or chills.  The patient's primary concern is recurrent painful catching of his left ring finger.  The patient has a history of a left ring trigger finger which has been injected previously with temporary alleviation of his symptoms.  He requests that a repeat injection be performed.  The patient also notes increased right shoulder pain.  The patient is status post a right shoulder arthroscopy with debridement, decompression, and a biceps tenolysis.  He notes increased discomfort in the shoulder which he attributes to having to depend more on his right shoulder while recuperating from his recent left shoulder surgery.  He requests that a steroid injection be administered into the right shoulder to help alleviate these symptoms.   Objective: Vital signs in last 24 hours: Temp:  [98.4 F (36.9 C)-98.8 F (37.1 C)] 98.6 F (37 C) (11/23 0537) Pulse Rate:  [88-107] 88 (11/23 0537) Resp:  [16-20] 16 (11/23 0537) BP: (100-168)/(77-117) 100/77 (11/23 0537) SpO2:  [92 %-99 %] 97 % (11/23 0537)  Intake/Output from previous day: 11/22 0701 - 11/23 0700 In: 1000 [P.O.:1000] Out: 3200 [Urine:3200] Intake/Output this shift: No intake/output data recorded.  Recent Labs    10/30/19 0428  HGB 12.9*   Recent Labs    10/30/19 0428  WBC 6.8  RBC 4.03*  HCT 36.4*  PLT 275   Recent Labs    10/29/19 0431 10/30/19 0428  NA 131* 131*  K 3.8 3.8  CL 97* 90*  CO2 24 30  BUN 8 10  CREATININE 0.59* 0.58*  GLUCOSE 152* 170*  CALCIUM 8.4* 8.9   Recent Labs    10/30/19 0428  INR 1.0    Physical Exam: Left shoulder:  Wounds appear to be healing well and are without evidence for infection.  No tenderness to palpation over anterior lateral aspects of shoulder.    Left hand:  Skin inspection is unremarkable.  No swelling, erythema, ecchymosis,  abrasions, or other skin abnormalities are identified.  The patient is able to actively flex and extend all digits without pain, but there is an obvious reproducible triggering of the ring finger.  He is neurovascularly unchanged versus preoperatively.  Right shoulder: Skin inspection is notable for well-healed arthroscopic portal sites, but otherwise is unremarkable.  No swelling, erythema, ecchymosis, abrasions, or other skin abnormalities are identified.  He has mild tenderness to palpation over the anterolateral aspect of the shoulder.  He has pain with active forward flexion and abduction, and has a mildly positive impingement sign.   Assessment: 1.  Status post repair of massive rotator cuff tear, left shoulder. 2.  Impingement/tendinopathy of right shoulder status post prior decompression and biceps tenolysis. 3.  Left ring trigger finger.  Plan: The treatment options for each of these problems have been discussed with the patient.  The patient was scheduled to come back to see me today for his first postoperative visit regarding his left shoulder.  Therefore, I will ask the nurse to remove the staples from his left shoulder.  He is to remain in his shoulder immobilizer at all times, removing it only for bathing purposes.  He may begin gentle passive range of motion exercises of the left shoulder with physical therapy in another 2 weeks.  Meanwhile, he can work with occupational therapy on range of motion and strength exercises for his wrist, fingers, and elbow.  Regarding  his left ring trigger finger symptoms, the patient requests and is given a repeat injection into the left ring flexor sheath.  After obtaining verbal consent, this injection is performed sterilely using 0.25 cc of Kenalog 40 (10 mg) and 1 cc of 0.5% Sensorcaine.  The patient tolerated the procedure well.  Regarding his right shoulder symptoms, the patient requests and is given an injection into the right subacromial space.   After obtaining verbal consent, this injection is performed sterilely using 1 cc of Kenalog-40 (40 mg) and 4 cc of 0.5% Sensorcaine.  Again the patient tolerated the procedure well.  Thank you for allowing me to participate in the care of this most pleasant yet unfortunate man.  We will plan on seeing him back in the office in 1 month as scheduled.   Marshall Cork Poggi 10/30/2019, 8:00 AM

## 2019-10-30 NOTE — CV Procedure (Signed)
TEE was performed without complications.  It showed normal LV systolic function with no evidence of intracardiac thrombus or PFO.  No cardiac source of embolism is identified.  Full report to follow. During this procedure the patient is administered a total of Versed 3 mg and Fentanyl 50 mcg to achieve and maintain moderate conscious sedation.  The patient's heart rate, blood pressure, and oxygen saturation are monitored continuously during the procedure. The period of conscious sedation is 13 minutes, of which I was present face-to-face 100% of this time.

## 2019-10-31 ENCOUNTER — Encounter: Payer: Self-pay | Admitting: Cardiovascular Disease

## 2019-10-31 LAB — GLUCOSE, CAPILLARY
Glucose-Capillary: 210 mg/dL — ABNORMAL HIGH (ref 70–99)
Glucose-Capillary: 245 mg/dL — ABNORMAL HIGH (ref 70–99)
Glucose-Capillary: 113 mg/dL — ABNORMAL HIGH (ref 70–99)
Glucose-Capillary: 153 mg/dL — ABNORMAL HIGH (ref 70–99)
Glucose-Capillary: 158 mg/dL — ABNORMAL HIGH (ref 70–99)

## 2019-10-31 LAB — FACTOR 5 LEIDEN

## 2019-10-31 LAB — SARS CORONAVIRUS 2 (TAT 6-24 HRS): SARS Coronavirus 2: NEGATIVE

## 2019-10-31 MED ORDER — INSULIN ASPART 100 UNIT/ML ~~LOC~~ SOLN: 0.0000 [IU] | Freq: Every day | SUBCUTANEOUS | Status: DC

## 2019-10-31 MED ORDER — POLYETHYLENE GLYCOL 3350 17 G PO PACK
17.0000 g | PACK | Freq: Every day | ORAL | Status: DC
  Administered 2019-10-31: 17 g via ORAL
  Filled 2019-10-31 (×2): qty 1

## 2019-10-31 MED ORDER — METOPROLOL TARTRATE 5 MG/5ML IV SOLN
5.0000 mg | Freq: Once | INTRAVENOUS | Status: AC
  Administered 2019-10-31: 5 mg via INTRAVENOUS
  Filled 2019-10-31: qty 5

## 2019-10-31 MED ORDER — METOPROLOL TARTRATE 25 MG PO TABS
25.0000 mg | ORAL_TABLET | Freq: Two times a day (BID) | ORAL | Status: DC
  Administered 2019-10-31 – 2019-11-01 (×3): 25 mg via ORAL
  Filled 2019-10-31 (×3): qty 1

## 2019-10-31 MED ORDER — INSULIN ASPART 100 UNIT/ML ~~LOC~~ SOLN
3.0000 [IU] | Freq: Three times a day (TID) | SUBCUTANEOUS | Status: DC
  Administered 2019-10-31 – 2019-11-01 (×4): 3 [IU] via SUBCUTANEOUS
  Filled 2019-10-31 (×5): qty 1

## 2019-10-31 MED ORDER — HYDRALAZINE HCL 25 MG PO TABS: 25.0000 mg | ORAL_TABLET | Freq: Three times a day (TID) | ORAL | 0 refills | Status: DC

## 2019-10-31 MED ORDER — HYDRALAZINE HCL 25 MG PO TABS
25.0000 mg | ORAL_TABLET | Freq: Three times a day (TID) | ORAL | Status: DC
  Administered 2019-10-31 – 2019-11-01 (×4): 25 mg via ORAL
  Filled 2019-10-31 (×4): qty 1

## 2019-10-31 MED ORDER — INSULIN DETEMIR 100 UNIT/ML ~~LOC~~ SOLN
22.0000 [IU] | Freq: Every day | SUBCUTANEOUS | Status: DC
  Administered 2019-10-31: 22 [IU] via SUBCUTANEOUS
  Filled 2019-10-31 (×2): qty 0.22

## 2019-10-31 MED ORDER — METOPROLOL TARTRATE 25 MG PO TABS: 25.0000 mg | ORAL_TABLET | Freq: Two times a day (BID) | ORAL | 0 refills | Status: DC

## 2019-10-31 MED ORDER — INSULIN ASPART 100 UNIT/ML ~~LOC~~ SOLN
0.0000 [IU] | Freq: Three times a day (TID) | SUBCUTANEOUS | Status: DC
  Administered 2019-10-31: 2 [IU] via SUBCUTANEOUS
  Filled 2019-10-31 (×2): qty 1

## 2019-10-31 NOTE — Progress Notes (Signed)
PROGRESS NOTE  [name] RXV:[id number] DOB: 02-17-1971 DOA: 10/26/2019 PCP: Einar Pheasant, MD  Brief History   The patient is a 48 y.o.malewithhistory of diabetes mellitus type 1 with quadriplegia and autonomic dysreflexia who had a recent left shoulder surgery and discharged back to skilled nursing facility was found to be having elevated blood sugar with more than 500 and confused. Most of the history was obtained from ER physician. Not able to reach patient's family. Patient did not have any nausea vomiting diarrhea chest pain or shortness of breath fever or chills. After admission patient remained confused for the past week. His respiratory condition worsened as well requiring CCM evaluation. Mentation improved rapidly.  CT head and MRI brain shows possibility of acute stroke.  Neurology, CCM and cardiology consulted.  MRI has confirmed left cerebellar infarct, smaller right cerebellar infarct, and left ACA territory infarct. Carotid dopplers demonstrated no hemodynamically significant stenosis. TTE and TEE did not demonstrate intracardiac source of emboli. EF was normal.   The patient will be discharged on ASA.  Cardiology has set the patient up with Zio monitor.  Consultants  . Neurology . Cardiology . Orthopedics  Procedures  . None  Antibiotics   Anti-infectives (From admission, onward)   Start     Dose/Rate Route Frequency Ordered Stop   10/28/19 1115  methenamine (MANDELAMINE) tablet 1,000 mg     1,000 mg Oral 2 times daily 10/28/19 1110     10/26/19 0630  cefTRIAXone (ROCEPHIN) 1 g in sodium chloride 0.9 % 100 mL IVPB  Status:  Discontinued     1 g 200 mL/hr over 30 Minutes Intravenous Every 24 hours 10/26/19 0622 10/28/19 1108      Subjective  The patient is resting comfortably. No new complaints.   Objective   Vitals:  Vitals:   10/31/19 1158 10/31/19 1159  BP: (!) 168/104 133/89  Pulse: (!) 103 99  Resp:    Temp: (!) 97.5 F (36.4 C)    SpO2: 99% 100%   Exam:  Constitutional:  . The patient is awake, alert, and oriented x 3. No acute distress. Respiratory:  . No increased work of breathing. . No wheezes, rales, or rhonchi . No tactile fremitus Cardiovascular:  . Regular rate and rhythm . No murmurs, ectopy, or gallups. . No lateral PMI. No thrills. Abdomen:  . Abdomen is soft, non-tender, non-distended . No hernias, masses, or organomegaly . Normoactive bowel sounds.  Musculoskeletal:  . No cyanosis, clubbing, or edema Skin:  . No rashes, lesions, ulcers . palpation of skin: no induration or nodules Neurologic:  . CN 2-12 intact Psychiatric:  . Mental status o Mood, affect appropriate o Orientation to person, place, time  . judgment and insight appear intact  I have personally reviewed the following:   Today's Data  . Vitals . CBC, CMP  Micro Data  . Blood cultures x 2 have had no growth.  Imaging  . CT head, MRI, Carotid doppler  Cardiology Data  TTE, TEE,   Scheduled Meds: .  stroke: mapping our early stages of recovery book   Does not apply Once  . aspirin  300 mg Rectal Daily   Or  . aspirin  325 mg Oral Daily  . bupivacaine  5 mL Infiltration Once  . busPIRone  5 mg Oral BID  . Chlorhexidine Gluconate Cloth  6 each Topical Daily  . enoxaparin (LOVENOX) injection  40 mg Subcutaneous Q24H  . hydrALAZINE  25 mg Oral Q8H  . insulin  aspart  0-5 Units Subcutaneous QHS  . insulin aspart  0-9 Units Subcutaneous TID WC  . insulin aspart  3 Units Subcutaneous TID WC  . insulin detemir  22 Units Subcutaneous QHS  . methenamine  1,000 mg Oral BID  . metoprolol tartrate  25 mg Oral BID  . pantoprazole  40 mg Oral Daily  . polyethylene glycol  17 g Oral Daily  . sucralfate  1 g Oral TID AC  . vitamin C  1,000 mg Oral BID   Continuous Infusions:  Principal Problem:   DKA (diabetic ketoacidoses) (HCC) Active Problems:   Anemia   Decubitus ulcer of back   ARF (acute renal failure)  ([place])   Embolic stroke (HCC)   LOS: 5 days   A & P  Diabetic ketoacidosis in type I diabetic:  Resolved. Plan is for the patient to be on insulin pump as outpatient. Currently the patient's glucoses are managed via Lantus with sliding scale insulin. Diabetic coordinator has been consulted to discuss diabetic management with the patient.  Elevated troponin: Patient does not have any chest pain or EKG changes to suggest any ischemic event. Cardiology consulted. TTE and TEE show no ischemic changes. Suspect patient's hypotension as well as bradycardia is likely secondary to autonomic dysfunction. Started on midodrine per cardiology, however, the patient was very hypertensive overnight. I have stopped it.  Acute CVA: MRI has confirmed left cerebellar infarct, smaller right cerebellar infarct, and left ACA territory infarct.  Carotid dopplers demonstrated no hemodynamically significant stenosis. TTE and TEE did not demonstrate intracardiac source of emboli. EF was normal. Patient will be discharged on ASA and a Zio monitor per cardiology.  Acute metabolic encephalopathy: Resolved. Neurology consulted for further assistance. Patient was unresponsive yesterday to sternal rub. EEG proved to show any evidence of epileptogenic foci. Acute left cerebellar infarct, smaller right cerebellar infarct, and left ACA territory infarct on MRI. Stroke work up negative.  Anemia: Hemoglobin improved from 10.1 to 12.9 today. Monitor.    Recent left shoulder surgery for rotator cuff. Appreciate Dr. Nicholaus Bloom assistance. PT/OT has been consulted.  History of paraplegia with autonomic dysreflexia takes fludrocortisone as needed. Patient was initially hypotensive improved with fluids. Does not look septic however patient was placedon ceftriaxone for possible UTI. Antibiotics now discontinued.  Pressure Injury 10/19/19 Coccyx Unstageable - Full thickness tissue loss in which the base of the ulcer is covered by slough  (yellow, tan, gray, green or brown) and/or eschar (tan, brown or black) in the wound bed. (Active)  10/19/19 1730  Location: Coccyx  Location Orientation:   Staging: Unstageable - Full thickness tissue loss in which the base of the ulcer is covered by slough (yellow, tan, gray, green or brown) and/or eschar (tan, brown or black) in the wound bed.  Wound Description (Comments):   Present on Admission: Yes    DVT Prophylaxis: Subcutaneous Lovenox  CODE STATUS: DNR Family Communication: Family at bedside. All questions answered to the best of my ability. Disposition: Discharge to be SNF pending insurance authorization.  Duc Crocket, DO [facility] Direct contact: see [email or web address]  7PM-7AM contact night coverage as above 10/31/2019, 3:51 PM  LOS: 4 days

## 2019-10-31 NOTE — Progress Notes (Signed)
Called [facility] regarding patient's heart rate of 138 and blood pressure of 162/110 manually. Patient has uncontrolled muscle spasms due to quadriplegia. Orders were placed for metoprolol IV 59m once. Will continue to monitor.  CChristene Slates

## 2019-10-31 NOTE — TOC Progression Note (Addendum)
Transition of Care [facility]) - Progression Note    Patient Details  Name: [name] MRN: [id number] Date of Birth: [birth year]  [facility]) CM/SW Contact  Beverly Sessions, RN Phone Number: 10/31/2019, 4:38 PM  Clinical Narrative:     RNCM confirmed with patient he wishes to return to [facility]   RNCM reached out to [place] at 1019  They state they did not receive clinical to initiate authorization.  This RNCM initiated auth. Reference # [id number].  Clinical faxed  RNCM received auth at 1436.  RNCM reached out to [place] at [facility] to notify that auth at been received.  She states the admission nurse has left for the day, and admission will have to be tomorrow.  MD, and RN notified .  Avoidable days entered    Auth # [id number] Good for 7 days.  11/24-11/30.  Case worker Dollene Cleveland        Expected Discharge Plan and Services           Expected Discharge Date: 10/31/19                                     Social Determinants of Health (SDOH) Interventions    Readmission Risk Interventions No flowsheet data found.

## 2019-10-31 NOTE — Telephone Encounter (Signed)
Pt is still in hospital.  Would recommend seeing if they can get an insulin pump while he is in the hospital.  If we do as an outpatient will need a referral to endocrinology.  Can see if he is agreeable to this and if ok to go to [facility] if necessary.

## 2019-10-31 NOTE — Progress Notes (Signed)
PT Cancellation Note  Patient Details Name: [name] MRN: [id number] DOB: Dec 29, 1970   Cancelled Treatment:    Reason Eval/Treat Not Completed: Other (comment)(Per chart pt has had elevated HR and BP this AM. Per PT guidelines PT to hold exertional activities until patient is more medically appropriate. PT to follow up as able.)   Lieutenant Diego PT, DPT 8:55 AM,10/31/19 [phone]

## 2019-10-31 NOTE — Telephone Encounter (Signed)
Patient is wanting to have the pump placed before he goes back into rehab for 2 months , due to he feels thats why he ended up in the hospital his CBG's were not monitored or handled appropriately. I advised patient to also voice his concerns to the hospitalist and DC case manager before he discharged back to [facility].

## 2019-10-31 NOTE — Progress Notes (Signed)
Physical Therapy Treatment Patient Details Name: [name] MRN: [id number] DOB: [birth year] Today's Date: 10/31/2019    History of Present Illness Pt is a 48 y.o. male presenting to hospital 10/26/19 with elevated blood sugar (more than 500) and AMS.  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  Of note, pt s/p L rotator cuff repair 10/19/19 (discharged from hospital 10/25/19).  PMH includes C6/C7 incomplete quadriplegia, DM type I, autonomic dysreflexia, neurogenic bladder, h/o L knee surgery; pt also reports h/o L hip dislocation/issues s/p L knee surgery.    PT Comments    Patient alert, in bed, HOB elevated so patient is nearly sitting. Upon entering the room, pt agreeable to some stretching and PROM for his lower extremities, hesitant to attempt transfers at this time due to recently becoming comfortable in bed. Session focused on LE PROM as well as PROM of L elbow and wrist. PT educated about wrist and finger motion per precautions. 1 instance of L shoulder pain when pt attempted wrist circumduction, educated on importance of not moving L shoulder. Pt in bed with all needs in reach.  BP and HR assessed throughout session. Initially BP 117/72 HR 98. During session 180/105 HR 109, and at end of session 155/93 HR 115.     Follow Up Recommendations  SNF     Equipment Recommendations  Other (comment)(hoyer lift, single arm wheel chair)    Recommendations for Other Services OT consult     Precautions / Restrictions Precautions Precautions: Shoulder;Fall Type of Shoulder Precautions: Per Ortho: He is to remain in his shoulder immobilizer at all times, removing it only for bathing purposes.  He  may begin gentle passive range of motion exercises of the left shoulder with physical therapy in another 2 weeks. (2wks from 11/23). Meanwhile, he can work with occupational therapy on range of motion and strength exercises for his wrist, fingers, and elbow. Precaution Booklet Issued: No Restrictions Weight Bearing Restrictions: Yes LUE Weight Bearing: Non weight bearing RLE Weight Bearing: Non weight bearing LLE Weight Bearing: Non weight bearing Other Position/Activity Restrictions: No L hip aBduction ROM (per pt request d/t concerns for dislocation)    Mobility  Bed Mobility               General bed mobility comments: deferred, pt reported that he was finally in a good comfortable spot, did not want to mobilize this session  Transfers                    Ambulation/Gait                 Stairs             Wheelchair Mobility    Modified Rankin (Stroke Patients Only)       Balance                                            Cognition Arousal/Alertness: Awake/alert Behavior During Therapy: WFL for tasks assessed/performed Overall Cognitive Status: Within Functional Limits for tasks assessed  Exercises Other Exercises Other Exercises: session focused on PROM of bilateral LE. hip flexion, knee flexion, ankle DF/PF performed. Deferred hip abduction per patient preference. Other Exercises: PT performed PROM on LUE of L elbow flexion/extension. Pt performed AROM wrist flexion/extension Other Exercises: BP measured intermittently through session. Variable results. 155/93 HR 109 at end of session.    General Comments        Pertinent Vitals/Pain Pain Assessment: Faces Faces Pain Scale: Hurts little more Pain Location: L shoulder when attempted wrist circumduction. Pain Descriptors / Indicators: Grimacing;Moaning Pain Intervention(s): Limited activity within patient's  tolerance;Monitored during session;Premedicated before session;Repositioned    Home Living                      Prior Function            PT Goals (current goals can now be found in the care plan section) Progress towards PT goals: Progressing toward goals    Frequency    7X/week      PT Plan Current plan remains appropriate    Co-evaluation              AM-PAC PT "6 Clicks" Mobility   Outcome Measure  Help needed turning from your back to your side while in a flat bed without using bedrails?: A Lot Help needed moving from lying on your back to sitting on the side of a flat bed without using bedrails?: Total Help needed moving to and from a bed to a chair (including a wheelchair)?: Total Help needed standing up from a chair using your arms (e.g., wheelchair or bedside chair)?: Total Help needed to walk in hospital room?: Total Help needed climbing 3-5 steps with a railing? : Total 6 Click Score: 7    End of Session   Activity Tolerance: Patient tolerated treatment well Patient left: in bed;with call bell/phone within reach;with bed alarm set;Other (comment) Nurse Communication: Mobility status;Precautions;Other (comment) PT Visit Diagnosis: Other abnormalities of gait and mobility (R26.89);Muscle weakness (generalized) (M62.81);Pain Pain - Right/Left: Left Pain - part of body: Shoulder     Time: 6789-3810 PT Time Calculation (min) (ACUTE ONLY): 34 min  Charges:  $Therapeutic Exercise: 23-37 mins                     Lieutenant Diego PT, DPT 4:11 PM,10/31/19 [phone]

## 2019-10-31 NOTE — Progress Notes (Signed)
MD notified: This patient continuous to have high BP levels. BP is 193/132, HR 101 after receiving 79m of metoprolol at 0613. BP levels have been high all night 162/110, 189/151 and a HR of 138. Would you like to order additional BP meds or PRN BP meds.

## 2019-10-31 NOTE — Progress Notes (Addendum)
Inpatient Diabetes Program Recommendations  AACE/ADA: New Consensus Statement on Inpatient Glycemic Control (2015)  Target Ranges:  Prepandial:   less than 140 mg/dL      [facility] postprandial:   less than 180 mg/dL (1-2 hours)      Critically ill patients:  140 - 180 mg/dL   Lab Results  Component Value Date   GLUCAP 245 (H) 10/31/2019   HGBA1C 6.4 (H) 10/28/2019    Review of Glycemic Control Results for ORRY, [name] (MRN [id number]) as of 10/31/2019 10:25  Ref. Range 10/30/2019 14:57 10/30/2019 16:33 10/30/2019 21:21 10/31/2019 05:05 10/31/2019 07:57  Glucose-Capillary Latest Ref Range: 70 - 99 mg/dL 262 (H) No coverage noted 242 (H) novolog 15 units 265 (H) levemir 15units 210 (H) No coverage noted 245 (H) novolog  15units     Diabetes history: T1D Outpatient Diabetes medications: Levemir 29 units QHS; Humalog 1-6 TID with meals Current orders for Inpatient glycemic control: Levemir 15 units QHS; Novolog 0-15 TID with meals Inpatient Diabetes Program Recommendations:     NOTE:Patient received Triamcinolone Acetonide on 10-30-19  -Please consider increasing Levemir to 22 units QHS (80% of home dose) -Please consider decreasing to Novolog  0-9 TID with meals -Please consider add meal coverage Novolog 3 units if eats at least 50% of meals  @1100 -spoke with patient at bedside.  Patient came from [facility] and will likely be returning there at discharge.  DM consult placed per patient request.  Patient is asking about getting an insulin pump while inpatient.  He states he has heard if someone has a pump and goes to STR they are allowed to manage the pump.  He said they took all of his insulin away from him and he ended up in DKA/stroke.  Explained that getting a pump would be a good idea but that needs to be on an out patient basis and there is training involved.  He verbalized understanding.  MD adjusted orders per above request.  Called and spoke with  Chrissy at [employer] [employer].  She states that patient was administering his own insulin and didn't tell the nursing staff when he do so.  Chrissy states that he said he checks his CBG every 36mns-1hr and gives himself insulin.  They were not comfortable with that so they took his insulin away from him.    Called patient and asked about administering insulin every 371ms-1hr.  He states he checks his Dexcom before meals and gives carb coverage and then checks his BS about 3035m-1hr after meals and corrects if needed.  Will call Chrissy again and explain what patient stated.  Will continue to follow while in hospital.  Thank you, JenGeoffry ParadiseN, BSN Diabetes Coordinator Inpatient Diabetes Program [phone] pager from 8a-5p)

## 2019-11-01 ENCOUNTER — Telehealth: Payer: Self-pay | Admitting: Cardiovascular Disease

## 2019-11-01 DIAGNOSIS — M2550 Pain in unspecified joint: Secondary | ICD-10-CM | POA: Diagnosis not present

## 2019-11-01 DIAGNOSIS — K219 Gastro-esophageal reflux disease without esophagitis: Secondary | ICD-10-CM | POA: Diagnosis not present

## 2019-11-01 DIAGNOSIS — R410 Disorientation, unspecified: Secondary | ICD-10-CM | POA: Diagnosis not present

## 2019-11-01 DIAGNOSIS — F419 Anxiety disorder, unspecified: Secondary | ICD-10-CM | POA: Diagnosis present

## 2019-11-01 DIAGNOSIS — M6281 Muscle weakness (generalized): Secondary | ICD-10-CM | POA: Diagnosis not present

## 2019-11-01 DIAGNOSIS — G8250 Quadriplegia, unspecified: Secondary | ICD-10-CM | POA: Diagnosis not present

## 2019-11-01 DIAGNOSIS — E1165 Type 2 diabetes mellitus with hyperglycemia: Secondary | ICD-10-CM | POA: Diagnosis not present

## 2019-11-01 DIAGNOSIS — E119 Type 2 diabetes mellitus without complications: Secondary | ICD-10-CM | POA: Diagnosis not present

## 2019-11-01 DIAGNOSIS — I959 Hypotension, unspecified: Secondary | ICD-10-CM | POA: Diagnosis not present

## 2019-11-01 DIAGNOSIS — I10 Essential (primary) hypertension: Secondary | ICD-10-CM | POA: Diagnosis not present

## 2019-11-01 DIAGNOSIS — I6389 Other cerebral infarction: Secondary | ICD-10-CM | POA: Diagnosis not present

## 2019-11-01 DIAGNOSIS — Z03818 Encounter for observation for suspected exposure to other biological agents ruled out: Secondary | ICD-10-CM | POA: Diagnosis not present

## 2019-11-01 DIAGNOSIS — E860 Dehydration: Secondary | ICD-10-CM | POA: Diagnosis not present

## 2019-11-01 DIAGNOSIS — E0810 Diabetes mellitus due to underlying condition with ketoacidosis without coma: Secondary | ICD-10-CM | POA: Diagnosis not present

## 2019-11-01 DIAGNOSIS — Z4789 Encounter for other orthopedic aftercare: Secondary | ICD-10-CM | POA: Diagnosis not present

## 2019-11-01 DIAGNOSIS — G904 Autonomic dysreflexia: Secondary | ICD-10-CM | POA: Diagnosis not present

## 2019-11-01 DIAGNOSIS — Z87440 Personal history of urinary (tract) infections: Secondary | ICD-10-CM | POA: Diagnosis not present

## 2019-11-01 DIAGNOSIS — E1069 Type 1 diabetes mellitus with other specified complication: Secondary | ICD-10-CM | POA: Diagnosis not present

## 2019-11-01 DIAGNOSIS — M25519 Pain in unspecified shoulder: Secondary | ICD-10-CM | POA: Diagnosis not present

## 2019-11-01 DIAGNOSIS — R293 Abnormal posture: Secondary | ICD-10-CM | POA: Diagnosis not present

## 2019-11-01 DIAGNOSIS — Z66 Do not resuscitate: Secondary | ICD-10-CM | POA: Diagnosis not present

## 2019-11-01 DIAGNOSIS — G8220 Paraplegia, unspecified: Secondary | ICD-10-CM | POA: Diagnosis not present

## 2019-11-01 DIAGNOSIS — D539 Nutritional anemia, unspecified: Secondary | ICD-10-CM | POA: Diagnosis not present

## 2019-11-01 DIAGNOSIS — R0689 Other abnormalities of breathing: Secondary | ICD-10-CM | POA: Diagnosis not present

## 2019-11-01 DIAGNOSIS — N319 Neuromuscular dysfunction of bladder, unspecified: Secondary | ICD-10-CM | POA: Diagnosis not present

## 2019-11-01 DIAGNOSIS — Z794 Long term (current) use of insulin: Secondary | ICD-10-CM | POA: Diagnosis not present

## 2019-11-01 DIAGNOSIS — E1110 Type 2 diabetes mellitus with ketoacidosis without coma: Secondary | ICD-10-CM | POA: Diagnosis not present

## 2019-11-01 DIAGNOSIS — L8990 Pressure ulcer of unspecified site, unspecified stage: Secondary | ICD-10-CM | POA: Diagnosis not present

## 2019-11-01 DIAGNOSIS — E876 Hypokalemia: Secondary | ICD-10-CM | POA: Diagnosis not present

## 2019-11-01 DIAGNOSIS — N390 Urinary tract infection, site not specified: Secondary | ICD-10-CM | POA: Diagnosis not present

## 2019-11-01 DIAGNOSIS — L89100 Pressure ulcer of unspecified part of back, unstageable: Secondary | ICD-10-CM | POA: Diagnosis not present

## 2019-11-01 DIAGNOSIS — G9341 Metabolic encephalopathy: Secondary | ICD-10-CM | POA: Diagnosis not present

## 2019-11-01 DIAGNOSIS — N179 Acute kidney failure, unspecified: Secondary | ICD-10-CM | POA: Diagnosis not present

## 2019-11-01 DIAGNOSIS — Z8349 Family history of other endocrine, nutritional and metabolic diseases: Secondary | ICD-10-CM | POA: Diagnosis not present

## 2019-11-01 DIAGNOSIS — Z20828 Contact with and (suspected) exposure to other viral communicable diseases: Secondary | ICD-10-CM | POA: Diagnosis present

## 2019-11-01 DIAGNOSIS — Z8673 Personal history of transient ischemic attack (TIA), and cerebral infarction without residual deficits: Secondary | ICD-10-CM | POA: Diagnosis not present

## 2019-11-01 DIAGNOSIS — Z981 Arthrodesis status: Secondary | ICD-10-CM | POA: Diagnosis not present

## 2019-11-01 DIAGNOSIS — Z7401 Bed confinement status: Secondary | ICD-10-CM | POA: Diagnosis not present

## 2019-11-01 DIAGNOSIS — M62838 Other muscle spasm: Secondary | ICD-10-CM | POA: Diagnosis not present

## 2019-11-01 DIAGNOSIS — E7211 Homocystinuria: Secondary | ICD-10-CM | POA: Diagnosis not present

## 2019-11-01 DIAGNOSIS — I63443 Cerebral infarction due to embolism of bilateral cerebellar arteries: Secondary | ICD-10-CM | POA: Diagnosis not present

## 2019-11-01 DIAGNOSIS — K644 Residual hemorrhoidal skin tags: Secondary | ICD-10-CM | POA: Diagnosis not present

## 2019-11-01 DIAGNOSIS — Z743 Need for continuous supervision: Secondary | ICD-10-CM | POA: Diagnosis not present

## 2019-11-01 DIAGNOSIS — E1010 Type 1 diabetes mellitus with ketoacidosis without coma: Secondary | ICD-10-CM | POA: Diagnosis not present

## 2019-11-01 DIAGNOSIS — D62 Acute posthemorrhagic anemia: Secondary | ICD-10-CM | POA: Diagnosis not present

## 2019-11-01 DIAGNOSIS — Z8249 Family history of ischemic heart disease and other diseases of the circulatory system: Secondary | ICD-10-CM | POA: Diagnosis not present

## 2019-11-01 DIAGNOSIS — E569 Vitamin deficiency, unspecified: Secondary | ICD-10-CM | POA: Diagnosis not present

## 2019-11-01 DIAGNOSIS — R279 Unspecified lack of coordination: Secondary | ICD-10-CM | POA: Diagnosis not present

## 2019-11-01 LAB — GLUCOSE, CAPILLARY
Glucose-Capillary: 104 mg/dL — ABNORMAL HIGH (ref 70–99)
Glucose-Capillary: 103 mg/dL — ABNORMAL HIGH (ref 70–99)
Glucose-Capillary: 117 mg/dL — ABNORMAL HIGH (ref 70–99)

## 2019-11-01 MED ORDER — SODIUM CHLORIDE 0.9 % IV BOLUS
500.0000 mL | Freq: Once | INTRAVENOUS | Status: AC
  Administered 2019-11-01: 500 mL via INTRAVENOUS

## 2019-11-01 NOTE — Telephone Encounter (Signed)
Patient nurse at [facility] calling for instructions on monitor.  It is off and she doesn't know what to do from here.

## 2019-11-01 NOTE — Telephone Encounter (Signed)
Left voicemail message to call back for review of options regarding heart monitoring.   Verbal discussion with Dr. Rockey Situ regarding patient and monitor use. Recommendations were to see if he would like to try another monitor or we could insert a loop monitor (Reveal Linq) which may be a better option.

## 2019-11-01 NOTE — Telephone Encounter (Signed)
See other telephone encounter. If patient wants a replacement monitor Kim at [employer] is the Camera operator and she would be happy to place if we send one. Reviewed that I would call him on Monday and review options from provider. She verbalized understanding with no further questions.

## 2019-11-01 NOTE — Discharge Summary (Signed)
Physician Discharge Summary  [name] [name] PPI:[id number] DOB: [birth year] DOA: 10/26/2019  PCP: Einar Pheasant, MD  Admit date: 10/26/2019 Discharge date: 11/01/2019  Admitted From: snf Disposition: SNF  Recommendations for Outpatient Follow-up:  1. Follow up with PCP in 7-10 days. 2. Follow up with Cardiology as directed. 3. Follow up with neurology as directed. 4. BMP to be drawn on 11/06/2019 and reported to PCP. 5. Follow up with orthopedic surgery in one month. 6. Cardiology to arrange for Zio monitor before discharge  Home Health:No Equipment/Devices:no new equipment  Discharge Condition:Stable CODE STATUS:DNR Diet recommendation: Diabetic diet  Brief/Interim Summary: [name] Maser Rimmeris a 48 y.o.malewithhistory of diabetes mellitus type 1 with quadriplegia and autonomic dysreflexia who had a recent left shoulder surgery and discharged back to skilled nursing facility was found to be having elevated blood sugar with more than 500 and confused. Most of the history was obtained from ER physician. Not able to reach patient's family. Patient did not have any nausea vomiting diarrhea chest pain or shortness of breath fever or chills.  ED Course:In the ER patient appeared confused and restless initially. Became more alert and awake as he received IV fluids and insulin. Patient also received 1 dose of droperidol. Labs show sodium 126 potassium 5.2 bicarb 11 glucose 536 anion gap 25 procalcitonin was 1.1 lactic acid 1.7 VBG showed pH of 7.1 hemoglobin 10.5 UA showing features concerning for UTI COVID-19 test was pending. Beta hydroxybutyric acid was more than 8. Patient was started on IV insulin infusion IV fluid bolus. Patient blood pressure also improved with fluids. At the time of my exam patient is more alert awake but still mildly restless. Does not complain of any pain. Patient is afebrile.EKG shows normal sinus rhythm with nonspecific ST-T changes. Chest  x-ray was unremarkable.   Hospital Course: Ct head/MRI demonstrated left cerebellar infarct, small right cerebellar infarct, and Left ACA territory infarct with small petechial hemorrhage. [name] Netz Rimmeris a 49 y.o.malewithhistory of diabetes mellitus type 1 with quadriplegia and autonomic dysreflexia who had a recent left shoulder surgery and discharged back to skilled nursing facility was found to be having elevated blood sugar with more than 500 and confused. Most of the history was obtained from ER physician. Not able to reach patient's family. Patient did not have any nausea vomiting diarrhea chest pain or shortness of breath fever or chills. After admission patient remained confused for the past week. His respiratory condition worsened as well requiring CCM evaluation. Mentation improved rapidly. CT head and MRI brain shows possibility of acute stroke. Neurology, CCM and cardiology consulted.  TEE demonstrated no intracardiac thrombus or PFO. Cardiology has arranged Zio monitor for discharge. The patient will follow up with cardiology and orthopedic surgery as directed. Casrotid doppler was performed. It demonstrated: Minor carotid atherosclerosis. No hemodynamically significant ICA stenosis. Degree of narrowing less than 50% bilaterally by ultrasound criteria. Patent antegrade vertebral flow bilaterally.  Patient remained in the hospital additional day awaiting transportation back to facility. Stable for discharge today  Discharge Diagnoses:  Principal Problem:   DKA (diabetic ketoacidoses) ([place]) Active Problems:   Anemia   Decubitus ulcer of back   ARF (acute renal failure) ([facility])   Embolic stroke [facility])    Discharge Instructions  Discharge Instructions    Activity as tolerated - No restrictions   Complete by: As directed    Call MD for:  difficulty breathing, headache or visual disturbances   Complete by: As directed    Call MD for:  difficulty breathing,  headache or visual disturbances   Complete by: As directed    Call MD for:  extreme fatigue   Complete by: As directed    Call MD for:  hives   Complete by: As directed    Call MD for:  persistant dizziness or light-headedness   Complete by: As directed    Call MD for:  persistant nausea and vomiting   Complete by: As directed    Call MD for:  severe uncontrolled pain   Complete by: As directed    Call MD for:  temperature >100.4   Complete by: As directed    Call MD for:  temperature >100.4   Complete by: As directed    Diet - low sodium heart healthy   Complete by: As directed    Diet - low sodium heart healthy   Complete by: As directed    Diet Carb Modified   Complete by: As directed    Discharge instructions   Complete by: As directed    Follow up with PCP in 7-10 days. Follow up with Cardiology as directed. Follow up with neurology as directed. BMP to be drawn on 11/06/2019 and reported to PCP. Follow up with orthopedic surgery in one month. Zio monitor to be placed prior to discharge.   Increase activity slowly   Complete by: As directed    Increase activity slowly   Complete by: As directed      Allergies as of 11/01/2019      Reactions   Decongestant [pseudoephedrine Hcl] Other (See Comments)   Cause UTIs   Ivp Dye [iodinated Diagnostic Agents] Hives, Other (See Comments)   Urticaria   Sulfa Antibiotics Swelling, Other (See Comments)   Tongue swells      Medication List    STOP taking these medications   BUFFERED SALT PO   ciprofloxacin 500 MG tablet Commonly known as: Cipro   omeprazole 40 MG capsule Commonly known as: PRILOSEC Replaced by: pantoprazole 40 MG tablet     TAKE these medications   aspirin 325 MG tablet Take 1 tablet (325 mg total) by mouth daily.   B-12 2500 MCG Tabs Take 2,500 mg by mouth daily.   B-D UF III MINI PEN NEEDLES 31G X 5 MM Misc Generic drug: Insulin Pen Needle AS DIRECTED.   baclofen 20 MG tablet Commonly  known as: LIORESAL Take 2 tablets (40 mg total) by mouth 3 (three) times daily as needed for muscle spasms. What changed: See the new instructions.   blood glucose meter kit and supplies Kit Dispense Dexcom G6 Sensor meter to check blood sugars up to 4 times daily. DX code E 10.9   busPIRone 5 MG tablet Commonly known as: BUSPAR Take 1 tablet (5 mg total) by mouth 2 (two) times daily. Notes to patient: Take tonight   Carafate 1 g tablet Generic drug: sucralfate Take 1 g by mouth 3 (three) times daily before meals.   fludrocortisone 0.1 MG tablet Commonly known as: FLORINEF Take 0.1 mg by mouth 3 (three) times daily as needed (low bp).   HUMALOG JUNIOR KWIKPEN [place] Inject 1-6 Units into the skin 3 (three) times daily. If blood sugar is less than 60, call MD. If blood sugar is 120 to 170, give 1 unit. If blood sugar is 171 to 220, give 2 units. If blood sugar is 221 to 270, give 3 units. If blood sugar is 271 to 321, give 4 units. If blood sugar is 322 to  371, give 5 units. If blood sugar is 372 to 421, give 6 units. If blood sugar is greater than 450, call MD.   hydrALAZINE 25 MG tablet Commonly known as: APRESOLINE Take 1 tablet (25 mg total) by mouth every 8 (eight) hours.   hydrocortisone 25 MG suppository Commonly known as: ANUSOL-HC Place 1 suppository (25 mg total) rectally 2 (two) times daily as needed for hemorrhoids or anal itching.   ibuprofen 200 MG tablet Commonly known as: ADVIL Take 600 mg by mouth 3 (three) times daily as needed (for pain.).   insulin detemir 100 UNIT/ML injection Commonly known as: Levemir Inject 29 units at bedtime What changed:   how much to take  how to take this  when to take this  additional instructions   methenamine 1 g tablet Commonly known as: MANDELAMINE Take 1,000 mg by mouth 2 (two) times daily. Notes to patient: 10/30/19   metoprolol tartrate 25 MG tablet Commonly known as: LOPRESSOR Take 1 tablet (25 mg total)  by mouth 2 (two) times daily.   naphazoline-glycerin 0.012-0.2 % Soln Commonly known as: CLEAR EYES REDNESS Place 1-2 drops into both eyes 4 (four) times daily as needed for irritation.   oxyCODONE 5 MG immediate release tablet Commonly known as: Oxy IR/ROXICODONE Take 1 tablet (5 mg total) by mouth every 4 (four) hours as needed for moderate pain or severe pain. What changed: how much to take   pantoprazole 40 MG tablet Commonly known as: PROTONIX Take 1 tablet (40 mg total) by mouth daily. Replaces: omeprazole 40 MG capsule   Preparation H 0.25-88.44 % suppository Generic drug: shark liver oil-cocoa butter Place 1 suppository rectally 2 (two) times daily.   vitamin C 250 MG tablet Commonly known as: ASCORBIC ACID Take 1,000 mg by mouth 2 (two) times daily.     ASK your doctor about these medications    stroke: mapping our early stages of recovery book Misc 1 each by Does not apply route once for 1 dose. Ask about: Should I take this medication?       Allergies  Allergen Reactions  . Decongestant [Pseudoephedrine Hcl] Other (See Comments)    Cause UTIs  . Ivp Dye [Iodinated Diagnostic Agents] Hives and Other (See Comments)    Urticaria   . Sulfa Antibiotics Swelling and Other (See Comments)    Tongue swells    Consultations:  ortho, neuro   Procedures/Studies: Ct Head Wo Contrast  Result Date: 10/26/2019 CLINICAL DATA:  Altered level of consciousness EXAM: CT HEAD WITHOUT CONTRAST TECHNIQUE: Contiguous axial images were obtained from the base of the skull through the vertex without intravenous contrast. COMPARISON:  10/27/2010 FINDINGS: Brain: There is a hypodense lesion of the left cerebellar hemisphere (series 2, image 8). Vascular: No hyperdense vessel or unexpected calcification. Skull: Normal. Negative for fracture or focal lesion. Sinuses/Orbits: No acute finding. Other: None. IMPRESSION: There is a hypodense lesion of the left cerebellar hemisphere (series  2, image 8), new compared to remote prior examination dated 10/27/2010 and suspicious for acute to subacute infarction. MRI may be used to further assess for acute diffusion restricting infarction. Electronically Signed   By: Eddie Candle M.D.   On: 10/26/2019 14:52   Mr Brain Wo Contrast  Addendum Date: 10/26/2019   ADDENDUM REPORT: 10/26/2019 23:40 ADDENDUM: Study discussed by telephone with Dr. Silas [place] on 10/26/2019 at 2334 hours. Electronically Signed   By: Genevie Ann M.D.   On: 10/26/2019 23:40   Result Date: 10/26/2019 CLINICAL DATA:  48 year old male with altered level of consciousness. Ataxia. Abnormal hypodensity in the left cerebellum on head CT earlier today. EXAM: MRI HEAD WITHOUT CONTRAST TECHNIQUE: Multiplanar, multiecho pulse sequences of the brain and surrounding structures were obtained without intravenous contrast. COMPARISON:  Head CT 1438 hours today. FINDINGS: Brain: Confluent 2.5 centimeter area of restricted diffusion in the left cerebellar hemisphere corresponding to the CT hypodensity earlier today. Associated T2 and FLAIR hyperintensity, and possible mild petechial hemorrhage (series 8, image 12), but no mass effect. Additionally, there are much smaller scattered areas of restricted diffusion in the right cerebellar hemisphere (series 2, image 13). No brainstem restricted diffusion. However, there is also a small but confluent area of restricted diffusion in the left cingulate gyrus (series 2, image 33). T2 and FLAIR hyperintensity in those areas with no associated hemorrhage or mass effect. Underlying cerebral volume is normal. And outside of the acute areas gray and white matter signal appears within normal limits. No chronic cortical encephalomalacia or blood products. No midline shift, mass effect, evidence of mass lesion, ventriculomegaly, extra-axial collection. Cervicomedullary junction and pituitary are within normal limits. Deep gray matter nuclei appear normal. Vascular:  Major intracranial vascular flow voids are preserved. Skull and upper cervical spine: Negative visible cervical spine. Visualized bone marrow signal is within normal limits. Sinuses/Orbits: Negative orbits. Trace paranasal sinus mucosal thickening. Other: Trace retained secretions in the nasopharynx. Mastoids are clear. Visible internal auditory structures appear normal. Scalp and face soft tissues appear negative. IMPRESSION: 1. 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 This pattern of anterior and posterior circulation ischemia raises the possibility of a recent embolic event. 2. Possible petechial hemorrhage in the dominant left cerebellar infarct, but no malignant hemorrhagic transformation and no intracranial mass effect. 3. Normal background MRI appearance of the brain. Electronically Signed: By: Genevie Ann M.D. On: 10/26/2019 23:22   US Carotid Bilateral (at [facility] And [facility] Only)  Result Date: 10/28/2019 CLINICAL DATA:  Hypertension, stroke, diabetes EXAM: BILATERAL CAROTID DUPLEX ULTRASOUND TECHNIQUE: Pearline Cables scale imaging, color Doppler and duplex ultrasound were performed of bilateral carotid and vertebral arteries in the neck. COMPARISON:  None. FINDINGS: Criteria: Quantification of carotid stenosis is based on velocity parameters that correlate the residual internal carotid diameter with NASCET-based stenosis levels, using the diameter of the distal internal carotid lumen as the denominator for stenosis measurement. The following velocity measurements were obtained: RIGHT ICA: 104/23 cm/sec CCA: 29/52 cm/sec SYSTOLIC ICA/CCA RATIO:  1.4 ECA: 72 cm/sec LEFT ICA: 112/55 cm/sec CCA: 84/13 cm/sec SYSTOLIC ICA/CCA RATIO:  1.4 ECA: 94 cm/sec RIGHT CAROTID ARTERY: Minor echogenic shadowing plaque formation. No hemodynamically significant right ICA stenosis, velocity  elevation, or turbulent flow. Degree of narrowing less than 50%. RIGHT VERTEBRAL ARTERY:  Antegrade LEFT CAROTID ARTERY: Similar scattered minor echogenic plaque formation. No hemodynamically significant left ICA stenosis, velocity elevation, or turbulent flow. LEFT VERTEBRAL ARTERY:  Antegrade IMPRESSION: Minor carotid atherosclerosis. No hemodynamically significant ICA stenosis. Degree of narrowing less than 50% bilaterally by ultrasound criteria. Patent antegrade vertebral flow bilaterally Electronically Signed   By: [name].  Shick M.D.   On: 10/28/2019 10:07   US Venous Img Lower Bilateral (dvt)  Result Date: 10/27/2019 CLINICAL DATA:  Bilateral lower extremity edema. 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 Evaluate for DVT. EXAM: BILATERAL LOWER EXTREMITY VENOUS DOPPLER ULTRASOUND TECHNIQUE: Gray-scale sonography with graded compression, as well as  color Doppler and duplex ultrasound were performed to evaluate the lower extremity deep venous systems from the level of the common femoral vein and including the common femoral, femoral, profunda femoral, popliteal and calf veins including the posterior tibial, peroneal and gastrocnemius veins when visible. The superficial great saphenous vein was also interrogated. Spectral Doppler was utilized to evaluate flow at rest and with distal augmentation maneuvers in the common femoral, femoral and popliteal veins. COMPARISON:  None. FINDINGS: RIGHT LOWER EXTREMITY Common Femoral Vein: No evidence of thrombus. Normal compressibility, respiratory phasicity and response to augmentation. Saphenofemoral Junction: No evidence of thrombus. Normal compressibility and flow on color Doppler imaging. Profunda Femoral Vein: No evidence of thrombus. Normal compressibility and flow on color Doppler imaging. Femoral Vein: No evidence of thrombus. Normal compressibility, respiratory phasicity and response to augmentation. Popliteal Vein: No evidence of thrombus. Normal  compressibility, respiratory phasicity and response to augmentation. Calf Veins: No evidence of thrombus. Normal compressibility and flow on color Doppler imaging. Superficial Great Saphenous Vein: No evidence of thrombus. Normal compressibility. Venous Reflux:  None. Other Findings:  None. LEFT LOWER EXTREMITY Common Femoral Vein: No evidence of thrombus. Normal compressibility, respiratory phasicity and response to augmentation. Saphenofemoral Junction: No evidence of thrombus. Normal compressibility and flow on color Doppler imaging. Profunda Femoral Vein: No evidence of thrombus. Normal compressibility and flow on color Doppler imaging. Femoral Vein: No evidence of thrombus. Normal compressibility, respiratory phasicity and response to augmentation. Popliteal Vein: No evidence of thrombus. Normal compressibility, respiratory phasicity and response to augmentation. Calf Veins: Appear patent where imaged. Superficial Great Saphenous Vein: No evidence of thrombus. Normal compressibility. Venous Reflux:  None. Other Findings:  None. IMPRESSION: No evidence of DVT within either lower extremity. Electronically Signed   By: Sandi Mariscal M.D.   On: 10/27/2019 12:07   [name] Complete Joint Space Structure Up Left  Result Date: 10/27/2019 CLINICAL DATA:  Status post repair of rotator cuff tear on 10/19/2019. EXAM: ULTRASOUND LEFT UPPER EXTREMITY COMPLETE TECHNIQUE: Ultrasound examination was performed including evaluation of the muscles, tendons, joint, and adjacent soft tissues. COMPARISON:  MRI dated 09/07/2019 FINDINGS: The visualized portions of the rotator cuff appear to be intact. No appreciable fluid in the subacromial or subdeltoid bursae. No appreciable glenohumeral joint effusion. IMPRESSION: The repaired rotator cuff appears to be intact. Electronically Signed   By: Lorriane Shire M.D.   On: 10/27/2019 16:15   Dg Chest Portable 1 View  Result Date: 10/26/2019 CLINICAL DATA:  Hypotension, DKA EXAM: PORTABLE  CHEST 1 VIEW COMPARISON:  09/18/2015 FINDINGS: Heart and mediastinal contours are within normal limits. No focal opacities or effusions. No acute bony abnormality. IMPRESSION: No active disease. Electronically Signed   By: Rolm Baptise M.D.   On: 10/26/2019 02:30   [name] Or Nerve Block-image Only ([facility])  Result Date: 10/19/2019 There is no interpretation for this exam.  This order is for images obtained during a surgical procedure.  Please See "Surgeries" Tab for more information regarding the procedure.       Subjective: No acute complaints No events overnight Stable for discharge  Discharge Exam: Vitals:   11/01/19 0854 11/01/19 1147  BP: 127/80 (!) 84/54  Pulse: (!) 109 63  Resp:    Temp:    SpO2:     Vitals:   11/01/19 0619 11/01/19 0636 11/01/19 0854 11/01/19 1147  BP: (!) 172/121 138/87 127/80 (!) 84/54  Pulse: (!) 118 (!) 113 (!) 109 63  Resp: 20     Temp: 98.3 F (  36.8 C)     TempSrc: Oral     SpO2: 99% 99%    Weight:      Height:        General: Pt is alert, awake, not in acute distress chronic quadriplegia Cardiovascular: RRR, S1/S2 +, no rubs, no gallops Respiratory: CTA bilaterally, no wheezing, no rhonchi Abdominal: Soft, NT, ND, bowel sounds + Extremities: no edema, no cyanosis no contractures    The results of significant diagnostics from this hospitalization (including imaging, microbiology, ancillary and laboratory) are listed below for reference.     Microbiology: Recent Results (from the past 240 hour(s))  SARS CORONAVIRUS 2 (TAT 6-24 HRS) Nasopharyngeal Nasopharyngeal Swab     Status: None   Collection Time: 10/23/19 10:08 AM   Specimen: Nasopharyngeal Swab  Result Value Ref Range Status   SARS Coronavirus 2 NEGATIVE NEGATIVE Final    Comment: (NOTE) SARS-CoV-2 target nucleic acids are NOT DETECTED. The SARS-CoV-2 RNA is generally detectable in upper and lower respiratory specimens during the acute phase of infection. Negative results do  not preclude SARS-CoV-2 infection, do not rule out co-infections with other pathogens, and should not be used as the sole basis for treatment or other patient management decisions. Negative results must be combined with clinical observations, patient history, and epidemiological information. The expected result is Negative. Fact Sheet for Patients: SugarRoll.be Fact Sheet for Healthcare Providers: https://www.woods-mathews.com/ This test is not yet approved or cleared by the [place] FDA and  has been authorized for detection and/or diagnosis of SARS-CoV-2 by FDA under an Emergency Use Authorization (EUA). This EUA will remain  in effect (meaning this test can be used) for the duration of the COVID-19 declaration under Section 56 4(b)(1) of the Act, 21 U.S.C. section 360bbb-3(b)(1), unless the authorization is terminated or revoked sooner. Performed at [facility], [place] [address], [place], [place] [address]   Urine culture     Status: Abnormal   Collection Time: 10/26/19  2:39 AM   Specimen: Urine, Random  Result Value Ref Range Status   Specimen Description   Final    URINE, RANDOM Performed at [facility], [address], [place], [place] [address]    Special Requests   Final    NONE Performed at [facility], [address] [place], [place] [address]    Culture (A)  Final    <10,000 COLONIES/mL INSIGNIFICANT GROWTH Performed at [facility], [place] [address] [place], [place] [address]    Report Status 10/26/2019 FINAL  Final  SARS CORONAVIRUS 2 (TAT 6-24 HRS) Nasopharyngeal Nasopharyngeal Swab     Status: None   Collection Time: 10/26/19  2:39 AM   Specimen: Nasopharyngeal Swab  Result Value Ref Range Status   SARS Coronavirus 2 NEGATIVE NEGATIVE Final    Comment: (NOTE) SARS-CoV-2 target nucleic acids are NOT DETECTED. The SARS-CoV-2 RNA is generally detectable in upper and  lower respiratory specimens during the acute phase of infection. Negative results do not preclude SARS-CoV-2 infection, do not rule out co-infections with other pathogens, and should not be used as the sole basis for treatment or other patient management decisions. Negative results must be combined with clinical observations, patient history, and epidemiological information. The expected result is Negative. Fact Sheet for Patients: SugarRoll.be Fact Sheet for Healthcare Providers: https://www.woods-mathews.com/ This test is not yet approved or cleared by the [place] FDA and  has been authorized for detection and/or diagnosis of SARS-CoV-2 by FDA under an Emergency Use  Authorization (EUA). This EUA will remain  in effect (meaning this test can be used) for the duration of the COVID-19 declaration under Section 56 4(b)(1) of the Act, 21 U.S.C. section 360bbb-3(b)(1), unless the authorization is terminated or revoked sooner. Performed at [facility], [place] [address], [place], [place] [address]   CULTURE, BLOOD (ROUTINE X 2) w Reflex to ID Panel     Status: None (Preliminary result)   Collection Time: 10/26/19  8:52 AM   Specimen: BLOOD  Result Value Ref Range Status   Specimen Description BLOOD RIGHT AC  Final   Special Requests   Final    BOTTLES DRAWN AEROBIC AND ANAEROBIC Blood Culture results may not be optimal due to an excessive volume of blood received in culture bottles   Culture   Final    NO GROWTH 4 DAYS Performed at [facility], [address] [place], [place] [address]    Report Status PENDING  Incomplete  CULTURE, BLOOD (ROUTINE X 2) w Reflex to ID Panel     Status: None (Preliminary result)   Collection Time: 10/26/19  8:52 AM   Specimen: BLOOD  Result Value Ref Range Status   Specimen Description BLOOD LEFT HAND  Final   Special Requests   Final    BOTTLES DRAWN AEROBIC AND ANAEROBIC Blood Culture  adequate volume   Culture   Final    NO GROWTH 4 DAYS Performed at [facility], [address] [place], [place] [address]    Report Status PENDING  Incomplete  MRSA PCR Screening     Status: None   Collection Time: 10/26/19  2:28 PM   Specimen: Nasal Mucosa; Nasopharyngeal  Result Value Ref Range Status   MRSA by PCR NEGATIVE NEGATIVE Final    Comment:        The GeneXpert MRSA Assay (FDA approved for NASAL specimens only), is one component of a comprehensive MRSA colonization surveillance program. It is not intended to diagnose MRSA infection nor to guide or monitor treatment for MRSA infections. Performed at [facility], [place], [place] [address]   SARS CORONAVIRUS 2 (TAT 6-24 HRS) Nasopharyngeal Nasopharyngeal Swab     Status: None   Collection Time: 10/30/19 10:47 PM   Specimen: Nasopharyngeal Swab  Result Value Ref Range Status   SARS Coronavirus 2 NEGATIVE NEGATIVE Final    Comment: (NOTE) SARS-CoV-2 target nucleic acids are NOT DETECTED. The SARS-CoV-2 RNA is generally detectable in upper and lower respiratory specimens during the acute phase of infection. Negative results do not preclude SARS-CoV-2 infection, do not rule out co-infections with other pathogens, and should not be used as the sole basis for treatment or other patient management decisions. Negative results must be combined with clinical observations, patient history, and epidemiological information. The expected result is Negative. Fact Sheet for Patients: SugarRoll.be Fact Sheet for Healthcare Providers: https://www.woods-mathews.com/ This test is not yet approved or cleared by the [place] FDA and  has been authorized for detection and/or diagnosis of SARS-CoV-2 by FDA under an Emergency Use Authorization (EUA). This EUA will remain  in effect (meaning this test can be used) for the duration of the COVID-19  declaration under Section 56 4(b)(1) of the Act, 21 U.S.C. section 360bbb-3(b)(1), unless the authorization is terminated or revoked sooner. Performed at [facility], [place] [address] [place], [place] [address]      Labs: BNP (last 3 results) No results for input(s): BNP in the last  8760 hours. Basic Metabolic Panel: Recent Labs  Lab 10/26/19 0119  10/26/19 2219 10/27/19 0613 10/28/19 0500 10/29/19 0431 10/30/19 0428  NA 126*   < > 132* 134* 131* 131* 131*  K 5.2*   < > 3.2* 3.3* 3.9 3.8 3.8  CL 90*   < > 102 102 100 97* 90*  CO2 11*   < > 21* 23 24 24 30   GLUCOSE 536*   < > 167* 114* 217* 152* 170*  BUN 31*   < > 21* 17 12 8 10   CREATININE 1.56*   < > 0.65 0.59* 0.67 0.59* 0.58*  CALCIUM 8.4*   < > 8.2* 8.2* 8.0* 8.4* 8.9  MG 1.5*  --   --  1.7 1.9 1.7 1.8  PHOS 5.1*  --   --  3.1 2.9 2.8  --    < > = values in this interval not displayed.   Liver Function Tests: Recent Labs  Lab 10/26/19 0852 10/27/19 0613 10/28/19 0500 10/29/19 0431 10/30/19 0428  AST 29 25 28  33 27  ALT 20 17 16 17 19   ALKPHOS 87 68 59 83 91  BILITOT 1.4* 0.6 0.4 0.6 0.8  PROT 6.6 5.5* 5.0* 5.9* 6.5  ALBUMIN 3.7 3.0* 2.7* 3.2* 3.5   No results for input(s): LIPASE, AMYLASE in the last 168 hours. Recent Labs  Lab 10/26/19 0852  AMMONIA 16   CBC: Recent Labs  Lab 10/26/19 0119 10/27/19 0613 10/30/19 0428  WBC 6.5 7.1 6.8  NEUTROABS  --  5.8 4.9  HGB 10.5* 10.1* 12.9*  HCT 31.8* 29.1* 36.4*  MCV 97.5 93.0 90.3  PLT 188 235 275   Cardiac Enzymes: No results for input(s): CKTOTAL, CKMB, CKMBINDEX, TROPONINI in the last 168 hours. BNP: Invalid input(s): POCBNP CBG: Recent Labs  Lab 10/31/19 1615 10/31/19 2134 11/01/19 0345 11/01/19 0807 11/01/19 1154  GLUCAP 153* 158* 104* 103* 117*   D-Dimer No results for input(s): DDIMER in the last 72 hours. Hgb A1c No results for input(s): HGBA1C in the last 72 hours. Lipid Profile No results for input(s): CHOL, HDL,  LDLCALC, TRIG, CHOLHDL, LDLDIRECT in the last 72 hours. Thyroid function studies No results for input(s): TSH, T4TOTAL, T3FREE, THYROIDAB in the last 72 hours.  Invalid input(s): FREET3 Anemia work up No results for input(s): VITAMINB12, FOLATE, FERRITIN, TIBC, IRON, RETICCTPCT in the last 72 hours. Urinalysis    Component Value Date/Time   COLORURINE YELLOW (A) 10/26/2019 0239   APPEARANCEUR HAZY (A) 10/26/2019 0239   APPEARANCEUR Clear 12/18/2013 1840   LABSPEC 1.017 10/26/2019 0239   LABSPEC 1.006 12/18/2013 1840   PHURINE 5.0 10/26/2019 0239   GLUCOSEU >=500 (A) 10/26/2019 0239   GLUCOSEU 150 mg/dL 12/18/2013 1840   HGBUR NEGATIVE 10/26/2019 0239   BILIRUBINUR NEGATIVE 10/26/2019 0239   BILIRUBINUR Negative 12/18/2013 1840   KETONESUR 80 (A) 10/26/2019 0239   PROTEINUR NEGATIVE 10/26/2019 0239   NITRITE NEGATIVE 10/26/2019 0239   LEUKOCYTESUR SMALL (A) 10/26/2019 0239   LEUKOCYTESUR Negative 12/18/2013 1840   Sepsis Labs Invalid input(s): PROCALCITONIN,  WBC,  LACTICIDVEN Microbiology Recent Results (from the past 240 hour(s))  SARS CORONAVIRUS 2 (TAT 6-24 HRS) Nasopharyngeal Nasopharyngeal Swab     Status: None   Collection Time: 10/23/19 10:08 AM   Specimen: Nasopharyngeal Swab  Result Value Ref Range Status   SARS Coronavirus 2 NEGATIVE NEGATIVE Final    Comment: (NOTE) SARS-CoV-2 target nucleic acids are NOT DETECTED. The SARS-CoV-2 RNA is generally detectable in upper and  lower respiratory specimens during the acute phase of infection. Negative results do not preclude SARS-CoV-2 infection, do not rule out co-infections with other pathogens, and should not be used as the sole basis for treatment or other patient management decisions. Negative results must be combined with clinical observations, patient history, and epidemiological information. The expected result is Negative. Fact Sheet for Patients: SugarRoll.be Fact Sheet for  Healthcare Providers: https://www.woods-mathews.com/ This test is not yet approved or cleared by the [place] FDA and  has been authorized for detection and/or diagnosis of SARS-CoV-2 by FDA under an Emergency Use Authorization (EUA). This EUA will remain  in effect (meaning this test can be used) for the duration of the COVID-19 declaration under Section 56 4(b)(1) of the Act, 21 U.S.C. section 360bbb-3(b)(1), unless the authorization is terminated or revoked sooner. Performed at [facility], [place] [address] [place], [place] [address]   Urine culture     Status: Abnormal   Collection Time: 10/26/19  2:39 AM   Specimen: Urine, Random  Result Value Ref Range Status   Specimen Description   Final    URINE, RANDOM Performed at [facility], [address], [place], [place] [address]    Special Requests   Final    NONE Performed at [facility], [place] [place], [place] [address]    Culture (A)  Final    <10,000 COLONIES/mL INSIGNIFICANT GROWTH Performed at [facility], [place] [address] [place], [place] [address]    Report Status 10/26/2019 FINAL  Final  SARS CORONAVIRUS 2 (TAT 6-24 HRS) Nasopharyngeal Nasopharyngeal Swab     Status: None   Collection Time: 10/26/19  2:39 AM   Specimen: Nasopharyngeal Swab  Result Value Ref Range Status   SARS Coronavirus 2 NEGATIVE NEGATIVE Final    Comment: (NOTE) SARS-CoV-2 target nucleic acids are NOT DETECTED. The SARS-CoV-2 RNA is generally detectable in upper and lower respiratory specimens during the acute phase of infection. Negative results do not preclude SARS-CoV-2 infection, do not rule out co-infections with other pathogens, and should not be used as the sole basis for treatment or other patient management decisions. Negative results must be combined with clinical observations, patient history, and epidemiological information. The expected result is Negative. Fact Sheet  for Patients: SugarRoll.be Fact Sheet for Healthcare Providers: https://www.woods-mathews.com/ This test is not yet approved or cleared by the [place] FDA and  has been authorized for detection and/or diagnosis of SARS-CoV-2 by FDA under an Emergency Use Authorization (EUA). This EUA will remain  in effect (meaning this test can be used) for the duration of the COVID-19 declaration under Section 56 4(b)(1) of the Act, 21 U.S.C. section 360bbb-3(b)(1), unless the authorization is terminated or revoked sooner. Performed at [facility], [place] [address], [place], [place] [address]   CULTURE, BLOOD (ROUTINE X 2) w Reflex to ID Panel     Status: None (Preliminary result)   Collection Time: 10/26/19  8:52 AM   Specimen: BLOOD  Result Value Ref Range Status   Specimen Description BLOOD RIGHT AC  Final   Special Requests   Final    BOTTLES DRAWN AEROBIC AND ANAEROBIC Blood Culture results may not be optimal due to an excessive volume of blood received in culture bottles   Culture   Final    NO GROWTH 4 DAYS Performed at [facility], [address] [place], [place] [address]    Report Status PENDING  Incomplete  CULTURE,  BLOOD (ROUTINE X 2) w Reflex to ID Panel     Status: None (Preliminary result)   Collection Time: 10/26/19  8:52 AM   Specimen: BLOOD  Result Value Ref Range Status   Specimen Description BLOOD LEFT HAND  Final   Special Requests   Final    BOTTLES DRAWN AEROBIC AND ANAEROBIC Blood Culture adequate volume   Culture   Final    NO GROWTH 4 DAYS Performed at [facility], [address] [place], [place] [address]    Report Status PENDING  Incomplete  MRSA PCR Screening     Status: None   Collection Time: 10/26/19  2:28 PM   Specimen: Nasal Mucosa; Nasopharyngeal  Result Value Ref Range Status   MRSA by PCR NEGATIVE NEGATIVE Final    Comment:        The GeneXpert MRSA Assay (FDA approved for  NASAL specimens only), is one component of a comprehensive MRSA colonization surveillance program. It is not intended to diagnose MRSA infection nor to guide or monitor treatment for MRSA infections. Performed at [facility], [place], [place] [address]   SARS CORONAVIRUS 2 (TAT 6-24 HRS) Nasopharyngeal Nasopharyngeal Swab     Status: None   Collection Time: 10/30/19 10:47 PM   Specimen: Nasopharyngeal Swab  Result Value Ref Range Status   SARS Coronavirus 2 NEGATIVE NEGATIVE Final    Comment: (NOTE) SARS-CoV-2 target nucleic acids are NOT DETECTED. The SARS-CoV-2 RNA is generally detectable in upper and lower respiratory specimens during the acute phase of infection. Negative results do not preclude SARS-CoV-2 infection, do not rule out co-infections with other pathogens, and should not be used as the sole basis for treatment or other patient management decisions. Negative results must be combined with clinical observations, patient history, and epidemiological information. The expected result is Negative. Fact Sheet for Patients: SugarRoll.be Fact Sheet for Healthcare Providers: https://www.woods-mathews.com/ This test is not yet approved or cleared by the [place] FDA and  has been authorized for detection and/or diagnosis of SARS-CoV-2 by FDA under an Emergency Use Authorization (EUA). This EUA will remain  in effect (meaning this test can be used) for the duration of the COVID-19 declaration under Section 56 4(b)(1) of the Act, 21 U.S.C. section 360bbb-3(b)(1), unless the authorization is terminated or revoked sooner. Performed at [facility], [place] [address] [place], [place] [address]      Time coordinating discharge: Over 30 minutes  SIGNED:   Nicolette Bang, MD  [facility] 11/01/2019, 11:56 AM Pager   If 7PM-7AM, please contact  night-coverage [email or web address] Password [email or web address]

## 2019-11-01 NOTE — Telephone Encounter (Signed)
Patient calling to report monitor will not stick now and is off.    Advised patient while waiting on call back to call support line on zio box to troubleshoot options with them.

## 2019-11-01 NOTE — Progress Notes (Signed)
[name] A and O X 4. VSS. Pt tolerating diet well. No complaints of pain or nausea. IV removed intact, prescriptions given. Pt voiced understanding of discharge instructions with no further questions. Pt discharged via EMS.    Allergies as of 11/01/2019      Reactions   Decongestant [pseudoephedrine Hcl] Other (See Comments)   Cause UTIs   Ivp Dye [iodinated Diagnostic Agents] Hives, Other (See Comments)   Urticaria   Sulfa Antibiotics Swelling, Other (See Comments)   Tongue swells      Medication List    STOP taking these medications   BUFFERED SALT PO   ciprofloxacin 500 MG tablet Commonly known as: Cipro   omeprazole 40 MG capsule Commonly known as: PRILOSEC Replaced by: pantoprazole 40 MG tablet     TAKE these medications   aspirin 325 MG tablet Take 1 tablet (325 mg total) by mouth daily.   B-12 2500 MCG Tabs Take 2,500 mg by mouth daily.   B-D UF III MINI PEN NEEDLES 31G X 5 MM Misc Generic drug: Insulin Pen Needle AS DIRECTED.   baclofen 20 MG tablet Commonly known as: LIORESAL Take 2 tablets (40 mg total) by mouth 3 (three) times daily as needed for muscle spasms. What changed: See the new instructions.   blood glucose meter kit and supplies Kit Dispense Dexcom G6 Sensor meter to check blood sugars up to 4 times daily. DX code E 10.9   busPIRone 5 MG tablet Commonly known as: BUSPAR Take 1 tablet (5 mg total) by mouth 2 (two) times daily. Notes to patient: Take tonight   Carafate 1 g tablet Generic drug: sucralfate Take 1 g by mouth 3 (three) times daily before meals.   fludrocortisone 0.1 MG tablet Commonly known as: FLORINEF Take 0.1 mg by mouth 3 (three) times daily as needed (low bp).   HUMALOG JUNIOR KWIKPEN [place] Inject 1-6 Units into the skin 3 (three) times daily. If blood sugar is less than 60, call MD. If blood sugar is 120 to 170, give 1 unit. If blood sugar is 171 to 220, give 2 units. If blood sugar is 221 to 270, give 3  units. If blood sugar is 271 to 321, give 4 units. If blood sugar is 322 to 371, give 5 units. If blood sugar is 372 to 421, give 6 units. If blood sugar is greater than 450, call MD.   hydrALAZINE 25 MG tablet Commonly known as: APRESOLINE Take 1 tablet (25 mg total) by mouth every 8 (eight) hours. Notes to patient: Tonight   hydrocortisone 25 MG suppository Commonly known as: ANUSOL-HC Place 1 suppository (25 mg total) rectally 2 (two) times daily as needed for hemorrhoids or anal itching.   ibuprofen 200 MG tablet Commonly known as: ADVIL Take 600 mg by mouth 3 (three) times daily as needed (for pain.).   insulin detemir 100 UNIT/ML injection Commonly known as: Levemir Inject 29 units at bedtime What changed:   how much to take  how to take this  when to take this  additional instructions   methenamine 1 g tablet Commonly known as: MANDELAMINE Take 1,000 mg by mouth 2 (two) times daily. Notes to patient: 10/30/19   metoprolol tartrate 25 MG tablet Commonly known as: LOPRESSOR Take 1 tablet (25 mg total) by mouth 2 (two) times daily.   naphazoline-glycerin 0.012-0.2 % Soln Commonly known as: CLEAR EYES REDNESS Place 1-2 drops into both eyes 4 (four) times daily as needed for irritation.  oxyCODONE 5 MG immediate release tablet Commonly known as: Oxy IR/ROXICODONE Take 1 tablet (5 mg total) by mouth every 4 (four) hours as needed for moderate pain or severe pain. What changed: how much to take   pantoprazole 40 MG tablet Commonly known as: PROTONIX Take 1 tablet (40 mg total) by mouth daily. Replaces: omeprazole 40 MG capsule   Preparation H 0.25-88.44 % suppository Generic drug: shark liver oil-cocoa butter Place 1 suppository rectally 2 (two) times daily.   vitamin C 250 MG tablet Commonly known as: ASCORBIC ACID Take 1,000 mg by mouth 2 (two) times daily.     ASK your doctor about these medications    stroke: mapping our early stages of recovery  book Misc 1 each by Does not apply route once for 1 dose. Ask about: Should I take this medication?       Vitals:   11/01/19 0854 11/01/19 1147  BP: 127/80 (!) 84/54  Pulse: (!) 109 63  Resp:    Temp:    SpO2:      .[name]

## 2019-11-01 NOTE — Telephone Encounter (Signed)
Patient has been DC to Rehab [facility], patient says he is getting settled in he has decided to wait until he is DC from [facility] before switching to insulin pump.

## 2019-11-01 NOTE — TOC Transition Note (Signed)
Transition of Care [facility]) - CM/SW Discharge Note   Patient Details  Name: [name] MRN: [id number] Date of Birth: 12/18/1970  Transition of Care [facility]) CM/SW Contact:  Beverly Sessions, RN Phone Number: 11/01/2019, 5:44 PM   Clinical Narrative:    Patient to discharge back to [facility] today EMS packet was placed on chart.  Discharge info sent in [place]         Patient Goals and CMS Choice        Discharge Placement                       Discharge Plan and Services                                     Social Determinants of Health (SDOH) Interventions     Readmission Risk Interventions No flowsheet data found.

## 2019-11-01 NOTE — Telephone Encounter (Signed)
Spoke with Maudie Mercury at [employer] and reviewed that she could mail monitor back in the box that she has. I will reach back out on Monday to try and discuss options with Donnivan and we can either mail another one or set him up for insertion of loop. She verbalized understanding with no further questions at this time.

## 2019-11-02 NOTE — Telephone Encounter (Signed)
Order placed for endocrinology referral.

## 2019-11-03 ENCOUNTER — Inpatient Hospital Stay
Admission: EM | Admit: 2019-11-03 | Discharge: 2019-11-09 | DRG: 981 | Disposition: A | Discharge status: Home Health | Payer: Medicare Other | Attending: Family Medicine | Admitting: Family Medicine

## 2019-11-03 ENCOUNTER — Encounter: Payer: Self-pay | Admitting: Emergency Medicine

## 2019-11-03 DIAGNOSIS — E1165 Type 2 diabetes mellitus with hyperglycemia: Secondary | ICD-10-CM | POA: Diagnosis not present

## 2019-11-03 DIAGNOSIS — N319 Neuromuscular dysfunction of bladder, unspecified: Secondary | ICD-10-CM | POA: Diagnosis present

## 2019-11-03 DIAGNOSIS — Z981 Arthrodesis status: Secondary | ICD-10-CM | POA: Diagnosis not present

## 2019-11-03 DIAGNOSIS — K644 Residual hemorrhoidal skin tags: Secondary | ICD-10-CM | POA: Diagnosis present

## 2019-11-03 DIAGNOSIS — Z66 Do not resuscitate: Secondary | ICD-10-CM | POA: Diagnosis not present

## 2019-11-03 DIAGNOSIS — Z8673 Personal history of transient ischemic attack (TIA), and cerebral infarction without residual deficits: Secondary | ICD-10-CM

## 2019-11-03 DIAGNOSIS — F419 Anxiety disorder, unspecified: Secondary | ICD-10-CM | POA: Diagnosis present

## 2019-11-03 DIAGNOSIS — E0810 Diabetes mellitus due to underlying condition with ketoacidosis without coma: Secondary | ICD-10-CM

## 2019-11-03 DIAGNOSIS — Z87440 Personal history of urinary (tract) infections: Secondary | ICD-10-CM

## 2019-11-03 DIAGNOSIS — D649 Anemia, unspecified: Secondary | ICD-10-CM | POA: Diagnosis present

## 2019-11-03 DIAGNOSIS — R410 Disorientation, unspecified: Secondary | ICD-10-CM | POA: Diagnosis present

## 2019-11-03 DIAGNOSIS — Z8249 Family history of ischemic heart disease and other diseases of the circulatory system: Secondary | ICD-10-CM

## 2019-11-03 DIAGNOSIS — Z03818 Encounter for observation for suspected exposure to other biological agents ruled out: Secondary | ICD-10-CM | POA: Diagnosis not present

## 2019-11-03 DIAGNOSIS — Z79899 Other long term (current) drug therapy: Secondary | ICD-10-CM

## 2019-11-03 DIAGNOSIS — Z794 Long term (current) use of insulin: Secondary | ICD-10-CM | POA: Diagnosis not present

## 2019-11-03 DIAGNOSIS — K219 Gastro-esophageal reflux disease without esophagitis: Secondary | ICD-10-CM | POA: Diagnosis not present

## 2019-11-03 DIAGNOSIS — Z8349 Family history of other endocrine, nutritional and metabolic diseases: Secondary | ICD-10-CM

## 2019-11-03 DIAGNOSIS — Z882 Allergy status to sulfonamides status: Secondary | ICD-10-CM

## 2019-11-03 DIAGNOSIS — E1110 Type 2 diabetes mellitus with ketoacidosis without coma: Secondary | ICD-10-CM | POA: Diagnosis present

## 2019-11-03 DIAGNOSIS — E7211 Homocystinuria: Secondary | ICD-10-CM | POA: Diagnosis present

## 2019-11-03 DIAGNOSIS — E1069 Type 1 diabetes mellitus with other specified complication: Secondary | ICD-10-CM | POA: Diagnosis not present

## 2019-11-03 DIAGNOSIS — Z743 Need for continuous supervision: Secondary | ICD-10-CM | POA: Diagnosis not present

## 2019-11-03 DIAGNOSIS — M2550 Pain in unspecified joint: Secondary | ICD-10-CM | POA: Diagnosis not present

## 2019-11-03 DIAGNOSIS — D62 Acute posthemorrhagic anemia: Secondary | ICD-10-CM

## 2019-11-03 DIAGNOSIS — Z20828 Contact with and (suspected) exposure to other viral communicable diseases: Secondary | ICD-10-CM | POA: Diagnosis present

## 2019-11-03 DIAGNOSIS — R109 Unspecified abdominal pain: Secondary | ICD-10-CM | POA: Diagnosis not present

## 2019-11-03 DIAGNOSIS — D539 Nutritional anemia, unspecified: Secondary | ICD-10-CM | POA: Diagnosis not present

## 2019-11-03 DIAGNOSIS — G9341 Metabolic encephalopathy: Secondary | ICD-10-CM | POA: Diagnosis present

## 2019-11-03 DIAGNOSIS — E876 Hypokalemia: Secondary | ICD-10-CM | POA: Diagnosis present

## 2019-11-03 DIAGNOSIS — I959 Hypotension, unspecified: Secondary | ICD-10-CM | POA: Diagnosis not present

## 2019-11-03 DIAGNOSIS — I10 Essential (primary) hypertension: Secondary | ICD-10-CM | POA: Diagnosis present

## 2019-11-03 DIAGNOSIS — E1010 Type 1 diabetes mellitus with ketoacidosis without coma: Principal | ICD-10-CM

## 2019-11-03 DIAGNOSIS — G904 Autonomic dysreflexia: Secondary | ICD-10-CM | POA: Diagnosis not present

## 2019-11-03 DIAGNOSIS — G8220 Paraplegia, unspecified: Secondary | ICD-10-CM | POA: Diagnosis present

## 2019-11-03 DIAGNOSIS — Z888 Allergy status to other drugs, medicaments and biological substances status: Secondary | ICD-10-CM

## 2019-11-03 DIAGNOSIS — E860 Dehydration: Secondary | ICD-10-CM | POA: Diagnosis present

## 2019-11-03 DIAGNOSIS — Z7401 Bed confinement status: Secondary | ICD-10-CM | POA: Diagnosis not present

## 2019-11-03 DIAGNOSIS — N179 Acute kidney failure, unspecified: Secondary | ICD-10-CM | POA: Diagnosis not present

## 2019-11-03 DIAGNOSIS — L8990 Pressure ulcer of unspecified site, unspecified stage: Secondary | ICD-10-CM | POA: Diagnosis not present

## 2019-11-03 DIAGNOSIS — E109 Type 1 diabetes mellitus without complications: Secondary | ICD-10-CM | POA: Diagnosis present

## 2019-11-03 DIAGNOSIS — I6389 Other cerebral infarction: Secondary | ICD-10-CM | POA: Diagnosis not present

## 2019-11-03 DIAGNOSIS — R52 Pain, unspecified: Secondary | ICD-10-CM

## 2019-11-03 DIAGNOSIS — M25519 Pain in unspecified shoulder: Secondary | ICD-10-CM | POA: Diagnosis not present

## 2019-11-03 DIAGNOSIS — I63443 Cerebral infarction due to embolism of bilateral cerebellar arteries: Secondary | ICD-10-CM | POA: Diagnosis not present

## 2019-11-03 DIAGNOSIS — Z7982 Long term (current) use of aspirin: Secondary | ICD-10-CM

## 2019-11-03 LAB — URINALYSIS, COMPLETE (UACMP) WITH MICROSCOPIC
Bacteria, UA: NONE SEEN
Bilirubin Urine: NEGATIVE
Glucose, UA: >=500 mg/dL — AB
Hgb urine dipstick: NEGATIVE
Ketones, ur: 80 mg/dL — AB
Leukocytes,Ua: NEGATIVE
Nitrite: NEGATIVE
Protein, ur: NEGATIVE mg/dL
Specific Gravity, Urine: 1.016 (ref 1.005–1.030)
pH: 6 (ref 5.0–8.0)

## 2019-11-03 LAB — BASIC METABOLIC PANEL
Anion gap: 24 — ABNORMAL HIGH (ref 5–15)
Anion gap: 19 — ABNORMAL HIGH (ref 5–15)
Anion gap: 14 (ref 5–15)
Anion gap: 16 — ABNORMAL HIGH (ref 5–15)
Anion gap: 14 (ref 5–15)
BUN: 33 mg/dL — ABNORMAL HIGH (ref 6–20)
BUN: 36 mg/dL — ABNORMAL HIGH (ref 6–20)
BUN: 35 mg/dL — ABNORMAL HIGH (ref 6–20)
BUN: 30 mg/dL — ABNORMAL HIGH (ref 6–20)
BUN: 29 mg/dL — ABNORMAL HIGH (ref 6–20)
CO2: 9 mmol/L — ABNORMAL LOW (ref 22–32)
CO2: 13 mmol/L — ABNORMAL LOW (ref 22–32)
CO2: 18 mmol/L — ABNORMAL LOW (ref 22–32)
CO2: 14 mmol/L — ABNORMAL LOW (ref 22–32)
CO2: 17 mmol/L — ABNORMAL LOW (ref 22–32)
Calcium: 8 mg/dL — ABNORMAL LOW (ref 8.9–10.3)
Calcium: 8.2 mg/dL — ABNORMAL LOW (ref 8.9–10.3)
Calcium: 8.4 mg/dL — ABNORMAL LOW (ref 8.9–10.3)
Calcium: 8.2 mg/dL — ABNORMAL LOW (ref 8.9–10.3)
Calcium: 8.5 mg/dL — ABNORMAL LOW (ref 8.9–10.3)
Chloride: 93 mmol/L — ABNORMAL LOW (ref 98–111)
Chloride: 97 mmol/L — ABNORMAL LOW (ref 98–111)
Chloride: 99 mmol/L (ref 98–111)
Chloride: 100 mmol/L (ref 98–111)
Chloride: 100 mmol/L (ref 98–111)
Creatinine, Ser: 1.59 mg/dL — ABNORMAL HIGH (ref 0.61–1.24)
Creatinine, Ser: 1.3 mg/dL — ABNORMAL HIGH (ref 0.61–1.24)
Creatinine, Ser: 1.12 mg/dL (ref 0.61–1.24)
Creatinine, Ser: 1 mg/dL (ref 0.61–1.24)
Creatinine, Ser: 0.87 mg/dL (ref 0.61–1.24)
GFR calc Af Amer: 58 mL/min — ABNORMAL LOW (ref >60)
GFR calc Af Amer: >60 mL/min (ref >60)
GFR calc Af Amer: >60 mL/min (ref >60)
GFR calc Af Amer: >60 mL/min (ref >60)
GFR calc Af Amer: >60 mL/min (ref >60)
GFR calc non Af Amer: 50 mL/min — ABNORMAL LOW (ref >60)
GFR calc non Af Amer: >60 mL/min (ref >60)
GFR calc non Af Amer: >60 mL/min (ref >60)
GFR calc non Af Amer: >60 mL/min (ref >60)
GFR calc non Af Amer: >60 mL/min (ref >60)
Glucose, Bld: 575 mg/dL (ref 70–99)
Glucose, Bld: 243 mg/dL — ABNORMAL HIGH (ref 70–99)
Glucose, Bld: 175 mg/dL — ABNORMAL HIGH (ref 70–99)
Glucose, Bld: 156 mg/dL — ABNORMAL HIGH (ref 70–99)
Glucose, Bld: 162 mg/dL — ABNORMAL HIGH (ref 70–99)
Potassium: 4.5 mmol/L (ref 3.5–5.1)
Potassium: 3.7 mmol/L (ref 3.5–5.1)
Potassium: 3.6 mmol/L (ref 3.5–5.1)
Potassium: 3.9 mmol/L (ref 3.5–5.1)
Potassium: 3.9 mmol/L (ref 3.5–5.1)
Sodium: 126 mmol/L — ABNORMAL LOW (ref 135–145)
Sodium: 129 mmol/L — ABNORMAL LOW (ref 135–145)
Sodium: 131 mmol/L — ABNORMAL LOW (ref 135–145)
Sodium: 130 mmol/L — ABNORMAL LOW (ref 135–145)
Sodium: 131 mmol/L — ABNORMAL LOW (ref 135–145)

## 2019-11-03 LAB — CBC WITH DIFFERENTIAL/PLATELET
Abs Immature Granulocytes: 0.08 10*3/uL — ABNORMAL HIGH (ref 0.00–0.07)
Basophils Absolute: 0 10*3/uL (ref 0.0–0.1)
Basophils Relative: 0 %
Eosinophils Absolute: 0 10*3/uL (ref 0.0–0.5)
Eosinophils Relative: 0 %
HCT: 32.7 % — ABNORMAL LOW (ref 39.0–52.0)
Hemoglobin: 10.6 g/dL — ABNORMAL LOW (ref 13.0–17.0)
Immature Granulocytes: 1 %
Lymphocytes Relative: 4 %
Lymphs Abs: 0.4 10*3/uL — ABNORMAL LOW (ref 0.7–4.0)
MCH: 32.8 pg (ref 26.0–34.0)
MCHC: 32.4 g/dL (ref 30.0–36.0)
MCV: 101.2 fL — ABNORMAL HIGH (ref 80.0–100.0)
Monocytes Absolute: 0.4 10*3/uL (ref 0.1–1.0)
Monocytes Relative: 4 %
Neutro Abs: 10.2 10*3/uL — ABNORMAL HIGH (ref 1.7–7.7)
Neutrophils Relative %: 91 %
Platelets: 322 10*3/uL (ref 150–400)
RBC: 3.23 MIL/uL — ABNORMAL LOW (ref 4.22–5.81)
RDW: 12.5 % (ref 11.5–15.5)
WBC: 11.2 10*3/uL — ABNORMAL HIGH (ref 4.0–10.5)
nRBC: 0 % (ref 0.0–0.2)

## 2019-11-03 LAB — TSH: TSH: 1.975 u[IU]/mL (ref 0.350–4.500)

## 2019-11-03 LAB — BLOOD GAS, VENOUS
Acid-base deficit: 17.2 mmol/L — ABNORMAL HIGH (ref 0.0–2.0)
Bicarbonate: 9.2 mmol/L — ABNORMAL LOW (ref 20.0–28.0)
O2 Saturation: 66.4 %
Patient temperature: 37
pCO2, Ven: 24 mmHg — ABNORMAL LOW (ref 44.0–60.0)
pH, Ven: 7.19 — CL (ref 7.250–7.430)
pO2, Ven: 44 mmHg (ref 32.0–45.0)

## 2019-11-03 LAB — COMPREHENSIVE METABOLIC PANEL
ALT: 16 U/L (ref 0–44)
AST: 28 U/L (ref 15–41)
Albumin: 3.4 g/dL — ABNORMAL LOW (ref 3.5–5.0)
Alkaline Phosphatase: 84 U/L (ref 38–126)
Anion gap: 28 — ABNORMAL HIGH (ref 5–15)
BUN: 33 mg/dL — ABNORMAL HIGH (ref 6–20)
CO2: 8 mmol/L — ABNORMAL LOW (ref 22–32)
Calcium: 8.5 mg/dL — ABNORMAL LOW (ref 8.9–10.3)
Chloride: 87 mmol/L — ABNORMAL LOW (ref 98–111)
Creatinine, Ser: 1.55 mg/dL — ABNORMAL HIGH (ref 0.61–1.24)
GFR calc Af Amer: >60 mL/min (ref >60)
GFR calc non Af Amer: 52 mL/min — ABNORMAL LOW (ref >60)
Glucose, Bld: 680 mg/dL (ref 70–99)
Potassium: 5.2 mmol/L — ABNORMAL HIGH (ref 3.5–5.1)
Sodium: 123 mmol/L — ABNORMAL LOW (ref 135–145)
Total Bilirubin: 2.3 mg/dL — ABNORMAL HIGH (ref 0.3–1.2)
Total Protein: 6.1 g/dL — ABNORMAL LOW (ref 6.5–8.1)

## 2019-11-03 LAB — GLUCOSE, CAPILLARY
Glucose-Capillary: >600 mg/dL (ref 70–99)
Glucose-Capillary: 567 mg/dL (ref 70–99)
Glucose-Capillary: >600 mg/dL (ref 70–99)
Glucose-Capillary: 508 mg/dL (ref 70–99)
Glucose-Capillary: 544 mg/dL (ref 70–99)
Glucose-Capillary: 409 mg/dL — ABNORMAL HIGH (ref 70–99)
Glucose-Capillary: 454 mg/dL — ABNORMAL HIGH (ref 70–99)
Glucose-Capillary: 312 mg/dL — ABNORMAL HIGH (ref 70–99)
Glucose-Capillary: 234 mg/dL — ABNORMAL HIGH (ref 70–99)
Glucose-Capillary: 244 mg/dL — ABNORMAL HIGH (ref 70–99)
Glucose-Capillary: 242 mg/dL — ABNORMAL HIGH (ref 70–99)
Glucose-Capillary: 248 mg/dL — ABNORMAL HIGH (ref 70–99)
Glucose-Capillary: 221 mg/dL — ABNORMAL HIGH (ref 70–99)
Glucose-Capillary: 180 mg/dL — ABNORMAL HIGH (ref 70–99)
Glucose-Capillary: 145 mg/dL — ABNORMAL HIGH (ref 70–99)
Glucose-Capillary: 156 mg/dL — ABNORMAL HIGH (ref 70–99)
Glucose-Capillary: 147 mg/dL — ABNORMAL HIGH (ref 70–99)
Glucose-Capillary: 166 mg/dL — ABNORMAL HIGH (ref 70–99)
Glucose-Capillary: 157 mg/dL — ABNORMAL HIGH (ref 70–99)
Glucose-Capillary: 181 mg/dL — ABNORMAL HIGH (ref 70–99)
Glucose-Capillary: 163 mg/dL — ABNORMAL HIGH (ref 70–99)
Glucose-Capillary: 152 mg/dL — ABNORMAL HIGH (ref 70–99)
Glucose-Capillary: 157 mg/dL — ABNORMAL HIGH (ref 70–99)

## 2019-11-03 LAB — TROPONIN I (HIGH SENSITIVITY): Troponin I (High Sensitivity): 137 ng/L (ref <18)

## 2019-11-03 LAB — BILIRUBIN, FRACTIONATED(TOT/DIR/INDIR)
Bilirubin, Direct: 0.1 mg/dL (ref 0.0–0.2)
Indirect Bilirubin: 1.9 mg/dL — ABNORMAL HIGH (ref 0.3–0.9)
Total Bilirubin: 2 mg/dL — ABNORMAL HIGH (ref 0.3–1.2)

## 2019-11-03 LAB — BETA-HYDROXYBUTYRIC ACID
Beta-Hydroxybutyric Acid: >8 mmol/L — ABNORMAL HIGH (ref 0.05–0.27)
Beta-Hydroxybutyric Acid: 5.94 mmol/L — ABNORMAL HIGH (ref 0.05–0.27)
Beta-Hydroxybutyric Acid: 5.38 mmol/L — ABNORMAL HIGH (ref 0.05–0.27)

## 2019-11-03 LAB — RETICULOCYTES
Immature Retic Fract: 13 % (ref 2.3–15.9)
RBC.: 3 MIL/uL — ABNORMAL LOW (ref 4.22–5.81)
Retic Count, Absolute: 81.3 10*3/uL (ref 19.0–186.0)
Retic Ct Pct: 2.7 % (ref 0.4–3.1)

## 2019-11-03 LAB — LACTIC ACID, PLASMA
Lactic Acid, Venous: 1.6 mmol/L (ref 0.5–1.9)
Lactic Acid, Venous: 1.6 mmol/L (ref 0.5–1.9)

## 2019-11-03 LAB — VITAMIN B12: Vitamin B-12: 4972 pg/mL — ABNORMAL HIGH (ref 180–914)

## 2019-11-03 LAB — PATHOLOGIST SMEAR REVIEW

## 2019-11-03 LAB — SARS CORONAVIRUS 2 BY RT PCR (HOSPITAL ORDER, PERFORMED IN ~~LOC~~ HOSPITAL LAB): SARS Coronavirus 2: NEGATIVE

## 2019-11-03 MED ORDER — DEXTROSE 50 % IV SOLN: 0.0000 mL | INTRAVENOUS | Status: DC | PRN

## 2019-11-03 MED ORDER — POTASSIUM CHLORIDE IN NACL 20-0.9 MEQ/L-% IV SOLN
Freq: Once | INTRAVENOUS | Status: AC
  Administered 2019-11-03: 03:00:00 via INTRAVENOUS
  Filled 2019-11-03: qty 1000

## 2019-11-03 MED ORDER — SODIUM CHLORIDE 0.9 % IV SOLN
INTRAVENOUS | Status: DC
  Administered 2019-11-03: 05:00:00 via INTRAVENOUS

## 2019-11-03 MED ORDER — INSULIN REGULAR(HUMAN) IN NACL 100-0.9 UT/100ML-% IV SOLN
INTRAVENOUS | Status: DC
  Administered 2019-11-03: 8 [IU]/h via INTRAVENOUS
  Filled 2019-11-03: qty 100

## 2019-11-03 MED ORDER — ONDANSETRON HCL 4 MG/2ML IJ SOLN: 4.0000 mg | Freq: Four times a day (QID) | INTRAMUSCULAR | Status: DC | PRN

## 2019-11-03 MED ORDER — INSULIN GLARGINE 100 UNIT/ML ~~LOC~~ SOLN
5.0000 [IU] | Freq: Every day | SUBCUTANEOUS | Status: DC
  Administered 2019-11-03: 5 [IU] via SUBCUTANEOUS
  Filled 2019-11-03 (×2): qty 0.05

## 2019-11-03 MED ORDER — MORPHINE SULFATE (PF) 2 MG/ML IV SOLN
INTRAVENOUS | Status: AC
  Administered 2019-11-03: 1 mg via INTRAVENOUS
  Filled 2019-11-03: qty 1

## 2019-11-03 MED ORDER — LORAZEPAM 2 MG/ML IJ SOLN
0.2500 mg | Freq: Once | INTRAMUSCULAR | Status: AC
  Administered 2019-11-03: 08:00:00 0.25 mg via INTRAVENOUS

## 2019-11-03 MED ORDER — ACETAMINOPHEN 325 MG PO TABS
650.0000 mg | ORAL_TABLET | Freq: Four times a day (QID) | ORAL | Status: DC | PRN
  Administered 2019-11-04 – 2019-11-08 (×6): 650 mg via ORAL
  Filled 2019-11-03 (×7): qty 2

## 2019-11-03 MED ORDER — MIDAZOLAM HCL 2 MG/2ML IJ SOLN
INTRAMUSCULAR | Status: AC
  Filled 2019-11-03: qty 4

## 2019-11-03 MED ORDER — METHENAMINE MANDELATE 1 G PO TABS
1000.0000 mg | ORAL_TABLET | Freq: Two times a day (BID) | ORAL | Status: DC
  Administered 2019-11-04 – 2019-11-09 (×11): 1000 mg via ORAL
  Filled 2019-11-03 (×17): qty 1

## 2019-11-03 MED ORDER — ONDANSETRON HCL 4 MG PO TABS: 4.0000 mg | ORAL_TABLET | Freq: Four times a day (QID) | ORAL | Status: DC | PRN

## 2019-11-03 MED ORDER — MORPHINE SULFATE (PF) 2 MG/ML IV SOLN
1.0000 mg | INTRAVENOUS | Status: DC | PRN
  Administered 2019-11-03 (×2): 1 mg via INTRAVENOUS
  Filled 2019-11-03: qty 1

## 2019-11-03 MED ORDER — MIDAZOLAM HCL 2 MG/2ML IJ SOLN: 4.0000 mg | Freq: Once | INTRAMUSCULAR | Status: DC

## 2019-11-03 MED ORDER — LORAZEPAM BOLUS VIA INFUSION: 0.2500 mg | Freq: Once | INTRAVENOUS | Status: DC

## 2019-11-03 MED ORDER — BUSPIRONE HCL 5 MG PO TABS
5.0000 mg | ORAL_TABLET | Freq: Two times a day (BID) | ORAL | Status: DC
  Administered 2019-11-04 – 2019-11-09 (×11): 5 mg via ORAL
  Filled 2019-11-03 (×14): qty 1

## 2019-11-03 MED ORDER — ASPIRIN EC 325 MG PO TBEC
325.0000 mg | DELAYED_RELEASE_TABLET | Freq: Every day | ORAL | Status: DC
  Administered 2019-11-04 – 2019-11-09 (×6): 325 mg via ORAL
  Filled 2019-11-03 (×6): qty 1

## 2019-11-03 MED ORDER — SUCRALFATE 1 G PO TABS
1.0000 g | ORAL_TABLET | Freq: Three times a day (TID) | ORAL | Status: DC
  Administered 2019-11-04 – 2019-11-09 (×16): 1 g via ORAL
  Filled 2019-11-03 (×16): qty 1

## 2019-11-03 MED ORDER — BACLOFEN 10 MG PO TABS
40.0000 mg | ORAL_TABLET | Freq: Three times a day (TID) | ORAL | Status: DC | PRN
  Administered 2019-11-04 – 2019-11-09 (×13): 40 mg via ORAL
  Filled 2019-11-03 (×16): qty 4

## 2019-11-03 MED ORDER — HYDROCORTISONE NA SUCCINATE PF 100 MG IJ SOLR
50.0000 mg | Freq: Four times a day (QID) | INTRAMUSCULAR | Status: AC
  Administered 2019-11-03 (×2): 50 mg via INTRAVENOUS
  Filled 2019-11-03: qty 2

## 2019-11-03 MED ORDER — LORAZEPAM 2 MG/ML IJ SOLN
INTRAMUSCULAR | Status: AC
  Administered 2019-11-03: 0.25 mg via INTRAVENOUS
  Filled 2019-11-03: qty 1

## 2019-11-03 MED ORDER — DEXTROSE-NACL 5-0.45 % IV SOLN
INTRAVENOUS | Status: DC
  Administered 2019-11-03: 09:00:00 via INTRAVENOUS

## 2019-11-03 MED ORDER — HALOPERIDOL LACTATE 5 MG/ML IJ SOLN
1.0000 mg | Freq: Once | INTRAMUSCULAR | Status: AC
  Administered 2019-11-03: 1 mg via INTRAVENOUS
  Filled 2019-11-03: qty 1

## 2019-11-03 MED ORDER — PANTOPRAZOLE SODIUM 40 MG PO TBEC
40.0000 mg | DELAYED_RELEASE_TABLET | Freq: Every day | ORAL | Status: DC
  Administered 2019-11-04 – 2019-11-09 (×6): 40 mg via ORAL
  Filled 2019-11-03 (×6): qty 1

## 2019-11-03 MED ORDER — DEXTROSE-NACL 5-0.45 % IV SOLN: INTRAVENOUS | Status: DC

## 2019-11-03 MED ORDER — ACETAMINOPHEN 650 MG RE SUPP: 650.0000 mg | Freq: Four times a day (QID) | RECTAL | Status: DC | PRN

## 2019-11-03 MED ORDER — ENOXAPARIN SODIUM 40 MG/0.4ML ~~LOC~~ SOLN
40.0000 mg | SUBCUTANEOUS | Status: DC
  Administered 2019-11-04 – 2019-11-09 (×6): 40 mg via SUBCUTANEOUS
  Filled 2019-11-03 (×8): qty 0.4

## 2019-11-03 MED ORDER — SODIUM CHLORIDE 0.9 % IV BOLUS
1000.0000 mL | Freq: Once | INTRAVENOUS | Status: AC
  Administered 2019-11-03: 1000 mL via INTRAVENOUS

## 2019-11-03 MED ORDER — OXYCODONE HCL 5 MG PO TABS
5.0000 mg | ORAL_TABLET | ORAL | Status: DC | PRN
  Administered 2019-11-04 – 2019-11-09 (×20): 5 mg via ORAL
  Filled 2019-11-03 (×20): qty 1

## 2019-11-03 MED ORDER — FLUDROCORTISONE ACETATE 0.1 MG PO TABS: 0.1000 mg | ORAL_TABLET | Freq: Once | ORAL | Status: DC

## 2019-11-03 NOTE — ED Notes (Addendum)
Dr. Kurtis Bushman (admitting doc) in to see pt. Spoke with pts mother. This tech will continue to monitor pt as 1:1 sitter.

## 2019-11-03 NOTE — ED Notes (Signed)
Per NP Ouma, pt should stay on insulin drip at this time d/t anion gap increased to 16 and CO2 14 at 1807. Pt received basal insulin (lantus) at 1800. Pt remains altered, but less agitated than previous shift.

## 2019-11-03 NOTE — ED Notes (Signed)
VORB for 24m IM versed from Dr. RQuentin Cornwalldue to no new orders or assessments from admitting MD and patient continuing to thrash in bed with staff. Pt noted to be calming down at this time with Dorian, EDT at bedside as 1:1 sitter, this RN at bedside, and LMickel Baas RTherapist, sportsat bedside. Will hold Versed at this RN due to change in patient status and re-update EDP at this time. Lights dimmed for patient comfort and to encourage and promote rest for patient. Seizure pads remain in place at this time. Pt tolerating monitoring equipment.

## 2019-11-03 NOTE — ED Notes (Signed)
Pt wants to be able to bed his arm. Drip changed to left hand.

## 2019-11-03 NOTE — ED Notes (Signed)
Pt restless in bed. Pt will not keep pulse ox or ekg cables on.
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
## 2019-11-03 NOTE — ED Notes (Signed)
Report given to [facility]
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
## 2019-11-03 NOTE — ED Notes (Addendum)
CBG is 152. Patient still resting comfortably. Will continue to monitor 1:1

## 2019-11-03 NOTE — ED Notes (Signed)
CBG is 157. Patient still resting. Will continue monitoring 1:1

## 2019-11-03 NOTE — ED Notes (Signed)
CBG 157 at this time. Pt sleeping at this time. Will continue to monitor as 1:1 sitter.

## 2019-11-03 NOTE — ED Notes (Signed)
Supply chain called regarding D5 1/2NS to initiate due to patient's CBG > 250.

## 2019-11-03 NOTE — ED Notes (Signed)
Admitting MD Danford aware of pt's bp. Reference MAR for new orders. Blood cultures obtained per md order and sent to lab.

## 2019-11-03 NOTE — ED Notes (Signed)
This RN and Mickel Baas, RN at bedside, pt noted to have pulled all monitoring equipment off and is noted to be thrashing around in bed upon arrival to bedside. This RN restarted 18g to R AC at this time. Pt's L shoulder brace replaced to pt's L shoulder at mother's request due to patient repeatedly thrashing around. This RN, Mickel Baas, RN, and Marya Amsler, RN moved patient to hospital bed for further patient comfort. Pt changed and placed into clean brief at this time. Mickel Baas, RN repeatedly messaging admitting MD via secure chat to notify of change in patient's status, orders for 0.68m Ativan IV given to LMickel Baas RN, administered to patient by LMickel Baas RN, no effect on patient what-so-ever. Admitting MD made aware and asked to come evaluate patient. Mittens placed on patient and seizure pads placed on bed by this RN and LMickel Baas RN to prevent patient from pulling at monitoring equipment or bedrails and pulling himself up in the bed. Pt is noted to be confused however with enough direction pt is able to follow commands by this RN to breathe in through his nose and out through his mouth. Pt repeatedly complains of pain to L shoulder then thrashes around in bed. Pt's mother at bedside during events at this time.

## 2019-11-03 NOTE — Progress Notes (Signed)
Inpatient Diabetes Program Recommendations  AACE/ADA: New Consensus Statement on Inpatient Glycemic Control   Target Ranges:  Prepandial:   less than 140 mg/dL      [facility] postprandial:   less than 180 mg/dL (1-2 hours)      Critically ill patients:  140 - 180 mg/dL  Results for [name], [name] (MRN [id number]) as of 11/03/2019 09:32  Ref. Range 11/03/2019 01:55 11/03/2019 03:30 11/03/2019 04:00 11/03/2019 04:47 11/03/2019 05:17 11/03/2019 05:55 11/03/2019 06:28 11/03/2019 07:11 11/03/2019 08:59  Glucose-Capillary Latest Ref Range: 70 - 99 mg/dL >600 (HH) >600 (HH) 567 (HH) 544 (HH) 508 (HH) 454 (H) 409 (H) 312 (H) 234 (H)    Review of Glycemic Control  Diabetes history: DM1 (makes NO insulin; requires basal, correction, and carb coverage insulin) Outpatient Diabetes medications: Levemir 29 units QHS, Humalog 1-6 units TID with meals Current orders for Inpatient glycemic control: IV insulin; Solucortef 50 mg Q4H  Inpatient Diabetes Program Recommendations:   Insulin at transition from IV to SQ insulin: Once acidosis is cleared and MD is ready to transition from IV to SQ insulin, please consider ordering Levemir 20 units Q24H, CBGs Q4H, Novolog 0-6 units Q4H, and Novolog 3 units TID with meals for meal coverage.  Discharge insulin regimen: At time of discharge, please prescribe basal, meal coverage, and correction insulin.  NOTE: Patient has DM1 and this is 4th admissions with DKA over the past month (was just discharged on 11/01/19) and is from [facility]. Per note by Jani Gravel, RN on 10/31/19 patient had reported that [facility] took all his insulin away from him which caused him to go into DKA. Jani Gravel, RN called nursing director at [facility] and was told that patient was checking glucose every 30 mins to 1 hour and giving himself insulin so they took his insulin away. Patient needs basal, correction, and carb coverage insulin as he has DM1 and makes no insulin. At  time of discharge, would recommend discharging patient on insulin regimen of basal, meal coverage, and correction (could use insulin regimen similar to SQ regimen used while inpatient if glucose is well controlled).    Thanks, Barnie Alderman, RN, MSN, CDE Diabetes Coordinator Inpatient Diabetes Program [phone] (Team Pager from 8am to 5pm)

## 2019-11-03 NOTE — ED Notes (Addendum)
Notified by EDT Dorian that pt's O2 sat on RA while asleep dropped to 89-90%. Pt place on n/c at 2lpm with sat 94%.

## 2019-11-03 NOTE — ED Notes (Signed)
This tech at bedside as 1:1 sitter; report given by Dorian, EDT. CBG checked:147. Pt sleeping at this time. Will continue to monitor.

## 2019-11-03 NOTE — Progress Notes (Signed)
PROGRESS NOTE    [name]  WCH:[id number] DOB: May 19, 1971 DOA: 11/03/2019 PCP: Einar Pheasant, MD    Brief Narrative:  [name] is a 48 y.o. M with hx paraplegia from [facility] in 1988, type 1 diabetes, and recent left shoulder surgery followed by admission for DKA and cryptogenic embolic stroke who turns to the hospital with confusion, hyperglycemia. Per report, the patient was just discharged to [facility] 2 days ago.  Since being there, the patient had been complaining to staff that they were not giving him enough insulin.  Then on the day of admission he had reported to staff that he was felt confused, glucose was too high to read and blood pressure was low, so EMS was called.  He has had no fever.  He denies chest discomfort, cough, abdominal pain.   Denies urinary symptoms. In the ER, blood pressure 84/41, he was confused. Heart rate normal, pulse ox normal.  Bicarb 8, glucose 680, anion gap elevated.  VBG showed pH 7.19.  Creatinine 1.55 from baseline 0.6.  Transaminases normal, total bilirubin 2.3.  WBC 11 point 2K, hemoglobin 10.6, down from 12, macrocytic.  Urinalysis without pyuria or hematuria or bacteria.  He was given a large bolus of IV fluids and started on insulin drip and the hospitalist service were asked to evaluate for admission. Mother reports that usually patient is good with managing his own diabetes and giving himself insulin but at the rehab they refused to let him do this.    Consultants:   none  Procedures: None  Antimicrobials:   None   Subjective: Mother at bedside.  Patient is sleeping and snoring.  He was given Ativan prior to me coming in due to agitation.  He had pulled out his IV lines.  Objective: Vitals:   11/03/19 1238 11/03/19 1330 11/03/19 1400 11/03/19 1430  BP:  (!) 105/46 (!) 105/56 (!) 93/53  Pulse: 67 (!) 55 65 (!) 59  Resp: 18 18 20  (!) 21  Temp:      TempSrc:      SpO2: 99% 99% 99% 99%   No intake or output  data in the 24 hours ending 11/03/19 1440 There were no vitals filed for this visit.  Examination:  General exam: Appears calm and comfortable , sleepy Respiratory system: anteriorly CTA, with poor respiratory effort Cardiovascular system:RRR, S1 & S2 heard No JVD, murmurs, rubs, gallops or clicks.  Gastrointestinal system: Abdomen is nondistended, soft and nontender.  Normal bowel sounds heard. Central nervous system: sleeping and snoring, unable to do full neuro exam  Extremities: no edema Skin: warm, dry      Data Reviewed: I have personally reviewed following labs and imaging studies  CBC: Recent Labs  Lab 10/30/19 0428 11/03/19 0159  WBC 6.8 11.2*  NEUTROABS 4.9 10.2*  HGB 12.9* 10.6*  HCT 36.4* 32.7*  MCV 90.3 101.2*  PLT 275 353   Basic Metabolic Panel: Recent Labs  Lab 10/28/19 0500 10/29/19 0431 10/30/19 0428 11/03/19 0159 11/03/19 0423 11/03/19 1000  NA 131* 131* 131* 123* 126* 129*  K 3.9 3.8 3.8 5.2* 4.5 3.7  CL 100 97* 90* 87* 93* 97*  CO2 24 24 30  8* 9* 13*  GLUCOSE 217* 152* 170* 680* 575* 243*  BUN 12 8 10  33* 33* 36*  CREATININE 0.67 0.59* 0.58* 1.55* 1.59* 1.30*  CALCIUM 8.0* 8.4* 8.9 8.5* 8.0* 8.2*  MG 1.9 1.7 1.8  --   --   --   PHOS  2.9 2.8  --   --   --   --    GFR: Estimated Creatinine Clearance: 73.4 mL/min (A) (by C-G formula based on SCr of 1.3 mg/dL (H)). Liver Function Tests: Recent Labs  Lab 10/28/19 0500 10/29/19 0431 10/30/19 0428 11/03/19 0159 11/03/19 0423  AST 28 33 27 28  --   ALT 16 17 19 16   --   ALKPHOS 59 83 91 84  --   BILITOT 0.4 0.6 0.8 2.3* 2.0*  PROT 5.0* 5.9* 6.5 6.1*  --   ALBUMIN 2.7* 3.2* 3.5 3.4*  --    No results for input(s): LIPASE, AMYLASE in the last 168 hours. No results for input(s): AMMONIA in the last 168 hours. Coagulation Profile: Recent Labs  Lab 10/30/19 0428  INR 1.0   Cardiac Enzymes: No results for input(s): CKTOTAL, CKMB, CKMBINDEX, TROPONINI in the last 168 hours. BNP (last  3 results) No results for input(s): PROBNP in the last 8760 hours. HbA1C: No results for input(s): HGBA1C in the last 72 hours. CBG: Recent Labs  Lab 11/03/19 1002 11/03/19 1104 11/03/19 1200 11/03/19 1302 11/03/19 1358  GLUCAP 244* 242* 248* 221* 180*   Lipid Profile: No results for input(s): CHOL, HDL, LDLCALC, TRIG, CHOLHDL, LDLDIRECT in the last 72 hours. Thyroid Function Tests: Recent Labs    11/03/19 0544  TSH 1.975   Anemia Panel: Recent Labs    11/03/19 0544  VITAMINB12 4,972*  RETICCTPCT 2.7   Sepsis Labs: Recent Labs  Lab 10/28/19 0500 11/03/19 0423 11/03/19 0959  LATICACIDVEN 1.4 1.6 1.6    Recent Results (from the past 240 hour(s))  Urine culture     Status: Abnormal   Collection Time: 10/26/19  2:39 AM   Specimen: Urine, Random  Result Value Ref Range Status   Specimen Description   Final    URINE, RANDOM Performed at [facility], [address] [place], [place] [address]    Special Requests   Final    NONE Performed at [facility], [address] [place], [place] [address]    Culture (A)  Final    <10,000 COLONIES/mL INSIGNIFICANT GROWTH Performed at [facility], [place] [address], [place], [place] [address]    Report Status 10/26/2019 FINAL  Final  SARS CORONAVIRUS 2 (TAT 6-24 HRS) Nasopharyngeal Nasopharyngeal Swab     Status: None   Collection Time: 10/26/19  2:39 AM   Specimen: Nasopharyngeal Swab  Result Value Ref Range Status   SARS Coronavirus 2 NEGATIVE NEGATIVE Final    Comment: (NOTE) SARS-CoV-2 target nucleic acids are NOT DETECTED. The SARS-CoV-2 RNA is generally detectable in upper and lower respiratory specimens during the acute phase of infection. Negative results do not preclude SARS-CoV-2 infection, do not rule out co-infections with other pathogens, and should not be used as the sole basis for treatment or other patient management decisions. Negative results must be combined with clinical  observations, patient history, and epidemiological information. The expected result is Negative. Fact Sheet for Patients: SugarRoll.be Fact Sheet for Healthcare Providers: https://www.woods-mathews.com/ This test is not yet approved or cleared by the [place] FDA and  has been authorized for detection and/or diagnosis of SARS-CoV-2 by FDA under an Emergency Use Authorization (EUA). This EUA will remain  in effect (meaning this test can be used) for the duration of the COVID-19 declaration under Section 56 4(b)(1) of the Act, 21 U.S.C. section 360bbb-3(b)(1), unless the authorization is terminated or revoked sooner. Performed at [facility], [place] [address], [place], [place] [address]   CULTURE, BLOOD (ROUTINE X 2) w Reflex to ID Panel     Status: None (Preliminary result)   Collection Time: 10/26/19  8:52 AM   Specimen: BLOOD  Result Value Ref Range Status   Specimen Description BLOOD RIGHT AC  Final   Special Requests   Final    BOTTLES DRAWN AEROBIC AND ANAEROBIC Blood Culture results may not be optimal due to an excessive volume of blood received in culture bottles   Culture   Final    NO GROWTH 4 DAYS Performed at [facility], [address] [place], [place] [address]    Report Status PENDING  Incomplete  CULTURE, BLOOD (ROUTINE X 2) w Reflex to ID Panel     Status: None (Preliminary result)   Collection Time: 10/26/19  8:52 AM   Specimen: BLOOD  Result Value Ref Range Status   Specimen Description BLOOD LEFT HAND  Final   Special Requests   Final    BOTTLES DRAWN AEROBIC AND ANAEROBIC Blood Culture adequate volume   Culture   Final    NO GROWTH 4 DAYS Performed at [facility], [address] [place], [place] [address]    Report Status PENDING  Incomplete  MRSA PCR Screening     Status: None   Collection Time: 10/26/19  2:28 PM   Specimen: Nasal Mucosa; Nasopharyngeal  Result Value Ref Range  Status   MRSA by PCR NEGATIVE NEGATIVE Final    Comment:        The GeneXpert MRSA Assay (FDA approved for NASAL specimens only), is one component of a comprehensive MRSA colonization surveillance program. It is not intended to diagnose MRSA infection nor to guide or monitor treatment for MRSA infections. Performed at [facility], [place], [place] [address]   SARS CORONAVIRUS 2 (TAT 6-24 HRS) Nasopharyngeal Nasopharyngeal Swab     Status: None   Collection Time: 10/30/19 10:47 PM   Specimen: Nasopharyngeal Swab  Result Value Ref Range Status   SARS Coronavirus 2 NEGATIVE NEGATIVE Final    Comment: (NOTE) SARS-CoV-2 target nucleic acids are NOT DETECTED. The SARS-CoV-2 RNA is generally detectable in upper and lower respiratory specimens during the acute phase of infection. Negative results do not preclude SARS-CoV-2 infection, do not rule out co-infections with other pathogens, and should not be used as the sole basis for treatment or other patient management decisions. Negative results must be combined with clinical observations, patient history, and epidemiological information. The expected result is Negative. Fact Sheet for Patients: SugarRoll.be Fact Sheet for Healthcare Providers: https://www.woods-mathews.com/ This test is not yet approved or cleared by the [place] FDA and  has been authorized for detection and/or diagnosis of SARS-CoV-2 by FDA under an Emergency Use Authorization (EUA). This EUA will remain  in effect (meaning this test can be used) for the duration of the COVID-19 declaration under Section 56 4(b)(1) of the Act, 21 U.S.C. section 360bbb-3(b)(1), unless the authorization is terminated or revoked sooner. Performed at [facility], [place] [address] [place], [place] [address]          Radiology Studies: No results found.      Scheduled Meds: . aspirin EC  325 mg  Oral Daily  . busPIRone  5 mg Oral BID  . enoxaparin (LOVENOX) injection  40 mg Subcutaneous Q24H  . hydrocortisone sod succinate (SOLU-CORTEF) inj  50 mg Intravenous Q6H  . methenamine  1,000 mg Oral BID  . midazolam  4 mg Intramuscular Once  . pantoprazole  40 mg Oral Daily  . sucralfate  1 g Oral TID AC   Continuous Infusions: . sodium chloride Stopped (11/03/19 0925)  . dextrose 5 % and 0.45% NaCl 75 mL/hr at 11/03/19 0929  . insulin 1.6 Units/hr (11/03/19 1427)    Assessment & Plan:   Principal Problem:   DKA (diabetic ketoacidoses) ([place]) Active Problems:   Type 1 diabetes mellitus ([place])   Hypotension   Anemia   Paraplegia ([place])   AKI (acute kidney injury) ([place])  DKA Presents with pH 7.19, glucose 680, anion gap 25. Possibly not receiving enough insulin at rehab. -Continue on IV fluids -On DKA protocol we will continue -Q. 4 BMP -beta hydroxybutyrate trending down   Hypotension history of "autonomic dysfunction" treated with Florinef.  During his last hospitalization he presented in a similar manner, notes suggest he was improved with fluids, but also placed on midodrine briefly at some point during that hospitalization.  Despite his BP < 90 mmHg systolic, there are no overt signs of infection, temp and WBC normal.  Certainly could be due to dehydration too -Solu-Cortef 50 MG Q6H for 24 hours  Acute metabolic encephalopathy Similar to previous presentation, was very altered when in DKA and with hypotension. -Appears to be improving slowly, if not baseline will obtain MRI brain to look for new embolic strokes  Recent stroke No previous stroke until last hospitalization, patient was admitted altered, had an unresponsive episode.  Follow-up EEG showed no epileptic activity.   Hypercoagulable work-up negative except for mild hyperhomocysteinemia nonspecific. Follow-up MRI showed multifocal strokes, suspect to be embolic.   TEE negative, loop recorder  implanted. -Continue aspirin  Hyponatremia  Likely secondary to DKA/hyperglycemia and dehydration Improving slowly with IV fluids Do not want to over correct to quickly Will decrease IVF rate to 9m/hr Monitor labs   acute kidney injury Baseline creatinine 0.6.   Likely due to prerenal azotemia Slowly improving with IV hydration We will continue to monitor   Elevated total bilirubin Suspect this is stress related -Trend and fractionate bilirubin in the morning  Hypertension -Hold metoprolol and hydralazine as he is hypotensive and dehydrated  Paraplegia Neurogenic bladder with recurrent UTI Was treated with ceftriaxone for UTI during last hospitalization.  Current urinalysis normal. -Continue baclofen -Continue oxycodone -Continue methenamine  GERD -Continue PPI, Carafate  Anxiety -Continue BuSpar  Macrocytic anemia Hemoglobin decreased from baseline.  Elevated homocysteine during coag work up last admission -> B12/folate?. -Check B12, folate -Check TSH -Check smear and retics      DVT prophylaxis: Lovenox  Code Status: DNR  Family Communication:   Disposition Plan:  Will likely be here more than 2 midnight stays until he is medically stable    LOS: 0 days   Time spent: 45 minutes with more than 50% COC    SNolberto [name] MD [facility] Pager [phone]  If 7PM-7AM, please contact night-coverage [email or web address] Password [email or web address] 11/03/2019, 2:40 PM

## 2019-11-03 NOTE — ED Notes (Signed)
No changes in insulin rate per endotool. Verified by Sinclair Grooms

## 2019-11-03 NOTE — ED Notes (Signed)
CBG checked: 166. Pt suctioned by this tech; pt sleeping comfortably at this time. Will continue to monitor as 1:1 sitter.

## 2019-11-03 NOTE — ED Notes (Signed)
Report given to on coming relief Jan

## 2019-11-03 NOTE — ED Notes (Signed)
Pt awoke, confused but oriented to name, year and birthday. Pt not aggressive but movements to adjust position are spastic. Pt obsessing over mitt on left hand. Pt able to agree to not pull IVs if mitt is taken off. Pt able to respond to verbal commands and take a few sips of water. This tech will continue to monitor as 1:1 sitter.

## 2019-11-03 NOTE — ED Notes (Signed)
Mother of pt left to go home and get some rest. Pt waking up every 10 minutes or so. Pts words are more articulated but pt still seems unable to control spastic arm movements when awake. Pt repeating some questions, even with questions being answered. This tech will continue to monitor as 1:1 sitter.

## 2019-11-03 NOTE — ED Notes (Signed)
CBG is 163. Patient is resting at this time. Will continue to monitor 1:1

## 2019-11-03 NOTE — ED Notes (Signed)
Lab contacted to draw VBG

## 2019-11-03 NOTE — ED Notes (Addendum)
Upon entering patient's room for 800 cbg, patient restless and agitated in bed. Mother at bedside attempting to keep patient calm. Both of patient's IV's had been removed due to patient's erratic behavior. NS and insulin paused. This RN and Jinny Blossom, RN attempting to keep patient calm. Patient had removed shoulder sling during the night and mother requesting to replace sling. This RN, Jinny Blossom, RN and Rush Landmark, RN able to get sling back onto patient. Also able to start another IV. Patient continued to have increasing agitation and increased risk of injury to self and others. Attending MD notified of situation and request for possible medication via secure chat. Order of .25 mg Ativan placed by MD. After administration of medication, patient continues to be agitated and MD made aware. This Rn, Jinny Blossom, RN and Marya Amsler, RN able to move patient into hospital bed and clean patient of stool. Condom cath remains in place. Awaiting further instructions at this time.

## 2019-11-03 NOTE — ED Notes (Signed)
Elmo Putt, RN in room. Pts leg bag completely emptied of 275 mL of urine. Pt sleeping at this time.  This tech will continue to monitor as 1:1 sitter.

## 2019-11-03 NOTE — ED Notes (Signed)
Pt awake still a little confused, but understands he is at the hospital. Pt a little restless; when asked if he is in pain he states "not like earlier I feel much better". Pt then continues to sleep waking up occasionally with muscle spasms. Will continue to monitor as 1:1 sitter.

## 2019-11-03 NOTE — ED Notes (Signed)
Lab called to collect remaining lavender tubes needed for blood test

## 2019-11-03 NOTE — ED Triage Notes (Signed)
Pt arrived via EMS from [facility] where pt reported to staff he felt "altered/confused." Pts blood sugar >600. [facility]. Pt is A&O x4 on arrival. Pt is quadriplegic.

## 2019-11-03 NOTE — ED Notes (Signed)
Pt agitated, attempting to pull IV lines. EDT at bedside holding pt's hands. Pt jerking L shoulder repeatedly. Pt altered, unable to take PO medication at this time. Nods when asked if he has pain. Requested IV pain medication for pt d/t recent L shoulder surgery. See orders.

## 2019-11-03 NOTE — ED Notes (Signed)
No change in insulin drip per endotool. Francoise Schaumann verified

## 2019-11-03 NOTE — ED Notes (Signed)
Report given to [place].

## 2019-11-03 NOTE — ED Notes (Signed)
No change in insulin per endotool

## 2019-11-03 NOTE — ED Notes (Signed)
No change in insulin drip per endotool

## 2019-11-03 NOTE — ED Notes (Signed)
While taking rectal temp pt found to have stool on his bottom. Pt has a dressing to the sacral area. Pt's bottom cleaned up and new brief provided

## 2019-11-03 NOTE — ED Notes (Signed)
Dr. Quentin Cornwall made aware Versed held due to patient responding to Ativan, noted to be more calm, resting in bed at this time. VSS. Mickel Baas, Primary RN also aware of situation at this time.

## 2019-11-03 NOTE — ED Notes (Addendum)
CBG is 181. Patient is resting at this time. Will continue to monitor 1:1

## 2019-11-04 LAB — GLUCOSE, CAPILLARY
Glucose-Capillary: 125 mg/dL — ABNORMAL HIGH (ref 70–99)
Glucose-Capillary: 129 mg/dL — ABNORMAL HIGH (ref 70–99)
Glucose-Capillary: 129 mg/dL — ABNORMAL HIGH (ref 70–99)
Glucose-Capillary: 144 mg/dL — ABNORMAL HIGH (ref 70–99)
Glucose-Capillary: 153 mg/dL — ABNORMAL HIGH (ref 70–99)
Glucose-Capillary: 157 mg/dL — ABNORMAL HIGH (ref 70–99)
Glucose-Capillary: 302 mg/dL — ABNORMAL HIGH (ref 70–99)
Glucose-Capillary: 78 mg/dL (ref 70–99)
Glucose-Capillary: 97 mg/dL (ref 70–99)

## 2019-11-04 LAB — BASIC METABOLIC PANEL
Anion gap: 12 (ref 5–15)
Anion gap: 12 (ref 5–15)
Anion gap: 13 (ref 5–15)
BUN: 27 mg/dL — ABNORMAL HIGH (ref 6–20)
BUN: 26 mg/dL — ABNORMAL HIGH (ref 6–20)
BUN: 24 mg/dL — ABNORMAL HIGH (ref 6–20)
CO2: 21 mmol/L — ABNORMAL LOW (ref 22–32)
CO2: 20 mmol/L — ABNORMAL LOW (ref 22–32)
CO2: 21 mmol/L — ABNORMAL LOW (ref 22–32)
Calcium: 8.7 mg/dL — ABNORMAL LOW (ref 8.9–10.3)
Calcium: 8.7 mg/dL — ABNORMAL LOW (ref 8.9–10.3)
Calcium: 8.9 mg/dL (ref 8.9–10.3)
Chloride: 97 mmol/L — ABNORMAL LOW (ref 98–111)
Chloride: 100 mmol/L (ref 98–111)
Chloride: 101 mmol/L (ref 98–111)
Creatinine, Ser: 0.8 mg/dL (ref 0.61–1.24)
Creatinine, Ser: 0.77 mg/dL (ref 0.61–1.24)
Creatinine, Ser: 0.68 mg/dL (ref 0.61–1.24)
GFR calc Af Amer: >60 mL/min (ref >60)
GFR calc Af Amer: >60 mL/min (ref >60)
GFR calc Af Amer: >60 mL/min (ref >60)
GFR calc non Af Amer: >60 mL/min (ref >60)
GFR calc non Af Amer: >60 mL/min (ref >60)
GFR calc non Af Amer: >60 mL/min (ref >60)
Glucose, Bld: 127 mg/dL — ABNORMAL HIGH (ref 70–99)
Glucose, Bld: 132 mg/dL — ABNORMAL HIGH (ref 70–99)
Glucose, Bld: 91 mg/dL (ref 70–99)
Potassium: 3.5 mmol/L (ref 3.5–5.1)
Potassium: 3.3 mmol/L — ABNORMAL LOW (ref 3.5–5.1)
Potassium: 3.3 mmol/L — ABNORMAL LOW (ref 3.5–5.1)
Sodium: 130 mmol/L — ABNORMAL LOW (ref 135–145)
Sodium: 132 mmol/L — ABNORMAL LOW (ref 135–145)
Sodium: 135 mmol/L (ref 135–145)

## 2019-11-04 LAB — BETA-HYDROXYBUTYRIC ACID: Beta-Hydroxybutyric Acid: 1.6 mmol/L — ABNORMAL HIGH (ref 0.05–0.27)

## 2019-11-04 MED ORDER — SODIUM BICARBONATE 650 MG PO TABS
650.0000 mg | ORAL_TABLET | Freq: Every day | ORAL | Status: DC
  Administered 2019-11-04 – 2019-11-07 (×4): 650 mg via ORAL
  Filled 2019-11-04 (×4): qty 1

## 2019-11-04 MED ORDER — POTASSIUM CHLORIDE CRYS ER 20 MEQ PO TBCR
40.0000 meq | EXTENDED_RELEASE_TABLET | Freq: Once | ORAL | Status: AC
  Administered 2019-11-04: 40 meq via ORAL
  Filled 2019-11-04: qty 2

## 2019-11-04 MED ORDER — INSULIN ASPART 100 UNIT/ML ~~LOC~~ SOLN
0.0000 [IU] | Freq: Three times a day (TID) | SUBCUTANEOUS | Status: DC
  Administered 2019-11-04: 18:00:00 1 [IU] via SUBCUTANEOUS
  Administered 2019-11-05: 4 [IU] via SUBCUTANEOUS
  Administered 2019-11-05: 2 [IU] via SUBCUTANEOUS
  Administered 2019-11-05: 5 [IU] via SUBCUTANEOUS
  Administered 2019-11-06: 1 [IU] via SUBCUTANEOUS
  Administered 2019-11-07: 2 [IU] via SUBCUTANEOUS
  Administered 2019-11-07: 1 [IU] via SUBCUTANEOUS
  Administered 2019-11-08: 4 [IU] via SUBCUTANEOUS
  Administered 2019-11-08: 1 [IU] via SUBCUTANEOUS
  Administered 2019-11-08: 2 [IU] via SUBCUTANEOUS
  Administered 2019-11-09: 1 [IU] via SUBCUTANEOUS
  Filled 2019-11-04 (×11): qty 1

## 2019-11-04 MED ORDER — METOPROLOL TARTRATE 25 MG PO TABS
25.0000 mg | ORAL_TABLET | Freq: Two times a day (BID) | ORAL | Status: DC
  Administered 2019-11-04: 25 mg via ORAL
  Filled 2019-11-04 (×2): qty 1

## 2019-11-04 MED ORDER — INSULIN ASPART 100 UNIT/ML ~~LOC~~ SOLN
0.0000 [IU] | Freq: Every day | SUBCUTANEOUS | Status: DC
  Administered 2019-11-04: 4 [IU] via SUBCUTANEOUS
  Administered 2019-11-05: 5 [IU] via SUBCUTANEOUS
  Administered 2019-11-06: 4 [IU] via SUBCUTANEOUS
  Filled 2019-11-04 (×3): qty 1

## 2019-11-04 MED ORDER — SODIUM CHLORIDE 0.9 % IV SOLN
INTRAVENOUS | Status: DC
  Administered 2019-11-04 (×2): via INTRAVENOUS

## 2019-11-04 MED ORDER — INSULIN ASPART 100 UNIT/ML ~~LOC~~ SOLN: 0.0000 [IU] | Freq: Three times a day (TID) | SUBCUTANEOUS | Status: DC

## 2019-11-04 MED ORDER — CHLORHEXIDINE GLUCONATE CLOTH 2 % EX PADS
6.0000 | MEDICATED_PAD | Freq: Every day | CUTANEOUS | Status: DC
  Administered 2019-11-04: 6 via TOPICAL
  Filled 2019-11-04: qty 6

## 2019-11-04 MED ORDER — INSULIN DETEMIR 100 UNIT/ML ~~LOC~~ SOLN
10.0000 [IU] | Freq: Every day | SUBCUTANEOUS | Status: DC
  Administered 2019-11-04: 10 [IU] via SUBCUTANEOUS
  Filled 2019-11-04 (×2): qty 0.1

## 2019-11-04 MED ORDER — INSULIN ASPART 100 UNIT/ML ~~LOC~~ SOLN
3.0000 [IU] | Freq: Three times a day (TID) | SUBCUTANEOUS | Status: DC
  Administered 2019-11-04 – 2019-11-08 (×10): 3 [IU] via SUBCUTANEOUS
  Filled 2019-11-04 (×10): qty 1

## 2019-11-04 MED ORDER — DIAZEPAM 2 MG PO TABS
2.0000 mg | ORAL_TABLET | Freq: Once | ORAL | Status: AC
  Administered 2019-11-04: 2 mg via ORAL
  Filled 2019-11-04: qty 1

## 2019-11-04 MED ORDER — VITAMIN C 500 MG PO TABS
250.0000 mg | ORAL_TABLET | Freq: Every day | ORAL | Status: DC
  Administered 2019-11-04 – 2019-11-06 (×3): 250 mg via ORAL
  Filled 2019-11-04 (×3): qty 1

## 2019-11-04 MED ORDER — INSULIN DETEMIR 100 UNIT/ML ~~LOC~~ SOLN
15.0000 [IU] | Freq: Every day | SUBCUTANEOUS | Status: DC
  Administered 2019-11-04: 15 [IU] via SUBCUTANEOUS
  Filled 2019-11-04 (×2): qty 0.15

## 2019-11-04 MED ORDER — METOPROLOL TARTRATE 25 MG PO TABS
25.0000 mg | ORAL_TABLET | Freq: Two times a day (BID) | ORAL | Status: DC
  Administered 2019-11-04 – 2019-11-07 (×6): 25 mg via ORAL
  Filled 2019-11-04 (×5): qty 1

## 2019-11-04 NOTE — Progress Notes (Signed)
Patient ID: [name], male   DOB: 1971-09-12, 48 y.o.   MRN: [id number]  PROGRESS NOTE    Jayse Hodkinson Kelty  OXB:[id number] DOB: Apr 26, 1971 DOA: 11/03/2019 PCP: Einar Pheasant, MD    Brief Narrative:  [name] is a 48 y.o. M with hx paraplegia from [facility] in 1988, type 1 diabetes, and recent left shoulder surgery followed by admission for DKA and cryptogenic embolic stroke who turns to the hospital with confusion, hyperglycemia. Per report, the patient was just [facility] ago.  Since being there, the patient had been complaining to staff that they were not giving him enough insulin.  Then on the day of admission he had reported to staff that he was felt confused, glucose was too high to read and blood pressure was low, so EMS was called.  He has had no fever.  He denies chest discomfort, cough, abdominal pain.   Denies urinary symptoms. In the ER, blood pressure 84/41, he was confused. Heart rate normal, pulse ox normal.  Bicarb 8, glucose 680, anion gap elevated.  VBG showed pH 7.19.  Creatinine 1.55 from baseline 0.6.  Transaminases normal, total bilirubin 2.3.  WBC 11 point 2K, hemoglobin 10.6, down from 12, macrocytic.  Urinalysis without pyuria or hematuria or bacteria.  He was given a large bolus of IV fluids and started on insulin drip and the hospitalist service were asked to evaluate for admission. Mother reports that usually patient is good with managing his own diabetes and giving himself insulin but at the rehab they refused to let him do this.    Consultants:   none  Procedures: None  Antimicrobials:   None   Subjective: Pt awake and alert, mentating well. Has no clinical complaints. Just asking questions about his insulin, how he doesn't like the bed, he doesn't like the food he is receiving.   Objective: Vitals:   11/04/19 0300 11/04/19 0400 11/04/19 0500 11/04/19 0600  BP: 133/65 122/63 116/71 (!) 155/84  Pulse: 81 71 66  82  Resp: 17 (!) 9 10 14   Temp:      TempSrc:      SpO2: 99% 97% 98% 99%  Weight:      Height:        Intake/Output Summary (Last 24 hours) at 11/04/2019 1247 Last data filed at 11/04/2019 0445 Gross per 24 hour  Intake 79.46 ml  Output 275 ml  Net -195.54 ml   Filed Weights   11/03/19 1958  Weight: 76 kg    Examination:  General exam: Appears calm and comfortable , NAD Respiratory system: Clear to auscultation, no wheeze rales rhonchi's Cardiovascular system:RRR, S1 & S2 heard No JVD, murmurs, rubs, gallops or clicks.  Gastrointestinal system: Abdomen is nondistended, soft and nontender.  Normal bowel sounds heard. Central nervous system: Alert oriented x3 neuro exam grossly intact Extremities: no edema Skin: warm, dry      Data Reviewed: I have personally reviewed following labs and imaging studies  CBC: Recent Labs  Lab 10/30/19 0428 11/03/19 0159  WBC 6.8 11.2*  NEUTROABS 4.9 10.2*  HGB 12.9* 10.6*  HCT 36.4* 32.7*  MCV 90.3 101.2*  PLT 275 683   Basic Metabolic Panel: Recent Labs  Lab 10/29/19 0431 10/30/19 0428  11/03/19 1807 11/03/19 2221 11/04/19 0218 11/04/19 0524 11/04/19 0926  NA 131* 131*   < > 130* 131* 130* 132* 135  K 3.8 3.8   < > 3.9 3.9 3.5 3.3* 3.3*  CL  97* 90*   < > 100 100 97* 100 101  CO2 24 30   < > 14* 17* 21* 20* 21*  GLUCOSE 152* 170*   < > 156* 162* 127* 132* 91  BUN 8 10   < > 30* 29* 27* 26* 24*  CREATININE 0.59* 0.58*   < > 1.00 0.87 0.80 0.77 0.68  CALCIUM 8.4* 8.9   < > 8.2* 8.5* 8.7* 8.7* 8.9  MG 1.7 1.8  --   --   --   --   --   --   PHOS 2.8  --   --   --   --   --   --   --    < > = values in this interval not displayed.   GFR: Estimated Creatinine Clearance: 120.1 mL/min (by C-G formula based on SCr of 0.68 mg/dL). Liver Function Tests: Recent Labs  Lab 10/29/19 0431 10/30/19 0428 11/03/19 0159 11/03/19 0423  AST 33 27 28  --   ALT 17 19 16   --   ALKPHOS 83 91 84  --   BILITOT 0.6 0.8 2.3* 2.0*   PROT 5.9* 6.5 6.1*  --   ALBUMIN 3.2* 3.5 3.4*  --    No results for input(s): LIPASE, AMYLASE in the last 168 hours. No results for input(s): AMMONIA in the last 168 hours. Coagulation Profile: Recent Labs  Lab 10/30/19 0428  INR 1.0   Cardiac Enzymes: No results for input(s): CKTOTAL, CKMB, CKMBINDEX, TROPONINI in the last 168 hours. BNP (last 3 results) No results for input(s): PROBNP in the last 8760 hours. HbA1C: No results for input(s): HGBA1C in the last 72 hours. CBG: Recent Labs  Lab 11/04/19 0239 11/04/19 0341 11/04/19 0441 11/04/19 0808 11/04/19 1147  GLUCAP 129* 125* 129* 97 78   Lipid Profile: No results for input(s): CHOL, HDL, LDLCALC, TRIG, CHOLHDL, LDLDIRECT in the last 72 hours. Thyroid Function Tests: Recent Labs    11/03/19 0544  TSH 1.975   Anemia Panel: Recent Labs    11/03/19 0544  VITAMINB12 4,972*  RETICCTPCT 2.7   Sepsis Labs: Recent Labs  Lab 11/03/19 0423 11/03/19 0959  LATICACIDVEN 1.6 1.6    Recent Results (from the past 240 hour(s))  Urine culture     Status: Abnormal   Collection Time: 10/26/19  2:39 AM   Specimen: Urine, Random  Result Value Ref Range Status   Specimen Description   Final    URINE, RANDOM Performed at [facility], [address] [place], [place] [address]    Special Requests   Final    NONE Performed at [facility], [address], [place], [place] [address]    Culture (A)  Final    <10,000 COLONIES/mL INSIGNIFICANT GROWTH Performed at [facility], [place] [address] [place], [place] [address]    Report Status 10/26/2019 FINAL  Final  SARS CORONAVIRUS 2 (TAT 6-24 HRS) Nasopharyngeal Nasopharyngeal Swab     Status: None   Collection Time: 10/26/19  2:39 AM   Specimen: Nasopharyngeal Swab  Result Value Ref Range Status   SARS Coronavirus 2 NEGATIVE NEGATIVE Final    Comment: (NOTE) SARS-CoV-2 target nucleic acids are NOT DETECTED. The SARS-CoV-2 RNA is generally  detectable in upper and lower respiratory specimens during the acute phase of infection. Negative results do not preclude SARS-CoV-2 infection, do not rule out co-infections with other pathogens, and should not be used as the sole basis for treatment or other patient  management decisions. Negative results must be combined with clinical observations, patient history, and epidemiological information. The expected result is Negative. Fact Sheet for Patients: SugarRoll.be Fact Sheet for Healthcare Providers: https://www.woods-mathews.com/ This test is not yet approved or cleared by the [place] FDA and  has been authorized for detection and/or diagnosis of SARS-CoV-2 by FDA under an Emergency Use Authorization (EUA). This EUA will remain  in effect (meaning this test can be used) for the duration of the COVID-19 declaration under Section 56 4(b)(1) of the Act, 21 U.S.C. section 360bbb-3(b)(1), unless the authorization is terminated or revoked sooner. Performed at [facility], [place] [address] [place], [place] [address]   CULTURE, BLOOD (ROUTINE X 2) w Reflex to ID Panel     Status: None (Preliminary result)   Collection Time: 10/26/19  8:52 AM   Specimen: BLOOD  Result Value Ref Range Status   Specimen Description BLOOD RIGHT AC  Final   Special Requests   Final    BOTTLES DRAWN AEROBIC AND ANAEROBIC Blood Culture results may not be optimal due to an excessive volume of blood received in culture bottles   Culture   Final    NO GROWTH 4 DAYS Performed at [facility], [address], [place], [place] [address]    Report Status PENDING  Incomplete  CULTURE, BLOOD (ROUTINE X 2) w Reflex to ID Panel     Status: None (Preliminary result)   Collection Time: 10/26/19  8:52 AM   Specimen: BLOOD  Result Value Ref Range Status   Specimen Description BLOOD LEFT HAND  Final   Special Requests   Final    BOTTLES DRAWN AEROBIC AND  ANAEROBIC Blood Culture adequate volume   Culture   Final    NO GROWTH 4 DAYS Performed at [facility], [address] [place], [place] [address]    Report Status PENDING  Incomplete  MRSA PCR Screening     Status: None   Collection Time: 10/26/19  2:28 PM   Specimen: Nasal Mucosa; Nasopharyngeal  Result Value Ref Range Status   MRSA by PCR NEGATIVE NEGATIVE Final    Comment:        The GeneXpert MRSA Assay (FDA approved for NASAL specimens only), is one component of a comprehensive MRSA colonization surveillance program. It is not intended to diagnose MRSA infection nor to guide or monitor treatment for MRSA infections. Performed at [facility], [place], [place] [address]   SARS CORONAVIRUS 2 (TAT 6-24 HRS) Nasopharyngeal Nasopharyngeal Swab     Status: None   Collection Time: 10/30/19 10:47 PM   Specimen: Nasopharyngeal Swab  Result Value Ref Range Status   SARS Coronavirus 2 NEGATIVE NEGATIVE Final    Comment: (NOTE) SARS-CoV-2 target nucleic acids are NOT DETECTED. The SARS-CoV-2 RNA is generally detectable in upper and lower respiratory specimens during the acute phase of infection. Negative results do not preclude SARS-CoV-2 infection, do not rule out co-infections with other pathogens, and should not be used as the sole basis for treatment or other patient management decisions. Negative results must be combined with clinical observations, patient history, and epidemiological information. The expected result is Negative. Fact Sheet for Patients: SugarRoll.be Fact Sheet for Healthcare Providers: https://www.woods-mathews.com/ This test is not yet approved or cleared by the [place] FDA and  has been authorized for detection and/or diagnosis of SARS-CoV-2 by FDA under an Emergency Use Authorization (EUA). This EUA will remain  in effect (meaning this test  can be used) for the duration  of the COVID-19 declaration under Section 56 4(b)(1) of the Act, 21 U.S.C. section 360bbb-3(b)(1), unless the authorization is terminated or revoked sooner. Performed at [facility], [place] [address] [place], [place] [address]   SARS Coronavirus 2 by RT PCR (hospital order, performed in [facility] hospital lab) Nasopharyngeal Nasopharyngeal Swab     Status: None   Collection Time: 11/03/19  4:41 PM   Specimen: Nasopharyngeal Swab  Result Value Ref Range Status   SARS Coronavirus 2 NEGATIVE NEGATIVE Final    Comment: (NOTE) SARS-CoV-2 target nucleic acids are NOT DETECTED. The SARS-CoV-2 RNA is generally detectable in upper and lower respiratory specimens during the acute phase of infection. The lowest concentration of SARS-CoV-2 viral copies this assay can detect is 250 copies / mL. A negative result does not preclude SARS-CoV-2 infection and should not be used as the sole basis for treatment or other patient management decisions.  A negative result may occur with improper specimen collection / handling, submission of specimen other than nasopharyngeal swab, presence of viral mutation(s) within the areas targeted by this assay, and inadequate number of viral copies (<250 copies / mL). A negative result must be combined with clinical observations, patient history, and epidemiological information. Fact Sheet for Patients:   StrictlyIdeas.no Fact Sheet for Healthcare Providers: BankingDealers.co.za This test is not yet approved or cleared  by the [place] FDA and has been authorized for detection and/or diagnosis of SARS-CoV-2 by FDA under an Emergency Use Authorization (EUA).  This EUA will remain in effect (meaning this test can be used) for the duration of the COVID-19 declaration under Section 564(b)(1) of the Act, 21 U.S.C. section 360bbb-3(b)(1), unless the authorization is terminated or revoked sooner. Performed at  [facility], [address], [place], [place] [address]          Radiology Studies: No results found.      Scheduled Meds: . aspirin EC  325 mg Oral Daily  . busPIRone  5 mg Oral BID  . Chlorhexidine Gluconate Cloth  6 each Topical Q0600  . enoxaparin (LOVENOX) injection  40 mg Subcutaneous Q24H  . insulin aspart  0-6 Units Subcutaneous TID WC  . insulin aspart  3 Units Subcutaneous TID WC  . insulin detemir  10 Units Subcutaneous QHS  . methenamine  1,000 mg Oral BID  . midazolam  4 mg Intramuscular Once  . pantoprazole  40 mg Oral Daily  . potassium chloride  40 mEq Oral Once  . sodium bicarbonate  650 mg Oral Daily  . sucralfate  1 g Oral TID AC  . vitamin C  250 mg Oral Daily   Continuous Infusions: . sodium chloride Stopped (11/03/19 0925)  . sodium chloride 75 mL/hr at 11/04/19 0508  . dextrose 5 % and 0.45% NaCl 50 mL/hr at 11/03/19 1637  . insulin 1.4 Units/hr (11/03/19 2258)    Assessment & Plan:   Principal Problem:   DKA (diabetic ketoacidoses) ([place]) Active Problems:   Type 1 diabetes mellitus ([place])   Hypotension   Anemia   Paraplegia ([place])   AKI (acute kidney injury) ([place])  DKA Presents with pH 7.19, glucose 680, anion gap 25. Possibly not receiving enough insulin at rehab. -Continue on IV fluids -On DKA protocol was started on admission Beta hydroxybutyrate acid decreasing anion gap closed, normalized -beta hydroxybutyrate trending down We will add bicarb tablets Started on subcu insulin Levemir and NovoLog Monitor blood glucose closely  Hypotension history of "autonomic dysfunction" treated with Florinef.  During his last hospitalization he presented in a similar manner, notes suggest he was improved with fluids, but also placed on midodrine briefly at some point during that hospitalization.  Despite his BP < 90 mmHg systolic, there are no overt signs of infection, temp and WBC normal.  Certainly could be due to dehydration  too -Solu-Cortef 50 MG Q6H for 24 hours- -> will discontinue. -bp stable now.  Acute metabolic encephalopathy Similar to previous presentation, was very altered when in DKA and with hypotension. -Resolved with correction of his DKA   Recent stroke No previous stroke until last hospitalization, patient was admitted altered, had an unresponsive episode.  Follow-up EEG showed no epileptic activity.   Hypercoagulable work-up negative except for mild hyperhomocysteinemia nonspecific. Follow-up MRI showed multifocal strokes, suspect to be embolic.   TEE negative, loop recorder implanted. -Continue aspirin -none during this hospitalization  Hyponatremia  Likely secondary to DKA/hyperglycemia and dehydration Resolved with IV fluids Monitor labs   acute kidney injury Baseline creatinine 0.6.   Likely due to prerenal azotemia Resolved with IV fluids continue to monitor   Elevated total bilirubin Suspect this is stress related -Trend and fractionate bilirubin in the morning   Check a.m. labsHypertension -Hold metoprolol and hydralazine for now  Paraplegia Neurogenic bladder with recurrent UTI Was treated with ceftriaxone for UTI during last hospitalization.  Current urinalysis normal. -Continue baclofen -Continue oxycodone -Continue methenamine  GERD -Continue PPI, Carafate  Anxiety -Continue BuSpar  Macrocytic anemia Hemoglobin decreased from baseline.  Elevated homocysteine during coag work up last admission -> -B12 elevated levels TSH nml Folate level pending       DVT prophylaxis: Lovenox  Code Status: DNR  Family Communication:   Disposition Plan:  likely d/c in am if stable.  To mother about assessment and plans all questions and concerns were addressed to their satisfaction   LOS: 1 day   Time spent: 45 minutes with more than 50% COC    Nolberto [name], MD [facility] Pager [phone]  If 7PM-7AM, please contact  night-coverage [email or web address] Password [facility] 11/04/2019, 12:47 PM

## 2019-11-04 NOTE — Progress Notes (Signed)
Baclofen administered. Patient requesting 3rd dose at 2300. Shared concerns with patient about close administration and need to involve provider to decide when another dose can be administered. Attempted recheck of BP on right leg. No change from prior reading.

## 2019-11-04 NOTE — Progress Notes (Signed)
Notified [facility] NP regarding patients request for additional dose at 2300 of baclofen, need for bedtime insulin coverage and elevated BP. She does not want another dose of baclofen tonight. She will order one time dose of valium to give. Ordered bedtime coverage. Will continue to monitor BP to see if it trends down once muscle spasms decrease.

## 2019-11-04 NOTE — Progress Notes (Addendum)
Patient bp elevated. Patient having full body muscle spasms and can not remain still to get accurate bp. Patient  Reports that he is off schedule for his baclofen. Notified pharmacy for prn dose. Will give asap when it arrives. Patient is alert, responsive and in no distress. Patient also recently medicated for pain. Patient bp will be rechecked following baclofen administration.

## 2019-11-05 ENCOUNTER — Inpatient Hospital Stay: Payer: Medicare Other

## 2019-11-05 LAB — GLUCOSE, CAPILLARY
Glucose-Capillary: 177 mg/dL — ABNORMAL HIGH (ref 70–99)
Glucose-Capillary: 238 mg/dL — ABNORMAL HIGH (ref 70–99)
Glucose-Capillary: 362 mg/dL — ABNORMAL HIGH (ref 70–99)
Glucose-Capillary: 326 mg/dL — ABNORMAL HIGH (ref 70–99)
Glucose-Capillary: 349 mg/dL — ABNORMAL HIGH (ref 70–99)
Glucose-Capillary: 437 mg/dL — ABNORMAL HIGH (ref 70–99)

## 2019-11-05 LAB — CBC WITH DIFFERENTIAL/PLATELET
Abs Immature Granulocytes: 0.07 10*3/uL (ref 0.00–0.07)
Basophils Absolute: 0 10*3/uL (ref 0.0–0.1)
Basophils Relative: 0 %
Eosinophils Absolute: 0 10*3/uL (ref 0.0–0.5)
Eosinophils Relative: 0 %
HCT: 26.9 % — ABNORMAL LOW (ref 39.0–52.0)
Hemoglobin: 9.7 g/dL — ABNORMAL LOW (ref 13.0–17.0)
Immature Granulocytes: 1 %
Lymphocytes Relative: 10 %
Lymphs Abs: 1.2 10*3/uL (ref 0.7–4.0)
MCH: 32.6 pg (ref 26.0–34.0)
MCHC: 36.1 g/dL — ABNORMAL HIGH (ref 30.0–36.0)
MCV: 90.3 fL (ref 80.0–100.0)
Monocytes Absolute: 0.8 10*3/uL (ref 0.1–1.0)
Monocytes Relative: 7 %
Neutro Abs: 9.5 10*3/uL — ABNORMAL HIGH (ref 1.7–7.7)
Neutrophils Relative %: 82 %
Platelets: 313 10*3/uL (ref 150–400)
RBC: 2.98 MIL/uL — ABNORMAL LOW (ref 4.22–5.81)
RDW: 12.5 % (ref 11.5–15.5)
WBC: 11.6 10*3/uL — ABNORMAL HIGH (ref 4.0–10.5)
nRBC: 0 % (ref 0.0–0.2)

## 2019-11-05 LAB — COMPREHENSIVE METABOLIC PANEL
ALT: 15 U/L (ref 0–44)
AST: 25 U/L (ref 15–41)
Albumin: 3.1 g/dL — ABNORMAL LOW (ref 3.5–5.0)
Alkaline Phosphatase: 81 U/L (ref 38–126)
Anion gap: 10 (ref 5–15)
BUN: 20 mg/dL (ref 6–20)
CO2: 22 mmol/L (ref 22–32)
Calcium: 8.3 mg/dL — ABNORMAL LOW (ref 8.9–10.3)
Chloride: 95 mmol/L — ABNORMAL LOW (ref 98–111)
Creatinine, Ser: 0.78 mg/dL (ref 0.61–1.24)
GFR calc Af Amer: >60 mL/min (ref >60)
GFR calc non Af Amer: >60 mL/min (ref >60)
Glucose, Bld: 207 mg/dL — ABNORMAL HIGH (ref 70–99)
Potassium: 4.1 mmol/L (ref 3.5–5.1)
Sodium: 127 mmol/L — ABNORMAL LOW (ref 135–145)
Total Bilirubin: 0.8 mg/dL (ref 0.3–1.2)
Total Protein: 5.6 g/dL — ABNORMAL LOW (ref 6.5–8.1)

## 2019-11-05 LAB — FOLATE RBC
Folate, Hemolysate: 400 ng/mL
Folate, RBC: 1365 ng/mL (ref >498)
Hematocrit: 29.3 % — ABNORMAL LOW (ref 37.5–51.0)

## 2019-11-05 LAB — BETA-HYDROXYBUTYRIC ACID: Beta-Hydroxybutyric Acid: 0.94 mmol/L — ABNORMAL HIGH (ref 0.05–0.27)

## 2019-11-05 MED ORDER — LORAZEPAM 2 MG/ML IJ SOLN
2.0000 mg | Freq: Once | INTRAMUSCULAR | Status: AC
  Administered 2019-11-05: 2 mg via INTRAVENOUS
  Filled 2019-11-05: qty 1

## 2019-11-05 MED ORDER — LABETALOL HCL 5 MG/ML IV SOLN
10.0000 mg | INTRAVENOUS | Status: DC | PRN
  Administered 2019-11-05 – 2019-11-09 (×6): 10 mg via INTRAVENOUS
  Filled 2019-11-05 (×6): qty 4

## 2019-11-05 MED ORDER — HYDRALAZINE HCL 25 MG PO TABS: 25.0000 mg | ORAL_TABLET | Freq: Three times a day (TID) | ORAL | Status: DC

## 2019-11-05 MED ORDER — INSULIN DETEMIR 100 UNIT/ML ~~LOC~~ SOLN
15.0000 [IU] | Freq: Every day | SUBCUTANEOUS | Status: DC
  Administered 2019-11-05: 15 [IU] via SUBCUTANEOUS
  Filled 2019-11-05: qty 0.15

## 2019-11-05 NOTE — Progress Notes (Addendum)
[facility] NP and this RN at bedside. Patient noted to have bowel movement. Blood observe in stool. Labs ordered.   Tech called the bedside to assist with changing. During changing patient had large soft bowel movement that was brown. No blood noted. External hemorrhoid noted to be red, irritated and bleeding. Patient acknowledges known hemorrhoids and states he normally uses preparation H suppositories to manage hemorrhoids.   [facility] NP notified of external hemorrhoid and bleeding.

## 2019-11-05 NOTE — Progress Notes (Signed)
Telemetry called to report leads were off again. This RN went in room to find that patient had pulled off leads, shoulder immobilizer and prevalon boots. Patient asked why he removed these items, patient replied "what did you want me to do." This is new behavior for this patient. Notified [facility] NP of this behavior.

## 2019-11-05 NOTE — Progress Notes (Signed)
Called by [facility] stating patient wanted to know what he should do about his immobilizer. She reported he had removed it. I communicated to her that it should not be removed. Immobilizer placed back on patient.

## 2019-11-05 NOTE — Progress Notes (Signed)
[place] related to second dose of metoprolol not scheduled for tonight. Pharmacy states it is due to order starting late. Notified pharmacy that patient was having issues with heart rate and needed it. Pharmacy states he will review chart and order.   2330-medication order placed and medication administered.

## 2019-11-05 NOTE — Progress Notes (Signed)
Pt CBG is 437 at HS; on call provider notified, orders received to give highest amount called for on pt's [place]. [place] calls for 5 units Novolog for CBG up to 400; on call provider states to give this amount and recheck CBG. 5 units novolog administered, will recheck CBG in approximately 1 hour.

## 2019-11-05 NOTE — Progress Notes (Signed)
Patient ID: [name], male   DOB: 1971/07/29, 48 y.o.   MRN: [id number]  PROGRESS NOTE    [name] Wake  BJY:[id number] DOB: [birth year] DOA: 11/03/2019 PCP: Einar Pheasant, MD    Brief Narrative:  [name] is a 48 y.o. M with hx paraplegia from [facility] in 1988, type 1 diabetes, and recent left shoulder surgery followed by admission for DKA and cryptogenic embolic stroke who turns to the hospital with confusion, hyperglycemia. Per report, the patient was just [facility] ago.  Since being there, the patient had been complaining to staff that they were not giving him enough insulin.  Then on the day of admission he had reported to staff that he was felt confused, glucose was too high to read and blood pressure was low, so EMS was called.  He has had no fever.  He denies chest discomfort, cough, abdominal pain.   Denies urinary symptoms. In the ER, blood pressure 84/41, he was confused. Heart rate normal, pulse ox normal.  Bicarb 8, glucose 680, anion gap elevated.  VBG showed pH 7.19.  Creatinine 1.55 from baseline 0.6.  Transaminases normal, total bilirubin 2.3.  WBC 11 point 2K, hemoglobin 10.6, down from 12, macrocytic.  Urinalysis without pyuria or hematuria or bacteria.  He was given a large bolus of IV fluids and started on insulin drip and the hospitalist service were asked to evaluate for admission. Mother reports that usually patient is good with managing his own diabetes and giving himself insulin but at the rehab they refused to let him do this.    Consultants:   none  Procedures: None  Antimicrobials:   None   Subjective: T-max 100.5. Pt denies sob, chills. States is abdominal spasm is not under control, more spastic than before. Mother at bedside.  Objective: Vitals:   11/05/19 0050 11/05/19 0351 11/05/19 0611 11/05/19 0716  BP: (!) 218/121 102/68 120/86 139/71  Pulse: (!) 133 80 75 78  Resp:  20  18  Temp:  (!) 97.5 F  (36.4 C)  97.8 F (36.6 C)  TempSrc:  Oral  Oral  SpO2:  97%  100%  Weight:  81.9 kg    Height:        Intake/Output Summary (Last 24 hours) at 11/05/2019 1146 Last data filed at 11/05/2019 1126 Gross per 24 hour  Intake 2189.07 ml  Output 2450 ml  Net -260.93 ml   Filed Weights   11/03/19 1958 11/05/19 0351  Weight: 76 kg 81.9 kg    Examination:  General exam: Appears calm and comfortable , NAD Respiratory system: Cta, no wheeze rales rhonchi's Cardiovascular system:RRR, S1 & S2 heard No JVD, murmurs, rubs, gallops or clicks.  Gastrointestinal system: Abdomen is nondistended, soft and nontender.  +bs Central nervous system: Alert oriented x3  GU: condom cath in place Extremities: no edema Skin: warm, dry      Data Reviewed: I have personally reviewed following labs and imaging studies  CBC: Recent Labs  Lab 10/30/19 0428 11/03/19 0159 11/05/19 0243  WBC 6.8 11.2* 11.6*  NEUTROABS 4.9 10.2* 9.5*  HGB 12.9* 10.6* 9.7*  HCT 36.4* 32.7* 26.9*  MCV 90.3 101.2* 90.3  PLT 275 322 086   Basic Metabolic Panel: Recent Labs  Lab 10/30/19 0428  11/03/19 2221 11/04/19 0218 11/04/19 0524 11/04/19 0926 11/05/19 0243  NA 131*   < > 131* 130* 132* 135 127*  K 3.8   < > 3.9 3.5 3.3* 3.3*  4.1  CL 90*   < > 100 97* 100 101 95*  CO2 30   < > 17* 21* 20* 21* 22  GLUCOSE 170*   < > 162* 127* 132* 91 207*  BUN 10   < > 29* 27* 26* 24* 20  CREATININE 0.58*   < > 0.87 0.80 0.77 0.68 0.78  CALCIUM 8.9   < > 8.5* 8.7* 8.7* 8.9 8.3*  MG 1.8  --   --   --   --   --   --    < > = values in this interval not displayed.   GFR: Estimated Creatinine Clearance: 122.6 mL/min (by C-G formula based on SCr of 0.78 mg/dL). Liver Function Tests: Recent Labs  Lab 10/30/19 0428 11/03/19 0159 11/03/19 0423 11/05/19 0243  AST 27 28  --  25  ALT 19 16  --  15  ALKPHOS 91 84  --  81  BILITOT 0.8 2.3* 2.0* 0.8  PROT 6.5 6.1*  --  5.6*  ALBUMIN 3.5 3.4*  --  3.1*   No results for  input(s): LIPASE, AMYLASE in the last 168 hours. No results for input(s): AMMONIA in the last 168 hours. Coagulation Profile: Recent Labs  Lab 10/30/19 0428  INR 1.0   Cardiac Enzymes: No results for input(s): CKTOTAL, CKMB, CKMBINDEX, TROPONINI in the last 168 hours. BNP (last 3 results) No results for input(s): PROBNP in the last 8760 hours. HbA1C: No results for input(s): HGBA1C in the last 72 hours. CBG: Recent Labs  Lab 11/04/19 1739 11/04/19 2051 11/05/19 0203 11/05/19 0714 11/05/19 1127  GLUCAP 153* 302* 177* 238* 362*   Lipid Profile: No results for input(s): CHOL, HDL, LDLCALC, TRIG, CHOLHDL, LDLDIRECT in the last 72 hours. Thyroid Function Tests: Recent Labs    11/03/19 0544  TSH 1.975   Anemia Panel: Recent Labs    11/03/19 0544  VITAMINB12 4,972*  RETICCTPCT 2.7   Sepsis Labs: Recent Labs  Lab 11/03/19 0423 11/03/19 0959  LATICACIDVEN 1.6 1.6    Recent Results (from the past 240 hour(s))  MRSA PCR Screening     Status: None   Collection Time: 10/26/19  2:28 PM   Specimen: Nasal Mucosa; Nasopharyngeal  Result Value Ref Range Status   MRSA by PCR NEGATIVE NEGATIVE Final    Comment:        The GeneXpert MRSA Assay (FDA approved for NASAL specimens only), is one component of a comprehensive MRSA colonization surveillance program. It is not intended to diagnose MRSA infection nor to guide or monitor treatment for MRSA infections. Performed at [facility], [place], [place] [address]   SARS CORONAVIRUS 2 (TAT 6-24 HRS) Nasopharyngeal Nasopharyngeal Swab     Status: None   Collection Time: 10/30/19 10:47 PM   Specimen: Nasopharyngeal Swab  Result Value Ref Range Status   SARS Coronavirus 2 NEGATIVE NEGATIVE Final    Comment: (NOTE) SARS-CoV-2 target nucleic acids are NOT DETECTED. The SARS-CoV-2 RNA is generally detectable in upper and lower respiratory specimens during the acute phase of infection.  Negative results do not preclude SARS-CoV-2 infection, do not rule out co-infections with other pathogens, and should not be used as the sole basis for treatment or other patient management decisions. Negative results must be combined with clinical observations, patient history, and epidemiological information. The expected result is Negative. Fact Sheet for Patients: SugarRoll.be Fact Sheet for Healthcare Providers: https://www.woods-mathews.com/ This test is not yet approved or cleared by the [place]  States FDA and  has been authorized for detection and/or diagnosis of SARS-CoV-2 by FDA under an Emergency Use Authorization (EUA). This EUA will remain  in effect (meaning this test can be used) for the duration of the COVID-19 declaration under Section 56 4(b)(1) of the Act, 21 U.S.C. section 360bbb-3(b)(1), unless the authorization is terminated or revoked sooner. Performed at [facility], [place] [address], [place], [place] [address]   SARS Coronavirus 2 by RT PCR (hospital order, performed in [facility] hospital lab) Nasopharyngeal Nasopharyngeal Swab     Status: None   Collection Time: 11/03/19  4:41 PM   Specimen: Nasopharyngeal Swab  Result Value Ref Range Status   SARS Coronavirus 2 NEGATIVE NEGATIVE Final    Comment: (NOTE) SARS-CoV-2 target nucleic acids are NOT DETECTED. The SARS-CoV-2 RNA is generally detectable in upper and lower respiratory specimens during the acute phase of infection. The lowest concentration of SARS-CoV-2 viral copies this assay can detect is 250 copies / mL. A negative result does not preclude SARS-CoV-2 infection and should not be used as the sole basis for treatment or other patient management decisions.  A negative result may occur with improper specimen collection / handling, submission of specimen other than nasopharyngeal swab, presence of viral mutation(s) within the areas targeted by this assay,  and inadequate number of viral copies (<250 copies / mL). A negative result must be combined with clinical observations, patient history, and epidemiological information. Fact Sheet for Patients:   StrictlyIdeas.no Fact Sheet for Healthcare Providers: BankingDealers.co.za This test is not yet approved or cleared  by the [place] FDA and has been authorized for detection and/or diagnosis of SARS-CoV-2 by FDA under an Emergency Use Authorization (EUA).  This EUA will remain in effect (meaning this test can be used) for the duration of the COVID-19 declaration under Section 564(b)(1) of the Act, 21 U.S.C. section 360bbb-3(b)(1), unless the authorization is terminated or revoked sooner. Performed at [facility], [address] [place], [place] [address]          Radiology Studies: No results found.      Scheduled Meds: . aspirin EC  325 mg Oral Daily  . busPIRone  5 mg Oral BID  . enoxaparin (LOVENOX) injection  40 mg Subcutaneous Q24H  . insulin aspart  0-5 Units Subcutaneous QHS  . insulin aspart  0-6 Units Subcutaneous TID WC  . insulin aspart  3 Units Subcutaneous TID WC  . insulin detemir  10 Units Subcutaneous QHS  . methenamine  1,000 mg Oral BID  . metoprolol tartrate  25 mg Oral BID  . pantoprazole  40 mg Oral Daily  . sodium bicarbonate  650 mg Oral Daily  . sucralfate  1 g Oral TID AC  . vitamin C  250 mg Oral Daily   Continuous Infusions:   Assessment & Plan:   Principal Problem:   DKA (diabetic ketoacidoses) ([place]) Active Problems:   Type 1 diabetes mellitus ([place])   Hypotension   Anemia   Paraplegia (HCC)   AKI (acute kidney injury) ([place])  DKA Presents with pH 7.19, glucose 680, anion gap 25. Possibly not receiving enough insulin at rehab. -Was started on DKA protocol was started on admission now transition to subcu insulin Beta hydroxybutyrate acid decreasing anion gap closed,  normalized -beta hydroxybutyrate resolving Continue  bicarb tablets Continue subcu insulin Levemir and NovoLog Monitor blood glucose closely DC IV fluids  Low-grade fever No pulmonary symptoms We will check urine culture, blood  cultures Check abdominal x-ray as he is complaining of increased spasm from his baseline   hypotension history of "autonomic dysfunction" treated with Florinef.  During his last hospitalization he presented in a similar manner, notes suggest he was improved with fluids, but also placed on midodrine briefly at some point during that hospitalization.  Despite his BP < 90 mmHg systolic, there are no overt signs of infection, temp and WBC normal.  Certainly could be due to dehydration too -Solu-Cortef 50 MG Q6H for 24 hours- -> continue -bp stable now.  Acute metabolic encephalopathy Similar to previous presentation, was very altered when in DKA and with hypotension. -Resolved with correction of his DKA -Mentation at baseline   Recent stroke No previous stroke until last hospitalization, patient was admitted altered, had an unresponsive episode.  Follow-up EEG showed no epileptic activity.   Hypercoagulable work-up negative except for mild hyperhomocysteinemia nonspecific. Follow-up MRI showed multifocal strokes, suspect to be embolic.   TEE negative, loop recorder implanted. -Continue aspirin -none during this hospitalization  Hyponatremia  Likely secondary to DKA/hyperglycemia and dehydration Resolved with IV fluids Mildly lower today but has increased his p.o. intake if remains on the low side will restart his IV fluids    acute kidney injury Baseline creatinine 0.6.   Likely due to prerenal azotemia Resolved with IV fluids continue to monitor   Elevated total bilirubin Suspect this is stress related -Trend and fractionate bilirubin in the morning   Check a.m. labsHypertension -Hold metoprolol and hydralazine for  now  Paraplegia Neurogenic bladder with recurrent UTI Was treated with ceftriaxone for UTI during last hospitalization.  Current urinalysis normal. -Continue baclofen -Continue oxycodone -Continue methenamine  GERD -Continue PPI, Carafate  Anxiety -Continue BuSpar  Macrocytic anemia Hemoglobin decreased from baseline.  Elevated homocysteine during coag work up last admission -> -B12 elevated levels TSH nml Folate level pending       DVT prophylaxis: Lovenox  Code Status: DNR  Family Communication:   Disposition Plan:  Possible DC in 1 to 2 days if he remains clinically stable and afebrile .discuss assessment and plans with the mother who was present in the room.      LOS: 2 days   Time spent: 45 minutes with more than 50% COC    Nolberto [name], MD [facility] Pager [phone]  If 7PM-7AM, please contact night-coverage [email or web address] Password [facility] 11/05/2019, 11:46 AM Patient ID: [name], male   DOB: [birth year], 48 y.o.   MRN: [id number]

## 2019-11-05 NOTE — Progress Notes (Addendum)
    BRIEF OVERNIGHT PROGRESS REPORT   SUBJECTIVE: Patient c/o of ongoing abdominal spasms for which he takes Baclofen TID, he was also noted with elevated BP >230s and HR>130. Reports no other associated symptoms.  OBJECTIVE: Patient examined at the bedside. He was afebrile with blood pressure 222/128 mm Hg and pulse rate 135 beats/min. There were no focal neurological deficits; he was alert and oriented x4, and c/o abdominal spasm. Loose bloody stools noted and on further evaluation, he has external hemorrhoids which appears red, irritated and bleeding. Patient acknowledges known hemorrhoids and states he normally uses preparation H suppositories to manage hemorrhoids.   ASSESSMENT: 48 y.o.Mwith hx paraplegiafrom [facility] in 1988, type 1 diabetes, and recent left shoulder surgery followed by admission for DKA and cryptogenic embolic strokewho turns to the hospital with confusion, hyperglycemia.  PLAN: 1. Hypertension - Bp remains elevated with sbp>200, initially with hypotension on presentation requiring Florinef. - Continue Metoprolol as ordered - Labetalol PRN administered with improvement in BP and HR  2. Rectal bleeding - Likely Lower GI from bleeding hemorrhoids noted on exam. Has known hx of Hemorrhoids - Repeat CBC shows slight drop in hgb - Continue to H&H monitoring q6h may consider GI  Consult if appropriate  3. Hyponatremia - Repeat CMP shows Na+ of 127 - Continue IVFs and serial sodium checks, treat hyperglycemia  4. Abdominal muscle spasm - Hx of paraplegia with neurogenic bladder, he reports severe intermittent abdominal muscle spasm for the past 1 year  - Reports resolution this am with Lorazepam, however was noted with transient confusion would give lower dose 1 mg or valium po - Continue Home Baclofen - Continue oxycodone - Continue methenamine  5. Recent Embolic stroke - No new strokes or stroke like symptoms - previous ESUS work up unremarkable - Placed on loop  recorder - Continue Aspirin for now   Rufina Falco, DNP, [place], FNP-C [facility] Nurse Practitioner Between 7pm to [place] - Pager [phone]  After 7am go to [email or web address] - password:[email or web address] select [facility]  [facility] [employer]  [phone]

## 2019-11-05 NOTE — Progress Notes (Signed)
Notified O[place] NP that patient continued to have elevated BP and HR despite administering metoprolol. She will come to bedside to eval.

## 2019-11-06 LAB — BASIC METABOLIC PANEL
Anion gap: 12 (ref 5–15)
BUN: 12 mg/dL (ref 6–20)
CO2: 24 mmol/L (ref 22–32)
Calcium: 8.2 mg/dL — ABNORMAL LOW (ref 8.9–10.3)
Chloride: 95 mmol/L — ABNORMAL LOW (ref 98–111)
Creatinine, Ser: 0.51 mg/dL — ABNORMAL LOW (ref 0.61–1.24)
GFR calc Af Amer: >60 mL/min (ref >60)
GFR calc non Af Amer: >60 mL/min (ref >60)
Glucose, Bld: 202 mg/dL — ABNORMAL HIGH (ref 70–99)
Potassium: 3.3 mmol/L — ABNORMAL LOW (ref 3.5–5.1)
Sodium: 131 mmol/L — ABNORMAL LOW (ref 135–145)

## 2019-11-06 LAB — GLUCOSE, CAPILLARY
Glucose-Capillary: 301 mg/dL — ABNORMAL HIGH (ref 70–99)
Glucose-Capillary: 260 mg/dL — ABNORMAL HIGH (ref 70–99)
Glucose-Capillary: 176 mg/dL — ABNORMAL HIGH (ref 70–99)
Glucose-Capillary: 127 mg/dL — ABNORMAL HIGH (ref 70–99)
Glucose-Capillary: 65 mg/dL — ABNORMAL LOW (ref 70–99)
Glucose-Capillary: 313 mg/dL — ABNORMAL HIGH (ref 70–99)

## 2019-11-06 MED ORDER — CEFAZOLIN SODIUM-DEXTROSE 2-4 GM/100ML-% IV SOLN
2.0000 g | Freq: Three times a day (TID) | INTRAVENOUS | Status: DC
  Administered 2019-11-06 – 2019-11-07 (×4): 2 g via INTRAVENOUS
  Filled 2019-11-06 (×6): qty 100

## 2019-11-06 MED ORDER — CEFAZOLIN SODIUM 1 G IJ SOLR: 2.0000 g | Freq: Three times a day (TID) | INTRAMUSCULAR | Status: DC

## 2019-11-06 MED ORDER — INSULIN DETEMIR 100 UNIT/ML ~~LOC~~ SOLN
20.0000 [IU] | Freq: Every day | SUBCUTANEOUS | Status: DC
  Administered 2019-11-06: 20 [IU] via SUBCUTANEOUS
  Filled 2019-11-06: qty 0.2

## 2019-11-06 MED ORDER — POTASSIUM CHLORIDE CRYS ER 20 MEQ PO TBCR
40.0000 meq | EXTENDED_RELEASE_TABLET | Freq: Once | ORAL | Status: AC
  Administered 2019-11-06: 40 meq via ORAL
  Filled 2019-11-06: qty 2

## 2019-11-06 MED ORDER — CEFAZOLIN SODIUM-DEXTROSE 2-4 GM/100ML-% IV SOLN
2.0000 g | Freq: Three times a day (TID) | INTRAVENOUS | Status: DC
  Administered 2019-11-06: 2 g via INTRAVENOUS
  Filled 2019-11-06 (×3): qty 100

## 2019-11-06 MED ORDER — VITAMIN C 500 MG PO TABS
1000.0000 mg | ORAL_TABLET | Freq: Two times a day (BID) | ORAL | Status: DC
  Administered 2019-11-06 – 2019-11-09 (×6): 1000 mg via ORAL
  Filled 2019-11-06 (×6): qty 2

## 2019-11-06 MED ORDER — POLYETHYLENE GLYCOL 3350 17 G PO PACK
17.0000 g | PACK | Freq: Every day | ORAL | Status: DC
  Administered 2019-11-06 – 2019-11-08 (×2): 17 g via ORAL
  Filled 2019-11-06 (×3): qty 1

## 2019-11-06 MED ORDER — PHENYLEPH-SHARK LIV OIL-MO-PET 0.25-3-14-71.9 % RE OINT
TOPICAL_OINTMENT | Freq: Two times a day (BID) | RECTAL | Status: DC | PRN
  Administered 2019-11-06: 06:00:00 via RECTAL
  Filled 2019-11-06 (×2): qty 57

## 2019-11-06 NOTE — Progress Notes (Signed)
Pharmacy Antibiotic Note  [name] is a 48 y.o. male admitted on 11/03/2019 with bacteremia.  Pharmacy has been consulted for Cefazolin dosing.  Plan: Cefazolin 2gm IV q8hrs  Height: 6' (182.9 cm) Weight: 180 lb 8 oz (81.9 kg) IBW/kg (Calculated) : 77.6  Temp (24hrs), Avg:97.7 F (36.5 C), Min:97.5 F (36.4 C), Max:97.9 F (36.6 C)  Recent Labs  Lab 10/30/19 0428 11/03/19 0159 11/03/19 0423 11/03/19 0959  11/03/19 2221 11/04/19 0218 11/04/19 0524 11/04/19 0926 11/05/19 0243  WBC 6.8 11.2*  --   --   --   --   --   --   --  11.6*  CREATININE 0.58* 1.55* 1.59*  --    < > 0.87 0.80 0.77 0.68 0.78  LATICACIDVEN  --   --  1.6 1.6  --   --   --   --   --   --    < > = values in this interval not displayed.    Estimated Creatinine Clearance: 122.6 mL/min (by C-G formula based on SCr of 0.78 mg/dL).    Allergies  Allergen Reactions  . Decongestant [Pseudoephedrine Hcl] Other (See Comments)    Cause UTIs  . Ivp Dye [Iodinated Diagnostic Agents] Hives and Other (See Comments)    Urticaria   . Sulfa Antibiotics Swelling and Other (See Comments)    Tongue swells    Antimicrobials this admission: Methenamine 11/28 >>  Cefazolin 11/30 >>   Dose adjustments this admission:   Microbiology results:  BCx: GPC in 1 of 4 bottles  UCx:    Sputum:    MRSA PCR:   Thank you for allowing pharmacy to be a part of this patient's care.  Hart Robinsons A 11/06/2019 1:46 AM

## 2019-11-06 NOTE — Care Management Important Message (Signed)
Important Message  Patient Details  Name: [name] MRN: [id number] Date of Birth: 12-14-1970   Medicare Important Message Given:  Yes     Juliann Pulse A Christabella Alvira 11/06/2019, 2:38 PM

## 2019-11-06 NOTE — Progress Notes (Signed)
PHARMACY - PHYSICIAN COMMUNICATION CRITICAL VALUE ALERT - BLOOD CULTURE IDENTIFICATION (BCID)  [name] is an 48 y.o. male who presented to [facility] on 11/03/2019 with a chief complaint of hyperglycemia  Assessment:  Lab reports 1 of 4 bottles + for [facility]  Name of physician (or Provider) Contacted: Jonny Ruiz, NP  Current antibiotics: methenamine po  Changes to prescribed antibiotics recommended: Cefazolin 2gm IV q8hrs Recommendations accepted by provider  No results found for this or any previous visit.  Hart Robinsons A 11/06/2019  1:45 AM

## 2019-11-06 NOTE — Plan of Care (Signed)
  Problem: Clinical Measurements: Goal: Ability to maintain clinical measurements within normal limits will improve Outcome: Progressing Goal: Diagnostic test results will improve Outcome: Progressing   Problem: Activity: Goal: Risk for activity intolerance will decrease Outcome: Progressing   Problem: Elimination: Goal: Will not experience complications related to urinary retention Outcome: Progressing   Problem: Safety: Goal: Ability to remain free from injury will improve Outcome: Progressing

## 2019-11-06 NOTE — TOC Initial Note (Signed)
Transition of Care [facility]) - Initial/Assessment Note    Patient Details  Name: [name] MRN: [id number] Date of Birth: 1971/03/08  Transition of [facility]) CM/SW Contact:    Ross Ludwig, LCSW Phone Number: 11/06/2019, 6:40 PM  Clinical Narrative:                 Patient is a 48 year old male who is alert and oriented x4.  Patient has been at [facility] for short term rehab, Patient still needs SNF placement for short term rehab.  Patient normally lives alone, patient's mother is available to help him sometimes if needed.  Patient's mother requested that CSW speak to her to discuss discharge planning for patient.  Patient's mother inquired if he would be able to go home with home health, CSW informed her it is possible, it would just be a matter of finding which home health agency can take patient.  CSW informed patient's mother that if she wants to try a different SNF that is possible.  Patient's mother would like CSW to send info to facilities in [place].  CSW to continue to follow patient's progress throughout discharge planning.  Expected Discharge Plan: Skilled Nursing Facility Barriers to Discharge: Continued Medical Work up, Ship broker   Patient Goals and CMS Choice Patient states their goals for this hospitalization and ongoing recovery are:: To return to rehab, then return back home. CMS Medicare.gov Compare Post Acute Care list provided to:: Patient Represenative (must comment) Choice offered to / list presented to : [facility] POA / Guardian, Parent  Expected Discharge Plan and Services Expected Discharge Plan: [place] In-house Referral: Clinical Social Work Discharge Planning Services: NA Post Acute Care Choice: [place] Living arrangements for the past 2 months: [place], [place]                                      Prior Living Arrangements/Services Living arrangements  for the past 2 months: [place], [place] Lives with:: Self Patient language and need for interpreter reviewed:: Yes Do you feel safe going back to the place where you live?: No   Patient feels he needs some short term rehab before he is able to return back home.  Need for Family Participation in Patient Care: Yes (Comment) Care giver support system in place?: Yes (comment)      Activities of Daily Living      Permission Sought/Granted Permission sought to share information with : Family Supports, Customer service manager Permission granted to share information with : Yes, Verbal Permission Granted  Share Information with NAME: Bittman,Carolyn M Mother [phone] or Justinn, [place] Father   [phone]  Permission granted to share info w AGENCY: SNF admissions        Emotional Assessment Appearance:: Appears stated age   Affect (typically observed): Agitated Orientation: : Oriented to Self, Oriented to Place, Oriented to  Time, Oriented to Situation Alcohol / Substance Use: Not Applicable Psych Involvement: No (comment)  Admission diagnosis:  Diabetic ketoacidosis without coma associated with type 1 diabetes mellitus ([place]) [E10.10] Patient Active Problem List   Diagnosis Date Noted  . AKI (acute kidney injury) ([place]) 11/03/2019  . Embolic stroke ([place])   . DKA (diabetic ketoacidoses) ([place]) 10/26/2019  . ARF (acute renal failure) ([place]) 10/26/2019  . Decubitus ulcer of back 10/20/2019  . Elevated blood  pressure reading without diagnosis of hypertension 10/20/2019  . Hypomagnesemia 10/20/2019  . Rotator cuff tear 10/19/2019  . Muscle cramps 04/01/2019  . Hyponatremia 12/25/2018  . Healthcare maintenance 05/05/2018  . Depression, recurrent ([place]) 08/09/2017  . Skin ulcer ([place]) 06/05/2016  . Paraplegia ([place]) 06/05/2016  . Trigger finger of right hand 12/22/2015  . Food allergy 09/10/2014  . Anemia 08/20/2013  . Alcohol abuse 08/20/2013  .  Edema 05/21/2013  . Syncope 12/15/2012  . Hypotension 11/27/2012  . Type 1 diabetes mellitus ([place]) 11/24/2012  . History of frequent urinary tract infections 11/24/2012   PCP:  Einar Pheasant, MD Pharmacy:   [place], [place] [address] [address] Phone: [phone] Fax: [phone]     Social Determinants of Health (SDOH) Interventions    Readmission Risk Interventions No flowsheet data found.

## 2019-11-06 NOTE — Progress Notes (Signed)
Inpatient Diabetes Program Recommendations  AACE/ADA: New Consensus Statement on Inpatient Glycemic Control (2015)  Target Ranges:  Prepandial:   less than 140 mg/dL      Peak postprandial:   less than 180 mg/dL (1-2 hours)      Critically ill patients:  140 - 180 mg/dL   Results for KAVON, VALENZA (MRN [id number]) as of 11/06/2019 08:24  Ref. Range 11/05/2019 07:14 11/05/2019 11:27 11/05/2019 12:13 11/05/2019 16:30 11/05/2019 21:58  Glucose-Capillary Latest Ref Range: 70 - 99 mg/dL 238 (H)  5 units NOVOLOG given at 9am 362 (H)  8 units NOVOLOG  326 (H) 349 (H)  7 units NOVOLOG  437 (H)  5 units NOVOLOG +  15 units LEVEMIR   Results for TYLON, KEMMERLING (MRN [id number]) as of 11/06/2019 08:24  Ref. Range 11/06/2019 00:59 11/06/2019 03:07 11/06/2019 07:21  Glucose-Capillary Latest Ref Range: 70 - 99 mg/dL 301 (H) 260 (H) 176 (H)     Home DM Meds: Levemir 29 units QHS                             Humalog 1-6 units TID with meals  Current Orders: Levemir 20 units QHS      Novolog 0-6 units TID AC + HS      Novolog 3 units TID with meals      Solucortef 50 mg Q6 hours    Spoke with patient during previous admission for Rotator cuff surgery on 11/13.  Patient told me he was taking Novolog (or Humalog--whatever the insurance will cover at the time) on the following basis: 3 units Novolog for every 15 grams of Carbohydrates consumed (or in other words, 1 unit for every 5 grams carbohydrates) and Novolog 1 unit for every 50 mg/dl above his target CBG of 120 mg/dl.  NOTE: Patient has Type 1 Diabetes--was just discharged on 11/01/19 and is from [facility]. Per note by Jani Gravel, RN on 10/31/19 patient had reported that [facility] took all his insulin away from him which caused him to go into DKA. Jani Gravel, RN called nursing director at [facility] and was told that patient was checking glucose every 30 mins to 1 hour and giving himself insulin so they  took his insulin away. Patient needs basal, correction, and carb coverage insulin as he has DM1 and makes no insulin. At time of discharge, would recommend discharging patient on insulin regimen of basal, meal coverage, and correction (could use insulin regimen similar to SQ regimen used while inpatient if glucose is well controlled).      MD- Please consider increasing Novolog Meal Coverage to the following:  Novolog 6 units TID with meals  (Please add the following Hold Parameters: Hold if pt eats <50% of meal, Hold if pt NPO)      --Will follow patient during hospitalization--  Wyn Quaker RN, MSN, CDE Diabetes Coordinator Inpatient Glycemic Control Team Team Pager: [phone] (8a-5p)

## 2019-11-06 NOTE — Progress Notes (Signed)
CBG rechecked, pt now 301. Notified on call provider via page, no new orders at this time. Pt has asked me to recheck CBG in 2 hours.

## 2019-11-06 NOTE — Evaluation (Signed)
Occupational Therapy Evaluation Patient Details Name: [name] MRN: [id number] DOB: 03/01/1971 Today's Date: 11/06/2019    History of Present Illness Pt is a 48 y.o. M with hx of paraplegia from [facility] in 1988, type 1 diabetes, recent left shoulder surgery, DKA and cryptogenic embolic stroke who returns to the hospital with confusion, hyperglycemia.  MD assessment includes: DKA, hypotension with h/o autonomic dysfunction, acute metabolic encephalopathy resolving, recent CVA, hyponatremia, hypokalemia, AKI, HTN, Macrocytic anemia, and anxiety.   Clinical Impression   Pt seen for OT evaluation this date. Prior to hospital admission, pt was in SNF for rehab following L shoulder sx.  Pt lives alone in home with ramped entrance, and prior to sx, he was managing his own bowel and bladder program as well as insulin and performing self care at a MOD I level. Pt admitted to acute setting and found to have DKA. Currently pt demonstrates impairments in fxl ADL performance requiring MOD/MAX A for toileting/dressing/bathing tasks d/t limited shoulder use as he is still under surgical precautions/restrictions.  Pt would benefit from skilled OT to address noted impairments and functional limitations (see below for any additional details) and offer adaptation/modifcation as necessary, in order to maximize safety and independence while minimizing falls risk and caregiver burden.  Upon hospital discharge, recommend pt discharge to SNF to finish shoulder rehabilitation.    Follow Up Recommendations  SNF    Equipment Recommendations  Other (comment)(TBD at next venue of care)    Recommendations for Other Services       Precautions / Restrictions Precautions Precautions: Shoulder;Fall Type of Shoulder Precautions: Per chart review from prior recent admission: He is to remain in his shoulder immobilizer at all times, removing it only for bathing purposes.  He may begin gentle passive range of motion  exercises of the left shoulder with physical therapy in another week. (2wks from 11/23). Meanwhile, he can work with occupational therapy on range of motion and strength exercises for his wrist, fingers, and elbow. Shoulder Interventions: Shoulder sling/immobilizer;At all times;Off for dressing/bathing/exercises Precaution Booklet Issued: No Restrictions Weight Bearing Restrictions: Yes LUE Weight Bearing: Non weight bearing Other Position/Activity Restrictions: No L hip ABDuction ROM (per pt request d/t concerns for dislocation)      Mobility Bed Mobility Overal bed mobility: Needs Assistance Bed Mobility: Rolling Rolling: Max assist      General bed mobility comments: Pt not wanting to sit EOB w/o 2p assist d/t fear of falling. Deferred on OT evaluation  Transfers                 General transfer comment: Deferred    Balance  Sitting balance - Comments: deferred on OT Evaluation today                                   ADL either performed or assessed with clinical judgement   ADL Overall ADL's : Needs assistance/impaired Eating/Feeding: Modified independent   Grooming: Set up;Minimal assistance   Upper Body Bathing: Moderate assistance;Maximal assistance   Lower Body Bathing: Maximal assistance Lower Body Bathing Details (indicate cue type and reason): based on clinical observation Upper Body Dressing : Maximal assistance   Lower Body Dressing: Maximal assistance     Toilet Transfer Details (indicate cue type and reason): not attempted on eval, but pt requiring hoyer since L shoudler sx per previous OT evaluation Toileting- Clothing Manipulation and Hygiene: Moderate assistance;Maximal assistance  Vision Patient Visual Report: No change from baseline       Perception     Praxis      Pertinent Vitals/Pain Pain Assessment: No/denies pain     Hand Dominance (L UE is strongest/best ROM, but pt reports using both sides  relatively equally after SCI/before L shoulder sx.)   Extremity/Trunk Assessment Upper Extremity Assessment Upper Extremity Assessment: RUE deficits/detail;LUE deficits/detail RUE Deficits / Details: gross and fine motor impairments of digits 1 and 2 at baseline, gross use of RUE for WC propulsion and self care. Cites occasional pins&needles feeling in R UE. LUE Deficits / Details: Pt with full use of L UE at baseline (after SCI), but limited ROM d/t shoudler precaustions/restrictions following rotator cuff sx. LUE: Unable to fully assess due to immobilization   Lower Extremity Assessment Lower Extremity Assessment: Defer to PT evaluation       Communication Communication Communication: No difficulties   Cognition Arousal/Alertness: Awake/alert Behavior During Therapy: WFL for tasks assessed/performed;Anxious Overall Cognitive Status: Within Functional Limits for tasks assessed                                 General Comments: pt somewhat anxious relative to lowering of bed rails, attempting sup<>sit w/ one person. Would benefit from second person to initiate t/f trials during occupational therapy   General Comments       Exercises Other Exercises Other Exercises: OT supported L shoulder with pillow posteriorly at bed level and engaged pt in AROM for 10 reps elbow flex/ext,  wrist flex/ext x10 reps, &  digit flex/ext with end range squeeze against light resistance x10 reps.   Shoulder Instructions      Home Living Family/patient expects to be discharged to:: Skilled nursing facility Living Arrangements: Alone   Type of Home: House Home Access: Ramped entrance;Elevator     Home Layout: One level     Bathroom Shower/Tub: Tub/shower unit         Home Equipment: Wheelchair - manual;Shower seat   Additional Comments: HOB/FOB tilt; electric scooter attachment for WC; slideboard      Prior Functioning/Environment Level of Independence: Independent with  assistive device(s)  Gait / Transfers Assistance Needed: lateral scoot transfers or slide board transfers [facility] to/from bed/car/shower chair. Wheels himself in manual wheelchair. ADL's / Homemaking Assistance Needed: Pt was performing all ADLs/IADLs at MOD I level secondary to quadriplegic since 1988.            OT Problem List: Decreased strength;Decreased coordination;Decreased range of motion;Impaired sensation;Impaired balance (sitting and/or standing);Impaired UE functional use      OT Treatment/Interventions: Self-care/ADL training;Therapeutic exercise;Therapeutic activities;DME and/or AE instruction;Patient/family education;Balance training;Neuromuscular education    OT Goals(Current goals can be found in the care plan section) Acute Rehab OT Goals Patient Stated Goal: To finish rehab and get stronger OT Goal Formulation: With patient Time For Goal Achievement: 11/20/19 Potential to Achieve Goals: Good  OT Frequency: Min 1X/week   Barriers to D/C: Decreased caregiver support          Co-evaluation              AM-PAC OT "6 Clicks" Daily Activity     Outcome Measure Help from another person eating meals?: None Help from another person taking care of personal grooming?: A Little Help from another person toileting, which includes using toliet, bedpan, or urinal?: A Lot Help from another person bathing (including washing, rinsing, drying)?: A  Lot Help from another person to put on and taking off regular upper body clothing?: A Lot Help from another person to put on and taking off regular lower body clothing?: A Lot 6 Click Score: 15   End of Session    Activity Tolerance: Patient tolerated treatment well Patient left: in bed;with call bell/phone within reach  OT Visit Diagnosis: Other abnormalities of gait and mobility (R26.89);Other symptoms and signs involving the nervous system (R29.898)                Time: 1550-1620 OT Time Calculation (min): 30 min Charges:   OT General Charges $OT Visit: 1 Visit OT Evaluation $OT Eval Moderate Complexity: 1 Mod OT Treatments $Therapeutic Exercise: 8-22 mins  Gerrianne Scale, MS, OTR/L ascom [phone] 11/06/19, 5:08 PM

## 2019-11-06 NOTE — Progress Notes (Signed)
Patient ID: [name], male   DOB: 02-12-1971, 48 y.o.   MRN: [id number]  PROGRESS NOTE    [name]  TKW:[id number] DOB: December 08, 1970 DOA: 11/03/2019 PCP: [name] Pheasant, MD    Brief Narrative:  [name] is a 48 y.o. M with hx paraplegia from [facility] in 1988, type 1 diabetes, and recent left shoulder surgery followed by admission for DKA and cryptogenic embolic stroke who turns to the hospital with confusion, hyperglycemia. Per report, the patient was just discharged to [facility] 2 days ago.  Since being there, the patient had been complaining to staff that they were not giving him enough insulin.  Then on the day of admission he had reported to staff that he was felt confused, glucose was too high to read and blood pressure was low, so EMS was called.  He has had no fever.  He denies chest discomfort, cough, abdominal pain.   Denies urinary symptoms. In the ER, blood pressure 84/41, he was confused. Heart rate normal, pulse ox normal.  Bicarb 8, glucose 680, anion gap elevated.  VBG showed pH 7.19.  Creatinine 1.55 from baseline 0.6.  Transaminases normal, total bilirubin 2.3.  WBC 11 point 2K, hemoglobin 10.6, down from 12, macrocytic.  Urinalysis without pyuria or hematuria or bacteria.  He was given a large bolus of IV fluids and started on insulin drip and the hospitalist service were asked to evaluate for admission. Mother reports that usually patient is good with managing his own diabetes and giving himself insulin but at the rehab they refused to let him do this.    Consultants:   none  Procedures: None  Antimicrobials:   None   Subjective: Afebrile. Denies sob, cp, or any other new sx. Abdominal spasm better . Objective: Vitals:   11/05/19 1506 11/05/19 1820 11/05/19 2023 11/06/19 0719  BP: (!) 167/127 118/60 114/73 (!) 146/102  Pulse: 86 69 87 72  Resp: 18  20 16   Temp: 97.9 F (36.6 C)  97.7 F (36.5 C) 98.1 F (36.7 C)  TempSrc: Oral   Oral Oral  SpO2: 100%  97% 98%  Weight:      Height:        Intake/Output Summary (Last 24 hours) at 11/06/2019 1128 Last data filed at 11/06/2019 0900 Gross per 24 hour  Intake 240 ml  Output 1600 ml  Net -1360 ml   Filed Weights   11/03/19 1958 11/05/19 0351  Weight: 76 kg 81.9 kg    Examination:  General exam: Appears calm and comfortable , NAD Respiratory system: Cta, no r/r/w Cardiovascular system:RRR, S1 & S2 heard No JVD, murmurs, rubs, gallops or clicks.  Gastrointestinal system: Abdomen is nondistended, soft and nontender.  +bs Central nervous system: Alert oriented x3  GU: condom cath in place Extremities: no edema Skin: warm, dry      Data Reviewed: I have personally reviewed following labs and imaging studies  CBC: Recent Labs  Lab 11/03/19 0159 11/03/19 0544 11/05/19 0243  WBC 11.2*  --  11.6*  NEUTROABS 10.2*  --  9.5*  HGB 10.6*  --  9.7*  HCT 32.7* 29.3* 26.9*  MCV 101.2*  --  90.3  PLT 322  --  924   Basic Metabolic Panel: Recent Labs  Lab 11/04/19 0218 11/04/19 0524 11/04/19 0926 11/05/19 0243 11/06/19 0513  NA 130* 132* 135 127* 131*  K 3.5 3.3* 3.3* 4.1 3.3*  CL 97* 100 101 95* 95*  CO2 21* 20* 21*  22 24  GLUCOSE 127* 132* 91 207* 202*  BUN 27* 26* 24* 20 12  CREATININE 0.80 0.77 0.68 0.78 0.51*  CALCIUM 8.7* 8.7* 8.9 8.3* 8.2*   GFR: Estimated Creatinine Clearance: 122.6 mL/min (A) (by C-G formula based on SCr of 0.51 mg/dL (L)). Liver Function Tests: Recent Labs  Lab 11/03/19 0159 11/03/19 0423 11/05/19 0243  AST 28  --  25  ALT 16  --  15  ALKPHOS 84  --  81  BILITOT 2.3* 2.0* 0.8  PROT 6.1*  --  5.6*  ALBUMIN 3.4*  --  3.1*   No results for input(s): LIPASE, AMYLASE in the last 168 hours. No results for input(s): AMMONIA in the last 168 hours. Coagulation Profile: No results for input(s): INR, PROTIME in the last 168 hours. Cardiac Enzymes: No results for input(s): CKTOTAL, CKMB, CKMBINDEX, TROPONINI in the  last 168 hours. BNP (last 3 results) No results for input(s): PROBNP in the last 8760 hours. HbA1C: No results for input(s): HGBA1C in the last 72 hours. CBG: Recent Labs  Lab 11/05/19 2158 11/06/19 0059 11/06/19 0307 11/06/19 0721 11/06/19 1124  GLUCAP 437* 301* 260* 176* 127*   Lipid Profile: No results for input(s): CHOL, HDL, LDLCALC, TRIG, CHOLHDL, LDLDIRECT in the last 72 hours. Thyroid Function Tests: No results for input(s): TSH, T4TOTAL, FREET4, T3FREE, THYROIDAB in the last 72 hours. Anemia Panel: No results for input(s): VITAMINB12, FOLATE, FERRITIN, TIBC, IRON, RETICCTPCT in the last 72 hours. Sepsis Labs: Recent Labs  Lab 11/03/19 0423 11/03/19 0959  LATICACIDVEN 1.6 1.6    Recent Results (from the past 240 hour(s))  SARS CORONAVIRUS 2 (TAT 6-24 HRS) Nasopharyngeal Nasopharyngeal Swab     Status: None   Collection Time: 10/30/19 10:47 PM   Specimen: Nasopharyngeal Swab  Result Value Ref Range Status   SARS Coronavirus 2 NEGATIVE NEGATIVE Final    Comment: (NOTE) SARS-CoV-2 target nucleic acids are NOT DETECTED. The SARS-CoV-2 RNA is generally detectable in upper and lower respiratory specimens during the acute phase of infection. Negative results do not preclude SARS-CoV-2 infection, do not rule out co-infections with other pathogens, and should not be used as the sole basis for treatment or other patient management decisions. Negative results must be combined with clinical observations, patient history, and epidemiological information. The expected result is Negative. Fact Sheet for Patients: SugarRoll.be Fact Sheet for Healthcare Providers: https://www.woods-mathews.com/ This test is not yet approved or cleared by the [place] FDA and  has been authorized for detection and/or diagnosis of SARS-CoV-2 by FDA under an Emergency Use Authorization (EUA). This EUA will remain  in effect (meaning this test can be  used) for the duration of the COVID-19 declaration under Section 56 4(b)(1) of the Act, 21 U.S.C. section 360bbb-3(b)(1), unless the authorization is terminated or revoked sooner. Performed at [facility], [place] [address] [place], [place] [address]   SARS Coronavirus 2 by RT PCR (hospital order, performed in [facility] hospital lab) Nasopharyngeal Nasopharyngeal Swab     Status: None   Collection Time: 11/03/19  4:41 PM   Specimen: Nasopharyngeal Swab  Result Value Ref Range Status   SARS Coronavirus 2 NEGATIVE NEGATIVE Final    Comment: (NOTE) SARS-CoV-2 target nucleic acids are NOT DETECTED. The SARS-CoV-2 RNA is generally detectable in upper and lower respiratory specimens during the acute phase of infection. The lowest concentration of SARS-CoV-2 viral copies this assay can detect is 250 copies / mL. A negative result does not preclude SARS-CoV-2  infection and should not be used as the sole basis for treatment or other patient management decisions.  A negative result may occur with improper specimen collection / handling, submission of specimen other than nasopharyngeal swab, presence of viral mutation(s) within the areas targeted by this assay, and inadequate number of viral copies (<250 copies / mL). A negative result must be combined with clinical observations, patient history, and epidemiological information. Fact Sheet for Patients:   StrictlyIdeas.no Fact Sheet for Healthcare Providers: BankingDealers.co.za This test is not yet approved or cleared  by the [place] FDA and has been authorized for detection and/or diagnosis of SARS-CoV-2 by FDA under an Emergency Use Authorization (EUA).  This EUA will remain in effect (meaning this test can be used) for the duration of the COVID-19 declaration under Section 564(b)(1) of the Act, 21 U.S.C. section 360bbb-3(b)(1), unless the authorization is terminated or revoked  sooner. Performed at [facility], [address] [place], [place] [address]   CULTURE, BLOOD (ROUTINE X 2) w Reflex to ID Panel     Status: None (Preliminary result)   Collection Time: 11/05/19  8:26 AM   Specimen: BLOOD  Result Value Ref Range Status   Specimen Description BLOOD Blood Culture adequate volume  Final   Special Requests   Final    BOTTLES DRAWN AEROBIC AND ANAEROBIC BLOOD RIGHT HAND   Culture   Final    NO GROWTH < 24 HOURS Performed at [facility], [address], [place], [place] [address]    Report Status PENDING  Incomplete  CULTURE, BLOOD (ROUTINE X 2) w Reflex to ID Panel     Status: None (Preliminary result)   Collection Time: 11/05/19  8:26 AM   Specimen: BLOOD  Result Value Ref Range Status   Specimen Description BLOOD Blood Culture adequate volume  Final   Special Requests   Final    BOTTLES DRAWN AEROBIC AND ANAEROBIC BLOOD LEFT HAND   Culture  Setup Time   Final    GRAM POSITIVE COCCI ANAEROBIC BOTTLE ONLY CRITICAL RESULT CALLED TO, READ BACK BY AND VERIFIED WITH: SCOTT HALL 11/06/2019 AT 0115 HS Performed at [facility], [address] [place], [place] [address]    Culture [facility] POSITIVE COCCI  Final   Report Status PENDING  Incomplete         Radiology Studies: Dg Abd 1 View  Result Date: 11/05/2019 CLINICAL DATA:  Abdominal pain EXAM: ABDOMEN - 1 VIEW COMPARISON:  08/05/2016 FINDINGS: Formed stool throughout the proximal colon. The bowel gas pattern is nonobstructive. No concerning mass effect or calcification. Osteopenia attributed to quadriplegia. IMPRESSION: Normal bowel gas pattern with moderate stool volume. Electronically Signed   By: Monte Fantasia M.D.   On: 11/05/2019 14:03        Scheduled Meds: . aspirin EC  325 mg Oral Daily  . busPIRone  5 mg Oral BID  . enoxaparin (LOVENOX) injection  40 mg Subcutaneous Q24H  . insulin aspart  0-5 Units Subcutaneous QHS  . insulin aspart  0-6 Units  Subcutaneous TID WC  . insulin aspart  3 Units Subcutaneous TID WC  . insulin detemir  20 Units Subcutaneous QHS  . methenamine  1,000 mg Oral BID  . metoprolol tartrate  25 mg Oral BID  . pantoprazole  40 mg Oral Daily  . sodium bicarbonate  650 mg Oral Daily  . sucralfate  1 g Oral TID AC  . vitamin C  250 mg Oral Daily  Continuous Infusions: .  ceFAZolin (ANCEF) IV 2 g (11/06/19 0909)    Assessment & Plan:   Principal Problem:   DKA (diabetic ketoacidoses) ([place]) Active Problems:   Type 1 diabetes mellitus ([place])   Hypotension   Anemia   Paraplegia (H[facility])   AKI (acute kidney injury) ([place])  DKA Presents with pH 7.19, glucose 680, anion gap 25. Possibly not receiving enough insulin at rehab. -Was started on DKA protocol was started on admission now transition to subcu insulin Beta hydroxybutyrate acid decreasing anion gap closed, normalized -beta hydroxybutyrate resolving Continue  bicarb tablets Continue subcu insulin Levemir and NovoLog Monitor blood glucose closely DC'd IV fluids Increase Levemir to 20 units qd  Low-grade fever Per pharmacy note 1 of 4 bottles of blood cultures positive for GPC however I do not see this on her microbiology at all.   Patient was started on cefazolin 2 g IV every 8 hours We will continue with IV antibiotics for now until pending cultures result comes back Abdominal x-ray was unremarkable except moderate stool  hypotension history of "autonomic dysfunction" treated with Florinef.  During his last hospitalization he presented in a similar manner, notes suggest he was improved with fluids, but also placed on midodrine briefly at some point during that hospitalization.  Despite his BP < 90 mmHg systolic, there are no overt signs of infection, temp and WBC normal.  Certainly could be due to dehydration too -Solu-Cortef 50 MG Q6H for 24 hours- -> continue -bp stable now.  Acute metabolic encephalopathy Similar to previous presentation, was  very altered when in DKA and with hypotension. -Resolved with correction of his DKA -Mentation at baseline   Recent stroke No previous stroke until last hospitalization, patient was admitted altered, had an unresponsive episode.  Follow-up EEG showed no epileptic activity.   Hypercoagulable work-up negative except for mild hyperhomocysteinemia nonspecific. Follow-up MRI showed multifocal strokes, suspect to be embolic.   TEE negative, loop recorder implanted. -Continue aspirin -none during this hospitalization  Hyponatremia  Likely secondary to DKA/hyperglycemia and dehydration Improving Continue to monitor  Hypokalemia We will replace continue to monitor  acute kidney injury Baseline creatinine 0.6.   Likely due to prerenal azotemia Resolved with IV fluids continue to monitor   Elevated total bilirubin Suspect this is stress related -Trend and fractionate bilirubin in the morning   Hypertension -Hold metoprolol and hydralazine for now  Paraplegia Neurogenic bladder with recurrent UTI Was treated with ceftriaxone for UTI during last hospitalization.  Current urinalysis normal. -Continue baclofen -Continue oxycodone -Continue methenamine  GERD -Continue PPI, Carafate  Anxiety -Continue BuSpar  Macrocytic anemia Hemoglobin decreased from baseline.  Elevated homocysteine during coag work up last admission -> -B12 elevated levels TSH nml Folate level pending    PT/OT-evaluation for discharge planning   DVT prophylaxis: Lovenox  Code Status: DNR  Family Communication:   Disposition Plan:  Possible DC in 1 to 2 days if he remains clinically stable  LOS: 3 days   Time spent: 45 minutes with more than 50% COC    [name] [name], MD [facility] Pager [phone]  If 7PM-7AM, please contact night-coverage [email or web address] Password [facility] 11/06/2019, 11:28 AM Patient ID: [name], male   DOB: 11-19-1971, 48 y.o.   MRN:  [id number]
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
## 2019-11-06 NOTE — NC FL2 (Signed)
[place] LEVEL OF CARE SCREENING TOOL     IDENTIFICATION  Patient Name: [name] Birthdate: 10/02/1971 Sex: male Admission Date (Current Location): 11/03/2019  [place] and [place] Number:  Engineering geologist and Address:  [facility], [address], [place], [place] [address]      Provider Number: [id number]  Attending Physician Name and Address:  Nolberto [name], MD  Relative Name and Phone Number:  Gladney,Carolyn M Mother [phone] or [place], Klas Father   [phone]    Current Level of Care: Hospital Recommended Level of Care: [place] Prior Approval Number:    Date Approved/Denied:   PASRR Number: [id number]  Discharge Plan: SNF    Current Diagnoses: Patient Active Problem List   Diagnosis Date Noted  . AKI (acute kidney injury) ([name]) 11/03/2019  . Embolic stroke ([place])   . DKA (diabetic ketoacidoses) ([place]) 10/26/2019  . ARF (acute renal failure) ([place]) 10/26/2019  . Decubitus ulcer of back 10/20/2019  . Elevated blood pressure reading without diagnosis of hypertension 10/20/2019  . Hypomagnesemia 10/20/2019  . Rotator cuff tear 10/19/2019  . Muscle cramps 04/01/2019  . Hyponatremia 12/25/2018  . Healthcare maintenance 05/05/2018  . Depression, recurrent ([place]) 08/09/2017  . Skin ulcer ([place]) 06/05/2016  . Paraplegia ([place]) 06/05/2016  . Trigger finger of right hand 12/22/2015  . Food allergy 09/10/2014  . Anemia 08/20/2013  . Alcohol abuse 08/20/2013  . Edema 05/21/2013  . Syncope 12/15/2012  . Hypotension 11/27/2012  . Type 1 diabetes mellitus ([place]) 11/24/2012  . History of frequent urinary tract infections 11/24/2012    Orientation RESPIRATION BLADDER Height & Weight     Self, Time, Situation, Place  Normal Incontinent Weight: 180 lb 8 oz (81.9 kg) Height:  6' (182.9 cm)  BEHAVIORAL SYMPTOMS/MOOD NEUROLOGICAL BOWEL NUTRITION STATUS      Incontinent Diet(Carb Modified)   AMBULATORY STATUS COMMUNICATION OF NEEDS Skin   Limited Assist Verbally PU Stage and Appropriate Care, Surgical wounds     PU Stage 3 Dressing: Daily                 Personal Care Assistance Level of Assistance  Bathing, Dressing, Feeding Bathing Assistance: Limited assistance Feeding assistance: Independent Dressing Assistance: Limited assistance     Functional Limitations Info  Hearing, Sight, Speech Sight Info: Adequate Hearing Info: Adequate Speech Info: Adequate    SPECIAL CARE FACTORS FREQUENCY  PT (By licensed PT), OT (By licensed OT)     PT Frequency: Minimum 5x a week OT Frequency: Minimum 5x a week            Contractures Contractures Info: Not present    Additional Factors Info  Code Status, Allergies, Psychotropic, Insulin Sliding Scale Code Status Info: Full Code Allergies Info: Decongestant Pseudoephedrine Hcl, Ivp Dye, Sulfa Antibiotics Psychotropic Info: busPIRone (BUSPAR) tablet 5 mg Insulin Sliding Scale Info: insulin aspart (novoLOG) injection 0-6 Units 3x a day with meals       Current Medications (11/06/2019):  This is the current hospital active medication list Current Facility-Administered Medications  Medication Dose Route Frequency Provider Last Rate Last Dose  . acetaminophen (TYLENOL) tablet 650 mg  650 mg Oral Q6H PRN Edwin Dada, MD   650 mg at 11/06/19 1057   Or  . acetaminophen (TYLENOL) suppository 650 mg  650 mg Rectal Q6H PRN Danford, Suann Larry, MD      . aspirin EC tablet 325 mg  325 mg Oral Daily Danford, Suann Larry, MD  325 mg at 11/06/19 0858  . baclofen (LIORESAL) tablet 40 mg  40 mg Oral TID PRN Edwin Dada, MD   40 mg at 11/06/19 1252  . busPIRone (BUSPAR) tablet 5 mg  5 mg Oral BID Edwin Dada, MD   5 mg at 11/06/19 0858  . ceFAZolin (ANCEF) IVPB 2g/100 mL premix  2 g Intravenous Q8H Nolberto [name], MD 200 mL/hr at 11/06/19 0909 2 g at 11/06/19 0909  . dextrose 50 % solution 0-50  mL  0-50 mL Intravenous PRN Danford, Suann Larry, MD      . enoxaparin (LOVENOX) injection 40 mg  40 mg Subcutaneous Q24H Edwin Dada, MD   40 mg at 11/06/19 0857  . insulin aspart (novoLOG) injection 0-5 Units  0-5 Units Subcutaneous QHS Lang Snow, NP   5 Units at 11/05/19 2251  . insulin aspart (novoLOG) injection 0-6 Units  0-6 Units Subcutaneous TID WC Lang Snow, NP   1 Units at 11/06/19 [phone]  . insulin aspart (novoLOG) injection 3 Units  3 Units Subcutaneous TID WC Lang Snow, NP   3 Units at 11/06/19 1251  . insulin detemir (LEVEMIR) injection 20 Units  20 Units Subcutaneous QHS Nolberto [name], MD      . labetalol (NORMODYNE) injection 10 mg  10 mg Intravenous Q4H PRN Lang Snow, NP   10 mg at 11/05/19 1729  . methenamine (MANDELAMINE) tablet 1,000 mg  1,000 mg Oral BID Edwin Dada, MD   1,000 mg at 11/06/19 0858  . metoprolol tartrate (LOPRESSOR) tablet 25 mg  25 mg Oral BID Nolberto [name], MD   25 mg at 11/06/19 0857  . morphine 2 MG/ML injection 1 mg  1 mg Intravenous Q4H PRN Nolberto [name], MD   1 mg at 11/03/19 1934  . ondansetron (ZOFRAN) tablet 4 mg  4 mg Oral Q6H PRN Danford, Suann Larry, MD       Or  . ondansetron (ZOFRAN) injection 4 mg  4 mg Intravenous Q6H PRN Danford, Suann Larry, MD      . oxyCODONE (Oxy IR/ROXICODONE) immediate release tablet 5 mg  5 mg Oral Q4H PRN Edwin Dada, MD   5 mg at 11/06/19 1835  . pantoprazole (PROTONIX) EC tablet 40 mg  40 mg Oral Daily Edwin Dada, MD   40 mg at 11/06/19 0858  . phenylephrine-shark liver oil-mineral oil-petrolatum (PREPARATION H) rectal ointment   Rectal BID PRN Lang Snow, NP      . polyethylene glycol (MIRALAX / GLYCOLAX) packet 17 g  17 g Oral Daily Nolberto [name], MD   17 g at 11/06/19 1302  . sodium bicarbonate tablet 650 mg  650 mg Oral Daily Nolberto [name], MD   650 mg at 11/06/19 0857  . sucralfate (CARAFATE) tablet 1  g  1 g Oral TID AC Danford, Suann Larry, MD   1 g at 11/06/19 1754  . vitamin C (ASCORBIC ACID) tablet 250 mg  250 mg Oral Daily Nolberto [name], MD   250 mg at 11/06/19 6153     Discharge Medications: Please see discharge summary for a list of discharge medications.  Relevant Imaging Results:  Relevant Lab Results:   Additional Information SSN [id number]  Ross Ludwig, LCSW

## 2019-11-07 LAB — CBC
HCT: 32.3 % — ABNORMAL LOW (ref 39.0–52.0)
Hemoglobin: 11.3 g/dL — ABNORMAL LOW (ref 13.0–17.0)
MCH: 32.3 pg (ref 26.0–34.0)
MCHC: 35 g/dL (ref 30.0–36.0)
MCV: 92.3 fL (ref 80.0–100.0)
Platelets: 444 10*3/uL — ABNORMAL HIGH (ref 150–400)
RBC: 3.5 MIL/uL — ABNORMAL LOW (ref 4.22–5.81)
RDW: 12.5 % (ref 11.5–15.5)
WBC: 10.5 10*3/uL (ref 4.0–10.5)
nRBC: 0 % (ref 0.0–0.2)

## 2019-11-07 LAB — GLUCOSE, CAPILLARY
Glucose-Capillary: 137 mg/dL — ABNORMAL HIGH (ref 70–99)
Glucose-Capillary: 42 mg/dL — CL (ref 70–99)
Glucose-Capillary: 76 mg/dL (ref 70–99)
Glucose-Capillary: 227 mg/dL — ABNORMAL HIGH (ref 70–99)
Glucose-Capillary: 180 mg/dL — ABNORMAL HIGH (ref 70–99)
Glucose-Capillary: 197 mg/dL — ABNORMAL HIGH (ref 70–99)

## 2019-11-07 LAB — BASIC METABOLIC PANEL
Anion gap: 12 (ref 5–15)
BUN: 10 mg/dL (ref 6–20)
CO2: 27 mmol/L (ref 22–32)
Calcium: 8.9 mg/dL (ref 8.9–10.3)
Chloride: 91 mmol/L — ABNORMAL LOW (ref 98–111)
Creatinine, Ser: 0.63 mg/dL (ref 0.61–1.24)
GFR calc Af Amer: >60 mL/min (ref >60)
GFR calc non Af Amer: >60 mL/min (ref >60)
Glucose, Bld: 58 mg/dL — ABNORMAL LOW (ref 70–99)
Potassium: 3.8 mmol/L (ref 3.5–5.1)
Sodium: 130 mmol/L — ABNORMAL LOW (ref 135–145)

## 2019-11-07 LAB — URINE CULTURE
Culture: >=100000 — AB
Special Requests: NORMAL

## 2019-11-07 MED ORDER — LORAZEPAM 2 MG/ML IJ SOLN
1.0000 mg | Freq: Once | INTRAMUSCULAR | Status: AC
  Administered 2019-11-07: 1 mg via INTRAVENOUS
  Filled 2019-11-07: qty 1

## 2019-11-07 MED ORDER — SODIUM CHLORIDE 0.9 % IV SOLN
INTRAVENOUS | Status: DC | PRN
  Administered 2019-11-07: 1000 mL via INTRAVENOUS

## 2019-11-07 MED ORDER — SODIUM CHLORIDE 0.9 % IV SOLN
INTRAVENOUS | Status: DC
  Administered 2019-11-07 – 2019-11-09 (×4): via INTRAVENOUS

## 2019-11-07 MED ORDER — INSULIN DETEMIR 100 UNIT/ML ~~LOC~~ SOLN
15.0000 [IU] | Freq: Every day | SUBCUTANEOUS | Status: DC
  Administered 2019-11-07 – 2019-11-08 (×2): 15 [IU] via SUBCUTANEOUS
  Filled 2019-11-07 (×3): qty 0.15

## 2019-11-07 NOTE — Plan of Care (Signed)
  Problem: Clinical Measurements: Goal: Ability to maintain clinical measurements within normal limits will improve Outcome: Progressing Goal: Diagnostic test results will improve Outcome: Progressing   Problem: Activity: Goal: Risk for activity intolerance will decrease Outcome: Progressing   Problem: Elimination: Goal: Will not experience complications related to urinary retention Outcome: Progressing   Problem: Safety: Goal: Ability to remain free from injury will improve Outcome: Progressing   Problem: Clinical Measurements: Goal: Cardiovascular complication will be avoided Outcome: Not Progressing Note: Patient HR and BP elevated throughout the entire shift despite receiving multiple doses of PRN medication.

## 2019-11-07 NOTE — TOC Progression Note (Signed)
Transition of Care [facility]) - Progression Note    Patient Details  Name: [name] MRN: [id number] Date of Birth: [birth year]  Transition of Care [facility]) CM/SW Contact  Ross Ludwig, [place] Phone Number: 11/07/2019, 3:32 PM  Clinical Narrative:     CSW spoke with patient's mother she was going to talk to patient about having him go home with home health verse going back to SNF.  CSW informed her that it would be up to them to decide.  As of today, [facility] has not said they can accept patient back yet, CSW is watching for other offers.   Expected Discharge Plan: [place] Barriers to Discharge: Continued Medical Work up, Ship broker  Expected Discharge Plan and Services Expected Discharge Plan: [place] In-house Referral: Clinical Social Work Discharge Planning Services: NA Post Acute Care Choice: [place] Living arrangements for the past 2 months: [place], Single Family Home                                       Social Determinants of Health (SDOH) Interventions    Readmission Risk Interventions No flowsheet data found.
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
## 2019-11-07 NOTE — Plan of Care (Signed)
Pt's hypertensive and hyperglycemic state have resolved with BM (autonomic dysreflexia).  Pt was hypotensive with manual BP 95/50; denies n/v, dizziness.  Problem: Education: Goal: Knowledge of General Education information will improve Description: Including pain rating scale, medication(s)/side effects and non-pharmacologic comfort measures Outcome: Progressing   Problem: Health Behavior/Discharge Planning: Goal: Ability to manage health-related needs will improve Outcome: Progressing   Problem: Clinical Measurements: Goal: Ability to maintain clinical measurements within normal limits will improve Outcome: Progressing Goal: Will remain free from infection Outcome: Progressing Goal: Diagnostic test results will improve Outcome: Progressing Goal: Respiratory complications will improve Outcome: Progressing Goal: Cardiovascular complication will be avoided Outcome: Progressing   Problem: Activity: Goal: Risk for activity intolerance will decrease Outcome: Progressing   Problem: Nutrition: Goal: Adequate nutrition will be maintained Outcome: Progressing   Problem: Coping: Goal: Level of anxiety will decrease Outcome: Progressing   Problem: Elimination: Goal: Will not experience complications related to bowel motility Outcome: Progressing Goal: Will not experience complications related to urinary retention Outcome: Progressing   Problem: Pain Managment: Goal: General experience of comfort will improve Outcome: Progressing   Problem: Safety: Goal: Ability to remain free from injury will improve Outcome: Progressing   Problem: Skin Integrity: Goal: Risk for impaired skin integrity will decrease Outcome: Progressing

## 2019-11-07 NOTE — Progress Notes (Signed)
  Hypoglycemic Event  CBG: 42  Treatment: 8 oz orange juice  Symptoms: weakness  Follow-up CBG: Time: 0705 CBG Result:76  Possible Reasons for Event: poor po intake     [name]

## 2019-11-07 NOTE — Progress Notes (Signed)
Occupational Therapy Treatment Patient Details Name: [name] MRN: [id number] DOB: 01/21/1971 Today's Date: 11/07/2019    History of present illness Pt is a 48 y.o. M with hx of paraplegia from [facility] in 1988, type 1 diabetes, recent left shoulder surgery, DKA and cryptogenic embolic stroke who returns to the hospital with confusion, hyperglycemia.  MD assessment includes: DKA, hypotension with h/o autonomic dysfunction, acute metabolic encephalopathy resolving, recent CVA, hyponatremia, hypokalemia, AKI, HTN, Macrocytic anemia, and anxiety.   OT comments  Pt seen for OT This date to address safety and independence with modification of UB self care in setting of recent L shoulder sx in addition to increased weakness with acute hospitalization. In addition, plan was for transition/transfer trials to improve pt bed mobility, however, pt's SBP in 60s so transfer trial withheld this date and information reported to RN. Pt tolerates participation with self care relatively well given somewhat fatigued state and requires MOD A with UB bathing/dressing. At this time, SNF still appears to be best d/c dispo. However, HHOT could be appropriate depending on home support available outside of therapy.    Follow Up Recommendations  SNF(SNF versus HH therapy depending on available support.)    Equipment Recommendations  Other (comment)(TBD)    Recommendations for Other Services      Precautions / Restrictions Precautions Precautions: Shoulder;Fall Type of Shoulder Precautions: Per chart review from prior recent admission: He is to remain in his shoulder immobilizer at all times, removing it only for bathing purposes.  He may begin gentle passive range of motion exercises of the left shoulder with physical therapy in another week. (2wks from 11/23). Meanwhile, he can work with occupational therapy on range of motion and strength exercises for his wrist, fingers, and elbow. Shoulder Interventions:  Shoulder sling/immobilizer;At all times;Off for dressing/bathing/exercises Precaution Booklet Issued: No Precaution Comments: check BP Restrictions Weight Bearing Restrictions: Yes LUE Weight Bearing: Non weight bearing RLE Weight Bearing: Non weight bearing LLE Weight Bearing: Non weight bearing Other Position/Activity Restrictions: No L hip ABDuction ROM (per pt request d/t concerns for dislocation)       Mobility Bed Mobility Overal bed mobility: Needs Assistance Bed Mobility: Rolling Rolling: Min assist;+2 for physical assistance         General bed mobility comments: verbal cues to reduce use of L shoulder for in-bed rolling during ADL particiaption  Transfers                 General transfer comment: deferred due to BP    Balance       Sitting balance - Comments: deferred this date d/t BP                                   ADL either performed or assessed with clinical judgement   ADL           Upper Body Bathing: Moderate assistance Upper Body Bathing Details (indicate cue type and reason): to utilize seesaw method to wash and dry under L UE and to wash under R UE without breaking L shoulder precautions Lower Body Bathing: Maximal assistance;Bed level Lower Body Bathing Details (indicate cue type and reason): using lateral rolling technique Upper Body Dressing : Maximal assistance Upper Body Dressing Details (indicate cue type and reason): to doff t-shirt and don clean gown to front side at bed level while adhering to L shoulder precautions  Vision Patient Visual Report: No change from baseline     Perception     Praxis      Cognition Arousal/Alertness: Awake/alert Behavior During Therapy: WFL for tasks assessed/performed;Anxious Overall Cognitive Status: Within Functional Limits for tasks assessed                                 General Comments: somewhat lethargic/fatigued this  date. Pt describes blood sugar being off. In addition, pt describes feeling hot and is sweating on presentation. This is reported to RN.        Exercises     Shoulder Instructions       General Comments      Pertinent Vitals/ Pain       Pain Assessment: No/denies pain  Home Living                                          Prior Functioning/Environment              Frequency  Min 1X/week        Progress Toward Goals  OT Goals(current goals can now be found in the care plan section)  Progress towards OT goals: Progressing toward goals  Acute Rehab OT Goals Patient Stated Goal: To finish rehab and get stronger OT Goal Formulation: With patient Time For Goal Achievement: 11/20/19 Potential to Achieve Goals: Good  Plan      Co-evaluation    PT/OT/SLP Co-Evaluation/Treatment: Yes Reason for Co-Treatment: Complexity of the patient's impairments (multi-system involvement);For patient/therapist safety PT goals addressed during session: Mobility/safety with mobility OT goals addressed during session: ADL's and self-care      AM-PAC OT "6 Clicks" Daily Activity     Outcome Measure   Help from another person eating meals?: None Help from another person taking care of personal grooming?: A Little Help from another person toileting, which includes using toliet, bedpan, or urinal?: A Lot Help from another person bathing (including washing, rinsing, drying)?: A Lot Help from another person to put on and taking off regular upper body clothing?: A Lot Help from another person to put on and taking off regular lower body clothing?: A Lot 6 Click Score: 15    End of Session    OT Visit Diagnosis: Other abnormalities of gait and mobility (R26.89);Other symptoms and signs involving the nervous system (R29.898)   Activity Tolerance Patient limited by fatigue;Treatment limited secondary to medical complications (Comment)(low BP)   Patient Left in  bed;with call bell/phone within reach   Nurse Communication          Time: 3419-3790 OT Time Calculation (min): 34 min  Charges: OT General Charges $OT Visit: 1 Visit OT Treatments $Self Care/Home Management : 8-22 mins  Gerrianne Scale, MS, OTR/L ascom [phone] 11/07/19, 5:14 PM

## 2019-11-07 NOTE — Progress Notes (Signed)
Patient ID: [name], male   DOB: 18-Feb-1971, 48 y.o.   MRN: [id number]  PROGRESS NOTE    [name]  LAG:[id number] DOB: 07-Feb-1971 DOA: 11/03/2019 PCP: [name] Pheasant, MD    Brief Narrative:  [name] is a 48 y.o. M with hx paraplegia from [facility] in 1988, type 1 diabetes, and recent left shoulder surgery followed by admission for DKA and cryptogenic embolic stroke who turns to the hospital with confusion, hyperglycemia. Per report, the patient was just discharged to [facility] 2 days ago.  Since being there, the patient had been complaining to staff that they were not giving him enough insulin.  Then on the day of admission he had reported to staff that he was felt confused, glucose was too high to read and blood pressure was low, so EMS was called.  He has had no fever.  He denies chest discomfort, cough, abdominal pain.   Denies urinary symptoms. In the ER, blood pressure 84/41, he was confused. Heart rate normal, pulse ox normal.  Bicarb 8, glucose 680, anion gap elevated.  VBG showed pH 7.19.  Creatinine 1.55 from baseline 0.6.  Transaminases normal, total bilirubin 2.3.  WBC 11 point 2K, hemoglobin 10.6, down from 12, macrocytic.  Urinalysis without pyuria or hematuria or bacteria.  He was given a large bolus of IV fluids and started on insulin drip and the hospitalist service were asked to evaluate for admission. Mother reports that usually patient is good with managing his own diabetes and giving himself insulin but at the rehab they refused to let him do this.    Consultants:   none  Procedures: None  Antimicrobials:   None   Subjective: bp was elevated early this a.m..  Afebrile doing much better had a big bowel movement. found with blood glucose 58 this a.m. Objective: Vitals:   11/07/19 0618 11/07/19 0724 11/07/19 0830 11/07/19 1100  BP: (!) 142/109 (!) 193/136 (!) 195/130 108/74  Pulse: (!) 105 (!) 109  71  Resp:  19    Temp:  99.5 F  (37.5 C)    TempSrc:  Oral    SpO2: 97% 99%    Weight:      Height:        Intake/Output Summary (Last 24 hours) at 11/07/2019 1438 Last data filed at 11/07/2019 1008 Gross per 24 hour  Intake -  Output 5951 ml  Net -5951 ml   Filed Weights   11/03/19 1958 11/05/19 0351 11/07/19 0257  Weight: 76 kg 81.9 kg 79.9 kg    Examination:  General exam: Appears calm and comfortable , NAD Respiratory system: Cta, no r/r/w Cardiovascular system:RRR, S1 & S2 heard  No m/r/g  Gastrointestinal system: soft abd,  nondistended,  nontender.  +bs Central nervous system: Alert oriented x3  GU: condom cath in place Extremities: no edema Skin: warm, dry      Data Reviewed: I have personally reviewed following labs and imaging studies  CBC: Recent Labs  Lab 11/03/19 0159 11/03/19 0544 11/05/19 0243 11/07/19 0624  WBC 11.2*  --  11.6* 10.5  NEUTROABS 10.2*  --  9.5*  --   HGB 10.6*  --  9.7* 11.3*  HCT 32.7* 29.3* 26.9* 32.3*  MCV 101.2*  --  90.3 92.3  PLT 322  --  313 122*   Basic Metabolic Panel: Recent Labs  Lab 11/04/19 0524 11/04/19 0926 11/05/19 0243 11/06/19 0513 11/07/19 0624  NA 132* 135 127* 131* 130*  K 3.3*  3.3* 4.1 3.3* 3.8  CL 100 101 95* 95* 91*  CO2 20* 21* 22 24 27   GLUCOSE 132* 91 207* 202* 58*  BUN 26* 24* 20 12 10   CREATININE 0.77 0.68 0.78 0.51* 0.63  CALCIUM 8.7* 8.9 8.3* 8.2* 8.9   GFR: Estimated Creatinine Clearance: 122.6 mL/min (by C-G formula based on SCr of 0.63 mg/dL). Liver Function Tests: Recent Labs  Lab 11/03/19 0159 11/03/19 0423 11/05/19 0243  AST 28  --  25  ALT 16  --  15  ALKPHOS 84  --  81  BILITOT 2.3* 2.0* 0.8  PROT 6.1*  --  5.6*  ALBUMIN 3.4*  --  3.1*   No results for input(s): LIPASE, AMYLASE in the last 168 hours. No results for input(s): AMMONIA in the last 168 hours. Coagulation Profile: No results for input(s): INR, PROTIME in the last 168 hours. Cardiac Enzymes: No results for input(s): CKTOTAL, CKMB,  CKMBINDEX, TROPONINI in the last 168 hours. BNP (last 3 results) No results for input(s): PROBNP in the last 8760 hours. HbA1C: No results for input(s): HGBA1C in the last 72 hours. CBG: Recent Labs  Lab 11/06/19 2115 11/07/19 0645 11/07/19 0705 11/07/19 0726 11/07/19 1102  GLUCAP 313* 42* 76 137* 227*   Lipid Profile: No results for input(s): CHOL, HDL, LDLCALC, TRIG, CHOLHDL, LDLDIRECT in the last 72 hours. Thyroid Function Tests: No results for input(s): TSH, T4TOTAL, FREET4, T3FREE, THYROIDAB in the last 72 hours. Anemia Panel: No results for input(s): VITAMINB12, FOLATE, FERRITIN, TIBC, IRON, RETICCTPCT in the last 72 hours. Sepsis Labs: Recent Labs  Lab 11/03/19 0423 11/03/19 0959  LATICACIDVEN 1.6 1.6    Recent Results (from the past 240 hour(s))  SARS CORONAVIRUS 2 (TAT 6-24 HRS) Nasopharyngeal Nasopharyngeal Swab     Status: None   Collection Time: 10/30/19 10:47 PM   Specimen: Nasopharyngeal Swab  Result Value Ref Range Status   SARS Coronavirus 2 NEGATIVE NEGATIVE Final    Comment: (NOTE) SARS-CoV-2 target nucleic acids are NOT DETECTED. The SARS-CoV-2 RNA is generally detectable in upper and lower respiratory specimens during the acute phase of infection. Negative results do not preclude SARS-CoV-2 infection, do not rule out co-infections with other pathogens, and should not be used as the sole basis for treatment or other patient management decisions. Negative results must be combined with clinical observations, patient history, and epidemiological information. The expected result is Negative. Fact Sheet for Patients: SugarRoll.be Fact Sheet for Healthcare Providers: https://www.woods-mathews.com/ This test is not yet approved or cleared by the [place] FDA and  has been authorized for detection and/or diagnosis of SARS-CoV-2 by FDA under an Emergency Use Authorization (EUA). This EUA will remain  in effect  (meaning this test can be used) for the duration of the COVID-19 declaration under Section 56 4(b)(1) of the Act, 21 U.S.C. section 360bbb-3(b)(1), unless the authorization is terminated or revoked sooner. Performed at [facility], [place] [address] [place], [place] [address]   SARS Coronavirus 2 by RT PCR (hospital order, performed in [facility] hospital lab) Nasopharyngeal Nasopharyngeal Swab     Status: None   Collection Time: 11/03/19  4:41 PM   Specimen: Nasopharyngeal Swab  Result Value Ref Range Status   SARS Coronavirus 2 NEGATIVE NEGATIVE Final    Comment: (NOTE) SARS-CoV-2 target nucleic acids are NOT DETECTED. The SARS-CoV-2 RNA is generally detectable in upper and lower respiratory specimens during the acute phase of infection. The lowest concentration of SARS-CoV-2 viral copies this assay  can detect is 250 copies / mL. A negative result does not preclude SARS-CoV-2 infection and should not be used as the sole basis for treatment or other patient management decisions.  A negative result may occur with improper specimen collection / handling, submission of specimen other than nasopharyngeal swab, presence of viral mutation(s) within the areas targeted by this assay, and inadequate number of viral copies (<250 copies / mL). A negative result must be combined with clinical observations, patient history, and epidemiological information. Fact Sheet for Patients:   StrictlyIdeas.no Fact Sheet for Healthcare Providers: BankingDealers.co.za This test is not yet approved or cleared  by the [place] FDA and has been authorized for detection and/or diagnosis of SARS-CoV-2 by FDA under an Emergency Use Authorization (EUA).  This EUA will remain in effect (meaning this test can be used) for the duration of the COVID-19 declaration under Section 564(b)(1) of the Act, 21 U.S.C. section 360bbb-3(b)(1), unless the authorization  is terminated or revoked sooner. Performed at [facility], [address] [place], [place] [address]   CULTURE, BLOOD (ROUTINE X 2) w Reflex to ID Panel     Status: None (Preliminary result)   Collection Time: 11/05/19  8:26 AM   Specimen: BLOOD  Result Value Ref Range Status   Specimen Description BLOOD Blood Culture adequate volume  Final   Special Requests   Final    BOTTLES DRAWN AEROBIC AND ANAEROBIC BLOOD RIGHT HAND   Culture   Final    NO GROWTH 2 DAYS Performed at [facility], [address] [place], [place] [address]    Report Status PENDING  Incomplete  CULTURE, BLOOD (ROUTINE X 2) w Reflex to ID Panel     Status: Abnormal (Preliminary result)   Collection Time: 11/05/19  8:26 AM   Specimen: BLOOD  Result Value Ref Range Status   Specimen Description   Final    BLOOD Blood Culture adequate volume Performed at [facility], [address] [place], [place] [address]    Special Requests   Final    BOTTLES DRAWN AEROBIC AND ANAEROBIC BLOOD LEFT HAND Performed at [facility], [address], [place], [place] [address]    Culture  Setup Time   Final    GRAM POSITIVE COCCI ANAEROBIC BOTTLE ONLY CRITICAL RESULT CALLED TO, READ BACK BY AND VERIFIED WITH: SCOTT HALL 11/06/2019 AT 0115 HS Performed at [facility], [address] [place], [place] [address]    Culture (A)  Final    STAPHYLOCOCCUS SPECIES (COAGULASE NEGATIVE) THE SIGNIFICANCE OF ISOLATING THIS ORGANISM FROM A SINGLE SET OF BLOOD CULTURES WHEN MULTIPLE SETS ARE DRAWN IS UNCERTAIN. PLEASE NOTIFY THE MICROBIOLOGY DEPARTMENT WITHIN ONE WEEK IF SPECIATION AND SENSITIVITIES ARE REQUIRED. Performed at [facility], [place] [address] [place], [place] [address]    Report Status PENDING  Incomplete  Urine Culture     Status: Abnormal   Collection Time: 11/05/19 11:10 AM   Specimen: Urine, Clean Catch  Result Value Ref Range Status   Specimen Description    Final    URINE, CLEAN CATCH Performed at [facility], [address], [place], [place] [address]    Special Requests   Final    if not cath Normal Performed at [facility], [address] [place],  [address]    Culture >=100,000 COLONIES/mL STAPHYLOCOCCUS HOMINIS (A)  Final   Report Status 11/07/2019 FINAL  Final   Organism ID, Bacteria STAPHYLOCOCCUS HOMINIS (A)  Final      Susceptibility   Staphylococcus hominis - MIC*    CIPROFLOXACIN >=8 RESISTANT Resistant     GENTAMICIN <=0.5 SENSITIVE Sensitive     NITROFURANTOIN <=16 SENSITIVE Sensitive     OXACILLIN 1 RESISTANT Resistant     TETRACYCLINE >=16 RESISTANT Resistant     VANCOMYCIN 2 SENSITIVE Sensitive     TRIMETH/SULFA 40 SENSITIVE Sensitive     CLINDAMYCIN RESISTANT Resistant     RIFAMPIN <=0.5 SENSITIVE Sensitive     Inducible Clindamycin POSITIVE Resistant     * >=100,000 COLONIES/mL STAPHYLOCOCCUS HOMINIS         Radiology Studies: No results found.      Scheduled Meds: . aspirin EC  325 mg Oral Daily  . busPIRone  5 mg Oral BID  . enoxaparin (LOVENOX) injection  40 mg Subcutaneous Q24H  . insulin aspart  0-5 Units Subcutaneous QHS  . insulin aspart  0-6 Units Subcutaneous TID WC  . insulin aspart  3 Units Subcutaneous TID WC  . insulin detemir  15 Units Subcutaneous QHS  . methenamine  1,000 mg Oral BID  . metoprolol tartrate  25 mg Oral BID  . pantoprazole  40 mg Oral Daily  . polyethylene glycol  17 g Oral Daily  . sodium bicarbonate  650 mg Oral Daily  . sucralfate  1 g Oral TID AC  . vitamin C  1,000 mg Oral BID   Continuous Infusions: . sodium chloride 1,000 mL (11/07/19 0305)  . sodium chloride 50 mL/hr at 11/07/19 1105  .  ceFAZolin (ANCEF) IV 2 g (11/07/19 1109)    Assessment & Plan:   Principal Problem:   DKA (diabetic ketoacidoses) ([place]) Active Problems:   Type 1 diabetes mellitus ([place])   Hypotension   Anemia   Paraplegia ([facility])   AKI (acute kidney  injury) ([place])  DKA Presents with pH 7.19, glucose 680, anion gap 25. Possibly not receiving enough insulin at rehab. -Was started on DKA protocol was started on admission now transition to subcu insulin Beta hydroxybutyrate acid decreasing anion gap closed, normalized -beta hydroxybutyrate resolved Will d/c bicarb tablets Continue subcu insulin Levemir and NovoLog, decreased levemir to 15U as his am bg was low and he is eating healthier diet here . Monitor blood glucose closely   Low-grade fever Per pharmacy note 1 of 4 bottles of blood cultures positive for GPC , likely contamininet. ucx staph. Hom. - but resistant to multiple drugs, likely resistant to cefazolin, pharmacy rec. D/c as he may be contaminanted with this since uses condom cath. Will monitor off ivabx. Abdominal x-ray was unremarkable except moderate stool  hypotension history of "autonomic dysfunction" treated with Florinef.  During his last hospitalization he presented in a similar manner, notes suggest he was improved with fluids, but also placed on midodrine briefly at some point during that hospitalization.  Despite his BP < 90 mmHg systolic, there are no overt signs of infection, temp and WBC normal.  Certainly could be due to dehydration too -Solu-Cortef 50 MG Q6H for 24 hours- -> d/c'd  now with some hypertention, but also his abdominal spasm/pain may cause his bp to go up as he is uncomfortable  Acute metabolic encephalopathy Similar to previous presentation, was very altered when in DKA and with hypotension. -Resolved with correction of his DKA -Mentation at baseline   Recent stroke No previous stroke until last hospitalization, patient was admitted altered, had an unresponsive episode.  Follow-up EEG showed no epileptic activity.  Hypercoagulable work-up negative except for mild hyperhomocysteinemia nonspecific. Follow-up MRI showed multifocal strokes, suspect to be embolic.   TEE negative, loop recorder  implanted. -Continue aspirin -none during this hospitalization  Hyponatremia  Likely secondary to DKA/hyperglycemia and dehydration Will resume ivf gentle hydration as he is not eating as much  Hypokalemia We will replace continue to monitor  acute kidney injury Baseline creatinine 0.6.   Likely due to prerenal azotemia Resolved with IV fluids continue to monitor   Elevated total bilirubin Suspect this is stress related -Trend and fractionate bilirubin in the morning   Hypertension -Hold metoprolol and hydralazine for now  Paraplegia Neurogenic bladder with recurrent UTI Was treated with ceftriaxone for UTI during last hospitalization.  Current urinalysis normal. -Continue baclofen -Continue oxycodone -Continue methenamine  GERD -Continue PPI, Carafate  Anxiety -Continue BuSpar  Macrocytic anemia Hemoglobin decreased from baseline.  Elevated homocysteine during coag work up last admission -> -B12 elevated levels TSH nml Folate level pending    PT/OT-evaluation for discharge planning   DVT prophylaxis: Lovenox  Code Status: DNR  Family Communication:   Disposition Plan:  If afebrile and medically stable, can d/c back to rehab in 1-2 days  LOS: 4 days   Time spent: 45 minutes with more than 50% COC    Nolberto [name], MD [facility] Pager [phone]  If 7PM-7AM, please contact night-coverage [email or web address] Password [facility] 11/07/2019, 2:38 PM Patient ID: [name], male   DOB: 07-29-1971, 48 y.o.   MRN: [id number]

## 2019-11-07 NOTE — Progress Notes (Addendum)
BP 223/142 HR 136. Patient c/o of spasms related to GI distress. Patient believes that he needs to have a bowel movement; he reports that he digitally stimulates himself for 15-20 minutes daily at home. NP Ouma contacted about receiving order for digital stimulation. Per Ouma, we will not digitally stimulate patient due to bleeding hemorrhoids, Order placed for ativan. Will administer as ordered and will continue to monitor.   Update: Patient HR and BP continues to be elevated despite receiving treatment. NP Ouma notified. NP to come to bedside for further evaluation.   Update: Patients HR and BP elevated after receiving ativan. PRN labetalol given.    Iran Sizer M

## 2019-11-07 NOTE — Progress Notes (Signed)
Inpatient Diabetes Program Recommendations  AACE/ADA: New Consensus Statement on Inpatient Glycemic Control (2015)  Target Ranges:  Prepandial:   less than 140 mg/dL      [facility] postprandial:   less than 180 mg/dL (1-2 hours)      Critically ill patients:  140 - 180 mg/dL   Results for [name], [name] (MRN [id number]) as of 11/07/2019 07:22  Ref. Range 11/06/2019 00:59 11/06/2019 03:07 11/06/2019 07:21 11/06/2019 11:24 11/06/2019 16:48 11/06/2019 21:15  Glucose-Capillary Latest Ref Range: 70 - 99 mg/dL 301 (H) 260 (H) 176 (H)  4 units NOVOLOG  127 (H)  3 units NOVOLOG  65 (L) 313 (H)  4 units NOVOLOG +  20 units LEVEMIR   Results for [name], [name] (MRN [id number]) as of 11/07/2019 07:22  Ref. Range 11/07/2019 06:45  Glucose-Capillary Latest Ref Range: 70 - 99 mg/dL 42 (LL)    Home DM Meds: Levemir 29 units QHS Humalog 1-6 units TID with meals  Current Orders: Levemir 20 units QHS                            Novolog 0-6 units TID AC + HS                            Novolog 3 units TID with meals    MD- Note patient with Hypoglycemia this AM after getting 20 units Levemir last PM.  Please consider reducing Levemir to 16 units QHS (20% reduction)  Still needs basal insulin since he has Type 1 diabetes and does not make endogenous insulin     Spoke with patient during previous admission for Rotator cuff surgery on 11/13.  Patient told me he was taking Novolog (or Humalog--whatever the insurance will cover at the time) on the following basis: 3 units Novolog for every 15 grams of Carbohydrates consumed (or in other words, 1 unit for every 5 grams carbohydrates) and Novolog 1 unit for every 50 mg/dl above his target CBG of 120 mg/dl.  NOTE: Patient has Type 1 Diabetes--was just discharged on 11/01/19 and is from [facility]. Per note by Jani Gravel, RN on 10/31/19 patient had reported that [facility] took all his insulin  away from him which caused him to go into DKA. Jani Gravel, RN called nursing director at [facility] and was told that patient was checking glucose every 30 mins to 1 hour and giving himself insulin so they took his insulin away. Patient needs basal, correction, and carb coverage insulin as he has DM1 and makes no insulin. At time of discharge, would recommend discharging patient on insulin regimen of basal, meal coverage, and correction (could use insulin regimen similar to SQ regimen used while inpatient if glucose is well controlled).     --Will follow patient during hospitalization--  Wyn Quaker RN, MSN, CDE Diabetes Coordinator Inpatient Glycemic Control Team Team Pager: [phone] (8a-5p)

## 2019-11-08 ENCOUNTER — Other Ambulatory Visit: Payer: Self-pay | Admitting: Cardiovascular Disease

## 2019-11-08 ENCOUNTER — Telehealth: Payer: Self-pay | Admitting: Cardiovascular Disease

## 2019-11-08 DIAGNOSIS — L8990 Pressure ulcer of unspecified site, unspecified stage: Secondary | ICD-10-CM | POA: Insufficient documentation

## 2019-11-08 LAB — CULTURE, BLOOD (ROUTINE X 2): Specimen Description: ADEQUATE

## 2019-11-08 LAB — GLUCOSE, CAPILLARY
Glucose-Capillary: 129 mg/dL — ABNORMAL HIGH (ref 70–99)
Glucose-Capillary: 239 mg/dL — ABNORMAL HIGH (ref 70–99)
Glucose-Capillary: 311 mg/dL — ABNORMAL HIGH (ref 70–99)
Glucose-Capillary: 179 mg/dL — ABNORMAL HIGH (ref 70–99)
Glucose-Capillary: 155 mg/dL — ABNORMAL HIGH (ref 70–99)

## 2019-11-08 MED ORDER — INSULIN ASPART 100 UNIT/ML ~~LOC~~ SOLN
10.0000 [IU] | Freq: Three times a day (TID) | SUBCUTANEOUS | Status: DC
  Administered 2019-11-08 – 2019-11-09 (×2): 10 [IU] via SUBCUTANEOUS
  Filled 2019-11-08 (×2): qty 1

## 2019-11-08 MED ORDER — MIDODRINE HCL 5 MG PO TABS: 5.0000 mg | ORAL_TABLET | Freq: Three times a day (TID) | ORAL | 2 refills | Status: DC | PRN

## 2019-11-08 MED ORDER — METOPROLOL TARTRATE 25 MG PO TABS
12.5000 mg | ORAL_TABLET | Freq: Two times a day (BID) | ORAL | Status: DC
  Administered 2019-11-08: 12.5 mg via ORAL
  Filled 2019-11-08 (×2): qty 1

## 2019-11-08 NOTE — Telephone Encounter (Signed)
See other telephone encounter. Medications updated and patient aware.

## 2019-11-08 NOTE — Telephone Encounter (Signed)
Pt c/o medication issue:  1. Name of Medication: medication to raise BP If it drops   2. How are you currently taking this medication (dosage and times per day)? Unknown   3. Are you having a reaction (difficulty breathing--STAT)? No   4. What is your medication issue? [facility] to get rx for the medication he was given by gollan to raise bp if it drops.   Please send to [facility] in [place]

## 2019-11-08 NOTE — Progress Notes (Signed)
Pt reported feeling hyperglycemic.  CBG 311. Pt requesting dose of insulin between meals. Dr. Izetta Dakin updated, and received order for SSI now.

## 2019-11-08 NOTE — TOC Progression Note (Addendum)
Transition of Care [facility]) - Progression Note    Patient Details  Name: [name] MRN: [id number] Date of Birth: May 14, 1971  Transition of Care [facility]) CM/SW [place], LCSW Phone Number: 11/08/2019, 9:51 AM  Clinical Narrative: CSW met with patient. He confirmed plan will likely be to return home with home health. Patient will review CMS scores for agencies that serve his zip code. The last time he worked with a home health agency was around 15 years ago when he had a UTI. Patient's mother will be here around noon. Will follow up with them once she has arrived to discuss agency options. CSW made patient aware that his insurance is not a good payor for home health and therefore we may have issues finding someone to take him. Patient has a wheelchair at home. He said his parents are looking into renting a hoyer lift for him. Asked [employer] representative to find out cost for hoyer lift if we got one through them.    1:37 pm: Met with patient's mother. First preference for home health is [facility] (Left voicemail for representative), second preference is [facility] (not in network with his insurance plan). If [facility] cannot take him will start with highest rated. She has hired someone to stay with him as well. Patient's mother said it would cost around $100 per month for manual hoyer lift to be delivered from a company in [place]. [employer] representative said it will only cost $25-$30 per month for them to get it. Paged MD for hoyer lift order and gave [employer] representative patient's information. Patient will need EMS home. Confirmed address with mom. She will be at the house all day to let him in.  3:58 pm: [facility] declined referral. Left messages for [facility], [facility], [facility], [facility], and [facility]. [facility] only able to take [facility] Medicare patients that only need PT.  4:05 pm: [facility] and [facility] are checking to see if they can accept. [facility] and [facility]  declined.  Expected Discharge Plan: [place] Barriers to Discharge: Continued Medical Work up, Ship broker  Expected Discharge Plan and Services Expected Discharge Plan: [place] In-house Referral: Clinical Social Work Discharge Planning Services: NA Post Acute Care Choice: [place] Living arrangements for the past 2 months: [place], Single Family Home                                       Social Determinants of Health (SDOH) Interventions    Readmission Risk Interventions No flowsheet data found.

## 2019-11-08 NOTE — Telephone Encounter (Signed)
Spoke with patient and reviewed medication changes have been made with instructions. Also advised that referral has been put in for EP and that if they are not able to fit this in tomorrow then I would have scheduling call to set up appointment. He verbalized understanding of our conversation, agreement with plan, and had no further questions at this time.

## 2019-11-08 NOTE — Plan of Care (Signed)

## 2019-11-08 NOTE — Progress Notes (Addendum)
Patient ID: [name], male   DOB: 1971/11/05, 48 y.o.   MRN: [id number]  PROGRESS NOTE    Revin Corker Wuebker  ATF:[id number] DOB: Jul 27, 1971 DOA: 11/03/2019 PCP: Einar Pheasant, MD    Brief Narrative:  [name] is a 48 y.o. M with hx paraplegia from [facility] in 1988, type 1 diabetes, and recent left shoulder surgery followed by admission for DKA and cryptogenic embolic stroke who turns to the hospital with confusion, hyperglycemia. Per report, the patient was just discharged to [facility] 2 days ago.  Since being there, the patient had been complaining to staff that they were not giving him enough insulin.  Then on the day of admission he had reported to staff that he was felt confused, glucose was too high to read and blood pressure was low, so EMS was called.  He has had no fever.  He denies chest discomfort, cough, abdominal pain.   Denies urinary symptoms. In the ER, blood pressure 84/41, he was confused. Heart rate normal, pulse ox normal.  Bicarb 8, glucose 680, anion gap elevated.  VBG showed pH 7.19.  Creatinine 1.55 from baseline 0.6.  Transaminases normal, total bilirubin 2.3.  WBC 11 point 2K, hemoglobin 10.6, down from 12, macrocytic.  Urinalysis without pyuria or hematuria or bacteria.  He was given a large bolus of IV fluids and started on insulin drip and the hospitalist service were asked to evaluate for admission. Mother reports that usually patient is good with managing his own diabetes and giving himself insulin but at the rehab they refused to let him do this.    Consultants:   none  Procedures: None  Antimicrobials:   None   Subjective: Pt was lying on bed. Says he doesn't want to go back to the facility he came from. He wants to go home on discharge with [facility]. Denies any acute issues.    Objective: Vitals:   11/07/19 1500 11/07/19 1510 11/07/19 2128 11/08/19 0657  BP: (!) 67/46 95/60 (!) 97/56 (!) 102/59  Pulse: 64  (!) 55 72  Resp: 17  20  18   Temp: (!) 97.4 F (36.3 C)  (!) 97.5 F (36.4 C) 98 F (36.7 C)  TempSrc: Oral  Oral Oral  SpO2: 97%  98% 96%  Weight:      Height:        Intake/Output Summary (Last 24 hours) at 11/08/2019 1043 Last data filed at 11/08/2019 0932 Gross per 24 hour  Intake 1218.97 ml  Output 201 ml  Net 1017.97 ml   Filed Weights   11/03/19 1958 11/05/19 0351 11/07/19 0257  Weight: 76 kg 81.9 kg 79.9 kg    Examination:  General exam: Appears calm and comfortable , NAD Respiratory system: Cta, no r/r/w Cardiovascular system:RRR, S1 & S2 heard  No m/r/g  Gastrointestinal system: soft abd,  nondistended,  nontender.  +bs Central nervous system: Alert oriented x3  GU: condom cath in place Extremities: no edema Skin: warm, dry      Data Reviewed: I have personally reviewed following labs and imaging studies  CBC: Recent Labs  Lab 11/03/19 0159 11/03/19 0544 11/05/19 0243 11/07/19 0624  WBC 11.2*  --  11.6* 10.5  NEUTROABS 10.2*  --  9.5*  --   HGB 10.6*  --  9.7* 11.3*  HCT 32.7* 29.3* 26.9* 32.3*  MCV 101.2*  --  90.3 92.3  PLT 322  --  313 270*   Basic Metabolic Panel: Recent Labs  Lab 11/04/19  9450 11/04/19 0926 11/05/19 0243 11/06/19 0513 11/07/19 0624  NA 132* 135 127* 131* 130*  K 3.3* 3.3* 4.1 3.3* 3.8  CL 100 101 95* 95* 91*  CO2 20* 21* 22 24 27   GLUCOSE 132* 91 207* 202* 58*  BUN 26* 24* 20 12 10   CREATININE 0.77 0.68 0.78 0.51* 0.63  CALCIUM 8.7* 8.9 8.3* 8.2* 8.9   GFR: Estimated Creatinine Clearance: 122.6 mL/min (by C-G formula based on SCr of 0.63 mg/dL). Liver Function Tests: Recent Labs  Lab 11/03/19 0159 11/03/19 0423 11/05/19 0243  AST 28  --  25  ALT 16  --  15  ALKPHOS 84  --  81  BILITOT 2.3* 2.0* 0.8  PROT 6.1*  --  5.6*  ALBUMIN 3.4*  --  3.1*   No results for input(s): LIPASE, AMYLASE in the last 168 hours. No results for input(s): AMMONIA in the last 168 hours. Coagulation Profile: No results for input(s): INR, PROTIME in  the last 168 hours. Cardiac Enzymes: No results for input(s): CKTOTAL, CKMB, CKMBINDEX, TROPONINI in the last 168 hours. BNP (last 3 results) No results for input(s): PROBNP in the last 8760 hours. HbA1C: No results for input(s): HGBA1C in the last 72 hours. CBG: Recent Labs  Lab 11/07/19 0726 11/07/19 1102 11/07/19 1612 11/07/19 2122 11/08/19 0808  GLUCAP 137* 227* 180* 197* 129*   Lipid Profile: No results for input(s): CHOL, HDL, LDLCALC, TRIG, CHOLHDL, LDLDIRECT in the last 72 hours. Thyroid Function Tests: No results for input(s): TSH, T4TOTAL, FREET4, T3FREE, THYROIDAB in the last 72 hours. Anemia Panel: No results for input(s): VITAMINB12, FOLATE, FERRITIN, TIBC, IRON, RETICCTPCT in the last 72 hours. Sepsis Labs: Recent Labs  Lab 11/03/19 0423 11/03/19 0959  LATICACIDVEN 1.6 1.6    Recent Results (from the past 240 hour(s))  SARS CORONAVIRUS 2 (TAT 6-24 HRS) Nasopharyngeal Nasopharyngeal Swab     Status: None   Collection Time: 10/30/19 10:47 PM   Specimen: Nasopharyngeal Swab  Result Value Ref Range Status   SARS Coronavirus 2 NEGATIVE NEGATIVE Final    Comment: (NOTE) SARS-CoV-2 target nucleic acids are NOT DETECTED. The SARS-CoV-2 RNA is generally detectable in upper and lower respiratory specimens during the acute phase of infection. Negative results do not preclude SARS-CoV-2 infection, do not rule out co-infections with other pathogens, and should not be used as the sole basis for treatment or other patient management decisions. Negative results must be combined with clinical observations, patient history, and epidemiological information. The expected result is Negative. Fact Sheet for Patients: SugarRoll.be Fact Sheet for Healthcare Providers: https://www.woods-mathews.com/ This test is not yet approved or cleared by the [place] FDA and  has been authorized for detection and/or diagnosis of SARS-CoV-2  by FDA under an Emergency Use Authorization (EUA). This EUA will remain  in effect (meaning this test can be used) for the duration of the COVID-19 declaration under Section 56 4(b)(1) of the Act, 21 U.S.C. section 360bbb-3(b)(1), unless the authorization is terminated or revoked sooner. Performed at [facility], [place] [address] [place], [place] [address]   SARS Coronavirus 2 by RT PCR (hospital order, performed in [facility] hospital lab) Nasopharyngeal Nasopharyngeal Swab     Status: None   Collection Time: 11/03/19  4:41 PM   Specimen: Nasopharyngeal Swab  Result Value Ref Range Status   SARS Coronavirus 2 NEGATIVE NEGATIVE Final    Comment: (NOTE) SARS-CoV-2 target nucleic acids are NOT DETECTED. The SARS-CoV-2 RNA is generally detectable in upper  and lower respiratory specimens during the acute phase of infection. The lowest concentration of SARS-CoV-2 viral copies this assay can detect is 250 copies / mL. A negative result does not preclude SARS-CoV-2 infection and should not be used as the sole basis for treatment or other patient management decisions.  A negative result may occur with improper specimen collection / handling, submission of specimen other than nasopharyngeal swab, presence of viral mutation(s) within the areas targeted by this assay, and inadequate number of viral copies (<250 copies / mL). A negative result must be combined with clinical observations, patient history, and epidemiological information. Fact Sheet for Patients:   StrictlyIdeas.no Fact Sheet for Healthcare Providers: BankingDealers.co.za This test is not yet approved or cleared  by the [place] FDA and has been authorized for detection and/or diagnosis of SARS-CoV-2 by FDA under an Emergency Use Authorization (EUA).  This EUA will remain in effect (meaning this test can be used) for the duration of the COVID-19 declaration under  Section 564(b)(1) of the Act, 21 U.S.C. section 360bbb-3(b)(1), unless the authorization is terminated or revoked sooner. Performed at [facility], [address] [place], [place] [address]   CULTURE, BLOOD (ROUTINE X 2) w Reflex to ID Panel     Status: None (Preliminary result)   Collection Time: 11/05/19  8:26 AM   Specimen: BLOOD  Result Value Ref Range Status   Specimen Description BLOOD Blood Culture adequate volume  Final   Special Requests   Final    BOTTLES DRAWN AEROBIC AND ANAEROBIC BLOOD RIGHT HAND   Culture   Final    NO GROWTH 2 DAYS Performed at [facility], [address], [place], [place] [address]    Report Status PENDING  Incomplete  CULTURE, BLOOD (ROUTINE X 2) w Reflex to ID Panel     Status: Abnormal   Collection Time: 11/05/19  8:26 AM   Specimen: BLOOD  Result Value Ref Range Status   Specimen Description   Final    BLOOD Blood Culture adequate volume Performed at [facility], [address], [place], [place] [address]    Special Requests   Final    BOTTLES DRAWN AEROBIC AND ANAEROBIC BLOOD LEFT HAND Performed at [facility], [address] [place], [place] [address]    Culture  Setup Time   Final    GRAM POSITIVE COCCI ANAEROBIC BOTTLE ONLY CRITICAL RESULT CALLED TO, READ BACK BY AND VERIFIED WITH: SCOTT HALL 11/06/2019 AT 0115 HS Performed at [facility], [address] [place], [place] [address]    Culture (A)  Final    STAPHYLOCOCCUS SPECIES (COAGULASE NEGATIVE) THE SIGNIFICANCE OF ISOLATING THIS ORGANISM FROM A SINGLE SET OF BLOOD CULTURES WHEN MULTIPLE SETS ARE DRAWN IS UNCERTAIN. PLEASE NOTIFY THE MICROBIOLOGY DEPARTMENT WITHIN ONE WEEK IF SPECIATION AND SENSITIVITIES ARE REQUIRED. Performed at [facility], [place] [address], [place], [place] [address]    Report Status 11/08/2019 FINAL  Final  Urine Culture     Status: Abnormal   Collection Time: 11/05/19 11:10 AM   Specimen: Urine,  Clean Catch  Result Value Ref Range Status   Specimen Description   Final    URINE, CLEAN CATCH Performed at [facility], [address], [place], [place] [address]    Special Requests   Final    if not cath Normal Performed at [facility], [address] [place], [place] [address]    Culture >=100,000 COLONIES/mL STAPHYLOCOCCUS HOMINIS (A)  Final   Report Status 11/07/2019 FINAL  Final   Organism ID, Bacteria STAPHYLOCOCCUS HOMINIS (A)  Final      Susceptibility   Staphylococcus hominis - MIC*    CIPROFLOXACIN >=8 RESISTANT Resistant     GENTAMICIN <=0.5 SENSITIVE Sensitive     NITROFURANTOIN <=16 SENSITIVE Sensitive     OXACILLIN 1 RESISTANT Resistant     TETRACYCLINE >=16 RESISTANT Resistant     VANCOMYCIN 2 SENSITIVE Sensitive     TRIMETH/SULFA 40 SENSITIVE Sensitive     CLINDAMYCIN RESISTANT Resistant     RIFAMPIN <=0.5 SENSITIVE Sensitive     Inducible Clindamycin POSITIVE Resistant     * >=100,000 COLONIES/mL STAPHYLOCOCCUS HOMINIS         Radiology Studies: No results found.      Scheduled Meds: . aspirin EC  325 mg Oral Daily  . busPIRone  5 mg Oral BID  . enoxaparin (LOVENOX) injection  40 mg Subcutaneous Q24H  . insulin aspart  0-5 Units Subcutaneous QHS  . insulin aspart  0-6 Units Subcutaneous TID WC  . insulin aspart  3 Units Subcutaneous TID WC  . insulin detemir  15 Units Subcutaneous QHS  . methenamine  1,000 mg Oral BID  . metoprolol tartrate  12.5 mg Oral BID  . pantoprazole  40 mg Oral Daily  . polyethylene glycol  17 g Oral Daily  . sucralfate  1 g Oral TID AC  . vitamin C  1,000 mg Oral BID   Continuous Infusions: . sodium chloride Stopped (11/07/19 0345)  . sodium chloride 50 mL/hr at 11/07/19 1722    Assessment & Plan:   Principal Problem:   DKA (diabetic ketoacidoses) ([place]) Active Problems:   Type 1 diabetes mellitus ([place])   Hypotension   Anemia   Paraplegia ([facility])   AKI (acute kidney injury)  ([place])  DKA Resolved now  - Cont SSI and long acting insulin now Presents with pH 7.19, glucose 680, anion gap 25. Possibly not receiving enough insulin at rehab. -Was started on DKA protocol was started on admission now transition to subcu insulin Beta hydroxybutyrate acid decreasing anion gap closed, normalized -beta hydroxybutyrate resolved Will d/c bicarb tablets Continue subcu insulin Levemir and NovoLog,  levemir to 15U  - healthier diet here . Monitor blood glucose closely   Low-grade fever - No new spike  Per pharmacy note 1 of 4 bottles of blood cultures positive for GPC , likely contamininet. ucx staph. Hom. - but resistant to multiple drugs, likely resistant to cefazolin, pharmacy rec. D/c as he may be contaminanted with this since uses condom cath. Will monitor off ivabx. Abdominal x-ray was unremarkable except moderate stool  hypotension Slowly stabilizing  - history of "autonomic dysfunction" treated with Florinef.  During his last hospitalization he presented in a similar manner, notes suggest he was improved with fluids, but also placed on midodrine briefly at some point during that hospitalization.  Despite his BP < 90 mmHg systolic, there are no overt signs of infection, temp and WBC normal.  Certainly could be due to dehydration too -Solu-Cortef 50 MG Q6H for 24 hours- -> d/c'd - Transiently showed some HTN --> Started on BB with parameter  - Monitor     Acute metabolic encephalopathy Similar to previous presentation, was very altered when in DKA and with hypotension. -Resolved with correction of his DKA -Mentation at baseline   Recent stroke No previous stroke until last hospitalization, patient was admitted altered, had an unresponsive episode.  Follow-up EEG  showed no epileptic activity.   Hypercoagulable work-up negative except for mild hyperhomocysteinemia nonspecific. Follow-up MRI showed multifocal strokes, suspect to be embolic.   TEE negative   Loop recorder to be  Implanted on 11/09/2019 AM by Dr. Donia Ast. Spoke on the phone this PM. Appreciate care.   -Continue aspirin -none during this hospitalization  Hyponatremia  Stable in 130's  Likely secondary to DKA/hyperglycemia and dehydration Will resume ivf gentle hydration as he is not eating as much  Hypokalemia Resolved  - S/P replace continue to monitor  acute kidney injury - Stable  Baseline creatinine 0.6.   Likely due to prerenal azotemia Resolved with IV fluids continue to monitor   Elevated total bilirubin Suspect this is stress related -Trend and fractionate bilirubin in the morning   Hypertension - Running low currently  - Lower dose metoprolol and cont to hold hydrlazine   Paraplegia Neurogenic bladder with recurrent UTI Was treated with ceftriaxone for UTI during last hospitalization.  Current urinalysis normal. -Continue baclofen -Continue oxycodone -Continue methenamine  GERD -Continue PPI, Carafate  Anxiety -Continue BuSpar  Macrocytic anemia Hemoglobin decreased from baseline.  Elevated homocysteine during coag work up last admission -> -B12 elevated levels TSH nml Folate level pending    PT/OT-evaluation for discharge planning   DVT prophylaxis: Lovenox  Code Status: DNR  Family Communication:   Disposition Plan:  If afebrile and medically stable, can d/c back to rehab in 1-2 days  LOS: 5 days     Thornell Mule, MD [facility] Pager [phone]  If 7PM-7AM, please contact night-coverage [email or web address] Password [facility] 11/08/2019, 10:43 AM Patient ID: Bliss Tsang Graves, male   DOB: 1970/12/21, 48 y.o.   MRN: [id number]

## 2019-11-08 NOTE — Plan of Care (Signed)
  Problem: Clinical Measurements: Goal: Respiratory complications will improve Outcome: Progressing   Problem: Elimination: Goal: Will not experience complications related to bowel motility Outcome: Progressing Goal: Will not experience complications related to urinary retention Outcome: Progressing   Problem: Pain Managment: Goal: General experience of comfort will improve Outcome: Progressing

## 2019-11-08 NOTE — Addendum Note (Signed)
Addended by: Valora Corporal on: 11/08/2019 04:08 PM   Modules accepted: Orders

## 2019-11-08 NOTE — Telephone Encounter (Signed)
Called and spoke with provider and he gave verbal orders to discontinue Florinef and order midodrine 5 mg three times a day as needed. Reviewed that patient was interested in loop recorder and is currently admitted. Verbal order for EP referral for possible loop insertion. Order placed and will have scheduling set up appointment.

## 2019-11-08 NOTE — Progress Notes (Signed)
Notified of pts need for loop recorder following admission last week for Cryptogenic Stroke   Neg TEE; Spoken with hospitalist who is anticipating discharge in the next day or so  Spoke with the patient who is agreeable  Will plan in am

## 2019-11-08 NOTE — Telephone Encounter (Signed)
Spoke with patient and reviewed monitor options. 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 Reviewed other option provider suggested regarding the loop recorder.    Explained the procedure where they numb the skin, make small incision, insert monitor which is about the size of 2 match sticks, glue the incision, and put a band aide to cover it. Also that a bedside monitor downloads device each night. He was agreeable with this and advised I would call provider. He was appreciative for the call with no further questions at this time.
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
## 2019-11-09 ENCOUNTER — Telehealth: Payer: Self-pay

## 2019-11-09 ENCOUNTER — Encounter
Admission: EM | Disposition: A | Discharge status: Home Health | Payer: Medicare Other | Source: Home / Self Care | Attending: Internal Medicine

## 2019-11-09 DIAGNOSIS — G904 Autonomic dysreflexia: Secondary | ICD-10-CM | POA: Diagnosis not present

## 2019-11-09 DIAGNOSIS — I63443 Cerebral infarction due to embolism of bilateral cerebellar arteries: Secondary | ICD-10-CM | POA: Diagnosis not present

## 2019-11-09 DIAGNOSIS — G8220 Paraplegia, unspecified: Secondary | ICD-10-CM

## 2019-11-09 DIAGNOSIS — I6389 Other cerebral infarction: Secondary | ICD-10-CM

## 2019-11-09 HISTORY — PX: LOOP RECORDER INSERTION: EP1214

## 2019-11-09 LAB — GLUCOSE, CAPILLARY
Glucose-Capillary: 73 mg/dL (ref 70–99)
Glucose-Capillary: 66 mg/dL — ABNORMAL LOW (ref 70–99)
Glucose-Capillary: 96 mg/dL (ref 70–99)
Glucose-Capillary: 182 mg/dL — ABNORMAL HIGH (ref 70–99)

## 2019-11-09 SURGERY — LOOP RECORDER INSERTION
Anesthesia: LOCAL

## 2019-11-09 MED ORDER — FLEET ENEMA 7-19 GM/118ML RE ENEM
1.0000 | ENEMA | Freq: Every day | RECTAL | Status: DC | PRN
  Administered 2019-11-09: 1 via RECTAL
  Filled 2019-11-09: qty 1

## 2019-11-09 MED ORDER — INSULIN ASPART 100 UNIT/ML ~~LOC~~ SOLN: 0.0000 [IU] | Freq: Every day | SUBCUTANEOUS | 1 refills | Status: DC

## 2019-11-09 MED ORDER — BISACODYL 10 MG RE SUPP: 10.0000 mg | RECTAL | 0 refills | Status: DC | PRN

## 2019-11-09 MED ORDER — ACETAMINOPHEN 325 MG PO TABS: 650.0000 mg | ORAL_TABLET | Freq: Four times a day (QID) | ORAL | 0 refills | Status: DC | PRN

## 2019-11-09 MED ORDER — INSULIN LISPRO (1 UNIT DIAL) 100 UNIT/ML (KWIKPEN): PEN_INJECTOR | SUBCUTANEOUS | 1 refills | Status: DC

## 2019-11-09 MED ORDER — LIDOCAINE-EPINEPHRINE (PF) 1 %-1:200000 IJ SOLN
INTRAMUSCULAR | Status: AC
  Filled 2019-11-09: qty 10

## 2019-11-09 MED ORDER — POLYETHYLENE GLYCOL 3350 17 G PO PACK: 17.0000 g | PACK | Freq: Every day | ORAL | 0 refills | Status: DC

## 2019-11-09 MED ORDER — INSULIN ASPART 100 UNIT/ML ~~LOC~~ SOLN: 10.0000 [IU] | Freq: Three times a day (TID) | SUBCUTANEOUS | 0 refills | Status: DC

## 2019-11-09 MED ORDER — ONDANSETRON HCL 4 MG PO TABS: 4.0000 mg | ORAL_TABLET | Freq: Four times a day (QID) | ORAL | 0 refills | Status: DC | PRN

## 2019-11-09 MED ORDER — DILTIAZEM HCL 25 MG/5ML IV SOLN
10.0000 mg | Freq: Once | INTRAVENOUS | Status: AC
  Administered 2019-11-09: 10 mg via INTRAVENOUS
  Filled 2019-11-09: qty 5

## 2019-11-09 SURGICAL SUPPLY — 2 items
LOOP REVEAL LINQSYS (Prosthesis & Implant Heart) ×1 IMPLANT
PACK LOOP INSERTION (CUSTOM PROCEDURE TRAY) ×1 IMPLANT

## 2019-11-09 NOTE — Progress Notes (Addendum)
This RN attempted to get patients consent form signed. Patient reports that he had questions that he wanted answered before he gave consent. Patient agreeable to sign consent this AM after talking to MD.    Update: Patient HR and BP elevated. Patient experiencing spasms. Reports that he needs to have a bowel movement. MD Mansy notified. Orders given for fleet enema and one time dose of IV cardizem 10 mg.   Iran Sizer M

## 2019-11-09 NOTE — TOC Transition Note (Signed)
Transition of Care [facility]) - CM/SW Discharge Note   Patient Details  Name: Jamas Jaquay MRN: [id number] Date of Birth: September 04, 1971  [facility] CM/SW Contact:  Candie Chroman, LCSW Phone Number: 11/09/2019, 3:04 PM   Clinical Narrative: Patient has orders to discharge home today. RN will calll EMS to set up transport home. Patient's mother is aware. No further concerns. CSW signing off.    Final next level of care: [place] Barriers to Discharge: Barriers Resolved   Patient Goals and CMS Choice Patient states their goals for this hospitalization and ongoing recovery are:: To return to rehab, then return back home. CMS Medicare.gov Compare Post Acute Care list provided to:: Patient Represenative (must comment) Choice offered to / list presented to : Patient, Parent  Discharge Placement                Patient to be transferred to facility by: EMS Name of family member notified: Hoyle Sauer Ollinger Patient and family notified of of transfer: 11/09/19  Discharge Plan and Services In-house Referral: Clinical Social Work Discharge Planning Services: NA Post Acute Care Choice: [place]          DME Arranged: Hospital bed(Hoyer Lift) DME Agency: [facility] Date DME Agency Contacted: 11/09/19   Representative spoke with at DME Agency: [place] Arranged: RN, PT, OT, Nurse's Aide, Social Work [employer] Agency: [place] Date [facility] Agency Contacted: 11/09/19   Representative spoke with at [place]: [place]) Interventions     Readmission Risk Interventions No flowsheet data found.

## 2019-11-09 NOTE — Plan of Care (Signed)
  Problem: Clinical Measurements: Goal: Respiratory complications will improve Outcome: Progressing Goal: Cardiovascular complication will be avoided Outcome: Progressing   Problem: Pain Managment: Goal: General experience of comfort will improve Outcome: Progressing   Problem: Safety: Goal: Ability to remain free from injury will improve Outcome: Progressing

## 2019-11-09 NOTE — Progress Notes (Signed)
[name] to be D/C'd Home per MD order. Patient given discharge teaching and paperwork regarding medications, diet, follow-up appointments and activity. Patient understanding verbalized. No questions or complaints at this time. Skin condition as charted. IV and telemetry removed prior to leaving.  No further needs by Care Management/Social Work.  An After Visit Summary was printed and given to the patient.   EMS called for transport.  Terrilyn Saver

## 2019-11-09 NOTE — Telephone Encounter (Signed)
Spoke with patients father regarding insulin rx. Advised that insulin was sent in to [facility] by MD at [facility] today. Advised them to let us know if we need to do anything.

## 2019-11-09 NOTE — TOC Progression Note (Addendum)
Transition of Care [facility]) - Progression Note    Patient Details  Name: Ashton Belote MRN: [id number] Date of Birth: 12-Feb-1971  [facility] CM/SW [place], LCSW Phone Number: 11/09/2019, 8:52 AM  Clinical Narrative:  [facility] declined referral. Left another message for [facility].   10:04 am [facility] unable to accept. [facility] is not in network.  11:37 am Harrel Lemon lift was delivered this morning. Asked [facility] to review referral again.  1:35 pm: [facility] has accepted the home health referral for PT, OT, RN, aide, SW. They will start services on Saturday. Patient and his mom are aware. Patient and mom now agreeable to hospital bed. [facility] is aware.  Expected Discharge Plan: [place] Barriers to Discharge: Continued Medical Work up, Ship broker  Expected Discharge Plan and Services Expected Discharge Plan: [place] In-house Referral: Clinical Social Work Discharge Planning Services: NA Post Acute Care Choice: [place] Living arrangements for the past 2 months: [place], Single Family Home                                       Social Determinants of Health (SDOH) Interventions    Readmission Risk Interventions No flowsheet data found.

## 2019-11-09 NOTE — Progress Notes (Signed)
OT Cancellation Note  Patient Details Name: Hobart Marte Cohea MRN: [id number] DOB: 08/11/1971   Cancelled Treatment:    Reason Eval/Treat Not Completed: Patient at procedure or test/ unavailable  Pt at procedure to have implantable loop recorder placed to monitor for Afib. Will f/u as able/appropriate for OT tx.   Gerrianne Scale, [place], OTR/L ascom [phone] 11/09/19, 8:29 AM

## 2019-11-09 NOTE — Progress Notes (Signed)
Informed floor nurse that patient CBG was 66, patient was only agreeable to 1 cup of orange juice. Floor nurse stated that she would recheck it.
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
## 2019-11-09 NOTE — Telephone Encounter (Signed)
Reviewed the below information with Dr. Caryl Comes by phone last night.  As the patient has been back and forth between the hospital and [facility], and has mobility limitations, discussed with Dr. Caryl Comes the potential of having a LINQ implanted prior to his possible discharge today. Per Dr. Caryl Comes, he felt this was appropriate.  MD notified the patient's floor nurse and special procedures yesterday evening just prior to 5 pm. I notified the [employer] rep as well. The patient is scheduled to have his LINQ implanted this morning with Dr. Caryl Comes.
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
## 2019-11-09 NOTE — Clinical Social Work Note (Signed)
Patient has paraplegia which requires all body parts to be positioned in ways not feasible with a normal bed. Head must be elevated at least 30 degrees or more or trouble breathing can occur.  Dayton Scrape, [place]

## 2019-11-10 DIAGNOSIS — L89153 Pressure ulcer of sacral region, stage 3: Secondary | ICD-10-CM | POA: Diagnosis not present

## 2019-11-10 DIAGNOSIS — G8220 Paraplegia, unspecified: Secondary | ICD-10-CM | POA: Diagnosis not present

## 2019-11-10 LAB — CULTURE, BLOOD (ROUTINE X 2)
Culture: NO GROWTH
Specimen Description: ADEQUATE

## 2019-11-13 ENCOUNTER — Encounter: Payer: Self-pay | Admitting: Internal Medicine

## 2019-11-13 LAB — CULTURE, BLOOD (ROUTINE X 2)
Culture: NO GROWTH
Culture: NO GROWTH
Special Requests: ADEQUATE

## 2019-11-14 NOTE — Telephone Encounter (Signed)
[name] was d/c'd. Orders were given by the hospital when he was discharged. Patient is aware and will continue the PT with Dr Roland Rack. Confirmed pt doing ok.

## 2019-11-15 DIAGNOSIS — G904 Autonomic dysreflexia: Secondary | ICD-10-CM | POA: Diagnosis not present

## 2019-11-15 DIAGNOSIS — Z87440 Personal history of urinary (tract) infections: Secondary | ICD-10-CM | POA: Diagnosis not present

## 2019-11-15 DIAGNOSIS — K219 Gastro-esophageal reflux disease without esophagitis: Secondary | ICD-10-CM | POA: Diagnosis not present

## 2019-11-15 DIAGNOSIS — D439 Neoplasm of uncertain behavior of central nervous system, unspecified: Secondary | ICD-10-CM | POA: Diagnosis not present

## 2019-11-15 DIAGNOSIS — E109 Type 1 diabetes mellitus without complications: Secondary | ICD-10-CM | POA: Diagnosis not present

## 2019-11-15 DIAGNOSIS — N319 Neuromuscular dysfunction of bladder, unspecified: Secondary | ICD-10-CM | POA: Diagnosis not present

## 2019-11-15 DIAGNOSIS — I10 Essential (primary) hypertension: Secondary | ICD-10-CM | POA: Diagnosis not present

## 2019-11-15 DIAGNOSIS — G8220 Paraplegia, unspecified: Secondary | ICD-10-CM | POA: Diagnosis not present

## 2019-11-15 DIAGNOSIS — M75102 Unspecified rotator cuff tear or rupture of left shoulder, not specified as traumatic: Secondary | ICD-10-CM | POA: Diagnosis not present

## 2019-11-17 ENCOUNTER — Encounter: Payer: Self-pay | Admitting: Internal Medicine

## 2019-11-17 DIAGNOSIS — N319 Neuromuscular dysfunction of bladder, unspecified: Secondary | ICD-10-CM | POA: Diagnosis not present

## 2019-11-17 DIAGNOSIS — D439 Neoplasm of uncertain behavior of central nervous system, unspecified: Secondary | ICD-10-CM | POA: Diagnosis not present

## 2019-11-17 DIAGNOSIS — M75102 Unspecified rotator cuff tear or rupture of left shoulder, not specified as traumatic: Secondary | ICD-10-CM | POA: Diagnosis not present

## 2019-11-17 DIAGNOSIS — E109 Type 1 diabetes mellitus without complications: Secondary | ICD-10-CM | POA: Diagnosis not present

## 2019-11-17 DIAGNOSIS — G904 Autonomic dysreflexia: Secondary | ICD-10-CM | POA: Diagnosis not present

## 2019-11-17 DIAGNOSIS — G8220 Paraplegia, unspecified: Secondary | ICD-10-CM | POA: Diagnosis not present

## 2019-11-17 DIAGNOSIS — I10 Essential (primary) hypertension: Secondary | ICD-10-CM | POA: Diagnosis not present

## 2019-11-17 DIAGNOSIS — K219 Gastro-esophageal reflux disease without esophagitis: Secondary | ICD-10-CM | POA: Diagnosis not present

## 2019-11-17 DIAGNOSIS — Z87440 Personal history of urinary (tract) infections: Secondary | ICD-10-CM | POA: Diagnosis not present

## 2019-11-17 NOTE — Telephone Encounter (Signed)
Called patient to discuss. Confirmed no other issues. He is doing a lot of sitting in his wheelchair. I explained to him that could be what is contributing to the swelling and he also was recently in the hospital so could've been getting more fluids than what he is used to. Advised that the magnesium he was getting was for the cramping, not swelling. Confirmed no cramping or spasms. Advised patient to elevate legs as much as possible, decrease salt intake, limit alcohol intake, etc. Patient is going to monitor over the weekend and let us know how he is next week. +

## 2019-11-20 ENCOUNTER — Telehealth: Payer: Self-pay | Admitting: *Deleted

## 2019-11-20 DIAGNOSIS — E109 Type 1 diabetes mellitus without complications: Secondary | ICD-10-CM | POA: Diagnosis not present

## 2019-11-20 DIAGNOSIS — G8220 Paraplegia, unspecified: Secondary | ICD-10-CM | POA: Diagnosis not present

## 2019-11-20 DIAGNOSIS — K219 Gastro-esophageal reflux disease without esophagitis: Secondary | ICD-10-CM | POA: Diagnosis not present

## 2019-11-20 DIAGNOSIS — N319 Neuromuscular dysfunction of bladder, unspecified: Secondary | ICD-10-CM | POA: Diagnosis not present

## 2019-11-20 DIAGNOSIS — D439 Neoplasm of uncertain behavior of central nervous system, unspecified: Secondary | ICD-10-CM | POA: Diagnosis not present

## 2019-11-20 DIAGNOSIS — M75102 Unspecified rotator cuff tear or rupture of left shoulder, not specified as traumatic: Secondary | ICD-10-CM | POA: Diagnosis not present

## 2019-11-20 DIAGNOSIS — G904 Autonomic dysreflexia: Secondary | ICD-10-CM | POA: Diagnosis not present

## 2019-11-20 DIAGNOSIS — I10 Essential (primary) hypertension: Secondary | ICD-10-CM | POA: Diagnosis not present

## 2019-11-20 DIAGNOSIS — Z87440 Personal history of urinary (tract) infections: Secondary | ICD-10-CM | POA: Diagnosis not present

## 2019-11-20 NOTE — Telephone Encounter (Signed)
[facility] requesting call back from patient. Direct number and office hours provided.  Manual [employer] transmission received 11/17/19 at 22:12. 4 pause episodes noted since implant, most nocturnal. Event from 12/13 appears possible signal dropout. Pause and brady alerts off in Carelink per cryptogenic stroke protocol. Will educate patient about monitor automaticity and discuss any symptoms associated with episodes. Pt is also overdue for wound check.

## 2019-11-21 ENCOUNTER — Other Ambulatory Visit: Payer: Self-pay

## 2019-11-21 ENCOUNTER — Observation Stay
Admission: EM | Admit: 2019-11-21 | Discharge: 2019-11-22 | Disposition: A | Discharge status: Home / Self Care | Payer: Medicare Other | Attending: Internal Medicine | Admitting: Internal Medicine

## 2019-11-21 ENCOUNTER — Emergency Department: Payer: Medicare Other

## 2019-11-21 ENCOUNTER — Encounter: Payer: Self-pay | Admitting: Emergency Medicine

## 2019-11-21 DIAGNOSIS — F17290 Nicotine dependence, other tobacco product, uncomplicated: Secondary | ICD-10-CM | POA: Diagnosis not present

## 2019-11-21 DIAGNOSIS — J9690 Respiratory failure, unspecified, unspecified whether with hypoxia or hypercapnia: Secondary | ICD-10-CM | POA: Diagnosis present

## 2019-11-21 DIAGNOSIS — Z79899 Other long term (current) drug therapy: Secondary | ICD-10-CM | POA: Diagnosis not present

## 2019-11-21 DIAGNOSIS — Z8673 Personal history of transient ischemic attack (TIA), and cerebral infarction without residual deficits: Secondary | ICD-10-CM | POA: Insufficient documentation

## 2019-11-21 DIAGNOSIS — K219 Gastro-esophageal reflux disease without esophagitis: Secondary | ICD-10-CM | POA: Diagnosis not present

## 2019-11-21 DIAGNOSIS — G8250 Quadriplegia, unspecified: Secondary | ICD-10-CM | POA: Insufficient documentation

## 2019-11-21 DIAGNOSIS — Z20828 Contact with and (suspected) exposure to other viral communicable diseases: Secondary | ICD-10-CM | POA: Insufficient documentation

## 2019-11-21 DIAGNOSIS — E871 Hypo-osmolality and hyponatremia: Secondary | ICD-10-CM | POA: Insufficient documentation

## 2019-11-21 DIAGNOSIS — J9601 Acute respiratory failure with hypoxia: Secondary | ICD-10-CM

## 2019-11-21 DIAGNOSIS — Z7289 Other problems related to lifestyle: Secondary | ICD-10-CM | POA: Diagnosis not present

## 2019-11-21 DIAGNOSIS — J9691 Respiratory failure, unspecified with hypoxia: Principal | ICD-10-CM | POA: Insufficient documentation

## 2019-11-21 DIAGNOSIS — R0602 Shortness of breath: Secondary | ICD-10-CM | POA: Insufficient documentation

## 2019-11-21 DIAGNOSIS — J810 Acute pulmonary edema: Secondary | ICD-10-CM | POA: Diagnosis not present

## 2019-11-21 DIAGNOSIS — Z7982 Long term (current) use of aspirin: Secondary | ICD-10-CM | POA: Diagnosis not present

## 2019-11-21 DIAGNOSIS — R0689 Other abnormalities of breathing: Secondary | ICD-10-CM | POA: Diagnosis not present

## 2019-11-21 DIAGNOSIS — E109 Type 1 diabetes mellitus without complications: Secondary | ICD-10-CM | POA: Insufficient documentation

## 2019-11-21 DIAGNOSIS — F329 Major depressive disorder, single episode, unspecified: Secondary | ICD-10-CM | POA: Insufficient documentation

## 2019-11-21 DIAGNOSIS — I10 Essential (primary) hypertension: Secondary | ICD-10-CM | POA: Diagnosis not present

## 2019-11-21 DIAGNOSIS — G904 Autonomic dysreflexia: Secondary | ICD-10-CM | POA: Diagnosis not present

## 2019-11-21 DIAGNOSIS — F1010 Alcohol abuse, uncomplicated: Secondary | ICD-10-CM | POA: Insufficient documentation

## 2019-11-21 DIAGNOSIS — R069 Unspecified abnormalities of breathing: Secondary | ICD-10-CM | POA: Diagnosis not present

## 2019-11-21 DIAGNOSIS — Z87440 Personal history of urinary (tract) infections: Secondary | ICD-10-CM | POA: Insufficient documentation

## 2019-11-21 DIAGNOSIS — Z794 Long term (current) use of insulin: Secondary | ICD-10-CM | POA: Diagnosis not present

## 2019-11-21 DIAGNOSIS — U070 Vaping-related disorder: Secondary | ICD-10-CM | POA: Diagnosis not present

## 2019-11-21 DIAGNOSIS — R61 Generalized hyperhidrosis: Secondary | ICD-10-CM | POA: Diagnosis not present

## 2019-11-21 DIAGNOSIS — Z743 Need for continuous supervision: Secondary | ICD-10-CM | POA: Diagnosis not present

## 2019-11-21 LAB — BASIC METABOLIC PANEL
Anion gap: 10 (ref 5–15)
BUN: 14 mg/dL (ref 6–20)
CO2: 27 mmol/L (ref 22–32)
Calcium: 8.7 mg/dL — ABNORMAL LOW (ref 8.9–10.3)
Chloride: 93 mmol/L — ABNORMAL LOW (ref 98–111)
Creatinine, Ser: 0.51 mg/dL — ABNORMAL LOW (ref 0.61–1.24)
GFR calc Af Amer: >60 mL/min (ref >60)
GFR calc non Af Amer: >60 mL/min (ref >60)
Glucose, Bld: 165 mg/dL — ABNORMAL HIGH (ref 70–99)
Potassium: 4.5 mmol/L (ref 3.5–5.1)
Sodium: 130 mmol/L — ABNORMAL LOW (ref 135–145)

## 2019-11-21 LAB — CBC
HCT: 29.4 % — ABNORMAL LOW (ref 39.0–52.0)
HCT: 37.8 % — ABNORMAL LOW (ref 39.0–52.0)
Hemoglobin: 9.7 g/dL — ABNORMAL LOW (ref 13.0–17.0)
Hemoglobin: 12.1 g/dL — ABNORMAL LOW (ref 13.0–17.0)
MCH: 32.1 pg (ref 26.0–34.0)
MCH: 31.4 pg (ref 26.0–34.0)
MCHC: 33 g/dL (ref 30.0–36.0)
MCHC: 32 g/dL (ref 30.0–36.0)
MCV: 97.4 fL (ref 80.0–100.0)
MCV: 98.2 fL (ref 80.0–100.0)
Platelets: 334 10*3/uL (ref 150–400)
Platelets: 439 10*3/uL — ABNORMAL HIGH (ref 150–400)
RBC: 3.02 MIL/uL — ABNORMAL LOW (ref 4.22–5.81)
RBC: 3.85 MIL/uL — ABNORMAL LOW (ref 4.22–5.81)
RDW: 13.5 % (ref 11.5–15.5)
RDW: 13.5 % (ref 11.5–15.5)
WBC: 8.2 10*3/uL (ref 4.0–10.5)
WBC: 12.5 10*3/uL — ABNORMAL HIGH (ref 4.0–10.5)
nRBC: 0 % (ref 0.0–0.2)
nRBC: 0 % (ref 0.0–0.2)

## 2019-11-21 LAB — COMPREHENSIVE METABOLIC PANEL
ALT: 15 U/L (ref 0–44)
AST: 20 U/L (ref 15–41)
Albumin: 2.7 g/dL — ABNORMAL LOW (ref 3.5–5.0)
Alkaline Phosphatase: 110 U/L (ref 38–126)
Anion gap: 9 (ref 5–15)
BUN: 13 mg/dL (ref 6–20)
CO2: 28 mmol/L (ref 22–32)
Calcium: 8.5 mg/dL — ABNORMAL LOW (ref 8.9–10.3)
Chloride: 95 mmol/L — ABNORMAL LOW (ref 98–111)
Creatinine, Ser: 0.55 mg/dL — ABNORMAL LOW (ref 0.61–1.24)
GFR calc Af Amer: >60 mL/min (ref >60)
GFR calc non Af Amer: >60 mL/min (ref >60)
Glucose, Bld: 157 mg/dL — ABNORMAL HIGH (ref 70–99)
Potassium: 4.6 mmol/L (ref 3.5–5.1)
Sodium: 132 mmol/L — ABNORMAL LOW (ref 135–145)
Total Bilirubin: 0.5 mg/dL (ref 0.3–1.2)
Total Protein: 5.7 g/dL — ABNORMAL LOW (ref 6.5–8.1)

## 2019-11-21 LAB — SARS CORONAVIRUS 2 (TAT 6-24 HRS): SARS Coronavirus 2: NEGATIVE

## 2019-11-21 LAB — MAGNESIUM: Magnesium: 1.8 mg/dL (ref 1.7–2.4)

## 2019-11-21 LAB — BRAIN NATRIURETIC PEPTIDE: B Natriuretic Peptide: 530 pg/mL — ABNORMAL HIGH (ref 0.0–100.0)

## 2019-11-21 LAB — GLUCOSE, CAPILLARY
Glucose-Capillary: 167 mg/dL — ABNORMAL HIGH (ref 70–99)
Glucose-Capillary: 204 mg/dL — ABNORMAL HIGH (ref 70–99)
Glucose-Capillary: 451 mg/dL — ABNORMAL HIGH (ref 70–99)

## 2019-11-21 LAB — LACTATE DEHYDROGENASE: LDH: 189 U/L (ref 98–192)

## 2019-11-21 LAB — FIBRINOGEN: Fibrinogen: 508 mg/dL — ABNORMAL HIGH (ref 210–475)

## 2019-11-21 LAB — FERRITIN: Ferritin: 41 ng/mL (ref 24–336)

## 2019-11-21 LAB — TRIGLYCERIDES: Triglycerides: 38 mg/dL (ref <150)

## 2019-11-21 LAB — PROCALCITONIN: Procalcitonin: <0.1 ng/mL

## 2019-11-21 LAB — PHOSPHORUS: Phosphorus: 4.5 mg/dL (ref 2.5–4.6)

## 2019-11-21 LAB — GLUCOSE, RANDOM: Glucose, Bld: 556 mg/dL (ref 70–99)

## 2019-11-21 LAB — C-REACTIVE PROTEIN: CRP: 3.2 mg/dL — ABNORMAL HIGH (ref <1.0)

## 2019-11-21 LAB — TROPONIN I (HIGH SENSITIVITY)
Troponin I (High Sensitivity): 30 ng/L — ABNORMAL HIGH (ref <18)
Troponin I (High Sensitivity): 36 ng/L — ABNORMAL HIGH (ref <18)

## 2019-11-21 LAB — FIBRIN DERIVATIVES D-DIMER (ARMC ONLY): Fibrin derivatives D-dimer (ARMC): 1811.69 ng/mL (FEU) — ABNORMAL HIGH (ref 0.00–499.00)

## 2019-11-21 LAB — LACTIC ACID, PLASMA: Lactic Acid, Venous: 0.9 mmol/L (ref 0.5–1.9)

## 2019-11-21 LAB — POC SARS CORONAVIRUS 2 AG: SARS Coronavirus 2 Ag: NEGATIVE

## 2019-11-21 MED ORDER — FLEET ENEMA 7-19 GM/118ML RE ENEM: 1.0000 | ENEMA | Freq: Once | RECTAL | Status: DC | PRN

## 2019-11-21 MED ORDER — BACLOFEN 10 MG PO TABS
40.0000 mg | ORAL_TABLET | Freq: Three times a day (TID) | ORAL | Status: DC
  Administered 2019-11-21 – 2019-11-22 (×3): 40 mg via ORAL
  Filled 2019-11-21 (×7): qty 4

## 2019-11-21 MED ORDER — ADULT MULTIVITAMIN W/MINERALS CH
1.0000 | ORAL_TABLET | Freq: Every day | ORAL | Status: DC
  Administered 2019-11-21 – 2019-11-22 (×2): 1 via ORAL
  Filled 2019-11-21 (×2): qty 1

## 2019-11-21 MED ORDER — PANTOPRAZOLE SODIUM 40 MG PO TBEC
40.0000 mg | DELAYED_RELEASE_TABLET | Freq: Every day | ORAL | Status: DC
  Administered 2019-11-21 – 2019-11-22 (×2): 40 mg via ORAL
  Filled 2019-11-21 (×2): qty 1

## 2019-11-21 MED ORDER — HYDROCODONE-ACETAMINOPHEN 7.5-325 MG PO TABS
1.0000 | ORAL_TABLET | Freq: Once | ORAL | Status: AC
  Administered 2019-11-22: 1 via ORAL
  Filled 2019-11-21: qty 1

## 2019-11-21 MED ORDER — INSULIN ASPART 100 UNIT/ML ~~LOC~~ SOLN: 0.0000 [IU] | Freq: Three times a day (TID) | SUBCUTANEOUS | Status: DC

## 2019-11-21 MED ORDER — ACETAMINOPHEN 650 MG RE SUPP: 650.0000 mg | Freq: Four times a day (QID) | RECTAL | Status: DC | PRN

## 2019-11-21 MED ORDER — FOLIC ACID 1 MG PO TABS
1.0000 mg | ORAL_TABLET | Freq: Every day | ORAL | Status: DC
  Administered 2019-11-21 – 2019-11-22 (×2): 1 mg via ORAL
  Filled 2019-11-21 (×2): qty 1

## 2019-11-21 MED ORDER — ACETAMINOPHEN 325 MG PO TABS: 650.0000 mg | ORAL_TABLET | Freq: Four times a day (QID) | ORAL | Status: DC | PRN

## 2019-11-21 MED ORDER — POLYETHYLENE GLYCOL 3350 17 G PO PACK
17.0000 g | PACK | Freq: Every day | ORAL | Status: DC
  Administered 2019-11-21: 17 g via ORAL
  Filled 2019-11-21 (×2): qty 1

## 2019-11-21 MED ORDER — THIAMINE HCL 100 MG/ML IJ SOLN
100.0000 mg | Freq: Every day | INTRAMUSCULAR | Status: DC
  Administered 2019-11-21: 100 mg via INTRAVENOUS
  Filled 2019-11-21 (×2): qty 2

## 2019-11-21 MED ORDER — INSULIN ASPART 100 UNIT/ML ~~LOC~~ SOLN: 0.0000 [IU] | Freq: Every day | SUBCUTANEOUS | Status: DC

## 2019-11-21 MED ORDER — INSULIN ASPART 100 UNIT/ML ~~LOC~~ SOLN
0.0000 [IU] | Freq: Three times a day (TID) | SUBCUTANEOUS | Status: DC
  Administered 2019-11-21: 3 [IU] via SUBCUTANEOUS
  Filled 2019-11-21: qty 1

## 2019-11-21 MED ORDER — ENOXAPARIN SODIUM 40 MG/0.4ML ~~LOC~~ SOLN
40.0000 mg | SUBCUTANEOUS | Status: DC
  Administered 2019-11-21: 40 mg via SUBCUTANEOUS
  Filled 2019-11-21 (×2): qty 0.4

## 2019-11-21 MED ORDER — METOPROLOL TARTRATE 25 MG PO TABS
25.0000 mg | ORAL_TABLET | Freq: Two times a day (BID) | ORAL | Status: DC
  Administered 2019-11-21 – 2019-11-22 (×3): 25 mg via ORAL
  Filled 2019-11-21 (×3): qty 1

## 2019-11-21 MED ORDER — INSULIN DETEMIR 100 UNIT/ML ~~LOC~~ SOLN
12.0000 [IU] | Freq: Every day | SUBCUTANEOUS | Status: DC
  Administered 2019-11-21: 12 [IU] via SUBCUTANEOUS
  Filled 2019-11-21 (×2): qty 0.12

## 2019-11-21 MED ORDER — DEXAMETHASONE SODIUM PHOSPHATE 10 MG/ML IJ SOLN
6.0000 mg | Freq: Once | INTRAMUSCULAR | Status: AC
  Administered 2019-11-21: 6 mg via INTRAVENOUS
  Filled 2019-11-21: qty 1

## 2019-11-21 MED ORDER — INSULIN ASPART 100 UNIT/ML ~~LOC~~ SOLN
20.0000 [IU] | Freq: Once | SUBCUTANEOUS | Status: AC
  Administered 2019-11-22: 20 [IU] via SUBCUTANEOUS

## 2019-11-21 MED ORDER — INSULIN ASPART 100 UNIT/ML ~~LOC~~ SOLN: 0.0000 [IU] | SUBCUTANEOUS | Status: DC

## 2019-11-21 MED ORDER — BUSPIRONE HCL 10 MG PO TABS
5.0000 mg | ORAL_TABLET | Freq: Two times a day (BID) | ORAL | Status: DC
  Administered 2019-11-21 – 2019-11-22 (×3): 5 mg via ORAL
  Filled 2019-11-21 (×3): qty 1

## 2019-11-21 MED ORDER — ONDANSETRON HCL 4 MG/2ML IJ SOLN: 4.0000 mg | Freq: Four times a day (QID) | INTRAMUSCULAR | Status: DC | PRN

## 2019-11-21 MED ORDER — ONDANSETRON HCL 4 MG PO TABS: 4.0000 mg | ORAL_TABLET | Freq: Four times a day (QID) | ORAL | Status: DC | PRN

## 2019-11-21 MED ORDER — FUROSEMIDE 10 MG/ML IJ SOLN
40.0000 mg | Freq: Two times a day (BID) | INTRAMUSCULAR | Status: DC
  Administered 2019-11-22: 40 mg via INTRAVENOUS
  Filled 2019-11-21: qty 4

## 2019-11-21 MED ORDER — LORAZEPAM 1 MG PO TABS: 1.0000 mg | ORAL_TABLET | ORAL | Status: DC | PRN

## 2019-11-21 MED ORDER — LORAZEPAM 2 MG/ML IJ SOLN: 1.0000 mg | INTRAMUSCULAR | Status: DC | PRN

## 2019-11-21 MED ORDER — INSULIN DETEMIR 100 UNIT/ML ~~LOC~~ SOLN: 25.0000 [IU] | Freq: Every day | SUBCUTANEOUS | Status: DC

## 2019-11-21 MED ORDER — METHENAMINE MANDELATE 1 G PO TABS
1000.0000 mg | ORAL_TABLET | Freq: Two times a day (BID) | ORAL | Status: DC
  Administered 2019-11-21: 1000 mg via ORAL
  Filled 2019-11-21 (×3): qty 1

## 2019-11-21 MED ORDER — THIAMINE HCL 100 MG PO TABS
100.0000 mg | ORAL_TABLET | Freq: Every day | ORAL | Status: DC
  Administered 2019-11-22: 100 mg via ORAL
  Filled 2019-11-21: qty 1

## 2019-11-21 MED ORDER — INSULIN ASPART 100 UNIT/ML ~~LOC~~ SOLN
SUBCUTANEOUS | Status: AC
  Filled 2019-11-21: qty 1

## 2019-11-21 MED ORDER — ASPIRIN EC 325 MG PO TBEC
325.0000 mg | DELAYED_RELEASE_TABLET | Freq: Every day | ORAL | Status: DC
  Administered 2019-11-21 – 2019-11-22 (×2): 325 mg via ORAL
  Filled 2019-11-21 (×2): qty 1

## 2019-11-21 MED ORDER — BISACODYL 10 MG RE SUPP
10.0000 mg | RECTAL | Status: DC | PRN
  Filled 2019-11-21: qty 1

## 2019-11-21 MED ORDER — BACLOFEN 10 MG PO TABS
40.0000 mg | ORAL_TABLET | Freq: Three times a day (TID) | ORAL | Status: DC | PRN
  Administered 2019-11-21: 40 mg via ORAL
  Filled 2019-11-21 (×2): qty 4

## 2019-11-21 MED ORDER — INSULIN ASPART 100 UNIT/ML ~~LOC~~ SOLN
0.0000 [IU] | SUBCUTANEOUS | Status: DC
  Administered 2019-11-22: 15 [IU] via SUBCUTANEOUS
  Administered 2019-11-22: 5 [IU] via SUBCUTANEOUS
  Administered 2019-11-22: 2 [IU] via SUBCUTANEOUS
  Filled 2019-11-21 (×3): qty 1

## 2019-11-21 MED ORDER — INSULIN ASPART 100 UNIT/ML ~~LOC~~ SOLN: 2.0000 [IU] | Freq: Three times a day (TID) | SUBCUTANEOUS | Status: DC

## 2019-11-21 MED ORDER — FUROSEMIDE 10 MG/ML IJ SOLN
40.0000 mg | Freq: Once | INTRAMUSCULAR | Status: AC
  Administered 2019-11-21: 40 mg via INTRAVENOUS
  Filled 2019-11-21: qty 4

## 2019-11-21 NOTE — ED Notes (Signed)
Called pt mother to update her.

## 2019-11-21 NOTE — ED Triage Notes (Signed)
Pt arrived via EMS from home with sudden onset of SOB. Pt diaphoretic on arrival to ED. Pt received 1 Duo neb in route. O2 when EMS arrived at pts home was 77% RA. CBG 156. O2 on arrival to ED is 88% RA.

## 2019-11-21 NOTE — ED Notes (Signed)
Pt requesting for 53m baclofen for muscle spasms. Will inform EDP.
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
## 2019-11-21 NOTE — Progress Notes (Signed)
Pt BSG 451. MD notified and lab draw ordered as directed. [email or web address],rn
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
## 2019-11-21 NOTE — ED Notes (Signed)
Admitting MD at bedside.

## 2019-11-21 NOTE — ED Notes (Signed)
Foley noted at this time- pt arrived with foley catheter.

## 2019-11-21 NOTE — ED Notes (Signed)
Chrys Racer (mother) [phone]

## 2019-11-21 NOTE — Progress Notes (Signed)
Pt SBP high. Pt shaking from from autonomic syndrome (temp control). Difficult to get accurate b/p on pt. Pt is alert and oriented with no complaints. Will repeat b/p. Pdowless, rn

## 2019-11-21 NOTE — ED Notes (Signed)
Attempted to call pt mother to inform her of inpatient room but did not answer.

## 2019-11-21 NOTE — Telephone Encounter (Signed)
LMOM for patient to call office.  Additional pause recorded 11/20/19 @ 0833.

## 2019-11-21 NOTE — ED Notes (Signed)
Pt taken to CT at this time. Pt remains super diaphoretic.

## 2019-11-21 NOTE — ED Notes (Signed)
Pt denies fever, CP, N&V&D, cough, congestion. States SOB began last night. A&O, extremely diaphoretic still. Speaking in complete sentences. No resp distress noted at this time.

## 2019-11-21 NOTE — ED Notes (Signed)
Pt provided lunch tray. Pt sat up to eat. Mother assisting pt in eating.

## 2019-11-21 NOTE — ED Notes (Signed)
X-ray at bedside

## 2019-11-21 NOTE — ED Notes (Signed)
Report given to Caryl Pina, RN at this time.

## 2019-11-21 NOTE — ED Notes (Signed)
Hospitalist at bedside to discuss insulin dosage with pt and mom.

## 2019-11-21 NOTE — ED Notes (Signed)
Previous IV had come out. Replaced at this time with blood drawn. Wrapped IV in gauze to prevent IV from coming out again. Pt tolerated well.

## 2019-11-21 NOTE — Progress Notes (Signed)
Serum BG completed. Critical lab of Glucose 556. MD paged. pdowless,rn

## 2019-11-22 ENCOUNTER — Telehealth: Payer: Self-pay | Admitting: Internal Medicine

## 2019-11-22 DIAGNOSIS — U070 Vaping-related disorder: Secondary | ICD-10-CM | POA: Diagnosis not present

## 2019-11-22 DIAGNOSIS — J9601 Acute respiratory failure with hypoxia: Secondary | ICD-10-CM | POA: Diagnosis not present

## 2019-11-22 DIAGNOSIS — I63312 Cerebral infarction due to thrombosis of left middle cerebral artery: Secondary | ICD-10-CM | POA: Diagnosis not present

## 2019-11-22 DIAGNOSIS — Z7401 Bed confinement status: Secondary | ICD-10-CM | POA: Diagnosis not present

## 2019-11-22 DIAGNOSIS — M2550 Pain in unspecified joint: Secondary | ICD-10-CM | POA: Diagnosis not present

## 2019-11-22 DIAGNOSIS — R5381 Other malaise: Secondary | ICD-10-CM | POA: Diagnosis not present

## 2019-11-22 LAB — CBC
HCT: 35 % — ABNORMAL LOW (ref 39.0–52.0)
Hemoglobin: 11.6 g/dL — ABNORMAL LOW (ref 13.0–17.0)
MCH: 32 pg (ref 26.0–34.0)
MCHC: 33.1 g/dL (ref 30.0–36.0)
MCV: 96.4 fL (ref 80.0–100.0)
Platelets: 464 10*3/uL — ABNORMAL HIGH (ref 150–400)
RBC: 3.63 MIL/uL — ABNORMAL LOW (ref 4.22–5.81)
RDW: 13.8 % (ref 11.5–15.5)
WBC: 21 10*3/uL — ABNORMAL HIGH (ref 4.0–10.5)
nRBC: 0 % (ref 0.0–0.2)

## 2019-11-22 LAB — COMPREHENSIVE METABOLIC PANEL
ALT: 21 U/L (ref 0–44)
AST: 30 U/L (ref 15–41)
Albumin: 3.8 g/dL (ref 3.5–5.0)
Alkaline Phosphatase: 154 U/L — ABNORMAL HIGH (ref 38–126)
Anion gap: 18 — ABNORMAL HIGH (ref 5–15)
BUN: 28 mg/dL — ABNORMAL HIGH (ref 6–20)
CO2: 28 mmol/L (ref 22–32)
Calcium: 10.2 mg/dL (ref 8.9–10.3)
Chloride: 91 mmol/L — ABNORMAL LOW (ref 98–111)
Creatinine, Ser: 0.88 mg/dL (ref 0.61–1.24)
GFR calc Af Amer: >60 mL/min (ref >60)
GFR calc non Af Amer: >60 mL/min (ref >60)
Glucose, Bld: 136 mg/dL — ABNORMAL HIGH (ref 70–99)
Potassium: 4.2 mmol/L (ref 3.5–5.1)
Sodium: 137 mmol/L (ref 135–145)
Total Bilirubin: 0.7 mg/dL (ref 0.3–1.2)
Total Protein: 7.8 g/dL (ref 6.5–8.1)

## 2019-11-22 LAB — GLUCOSE, CAPILLARY
Glucose-Capillary: 426 mg/dL — ABNORMAL HIGH (ref 70–99)
Glucose-Capillary: 132 mg/dL — ABNORMAL HIGH (ref 70–99)
Glucose-Capillary: 136 mg/dL — ABNORMAL HIGH (ref 70–99)
Glucose-Capillary: 217 mg/dL — ABNORMAL HIGH (ref 70–99)

## 2019-11-22 LAB — GLUCOSE, RANDOM
Glucose, Bld: 349 mg/dL — ABNORMAL HIGH (ref 70–99)
Glucose, Bld: 201 mg/dL — ABNORMAL HIGH (ref 70–99)

## 2019-11-22 MED ORDER — HYDROCODONE-ACETAMINOPHEN 7.5-325 MG PO TABS
1.0000 | ORAL_TABLET | Freq: Four times a day (QID) | ORAL | Status: DC | PRN
  Administered 2019-11-22: 1 via ORAL
  Filled 2019-11-22: qty 1

## 2019-11-22 MED ORDER — INSULIN ASPART 100 UNIT/ML ~~LOC~~ SOLN
2.0000 [IU] | Freq: Three times a day (TID) | SUBCUTANEOUS | Status: DC
  Filled 2019-11-22 (×2): qty 1

## 2019-11-22 NOTE — TOC Transition Note (Signed)
Transition of Care [facility]) - CM/SW Discharge Note   Patient Details  Name: [name] MRN: [id number] Date of Birth: 1971-09-04  [facility]) CM/SW Contact:  Su Hilt, RN Phone Number: 11/22/2019, 10:24 AM   Clinical Narrative:    Patient has 24/7 care at home, he does not have additional needs, Has DME that he needs at home EMS to transport back home         Patient Goals and CMS Choice        Discharge Placement                       Discharge Plan and Services                                     Social Determinants of Health (SDOH) Interventions     Readmission Risk Interventions No flowsheet data found.

## 2019-11-22 NOTE — Progress Notes (Signed)
Inpatient Diabetes Program Recommendations  AACE/ADA: New Consensus Statement on Inpatient Glycemic Control   Target Ranges:  Prepandial:   less than 140 mg/dL      Peak postprandial:   less than 180 mg/dL (1-2 hours)      Critically ill patients:  140 - 180 mg/dL   Results for [name], [name] (MRN [id number]) as of 11/22/2019 08:58  Ref. Range 11/21/2019 06:52 11/21/2019 13:47 11/21/2019 20:45 11/21/2019 22:53 11/22/19 00:02 11/22/2019 01:21 11/22/19 2:12 11/22/2019 07:41  Glucose-Capillary Latest Ref Range: 70 - 99 mg/dL 167 (H) 204 (H)  Novolog 3 units 451 (H)     Levemir 12 units   Novolog 20 units 426 (H)   Novoog 15 units  132 (H)   Review of Glycemic Control  Diabetes history: DM1 Outpatient Diabetes medications: Levemir 29 units QHS, Humalog 1-6 units TID with meals Current orders for Inpatient glycemic control: Levemir 12 units QHS, Novolog 0-15 units Q4H, Novolog 2 units TID with meals for meal coverage  Inpatient Diabetes Program Recommendations:   Insulin-Basal: Please consider increasing Levemir to 15 units QHS.  Insulin-Correction: Please consider decreasing Novolog correction to Novolog 0-6 units TID with meals and Novolog 0-5 units QHS.  NOTE: Patient has DM1 and this is 5th admission over the past month (was just discharged on 11/09/19) and is from home. Initial glucose 165 mg/dl and patient received Decadron 6 mg at 11:20 am on 11/21/19 which contributed to noted hyperglycemia.   Thanks, Barnie Alderman, RN, MSN, CDE Diabetes Coordinator Inpatient Diabetes Program [phone] (Team Pager from 8am to 5pm)

## 2019-11-22 NOTE — Plan of Care (Signed)
Reviewed with pt of Insulin protocol. Medication management. Pdowless,rn

## 2019-11-22 NOTE — Telephone Encounter (Signed)
[facility] called to set up a 1 week hospital follow up. Unable to schedule, no appts available.

## 2019-11-22 NOTE — Progress Notes (Signed)
Pt ready for discharge home today per MD. Patient assessment unchanged from this morning. Reviewed discharge instructions with pt; all questions answered and pt verbalized understanding. PIV removed, VSS. EMS set up for transportation home.  [name]

## 2019-11-22 NOTE — Telephone Encounter (Signed)
Patient to be discharged 11/22/19. Will follow as appropriate.

## 2019-11-22 NOTE — Telephone Encounter (Signed)
The pt is returning [place] phone call. I told him Raquel Sarna will give him a call back.

## 2019-11-22 NOTE — Telephone Encounter (Signed)
Spoke with patient. He denies any dizziness or syncopal episodes. Many pause episodes have been nocturnal. Advised to call the direct Device [facility] number going forward if he experiences any presyncopal/syncopal episodes. Pt verbalizes understanding. Routed to Dr. Caryl Comes for recommendations regarding reprogramming--consider turning pause/brady detection off if non-actionable. Pt has f/u with Dr. Caryl Comes on 02/08/20.   Educated patient and aide about home monitor. Explained manual transmissions are not necessary unless we call to ask for transmissions, or if sending in for symptoms. Pt has not had a wound check--advised to remove steri-strips while on the phone this evening. Pt and aide report incision is well-healed, deny any signs/symptoms of infection. Aware to call if any issues develop. No further questions at this time.

## 2019-11-22 NOTE — Discharge Instructions (Signed)
Acute Respiratory Failure, Adult ° °Acute respiratory failure occurs when there is not enough oxygen passing from your lungs to your body. When this happens, your lungs have trouble removing carbon dioxide from the blood. This causes your blood oxygen level to drop too low as carbon dioxide builds up. °Acute respiratory failure is a medical emergency. It can develop quickly, but it is temporary if treated promptly. Your lung capacity, or how much air your lungs can hold, may improve with time, exercise, and treatment. °What are the causes? °There are many possible causes of acute respiratory failure, including: °· Lung injury. °· Chest injury or damage to the ribs or tissues near the lungs. °· Lung conditions that affect the flow of air and blood into and out of the lungs, such as pneumonia, acute respiratory distress syndrome, and cystic fibrosis. °· Medical conditions, such as strokes or spinal cord injuries, that affect the muscles and nerves that control breathing. °· Blood infection (sepsis). °· Inflammation of the pancreas (pancreatitis). °· A blood clot in the lungs (pulmonary embolism). °· A large-volume blood transfusion. °· Burns. °· Near-drowning. °· Seizure. °· Smoke inhalation. °· Reaction to medicines. °· Alcohol or drug overdose. °What increases the risk? °This condition is more likely to develop in people who have: °· A blocked airway. °· Asthma. °· A condition or disease that damages or weakens the muscles, nerves, bones, or tissues that are involved in breathing. °· A serious infection. °· A health problem that blocks the unconscious reflex that is involved in breathing, such as hypothyroidism or sleep apnea. °· A lung injury or trauma. °What are the signs or symptoms? °Trouble breathing is the main symptom of acute respiratory failure. Symptoms may also include: °· Rapid breathing. °· Restlessness or anxiety. °· Skin, lips, or fingernails that appear blue (cyanosis). °· Rapid heart  rate. °· Abnormal heart rhythms (arrhythmias). °· Confusion or changes in behavior. °· Tiredness or loss of energy. °· Feeling sleepy or having a loss of consciousness. °How is this diagnosed? °Your health care provider can diagnose acute respiratory failure with a medical history and physical exam. During the exam, your health care provider will listen to your heart and check for crackling or wheezing sounds in your lungs. Your may also have tests to confirm the diagnosis and determine what is causing respiratory failure. These tests may include: °· Measuring the amount of oxygen in your blood (pulse oximetry). The measurement comes from a small device that is placed on your finger, earlobe, or toe. °· Other blood tests to measure blood gases and to look for signs of infection. °· Sampling your cerebral spinal fluid or tracheal fluid to check for infections. °· Chest X-ray to look for fluid in spaces that should be filled with air. °· Electrocardiogram (ECG) to look at the heart's electrical activity. °How is this treated? °Treatment for this condition usually takes places in a hospital intensive care unit (ICU). Treatment depends on what is causing the condition. It may include one or more treatments until your symptoms improve. Treatment may include: °· Supplemental oxygen. Extra oxygen is given through a tube in the nose, a face mask, or a hood. °· A device such as a continuous positive airway pressure (CPAP) or bi-level positive airway pressure (BiPAP or BPAP) machine. This treatment uses mild air pressure to keep the airways open. A mask or other device will be placed over your nose or mouth. A tube that is connected to a motor will deliver oxygen through the   mask. °· Ventilator. This treatment helps move air into and out of the lungs. This may be done with a bag and mask or a machine. For this treatment, a tube is placed in your windpipe (trachea) so air and oxygen can flow to the lungs. °· Extracorporeal  membrane oxygenation (ECMO). This treatment temporarily takes over the function of the heart and lungs, supplying oxygen and removing carbon dioxide. ECMO gives the lungs a chance to recover. It may be used if a ventilator is not effective. °· Tracheostomy. This is a procedure that creates a hole in the neck to insert a breathing tube. °· Receiving fluids and medicines. °· Rocking the bed to help breathing. °Follow these instructions at home: °· Take over-the-counter and prescription medicines only as told by your health care provider. °· Return to normal activities as told by your health care provider. Ask your health care provider what activities are safe for you. °· Keep all follow-up visits as told by your health care provider. This is important. °How is this prevented? °Treating infections and medical conditions that may lead to acute respiratory failure can help prevent the condition from developing. °Contact a health care provider if: °· You have a fever. °· Your symptoms do not improve or they get worse. °Get help right away if: °· You are having trouble breathing. °· You lose consciousness. °· Your have cyanosis or turn blue. °· You develop a rapid heart rate. °· You are confused. °These symptoms may represent a serious problem that is an emergency. Do not wait to see if the symptoms will go away. Get medical help right away. Call your local emergency services (911 in the [place] Do not drive yourself to the hospital. °This information is not intended to replace advice given to you by your health care provider. Make sure you discuss any questions you have with your health care provider. °Document Released: 11/28/2013 Document Revised: 11/05/2017 Document Reviewed: 06/10/2016 °Elsevier Patient Education © 2020 [employer] ° °

## 2019-11-23 ENCOUNTER — Encounter: Payer: Self-pay | Admitting: Internal Medicine

## 2019-11-23 ENCOUNTER — Telehealth: Payer: Self-pay

## 2019-11-23 ENCOUNTER — Telehealth: Payer: Self-pay | Admitting: Internal Medicine

## 2019-11-23 NOTE — Telephone Encounter (Signed)
Contacted patient for transitional care management and hospital follow up with PMD. Patient states," I feel great and do not need a follow up at this time". Denies shortness of breath, dizziness, chest pain and all other symptoms. Reports he is at baseline. Hospital follow up declined. Encouraged to call the office with any new or worsening symptoms or if there is anything we can assist with.

## 2019-11-23 NOTE — Telephone Encounter (Signed)
I received notification.  He has diabetes.  Please call him and confirm he is doing well.  Also let him know that given his history of diabetes, it is recommended for him to start a cholesterol medication.  I would like to start him on crestor 77m - evan one time per week, to help reduce risk of coronary artery disease.
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
## 2019-11-26 NOTE — Telephone Encounter (Signed)
His indication is Cryptogenic Stroke  lets turn off brady detect   good idea Raquel Sarna Thanks SK

## 2019-11-27 ENCOUNTER — Encounter: Payer: Self-pay | Admitting: Internal Medicine

## 2019-11-27 DIAGNOSIS — I10 Essential (primary) hypertension: Secondary | ICD-10-CM | POA: Diagnosis not present

## 2019-11-27 DIAGNOSIS — N319 Neuromuscular dysfunction of bladder, unspecified: Secondary | ICD-10-CM | POA: Diagnosis not present

## 2019-11-27 DIAGNOSIS — K219 Gastro-esophageal reflux disease without esophagitis: Secondary | ICD-10-CM | POA: Diagnosis not present

## 2019-11-27 DIAGNOSIS — Z87440 Personal history of urinary (tract) infections: Secondary | ICD-10-CM | POA: Diagnosis not present

## 2019-11-27 DIAGNOSIS — M75102 Unspecified rotator cuff tear or rupture of left shoulder, not specified as traumatic: Secondary | ICD-10-CM | POA: Diagnosis not present

## 2019-11-27 DIAGNOSIS — G904 Autonomic dysreflexia: Secondary | ICD-10-CM | POA: Diagnosis not present

## 2019-11-27 DIAGNOSIS — G8220 Paraplegia, unspecified: Secondary | ICD-10-CM | POA: Diagnosis not present

## 2019-11-27 DIAGNOSIS — E109 Type 1 diabetes mellitus without complications: Secondary | ICD-10-CM | POA: Diagnosis not present

## 2019-11-27 DIAGNOSIS — D439 Neoplasm of uncertain behavior of central nervous system, unspecified: Secondary | ICD-10-CM | POA: Diagnosis not present

## 2019-11-27 NOTE — Telephone Encounter (Signed)
Confirmed nothing acute going on (SOB, chest pain, etc.) Per Dr Nicki Reaper, scheduled for HFU tomorrow at 12.Need to discuss lasix. Pt had it in the hospital but did not get sent home with rx.

## 2019-11-27 NOTE — Telephone Encounter (Signed)
Pause/brady alerts off in [facility] for now. Will reprogram pause/brady detection off at 02/2020 f/u with Dr. Caryl Comes.

## 2019-11-27 NOTE — Telephone Encounter (Signed)
Will discuss with pt tomorrow.

## 2019-11-28 ENCOUNTER — Other Ambulatory Visit: Payer: Self-pay

## 2019-11-28 ENCOUNTER — Encounter: Payer: Self-pay | Admitting: Internal Medicine

## 2019-11-28 ENCOUNTER — Ambulatory Visit (INDEPENDENT_AMBULATORY_CARE_PROVIDER_SITE_OTHER): Payer: Medicare Other | Admitting: Internal Medicine

## 2019-11-28 DIAGNOSIS — I959 Hypotension, unspecified: Secondary | ICD-10-CM

## 2019-11-28 DIAGNOSIS — E1069 Type 1 diabetes mellitus with other specified complication: Secondary | ICD-10-CM

## 2019-11-28 DIAGNOSIS — R0602 Shortness of breath: Secondary | ICD-10-CM

## 2019-11-28 DIAGNOSIS — D62 Acute posthemorrhagic anemia: Secondary | ICD-10-CM

## 2019-11-28 DIAGNOSIS — I63443 Cerebral infarction due to embolism of bilateral cerebellar arteries: Secondary | ICD-10-CM

## 2019-11-28 DIAGNOSIS — R609 Edema, unspecified: Secondary | ICD-10-CM | POA: Diagnosis not present

## 2019-11-28 DIAGNOSIS — U070 Vaping-related disorder: Secondary | ICD-10-CM | POA: Diagnosis not present

## 2019-11-28 DIAGNOSIS — F1010 Alcohol abuse, uncomplicated: Secondary | ICD-10-CM | POA: Diagnosis not present

## 2019-11-28 DIAGNOSIS — G8220 Paraplegia, unspecified: Secondary | ICD-10-CM

## 2019-11-28 DIAGNOSIS — E871 Hypo-osmolality and hyponatremia: Secondary | ICD-10-CM
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
## 2019-11-29 ENCOUNTER — Telehealth: Payer: Self-pay | Admitting: Cardiovascular Disease

## 2019-11-29 ENCOUNTER — Other Ambulatory Visit: Payer: Self-pay | Admitting: Internal Medicine

## 2019-11-29 ENCOUNTER — Telehealth: Payer: Self-pay | Admitting: Internal Medicine

## 2019-11-29 DIAGNOSIS — E109 Type 1 diabetes mellitus without complications: Secondary | ICD-10-CM | POA: Diagnosis not present

## 2019-11-29 DIAGNOSIS — I10 Essential (primary) hypertension: Secondary | ICD-10-CM | POA: Diagnosis not present

## 2019-11-29 DIAGNOSIS — G8220 Paraplegia, unspecified: Secondary | ICD-10-CM | POA: Diagnosis not present

## 2019-11-29 DIAGNOSIS — N319 Neuromuscular dysfunction of bladder, unspecified: Secondary | ICD-10-CM | POA: Diagnosis not present

## 2019-11-29 DIAGNOSIS — K219 Gastro-esophageal reflux disease without esophagitis: Secondary | ICD-10-CM | POA: Diagnosis not present

## 2019-11-29 DIAGNOSIS — Z87440 Personal history of urinary (tract) infections: Secondary | ICD-10-CM | POA: Diagnosis not present

## 2019-11-29 DIAGNOSIS — G904 Autonomic dysreflexia: Secondary | ICD-10-CM | POA: Diagnosis not present

## 2019-11-29 DIAGNOSIS — D439 Neoplasm of uncertain behavior of central nervous system, unspecified: Secondary | ICD-10-CM | POA: Diagnosis not present

## 2019-11-29 DIAGNOSIS — M75102 Unspecified rotator cuff tear or rupture of left shoulder, not specified as traumatic: Secondary | ICD-10-CM | POA: Diagnosis not present

## 2019-11-29 MED ORDER — ROSUVASTATIN CALCIUM 5 MG PO TABS: ORAL_TABLET | ORAL | 0 refills | Status: DC

## 2019-11-29 NOTE — Telephone Encounter (Signed)
Called patient. He thinks he has a UTI. Not ear infection. Patient stated that he is having burning with urination- did not think to mention yesterday during visit. NO other acute symptoms except for the symptoms he discussed with you yesterday. He called Dr Rogers Blocker to see if he could have someone give him a cup to collect urine and bring it back to office. Was told he could not and Dr Roland Rack would have to order/collect the urine. Pt would like to know if it is ok for someone to come pick up sterile cup and obtain a urine since he was just seen yesterday.   Also, there is another message on this pt wanting to know if he would be agreeable to start taking cholesterol medication crestor 5 mg once a week, he is agreeable and I have sent in.

## 2019-11-29 NOTE — Telephone Encounter (Signed)
Dr. Nicki Reaper wants to touch base with dr. Rockey Situ   Re: Recent Admission and ongoing issues.    She would like a direct call.

## 2019-11-29 NOTE — Telephone Encounter (Signed)
Pt agreeable  

## 2019-11-29 NOTE — Telephone Encounter (Signed)
To Dr. Rockey Situ- secure chat also sent to MD.

## 2019-11-29 NOTE — Telephone Encounter (Signed)
Pt called and he feels like he has a ear infection. Patient is asking for a phone call [phone]

## 2019-11-29 NOTE — Telephone Encounter (Signed)
Left message for PT at [facility] to call me back

## 2019-11-29 NOTE — Telephone Encounter (Signed)
Still unable to reach [facility]. Patient has been checking bp today was 200/120, 160/100, 80/50. While on the phone bp 158/100. Patient stated that he is not having any symptoms but he took something to bring it down (could not tell me what) BP dropped to 80/50 and he took a sodium tablet to bring pressure up. Pressure now is 158/100. He thinks that his pressure it running high because the chair he is having to sit in and not being able to adjust himself to be comfortable since he had surgery.

## 2019-11-29 NOTE — Progress Notes (Signed)
rx sent in for crestor.

## 2019-11-29 NOTE — Telephone Encounter (Signed)
Patient aware. Checked BP and gave reading while on the phone: 113/78. Advised to let us know if he needs anything,

## 2019-11-29 NOTE — Telephone Encounter (Signed)
Noted.  Have him to continue to spot check his pressure.  Let him know that I spoke to Dr Yves Dill.  He did state and then he recommended home health to get urine.  Also, Dr Yves Dill offered to do a virtual visit with him.  He can call back if agreeable to do this and then they can work out a plan for treatment.

## 2019-11-29 NOTE — Telephone Encounter (Signed)
rx sent in for crestor.

## 2019-11-29 NOTE — Telephone Encounter (Signed)
I spoke to Dr Yves Dill given he has been following [name] for years and given his history with recurring bladder infections, history of multi drug resistant infections, etc.  Discussed [name]'s current situation and inability to drive at this time.  He stated he would be agreeable for home health to collect a urine.  They could contact him and he could give orders.  [name] is currently getting physical therapy from [facility].  Please contact them and see if they would be agreeable to receiving order from Dr Eliberto Ivory regarding - checking a urine.

## 2019-11-30 DIAGNOSIS — M75102 Unspecified rotator cuff tear or rupture of left shoulder, not specified as traumatic: Secondary | ICD-10-CM | POA: Diagnosis not present

## 2019-11-30 DIAGNOSIS — Z87440 Personal history of urinary (tract) infections: Secondary | ICD-10-CM | POA: Diagnosis not present

## 2019-11-30 DIAGNOSIS — E109 Type 1 diabetes mellitus without complications: Secondary | ICD-10-CM | POA: Diagnosis not present

## 2019-11-30 DIAGNOSIS — G8220 Paraplegia, unspecified: Secondary | ICD-10-CM | POA: Diagnosis not present

## 2019-11-30 DIAGNOSIS — N319 Neuromuscular dysfunction of bladder, unspecified: Secondary | ICD-10-CM | POA: Diagnosis not present

## 2019-11-30 DIAGNOSIS — I10 Essential (primary) hypertension: Secondary | ICD-10-CM | POA: Diagnosis not present

## 2019-11-30 DIAGNOSIS — G904 Autonomic dysreflexia: Secondary | ICD-10-CM | POA: Diagnosis not present

## 2019-11-30 DIAGNOSIS — D439 Neoplasm of uncertain behavior of central nervous system, unspecified: Secondary | ICD-10-CM | POA: Diagnosis not present

## 2019-11-30 DIAGNOSIS — K219 Gastro-esophageal reflux disease without esophagitis: Secondary | ICD-10-CM | POA: Diagnosis not present

## 2019-12-01 ENCOUNTER — Encounter: Payer: Self-pay | Admitting: Internal Medicine

## 2019-12-01 ENCOUNTER — Telehealth: Payer: Self-pay | Admitting: Internal Medicine

## 2019-12-01 NOTE — Addendum Note (Signed)
Addended by: Alisa Graff on: 12/01/2019 11:30 PM   Modules accepted: Orders

## 2019-12-01 NOTE — Assessment & Plan Note (Signed)
Has seen GI.  Previous EGD 04/2018 - chronic gastritis and reflux esophagitis.  Recent hgb 11.6.  Follow [facility].

## 2019-12-01 NOTE — Assessment & Plan Note (Signed)
Recently admitted with sob.  CT as outlined.  Unclear etiology.  Concern was - pulmonary edema.  Was diuresed in hospital.  Notices some sob with lying flat.  No increased cough or congestion.  Recent ECHO with normal EF.  Needs further pulmonary evaluation and testing.  Also, f/u with cardiology discussed.  Given low pressure, hold on lasix at this time.

## 2019-12-01 NOTE — Assessment & Plan Note (Signed)
Has had issues with low blood pressure.  Previous concern over autonomic dysfunction.  Has had varying blood pressures.  Discussed my concern and hesitancy regarding prescribing lasix as outpt.  Has had blood pressure dropping as low as 70 systolic.  Hold lasix at this time.

## 2019-12-01 NOTE — Assessment & Plan Note (Signed)
Recent discharge sodium wnl.

## 2019-12-01 NOTE — Assessment & Plan Note (Signed)
Recent admission as outlined.  Concern regarding possible lung injury secondary to vaping.  CT as outlined.  Needs further pulmonary evaluation to fully determine etiology of his sob.

## 2019-12-01 NOTE — Assessment & Plan Note (Addendum)
Has had issues previously with lower extremity swelling.  Some improvement in his feet and lower legs with elevation.  Describes swelling now in upper legs and abdomen.  Unclear etiology.  Was diuresed in hospital (where he could be monitored).  Did report some improvement in his breathing.  Discussed possible etiologies for his swelling.  He has had issues with his blood pressure dropping.  Discussed my concern with placing him on lasix and blood pressure dropping.  Discussed continued leg elevation.  Also discussed recent scan which revealed a pancreatic mass.  Needs MRI abdomen to further evaluate.  Also discussed the need to decrease alcohol intake.  Recent sodium and kidney function wnl.  Discussed f/u with cardiology.  Recent ER wnl.  Hold diuretics for now.

## 2019-12-01 NOTE — Assessment & Plan Note (Addendum)
Limited in transfers now given recent shoulder surgery.  Has caretaker.  Receiving PT at home.  Contact services for help with transportation.

## 2019-12-01 NOTE — Telephone Encounter (Signed)
Both Trisha and I will not be in the office this week.  You had contacted someone about helping with transportation.  He is paraplegic.  Has had recent shoulder surgery.  Unable to transfer and unable to use his manual wheelchair.  Needs transportation where he can drive up in a van with motorized wheelchair.  I need to schedule him for f/u with cardiology and pulmonary.  I am going to go ahead and see about scheduling an appt and hopefully will be able to get transportation scheduled.

## 2019-12-01 NOTE — Assessment & Plan Note (Signed)
Recently admitted with CVA.  Is taking aspirin daily.  No recurring problems.  Follow.

## 2019-12-01 NOTE — Assessment & Plan Note (Signed)
No low sugars (has sensor).  No extremely high sugars since being home.  Sugars better.  Follow.

## 2019-12-01 NOTE — Assessment & Plan Note (Signed)
Discussed the need to quit drinking.  Discussed helping him with this.

## 2019-12-01 NOTE — Telephone Encounter (Signed)
Called pt and discussed his persistent sob - at night.  Discussed the need for further evaluation.  Discussed concern regarding given lasix with his low pressure as outlined.  Needs f/u with pulmonary and cardiology.  ER evaluation recommended with worsening sob and further w/up.

## 2019-12-05 ENCOUNTER — Telehealth (INDEPENDENT_AMBULATORY_CARE_PROVIDER_SITE_OTHER): Payer: Medicare Other | Admitting: Family

## 2019-12-05 ENCOUNTER — Telehealth: Payer: Self-pay | Admitting: Internal Medicine

## 2019-12-05 ENCOUNTER — Encounter: Payer: Self-pay | Admitting: Family

## 2019-12-05 ENCOUNTER — Encounter: Payer: Self-pay | Admitting: Internal Medicine

## 2019-12-05 ENCOUNTER — Other Ambulatory Visit: Payer: Self-pay

## 2019-12-05 VITALS — BP 126/79 | HR 58 | Ht 72.0 in

## 2019-12-05 DIAGNOSIS — E109 Type 1 diabetes mellitus without complications: Secondary | ICD-10-CM | POA: Diagnosis not present

## 2019-12-05 DIAGNOSIS — R0601 Orthopnea: Secondary | ICD-10-CM

## 2019-12-05 DIAGNOSIS — R600 Localized edema: Secondary | ICD-10-CM

## 2019-12-05 DIAGNOSIS — N319 Neuromuscular dysfunction of bladder, unspecified: Secondary | ICD-10-CM | POA: Diagnosis not present

## 2019-12-05 DIAGNOSIS — K219 Gastro-esophageal reflux disease without esophagitis: Secondary | ICD-10-CM | POA: Diagnosis not present

## 2019-12-05 DIAGNOSIS — I5031 Acute diastolic (congestive) heart failure: Secondary | ICD-10-CM

## 2019-12-05 DIAGNOSIS — G904 Autonomic dysreflexia: Secondary | ICD-10-CM

## 2019-12-05 DIAGNOSIS — Z87440 Personal history of urinary (tract) infections: Secondary | ICD-10-CM | POA: Diagnosis not present

## 2019-12-05 DIAGNOSIS — I10 Essential (primary) hypertension: Secondary | ICD-10-CM | POA: Diagnosis not present

## 2019-12-05 DIAGNOSIS — D439 Neoplasm of uncertain behavior of central nervous system, unspecified: Secondary | ICD-10-CM | POA: Diagnosis not present

## 2019-12-05 DIAGNOSIS — G8220 Paraplegia, unspecified: Secondary | ICD-10-CM | POA: Diagnosis not present

## 2019-12-05 DIAGNOSIS — M75102 Unspecified rotator cuff tear or rupture of left shoulder, not specified as traumatic: Secondary | ICD-10-CM | POA: Diagnosis not present

## 2019-12-05 MED ORDER — FUROSEMIDE 20 MG PO TABS: 20.0000 mg | ORAL_TABLET | Freq: Every day | ORAL | 0 refills | Status: DC
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
## 2019-12-05 NOTE — Patient Instructions (Signed)
Medication Instructions:  Your physician has recommended you make the following change in your medication:   START Lasix 62m (one tablet) daily in the morning This is a diuretic or "fluid pill" to help get rid of excess fluid.  USE your Midodrine as needed for systolic blood pressure (the top number) less than 100. You may take this medication up to three times per day at breakfast, lunch, and dinner.  *If you need a refill on your cardiac medications before your next appointment, please call your pharmacy*  Lab Work: No lab work today.  If you have labs (blood work) drawn today and your tests are completely normal, you will receive your results only by: .Marland KitchenMyChart Message (if you have MyChart) OR . A paper copy in the mail If you have any lab test that is abnormal or we need to change your treatment, we will call you to review the results.  Testing/Procedures: None ordered today.  Follow-Up: [facility] you and your health needs are our priority.  As part of our continuing mission to provide you with exceptional heart care, we have created designated Provider Care Teams.  These Care Teams include your primary Cardiologist (physician) and Advanced Practice Providers (APPs -  Physician Assistants and Nurse Practitioners) who all work together to provide you with the care you need, when you need it.  Your next appointment:   1 week(s)  The format for your next appointment:   Either In Person or Virtual  Provider:    You may see TIda Rogue MD or one of the following Advanced Practice Providers on your designated Care Team:    CMurray Hodgkins NP  RChristell Faith PA-C  JMarrianne Mood PA-C   Other Instructions   Elevate your legs when sitting.  Recommend compression socks.   Check your blood pressure every morning before the Lasix. Take the Midodrine to raise your blood pressure if needed.  Let u[place]ore[place]now if you are having lightheadedness, dizziness, chest pain,  or worsening shortness of breath.   Recommend cutting back your number of beers each evening as these can contribute to your swelling.

## 2019-12-05 NOTE — Telephone Encounter (Signed)
[name] has [facility] set up for physical therapy.  He saw cardiology today and they messaged me about getting him set up with [facility] at home heart failure management program.  Do you mind contacting [facility] (or having someone at the office contact) to see if we can get this program set up for [name].  Let me know if I need to do anything.  Thank you for your help.

## 2019-12-05 NOTE — Telephone Encounter (Signed)
Please advise would you want labs looks like patient did a telehealth visit today.

## 2019-12-05 NOTE — Telephone Encounter (Signed)
Missed the call, sorry, out this week How can I help?

## 2019-12-05 NOTE — Telephone Encounter (Signed)
Need met b and liver panel - dx R60.9 and cbc - dx D64.9.  There is another phone message on this patient about getting [facility] to set up [facility] heart failure management program - through [facility].  He is currently receiving physical therapy through [facility].  Thanks

## 2019-12-05 NOTE — Telephone Encounter (Signed)
Just received your message.  I am out of the office this week. Your nurse practitioner saw him in the office today and started him on lasix.  She plans to see him again in one week.  Thank you.    Einar Pheasant

## 2019-12-06 ENCOUNTER — Telehealth: Payer: Self-pay | Admitting: Internal Medicine

## 2019-12-06 ENCOUNTER — Telehealth: Payer: Self-pay

## 2019-12-06 NOTE — Telephone Encounter (Signed)
I reviewed staff message.  Thank you for contacting them.  Can you see if they can draw next week, instead of 12/07/19.  Cardiology had messaged me and wanted labs next week (week of 12/11/19).   I sent you a staff message also.  Thanks.

## 2019-12-06 NOTE — Telephone Encounter (Signed)
Labs ordered see staff message.

## 2019-12-06 NOTE — Telephone Encounter (Signed)
-----   Message from Loel Dubonnet, NP sent at 12/06/2019  7:43 AM EST ----- Regarding: RE: Labs with [facility]? Thank you! Let me know if you need anything of me.  So the [facility] [place] failure program (for a quick overview). Is run by a grant. It was started during the pandemic for the purpose of monitoring HF patients carefully. [facility] nurses or PTs go out to the home, can do a [employer] reading, can do med reconciliation, heart failure teaching, assessment, etc. Joen Laura, RN has been my contact there - she is wonderful. Not sure if any of your patients would benefit, but that's the short overview.   Best, Loel Dubonnet, NP ----- Message ----- From: Einar Pheasant, MD Sent: 12/05/2019  11:40 PM EST To: Loel Dubonnet, NP Subject: RE: Labs with [facility]?                         Right now [name] just has physical therapy through [facility].  I have asked my nurse to contact [facility] to see if they can draw these labs.  Thank you again.   Einar Pheasant ----- Message ----- From: Loel Dubonnet, NP Sent: 12/05/2019   3:59 PM EST To: Einar Pheasant, MD Subject: Labs with [facility]?                             Hello!  Figured I would send this as staff message rather than chat so it doesn't disappear into cyberspace.  Mr. Mcconathy endorsed orthopnea and LE edema x2 weeks. He does drink 4-6 cans of beer every night - encouraged him to decrease. Recommended compression stockings (he already ordered some) and elevating lower extremities.   Starting Lasix 47m daily. Take his Midodrine for SBP <100 up to three times per day with breakfast, lunch, dinner - he has not been taking it at all.   He has virtual f/u with me next Thursday.  Do you have the [facility] heart failure management program following him? Or [facility] home [facility]? I saw some notes about [facility] coming to see him. Would like to get a BMET/ProBNp in about a week if possible.   Best, CLoel Dubonnet  NP

## 2019-12-06 NOTE — Telephone Encounter (Signed)
Noted and advised PCP.

## 2019-12-06 NOTE — Telephone Encounter (Signed)
Copied from [place] [id number]. Topic: Referral - Status >> Dec 06, 2019 99:80 PM Simone Curia D wrote: 69/99/6722 Spoke with patient about transportation resources. Ambrose Mantle [phone]

## 2019-12-06 NOTE — Telephone Encounter (Signed)
Copied from [place] [phone]. Topic: Referral - Status >> Dec 06, 2019 93:01 PM Simone Curia D wrote: 23/79/9094 Spoke with patient  about transportation resources. Ambrose Mantle [phone],  Just FYI that [facility] has contacted patient concerning transportation services.

## 2019-12-06 NOTE — Telephone Encounter (Signed)
Left message with [facility] RN case manager to return cal concerning getting patient into the Heart failure program.

## 2019-12-06 NOTE — Telephone Encounter (Signed)
Spoke with Colletta [place] case manager she is going to pull the note from cardiology and get patient started on the home CHF program.

## 2019-12-07 DIAGNOSIS — I509 Heart failure, unspecified: Secondary | ICD-10-CM | POA: Diagnosis not present

## 2019-12-07 DIAGNOSIS — G8220 Paraplegia, unspecified: Secondary | ICD-10-CM | POA: Diagnosis not present

## 2019-12-07 DIAGNOSIS — N319 Neuromuscular dysfunction of bladder, unspecified: Secondary | ICD-10-CM | POA: Diagnosis not present

## 2019-12-07 DIAGNOSIS — D439 Neoplasm of uncertain behavior of central nervous system, unspecified: Secondary | ICD-10-CM | POA: Diagnosis not present

## 2019-12-07 DIAGNOSIS — I10 Essential (primary) hypertension: Secondary | ICD-10-CM | POA: Diagnosis not present

## 2019-12-07 DIAGNOSIS — E109 Type 1 diabetes mellitus without complications: Secondary | ICD-10-CM | POA: Diagnosis not present

## 2019-12-07 DIAGNOSIS — Z87440 Personal history of urinary (tract) infections: Secondary | ICD-10-CM | POA: Diagnosis not present

## 2019-12-07 DIAGNOSIS — G904 Autonomic dysreflexia: Secondary | ICD-10-CM | POA: Diagnosis not present

## 2019-12-07 DIAGNOSIS — K219 Gastro-esophageal reflux disease without esophagitis: Secondary | ICD-10-CM | POA: Diagnosis not present

## 2019-12-07 DIAGNOSIS — M75102 Unspecified rotator cuff tear or rupture of left shoulder, not specified as traumatic: Secondary | ICD-10-CM | POA: Diagnosis not present

## 2019-12-07 NOTE — Telephone Encounter (Signed)
Sorry was to late labs have bee drawn.

## 2019-12-08 LAB — HEPATIC FUNCTION PANEL
ALT: 15 (ref 10–40)
AST: 20 (ref 14–40)
Alkaline Phosphatase: 202 — AB (ref 25–125)
Bilirubin, Direct: 0.09 (ref 0.01–0.4)
Bilirubin, Total: 0.2

## 2019-12-08 LAB — BASIC METABOLIC PANEL
BUN: 16 (ref 4–21)
CO2: 22 (ref 13–22)
Chloride: 96 — AB (ref 99–108)
Creatinine: 0.7 (ref 0.6–1.3)
Glucose: 181
Potassium: 4.4 (ref 3.4–5.3)
Sodium: 137 (ref 137–147)

## 2019-12-08 LAB — CBC AND DIFFERENTIAL
HCT: 27 — AB (ref 41–53)
Hemoglobin: 8.9 — AB (ref 13.5–17.5)
Neutrophils Absolute: 9
Platelets: 349 (ref 150–399)
WBC: 10.5

## 2019-12-08 LAB — COMPREHENSIVE METABOLIC PANEL
Albumin: 3 — AB (ref 3.5–5.0)
Calcium: 8.8 (ref 8.7–10.7)
GFR calc Af Amer: 131
GFR calc non Af Amer: 114

## 2019-12-08 LAB — CBC: RBC: 3 — AB (ref 3.87–5.11)

## 2019-12-10 DIAGNOSIS — G8220 Paraplegia, unspecified: Secondary | ICD-10-CM | POA: Diagnosis not present

## 2019-12-11 DIAGNOSIS — G8220 Paraplegia, unspecified: Secondary | ICD-10-CM | POA: Diagnosis not present

## 2019-12-11 DIAGNOSIS — L89153 Pressure ulcer of sacral region, stage 3: Secondary | ICD-10-CM | POA: Diagnosis not present

## 2019-12-12 ENCOUNTER — Ambulatory Visit (INDEPENDENT_AMBULATORY_CARE_PROVIDER_SITE_OTHER): Payer: Medicare Other | Admitting: *Deleted

## 2019-12-12 ENCOUNTER — Telehealth: Payer: Self-pay

## 2019-12-12 DIAGNOSIS — R55 Syncope and collapse: Secondary | ICD-10-CM | POA: Diagnosis not present

## 2019-12-12 DIAGNOSIS — N319 Neuromuscular dysfunction of bladder, unspecified: Secondary | ICD-10-CM | POA: Diagnosis not present

## 2019-12-12 DIAGNOSIS — G8220 Paraplegia, unspecified: Secondary | ICD-10-CM | POA: Diagnosis not present

## 2019-12-12 DIAGNOSIS — M75102 Unspecified rotator cuff tear or rupture of left shoulder, not specified as traumatic: Secondary | ICD-10-CM | POA: Diagnosis not present

## 2019-12-12 DIAGNOSIS — K219 Gastro-esophageal reflux disease without esophagitis: Secondary | ICD-10-CM | POA: Diagnosis not present

## 2019-12-12 DIAGNOSIS — I10 Essential (primary) hypertension: Secondary | ICD-10-CM | POA: Diagnosis not present

## 2019-12-12 DIAGNOSIS — E109 Type 1 diabetes mellitus without complications: Secondary | ICD-10-CM | POA: Diagnosis not present

## 2019-12-12 DIAGNOSIS — D439 Neoplasm of uncertain behavior of central nervous system, unspecified: Secondary | ICD-10-CM | POA: Diagnosis not present

## 2019-12-12 DIAGNOSIS — Z87440 Personal history of urinary (tract) infections: Secondary | ICD-10-CM | POA: Diagnosis not present

## 2019-12-12 DIAGNOSIS — G904 Autonomic dysreflexia: Secondary | ICD-10-CM | POA: Diagnosis not present

## 2019-12-12 LAB — CUP PACEART REMOTE DEVICE CHECK
Date Time Interrogation Session: 20210105085904
Implantable Pulse Generator Implant Date: 20201203

## 2019-12-12 NOTE — Telephone Encounter (Signed)
Gave you results for pt. Will notify once reviewed. Confirmed nothing critical. Was drawn 12/31 by [facility].

## 2019-12-13 ENCOUNTER — Telehealth: Payer: Self-pay

## 2019-12-13 DIAGNOSIS — E109 Type 1 diabetes mellitus without complications: Secondary | ICD-10-CM | POA: Diagnosis not present

## 2019-12-13 DIAGNOSIS — Z794 Long term (current) use of insulin: Secondary | ICD-10-CM | POA: Diagnosis not present

## 2019-12-13 NOTE — Telephone Encounter (Signed)
Thank you very much.  I appreciate it

## 2019-12-13 NOTE — Telephone Encounter (Signed)
Left detailed message for patient.

## 2019-12-13 NOTE — Telephone Encounter (Signed)
Copied from [place] [phone]. Topic: Referral - Status >> Dec 13, 2019  9:76 PM Simone Curia D wrote: 06/08/4192 Spoke with patient, he has spoken to his insurance and his transportation is covered for 6 round trips after that he will contact [employer] for transportation.[phone]  He has no other needs at this time. Ambrose Mantle [phone]

## 2019-12-13 NOTE — Telephone Encounter (Signed)
Notify pt that his sodium is nwl.  Kidney function looks good.  Alkaline phosphatase elevated.  Remainder of liver function tests are wnl.  Will follow.  Hgb low.  Need to recheck cbc, ferritin, tibc, % sat, iron, B12 and rbc folate - diagnosis anemia.  Need to check within the next week.

## 2019-12-13 NOTE — Telephone Encounter (Signed)
FYI

## 2019-12-14 ENCOUNTER — Telehealth (INDEPENDENT_AMBULATORY_CARE_PROVIDER_SITE_OTHER): Payer: Medicare Other | Admitting: Family

## 2019-12-14 ENCOUNTER — Encounter: Payer: Self-pay | Admitting: Family

## 2019-12-14 ENCOUNTER — Other Ambulatory Visit: Payer: Self-pay

## 2019-12-14 VITALS — BP 108/58 | HR 66 | Ht 72.0 in | Wt 180.0 lb

## 2019-12-14 DIAGNOSIS — M75102 Unspecified rotator cuff tear or rupture of left shoulder, not specified as traumatic: Secondary | ICD-10-CM | POA: Diagnosis not present

## 2019-12-14 DIAGNOSIS — G8220 Paraplegia, unspecified: Secondary | ICD-10-CM | POA: Diagnosis not present

## 2019-12-14 DIAGNOSIS — I5032 Chronic diastolic (congestive) heart failure: Secondary | ICD-10-CM | POA: Diagnosis not present

## 2019-12-14 DIAGNOSIS — Z87440 Personal history of urinary (tract) infections: Secondary | ICD-10-CM | POA: Diagnosis not present

## 2019-12-14 DIAGNOSIS — K219 Gastro-esophageal reflux disease without esophagitis: Secondary | ICD-10-CM | POA: Diagnosis not present

## 2019-12-14 DIAGNOSIS — D439 Neoplasm of uncertain behavior of central nervous system, unspecified: Secondary | ICD-10-CM | POA: Diagnosis not present

## 2019-12-14 DIAGNOSIS — E109 Type 1 diabetes mellitus without complications: Secondary | ICD-10-CM | POA: Diagnosis not present

## 2019-12-14 DIAGNOSIS — I10 Essential (primary) hypertension: Secondary | ICD-10-CM | POA: Diagnosis not present

## 2019-12-14 DIAGNOSIS — G904 Autonomic dysreflexia: Secondary | ICD-10-CM | POA: Diagnosis not present

## 2019-12-14 DIAGNOSIS — I5031 Acute diastolic (congestive) heart failure: Secondary | ICD-10-CM

## 2019-12-14 DIAGNOSIS — N319 Neuromuscular dysfunction of bladder, unspecified: Secondary | ICD-10-CM | POA: Diagnosis not present

## 2019-12-14 MED ORDER — FUROSEMIDE 20 MG PO TABS: 20.0000 mg | ORAL_TABLET | Freq: Every day | ORAL | 0 refills | Status: DC

## 2019-12-14 NOTE — Patient Instructions (Signed)
Medication Instructions:  Continue Lasix 39m daily.  Continue to use Midodrine as needed for low blood pressures.   *If you need a refill on your cardiac medications before your next appointment, please call your pharmacy*  Lab Work: None ordered today.  If you have labs (blood work) drawn today and your tests are completely normal, you will receive your results only by: .Marland KitchenMyChart Message (if you have MyChart) OR . A paper copy in the mail If you have any lab test that is abnormal or we need to change your treatment, we will call you to review the results.  Testing/Procedures: None ordered today.   Follow-Up: [facility].  As part of our continuing mission to provide you with exceptional heart care, we have created designated Provider Care Teams.  These Care Teams include your primary Cardiologist (physician) and Advanced Practice Providers (APPs -  Physician Assistants and Nurse Practitioners) who all work together to provide you with the care you need, when you need it.  Your next appointment:   March 9th, 2021  The format for your next appointment:   In Person  Provider:   TIda Rogue MD and SVirl Axe MD  Other Instructions   Your heart does not relax as well as it used to. This is called 'diastolic heart failure' and we can see it on your echocardiogram (the ultrasound of your heart). This means you are more likely to hold onto fluid which can lead to swelling and shortness of breath. The Furosemide (Lasix) is a diuretic or "fluid pill" that helps eliminate the extra fluid and keeps you from holding onto even more fluid.    Dr. KCaryl Comesis an electrophysiologist - a heart doctor focusing on the electrical systems of your heart. He will follow your loop recorder. Dr. GRockey Situis a general cardiologist - a heart doctor focusing on the muscular system and vessels of the heart. He will follow your diastolic heart failure and  blood pressure.    Dr. SNicki Reaperhas scheduled you to have more labs. These are to look at your anemia - your hemoglobin was low on your recent labs. Anemia can lead to more fluid retention.

## 2019-12-14 NOTE — Telephone Encounter (Signed)
Verbal orders given to Merry Proud, nurse at [facility]. Labs will be drawn on Tuesday.
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
## 2019-12-14 NOTE — Progress Notes (Signed)
Spoke with patient and confirmed appointments scheduled for 02/13/20 at 10:20 AM & 11:20 AM with Dr. Caryl Comes and Dr. Rockey Situ. He verbalized understanding with no further questions.

## 2019-12-15 ENCOUNTER — Encounter: Payer: Self-pay | Admitting: Internal Medicine

## 2019-12-19 DIAGNOSIS — G8220 Paraplegia, unspecified: Secondary | ICD-10-CM | POA: Diagnosis not present

## 2019-12-19 DIAGNOSIS — N319 Neuromuscular dysfunction of bladder, unspecified: Secondary | ICD-10-CM | POA: Diagnosis not present

## 2019-12-19 DIAGNOSIS — K219 Gastro-esophageal reflux disease without esophagitis: Secondary | ICD-10-CM | POA: Diagnosis not present

## 2019-12-19 DIAGNOSIS — M75102 Unspecified rotator cuff tear or rupture of left shoulder, not specified as traumatic: Secondary | ICD-10-CM | POA: Diagnosis not present

## 2019-12-19 DIAGNOSIS — G904 Autonomic dysreflexia: Secondary | ICD-10-CM | POA: Diagnosis not present

## 2019-12-19 DIAGNOSIS — D439 Neoplasm of uncertain behavior of central nervous system, unspecified: Secondary | ICD-10-CM | POA: Diagnosis not present

## 2019-12-19 DIAGNOSIS — I10 Essential (primary) hypertension: Secondary | ICD-10-CM | POA: Diagnosis not present

## 2019-12-19 DIAGNOSIS — Z87440 Personal history of urinary (tract) infections: Secondary | ICD-10-CM | POA: Diagnosis not present

## 2019-12-19 DIAGNOSIS — E109 Type 1 diabetes mellitus without complications: Secondary | ICD-10-CM | POA: Diagnosis not present

## 2019-12-20 DIAGNOSIS — K219 Gastro-esophageal reflux disease without esophagitis: Secondary | ICD-10-CM | POA: Diagnosis not present

## 2019-12-20 DIAGNOSIS — E109 Type 1 diabetes mellitus without complications: Secondary | ICD-10-CM | POA: Diagnosis not present

## 2019-12-20 DIAGNOSIS — G8220 Paraplegia, unspecified: Secondary | ICD-10-CM | POA: Diagnosis not present

## 2019-12-20 DIAGNOSIS — I10 Essential (primary) hypertension: Secondary | ICD-10-CM | POA: Diagnosis not present

## 2019-12-20 DIAGNOSIS — N319 Neuromuscular dysfunction of bladder, unspecified: Secondary | ICD-10-CM | POA: Diagnosis not present

## 2019-12-20 DIAGNOSIS — G904 Autonomic dysreflexia: Secondary | ICD-10-CM | POA: Diagnosis not present

## 2019-12-20 DIAGNOSIS — M75102 Unspecified rotator cuff tear or rupture of left shoulder, not specified as traumatic: Secondary | ICD-10-CM | POA: Diagnosis not present

## 2019-12-20 DIAGNOSIS — D439 Neoplasm of uncertain behavior of central nervous system, unspecified: Secondary | ICD-10-CM | POA: Diagnosis not present

## 2019-12-20 DIAGNOSIS — Z87440 Personal history of urinary (tract) infections: Secondary | ICD-10-CM | POA: Diagnosis not present

## 2019-12-21 ENCOUNTER — Ambulatory Visit: Payer: Medicare Other | Admitting: Internal Medicine

## 2019-12-21 DIAGNOSIS — G904 Autonomic dysreflexia: Secondary | ICD-10-CM | POA: Diagnosis not present

## 2019-12-21 DIAGNOSIS — E109 Type 1 diabetes mellitus without complications: Secondary | ICD-10-CM | POA: Diagnosis not present

## 2019-12-21 DIAGNOSIS — D439 Neoplasm of uncertain behavior of central nervous system, unspecified: Secondary | ICD-10-CM | POA: Diagnosis not present

## 2019-12-21 DIAGNOSIS — N319 Neuromuscular dysfunction of bladder, unspecified: Secondary | ICD-10-CM | POA: Diagnosis not present

## 2019-12-21 DIAGNOSIS — G8220 Paraplegia, unspecified: Secondary | ICD-10-CM | POA: Diagnosis not present

## 2019-12-21 DIAGNOSIS — I10 Essential (primary) hypertension: Secondary | ICD-10-CM | POA: Diagnosis not present

## 2019-12-21 DIAGNOSIS — M75102 Unspecified rotator cuff tear or rupture of left shoulder, not specified as traumatic: Secondary | ICD-10-CM | POA: Diagnosis not present

## 2019-12-21 DIAGNOSIS — Z87440 Personal history of urinary (tract) infections: Secondary | ICD-10-CM | POA: Diagnosis not present

## 2019-12-21 DIAGNOSIS — K219 Gastro-esophageal reflux disease without esophagitis: Secondary | ICD-10-CM | POA: Diagnosis not present

## 2019-12-21 DIAGNOSIS — D649 Anemia, unspecified: Secondary | ICD-10-CM | POA: Diagnosis not present

## 2019-12-22 DIAGNOSIS — Z87440 Personal history of urinary (tract) infections: Secondary | ICD-10-CM | POA: Diagnosis not present

## 2019-12-22 DIAGNOSIS — D439 Neoplasm of uncertain behavior of central nervous system, unspecified: Secondary | ICD-10-CM | POA: Diagnosis not present

## 2019-12-22 DIAGNOSIS — I10 Essential (primary) hypertension: Secondary | ICD-10-CM | POA: Diagnosis not present

## 2019-12-22 DIAGNOSIS — G8220 Paraplegia, unspecified: Secondary | ICD-10-CM | POA: Diagnosis not present

## 2019-12-22 DIAGNOSIS — G904 Autonomic dysreflexia: Secondary | ICD-10-CM | POA: Diagnosis not present

## 2019-12-22 DIAGNOSIS — E109 Type 1 diabetes mellitus without complications: Secondary | ICD-10-CM | POA: Diagnosis not present

## 2019-12-22 DIAGNOSIS — K219 Gastro-esophageal reflux disease without esophagitis: Secondary | ICD-10-CM | POA: Diagnosis not present

## 2019-12-22 DIAGNOSIS — M75102 Unspecified rotator cuff tear or rupture of left shoulder, not specified as traumatic: Secondary | ICD-10-CM | POA: Diagnosis not present

## 2019-12-22 DIAGNOSIS — N319 Neuromuscular dysfunction of bladder, unspecified: Secondary | ICD-10-CM | POA: Diagnosis not present

## 2019-12-22 LAB — IRON,TIBC AND FERRITIN PANEL
%SAT: 6
Ferritin: 32
Iron: 25
TIBC: 418
UIBC: 393

## 2019-12-22 LAB — CBC AND DIFFERENTIAL
HCT: 28 — AB (ref 41–53)
Hemoglobin: 9.3 — AB (ref 13.5–17.5)
Neutrophils Absolute: 6
Platelets: 303 (ref 150–399)
WBC: 8.2

## 2019-12-22 LAB — VITAMIN B12: Vitamin B-12: 813

## 2019-12-22 LAB — CBC: RBC: 3.14 — AB (ref 3.87–5.11)

## 2019-12-25 ENCOUNTER — Telehealth: Payer: Self-pay

## 2019-12-25 MED ORDER — AMOXICILLIN-POT CLAVULANATE 500-125 MG PO TABS: 1.0000 | ORAL_TABLET | Freq: Two times a day (BID) | ORAL | 0 refills | Status: DC

## 2019-12-25 NOTE — Telephone Encounter (Signed)
Rx sent in for Augmentin 500 mg to treat UTI per Dr Nicki Reaper. Received urine culture ordered by Dr Roland Rack. Pt stated he was having an odor to his urine and bladder spasms. Advised to take this medication for 1 week and a probiotic while on abx and for 2 weeks after completing. Patient confirmed no allergy to penicillin or augmentin. Patient was advised to call Dr Rogers Blocker (urology) to follow up with them after he completes abx.

## 2019-12-26 DIAGNOSIS — G8220 Paraplegia, unspecified: Secondary | ICD-10-CM | POA: Diagnosis not present

## 2019-12-26 DIAGNOSIS — M75102 Unspecified rotator cuff tear or rupture of left shoulder, not specified as traumatic: Secondary | ICD-10-CM | POA: Diagnosis not present

## 2019-12-26 DIAGNOSIS — Z87440 Personal history of urinary (tract) infections: Secondary | ICD-10-CM | POA: Diagnosis not present

## 2019-12-26 DIAGNOSIS — G904 Autonomic dysreflexia: Secondary | ICD-10-CM | POA: Diagnosis not present

## 2019-12-26 DIAGNOSIS — E109 Type 1 diabetes mellitus without complications: Secondary | ICD-10-CM | POA: Diagnosis not present

## 2019-12-26 DIAGNOSIS — D439 Neoplasm of uncertain behavior of central nervous system, unspecified: Secondary | ICD-10-CM | POA: Diagnosis not present

## 2019-12-26 DIAGNOSIS — N319 Neuromuscular dysfunction of bladder, unspecified: Secondary | ICD-10-CM | POA: Diagnosis not present

## 2019-12-26 DIAGNOSIS — I10 Essential (primary) hypertension: Secondary | ICD-10-CM | POA: Diagnosis not present

## 2019-12-26 DIAGNOSIS — K219 Gastro-esophageal reflux disease without esophagitis: Secondary | ICD-10-CM | POA: Diagnosis not present

## 2019-12-27 ENCOUNTER — Telehealth: Payer: Medicare Other | Admitting: Cardiovascular Disease

## 2019-12-27 DIAGNOSIS — Z135 Encounter for screening for eye and ear disorders: Secondary | ICD-10-CM | POA: Diagnosis not present

## 2019-12-28 DIAGNOSIS — E109 Type 1 diabetes mellitus without complications: Secondary | ICD-10-CM | POA: Diagnosis not present

## 2019-12-28 DIAGNOSIS — K219 Gastro-esophageal reflux disease without esophagitis: Secondary | ICD-10-CM | POA: Diagnosis not present

## 2019-12-28 DIAGNOSIS — G904 Autonomic dysreflexia: Secondary | ICD-10-CM | POA: Diagnosis not present

## 2019-12-28 DIAGNOSIS — I10 Essential (primary) hypertension: Secondary | ICD-10-CM | POA: Diagnosis not present

## 2019-12-28 DIAGNOSIS — G8220 Paraplegia, unspecified: Secondary | ICD-10-CM | POA: Diagnosis not present

## 2019-12-28 DIAGNOSIS — D439 Neoplasm of uncertain behavior of central nervous system, unspecified: Secondary | ICD-10-CM | POA: Diagnosis not present

## 2019-12-28 DIAGNOSIS — M75102 Unspecified rotator cuff tear or rupture of left shoulder, not specified as traumatic: Secondary | ICD-10-CM | POA: Diagnosis not present

## 2019-12-28 DIAGNOSIS — Z87440 Personal history of urinary (tract) infections: Secondary | ICD-10-CM | POA: Diagnosis not present

## 2019-12-28 DIAGNOSIS — N319 Neuromuscular dysfunction of bladder, unspecified: Secondary | ICD-10-CM | POA: Diagnosis not present

## 2020-01-02 ENCOUNTER — Telehealth: Payer: Self-pay | Admitting: Internal Medicine

## 2020-01-02 ENCOUNTER — Telehealth: Payer: Self-pay

## 2020-01-02 DIAGNOSIS — G904 Autonomic dysreflexia: Secondary | ICD-10-CM | POA: Diagnosis not present

## 2020-01-02 DIAGNOSIS — E109 Type 1 diabetes mellitus without complications: Secondary | ICD-10-CM | POA: Diagnosis not present

## 2020-01-02 DIAGNOSIS — N319 Neuromuscular dysfunction of bladder, unspecified: Secondary | ICD-10-CM | POA: Diagnosis not present

## 2020-01-02 DIAGNOSIS — I10 Essential (primary) hypertension: Secondary | ICD-10-CM | POA: Diagnosis not present

## 2020-01-02 DIAGNOSIS — K219 Gastro-esophageal reflux disease without esophagitis: Secondary | ICD-10-CM | POA: Diagnosis not present

## 2020-01-02 DIAGNOSIS — M75102 Unspecified rotator cuff tear or rupture of left shoulder, not specified as traumatic: Secondary | ICD-10-CM | POA: Diagnosis not present

## 2020-01-02 DIAGNOSIS — G8220 Paraplegia, unspecified: Secondary | ICD-10-CM | POA: Diagnosis not present

## 2020-01-02 DIAGNOSIS — Z87440 Personal history of urinary (tract) infections: Secondary | ICD-10-CM | POA: Diagnosis not present

## 2020-01-02 DIAGNOSIS — D439 Neoplasm of uncertain behavior of central nervous system, unspecified: Secondary | ICD-10-CM | POA: Diagnosis not present

## 2020-01-02 NOTE — Telephone Encounter (Signed)
Patient says breathing is better and that swelling in lower extremities is better almost gone , but still has swelling in upper legs and in buttocks . Patient says he feels fine now, advised him to cal cardiology and advise Dr. Rockey Situ of issues with BP dropping , patient said he would call his office.

## 2020-01-02 NOTE — Telephone Encounter (Signed)
error 

## 2020-01-02 NOTE — Telephone Encounter (Signed)
Lattie Haw, LPN with [facility] called and stated that pt's bp was 92/64 this morning. She stated that the pt told her this morning that everytime he taes the Furosemide 69m in the morning his blood pressure drops. He stated to her that he can tell that is is dropping because he starts to feel bad and that sometimes his systolic reading will drop as low as 60 to 70. The caregiver told LLattie Hawthis morning that the pt seemed to have blacked out for a few seconds this morning so the caregiver checked his bp and it was really low(no reading given). The caregiver gave the pt a glass of water with some salt tablets to bring it back up.  LLattie Haw [place]# [phone]

## 2020-01-02 NOTE — Telephone Encounter (Signed)
Please f/u with pt and confirm he is doing ok.  If persistent decrease in bp, may have to use lasix on an as needed basis.

## 2020-01-02 NOTE — Telephone Encounter (Signed)
Has a history of blood pressure dropping.  See previous notes.  Followed by cardiology as well.  They started lasix.  How is his swelling?  Breathing?  If ok, can hold lasix and follow swelling, breathing and blood pressure.  Confirm nothing more acute going on.

## 2020-01-04 DIAGNOSIS — M75102 Unspecified rotator cuff tear or rupture of left shoulder, not specified as traumatic: Secondary | ICD-10-CM | POA: Diagnosis not present

## 2020-01-04 DIAGNOSIS — K219 Gastro-esophageal reflux disease without esophagitis: Secondary | ICD-10-CM | POA: Diagnosis not present

## 2020-01-04 DIAGNOSIS — I10 Essential (primary) hypertension: Secondary | ICD-10-CM | POA: Diagnosis not present

## 2020-01-04 DIAGNOSIS — G904 Autonomic dysreflexia: Secondary | ICD-10-CM | POA: Diagnosis not present

## 2020-01-04 DIAGNOSIS — E109 Type 1 diabetes mellitus without complications: Secondary | ICD-10-CM | POA: Diagnosis not present

## 2020-01-04 DIAGNOSIS — N319 Neuromuscular dysfunction of bladder, unspecified: Secondary | ICD-10-CM | POA: Diagnosis not present

## 2020-01-04 DIAGNOSIS — D439 Neoplasm of uncertain behavior of central nervous system, unspecified: Secondary | ICD-10-CM | POA: Diagnosis not present

## 2020-01-04 DIAGNOSIS — G8220 Paraplegia, unspecified: Secondary | ICD-10-CM | POA: Diagnosis not present

## 2020-01-04 DIAGNOSIS — Z87440 Personal history of urinary (tract) infections: Secondary | ICD-10-CM | POA: Diagnosis not present

## 2020-01-04 NOTE — Telephone Encounter (Signed)
Patient is aware of PRN use Lasix and stated swelling no worse and that still having some low BP reading still advised to call cardiology and make them aware patient stated he had not called them. Patient will call furthre assistance needed.

## 2020-01-06 ENCOUNTER — Other Ambulatory Visit: Payer: Self-pay | Admitting: Internal Medicine

## 2020-01-09 DIAGNOSIS — G8220 Paraplegia, unspecified: Secondary | ICD-10-CM | POA: Diagnosis not present

## 2020-01-09 DIAGNOSIS — E109 Type 1 diabetes mellitus without complications: Secondary | ICD-10-CM | POA: Diagnosis not present

## 2020-01-09 DIAGNOSIS — K219 Gastro-esophageal reflux disease without esophagitis: Secondary | ICD-10-CM | POA: Diagnosis not present

## 2020-01-09 DIAGNOSIS — G8250 Quadriplegia, unspecified: Secondary | ICD-10-CM | POA: Diagnosis not present

## 2020-01-09 DIAGNOSIS — Z87440 Personal history of urinary (tract) infections: Secondary | ICD-10-CM | POA: Diagnosis not present

## 2020-01-09 DIAGNOSIS — R339 Retention of urine, unspecified: Secondary | ICD-10-CM | POA: Diagnosis not present

## 2020-01-09 DIAGNOSIS — G904 Autonomic dysreflexia: Secondary | ICD-10-CM | POA: Diagnosis not present

## 2020-01-09 DIAGNOSIS — I10 Essential (primary) hypertension: Secondary | ICD-10-CM | POA: Diagnosis not present

## 2020-01-09 DIAGNOSIS — D439 Neoplasm of uncertain behavior of central nervous system, unspecified: Secondary | ICD-10-CM | POA: Diagnosis not present

## 2020-01-09 DIAGNOSIS — M75102 Unspecified rotator cuff tear or rupture of left shoulder, not specified as traumatic: Secondary | ICD-10-CM | POA: Diagnosis not present

## 2020-01-09 DIAGNOSIS — N319 Neuromuscular dysfunction of bladder, unspecified: Secondary | ICD-10-CM | POA: Diagnosis not present

## 2020-01-10 DIAGNOSIS — G8220 Paraplegia, unspecified: Secondary | ICD-10-CM | POA: Diagnosis not present

## 2020-01-11 DIAGNOSIS — G904 Autonomic dysreflexia: Secondary | ICD-10-CM | POA: Diagnosis not present

## 2020-01-11 DIAGNOSIS — K219 Gastro-esophageal reflux disease without esophagitis: Secondary | ICD-10-CM | POA: Diagnosis not present

## 2020-01-11 DIAGNOSIS — D439 Neoplasm of uncertain behavior of central nervous system, unspecified: Secondary | ICD-10-CM | POA: Diagnosis not present

## 2020-01-11 DIAGNOSIS — E109 Type 1 diabetes mellitus without complications: Secondary | ICD-10-CM | POA: Diagnosis not present

## 2020-01-11 DIAGNOSIS — M75102 Unspecified rotator cuff tear or rupture of left shoulder, not specified as traumatic: Secondary | ICD-10-CM | POA: Diagnosis not present

## 2020-01-11 DIAGNOSIS — G8220 Paraplegia, unspecified: Secondary | ICD-10-CM | POA: Diagnosis not present

## 2020-01-11 DIAGNOSIS — I10 Essential (primary) hypertension: Secondary | ICD-10-CM | POA: Diagnosis not present

## 2020-01-11 DIAGNOSIS — N319 Neuromuscular dysfunction of bladder, unspecified: Secondary | ICD-10-CM | POA: Diagnosis not present

## 2020-01-11 DIAGNOSIS — Z87440 Personal history of urinary (tract) infections: Secondary | ICD-10-CM | POA: Diagnosis not present

## 2020-01-12 DIAGNOSIS — M75102 Unspecified rotator cuff tear or rupture of left shoulder, not specified as traumatic: Secondary | ICD-10-CM | POA: Diagnosis not present

## 2020-01-14 NOTE — Telephone Encounter (Signed)
He should have access to midodrine 5-10 mg TID prn for orthostasis. He was on this previously

## 2020-01-15 ENCOUNTER — Ambulatory Visit (INDEPENDENT_AMBULATORY_CARE_PROVIDER_SITE_OTHER): Payer: Medicare Other | Admitting: *Deleted

## 2020-01-15 DIAGNOSIS — R55 Syncope and collapse: Secondary | ICD-10-CM | POA: Diagnosis not present

## 2020-01-15 LAB — CUP PACEART REMOTE DEVICE CHECK
Date Time Interrogation Session: 20210207230848
Implantable Pulse Generator Implant Date: 20201203

## 2020-01-16 ENCOUNTER — Ambulatory Visit: Payer: Medicare Other | Admitting: Cardiovascular Disease

## 2020-01-16 DIAGNOSIS — I10 Essential (primary) hypertension: Secondary | ICD-10-CM | POA: Diagnosis not present

## 2020-01-16 DIAGNOSIS — Z87440 Personal history of urinary (tract) infections: Secondary | ICD-10-CM | POA: Diagnosis not present

## 2020-01-16 DIAGNOSIS — G904 Autonomic dysreflexia: Secondary | ICD-10-CM | POA: Diagnosis not present

## 2020-01-16 DIAGNOSIS — D439 Neoplasm of uncertain behavior of central nervous system, unspecified: Secondary | ICD-10-CM | POA: Diagnosis not present

## 2020-01-16 DIAGNOSIS — K219 Gastro-esophageal reflux disease without esophagitis: Secondary | ICD-10-CM | POA: Diagnosis not present

## 2020-01-16 DIAGNOSIS — G8220 Paraplegia, unspecified: Secondary | ICD-10-CM | POA: Diagnosis not present

## 2020-01-16 DIAGNOSIS — E109 Type 1 diabetes mellitus without complications: Secondary | ICD-10-CM | POA: Diagnosis not present

## 2020-01-16 DIAGNOSIS — M75102 Unspecified rotator cuff tear or rupture of left shoulder, not specified as traumatic: Secondary | ICD-10-CM | POA: Diagnosis not present

## 2020-01-16 DIAGNOSIS — I509 Heart failure, unspecified: Secondary | ICD-10-CM | POA: Diagnosis not present

## 2020-01-16 DIAGNOSIS — N319 Neuromuscular dysfunction of bladder, unspecified: Secondary | ICD-10-CM | POA: Diagnosis not present

## 2020-01-16 NOTE — Progress Notes (Signed)
ILR Remote 

## 2020-01-18 DIAGNOSIS — I10 Essential (primary) hypertension: Secondary | ICD-10-CM | POA: Diagnosis not present

## 2020-01-18 DIAGNOSIS — K219 Gastro-esophageal reflux disease without esophagitis: Secondary | ICD-10-CM | POA: Diagnosis not present

## 2020-01-18 DIAGNOSIS — E109 Type 1 diabetes mellitus without complications: Secondary | ICD-10-CM | POA: Diagnosis not present

## 2020-01-18 DIAGNOSIS — G904 Autonomic dysreflexia: Secondary | ICD-10-CM | POA: Diagnosis not present

## 2020-01-18 DIAGNOSIS — N319 Neuromuscular dysfunction of bladder, unspecified: Secondary | ICD-10-CM | POA: Diagnosis not present

## 2020-01-18 DIAGNOSIS — G8220 Paraplegia, unspecified: Secondary | ICD-10-CM | POA: Diagnosis not present

## 2020-01-18 DIAGNOSIS — M75102 Unspecified rotator cuff tear or rupture of left shoulder, not specified as traumatic: Secondary | ICD-10-CM | POA: Diagnosis not present

## 2020-01-18 DIAGNOSIS — Z87440 Personal history of urinary (tract) infections: Secondary | ICD-10-CM | POA: Diagnosis not present

## 2020-01-18 DIAGNOSIS — I509 Heart failure, unspecified: Secondary | ICD-10-CM | POA: Diagnosis not present

## 2020-01-18 DIAGNOSIS — D439 Neoplasm of uncertain behavior of central nervous system, unspecified: Secondary | ICD-10-CM | POA: Diagnosis not present

## 2020-01-19 DIAGNOSIS — S46101D Unspecified injury of muscle, fascia and tendon of long head of biceps, right arm, subsequent encounter: Secondary | ICD-10-CM | POA: Diagnosis not present

## 2020-01-19 DIAGNOSIS — M75111 Incomplete rotator cuff tear or rupture of right shoulder, not specified as traumatic: Secondary | ICD-10-CM | POA: Diagnosis not present

## 2020-01-19 DIAGNOSIS — M24111 Other articular cartilage disorders, right shoulder: Secondary | ICD-10-CM | POA: Diagnosis not present

## 2020-01-19 DIAGNOSIS — M7581 Other shoulder lesions, right shoulder: Secondary | ICD-10-CM | POA: Diagnosis not present

## 2020-01-23 ENCOUNTER — Other Ambulatory Visit: Payer: Self-pay | Admitting: Internal Medicine

## 2020-01-23 DIAGNOSIS — I10 Essential (primary) hypertension: Secondary | ICD-10-CM | POA: Diagnosis not present

## 2020-01-23 DIAGNOSIS — I509 Heart failure, unspecified: Secondary | ICD-10-CM | POA: Diagnosis not present

## 2020-01-23 DIAGNOSIS — E109 Type 1 diabetes mellitus without complications: Secondary | ICD-10-CM | POA: Diagnosis not present

## 2020-01-23 DIAGNOSIS — M75102 Unspecified rotator cuff tear or rupture of left shoulder, not specified as traumatic: Secondary | ICD-10-CM | POA: Diagnosis not present

## 2020-01-23 DIAGNOSIS — D439 Neoplasm of uncertain behavior of central nervous system, unspecified: Secondary | ICD-10-CM | POA: Diagnosis not present

## 2020-01-23 DIAGNOSIS — G904 Autonomic dysreflexia: Secondary | ICD-10-CM | POA: Diagnosis not present

## 2020-01-23 DIAGNOSIS — K219 Gastro-esophageal reflux disease without esophagitis: Secondary | ICD-10-CM | POA: Diagnosis not present

## 2020-01-23 DIAGNOSIS — Z87440 Personal history of urinary (tract) infections: Secondary | ICD-10-CM | POA: Diagnosis not present

## 2020-01-23 DIAGNOSIS — N319 Neuromuscular dysfunction of bladder, unspecified: Secondary | ICD-10-CM | POA: Diagnosis not present

## 2020-01-23 DIAGNOSIS — G8220 Paraplegia, unspecified: Secondary | ICD-10-CM | POA: Diagnosis not present

## 2020-01-25 DIAGNOSIS — G8220 Paraplegia, unspecified: Secondary | ICD-10-CM | POA: Diagnosis not present

## 2020-01-25 DIAGNOSIS — Z87440 Personal history of urinary (tract) infections: Secondary | ICD-10-CM | POA: Diagnosis not present

## 2020-01-25 DIAGNOSIS — I10 Essential (primary) hypertension: Secondary | ICD-10-CM | POA: Diagnosis not present

## 2020-01-25 DIAGNOSIS — N319 Neuromuscular dysfunction of bladder, unspecified: Secondary | ICD-10-CM | POA: Diagnosis not present

## 2020-01-25 DIAGNOSIS — G904 Autonomic dysreflexia: Secondary | ICD-10-CM | POA: Diagnosis not present

## 2020-01-25 DIAGNOSIS — K219 Gastro-esophageal reflux disease without esophagitis: Secondary | ICD-10-CM | POA: Diagnosis not present

## 2020-01-25 DIAGNOSIS — E109 Type 1 diabetes mellitus without complications: Secondary | ICD-10-CM | POA: Diagnosis not present

## 2020-01-25 DIAGNOSIS — D439 Neoplasm of uncertain behavior of central nervous system, unspecified: Secondary | ICD-10-CM | POA: Diagnosis not present

## 2020-01-25 DIAGNOSIS — M75102 Unspecified rotator cuff tear or rupture of left shoulder, not specified as traumatic: Secondary | ICD-10-CM | POA: Diagnosis not present

## 2020-01-25 DIAGNOSIS — I509 Heart failure, unspecified: Secondary | ICD-10-CM | POA: Diagnosis not present

## 2020-01-30 DIAGNOSIS — E109 Type 1 diabetes mellitus without complications: Secondary | ICD-10-CM | POA: Diagnosis not present

## 2020-01-30 DIAGNOSIS — N319 Neuromuscular dysfunction of bladder, unspecified: Secondary | ICD-10-CM | POA: Diagnosis not present

## 2020-01-30 DIAGNOSIS — I10 Essential (primary) hypertension: Secondary | ICD-10-CM | POA: Diagnosis not present

## 2020-01-30 DIAGNOSIS — Z87440 Personal history of urinary (tract) infections: Secondary | ICD-10-CM | POA: Diagnosis not present

## 2020-01-30 DIAGNOSIS — M75102 Unspecified rotator cuff tear or rupture of left shoulder, not specified as traumatic: Secondary | ICD-10-CM | POA: Diagnosis not present

## 2020-01-30 DIAGNOSIS — D439 Neoplasm of uncertain behavior of central nervous system, unspecified: Secondary | ICD-10-CM | POA: Diagnosis not present

## 2020-01-30 DIAGNOSIS — G8220 Paraplegia, unspecified: Secondary | ICD-10-CM | POA: Diagnosis not present

## 2020-01-30 DIAGNOSIS — I509 Heart failure, unspecified: Secondary | ICD-10-CM | POA: Diagnosis not present

## 2020-01-30 DIAGNOSIS — K219 Gastro-esophageal reflux disease without esophagitis: Secondary | ICD-10-CM | POA: Diagnosis not present

## 2020-01-30 DIAGNOSIS — G904 Autonomic dysreflexia: Secondary | ICD-10-CM | POA: Diagnosis not present

## 2020-02-01 DIAGNOSIS — I509 Heart failure, unspecified: Secondary | ICD-10-CM | POA: Diagnosis not present

## 2020-02-01 DIAGNOSIS — E109 Type 1 diabetes mellitus without complications: Secondary | ICD-10-CM | POA: Diagnosis not present

## 2020-02-01 DIAGNOSIS — K219 Gastro-esophageal reflux disease without esophagitis: Secondary | ICD-10-CM | POA: Diagnosis not present

## 2020-02-01 DIAGNOSIS — D439 Neoplasm of uncertain behavior of central nervous system, unspecified: Secondary | ICD-10-CM | POA: Diagnosis not present

## 2020-02-01 DIAGNOSIS — N319 Neuromuscular dysfunction of bladder, unspecified: Secondary | ICD-10-CM | POA: Diagnosis not present

## 2020-02-01 DIAGNOSIS — G8220 Paraplegia, unspecified: Secondary | ICD-10-CM | POA: Diagnosis not present

## 2020-02-01 DIAGNOSIS — G904 Autonomic dysreflexia: Secondary | ICD-10-CM | POA: Diagnosis not present

## 2020-02-01 DIAGNOSIS — M75102 Unspecified rotator cuff tear or rupture of left shoulder, not specified as traumatic: Secondary | ICD-10-CM | POA: Diagnosis not present

## 2020-02-01 DIAGNOSIS — Z87440 Personal history of urinary (tract) infections: Secondary | ICD-10-CM | POA: Diagnosis not present

## 2020-02-01 DIAGNOSIS — I10 Essential (primary) hypertension: Secondary | ICD-10-CM | POA: Diagnosis not present

## 2020-02-05 ENCOUNTER — Other Ambulatory Visit: Payer: Self-pay | Admitting: Family

## 2020-02-05 ENCOUNTER — Other Ambulatory Visit: Payer: Self-pay | Admitting: Cardiovascular Disease

## 2020-02-05 DIAGNOSIS — I5032 Chronic diastolic (congestive) heart failure: Secondary | ICD-10-CM

## 2020-02-05 MED ORDER — FUROSEMIDE 20 MG PO TABS: 20.0000 mg | ORAL_TABLET | Freq: Every day | ORAL | 0 refills | Status: DC

## 2020-02-05 NOTE — Telephone Encounter (Signed)
°*  STAT* If patient is at the pharmacy, call can be transferred to refill team.   1. Which medications need to be refilled? (please list name of each medication and dose if known) Fluid pill  2. Which pharmacy/location (including street and city if local pharmacy) is medication to be sent to? [facility]  3. Do they need a 30 day or 90 day supply? [place]

## 2020-02-06 DIAGNOSIS — K219 Gastro-esophageal reflux disease without esophagitis: Secondary | ICD-10-CM | POA: Diagnosis not present

## 2020-02-06 DIAGNOSIS — D439 Neoplasm of uncertain behavior of central nervous system, unspecified: Secondary | ICD-10-CM | POA: Diagnosis not present

## 2020-02-06 DIAGNOSIS — I509 Heart failure, unspecified: Secondary | ICD-10-CM | POA: Diagnosis not present

## 2020-02-06 DIAGNOSIS — M75102 Unspecified rotator cuff tear or rupture of left shoulder, not specified as traumatic: Secondary | ICD-10-CM | POA: Diagnosis not present

## 2020-02-06 DIAGNOSIS — E109 Type 1 diabetes mellitus without complications: Secondary | ICD-10-CM | POA: Diagnosis not present

## 2020-02-06 DIAGNOSIS — G8220 Paraplegia, unspecified: Secondary | ICD-10-CM | POA: Diagnosis not present

## 2020-02-06 DIAGNOSIS — N319 Neuromuscular dysfunction of bladder, unspecified: Secondary | ICD-10-CM | POA: Diagnosis not present

## 2020-02-06 DIAGNOSIS — G904 Autonomic dysreflexia: Secondary | ICD-10-CM | POA: Diagnosis not present

## 2020-02-06 DIAGNOSIS — Z87440 Personal history of urinary (tract) infections: Secondary | ICD-10-CM | POA: Diagnosis not present

## 2020-02-06 DIAGNOSIS — I10 Essential (primary) hypertension: Secondary | ICD-10-CM | POA: Diagnosis not present

## 2020-02-07 DIAGNOSIS — G8220 Paraplegia, unspecified: Secondary | ICD-10-CM | POA: Diagnosis not present

## 2020-02-07 DIAGNOSIS — G8250 Quadriplegia, unspecified: Secondary | ICD-10-CM | POA: Diagnosis not present

## 2020-02-07 DIAGNOSIS — R339 Retention of urine, unspecified: Secondary | ICD-10-CM | POA: Diagnosis not present

## 2020-02-07 DIAGNOSIS — E109 Type 1 diabetes mellitus without complications: Secondary | ICD-10-CM | POA: Diagnosis not present

## 2020-02-08 ENCOUNTER — Encounter: Payer: Medicare Other | Admitting: Internal Medicine

## 2020-02-09 DIAGNOSIS — M7582 Other shoulder lesions, left shoulder: Secondary | ICD-10-CM | POA: Diagnosis not present

## 2020-02-09 DIAGNOSIS — S46012D Strain of muscle(s) and tendon(s) of the rotator cuff of left shoulder, subsequent encounter: Secondary | ICD-10-CM | POA: Diagnosis not present

## 2020-02-13 ENCOUNTER — Ambulatory Visit: Payer: Medicare Other | Admitting: Cardiovascular Disease

## 2020-02-13 ENCOUNTER — Encounter: Payer: Medicare Other | Admitting: Internal Medicine

## 2020-02-13 DIAGNOSIS — G904 Autonomic dysreflexia: Secondary | ICD-10-CM | POA: Diagnosis not present

## 2020-02-13 DIAGNOSIS — E109 Type 1 diabetes mellitus without complications: Secondary | ICD-10-CM | POA: Diagnosis not present

## 2020-02-13 DIAGNOSIS — G8220 Paraplegia, unspecified: Secondary | ICD-10-CM | POA: Diagnosis not present

## 2020-02-13 DIAGNOSIS — I10 Essential (primary) hypertension: Secondary | ICD-10-CM | POA: Diagnosis not present

## 2020-02-13 DIAGNOSIS — N319 Neuromuscular dysfunction of bladder, unspecified: Secondary | ICD-10-CM | POA: Diagnosis not present

## 2020-02-13 DIAGNOSIS — D439 Neoplasm of uncertain behavior of central nervous system, unspecified: Secondary | ICD-10-CM | POA: Diagnosis not present

## 2020-02-13 DIAGNOSIS — M75102 Unspecified rotator cuff tear or rupture of left shoulder, not specified as traumatic: Secondary | ICD-10-CM | POA: Diagnosis not present

## 2020-02-13 DIAGNOSIS — K219 Gastro-esophageal reflux disease without esophagitis: Secondary | ICD-10-CM | POA: Diagnosis not present

## 2020-02-13 DIAGNOSIS — Z87440 Personal history of urinary (tract) infections: Secondary | ICD-10-CM | POA: Diagnosis not present

## 2020-02-13 DIAGNOSIS — I509 Heart failure, unspecified: Secondary | ICD-10-CM | POA: Diagnosis not present

## 2020-02-14 DIAGNOSIS — K219 Gastro-esophageal reflux disease without esophagitis: Secondary | ICD-10-CM | POA: Diagnosis not present

## 2020-02-14 DIAGNOSIS — D439 Neoplasm of uncertain behavior of central nervous system, unspecified: Secondary | ICD-10-CM | POA: Diagnosis not present

## 2020-02-14 DIAGNOSIS — E109 Type 1 diabetes mellitus without complications: Secondary | ICD-10-CM | POA: Diagnosis not present

## 2020-02-14 DIAGNOSIS — Z87440 Personal history of urinary (tract) infections: Secondary | ICD-10-CM | POA: Diagnosis not present

## 2020-02-14 DIAGNOSIS — I509 Heart failure, unspecified: Secondary | ICD-10-CM | POA: Diagnosis not present

## 2020-02-14 DIAGNOSIS — M75102 Unspecified rotator cuff tear or rupture of left shoulder, not specified as traumatic: Secondary | ICD-10-CM | POA: Diagnosis not present

## 2020-02-14 DIAGNOSIS — G8220 Paraplegia, unspecified: Secondary | ICD-10-CM | POA: Diagnosis not present

## 2020-02-14 DIAGNOSIS — G904 Autonomic dysreflexia: Secondary | ICD-10-CM | POA: Diagnosis not present

## 2020-02-14 DIAGNOSIS — I10 Essential (primary) hypertension: Secondary | ICD-10-CM | POA: Diagnosis not present

## 2020-02-14 DIAGNOSIS — N319 Neuromuscular dysfunction of bladder, unspecified: Secondary | ICD-10-CM | POA: Diagnosis not present

## 2020-02-16 ENCOUNTER — Encounter: Payer: Self-pay | Admitting: Internal Medicine

## 2020-02-19 ENCOUNTER — Ambulatory Visit (INDEPENDENT_AMBULATORY_CARE_PROVIDER_SITE_OTHER): Payer: Medicare Other | Admitting: *Deleted

## 2020-02-19 DIAGNOSIS — R55 Syncope and collapse: Secondary | ICD-10-CM

## 2020-02-19 LAB — CUP PACEART REMOTE DEVICE CHECK
Date Time Interrogation Session: 20210314233906
Implantable Pulse Generator Implant Date: 20201203

## 2020-02-20 DIAGNOSIS — I10 Essential (primary) hypertension: Secondary | ICD-10-CM | POA: Diagnosis not present

## 2020-02-20 DIAGNOSIS — N319 Neuromuscular dysfunction of bladder, unspecified: Secondary | ICD-10-CM | POA: Diagnosis not present

## 2020-02-20 DIAGNOSIS — I509 Heart failure, unspecified: Secondary | ICD-10-CM | POA: Diagnosis not present

## 2020-02-20 DIAGNOSIS — E109 Type 1 diabetes mellitus without complications: Secondary | ICD-10-CM | POA: Diagnosis not present

## 2020-02-20 DIAGNOSIS — Z87440 Personal history of urinary (tract) infections: Secondary | ICD-10-CM | POA: Diagnosis not present

## 2020-02-20 DIAGNOSIS — M75102 Unspecified rotator cuff tear or rupture of left shoulder, not specified as traumatic: Secondary | ICD-10-CM | POA: Diagnosis not present

## 2020-02-20 DIAGNOSIS — D439 Neoplasm of uncertain behavior of central nervous system, unspecified: Secondary | ICD-10-CM | POA: Diagnosis not present

## 2020-02-20 DIAGNOSIS — G904 Autonomic dysreflexia: Secondary | ICD-10-CM | POA: Diagnosis not present

## 2020-02-20 DIAGNOSIS — K219 Gastro-esophageal reflux disease without esophagitis: Secondary | ICD-10-CM | POA: Diagnosis not present

## 2020-02-20 DIAGNOSIS — G8220 Paraplegia, unspecified: Secondary | ICD-10-CM | POA: Diagnosis not present

## 2020-02-20 NOTE — Progress Notes (Signed)
ILR Remote 

## 2020-02-22 DIAGNOSIS — Z87440 Personal history of urinary (tract) infections: Secondary | ICD-10-CM | POA: Diagnosis not present

## 2020-02-22 DIAGNOSIS — M75102 Unspecified rotator cuff tear or rupture of left shoulder, not specified as traumatic: Secondary | ICD-10-CM | POA: Diagnosis not present

## 2020-02-22 DIAGNOSIS — D439 Neoplasm of uncertain behavior of central nervous system, unspecified: Secondary | ICD-10-CM | POA: Diagnosis not present

## 2020-02-22 DIAGNOSIS — I10 Essential (primary) hypertension: Secondary | ICD-10-CM | POA: Diagnosis not present

## 2020-02-22 DIAGNOSIS — K219 Gastro-esophageal reflux disease without esophagitis: Secondary | ICD-10-CM | POA: Diagnosis not present

## 2020-02-22 DIAGNOSIS — G8220 Paraplegia, unspecified: Secondary | ICD-10-CM | POA: Diagnosis not present

## 2020-02-22 DIAGNOSIS — G904 Autonomic dysreflexia: Secondary | ICD-10-CM | POA: Diagnosis not present

## 2020-02-22 DIAGNOSIS — N319 Neuromuscular dysfunction of bladder, unspecified: Secondary | ICD-10-CM | POA: Diagnosis not present

## 2020-02-22 DIAGNOSIS — I509 Heart failure, unspecified: Secondary | ICD-10-CM | POA: Diagnosis not present

## 2020-02-22 DIAGNOSIS — E109 Type 1 diabetes mellitus without complications: Secondary | ICD-10-CM | POA: Diagnosis not present

## 2020-02-23 DIAGNOSIS — S46012D Strain of muscle(s) and tendon(s) of the rotator cuff of left shoulder, subsequent encounter: Secondary | ICD-10-CM | POA: Diagnosis not present

## 2020-02-23 DIAGNOSIS — M25512 Pain in left shoulder: Secondary | ICD-10-CM | POA: Diagnosis not present

## 2020-02-23 DIAGNOSIS — R6889 Other general symptoms and signs: Secondary | ICD-10-CM | POA: Diagnosis not present

## 2020-02-23 DIAGNOSIS — M7582 Other shoulder lesions, left shoulder: Secondary | ICD-10-CM | POA: Diagnosis not present

## 2020-02-23 DIAGNOSIS — E109 Type 1 diabetes mellitus without complications: Secondary | ICD-10-CM | POA: Diagnosis not present

## 2020-02-27 DIAGNOSIS — G904 Autonomic dysreflexia: Secondary | ICD-10-CM | POA: Diagnosis not present

## 2020-02-27 DIAGNOSIS — D439 Neoplasm of uncertain behavior of central nervous system, unspecified: Secondary | ICD-10-CM | POA: Diagnosis not present

## 2020-02-27 DIAGNOSIS — I509 Heart failure, unspecified: Secondary | ICD-10-CM | POA: Diagnosis not present

## 2020-02-27 DIAGNOSIS — G8220 Paraplegia, unspecified: Secondary | ICD-10-CM | POA: Diagnosis not present

## 2020-02-27 DIAGNOSIS — E109 Type 1 diabetes mellitus without complications: Secondary | ICD-10-CM | POA: Diagnosis not present

## 2020-02-27 DIAGNOSIS — K219 Gastro-esophageal reflux disease without esophagitis: Secondary | ICD-10-CM | POA: Diagnosis not present

## 2020-02-27 DIAGNOSIS — N319 Neuromuscular dysfunction of bladder, unspecified: Secondary | ICD-10-CM | POA: Diagnosis not present

## 2020-02-27 DIAGNOSIS — I10 Essential (primary) hypertension: Secondary | ICD-10-CM | POA: Diagnosis not present

## 2020-02-27 DIAGNOSIS — M75102 Unspecified rotator cuff tear or rupture of left shoulder, not specified as traumatic: Secondary | ICD-10-CM | POA: Diagnosis not present

## 2020-02-27 DIAGNOSIS — Z87440 Personal history of urinary (tract) infections: Secondary | ICD-10-CM | POA: Diagnosis not present

## 2020-02-29 ENCOUNTER — Other Ambulatory Visit: Payer: Self-pay | Admitting: Surgery

## 2020-02-29 ENCOUNTER — Encounter: Payer: Self-pay | Admitting: Internal Medicine

## 2020-03-01 ENCOUNTER — Other Ambulatory Visit: Payer: Self-pay

## 2020-03-01 MED ORDER — INSULIN DETEMIR 100 UNIT/ML ~~LOC~~ SOLN: SUBCUTANEOUS | 0 refills | Status: DC

## 2020-03-03 ENCOUNTER — Other Ambulatory Visit: Payer: Self-pay | Admitting: Internal Medicine

## 2020-03-04 ENCOUNTER — Other Ambulatory Visit: Payer: Self-pay | Admitting: Internal Medicine

## 2020-03-05 DIAGNOSIS — Z87440 Personal history of urinary (tract) infections: Secondary | ICD-10-CM | POA: Diagnosis not present

## 2020-03-05 DIAGNOSIS — I509 Heart failure, unspecified: Secondary | ICD-10-CM | POA: Diagnosis not present

## 2020-03-05 DIAGNOSIS — M75102 Unspecified rotator cuff tear or rupture of left shoulder, not specified as traumatic: Secondary | ICD-10-CM | POA: Diagnosis not present

## 2020-03-05 DIAGNOSIS — N319 Neuromuscular dysfunction of bladder, unspecified: Secondary | ICD-10-CM | POA: Diagnosis not present

## 2020-03-05 DIAGNOSIS — G8220 Paraplegia, unspecified: Secondary | ICD-10-CM | POA: Diagnosis not present

## 2020-03-05 DIAGNOSIS — G904 Autonomic dysreflexia: Secondary | ICD-10-CM | POA: Diagnosis not present

## 2020-03-05 DIAGNOSIS — E109 Type 1 diabetes mellitus without complications: Secondary | ICD-10-CM | POA: Diagnosis not present

## 2020-03-05 DIAGNOSIS — I10 Essential (primary) hypertension: Secondary | ICD-10-CM | POA: Diagnosis not present

## 2020-03-05 DIAGNOSIS — K219 Gastro-esophageal reflux disease without esophagitis: Secondary | ICD-10-CM | POA: Diagnosis not present

## 2020-03-05 DIAGNOSIS — D439 Neoplasm of uncertain behavior of central nervous system, unspecified: Secondary | ICD-10-CM | POA: Diagnosis not present

## 2020-03-06 DIAGNOSIS — E109 Type 1 diabetes mellitus without complications: Secondary | ICD-10-CM | POA: Diagnosis not present

## 2020-03-06 DIAGNOSIS — R339 Retention of urine, unspecified: Secondary | ICD-10-CM | POA: Diagnosis not present

## 2020-03-06 DIAGNOSIS — G8250 Quadriplegia, unspecified: Secondary | ICD-10-CM | POA: Diagnosis not present

## 2020-03-09 DIAGNOSIS — G8220 Paraplegia, unspecified: Secondary | ICD-10-CM | POA: Diagnosis not present

## 2020-03-11 ENCOUNTER — Encounter
Admission: RE | Admit: 2020-03-11 | Discharge: 2020-03-11 | Disposition: A | Discharge status: Home / Self Care | Payer: Medicare Other | Source: Ambulatory Visit | Attending: Surgery | Admitting: Surgery

## 2020-03-11 ENCOUNTER — Encounter: Payer: Self-pay | Admitting: Internal Medicine

## 2020-03-11 ENCOUNTER — Other Ambulatory Visit: Payer: Self-pay

## 2020-03-11 DIAGNOSIS — Z01818 Encounter for other preprocedural examination: Secondary | ICD-10-CM | POA: Insufficient documentation

## 2020-03-11 HISTORY — DX: Heart failure, unspecified: I50.9

## 2020-03-11 NOTE — Patient Instructions (Signed)
Your procedure is scheduled on: Thursday March 14, 2020 Report to Day Surgery. To find out your arrival time please call [phone] between 1PM - 3PM on Wednesday March 13, 2020.  Remember: Instructions that are not followed completely may result in serious medical risk,  up to and including death, or upon the discretion of your surgeon and anesthesiologist your  surgery may need to be rescheduled.     _X__ 1. Do not eat food after midnight the night before your procedure.                 No gum chewing or hard candies. You may drink clear liquids up to 2 hours                 before you are scheduled to arrive for your surgery- DO not drink clear                 liquids within 2 hours of the start of your surgery.                 Clear Liquids include:  water, apple juice without pulp, clear Gatorade, G2 or                  Gatorade Zero (avoid Red/Purple/Blue), Black Coffee or Tea (Do not add                 anything to coffee or tea).  __X__2.  On the morning of surgery brush your teeth with toothpaste and water, you                may rinse your mouth with mouthwash if you wish.  Do not swallow any toothpaste of mouthwash.     _X__ 3.  No Alcohol for 24 hours before or after surgery.   _X__ 4.  Do Not Smoke or use e-cigarettes For 24 Hours Prior to Your Surgery.                 Do not use any chewable tobacco products for at least 6 hours prior to                 surgery.  __x__  5.  Notify your doctor if there is any change in your medical condition      (cold, fever, infections).     Do not wear jewelry, make-up, hairpins, clips or nail polish. Do not wear lotions, powders, or perfumes. You may wear deodorant. Do not shave 48 hours prior to surgery. Men may shave face and neck. Do not bring valuables to the hospital.    [facility] is not responsible for any belongings or valuables.  Contacts, dentures or bridgework may not be worn into  surgery. Leave your suitcase in the car. After surgery it may be brought to your room. For patients admitted to the hospital, discharge time is determined by your treatment team.   Patients discharged the day of surgery will not be allowed to drive home.   Make arrangements for someone to be with you for the first 24 hours of your Same Day Discharge.   __x__ Take these medicines the morning of surgery with A SIP OF WATER:    1. baclofen (LIORESAL) 40 MG tablet  __x__ Use CHG (or wipes) as directed   __x__ Take 1/2 of usual insulin dose the night before surgery. No insulin the morning          of surgery.  __x__ Stop Anti-inflammatories such as ibuprofen, Aleve, naproxen, aspirin and or BC powders.    __x__ Stop supplements until after surgery.    __x__ Do not start any herbal supplements before your surgery.

## 2020-03-12 ENCOUNTER — Other Ambulatory Visit
Admission: RE | Admit: 2020-03-12 | Discharge: 2020-03-12 | Disposition: A | Discharge status: Home / Self Care | Payer: Medicare Other | Source: Ambulatory Visit | Attending: Surgery | Admitting: Surgery

## 2020-03-12 DIAGNOSIS — M25512 Pain in left shoulder: Secondary | ICD-10-CM | POA: Diagnosis not present

## 2020-03-12 DIAGNOSIS — Z20822 Contact with and (suspected) exposure to covid-19: Secondary | ICD-10-CM | POA: Diagnosis not present

## 2020-03-12 DIAGNOSIS — Z01812 Encounter for preprocedural laboratory examination: Secondary | ICD-10-CM | POA: Insufficient documentation

## 2020-03-12 DIAGNOSIS — S46012D Strain of muscle(s) and tendon(s) of the rotator cuff of left shoulder, subsequent encounter: Secondary | ICD-10-CM | POA: Diagnosis not present

## 2020-03-12 LAB — SARS CORONAVIRUS 2 (TAT 6-24 HRS): SARS Coronavirus 2: NEGATIVE

## 2020-03-12 LAB — CBC
HCT: 38.9 % — ABNORMAL LOW (ref 39.0–52.0)
Hemoglobin: 12.7 g/dL — ABNORMAL LOW (ref 13.0–17.0)
MCH: 27.5 pg (ref 26.0–34.0)
MCHC: 32.6 g/dL (ref 30.0–36.0)
MCV: 84.2 fL (ref 80.0–100.0)
Platelets: 248 10*3/uL (ref 150–400)
RBC: 4.62 MIL/uL (ref 4.22–5.81)
RDW: 16.1 % — ABNORMAL HIGH (ref 11.5–15.5)
WBC: 8.5 10*3/uL (ref 4.0–10.5)
nRBC: 0 % (ref 0.0–0.2)

## 2020-03-12 LAB — BASIC METABOLIC PANEL
Anion gap: 12 (ref 5–15)
BUN: 9 mg/dL (ref 6–20)
CO2: 28 mmol/L (ref 22–32)
Calcium: 9 mg/dL (ref 8.9–10.3)
Chloride: 93 mmol/L — ABNORMAL LOW (ref 98–111)
Creatinine, Ser: 0.65 mg/dL (ref 0.61–1.24)
GFR calc Af Amer: >60 mL/min (ref >60)
GFR calc non Af Amer: >60 mL/min (ref >60)
Glucose, Bld: 155 mg/dL — ABNORMAL HIGH (ref 70–99)
Potassium: 3.5 mmol/L (ref 3.5–5.1)
Sodium: 133 mmol/L — ABNORMAL LOW (ref 135–145)

## 2020-03-13 DIAGNOSIS — I10 Essential (primary) hypertension: Secondary | ICD-10-CM | POA: Diagnosis not present

## 2020-03-13 DIAGNOSIS — K219 Gastro-esophageal reflux disease without esophagitis: Secondary | ICD-10-CM | POA: Diagnosis not present

## 2020-03-13 DIAGNOSIS — D439 Neoplasm of uncertain behavior of central nervous system, unspecified: Secondary | ICD-10-CM | POA: Diagnosis not present

## 2020-03-13 DIAGNOSIS — G8220 Paraplegia, unspecified: Secondary | ICD-10-CM | POA: Diagnosis not present

## 2020-03-13 DIAGNOSIS — I509 Heart failure, unspecified: Secondary | ICD-10-CM | POA: Diagnosis not present

## 2020-03-13 DIAGNOSIS — Z87440 Personal history of urinary (tract) infections: Secondary | ICD-10-CM | POA: Diagnosis not present

## 2020-03-13 DIAGNOSIS — G904 Autonomic dysreflexia: Secondary | ICD-10-CM | POA: Diagnosis not present

## 2020-03-13 DIAGNOSIS — N319 Neuromuscular dysfunction of bladder, unspecified: Secondary | ICD-10-CM | POA: Diagnosis not present

## 2020-03-13 DIAGNOSIS — E109 Type 1 diabetes mellitus without complications: Secondary | ICD-10-CM | POA: Diagnosis not present

## 2020-03-13 DIAGNOSIS — M75102 Unspecified rotator cuff tear or rupture of left shoulder, not specified as traumatic: Secondary | ICD-10-CM | POA: Diagnosis not present

## 2020-03-14 ENCOUNTER — Encounter
Admission: RE | Disposition: A | Discharge status: Home / Self Care | Payer: Self-pay | Source: Home / Self Care | Attending: Surgery

## 2020-03-14 ENCOUNTER — Ambulatory Visit: Payer: Medicare Other | Admitting: Anesthesiology

## 2020-03-14 ENCOUNTER — Encounter: Payer: Self-pay | Admitting: Surgery

## 2020-03-14 ENCOUNTER — Ambulatory Visit
Admission: RE | Admit: 2020-03-14 | Discharge: 2020-03-14 | Disposition: A | Discharge status: Home / Self Care | Payer: Medicare Other | Attending: Surgery | Admitting: Surgery

## 2020-03-14 ENCOUNTER — Other Ambulatory Visit: Payer: Self-pay

## 2020-03-14 DIAGNOSIS — Y9389 Activity, other specified: Secondary | ICD-10-CM | POA: Diagnosis not present

## 2020-03-14 DIAGNOSIS — G8220 Paraplegia, unspecified: Secondary | ICD-10-CM | POA: Insufficient documentation

## 2020-03-14 DIAGNOSIS — S46012A Strain of muscle(s) and tendon(s) of the rotator cuff of left shoulder, initial encounter: Secondary | ICD-10-CM | POA: Insufficient documentation

## 2020-03-14 DIAGNOSIS — M1990 Unspecified osteoarthritis, unspecified site: Secondary | ICD-10-CM | POA: Diagnosis not present

## 2020-03-14 DIAGNOSIS — E119 Type 2 diabetes mellitus without complications: Secondary | ICD-10-CM | POA: Insufficient documentation

## 2020-03-14 DIAGNOSIS — Z79899 Other long term (current) drug therapy: Secondary | ICD-10-CM | POA: Diagnosis not present

## 2020-03-14 DIAGNOSIS — Z794 Long term (current) use of insulin: Secondary | ICD-10-CM | POA: Diagnosis not present

## 2020-03-14 DIAGNOSIS — Z888 Allergy status to other drugs, medicaments and biological substances status: Secondary | ICD-10-CM | POA: Diagnosis not present

## 2020-03-14 DIAGNOSIS — F329 Major depressive disorder, single episode, unspecified: Secondary | ICD-10-CM | POA: Diagnosis not present

## 2020-03-14 DIAGNOSIS — K449 Diaphragmatic hernia without obstruction or gangrene: Secondary | ICD-10-CM | POA: Diagnosis not present

## 2020-03-14 DIAGNOSIS — I10 Essential (primary) hypertension: Secondary | ICD-10-CM | POA: Diagnosis not present

## 2020-03-14 DIAGNOSIS — M75102 Unspecified rotator cuff tear or rupture of left shoulder, not specified as traumatic: Secondary | ICD-10-CM | POA: Diagnosis present

## 2020-03-14 DIAGNOSIS — E118 Type 2 diabetes mellitus with unspecified complications: Secondary | ICD-10-CM | POA: Diagnosis not present

## 2020-03-14 DIAGNOSIS — K219 Gastro-esophageal reflux disease without esophagitis: Secondary | ICD-10-CM | POA: Diagnosis not present

## 2020-03-14 DIAGNOSIS — Z8249 Family history of ischemic heart disease and other diseases of the circulatory system: Secondary | ICD-10-CM | POA: Insufficient documentation

## 2020-03-14 DIAGNOSIS — Z91041 Radiographic dye allergy status: Secondary | ICD-10-CM | POA: Diagnosis not present

## 2020-03-14 DIAGNOSIS — Z981 Arthrodesis status: Secondary | ICD-10-CM | POA: Diagnosis not present

## 2020-03-14 DIAGNOSIS — E109 Type 1 diabetes mellitus without complications: Secondary | ICD-10-CM | POA: Diagnosis not present

## 2020-03-14 DIAGNOSIS — T148XXS Other injury of unspecified body region, sequela: Secondary | ICD-10-CM | POA: Diagnosis not present

## 2020-03-14 DIAGNOSIS — X500XXA Overexertion from strenuous movement or load, initial encounter: Secondary | ICD-10-CM | POA: Diagnosis not present

## 2020-03-14 DIAGNOSIS — Z87442 Personal history of urinary calculi: Secondary | ICD-10-CM | POA: Insufficient documentation

## 2020-03-14 DIAGNOSIS — Z882 Allergy status to sulfonamides status: Secondary | ICD-10-CM | POA: Diagnosis not present

## 2020-03-14 DIAGNOSIS — M7582 Other shoulder lesions, left shoulder: Secondary | ICD-10-CM | POA: Diagnosis not present

## 2020-03-14 DIAGNOSIS — S46012D Strain of muscle(s) and tendon(s) of the rotator cuff of left shoulder, subsequent encounter: Secondary | ICD-10-CM | POA: Diagnosis not present

## 2020-03-14 HISTORY — PX: SHOULDER ARTHROSCOPY: SHX128

## 2020-03-14 HISTORY — PX: SHOULDER ARTHROSCOPY WITH OPEN ROTATOR CUFF REPAIR: SHX6092

## 2020-03-14 LAB — GLUCOSE, CAPILLARY
Glucose-Capillary: 103 mg/dL — ABNORMAL HIGH (ref 70–99)
Glucose-Capillary: 226 mg/dL — ABNORMAL HIGH (ref 70–99)
Glucose-Capillary: 223 mg/dL — ABNORMAL HIGH (ref 70–99)

## 2020-03-14 LAB — URINE DRUG SCREEN, QUALITATIVE (ARMC ONLY)
Amphetamines, Ur Screen: NOT DETECTED
Barbiturates, Ur Screen: NOT DETECTED
Benzodiazepine, Ur Scrn: NOT DETECTED
Cannabinoid 50 Ng, Ur ~~LOC~~: POSITIVE — AB
Cocaine Metabolite,Ur ~~LOC~~: NOT DETECTED
MDMA (Ecstasy)Ur Screen: NOT DETECTED
Methadone Scn, Ur: NOT DETECTED
Opiate, Ur Screen: NOT DETECTED
Phencyclidine (PCP) Ur S: NOT DETECTED
Tricyclic, Ur Screen: NOT DETECTED

## 2020-03-14 SURGERY — ARTHROSCOPY, SHOULDER WITH REPAIR, ROTATOR CUFF, OPEN
Anesthesia: General | Site: Shoulder | Laterality: Left

## 2020-03-14 MED ORDER — INSULIN ASPART 100 UNIT/ML ~~LOC~~ SOLN
SUBCUTANEOUS | Status: AC
  Administered 2020-03-14: 12:00:00 5 [IU] via SUBCUTANEOUS
  Filled 2020-03-14: qty 1

## 2020-03-14 MED ORDER — CEFAZOLIN SODIUM 1 G IJ SOLR
INTRAMUSCULAR | Status: AC
  Filled 2020-03-14: qty 20

## 2020-03-14 MED ORDER — ONDANSETRON HCL 4 MG/2ML IJ SOLN: 4.0000 mg | Freq: Four times a day (QID) | INTRAMUSCULAR | Status: DC | PRN

## 2020-03-14 MED ORDER — KETOROLAC TROMETHAMINE 30 MG/ML IJ SOLN
INTRAMUSCULAR | Status: AC
  Filled 2020-03-14: qty 1

## 2020-03-14 MED ORDER — PHENYLEPHRINE HCL (PRESSORS) 10 MG/ML IV SOLN
INTRAVENOUS | Status: DC | PRN
  Administered 2020-03-14: 100 ug via INTRAVENOUS
  Administered 2020-03-14: 50 ug via INTRAVENOUS

## 2020-03-14 MED ORDER — ACETAMINOPHEN 10 MG/ML IV SOLN
INTRAVENOUS | Status: DC | PRN
  Administered 2020-03-14: 1000 mg via INTRAVENOUS

## 2020-03-14 MED ORDER — SODIUM CHLORIDE 0.9 % IV SOLN
INTRAVENOUS | Status: DC | PRN
  Administered 2020-03-14: 08:00:00 10 ug/min via INTRAVENOUS

## 2020-03-14 MED ORDER — METOCLOPRAMIDE HCL 10 MG PO TABS: 5.0000 mg | ORAL_TABLET | Freq: Three times a day (TID) | ORAL | Status: DC | PRN

## 2020-03-14 MED ORDER — OXYCODONE HCL 5 MG PO TABS: 5.0000 mg | ORAL_TABLET | ORAL | 0 refills | Status: DC | PRN

## 2020-03-14 MED ORDER — SUGAMMADEX SODIUM 200 MG/2ML IV SOLN
INTRAVENOUS | Status: DC | PRN
  Administered 2020-03-14: 200 mg via INTRAVENOUS

## 2020-03-14 MED ORDER — POTASSIUM CHLORIDE IN NACL 20-0.9 MEQ/L-% IV SOLN
INTRAVENOUS | Status: DC
  Filled 2020-03-14: qty 1000

## 2020-03-14 MED ORDER — FAMOTIDINE 20 MG PO TABS: 20.0000 mg | ORAL_TABLET | Freq: Once | ORAL | Status: AC

## 2020-03-14 MED ORDER — ONDANSETRON HCL 4 MG/2ML IJ SOLN: 4.0000 mg | Freq: Once | INTRAMUSCULAR | Status: DC | PRN

## 2020-03-14 MED ORDER — GLYCOPYRROLATE 0.2 MG/ML IJ SOLN
INTRAMUSCULAR | Status: AC
  Filled 2020-03-14: qty 1

## 2020-03-14 MED ORDER — OXYCODONE HCL 5 MG PO TABS
5.0000 mg | ORAL_TABLET | ORAL | Status: DC | PRN
  Filled 2020-03-14: qty 2

## 2020-03-14 MED ORDER — HYDROMORPHONE HCL 1 MG/ML IJ SOLN: 0.5000 mg | INTRAMUSCULAR | Status: DC | PRN

## 2020-03-14 MED ORDER — FENTANYL CITRATE (PF) 100 MCG/2ML IJ SOLN
INTRAMUSCULAR | Status: AC
  Filled 2020-03-14: qty 2

## 2020-03-14 MED ORDER — DEXMEDETOMIDINE HCL IN NACL 200 MCG/50ML IV SOLN
INTRAVENOUS | Status: DC | PRN
  Administered 2020-03-14 (×3): 4 ug via INTRAVENOUS
  Administered 2020-03-14: 8 ug via INTRAVENOUS

## 2020-03-14 MED ORDER — ACETAMINOPHEN 10 MG/ML IV SOLN: 1000.0000 mg | Freq: Once | INTRAVENOUS | Status: DC | PRN

## 2020-03-14 MED ORDER — BUPIVACAINE-EPINEPHRINE 0.5% -1:200000 IJ SOLN
INTRAMUSCULAR | Status: DC | PRN
  Administered 2020-03-14: 30 mL

## 2020-03-14 MED ORDER — FAMOTIDINE 20 MG PO TABS
ORAL_TABLET | ORAL | Status: AC
  Administered 2020-03-14: 07:00:00 20 mg via ORAL
  Filled 2020-03-14: qty 1

## 2020-03-14 MED ORDER — HYDROMORPHONE HCL 1 MG/ML IJ SOLN
0.5000 mg | INTRAMUSCULAR | Status: AC | PRN
  Administered 2020-03-14 (×2): 0.5 mg via INTRAVENOUS

## 2020-03-14 MED ORDER — SEVOFLURANE IN SOLN
RESPIRATORY_TRACT | Status: AC
  Filled 2020-03-14: qty 250

## 2020-03-14 MED ORDER — HYDROMORPHONE HCL 1 MG/ML IJ SOLN
INTRAMUSCULAR | Status: AC
  Administered 2020-03-14: 12:00:00 0.5 mg via INTRAVENOUS
  Filled 2020-03-14: qty 1

## 2020-03-14 MED ORDER — MIDAZOLAM HCL 2 MG/2ML IJ SOLN
INTRAMUSCULAR | Status: AC
  Filled 2020-03-14: qty 2

## 2020-03-14 MED ORDER — KETAMINE HCL 50 MG/ML IJ SOLN
INTRAMUSCULAR | Status: AC
  Filled 2020-03-14: qty 10

## 2020-03-14 MED ORDER — ROCURONIUM BROMIDE 10 MG/ML (PF) SYRINGE
PREFILLED_SYRINGE | INTRAVENOUS | Status: AC
  Filled 2020-03-14: qty 10

## 2020-03-14 MED ORDER — KETOROLAC TROMETHAMINE 30 MG/ML IJ SOLN
INTRAMUSCULAR | Status: DC | PRN
  Administered 2020-03-14: 30 mg via INTRAVENOUS

## 2020-03-14 MED ORDER — LIDOCAINE HCL (CARDIAC) PF 100 MG/5ML IV SOSY
PREFILLED_SYRINGE | INTRAVENOUS | Status: DC | PRN
  Administered 2020-03-14: 50 mg via INTRAVENOUS

## 2020-03-14 MED ORDER — FENTANYL CITRATE (PF) 100 MCG/2ML IJ SOLN
INTRAMUSCULAR | Status: AC
  Administered 2020-03-14: 12:00:00 50 ug via INTRAVENOUS
  Filled 2020-03-14: qty 2

## 2020-03-14 MED ORDER — FENTANYL CITRATE (PF) 100 MCG/2ML IJ SOLN
INTRAMUSCULAR | Status: DC | PRN
  Administered 2020-03-14: 50 ug via INTRAVENOUS
  Administered 2020-03-14: 100 ug via INTRAVENOUS
  Administered 2020-03-14 (×2): 25 ug via INTRAVENOUS

## 2020-03-14 MED ORDER — PROPOFOL 10 MG/ML IV BOLUS
INTRAVENOUS | Status: DC | PRN
  Administered 2020-03-14: 150 mg via INTRAVENOUS

## 2020-03-14 MED ORDER — KETAMINE HCL 50 MG/ML IJ SOLN
INTRAMUSCULAR | Status: DC | PRN
  Administered 2020-03-14: 5 mg via INTRAVENOUS
  Administered 2020-03-14 (×2): 10 mg via INTRAVENOUS
  Administered 2020-03-14: 25 mg via INTRAVENOUS

## 2020-03-14 MED ORDER — DEXAMETHASONE SODIUM PHOSPHATE 10 MG/ML IJ SOLN
INTRAMUSCULAR | Status: DC | PRN
  Administered 2020-03-14: 5 mg via INTRAVENOUS

## 2020-03-14 MED ORDER — ROCURONIUM BROMIDE 100 MG/10ML IV SOLN
INTRAVENOUS | Status: DC | PRN
  Administered 2020-03-14: 10 mg via INTRAVENOUS
  Administered 2020-03-14: 60 mg via INTRAVENOUS

## 2020-03-14 MED ORDER — OXYCODONE HCL 5 MG PO TABS
5.0000 mg | ORAL_TABLET | Freq: Once | ORAL | Status: AC | PRN
  Administered 2020-03-14: 14:00:00 5 mg via ORAL

## 2020-03-14 MED ORDER — SUCCINYLCHOLINE CHLORIDE 200 MG/10ML IV SOSY
PREFILLED_SYRINGE | INTRAVENOUS | Status: AC
  Filled 2020-03-14: qty 10

## 2020-03-14 MED ORDER — LIDOCAINE HCL (PF) 2 % IJ SOLN
INTRAMUSCULAR | Status: AC
  Filled 2020-03-14: qty 5

## 2020-03-14 MED ORDER — PROPOFOL 10 MG/ML IV BOLUS
INTRAVENOUS | Status: AC
  Filled 2020-03-14: qty 20

## 2020-03-14 MED ORDER — DEXAMETHASONE SODIUM PHOSPHATE 10 MG/ML IJ SOLN
INTRAMUSCULAR | Status: AC
  Filled 2020-03-14: qty 1

## 2020-03-14 MED ORDER — MIDAZOLAM HCL 2 MG/2ML IJ SOLN
INTRAMUSCULAR | Status: DC | PRN
  Administered 2020-03-14: 2 mg via INTRAVENOUS

## 2020-03-14 MED ORDER — INSULIN ASPART 100 UNIT/ML ~~LOC~~ SOLN: 5.0000 [IU] | Freq: Once | SUBCUTANEOUS | Status: AC

## 2020-03-14 MED ORDER — OXYCODONE HCL 5 MG/5ML PO SOLN: 5.0000 mg | Freq: Once | ORAL | Status: AC | PRN

## 2020-03-14 MED ORDER — SODIUM CHLORIDE 0.9 % IV SOLN
INTRAVENOUS | Status: DC
  Administered 2020-03-14: 07:00:00 50 mL/h via INTRAVENOUS

## 2020-03-14 MED ORDER — METOCLOPRAMIDE HCL 5 MG/ML IJ SOLN: 5.0000 mg | Freq: Three times a day (TID) | INTRAMUSCULAR | Status: DC | PRN

## 2020-03-14 MED ORDER — ONDANSETRON HCL 4 MG/2ML IJ SOLN
INTRAMUSCULAR | Status: DC | PRN
  Administered 2020-03-14: 4 mg via INTRAVENOUS

## 2020-03-14 MED ORDER — FENTANYL CITRATE (PF) 100 MCG/2ML IJ SOLN
25.0000 ug | INTRAMUSCULAR | Status: DC | PRN
  Administered 2020-03-14: 50 ug via INTRAVENOUS

## 2020-03-14 MED ORDER — OXYCODONE HCL 5 MG PO TABS
ORAL_TABLET | ORAL | Status: AC
  Filled 2020-03-14: qty 1

## 2020-03-14 MED ORDER — CEFAZOLIN SODIUM-DEXTROSE 2-4 GM/100ML-% IV SOLN
INTRAVENOUS | Status: AC
  Filled 2020-03-14: qty 100

## 2020-03-14 MED ORDER — GLYCOPYRROLATE 0.2 MG/ML IJ SOLN
INTRAMUSCULAR | Status: DC | PRN
  Administered 2020-03-14: .2 mg via INTRAVENOUS

## 2020-03-14 MED ORDER — BUPIVACAINE HCL (PF) 0.5 % IJ SOLN
INTRAMUSCULAR | Status: AC
  Filled 2020-03-14: qty 30

## 2020-03-14 MED ORDER — DEXMEDETOMIDINE HCL IN NACL 80 MCG/20ML IV SOLN
INTRAVENOUS | Status: AC
  Filled 2020-03-14: qty 20

## 2020-03-14 MED ORDER — ACETAMINOPHEN NICU IV SYRINGE 10 MG/ML
INTRAVENOUS | Status: AC
  Filled 2020-03-14: qty 1

## 2020-03-14 MED ORDER — ONDANSETRON HCL 4 MG PO TABS: 4.0000 mg | ORAL_TABLET | Freq: Four times a day (QID) | ORAL | Status: DC | PRN

## 2020-03-14 MED ORDER — EPINEPHRINE PF 1 MG/ML IJ SOLN
INTRAMUSCULAR | Status: AC
  Filled 2020-03-14: qty 3

## 2020-03-14 MED ORDER — CEFAZOLIN SODIUM-DEXTROSE 2-4 GM/100ML-% IV SOLN
2.0000 g | INTRAVENOUS | Status: AC
  Administered 2020-03-14 (×2): 2 g via INTRAVENOUS

## 2020-03-14 MED ORDER — ONDANSETRON HCL 4 MG/2ML IJ SOLN
INTRAMUSCULAR | Status: AC
  Filled 2020-03-14: qty 2

## 2020-03-14 SURGICAL SUPPLY — 53 items
ANCH SUT KNTLS STRL SHLDR SYS (Anchor) ×4 IMPLANT
ANCH SUT Q-FX 2.8 (Anchor) ×6 IMPLANT
ANCHOR ALL-SUT Q-FIX 2.8 (Anchor) ×6 IMPLANT
ANCHOR SUT QUATTRO KNTLS 4.5 (Anchor) ×4 IMPLANT
APL PRP STRL LF DISP 70% ISPRP (MISCELLANEOUS) ×1
BIT DRILL JUGRKNT W/NDL BIT2.9 (DRILL) IMPLANT
BLADE FULL RADIUS 3.5 (BLADE) ×2 IMPLANT
BUR ACROMIONIZER 4.0 (BURR) ×2 IMPLANT
CANNULA SHAVER 8MMX76MM (CANNULA) ×2 IMPLANT
CHLORAPREP W/TINT 26 (MISCELLANEOUS) ×2 IMPLANT
COVER MAYO STAND REUSABLE (DRAPES) ×2 IMPLANT
COVER WAND RF STERILE (DRAPES) ×2 IMPLANT
DRAPE IMP U-DRAPE 54X76 (DRAPES) ×4 IMPLANT
DRILL JUGGERKNOT W/NDL BIT 2.9 (DRILL)
ELECT CAUTERY BLADE TIP 2.5 (TIP) ×2
ELECT REM PT RETURN 9FT ADLT (ELECTROSURGICAL) ×2
ELECTRODE CAUTERY BLDE TIP 2.5 (TIP) ×1 IMPLANT
ELECTRODE REM PT RTRN 9FT ADLT (ELECTROSURGICAL) ×1 IMPLANT
GAUZE SPONGE 4X4 12PLY STRL (GAUZE/BANDAGES/DRESSINGS) ×2 IMPLANT
GAUZE XEROFORM 1X8 LF (GAUZE/BANDAGES/DRESSINGS) ×2 IMPLANT
GLOVE BIO SURGEON STRL SZ7.5 (GLOVE) ×5 IMPLANT
GLOVE BIO SURGEON STRL SZ8 (GLOVE) ×5 IMPLANT
GLOVE BIOGEL PI IND STRL 8 (GLOVE) ×1 IMPLANT
GLOVE BIOGEL PI INDICATOR 8 (GLOVE) ×1
GLOVE INDICATOR 8.0 STRL GRN (GLOVE) ×2 IMPLANT
GOWN STRL REUS W/ TWL LRG LVL3 (GOWN DISPOSABLE) ×1 IMPLANT
GOWN STRL REUS W/ TWL XL LVL3 (GOWN DISPOSABLE) ×1 IMPLANT
GOWN STRL REUS W/TWL LRG LVL3 (GOWN DISPOSABLE) ×6
GOWN STRL REUS W/TWL XL LVL3 (GOWN DISPOSABLE) ×2
GRAFT TISS 40X70 3 THK DERM (Tissue) IMPLANT
GRASPER SUT 15 45D LOW PRO (SUTURE) IMPLANT
IV LACTATED RINGER IRRG 3000ML (IV SOLUTION) ×2
IV LR IRRIG 3000ML ARTHROMATIC (IV SOLUTION) ×2 IMPLANT
KIT CANNULA 8X76-LX IN CANNULA (CANNULA) IMPLANT
KIT SUTURE 2.8 Q-FIX DISP (MISCELLANEOUS) ×1 IMPLANT
MANIFOLD NEPTUNE II (INSTRUMENTS) ×2 IMPLANT
MASK FACE SPIDER DISP (MASK) ×2 IMPLANT
MAT ABSORB  FLUID 56X50 GRAY (MISCELLANEOUS) ×2
MAT ABSORB FLUID 56X50 GRAY (MISCELLANEOUS) ×1 IMPLANT
PACK ARTHROSCOPY SHOULDER (MISCELLANEOUS) ×2 IMPLANT
PASSER SUT FIRSTPASS SELF (INSTRUMENTS) ×3 IMPLANT
SLING ARM LRG DEEP (SOFTGOODS) ×1 IMPLANT
SLING ULTRA II LG (MISCELLANEOUS) ×2 IMPLANT
STAPLER SKIN PROX 35W (STAPLE) ×2 IMPLANT
STRAP SAFETY 5IN WIDE (MISCELLANEOUS) ×2 IMPLANT
SUT ETHIBOND 0 MO6 C/R (SUTURE) ×2 IMPLANT
SUT ULTRABRAID 2 COBRAID 38 (SUTURE) ×6 IMPLANT
SUT VIC AB 2-0 CT1 27 (SUTURE) ×4
SUT VIC AB 2-0 CT1 TAPERPNT 27 (SUTURE) ×2 IMPLANT
TAPE MICROFOAM 4IN (TAPE) ×2 IMPLANT
TISSUE ARTHOFLEX THICK 3MM (Tissue) ×2 IMPLANT
TUBING ARTHRO INFLOW-ONLY STRL (TUBING) ×2 IMPLANT
WAND WEREWOLF FLOW 90D (MISCELLANEOUS) ×2 IMPLANT

## 2020-03-14 NOTE — Discharge Instructions (Addendum)
Orthopedic discharge instructions: Keep dressing dry and intact.  May sponge bathe after dressing changed on post-op day #4 (Monday).  Cover staples with Band-Aids after drying off. Apply ice frequently to shoulder. Take ibuprofen 600-800 mg TID with meals for 7-10 days, then as necessary.    TID + 3 times per day (every 8 hours) Take oxycodone as prescribed when needed.  May supplement with ES Tylenol if necessary. Keep shoulder immobilizer on at all times except may loosen for bathing purposes. Follow-up in 10-14 days or as scheduled.    AMBULATORY SURGERY  DISCHARGE INSTRUCTIONS   1) The drugs that you were given will stay in your system until tomorrow so for the next 24 hours you should not:  A) Drive an automobile B) Make any legal decisions C) Drink any alcoholic beverage   2) You may resume regular meals tomorrow.  Today it is better to start with liquids and gradually work up to solid foods.  You may eat anything you prefer, but it is better to start with liquids, then soup and crackers, and gradually work up to solid foods.   3) Please notify your doctor immediately if you have any unusual bleeding, trouble breathing, redness and pain at the surgery site, drainage, fever, or pain not relieved by medication.    4) Additional Instructions:        Please contact your physician with any problems or Same Day Surgery at [phone], Monday through Friday 6 am to 4 pm, or [place] at [facility] number at [phone].
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
## 2020-03-14 NOTE — Transfer of Care (Signed)
Immediate Anesthesia Transfer of Care Note  Patient: [name]  Procedure(s) Performed: REPAIR OF RECURRENT LARGE ROTATOR CUFF TEAR, LEFT SHOULDER (Left Shoulder) ARTHROSCOPY SHOULDER (Left Shoulder)  Patient Location: PACU  Anesthesia Type:General  Level of Consciousness: awake  Airway & Oxygen Therapy: Patient Spontanous Breathing and Patient connected to face mask oxygen  Post-op Assessment: Report given to RN and Post -op Vital signs reviewed and stable  Post vital signs: Reviewed  Last Vitals:  Vitals Value Taken Time  BP 153/87 03/14/20 1145  Temp    Pulse 66 03/14/20 1146  Resp 21 03/14/20 1146  SpO2 99 % 03/14/20 1146  Vitals shown include unvalidated device data.  Last Pain:  Vitals:   03/14/20 0640  TempSrc: Tympanic  PainSc: 3          Complications: No apparent anesthesia complications

## 2020-03-14 NOTE — Anesthesia Procedure Notes (Signed)
Procedure Name: Intubation Performed by: Rolla Plate, CRNA Pre-anesthesia Checklist: Patient identified, Patient being monitored, Timeout performed, Emergency Drugs available and Suction available Patient Re-evaluated:Patient Re-evaluated prior to induction Oxygen Delivery Method: Circle system utilized Preoxygenation: Pre-oxygenation with 100% oxygen Induction Type: IV induction Ventilation: Mask ventilation without difficulty Laryngoscope Size: Miller and 2 Grade View: Grade I Tube type: Oral Tube size: 7.5 mm Number of attempts: 1 Airway Equipment and Method: Stylet Placement Confirmation: ETT inserted through vocal cords under direct vision,  positive ETCO2 and breath sounds checked- equal and bilateral Secured at: 23 cm Tube secured with: Tape Dental Injury: Teeth and Oropharynx as per pre-operative assessment
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
## 2020-03-14 NOTE — H&P (Signed)
Paper H&P to be scanned into permanent record. H&P reviewed and patient re-examined. No changes. 

## 2020-03-14 NOTE — Anesthesia Postprocedure Evaluation (Signed)
Anesthesia Post Note  Patient: [name]  Procedure(s) Performed: REPAIR OF RECURRENT LARGE ROTATOR CUFF TEAR, LEFT SHOULDER (Left Shoulder) ARTHROSCOPY SHOULDER (Left Shoulder)  Patient location during evaluation: PACU Anesthesia Type: General Level of consciousness: awake and alert Pain management: pain level controlled Vital Signs Assessment: post-procedure vital signs reviewed and stable Respiratory status: spontaneous breathing, nonlabored ventilation, respiratory function stable and patient connected to nasal cannula oxygen Cardiovascular status: blood pressure returned to baseline and stable Postop Assessment: no apparent nausea or vomiting Anesthetic complications: no     Last Vitals:  Vitals:   03/14/20 1301 03/14/20 1359  BP: 107/78 (!) 110/51  Pulse: 60 (!) 57  Resp: 12 16  Temp:  (!) 36.1 C  SpO2: 96% 100%    Last Pain:  Vitals:   03/14/20 1359  TempSrc: Tympanic  PainSc: 3                  Arita Miss

## 2020-03-19 ENCOUNTER — Ambulatory Visit: Payer: Medicare Other | Admitting: Cardiovascular Disease

## 2020-03-19 ENCOUNTER — Encounter: Payer: Medicare Other | Admitting: Internal Medicine

## 2020-03-25 ENCOUNTER — Ambulatory Visit (INDEPENDENT_AMBULATORY_CARE_PROVIDER_SITE_OTHER): Payer: Medicare Other | Admitting: *Deleted

## 2020-03-25 DIAGNOSIS — R55 Syncope and collapse: Secondary | ICD-10-CM | POA: Diagnosis not present

## 2020-03-26 LAB — CUP PACEART REMOTE DEVICE CHECK
Date Time Interrogation Session: 20210414234734
Implantable Pulse Generator Implant Date: 20201203

## 2020-03-26 NOTE — Progress Notes (Signed)
ILR Remote 

## 2020-03-27 ENCOUNTER — Encounter: Payer: Self-pay | Admitting: Internal Medicine

## 2020-04-05 ENCOUNTER — Encounter: Payer: Self-pay | Admitting: Internal Medicine

## 2020-04-07 ENCOUNTER — Other Ambulatory Visit: Payer: Self-pay | Admitting: Internal Medicine

## 2020-04-07 ENCOUNTER — Encounter: Payer: Self-pay | Admitting: Internal Medicine

## 2020-04-08 DIAGNOSIS — G8220 Paraplegia, unspecified: Secondary | ICD-10-CM | POA: Diagnosis not present

## 2020-04-09 ENCOUNTER — Other Ambulatory Visit: Payer: Self-pay

## 2020-04-09 MED ORDER — BACLOFEN 20 MG PO TABS: ORAL_TABLET | ORAL | 2 refills | Status: DC

## 2020-04-11 ENCOUNTER — Encounter: Payer: Self-pay | Admitting: Internal Medicine

## 2020-04-11 ENCOUNTER — Other Ambulatory Visit: Payer: Self-pay

## 2020-04-11 MED ORDER — INSULIN LISPRO (1 UNIT DIAL) 100 UNIT/ML (KWIKPEN): 3.0000 [IU] | PEN_INJECTOR | Freq: Every day | SUBCUTANEOUS | 4 refills | Status: DC

## 2020-04-11 MED ORDER — INSULIN DETEMIR 100 UNIT/ML ~~LOC~~ SOLN: SUBCUTANEOUS | 0 refills | Status: DC

## 2020-04-22 ENCOUNTER — Ambulatory Visit (INDEPENDENT_AMBULATORY_CARE_PROVIDER_SITE_OTHER): Payer: Medicare Other | Admitting: *Deleted

## 2020-04-22 DIAGNOSIS — I63443 Cerebral infarction due to embolism of bilateral cerebellar arteries: Secondary | ICD-10-CM | POA: Diagnosis not present

## 2020-04-22 LAB — CUP PACEART REMOTE DEVICE CHECK
Date Time Interrogation Session: 20210515235219
Implantable Pulse Generator Implant Date: 20201203

## 2020-04-23 NOTE — Progress Notes (Signed)
[facility]link Summary Report / Loop Recorder 

## 2020-05-08 DIAGNOSIS — B351 Tinea unguium: Secondary | ICD-10-CM | POA: Diagnosis not present

## 2020-05-08 DIAGNOSIS — E1140 Type 2 diabetes mellitus with diabetic neuropathy, unspecified: Secondary | ICD-10-CM | POA: Diagnosis not present

## 2020-05-08 DIAGNOSIS — L97521 Non-pressure chronic ulcer of other part of left foot limited to breakdown of skin: Secondary | ICD-10-CM | POA: Diagnosis not present

## 2020-05-08 DIAGNOSIS — Z794 Long term (current) use of insulin: Secondary | ICD-10-CM | POA: Diagnosis not present

## 2020-05-09 DIAGNOSIS — G8220 Paraplegia, unspecified: Secondary | ICD-10-CM | POA: Diagnosis not present

## 2020-05-14 DIAGNOSIS — G8250 Quadriplegia, unspecified: Secondary | ICD-10-CM | POA: Diagnosis not present

## 2020-05-14 DIAGNOSIS — E109 Type 1 diabetes mellitus without complications: Secondary | ICD-10-CM | POA: Diagnosis not present

## 2020-05-14 DIAGNOSIS — N200 Calculus of kidney: Secondary | ICD-10-CM | POA: Diagnosis not present

## 2020-05-14 DIAGNOSIS — Z8673 Personal history of transient ischemic attack (TIA), and cerebral infarction without residual deficits: Secondary | ICD-10-CM | POA: Diagnosis not present

## 2020-05-14 DIAGNOSIS — S46012D Strain of muscle(s) and tendon(s) of the rotator cuff of left shoulder, subsequent encounter: Secondary | ICD-10-CM | POA: Diagnosis not present

## 2020-05-20 DIAGNOSIS — E109 Type 1 diabetes mellitus without complications: Secondary | ICD-10-CM | POA: Diagnosis not present

## 2020-05-20 DIAGNOSIS — N200 Calculus of kidney: Secondary | ICD-10-CM | POA: Diagnosis not present

## 2020-05-20 DIAGNOSIS — G8250 Quadriplegia, unspecified: Secondary | ICD-10-CM | POA: Diagnosis not present

## 2020-05-20 DIAGNOSIS — S46012D Strain of muscle(s) and tendon(s) of the rotator cuff of left shoulder, subsequent encounter: Secondary | ICD-10-CM | POA: Diagnosis not present

## 2020-05-20 DIAGNOSIS — Z8673 Personal history of transient ischemic attack (TIA), and cerebral infarction without residual deficits: Secondary | ICD-10-CM | POA: Diagnosis not present

## 2020-05-23 DIAGNOSIS — Z8673 Personal history of transient ischemic attack (TIA), and cerebral infarction without residual deficits: Secondary | ICD-10-CM | POA: Diagnosis not present

## 2020-05-23 DIAGNOSIS — G8250 Quadriplegia, unspecified: Secondary | ICD-10-CM | POA: Diagnosis not present

## 2020-05-23 DIAGNOSIS — S46012D Strain of muscle(s) and tendon(s) of the rotator cuff of left shoulder, subsequent encounter: Secondary | ICD-10-CM | POA: Diagnosis not present

## 2020-05-23 DIAGNOSIS — E109 Type 1 diabetes mellitus without complications: Secondary | ICD-10-CM | POA: Diagnosis not present

## 2020-05-23 DIAGNOSIS — N200 Calculus of kidney: Secondary | ICD-10-CM | POA: Diagnosis not present

## 2020-05-27 ENCOUNTER — Ambulatory Visit (INDEPENDENT_AMBULATORY_CARE_PROVIDER_SITE_OTHER): Payer: Medicare Other | Admitting: *Deleted

## 2020-05-27 DIAGNOSIS — I63443 Cerebral infarction due to embolism of bilateral cerebellar arteries: Secondary | ICD-10-CM | POA: Diagnosis not present

## 2020-05-28 LAB — CUP PACEART REMOTE DEVICE CHECK
Date Time Interrogation Session: 20210620232308
Implantable Pulse Generator Implant Date: 20201203

## 2020-05-29 DIAGNOSIS — Z8673 Personal history of transient ischemic attack (TIA), and cerebral infarction without residual deficits: Secondary | ICD-10-CM | POA: Diagnosis not present

## 2020-05-29 DIAGNOSIS — S46012D Strain of muscle(s) and tendon(s) of the rotator cuff of left shoulder, subsequent encounter: Secondary | ICD-10-CM | POA: Diagnosis not present

## 2020-05-29 DIAGNOSIS — N200 Calculus of kidney: Secondary | ICD-10-CM | POA: Diagnosis not present

## 2020-05-29 DIAGNOSIS — G8250 Quadriplegia, unspecified: Secondary | ICD-10-CM | POA: Diagnosis not present

## 2020-05-29 DIAGNOSIS — E109 Type 1 diabetes mellitus without complications: Secondary | ICD-10-CM | POA: Diagnosis not present

## 2020-05-29 NOTE — Progress Notes (Signed)
[facility]link Summary Report / Loop Recorder 

## 2020-06-04 ENCOUNTER — Encounter: Payer: Self-pay | Admitting: Internal Medicine

## 2020-06-04 DIAGNOSIS — N200 Calculus of kidney: Secondary | ICD-10-CM | POA: Diagnosis not present

## 2020-06-04 DIAGNOSIS — G8250 Quadriplegia, unspecified: Secondary | ICD-10-CM | POA: Diagnosis not present

## 2020-06-04 DIAGNOSIS — Z8673 Personal history of transient ischemic attack (TIA), and cerebral infarction without residual deficits: Secondary | ICD-10-CM | POA: Diagnosis not present

## 2020-06-04 DIAGNOSIS — S46012D Strain of muscle(s) and tendon(s) of the rotator cuff of left shoulder, subsequent encounter: Secondary | ICD-10-CM | POA: Diagnosis not present

## 2020-06-04 DIAGNOSIS — E109 Type 1 diabetes mellitus without complications: Secondary | ICD-10-CM | POA: Diagnosis not present

## 2020-06-05 ENCOUNTER — Other Ambulatory Visit: Payer: Self-pay | Admitting: Internal Medicine

## 2020-06-05 NOTE — Telephone Encounter (Signed)
There is usually a form to be completed - with specific questions and requirements.  Also, he is probably going to need an office visit to discuss.  They usually need a face to face or virtual visit.  Need more information.

## 2020-06-08 DIAGNOSIS — G8220 Paraplegia, unspecified: Secondary | ICD-10-CM | POA: Diagnosis not present
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
## 2020-06-11 ENCOUNTER — Encounter: Payer: Self-pay | Admitting: Cardiovascular Disease

## 2020-06-11 ENCOUNTER — Ambulatory Visit (INDEPENDENT_AMBULATORY_CARE_PROVIDER_SITE_OTHER): Payer: Medicare Other | Admitting: Internal Medicine

## 2020-06-11 ENCOUNTER — Other Ambulatory Visit: Payer: Self-pay

## 2020-06-11 ENCOUNTER — Encounter: Payer: Self-pay | Admitting: Internal Medicine

## 2020-06-11 ENCOUNTER — Ambulatory Visit (INDEPENDENT_AMBULATORY_CARE_PROVIDER_SITE_OTHER): Payer: Medicare Other | Admitting: Cardiovascular Disease

## 2020-06-11 VITALS — BP 130/82 | HR 59 | Ht 72.0 in | Wt 180.0 lb

## 2020-06-11 DIAGNOSIS — I5032 Chronic diastolic (congestive) heart failure: Secondary | ICD-10-CM | POA: Diagnosis not present

## 2020-06-11 DIAGNOSIS — N200 Calculus of kidney: Secondary | ICD-10-CM | POA: Diagnosis not present

## 2020-06-11 DIAGNOSIS — Z959 Presence of cardiac and vascular implant and graft, unspecified: Secondary | ICD-10-CM

## 2020-06-11 DIAGNOSIS — R600 Localized edema: Secondary | ICD-10-CM

## 2020-06-11 DIAGNOSIS — I639 Cerebral infarction, unspecified: Secondary | ICD-10-CM | POA: Diagnosis not present

## 2020-06-11 DIAGNOSIS — I63443 Cerebral infarction due to embolism of bilateral cerebellar arteries: Secondary | ICD-10-CM | POA: Diagnosis not present

## 2020-06-11 DIAGNOSIS — G904 Autonomic dysreflexia: Secondary | ICD-10-CM | POA: Diagnosis not present

## 2020-06-11 DIAGNOSIS — R0601 Orthopnea: Secondary | ICD-10-CM

## 2020-06-11 DIAGNOSIS — S46012D Strain of muscle(s) and tendon(s) of the rotator cuff of left shoulder, subsequent encounter: Secondary | ICD-10-CM | POA: Diagnosis not present

## 2020-06-11 DIAGNOSIS — E109 Type 1 diabetes mellitus without complications: Secondary | ICD-10-CM | POA: Diagnosis not present

## 2020-06-11 DIAGNOSIS — R339 Retention of urine, unspecified: Secondary | ICD-10-CM | POA: Diagnosis not present

## 2020-06-11 DIAGNOSIS — Z8673 Personal history of transient ischemic attack (TIA), and cerebral infarction without residual deficits: Secondary | ICD-10-CM | POA: Diagnosis not present

## 2020-06-11 DIAGNOSIS — G8250 Quadriplegia, unspecified: Secondary | ICD-10-CM | POA: Diagnosis not present
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
## 2020-06-11 NOTE — Patient Instructions (Signed)
Medication Instructions:  - Your physician recommends that you continue on your current medications as directed. Please refer to the Current Medication list given to you today.  *If you need a refill on your cardiac medications before your next appointment, please call your pharmacy*   Lab Work: - none ordered  If you have labs (blood work) drawn today and your tests are completely normal, you will receive your results only by:  [place] (if you have MyChart) OR  A paper copy in the mail If you have any lab test that is abnormal or we need to change your treatment, we will call you to review the results.   Testing/Procedures: - none ordered   Follow-Up: At [facility], you and your health needs are our priority.  As part of our continuing mission to provide you with exceptional heart care, we have created designated Provider Care Teams.  These Care Teams include your primary Cardiologist (physician) and Advanced Practice Providers (APPs -  Physician Assistants and Nurse Practitioners) who all work together to provide you with the care you need, when you need it.  We recommend signing up for the patient portal called "MyChart".  Sign up information is provided on this After Visit Summary.  MyChart is used to connect with patients for Virtual Visits (Telemedicine).  Patients are able to view lab/test results, encounter notes, upcoming appointments, etc.  Non-urgent messages can be sent to your provider as well.   To learn more about what you can do with MyChart, go to [email or web address].    Your next appointment:   4 month(s)  The format for your next appointment:   In Person  Provider:   Virl Axe, MD   Other Instructions n/a

## 2020-06-11 NOTE — Patient Instructions (Signed)

## 2020-06-12 DIAGNOSIS — E109 Type 1 diabetes mellitus without complications: Secondary | ICD-10-CM | POA: Diagnosis not present

## 2020-06-12 DIAGNOSIS — Z794 Long term (current) use of insulin: Secondary | ICD-10-CM | POA: Diagnosis not present

## 2020-06-13 DIAGNOSIS — G8250 Quadriplegia, unspecified: Secondary | ICD-10-CM | POA: Diagnosis not present

## 2020-06-13 DIAGNOSIS — N200 Calculus of kidney: Secondary | ICD-10-CM | POA: Diagnosis not present

## 2020-06-13 DIAGNOSIS — S46012D Strain of muscle(s) and tendon(s) of the rotator cuff of left shoulder, subsequent encounter: Secondary | ICD-10-CM | POA: Diagnosis not present

## 2020-06-13 DIAGNOSIS — E109 Type 1 diabetes mellitus without complications: Secondary | ICD-10-CM | POA: Diagnosis not present

## 2020-06-13 DIAGNOSIS — Z8673 Personal history of transient ischemic attack (TIA), and cerebral infarction without residual deficits: Secondary | ICD-10-CM | POA: Diagnosis not present

## 2020-06-17 DIAGNOSIS — M75111 Incomplete rotator cuff tear or rupture of right shoulder, not specified as traumatic: Secondary | ICD-10-CM | POA: Diagnosis not present

## 2020-06-17 DIAGNOSIS — M24111 Other articular cartilage disorders, right shoulder: Secondary | ICD-10-CM | POA: Diagnosis not present

## 2020-06-17 DIAGNOSIS — M7581 Other shoulder lesions, right shoulder: Secondary | ICD-10-CM | POA: Diagnosis not present

## 2020-06-17 DIAGNOSIS — S46101D Unspecified injury of muscle, fascia and tendon of long head of biceps, right arm, subsequent encounter: Secondary | ICD-10-CM | POA: Diagnosis not present

## 2020-06-18 DIAGNOSIS — G8250 Quadriplegia, unspecified: Secondary | ICD-10-CM | POA: Diagnosis not present

## 2020-06-18 DIAGNOSIS — S46012D Strain of muscle(s) and tendon(s) of the rotator cuff of left shoulder, subsequent encounter: Secondary | ICD-10-CM | POA: Diagnosis not present

## 2020-06-18 DIAGNOSIS — N200 Calculus of kidney: Secondary | ICD-10-CM | POA: Diagnosis not present

## 2020-06-18 DIAGNOSIS — E109 Type 1 diabetes mellitus without complications: Secondary | ICD-10-CM | POA: Diagnosis not present

## 2020-06-18 DIAGNOSIS — Z8673 Personal history of transient ischemic attack (TIA), and cerebral infarction without residual deficits: Secondary | ICD-10-CM | POA: Diagnosis not present

## 2020-06-20 DIAGNOSIS — G8250 Quadriplegia, unspecified: Secondary | ICD-10-CM | POA: Diagnosis not present

## 2020-06-20 DIAGNOSIS — S46012D Strain of muscle(s) and tendon(s) of the rotator cuff of left shoulder, subsequent encounter: Secondary | ICD-10-CM | POA: Diagnosis not present

## 2020-06-20 DIAGNOSIS — Z8673 Personal history of transient ischemic attack (TIA), and cerebral infarction without residual deficits: Secondary | ICD-10-CM | POA: Diagnosis not present

## 2020-06-20 DIAGNOSIS — N200 Calculus of kidney: Secondary | ICD-10-CM | POA: Diagnosis not present

## 2020-06-20 DIAGNOSIS — E109 Type 1 diabetes mellitus without complications: Secondary | ICD-10-CM | POA: Diagnosis not present

## 2020-06-25 DIAGNOSIS — R609 Edema, unspecified: Secondary | ICD-10-CM | POA: Diagnosis not present

## 2020-06-26 ENCOUNTER — Telehealth: Payer: Self-pay | Admitting: Cardiovascular Disease

## 2020-06-26 NOTE — Telephone Encounter (Signed)
Pt c/o swelling: STAT is pt has developed SOB within 24 hours  1) How much weight have you gained and in what time span? Yes not sure how much   2) If swelling, where is the swelling located?  LLE started about a week ago testicular swelling x 2 days   3) Are you currently taking a fluid pill?  yes  4) Are you currently SOB? No   5) Do you have a log of your daily weights (if so, list)?no unable as quadriplegic   6) Have you gained 3 pounds in a day or 5 pounds in a week? Unsure but appears to have weight gain feels like fluid  7) Have you traveled recently? No but wheelchair bound

## 2020-06-26 NOTE — Telephone Encounter (Signed)
Spoke with patient and he states that legs are normally swollen. He states that his nut sack is now swollen. He reports that he did get new bed which he is able to elevate his legs and lower his head and that the testicles did increase since that change. He also reports taking Furosemide 20 mg 1 to 2 tablets per day if swelling is increased. He states that he was going to wait on labs with PCP but now he is calling due to the testicle swelling. I did review how body positions can use increased swelling and to limit those positions. Also reviewed that frequent increased doses of furosemide can cause electrolyte imbalances and we should check with provider. He is not scheduled to see his PCP until September so he wanted to see if he should have that done sooner. Advised that I would forward to provider for review and recommendations and will call him back with that information. He verbalized understanding with no further questions at this time.

## 2020-06-27 ENCOUNTER — Encounter: Payer: Self-pay | Admitting: Internal Medicine

## 2020-06-27 DIAGNOSIS — E109 Type 1 diabetes mellitus without complications: Secondary | ICD-10-CM | POA: Diagnosis not present

## 2020-06-27 DIAGNOSIS — S46012D Strain of muscle(s) and tendon(s) of the rotator cuff of left shoulder, subsequent encounter: Secondary | ICD-10-CM | POA: Diagnosis not present

## 2020-06-27 DIAGNOSIS — N200 Calculus of kidney: Secondary | ICD-10-CM | POA: Diagnosis not present

## 2020-06-27 DIAGNOSIS — G8250 Quadriplegia, unspecified: Secondary | ICD-10-CM | POA: Diagnosis not present

## 2020-06-27 DIAGNOSIS — Z8673 Personal history of transient ischemic attack (TIA), and cerebral infarction without residual deficits: Secondary | ICD-10-CM | POA: Diagnosis not present

## 2020-06-27 NOTE — Telephone Encounter (Signed)
If the scrotal swelling is from fluid retention, Intake has exceeded output He did report high water intake, beers daily, high daily input Lasix 20 daily may not be adequate We need BMP ASAP given potassium is low, sodium was trending lower on lab work several months ago Lasix 40 daily with cutting back on fluid intake to start. Output needs to exceed input until symptoms resolve

## 2020-06-28 ENCOUNTER — Other Ambulatory Visit: Payer: Self-pay | Admitting: *Deleted

## 2020-06-28 DIAGNOSIS — I5032 Chronic diastolic (congestive) heart failure: Secondary | ICD-10-CM

## 2020-06-28 DIAGNOSIS — Z79899 Other long term (current) drug therapy: Secondary | ICD-10-CM

## 2020-07-01 ENCOUNTER — Ambulatory Visit
Admission: RE | Admit: 2020-07-01 | Discharge: 2020-07-01 | Disposition: A | Discharge status: Home / Self Care | Payer: Medicare Other | Attending: Cardiovascular Disease | Admitting: Cardiovascular Disease

## 2020-07-01 ENCOUNTER — Telehealth: Payer: Self-pay | Admitting: Internal Medicine

## 2020-07-01 DIAGNOSIS — Z79899 Other long term (current) drug therapy: Secondary | ICD-10-CM | POA: Diagnosis not present

## 2020-07-01 DIAGNOSIS — I5032 Chronic diastolic (congestive) heart failure: Secondary | ICD-10-CM | POA: Diagnosis not present

## 2020-07-01 LAB — BASIC METABOLIC PANEL
Anion gap: 6 (ref 5–15)
BUN: 10 mg/dL (ref 6–20)
CO2: 30 mmol/L (ref 22–32)
Calcium: 8.4 mg/dL — ABNORMAL LOW (ref 8.9–10.3)
Chloride: 100 mmol/L (ref 98–111)
Creatinine, Ser: 0.64 mg/dL (ref 0.61–1.24)
GFR calc Af Amer: >60 mL/min (ref >60)
GFR calc non Af Amer: >60 mL/min (ref >60)
Glucose, Bld: 90 mg/dL (ref 70–99)
Potassium: 4 mmol/L (ref 3.5–5.1)
Sodium: 136 mmol/L (ref 135–145)

## 2020-07-01 NOTE — Telephone Encounter (Signed)
-----   Message from Deboraha Sprang, MD sent at 06/11/2020  9:41 AM EDT ----- Hope you and your family well this summer  Two things 1)  loop shows nocturnal pauses  almost certainly 2/2 OSA  if you think appropriate>> sleep study 2) he was going to reachout to you for a "referral" for his insurance to pay for a motorization tool for his wheel chair   [name]

## 2020-07-01 NOTE — Telephone Encounter (Signed)
Dr Caryl Comes had messaged me regarding Steve's monitor showing some pauses.  Had recommended further evaluation with sleep study. Also Dr Caryl Comes had stated he was going to reach out for insurance to pay for a motorization tool for his wheel chair.  I am not sure we ever received.  Please confirm with pt if desires further w/up for sleep apnea ( with referral to pulmonary).  And also , did we receive anything regarding his wheelchair?

## 2020-07-01 NOTE — Telephone Encounter (Signed)
Call to patient to review advice from Dr. Rockey Situ.   Pt reports that he has been conversation with Dr. Rockey Situ via Deloris Ping and he was already aware of this information.   He is just waiting on lab results at this time.

## 2020-07-04 DIAGNOSIS — E109 Type 1 diabetes mellitus without complications: Secondary | ICD-10-CM | POA: Diagnosis not present

## 2020-07-04 DIAGNOSIS — S46012D Strain of muscle(s) and tendon(s) of the rotator cuff of left shoulder, subsequent encounter: Secondary | ICD-10-CM | POA: Diagnosis not present

## 2020-07-04 DIAGNOSIS — N200 Calculus of kidney: Secondary | ICD-10-CM | POA: Diagnosis not present

## 2020-07-04 DIAGNOSIS — Z8673 Personal history of transient ischemic attack (TIA), and cerebral infarction without residual deficits: Secondary | ICD-10-CM | POA: Diagnosis not present

## 2020-07-04 DIAGNOSIS — G8250 Quadriplegia, unspecified: Secondary | ICD-10-CM | POA: Diagnosis not present

## 2020-07-04 NOTE — Telephone Encounter (Signed)
LMTCB

## 2020-07-04 NOTE — Telephone Encounter (Signed)
Pt returned your call.  

## 2020-07-09 DIAGNOSIS — G8220 Paraplegia, unspecified: Secondary | ICD-10-CM | POA: Diagnosis not present

## 2020-07-11 ENCOUNTER — Encounter: Payer: Self-pay | Admitting: Internal Medicine

## 2020-07-11 DIAGNOSIS — N200 Calculus of kidney: Secondary | ICD-10-CM | POA: Diagnosis not present

## 2020-07-11 DIAGNOSIS — Z8673 Personal history of transient ischemic attack (TIA), and cerebral infarction without residual deficits: Secondary | ICD-10-CM | POA: Diagnosis not present

## 2020-07-11 DIAGNOSIS — S46012D Strain of muscle(s) and tendon(s) of the rotator cuff of left shoulder, subsequent encounter: Secondary | ICD-10-CM | POA: Diagnosis not present

## 2020-07-11 DIAGNOSIS — G8250 Quadriplegia, unspecified: Secondary | ICD-10-CM | POA: Diagnosis not present

## 2020-07-11 DIAGNOSIS — E109 Type 1 diabetes mellitus without complications: Secondary | ICD-10-CM | POA: Diagnosis not present

## 2020-07-11 NOTE — Telephone Encounter (Signed)
Mychart message sent.

## 2020-07-12 DIAGNOSIS — G8250 Quadriplegia, unspecified: Secondary | ICD-10-CM | POA: Diagnosis not present

## 2020-07-12 DIAGNOSIS — E109 Type 1 diabetes mellitus without complications: Secondary | ICD-10-CM | POA: Diagnosis not present

## 2020-07-12 DIAGNOSIS — R339 Retention of urine, unspecified: Secondary | ICD-10-CM | POA: Diagnosis not present

## 2020-07-15 ENCOUNTER — Other Ambulatory Visit: Payer: Self-pay | Admitting: *Deleted

## 2020-07-15 DIAGNOSIS — I5032 Chronic diastolic (congestive) heart failure: Secondary | ICD-10-CM

## 2020-07-15 MED ORDER — FUROSEMIDE 20 MG PO TABS: 20.0000 mg | ORAL_TABLET | ORAL | 3 refills | Status: DC

## 2020-07-15 NOTE — Telephone Encounter (Signed)
Noted.  FYI for Trisha

## 2020-07-18 DIAGNOSIS — G8250 Quadriplegia, unspecified: Secondary | ICD-10-CM | POA: Diagnosis not present

## 2020-07-18 DIAGNOSIS — N200 Calculus of kidney: Secondary | ICD-10-CM | POA: Diagnosis not present

## 2020-07-18 DIAGNOSIS — S46012D Strain of muscle(s) and tendon(s) of the rotator cuff of left shoulder, subsequent encounter: Secondary | ICD-10-CM | POA: Diagnosis not present

## 2020-07-18 DIAGNOSIS — Z8673 Personal history of transient ischemic attack (TIA), and cerebral infarction without residual deficits: Secondary | ICD-10-CM | POA: Diagnosis not present

## 2020-07-18 DIAGNOSIS — E109 Type 1 diabetes mellitus without complications: Secondary | ICD-10-CM | POA: Diagnosis not present

## 2020-07-18 NOTE — Telephone Encounter (Signed)
Can we find out how much Lasix he is taking We will likely need to transition him to torsemide Would stop all beer Decrease his fluid intake Looking at his medication list, he is really not on any cardiac medications that would contribute to fluid retention Only on midodrine and Lasix if my list is correct Elevated blood pressure could be from midodrine If he is on midodrine we would likely need to stop this

## 2020-07-23 ENCOUNTER — Other Ambulatory Visit: Payer: Self-pay | Admitting: *Deleted

## 2020-07-23 MED ORDER — HYDRALAZINE HCL 25 MG PO TABS: 25.0000 mg | ORAL_TABLET | Freq: Three times a day (TID) | ORAL | 0 refills | Status: DC | PRN

## 2020-07-25 DIAGNOSIS — E109 Type 1 diabetes mellitus without complications: Secondary | ICD-10-CM | POA: Diagnosis not present

## 2020-07-25 DIAGNOSIS — Z8673 Personal history of transient ischemic attack (TIA), and cerebral infarction without residual deficits: Secondary | ICD-10-CM | POA: Diagnosis not present

## 2020-07-25 DIAGNOSIS — G8250 Quadriplegia, unspecified: Secondary | ICD-10-CM | POA: Diagnosis not present

## 2020-07-25 DIAGNOSIS — S46012D Strain of muscle(s) and tendon(s) of the rotator cuff of left shoulder, subsequent encounter: Secondary | ICD-10-CM | POA: Diagnosis not present

## 2020-07-25 DIAGNOSIS — N200 Calculus of kidney: Secondary | ICD-10-CM | POA: Diagnosis not present

## 2020-07-25 NOTE — Telephone Encounter (Signed)
Pt called to check on paperwork for wheelchair add on. Pt asked for this to be put on high priority.

## 2020-07-29 DIAGNOSIS — S46012D Strain of muscle(s) and tendon(s) of the rotator cuff of left shoulder, subsequent encounter: Secondary | ICD-10-CM | POA: Diagnosis not present

## 2020-07-29 DIAGNOSIS — L97412 Non-pressure chronic ulcer of right heel and midfoot with fat layer exposed: Secondary | ICD-10-CM | POA: Diagnosis not present

## 2020-07-29 DIAGNOSIS — Z794 Long term (current) use of insulin: Secondary | ICD-10-CM | POA: Diagnosis not present

## 2020-07-29 DIAGNOSIS — M7582 Other shoulder lesions, left shoulder: Secondary | ICD-10-CM | POA: Diagnosis not present

## 2020-07-29 DIAGNOSIS — L97522 Non-pressure chronic ulcer of other part of left foot with fat layer exposed: Secondary | ICD-10-CM | POA: Diagnosis not present

## 2020-07-29 DIAGNOSIS — E1140 Type 2 diabetes mellitus with diabetic neuropathy, unspecified: Secondary | ICD-10-CM | POA: Diagnosis not present

## 2020-07-31 DIAGNOSIS — S46012D Strain of muscle(s) and tendon(s) of the rotator cuff of left shoulder, subsequent encounter: Secondary | ICD-10-CM | POA: Diagnosis not present

## 2020-07-31 DIAGNOSIS — N200 Calculus of kidney: Secondary | ICD-10-CM | POA: Diagnosis not present

## 2020-07-31 DIAGNOSIS — E109 Type 1 diabetes mellitus without complications: Secondary | ICD-10-CM | POA: Diagnosis not present

## 2020-07-31 DIAGNOSIS — G8250 Quadriplegia, unspecified: Secondary | ICD-10-CM | POA: Diagnosis not present

## 2020-07-31 DIAGNOSIS — Z8673 Personal history of transient ischemic attack (TIA), and cerebral infarction without residual deficits: Secondary | ICD-10-CM | POA: Diagnosis not present

## 2020-08-04 NOTE — Telephone Encounter (Signed)
Because of the sores on his feet I am unclear Leg swelling can come from different causes such as anemia, low albumin, renal dysfunction,  Diuretics may try to improve symptoms but will not fix underlying issue  Excess fluid intake can typically be relieved with diuretics Ideally need a new CBC, I would rule out anemia CMP would look at albumin level  If it is fluid excess we can change his Lasix to torsemide 20 twice daily He might need urine studies to look for protein in his urine/nephrotic syndrome. I would defer this to primary care or nephrology  Certainly would use the Ace wraps especially if unable to place compression hose Do not sit with legs down without some kind of wrap Consider Velcro wraps which may be easier to get on and off

## 2020-08-05 ENCOUNTER — Other Ambulatory Visit: Payer: Self-pay | Admitting: Internal Medicine

## 2020-08-05 NOTE — Telephone Encounter (Signed)
[name], she my chart messages.  He is having increased swelling, etc. Seeing podiatry for foot ulcerations.  Per review of chart, he has an appt with me 08/08/20.  See me about this.  Need to get him scheduled for f/u labs, etc.

## 2020-08-06 NOTE — Telephone Encounter (Signed)
Yes.  He does need labs.

## 2020-08-06 NOTE — Telephone Encounter (Signed)
Do you want to do labs on him prior to his appt with you on Thursday or are you ok to order labs at virtual appt?

## 2020-08-07 DIAGNOSIS — S46012D Strain of muscle(s) and tendon(s) of the rotator cuff of left shoulder, subsequent encounter: Secondary | ICD-10-CM | POA: Diagnosis not present

## 2020-08-07 DIAGNOSIS — E109 Type 1 diabetes mellitus without complications: Secondary | ICD-10-CM | POA: Diagnosis not present

## 2020-08-07 DIAGNOSIS — G8250 Quadriplegia, unspecified: Secondary | ICD-10-CM | POA: Diagnosis not present

## 2020-08-07 DIAGNOSIS — Z8673 Personal history of transient ischemic attack (TIA), and cerebral infarction without residual deficits: Secondary | ICD-10-CM | POA: Diagnosis not present

## 2020-08-07 DIAGNOSIS — N390 Urinary tract infection, site not specified: Secondary | ICD-10-CM | POA: Diagnosis not present

## 2020-08-07 DIAGNOSIS — N200 Calculus of kidney: Secondary | ICD-10-CM | POA: Diagnosis not present

## 2020-08-07 DIAGNOSIS — R531 Weakness: Secondary | ICD-10-CM | POA: Diagnosis not present

## 2020-08-07 NOTE — Telephone Encounter (Signed)
Called patient. He is unable to come in for labs today or tomorrow due to no transportation. He is going to let me know at appt tomorrow if he is going to be able to come in Friday for labs. Advised that he needs to have labs done this week if possible.

## 2020-08-08 ENCOUNTER — Other Ambulatory Visit: Payer: Self-pay

## 2020-08-08 ENCOUNTER — Telehealth (INDEPENDENT_AMBULATORY_CARE_PROVIDER_SITE_OTHER): Payer: Medicare Other | Admitting: Internal Medicine

## 2020-08-08 DIAGNOSIS — R030 Elevated blood-pressure reading, without diagnosis of hypertension: Secondary | ICD-10-CM

## 2020-08-08 DIAGNOSIS — G8220 Paraplegia, unspecified: Secondary | ICD-10-CM

## 2020-08-08 DIAGNOSIS — E871 Hypo-osmolality and hyponatremia: Secondary | ICD-10-CM | POA: Diagnosis not present

## 2020-08-08 DIAGNOSIS — E1069 Type 1 diabetes mellitus with other specified complication: Secondary | ICD-10-CM | POA: Diagnosis not present

## 2020-08-08 DIAGNOSIS — L98491 Non-pressure chronic ulcer of skin of other sites limited to breakdown of skin: Secondary | ICD-10-CM | POA: Diagnosis not present

## 2020-08-08 DIAGNOSIS — D62 Acute posthemorrhagic anemia: Secondary | ICD-10-CM
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
## 2020-08-09 DIAGNOSIS — G8220 Paraplegia, unspecified: Secondary | ICD-10-CM | POA: Diagnosis not present

## 2020-08-12 ENCOUNTER — Encounter: Payer: Self-pay | Admitting: Internal Medicine

## 2020-08-12 DIAGNOSIS — Z125 Encounter for screening for malignant neoplasm of prostate: Secondary | ICD-10-CM

## 2020-08-12 DIAGNOSIS — E1069 Type 1 diabetes mellitus with other specified complication: Secondary | ICD-10-CM

## 2020-08-12 DIAGNOSIS — D62 Acute posthemorrhagic anemia: Secondary | ICD-10-CM

## 2020-08-13 NOTE — Telephone Encounter (Signed)
Called and scheduled pt for labs  No labs in system

## 2020-08-13 NOTE — Telephone Encounter (Signed)
Are you just wanting to do routine fasting labs?

## 2020-08-14 ENCOUNTER — Other Ambulatory Visit (INDEPENDENT_AMBULATORY_CARE_PROVIDER_SITE_OTHER): Payer: Medicare Other

## 2020-08-14 ENCOUNTER — Other Ambulatory Visit: Payer: Self-pay

## 2020-08-14 DIAGNOSIS — N200 Calculus of kidney: Secondary | ICD-10-CM | POA: Diagnosis not present

## 2020-08-14 DIAGNOSIS — Z8673 Personal history of transient ischemic attack (TIA), and cerebral infarction without residual deficits: Secondary | ICD-10-CM | POA: Diagnosis not present

## 2020-08-14 DIAGNOSIS — E109 Type 1 diabetes mellitus without complications: Secondary | ICD-10-CM | POA: Diagnosis not present

## 2020-08-14 DIAGNOSIS — D62 Acute posthemorrhagic anemia: Secondary | ICD-10-CM | POA: Diagnosis not present

## 2020-08-14 DIAGNOSIS — Z125 Encounter for screening for malignant neoplasm of prostate: Secondary | ICD-10-CM | POA: Diagnosis not present

## 2020-08-14 DIAGNOSIS — G8250 Quadriplegia, unspecified: Secondary | ICD-10-CM | POA: Diagnosis not present

## 2020-08-14 DIAGNOSIS — E1069 Type 1 diabetes mellitus with other specified complication: Secondary | ICD-10-CM | POA: Diagnosis not present

## 2020-08-14 DIAGNOSIS — S46012D Strain of muscle(s) and tendon(s) of the rotator cuff of left shoulder, subsequent encounter: Secondary | ICD-10-CM | POA: Diagnosis not present

## 2020-08-14 LAB — CBC WITH DIFFERENTIAL/PLATELET
Basophils Absolute: 0 10*3/uL (ref 0.0–0.1)
Basophils Relative: 1 % (ref 0.0–3.0)
Eosinophils Absolute: 0.1 10*3/uL (ref 0.0–0.7)
Eosinophils Relative: 1.9 % (ref 0.0–5.0)
HCT: 31.5 % — ABNORMAL LOW (ref 39.0–52.0)
Hemoglobin: 10.5 g/dL — ABNORMAL LOW (ref 13.0–17.0)
Lymphocytes Relative: 21.4 % (ref 12.0–46.0)
Lymphs Abs: 0.7 10*3/uL (ref 0.7–4.0)
MCHC: 33.4 g/dL (ref 30.0–36.0)
MCV: 90.3 fl (ref 78.0–100.0)
Monocytes Absolute: 0.3 10*3/uL (ref 0.1–1.0)
Monocytes Relative: 8.3 % (ref 3.0–12.0)
Neutro Abs: 2.1 10*3/uL (ref 1.4–7.7)
Neutrophils Relative %: 67.4 % (ref 43.0–77.0)
Platelets: 175 10*3/uL (ref 150.0–400.0)
RBC: 3.48 Mil/uL — ABNORMAL LOW (ref 4.22–5.81)
RDW: 16.2 % — ABNORMAL HIGH (ref 11.5–15.5)
WBC: 3.2 10*3/uL — ABNORMAL LOW (ref 4.0–10.5)

## 2020-08-14 LAB — IBC + FERRITIN
Ferritin: 10.4 ng/mL — ABNORMAL LOW (ref 22.0–322.0)
Iron: 89 ug/dL (ref 42–165)
Saturation Ratios: 22.5 % (ref 20.0–50.0)
Transferrin: 283 mg/dL (ref 212.0–360.0)

## 2020-08-14 LAB — BASIC METABOLIC PANEL
BUN: 11 mg/dL (ref 6–23)
CO2: 30 mEq/L (ref 19–32)
Calcium: 8.4 mg/dL (ref 8.4–10.5)
Chloride: 100 mEq/L (ref 96–112)
Creatinine, Ser: 0.67 mg/dL (ref 0.40–1.50)
GFR: 125.35 mL/min (ref >60.00)
Glucose, Bld: 143 mg/dL — ABNORMAL HIGH (ref 70–99)
Potassium: 4.1 mEq/L (ref 3.5–5.1)
Sodium: 136 mEq/L (ref 135–145)

## 2020-08-14 LAB — LIPID PANEL
Cholesterol: 129 mg/dL (ref 0–200)
HDL: 58.3 mg/dL (ref >39.00)
LDL Cholesterol: 60 mg/dL (ref 0–99)
NonHDL: 71.19
Total CHOL/HDL Ratio: 2
Triglycerides: 57 mg/dL (ref 0.0–149.0)
VLDL: 11.4 mg/dL (ref 0.0–40.0)

## 2020-08-14 LAB — HEPATIC FUNCTION PANEL
ALT: 10 U/L (ref 0–53)
AST: 13 U/L (ref 0–37)
Albumin: 3.5 g/dL (ref 3.5–5.2)
Alkaline Phosphatase: 73 U/L (ref 39–117)
Bilirubin, Direct: 0.1 mg/dL (ref 0.0–0.3)
Total Bilirubin: 0.4 mg/dL (ref 0.2–1.2)
Total Protein: 5.2 g/dL — ABNORMAL LOW (ref 6.0–8.3)

## 2020-08-14 LAB — PSA: PSA: 0.24 ng/mL (ref 0.10–4.00)

## 2020-08-14 LAB — HEMOGLOBIN A1C: Hgb A1c MFr Bld: 6 % (ref 4.6–6.5)

## 2020-08-14 LAB — TSH: TSH: 2.88 u[IU]/mL (ref 0.35–4.50)

## 2020-08-14 NOTE — Telephone Encounter (Signed)
Labs are ordered 

## 2020-08-14 NOTE — Addendum Note (Signed)
Addended by: Leeanne Rio on: 08/14/2020 11:10 AM   Modules accepted: Orders

## 2020-08-14 NOTE — Telephone Encounter (Signed)
Noted  

## 2020-08-16 ENCOUNTER — Encounter: Payer: Self-pay | Admitting: *Deleted

## 2020-08-18 ENCOUNTER — Telehealth: Payer: Self-pay | Admitting: Internal Medicine

## 2020-08-18 ENCOUNTER — Encounter: Payer: Self-pay | Admitting: Internal Medicine

## 2020-08-18 NOTE — Assessment & Plan Note (Signed)
Has had some intermittent episodes.  Followed by cardiology.

## 2020-08-18 NOTE — Telephone Encounter (Signed)
My chart message sent to pt to reschedule testosterone lab.

## 2020-08-18 NOTE — Assessment & Plan Note (Signed)
He is following sugars regularly.  Adjusting insulin to avoid low sugars.  Follow met b and a1c.

## 2020-08-18 NOTE — Assessment & Plan Note (Signed)
Follow cbc.  

## 2020-08-18 NOTE — Assessment & Plan Note (Signed)
Being followed by podiatry for foot ulcer.

## 2020-08-18 NOTE — Assessment & Plan Note (Signed)
Has stopped drinking.  Recheck sodium with next labs.

## 2020-08-18 NOTE — Assessment & Plan Note (Signed)
Recent shoulder surgeries as outlined.  Unable to transfer or doing any lifting with his arm.  Just working on rom now.  Unable to propel a manual wheelchair.  Continues to need - power assist - wheelchair.  Has 24/7 aid to help with ADLs.  Continue f/u with ortho.

## 2020-08-19 ENCOUNTER — Telehealth: Payer: Self-pay | Admitting: Internal Medicine

## 2020-08-19 ENCOUNTER — Ambulatory Visit: Payer: Medicare Other

## 2020-08-19 NOTE — Telephone Encounter (Signed)
Raquel Sarna from [employer] called about insurance on patient visit notes her contact number is [phone]

## 2020-08-20 DIAGNOSIS — R339 Retention of urine, unspecified: Secondary | ICD-10-CM | POA: Diagnosis not present

## 2020-08-20 DIAGNOSIS — G8250 Quadriplegia, unspecified: Secondary | ICD-10-CM | POA: Diagnosis not present

## 2020-08-20 DIAGNOSIS — E109 Type 1 diabetes mellitus without complications: Secondary | ICD-10-CM | POA: Diagnosis not present

## 2020-08-21 ENCOUNTER — Other Ambulatory Visit: Payer: Self-pay | Admitting: Internal Medicine

## 2020-08-21 DIAGNOSIS — D62 Acute posthemorrhagic anemia: Secondary | ICD-10-CM

## 2020-08-21 DIAGNOSIS — N200 Calculus of kidney: Secondary | ICD-10-CM | POA: Diagnosis not present

## 2020-08-21 DIAGNOSIS — Z8673 Personal history of transient ischemic attack (TIA), and cerebral infarction without residual deficits: Secondary | ICD-10-CM | POA: Diagnosis not present

## 2020-08-21 DIAGNOSIS — S46012D Strain of muscle(s) and tendon(s) of the rotator cuff of left shoulder, subsequent encounter: Secondary | ICD-10-CM | POA: Diagnosis not present

## 2020-08-21 DIAGNOSIS — G8250 Quadriplegia, unspecified: Secondary | ICD-10-CM | POA: Diagnosis not present

## 2020-08-21 DIAGNOSIS — E109 Type 1 diabetes mellitus without complications: Secondary | ICD-10-CM | POA: Diagnosis not present

## 2020-08-21 NOTE — Progress Notes (Signed)
Order placed for GI referral.   

## 2020-08-23 ENCOUNTER — Encounter: Payer: Self-pay | Admitting: Internal Medicine

## 2020-08-26 DIAGNOSIS — L97411 Non-pressure chronic ulcer of right heel and midfoot limited to breakdown of skin: Secondary | ICD-10-CM | POA: Diagnosis not present

## 2020-08-26 DIAGNOSIS — E1140 Type 2 diabetes mellitus with diabetic neuropathy, unspecified: Secondary | ICD-10-CM | POA: Diagnosis not present

## 2020-08-26 DIAGNOSIS — L97522 Non-pressure chronic ulcer of other part of left foot with fat layer exposed: Secondary | ICD-10-CM | POA: Diagnosis not present

## 2020-08-26 DIAGNOSIS — Z794 Long term (current) use of insulin: Secondary | ICD-10-CM | POA: Diagnosis not present

## 2020-08-26 NOTE — Telephone Encounter (Signed)
Faxed to [place] at [employer]

## 2020-08-28 DIAGNOSIS — S46012D Strain of muscle(s) and tendon(s) of the rotator cuff of left shoulder, subsequent encounter: Secondary | ICD-10-CM | POA: Diagnosis not present

## 2020-08-28 DIAGNOSIS — N200 Calculus of kidney: Secondary | ICD-10-CM | POA: Diagnosis not present

## 2020-08-28 DIAGNOSIS — E109 Type 1 diabetes mellitus without complications: Secondary | ICD-10-CM | POA: Diagnosis not present

## 2020-08-28 DIAGNOSIS — Z8673 Personal history of transient ischemic attack (TIA), and cerebral infarction without residual deficits: Secondary | ICD-10-CM | POA: Diagnosis not present

## 2020-08-28 DIAGNOSIS — G8250 Quadriplegia, unspecified: Secondary | ICD-10-CM | POA: Diagnosis not present

## 2020-08-30 NOTE — Telephone Encounter (Signed)
Raquel Sarna from [employer] called [phone] for power assist to patient's manual wheelchair

## 2020-09-04 ENCOUNTER — Other Ambulatory Visit: Payer: Self-pay | Admitting: Internal Medicine

## 2020-09-04 DIAGNOSIS — G8250 Quadriplegia, unspecified: Secondary | ICD-10-CM | POA: Diagnosis not present

## 2020-09-04 DIAGNOSIS — E109 Type 1 diabetes mellitus without complications: Secondary | ICD-10-CM | POA: Diagnosis not present

## 2020-09-04 DIAGNOSIS — N200 Calculus of kidney: Secondary | ICD-10-CM | POA: Diagnosis not present

## 2020-09-04 DIAGNOSIS — Z8673 Personal history of transient ischemic attack (TIA), and cerebral infarction without residual deficits: Secondary | ICD-10-CM | POA: Diagnosis not present

## 2020-09-04 DIAGNOSIS — S46012D Strain of muscle(s) and tendon(s) of the rotator cuff of left shoulder, subsequent encounter: Secondary | ICD-10-CM | POA: Diagnosis not present

## 2020-09-04 NOTE — Telephone Encounter (Signed)
Spoke with Raquel Sarna and refaxed note.

## 2020-09-06 ENCOUNTER — Ambulatory Visit (INDEPENDENT_AMBULATORY_CARE_PROVIDER_SITE_OTHER): Payer: Medicare Other

## 2020-09-06 VITALS — Ht 72.0 in | Wt 180.0 lb

## 2020-09-06 DIAGNOSIS — G8250 Quadriplegia, unspecified: Secondary | ICD-10-CM | POA: Diagnosis not present

## 2020-09-06 DIAGNOSIS — Z0000 Encounter for general adult medical examination without abnormal findings: Secondary | ICD-10-CM | POA: Diagnosis not present

## 2020-09-06 DIAGNOSIS — R339 Retention of urine, unspecified: Secondary | ICD-10-CM | POA: Diagnosis not present

## 2020-09-06 DIAGNOSIS — E109 Type 1 diabetes mellitus without complications: Secondary | ICD-10-CM | POA: Diagnosis not present
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
## 2020-09-06 NOTE — Patient Instructions (Addendum)
  Mr. [name] , Thank you for taking time to come for your Medicare Wellness Visit. I appreciate your ongoing commitment to your health goals. Please review the following plan we discussed and let me know if I can assist you in the future.   These are the goals we discussed: Goals    . Follow up with primary care provider as needed       This is a list of the screening recommended for you and due dates:  Health Maintenance  Topic Date Due  . Eye exam for diabetics  09/06/2020*  . Flu Shot  03/06/2021*  . Pneumococcal vaccine  09/06/2021*  . Tetanus Vaccine  09/06/2021*  .  Hepatitis C: One time screening is recommended by Center for Disease Control  (CDC) for  adults born from [birth year] through 1966.   09/06/2021*  . COVID-19 Vaccine (2 - Moderna 2-dose series) 09/16/2020  . Hemoglobin A1C  02/11/2021  . Complete foot exam   08/26/2021  . Colon Cancer Screening  05/03/2028  . HIV Screening  Completed  *Topic was postponed. The date shown is not the original due date.   Immunizations     Immunization History  Administered Date(s) Administered  . Influenza,inj,Quad PF,6+ Mos 12/20/2015, 08/06/2017, 12/22/2018, 10/24/2019  . Moderna SARS-COVID-2 Vaccination 08/19/2020

## 2020-09-09 ENCOUNTER — Ambulatory Visit: Payer: Medicare Other

## 2020-09-10 DIAGNOSIS — E109 Type 1 diabetes mellitus without complications: Secondary | ICD-10-CM | POA: Diagnosis not present

## 2020-09-10 DIAGNOSIS — G8250 Quadriplegia, unspecified: Secondary | ICD-10-CM | POA: Diagnosis not present

## 2020-09-10 DIAGNOSIS — N200 Calculus of kidney: Secondary | ICD-10-CM | POA: Diagnosis not present

## 2020-09-10 DIAGNOSIS — S46012D Strain of muscle(s) and tendon(s) of the rotator cuff of left shoulder, subsequent encounter: Secondary | ICD-10-CM | POA: Diagnosis not present

## 2020-09-10 DIAGNOSIS — Z8673 Personal history of transient ischemic attack (TIA), and cerebral infarction without residual deficits: Secondary | ICD-10-CM | POA: Diagnosis not present

## 2020-09-13 DIAGNOSIS — S46101D Unspecified injury of muscle, fascia and tendon of long head of biceps, right arm, subsequent encounter: Secondary | ICD-10-CM | POA: Diagnosis not present

## 2020-09-13 DIAGNOSIS — M7582 Other shoulder lesions, left shoulder: Secondary | ICD-10-CM | POA: Diagnosis not present

## 2020-09-13 DIAGNOSIS — S46012D Strain of muscle(s) and tendon(s) of the rotator cuff of left shoulder, subsequent encounter: Secondary | ICD-10-CM | POA: Diagnosis not present

## 2020-09-16 DIAGNOSIS — N399 Disorder of urinary system, unspecified: Secondary | ICD-10-CM | POA: Diagnosis not present

## 2020-09-16 DIAGNOSIS — N390 Urinary tract infection, site not specified: Secondary | ICD-10-CM | POA: Diagnosis not present

## 2020-09-16 DIAGNOSIS — N319 Neuromuscular dysfunction of bladder, unspecified: Secondary | ICD-10-CM | POA: Diagnosis not present

## 2020-09-16 DIAGNOSIS — Z794 Long term (current) use of insulin: Secondary | ICD-10-CM | POA: Diagnosis not present

## 2020-09-16 DIAGNOSIS — E109 Type 1 diabetes mellitus without complications: Secondary | ICD-10-CM | POA: Diagnosis not present

## 2020-09-18 DIAGNOSIS — Z8673 Personal history of transient ischemic attack (TIA), and cerebral infarction without residual deficits: Secondary | ICD-10-CM | POA: Diagnosis not present

## 2020-09-18 DIAGNOSIS — G8250 Quadriplegia, unspecified: Secondary | ICD-10-CM | POA: Diagnosis not present

## 2020-09-18 DIAGNOSIS — S46012D Strain of muscle(s) and tendon(s) of the rotator cuff of left shoulder, subsequent encounter: Secondary | ICD-10-CM | POA: Diagnosis not present

## 2020-09-18 DIAGNOSIS — E109 Type 1 diabetes mellitus without complications: Secondary | ICD-10-CM | POA: Diagnosis not present

## 2020-09-18 DIAGNOSIS — N200 Calculus of kidney: Secondary | ICD-10-CM | POA: Diagnosis not present

## 2020-09-20 ENCOUNTER — Encounter
Admission: RE | Disposition: A | Discharge status: Home / Self Care | Payer: Self-pay | Source: Ambulatory Visit | Attending: Vascular Surgery

## 2020-09-20 ENCOUNTER — Encounter: Payer: Self-pay | Admitting: Vascular Surgery

## 2020-09-20 ENCOUNTER — Other Ambulatory Visit: Payer: Self-pay

## 2020-09-20 ENCOUNTER — Other Ambulatory Visit (INDEPENDENT_AMBULATORY_CARE_PROVIDER_SITE_OTHER): Payer: Self-pay | Admitting: Nurse Practitioner

## 2020-09-20 ENCOUNTER — Other Ambulatory Visit
Admission: RE | Admit: 2020-09-20 | Discharge: 2020-09-20 | Disposition: A | Discharge status: Home / Self Care | Payer: Medicare Other | Source: Ambulatory Visit | Attending: Vascular Surgery | Admitting: Vascular Surgery

## 2020-09-20 ENCOUNTER — Ambulatory Visit
Admission: RE | Admit: 2020-09-20 | Discharge: 2020-09-20 | Disposition: A | Discharge status: Home / Self Care | Payer: Medicare Other | Source: Ambulatory Visit | Attending: Vascular Surgery | Admitting: Vascular Surgery

## 2020-09-20 ENCOUNTER — Telehealth (INDEPENDENT_AMBULATORY_CARE_PROVIDER_SITE_OTHER): Payer: Self-pay

## 2020-09-20 DIAGNOSIS — Z794 Long term (current) use of insulin: Secondary | ICD-10-CM | POA: Diagnosis not present

## 2020-09-20 DIAGNOSIS — E109 Type 1 diabetes mellitus without complications: Secondary | ICD-10-CM | POA: Diagnosis not present

## 2020-09-20 DIAGNOSIS — G8250 Quadriplegia, unspecified: Secondary | ICD-10-CM | POA: Diagnosis not present

## 2020-09-20 DIAGNOSIS — Z792 Long term (current) use of antibiotics: Secondary | ICD-10-CM | POA: Diagnosis not present

## 2020-09-20 DIAGNOSIS — Z888 Allergy status to other drugs, medicaments and biological substances status: Secondary | ICD-10-CM | POA: Insufficient documentation

## 2020-09-20 DIAGNOSIS — I509 Heart failure, unspecified: Secondary | ICD-10-CM | POA: Diagnosis not present

## 2020-09-20 DIAGNOSIS — Z91041 Radiographic dye allergy status: Secondary | ICD-10-CM | POA: Insufficient documentation

## 2020-09-20 DIAGNOSIS — K219 Gastro-esophageal reflux disease without esophagitis: Secondary | ICD-10-CM | POA: Insufficient documentation

## 2020-09-20 DIAGNOSIS — N390 Urinary tract infection, site not specified: Secondary | ICD-10-CM

## 2020-09-20 DIAGNOSIS — Z20822 Contact with and (suspected) exposure to covid-19: Secondary | ICD-10-CM | POA: Diagnosis not present

## 2020-09-20 DIAGNOSIS — Z882 Allergy status to sulfonamides status: Secondary | ICD-10-CM | POA: Insufficient documentation

## 2020-09-20 DIAGNOSIS — F329 Major depressive disorder, single episode, unspecified: Secondary | ICD-10-CM | POA: Insufficient documentation

## 2020-09-20 HISTORY — PX: PICC LINE INSERTION: CATH118290

## 2020-09-20 LAB — SARS CORONAVIRUS 2 BY RT PCR (HOSPITAL ORDER, PERFORMED IN ~~LOC~~ HOSPITAL LAB): SARS Coronavirus 2: NEGATIVE

## 2020-09-20 SURGERY — PICC LINE INSERTION
Anesthesia: LOCAL

## 2020-09-20 MED ORDER — MIDAZOLAM HCL 2 MG/2ML IJ SOLN: INTRAMUSCULAR | Status: DC | PRN

## 2020-09-20 MED ORDER — LIDOCAINE HCL (PF) 1 % IJ SOLN
INTRAMUSCULAR | Status: DC | PRN
  Administered 2020-09-20: 5 mL via INTRADERMAL

## 2020-09-20 MED ORDER — SODIUM CHLORIDE 0.9 % IV SOLN
1.0000 g | Freq: Once | INTRAVENOUS | Status: AC
  Administered 2020-09-20: 1 g via INTRAVENOUS
  Filled 2020-09-20: qty 1

## 2020-09-20 MED ORDER — HEPARIN SOD (PORK) LOCK FLUSH 100 UNIT/ML IV SOLN
INTRAVENOUS | Status: AC
  Filled 2020-09-20: qty 5

## 2020-09-20 SURGICAL SUPPLY — 3 items
CATH PICC 5FR POWER SINGLE (CATHETERS) ×1 IMPLANT
DRAPE BRACHIAL (DRAPES) ×3 IMPLANT
PACK ANGIOGRAPHY (CUSTOM PROCEDURE TRAY) ×2 IMPLANT
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
## 2020-09-20 NOTE — Op Note (Signed)
OPERATIVE NOTE   PROCEDURE: 1. 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  ANESTHESIA: Local 1% lidocaine  ESTIMATED BLOOD LOSS: Minimal  INDICATIONS:   [name] is a 49 y.o. male who presents with urinary tract infection requiring parenteral antibiotics.  He is therefore undergoing insertion of a PICC line.  Risks and benefits of been reviewed all questions answered patient has agreed to proceed.  DESCRIPTION: After obtaining full informed written consent, the patient was brought back to  to special procedures.  The right arm was sterilely prepped and draped, a sterile surgical field was created. The right basilic vein was accessed under direct ultrasound guidance without difficulty utilizing a micropuncture needle and a 0.018 wire was then advanced into the superior vena cava. A permanent image was recorded. Peel-away sheath was then placed over the wire. A single lumen peripherally inserted central venous catheter was then advanced over the wire and the wire and peel-away sheath were removed. The catheter tip was then verified to be in the superior vena cava and the catheter was secured to the skin at 37 cm with a sterile dressing including a Biopatch.  The catheter aspirates easily and flushes without difficulty. It is packed with heparinized saline.  Patient tolerated procedure well there were no complications.  COMPLICATIONS: There were no complications  CONDITION: Unchanged  Katha Cabal, M.D. [facility] [phone]   09/20/2020, 3:21 PM

## 2020-09-20 NOTE — Telephone Encounter (Signed)
Spoke with the patient and he is scheduled with Dr. Delana Meyer for a PICC line insertion today at [facility]. Covid testing today at the [place]. Pre-procedure instructions were discussed.

## 2020-09-20 NOTE — Interval H&P Note (Signed)
History and Physical Interval Note:  09/20/2020 2:17 PM  [name]  has presented today for surgery, with the diagnosis of PICC Line Insertion   Prophylactic antibiotics   IV DYE ALLERGY  Pt is PARAPLEGIC   Pt to have Covid test 09-20-20.  The various methods of treatment have been discussed with the patient and family. After consideration of risks, benefits and other options for treatment, the patient has consented to  Procedure(s): PICC LINE INSERTION (N/A) as a surgical intervention.  The patient's history has been reviewed, patient examined, no change in status, stable for surgery.  I have reviewed the patient's chart and labs.  Questions were answered to the patient's satisfaction.     Hortencia Pilar

## 2020-09-21 DIAGNOSIS — Z8673 Personal history of transient ischemic attack (TIA), and cerebral infarction without residual deficits: Secondary | ICD-10-CM | POA: Diagnosis not present

## 2020-09-21 DIAGNOSIS — E109 Type 1 diabetes mellitus without complications: Secondary | ICD-10-CM | POA: Diagnosis not present

## 2020-09-21 DIAGNOSIS — G8250 Quadriplegia, unspecified: Secondary | ICD-10-CM | POA: Diagnosis not present

## 2020-09-21 DIAGNOSIS — S46012D Strain of muscle(s) and tendon(s) of the rotator cuff of left shoulder, subsequent encounter: Secondary | ICD-10-CM | POA: Diagnosis not present

## 2020-09-21 DIAGNOSIS — N200 Calculus of kidney: Secondary | ICD-10-CM | POA: Diagnosis not present

## 2020-09-22 DIAGNOSIS — N200 Calculus of kidney: Secondary | ICD-10-CM | POA: Diagnosis not present

## 2020-09-22 DIAGNOSIS — G8250 Quadriplegia, unspecified: Secondary | ICD-10-CM | POA: Diagnosis not present

## 2020-09-22 DIAGNOSIS — S46012D Strain of muscle(s) and tendon(s) of the rotator cuff of left shoulder, subsequent encounter: Secondary | ICD-10-CM | POA: Diagnosis not present

## 2020-09-22 DIAGNOSIS — E109 Type 1 diabetes mellitus without complications: Secondary | ICD-10-CM | POA: Diagnosis not present

## 2020-09-22 DIAGNOSIS — Z8673 Personal history of transient ischemic attack (TIA), and cerebral infarction without residual deficits: Secondary | ICD-10-CM | POA: Diagnosis not present

## 2020-09-23 ENCOUNTER — Encounter: Payer: Self-pay | Admitting: Vascular Surgery

## 2020-09-24 ENCOUNTER — Ambulatory Visit: Payer: Medicare Other | Admitting: Gastroenterology

## 2020-09-24 ENCOUNTER — Encounter: Payer: Self-pay | Admitting: Gastroenterology

## 2020-09-24 ENCOUNTER — Encounter: Payer: Self-pay | Admitting: Vascular Surgery

## 2020-09-24 DIAGNOSIS — G8250 Quadriplegia, unspecified: Secondary | ICD-10-CM | POA: Diagnosis not present

## 2020-09-24 DIAGNOSIS — S46012D Strain of muscle(s) and tendon(s) of the rotator cuff of left shoulder, subsequent encounter: Secondary | ICD-10-CM | POA: Diagnosis not present

## 2020-09-24 DIAGNOSIS — Z8673 Personal history of transient ischemic attack (TIA), and cerebral infarction without residual deficits: Secondary | ICD-10-CM | POA: Diagnosis not present

## 2020-09-24 DIAGNOSIS — N200 Calculus of kidney: Secondary | ICD-10-CM | POA: Diagnosis not present

## 2020-09-24 DIAGNOSIS — E109 Type 1 diabetes mellitus without complications: Secondary | ICD-10-CM | POA: Diagnosis not present

## 2020-09-25 DIAGNOSIS — G8250 Quadriplegia, unspecified: Secondary | ICD-10-CM | POA: Diagnosis not present

## 2020-09-25 DIAGNOSIS — E109 Type 1 diabetes mellitus without complications: Secondary | ICD-10-CM | POA: Diagnosis not present

## 2020-09-25 DIAGNOSIS — N200 Calculus of kidney: Secondary | ICD-10-CM | POA: Diagnosis not present

## 2020-09-25 DIAGNOSIS — Z8673 Personal history of transient ischemic attack (TIA), and cerebral infarction without residual deficits: Secondary | ICD-10-CM | POA: Diagnosis not present

## 2020-09-25 DIAGNOSIS — S46012D Strain of muscle(s) and tendon(s) of the rotator cuff of left shoulder, subsequent encounter: Secondary | ICD-10-CM | POA: Diagnosis not present

## 2020-09-27 DIAGNOSIS — S46012D Strain of muscle(s) and tendon(s) of the rotator cuff of left shoulder, subsequent encounter: Secondary | ICD-10-CM | POA: Diagnosis not present

## 2020-09-27 DIAGNOSIS — N200 Calculus of kidney: Secondary | ICD-10-CM | POA: Diagnosis not present

## 2020-09-27 DIAGNOSIS — G8250 Quadriplegia, unspecified: Secondary | ICD-10-CM | POA: Diagnosis not present

## 2020-09-27 DIAGNOSIS — Z8673 Personal history of transient ischemic attack (TIA), and cerebral infarction without residual deficits: Secondary | ICD-10-CM | POA: Diagnosis not present

## 2020-09-27 DIAGNOSIS — E109 Type 1 diabetes mellitus without complications: Secondary | ICD-10-CM | POA: Diagnosis not present

## 2020-10-02 DIAGNOSIS — E109 Type 1 diabetes mellitus without complications: Secondary | ICD-10-CM | POA: Diagnosis not present

## 2020-10-02 DIAGNOSIS — Z8673 Personal history of transient ischemic attack (TIA), and cerebral infarction without residual deficits: Secondary | ICD-10-CM | POA: Diagnosis not present

## 2020-10-02 DIAGNOSIS — S46012D Strain of muscle(s) and tendon(s) of the rotator cuff of left shoulder, subsequent encounter: Secondary | ICD-10-CM | POA: Diagnosis not present

## 2020-10-02 DIAGNOSIS — G8250 Quadriplegia, unspecified: Secondary | ICD-10-CM | POA: Diagnosis not present

## 2020-10-02 DIAGNOSIS — N200 Calculus of kidney: Secondary | ICD-10-CM | POA: Diagnosis not present

## 2020-10-02 NOTE — Telephone Encounter (Signed)
[name] called she still hasn't received the fax for the power assist

## 2020-10-02 NOTE — Telephone Encounter (Signed)
[employer] verified fax received.

## 2020-10-02 NOTE — Telephone Encounter (Signed)
I have re faxed form and notes to [employer] Sloop at [phone].

## 2020-10-02 NOTE — Telephone Encounter (Signed)
Error added to other message

## 2020-10-02 NOTE — Telephone Encounter (Signed)
[name] called back stating that she hasn't  received the fax [phone] for pt Power assist  for pt wheel chair

## 2020-10-03 ENCOUNTER — Telehealth: Payer: Self-pay | Admitting: Internal Medicine

## 2020-10-03 NOTE — Telephone Encounter (Signed)
Patient called in need help with the power add on

## 2020-10-03 NOTE — Telephone Encounter (Signed)
Spoke with pt. According to pt, [employer] has not received any info. Patient requested that I emal the info to him and he will take care of it. Emailed to the email that was provided by patient per patient request.

## 2020-10-03 NOTE — Telephone Encounter (Signed)
See my chart message

## 2020-10-04 ENCOUNTER — Other Ambulatory Visit: Payer: Self-pay | Admitting: Internal Medicine

## 2020-10-09 DIAGNOSIS — Z8673 Personal history of transient ischemic attack (TIA), and cerebral infarction without residual deficits: Secondary | ICD-10-CM | POA: Diagnosis not present

## 2020-10-09 DIAGNOSIS — G8250 Quadriplegia, unspecified: Secondary | ICD-10-CM | POA: Diagnosis not present

## 2020-10-09 DIAGNOSIS — N200 Calculus of kidney: Secondary | ICD-10-CM | POA: Diagnosis not present

## 2020-10-09 DIAGNOSIS — S46012D Strain of muscle(s) and tendon(s) of the rotator cuff of left shoulder, subsequent encounter: Secondary | ICD-10-CM | POA: Diagnosis not present

## 2020-10-09 DIAGNOSIS — E109 Type 1 diabetes mellitus without complications: Secondary | ICD-10-CM | POA: Diagnosis not present

## 2020-10-10 ENCOUNTER — Telehealth: Payer: Self-pay | Admitting: Internal Medicine

## 2020-10-10 ENCOUNTER — Encounter: Payer: Self-pay | Admitting: Internal Medicine

## 2020-10-10 DIAGNOSIS — M7989 Other specified soft tissue disorders: Secondary | ICD-10-CM

## 2020-10-10 NOTE — Telephone Encounter (Signed)
Has been having issues with swelling.  Confirm no acute symptoms - sob, etc.  Also confirm ok to be referred to [facility].   If so, then I will place order for referra.

## 2020-10-10 NOTE — Telephone Encounter (Signed)
Cythnia from [employer] called [phone] in stated that paperwork that was faxed back to them one of the form was not filled out and she is refaxing the it today to be filled out

## 2020-10-11 DIAGNOSIS — G8220 Paraplegia, unspecified: Secondary | ICD-10-CM | POA: Diagnosis not present

## 2020-10-11 DIAGNOSIS — N3020 Other chronic cystitis without hematuria: Secondary | ICD-10-CM | POA: Diagnosis not present

## 2020-10-11 NOTE — Telephone Encounter (Signed)
Awaiting fax.

## 2020-10-12 NOTE — Telephone Encounter (Signed)
Order placed for referral to [facility]

## 2020-10-14 NOTE — Telephone Encounter (Signed)
Faxed form to [employer]

## 2020-10-15 ENCOUNTER — Ambulatory Visit (INDEPENDENT_AMBULATORY_CARE_PROVIDER_SITE_OTHER): Payer: Medicare Other | Admitting: Nurse Practitioner

## 2020-10-16 ENCOUNTER — Ambulatory Visit (INDEPENDENT_AMBULATORY_CARE_PROVIDER_SITE_OTHER): Payer: Medicare Other | Admitting: Nurse Practitioner

## 2020-10-16 DIAGNOSIS — N390 Urinary tract infection, site not specified: Secondary | ICD-10-CM | POA: Diagnosis not present

## 2020-10-16 DIAGNOSIS — G8250 Quadriplegia, unspecified: Secondary | ICD-10-CM | POA: Diagnosis not present

## 2020-10-16 DIAGNOSIS — G8220 Paraplegia, unspecified: Secondary | ICD-10-CM | POA: Diagnosis not present

## 2020-10-16 DIAGNOSIS — Z87440 Personal history of urinary (tract) infections: Secondary | ICD-10-CM | POA: Diagnosis not present

## 2020-10-16 DIAGNOSIS — R339 Retention of urine, unspecified: Secondary | ICD-10-CM | POA: Diagnosis not present

## 2020-10-16 DIAGNOSIS — E109 Type 1 diabetes mellitus without complications: Secondary | ICD-10-CM | POA: Diagnosis not present

## 2020-10-17 ENCOUNTER — Encounter: Payer: Self-pay | Admitting: Internal Medicine

## 2020-10-17 DIAGNOSIS — S46012D Strain of muscle(s) and tendon(s) of the rotator cuff of left shoulder, subsequent encounter: Secondary | ICD-10-CM | POA: Diagnosis not present

## 2020-10-17 DIAGNOSIS — N200 Calculus of kidney: Secondary | ICD-10-CM | POA: Diagnosis not present

## 2020-10-17 DIAGNOSIS — D649 Anemia, unspecified: Secondary | ICD-10-CM

## 2020-10-17 DIAGNOSIS — G8250 Quadriplegia, unspecified: Secondary | ICD-10-CM | POA: Diagnosis not present

## 2020-10-17 DIAGNOSIS — E109 Type 1 diabetes mellitus without complications: Secondary | ICD-10-CM | POA: Diagnosis not present

## 2020-10-17 DIAGNOSIS — Z8673 Personal history of transient ischemic attack (TIA), and cerebral infarction without residual deficits: Secondary | ICD-10-CM | POA: Diagnosis not present

## 2020-10-18 ENCOUNTER — Encounter (INDEPENDENT_AMBULATORY_CARE_PROVIDER_SITE_OTHER): Payer: Self-pay | Admitting: Nurse Practitioner

## 2020-10-18 ENCOUNTER — Other Ambulatory Visit: Payer: Self-pay

## 2020-10-18 ENCOUNTER — Ambulatory Visit (INDEPENDENT_AMBULATORY_CARE_PROVIDER_SITE_OTHER): Payer: Medicare Other | Admitting: Nurse Practitioner

## 2020-10-18 ENCOUNTER — Other Ambulatory Visit (INDEPENDENT_AMBULATORY_CARE_PROVIDER_SITE_OTHER): Payer: Medicare Other

## 2020-10-18 VITALS — BP 158/98 | HR 51 | Resp 16

## 2020-10-18 DIAGNOSIS — I890 Lymphedema, not elsewhere classified: Secondary | ICD-10-CM

## 2020-10-18 DIAGNOSIS — G8220 Paraplegia, unspecified: Secondary | ICD-10-CM

## 2020-10-18 DIAGNOSIS — D649 Anemia, unspecified: Secondary | ICD-10-CM

## 2020-10-18 LAB — IBC + FERRITIN
Ferritin: 43.3 ng/mL (ref 22.0–322.0)
Iron: 80 ug/dL (ref 42–165)
Saturation Ratios: 23.4 % (ref 20.0–50.0)
Transferrin: 244 mg/dL (ref 212.0–360.0)

## 2020-10-18 LAB — CBC WITH DIFFERENTIAL/PLATELET
Basophils Absolute: 0 10*3/uL (ref 0.0–0.1)
Basophils Relative: 0.5 % (ref 0.0–3.0)
Eosinophils Absolute: 0 10*3/uL (ref 0.0–0.7)
Eosinophils Relative: 0.8 % (ref 0.0–5.0)
HCT: 37.4 % — ABNORMAL LOW (ref 39.0–52.0)
Hemoglobin: 12.8 g/dL — ABNORMAL LOW (ref 13.0–17.0)
Lymphocytes Relative: 16 % (ref 12.0–46.0)
Lymphs Abs: 0.9 10*3/uL (ref 0.7–4.0)
MCHC: 34.2 g/dL (ref 30.0–36.0)
MCV: 93.1 fl (ref 78.0–100.0)
Monocytes Absolute: 0.4 10*3/uL (ref 0.1–1.0)
Monocytes Relative: 7.1 % (ref 3.0–12.0)
Neutro Abs: 4.2 10*3/uL (ref 1.4–7.7)
Neutrophils Relative %: 75.6 % (ref 43.0–77.0)
Platelets: 211 10*3/uL (ref 150.0–400.0)
RBC: 4.02 Mil/uL — ABNORMAL LOW (ref 4.22–5.81)
RDW: 14.6 % (ref 11.5–15.5)
WBC: 5.6 10*3/uL (ref 4.0–10.5)

## 2020-10-18 LAB — VITAMIN B12: Vitamin B-12: >1526 pg/mL — ABNORMAL HIGH (ref 211–911)

## 2020-10-18 NOTE — Telephone Encounter (Signed)
He just had a1c and cholesterol checked.  Does not need these repeated.  On his last labs, he was found to be anemic and had low iron.  I had placed order for GI referral.  Per lab phone message, he had agreed to referral, but on the referral note states "refused service".  I would like to repeat his cbc and iron studies and would like to get him in with GI.  Is he agreeable for referral and will need a non fasting lab appt.

## 2020-10-18 NOTE — Telephone Encounter (Signed)
Pt scheduled  
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
## 2020-10-21 ENCOUNTER — Telehealth (INDEPENDENT_AMBULATORY_CARE_PROVIDER_SITE_OTHER): Payer: Medicare Other | Admitting: Internal Medicine

## 2020-10-21 DIAGNOSIS — R609 Edema, unspecified: Secondary | ICD-10-CM

## 2020-10-21 DIAGNOSIS — G8250 Quadriplegia, unspecified: Secondary | ICD-10-CM | POA: Diagnosis not present

## 2020-10-21 DIAGNOSIS — G8220 Paraplegia, unspecified: Secondary | ICD-10-CM

## 2020-10-21 DIAGNOSIS — R030 Elevated blood-pressure reading, without diagnosis of hypertension: Secondary | ICD-10-CM | POA: Diagnosis not present

## 2020-10-21 DIAGNOSIS — D62 Acute posthemorrhagic anemia: Secondary | ICD-10-CM

## 2020-10-21 DIAGNOSIS — Z87440 Personal history of urinary (tract) infections: Secondary | ICD-10-CM | POA: Diagnosis not present

## 2020-10-21 DIAGNOSIS — E1069 Type 1 diabetes mellitus with other specified complication: Secondary | ICD-10-CM

## 2020-10-21 DIAGNOSIS — R339 Retention of urine, unspecified: Secondary | ICD-10-CM | POA: Diagnosis not present

## 2020-10-21 DIAGNOSIS — E109 Type 1 diabetes mellitus without complications: Secondary | ICD-10-CM | POA: Diagnosis not present

## 2020-10-21 MED ORDER — BACLOFEN 20 MG PO TABS
ORAL_TABLET | ORAL | 12 refills | Status: DC
Start: 2020-10-21 — End: 2021-10-25
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
## 2020-10-23 DIAGNOSIS — S46012D Strain of muscle(s) and tendon(s) of the rotator cuff of left shoulder, subsequent encounter: Secondary | ICD-10-CM | POA: Diagnosis not present

## 2020-10-23 DIAGNOSIS — N200 Calculus of kidney: Secondary | ICD-10-CM | POA: Diagnosis not present

## 2020-10-23 DIAGNOSIS — Z8673 Personal history of transient ischemic attack (TIA), and cerebral infarction without residual deficits: Secondary | ICD-10-CM | POA: Diagnosis not present

## 2020-10-23 DIAGNOSIS — E109 Type 1 diabetes mellitus without complications: Secondary | ICD-10-CM | POA: Diagnosis not present

## 2020-10-23 DIAGNOSIS — G8250 Quadriplegia, unspecified: Secondary | ICD-10-CM | POA: Diagnosis not present

## 2020-10-27 ENCOUNTER — Encounter: Payer: Self-pay | Admitting: Internal Medicine

## 2020-10-27 NOTE — Assessment & Plan Note (Signed)
Follow cbc.  

## 2020-10-27 NOTE — Assessment & Plan Note (Signed)
Blood pressure fluctuating.  Recheck today improved.  Hold on additional medication.  Follow pressures.

## 2020-10-27 NOTE — Assessment & Plan Note (Signed)
Persistent increased lower extremity swelling.  Saw [facility].  Compression hose.  Planning f/u.  If persistent, hopefully will qualify for lymphedema pump.  He has increased (on his own) his lasix to 64m q day.  Will decrease dose.  Needs metabolic panel checked.  He will notify me when he can come in for lab appt.  Need to schedule with another upcoming appt to avoid multiple trips.

## 2020-10-27 NOTE — Assessment & Plan Note (Signed)
Recent shoulder surgery as outlined.  Feels reinjured his shoulder trying to maneuver his wheelchair.  F/u with ortho.

## 2020-10-27 NOTE — Assessment & Plan Note (Signed)
Low carb diet and exercise.  He is adjusting his insulin.  Discussed eating regular meals.  Follow sugars. Follow met b and a1c.

## 2020-10-27 NOTE — Assessment & Plan Note (Signed)
Recently received IV abx.  Follow.  Continue f/u with urology.

## 2020-10-30 ENCOUNTER — Other Ambulatory Visit: Payer: Self-pay | Admitting: Internal Medicine

## 2020-10-30 ENCOUNTER — Encounter: Payer: Self-pay | Admitting: Internal Medicine

## 2020-10-30 DIAGNOSIS — N200 Calculus of kidney: Secondary | ICD-10-CM | POA: Diagnosis not present

## 2020-10-30 DIAGNOSIS — S46012D Strain of muscle(s) and tendon(s) of the rotator cuff of left shoulder, subsequent encounter: Secondary | ICD-10-CM | POA: Diagnosis not present

## 2020-10-30 DIAGNOSIS — Z8673 Personal history of transient ischemic attack (TIA), and cerebral infarction without residual deficits: Secondary | ICD-10-CM | POA: Diagnosis not present

## 2020-10-30 DIAGNOSIS — E109 Type 1 diabetes mellitus without complications: Secondary | ICD-10-CM | POA: Diagnosis not present

## 2020-10-30 DIAGNOSIS — G8250 Quadriplegia, unspecified: Secondary | ICD-10-CM | POA: Diagnosis not present

## 2020-11-05 ENCOUNTER — Other Ambulatory Visit: Payer: Self-pay

## 2020-11-05 MED ORDER — INSULIN DETEMIR 100 UNIT/ML ~~LOC~~ SOLN: SUBCUTANEOUS | 2 refills | Status: DC

## 2020-11-05 NOTE — Progress Notes (Signed)
Patient requested refills on levemir rx as it was sent before with 0 refills. Rx has been resent

## 2020-11-07 DIAGNOSIS — G8220 Paraplegia, unspecified: Secondary | ICD-10-CM | POA: Diagnosis not present

## 2020-11-07 DIAGNOSIS — N200 Calculus of kidney: Secondary | ICD-10-CM | POA: Diagnosis not present

## 2020-11-07 DIAGNOSIS — Z8673 Personal history of transient ischemic attack (TIA), and cerebral infarction without residual deficits: Secondary | ICD-10-CM | POA: Diagnosis not present

## 2020-11-07 DIAGNOSIS — E109 Type 1 diabetes mellitus without complications: Secondary | ICD-10-CM | POA: Diagnosis not present

## 2020-11-07 DIAGNOSIS — G8250 Quadriplegia, unspecified: Secondary | ICD-10-CM | POA: Diagnosis not present

## 2020-11-07 DIAGNOSIS — S46012D Strain of muscle(s) and tendon(s) of the rotator cuff of left shoulder, subsequent encounter: Secondary | ICD-10-CM | POA: Diagnosis not present

## 2020-11-11 DIAGNOSIS — M7582 Other shoulder lesions, left shoulder: Secondary | ICD-10-CM | POA: Diagnosis not present

## 2020-11-11 DIAGNOSIS — S46012D Strain of muscle(s) and tendon(s) of the rotator cuff of left shoulder, subsequent encounter: Secondary | ICD-10-CM | POA: Diagnosis not present

## 2020-11-13 DIAGNOSIS — N200 Calculus of kidney: Secondary | ICD-10-CM | POA: Diagnosis not present

## 2020-11-13 DIAGNOSIS — S46012D Strain of muscle(s) and tendon(s) of the rotator cuff of left shoulder, subsequent encounter: Secondary | ICD-10-CM | POA: Diagnosis not present

## 2020-11-13 DIAGNOSIS — E109 Type 1 diabetes mellitus without complications: Secondary | ICD-10-CM | POA: Diagnosis not present

## 2020-11-13 DIAGNOSIS — G8250 Quadriplegia, unspecified: Secondary | ICD-10-CM | POA: Diagnosis not present

## 2020-11-13 DIAGNOSIS — Z8673 Personal history of transient ischemic attack (TIA), and cerebral infarction without residual deficits: Secondary | ICD-10-CM | POA: Diagnosis not present

## 2020-11-13 DIAGNOSIS — Z794 Long term (current) use of insulin: Secondary | ICD-10-CM | POA: Diagnosis not present

## 2020-11-13 DIAGNOSIS — Z792 Long term (current) use of antibiotics: Secondary | ICD-10-CM | POA: Diagnosis not present

## 2020-11-18 ENCOUNTER — Other Ambulatory Visit: Payer: Self-pay

## 2020-11-18 ENCOUNTER — Ambulatory Visit (INDEPENDENT_AMBULATORY_CARE_PROVIDER_SITE_OTHER): Payer: Medicare Other | Admitting: Nurse Practitioner

## 2020-11-18 ENCOUNTER — Encounter (INDEPENDENT_AMBULATORY_CARE_PROVIDER_SITE_OTHER): Payer: Self-pay | Admitting: Nurse Practitioner

## 2020-11-18 VITALS — BP 97/59 | HR 55 | Resp 16

## 2020-11-18 DIAGNOSIS — S46012D Strain of muscle(s) and tendon(s) of the rotator cuff of left shoulder, subsequent encounter: Secondary | ICD-10-CM | POA: Diagnosis not present

## 2020-11-18 DIAGNOSIS — G8220 Paraplegia, unspecified: Secondary | ICD-10-CM | POA: Diagnosis not present

## 2020-11-18 DIAGNOSIS — I890 Lymphedema, not elsewhere classified: Secondary | ICD-10-CM

## 2020-11-19 ENCOUNTER — Other Ambulatory Visit: Payer: Self-pay | Admitting: Cardiovascular Disease

## 2020-11-19 NOTE — Telephone Encounter (Signed)
Rx request sent to pharmacy.  

## 2020-11-20 DIAGNOSIS — Z794 Long term (current) use of insulin: Secondary | ICD-10-CM | POA: Diagnosis not present

## 2020-11-20 DIAGNOSIS — Z792 Long term (current) use of antibiotics: Secondary | ICD-10-CM | POA: Diagnosis not present

## 2020-11-20 DIAGNOSIS — S46012D Strain of muscle(s) and tendon(s) of the rotator cuff of left shoulder, subsequent encounter: Secondary | ICD-10-CM | POA: Diagnosis not present

## 2020-11-20 DIAGNOSIS — E109 Type 1 diabetes mellitus without complications: Secondary | ICD-10-CM | POA: Diagnosis not present

## 2020-11-20 DIAGNOSIS — G8250 Quadriplegia, unspecified: Secondary | ICD-10-CM | POA: Diagnosis not present

## 2020-11-20 DIAGNOSIS — N200 Calculus of kidney: Secondary | ICD-10-CM | POA: Diagnosis not present

## 2020-11-20 DIAGNOSIS — Z8673 Personal history of transient ischemic attack (TIA), and cerebral infarction without residual deficits: Secondary | ICD-10-CM | POA: Diagnosis not present

## 2020-11-25 ENCOUNTER — Encounter (INDEPENDENT_AMBULATORY_CARE_PROVIDER_SITE_OTHER): Payer: Self-pay | Admitting: Nurse Practitioner
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
## 2020-11-26 DIAGNOSIS — G8250 Quadriplegia, unspecified: Secondary | ICD-10-CM | POA: Diagnosis not present

## 2020-11-26 DIAGNOSIS — Z87440 Personal history of urinary (tract) infections: Secondary | ICD-10-CM | POA: Diagnosis not present

## 2020-11-26 DIAGNOSIS — E109 Type 1 diabetes mellitus without complications: Secondary | ICD-10-CM | POA: Diagnosis not present

## 2020-11-26 DIAGNOSIS — R339 Retention of urine, unspecified: Secondary | ICD-10-CM | POA: Diagnosis not present

## 2020-11-27 DIAGNOSIS — E109 Type 1 diabetes mellitus without complications: Secondary | ICD-10-CM | POA: Diagnosis not present

## 2020-11-27 DIAGNOSIS — Z8673 Personal history of transient ischemic attack (TIA), and cerebral infarction without residual deficits: Secondary | ICD-10-CM | POA: Diagnosis not present

## 2020-11-27 DIAGNOSIS — Z794 Long term (current) use of insulin: Secondary | ICD-10-CM | POA: Diagnosis not present

## 2020-11-27 DIAGNOSIS — Z792 Long term (current) use of antibiotics: Secondary | ICD-10-CM | POA: Diagnosis not present

## 2020-11-27 DIAGNOSIS — G8250 Quadriplegia, unspecified: Secondary | ICD-10-CM | POA: Diagnosis not present

## 2020-11-27 DIAGNOSIS — S46012D Strain of muscle(s) and tendon(s) of the rotator cuff of left shoulder, subsequent encounter: Secondary | ICD-10-CM | POA: Diagnosis not present

## 2020-11-27 DIAGNOSIS — N200 Calculus of kidney: Secondary | ICD-10-CM | POA: Diagnosis not present

## 2020-11-28 DIAGNOSIS — G8250 Quadriplegia, unspecified: Secondary | ICD-10-CM | POA: Diagnosis not present

## 2020-11-28 DIAGNOSIS — E109 Type 1 diabetes mellitus without complications: Secondary | ICD-10-CM | POA: Diagnosis not present

## 2020-11-28 DIAGNOSIS — R339 Retention of urine, unspecified: Secondary | ICD-10-CM | POA: Diagnosis not present

## 2020-12-03 ENCOUNTER — Other Ambulatory Visit: Payer: Self-pay | Admitting: Internal Medicine

## 2020-12-04 DIAGNOSIS — G8250 Quadriplegia, unspecified: Secondary | ICD-10-CM | POA: Diagnosis not present

## 2020-12-04 DIAGNOSIS — S46012D Strain of muscle(s) and tendon(s) of the rotator cuff of left shoulder, subsequent encounter: Secondary | ICD-10-CM | POA: Diagnosis not present

## 2020-12-04 DIAGNOSIS — Z8673 Personal history of transient ischemic attack (TIA), and cerebral infarction without residual deficits: Secondary | ICD-10-CM | POA: Diagnosis not present

## 2020-12-04 DIAGNOSIS — Z792 Long term (current) use of antibiotics: Secondary | ICD-10-CM | POA: Diagnosis not present

## 2020-12-04 DIAGNOSIS — N200 Calculus of kidney: Secondary | ICD-10-CM | POA: Diagnosis not present

## 2020-12-04 DIAGNOSIS — Z794 Long term (current) use of insulin: Secondary | ICD-10-CM | POA: Diagnosis not present

## 2020-12-04 DIAGNOSIS — E109 Type 1 diabetes mellitus without complications: Secondary | ICD-10-CM | POA: Diagnosis not present

## 2020-12-08 DIAGNOSIS — G8220 Paraplegia, unspecified: Secondary | ICD-10-CM | POA: Diagnosis not present

## 2020-12-11 DIAGNOSIS — Z792 Long term (current) use of antibiotics: Secondary | ICD-10-CM | POA: Diagnosis not present

## 2020-12-11 DIAGNOSIS — G8250 Quadriplegia, unspecified: Secondary | ICD-10-CM | POA: Diagnosis not present

## 2020-12-11 DIAGNOSIS — N200 Calculus of kidney: Secondary | ICD-10-CM | POA: Diagnosis not present

## 2020-12-11 DIAGNOSIS — S46012D Strain of muscle(s) and tendon(s) of the rotator cuff of left shoulder, subsequent encounter: Secondary | ICD-10-CM | POA: Diagnosis not present

## 2020-12-11 DIAGNOSIS — Z794 Long term (current) use of insulin: Secondary | ICD-10-CM | POA: Diagnosis not present

## 2020-12-11 DIAGNOSIS — E109 Type 1 diabetes mellitus without complications: Secondary | ICD-10-CM | POA: Diagnosis not present

## 2020-12-11 DIAGNOSIS — Z8673 Personal history of transient ischemic attack (TIA), and cerebral infarction without residual deficits: Secondary | ICD-10-CM | POA: Diagnosis not present

## 2020-12-18 ENCOUNTER — Other Ambulatory Visit: Payer: Self-pay

## 2020-12-18 ENCOUNTER — Encounter: Payer: Self-pay | Admitting: Internal Medicine

## 2020-12-18 DIAGNOSIS — G8250 Quadriplegia, unspecified: Secondary | ICD-10-CM | POA: Diagnosis not present

## 2020-12-18 DIAGNOSIS — N200 Calculus of kidney: Secondary | ICD-10-CM | POA: Diagnosis not present

## 2020-12-18 DIAGNOSIS — E109 Type 1 diabetes mellitus without complications: Secondary | ICD-10-CM | POA: Diagnosis not present

## 2020-12-18 DIAGNOSIS — Z794 Long term (current) use of insulin: Secondary | ICD-10-CM | POA: Diagnosis not present

## 2020-12-18 DIAGNOSIS — Z792 Long term (current) use of antibiotics: Secondary | ICD-10-CM | POA: Diagnosis not present

## 2020-12-18 DIAGNOSIS — Z8673 Personal history of transient ischemic attack (TIA), and cerebral infarction without residual deficits: Secondary | ICD-10-CM | POA: Diagnosis not present

## 2020-12-18 DIAGNOSIS — S46012D Strain of muscle(s) and tendon(s) of the rotator cuff of left shoulder, subsequent encounter: Secondary | ICD-10-CM | POA: Diagnosis not present

## 2020-12-18 MED ORDER — DEXCOM G6 SENSOR MISC: 1.0000 | 1 refills | Status: DC

## 2020-12-18 NOTE — Telephone Encounter (Signed)
I have  emailed his prescription

## 2020-12-18 NOTE — Telephone Encounter (Signed)
Medication sent in to preferred pharmacy per protocol.

## 2020-12-18 NOTE — Telephone Encounter (Signed)
Patient called in about his diabetes medication he stated that the prescription has not been updated and he has not received his medication he is about to run out he wants a call back

## 2020-12-18 NOTE — Telephone Encounter (Signed)
Rx faxed and patient is aware.

## 2020-12-19 DIAGNOSIS — E109 Type 1 diabetes mellitus without complications: Secondary | ICD-10-CM | POA: Diagnosis not present

## 2020-12-19 DIAGNOSIS — Z794 Long term (current) use of insulin: Secondary | ICD-10-CM | POA: Diagnosis not present

## 2020-12-20 ENCOUNTER — Telehealth (INDEPENDENT_AMBULATORY_CARE_PROVIDER_SITE_OTHER): Payer: Self-pay

## 2020-12-20 NOTE — Telephone Encounter (Signed)
This was received:  HI [name] all is well.  Patient MRN [id number] does not have durable medical equipment coverage on his insurance. We called and made him aware and he said he didn't want to add that to his insurance. I offered to give him a donated pump and he didn't care to get the donated pump.  Just wanted to make you aware. Thanks and have a great weekend!   [place]

## 2021-01-01 DIAGNOSIS — N200 Calculus of kidney: Secondary | ICD-10-CM | POA: Diagnosis not present

## 2021-01-01 DIAGNOSIS — Z8673 Personal history of transient ischemic attack (TIA), and cerebral infarction without residual deficits: Secondary | ICD-10-CM | POA: Diagnosis not present

## 2021-01-01 DIAGNOSIS — S46012D Strain of muscle(s) and tendon(s) of the rotator cuff of left shoulder, subsequent encounter: Secondary | ICD-10-CM | POA: Diagnosis not present

## 2021-01-01 DIAGNOSIS — Z794 Long term (current) use of insulin: Secondary | ICD-10-CM | POA: Diagnosis not present

## 2021-01-01 DIAGNOSIS — Z792 Long term (current) use of antibiotics: Secondary | ICD-10-CM | POA: Diagnosis not present

## 2021-01-01 DIAGNOSIS — G8250 Quadriplegia, unspecified: Secondary | ICD-10-CM | POA: Diagnosis not present

## 2021-01-01 DIAGNOSIS — E109 Type 1 diabetes mellitus without complications: Secondary | ICD-10-CM | POA: Diagnosis not present

## 2021-01-07 DIAGNOSIS — Z792 Long term (current) use of antibiotics: Secondary | ICD-10-CM | POA: Diagnosis not present

## 2021-01-07 DIAGNOSIS — S46012D Strain of muscle(s) and tendon(s) of the rotator cuff of left shoulder, subsequent encounter: Secondary | ICD-10-CM | POA: Diagnosis not present

## 2021-01-07 DIAGNOSIS — Z794 Long term (current) use of insulin: Secondary | ICD-10-CM | POA: Diagnosis not present

## 2021-01-07 DIAGNOSIS — N200 Calculus of kidney: Secondary | ICD-10-CM | POA: Diagnosis not present

## 2021-01-07 DIAGNOSIS — E109 Type 1 diabetes mellitus without complications: Secondary | ICD-10-CM | POA: Diagnosis not present

## 2021-01-07 DIAGNOSIS — G8250 Quadriplegia, unspecified: Secondary | ICD-10-CM | POA: Diagnosis not present

## 2021-01-07 DIAGNOSIS — Z8673 Personal history of transient ischemic attack (TIA), and cerebral infarction without residual deficits: Secondary | ICD-10-CM | POA: Diagnosis not present

## 2021-01-08 DIAGNOSIS — G8220 Paraplegia, unspecified: Secondary | ICD-10-CM | POA: Diagnosis not present

## 2021-01-17 DIAGNOSIS — S46012D Strain of muscle(s) and tendon(s) of the rotator cuff of left shoulder, subsequent encounter: Secondary | ICD-10-CM | POA: Diagnosis not present

## 2021-01-17 DIAGNOSIS — M7582 Other shoulder lesions, left shoulder: Secondary | ICD-10-CM | POA: Diagnosis not present

## 2021-01-29 ENCOUNTER — Encounter: Payer: Self-pay | Admitting: Internal Medicine

## 2021-01-29 ENCOUNTER — Telehealth (INDEPENDENT_AMBULATORY_CARE_PROVIDER_SITE_OTHER): Payer: Medicare Other | Admitting: Internal Medicine

## 2021-01-29 ENCOUNTER — Telehealth: Payer: Self-pay | Admitting: Internal Medicine

## 2021-01-29 VITALS — Ht 72.01 in | Wt 170.0 lb

## 2021-01-29 DIAGNOSIS — I959 Hypotension, unspecified: Secondary | ICD-10-CM

## 2021-01-29 DIAGNOSIS — E1069 Type 1 diabetes mellitus with other specified complication: Secondary | ICD-10-CM | POA: Diagnosis not present

## 2021-01-29 DIAGNOSIS — G8220 Paraplegia, unspecified: Secondary | ICD-10-CM | POA: Diagnosis not present

## 2021-01-29 DIAGNOSIS — D62 Acute posthemorrhagic anemia: Secondary | ICD-10-CM

## 2021-01-29 DIAGNOSIS — Z87440 Personal history of urinary (tract) infections: Secondary | ICD-10-CM

## 2021-01-29 DIAGNOSIS — R609 Edema, unspecified: Secondary | ICD-10-CM | POA: Diagnosis not present

## 2021-01-29 DIAGNOSIS — I998 Other disorder of circulatory system: Secondary | ICD-10-CM | POA: Diagnosis not present

## 2021-01-29 NOTE — Telephone Encounter (Signed)
Orders placed for labs.  Thanks.    Dr Nicki Reaper

## 2021-01-29 NOTE — Telephone Encounter (Signed)
You can send back to me or Santiago Glad to schedule labs

## 2021-01-29 NOTE — Telephone Encounter (Signed)
Per Dr. Nicki Reaper, patient needs labs in one month. Please put orders in. Will schedule patient.
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
## 2021-02-02 ENCOUNTER — Encounter: Payer: Self-pay | Admitting: Internal Medicine

## 2021-02-02 DIAGNOSIS — I998 Other disorder of circulatory system: Secondary | ICD-10-CM | POA: Insufficient documentation

## 2021-02-02 NOTE — Assessment & Plan Note (Signed)
Able to do ADLs now.    Recovered from shoulder surgery.

## 2021-02-02 NOTE — Assessment & Plan Note (Signed)
Taking lasix daily.  Has seen [facility].  Swelling improved.  Follow.

## 2021-02-02 NOTE — Assessment & Plan Note (Signed)
Has dexcom now.  Sensor alarms when sugar is low.  Not a significant problem for him now.  Follow met b and a1c.  Discussed diet.

## 2021-02-02 NOTE — Assessment & Plan Note (Signed)
Follow cbc.

## 2021-02-02 NOTE — Assessment & Plan Note (Signed)
Blood pressure fluctuating as outlined.  Taking lasix daily.  Takes hydralazine prn.  No specific triggers.  Plans to discuss with Dr Rockey Situ.

## 2021-02-02 NOTE — Assessment & Plan Note (Signed)
Followed by urology.  No urinary symptoms now.

## 2021-02-05 DIAGNOSIS — G8220 Paraplegia, unspecified: Secondary | ICD-10-CM | POA: Diagnosis not present

## 2021-02-11 ENCOUNTER — Ambulatory Visit (INDEPENDENT_AMBULATORY_CARE_PROVIDER_SITE_OTHER): Payer: Medicare Other | Admitting: Vascular Surgery

## 2021-02-13 DIAGNOSIS — N3020 Other chronic cystitis without hematuria: Secondary | ICD-10-CM | POA: Diagnosis not present

## 2021-02-13 DIAGNOSIS — N390 Urinary tract infection, site not specified: Secondary | ICD-10-CM | POA: Diagnosis not present

## 2021-02-13 DIAGNOSIS — G8220 Paraplegia, unspecified: Secondary | ICD-10-CM | POA: Diagnosis not present

## 2021-02-13 DIAGNOSIS — R3129 Other microscopic hematuria: Secondary | ICD-10-CM | POA: Diagnosis not present

## 2021-02-18 DIAGNOSIS — N3000 Acute cystitis without hematuria: Secondary | ICD-10-CM | POA: Diagnosis not present

## 2021-02-18 DIAGNOSIS — N390 Urinary tract infection, site not specified: Secondary | ICD-10-CM | POA: Diagnosis not present

## 2021-02-24 ENCOUNTER — Encounter: Payer: Self-pay | Admitting: Internal Medicine

## 2021-02-25 NOTE — Telephone Encounter (Signed)
Letter typed

## 2021-02-25 NOTE — Telephone Encounter (Signed)
I did not see anything about a letter in his last office visit note. I was not sure if you knew what letter he is talking about.

## 2021-02-27 ENCOUNTER — Other Ambulatory Visit: Payer: Self-pay

## 2021-02-27 ENCOUNTER — Other Ambulatory Visit (INDEPENDENT_AMBULATORY_CARE_PROVIDER_SITE_OTHER): Payer: Medicare Other

## 2021-02-27 DIAGNOSIS — D62 Acute posthemorrhagic anemia: Secondary | ICD-10-CM

## 2021-02-27 DIAGNOSIS — E1069 Type 1 diabetes mellitus with other specified complication: Secondary | ICD-10-CM

## 2021-02-27 LAB — LIPID PANEL
Cholesterol: 153 mg/dL (ref 0–200)
HDL: 64.6 mg/dL (ref >39.00)
LDL Cholesterol: 75 mg/dL (ref 0–99)
NonHDL: 88.78
Total CHOL/HDL Ratio: 2
Triglycerides: 69 mg/dL (ref 0.0–149.0)
VLDL: 13.8 mg/dL (ref 0.0–40.0)

## 2021-02-27 LAB — CBC WITH DIFFERENTIAL/PLATELET
Basophils Absolute: 0 10*3/uL (ref 0.0–0.1)
Basophils Relative: 1 % (ref 0.0–3.0)
Eosinophils Absolute: 0.1 10*3/uL (ref 0.0–0.7)
Eosinophils Relative: 1.6 % (ref 0.0–5.0)
HCT: 36.9 % — ABNORMAL LOW (ref 39.0–52.0)
Hemoglobin: 12.9 g/dL — ABNORMAL LOW (ref 13.0–17.0)
Lymphocytes Relative: 15.8 % (ref 12.0–46.0)
Lymphs Abs: 0.6 10*3/uL — ABNORMAL LOW (ref 0.7–4.0)
MCHC: 35.1 g/dL (ref 30.0–36.0)
MCV: 94.8 fl (ref 78.0–100.0)
Monocytes Absolute: 0.3 10*3/uL (ref 0.1–1.0)
Monocytes Relative: 7 % (ref 3.0–12.0)
Neutro Abs: 2.8 10*3/uL (ref 1.4–7.7)
Neutrophils Relative %: 74.6 % (ref 43.0–77.0)
Platelets: 223 10*3/uL (ref 150.0–400.0)
RBC: 3.89 Mil/uL — ABNORMAL LOW (ref 4.22–5.81)
RDW: 13.4 % (ref 11.5–15.5)
WBC: 3.8 10*3/uL — ABNORMAL LOW (ref 4.0–10.5)

## 2021-02-27 LAB — HEPATIC FUNCTION PANEL
ALT: 13 U/L (ref 0–53)
AST: 18 U/L (ref 0–37)
Albumin: 4.3 g/dL (ref 3.5–5.2)
Alkaline Phosphatase: 89 U/L (ref 39–117)
Bilirubin, Direct: 0.2 mg/dL (ref 0.0–0.3)
Total Bilirubin: 0.8 mg/dL (ref 0.2–1.2)
Total Protein: 6.5 g/dL (ref 6.0–8.3)

## 2021-02-27 LAB — BASIC METABOLIC PANEL
BUN: 9 mg/dL (ref 6–23)
CO2: 30 mEq/L (ref 19–32)
Calcium: 9.1 mg/dL (ref 8.4–10.5)
Chloride: 96 mEq/L (ref 96–112)
Creatinine, Ser: 0.69 mg/dL (ref 0.40–1.50)
GFR: 107.57 mL/min (ref >60.00)
Glucose, Bld: 97 mg/dL (ref 70–99)
Potassium: 4 mEq/L (ref 3.5–5.1)
Sodium: 134 mEq/L — ABNORMAL LOW (ref 135–145)

## 2021-02-27 LAB — HEMOGLOBIN A1C: Hgb A1c MFr Bld: 5.5 % (ref 4.6–6.5)

## 2021-02-27 LAB — MICROALBUMIN / CREATININE URINE RATIO
Creatinine,U: 56.9 mg/dL
Microalb Creat Ratio: 6.4 mg/g (ref 0.0–30.0)
Microalb, Ur: 3.6 mg/dL — ABNORMAL HIGH (ref 0.0–1.9)

## 2021-02-27 LAB — FERRITIN: Ferritin: 46.2 ng/mL (ref 22.0–322.0)

## 2021-02-27 NOTE — Addendum Note (Signed)
Addended by: Leeanne Rio on: 02/27/2021 10:37 AM   Modules accepted: Orders

## 2021-03-08 DIAGNOSIS — G8220 Paraplegia, unspecified: Secondary | ICD-10-CM | POA: Diagnosis not present

## 2021-03-19 ENCOUNTER — Other Ambulatory Visit: Payer: Self-pay | Admitting: Internal Medicine

## 2021-03-20 DIAGNOSIS — E109 Type 1 diabetes mellitus without complications: Secondary | ICD-10-CM | POA: Diagnosis not present

## 2021-03-20 DIAGNOSIS — Z794 Long term (current) use of insulin: Secondary | ICD-10-CM | POA: Diagnosis not present

## 2021-03-24 DIAGNOSIS — M65342 Trigger finger, left ring finger: Secondary | ICD-10-CM | POA: Diagnosis not present

## 2021-03-24 DIAGNOSIS — E1140 Type 2 diabetes mellitus with diabetic neuropathy, unspecified: Secondary | ICD-10-CM | POA: Diagnosis not present

## 2021-03-24 DIAGNOSIS — M65332 Trigger finger, left middle finger: Secondary | ICD-10-CM | POA: Diagnosis not present

## 2021-03-24 DIAGNOSIS — M1812 Unilateral primary osteoarthritis of first carpometacarpal joint, left hand: Secondary | ICD-10-CM | POA: Diagnosis not present

## 2021-03-24 DIAGNOSIS — M25542 Pain in joints of left hand: Secondary | ICD-10-CM | POA: Diagnosis not present

## 2021-03-24 DIAGNOSIS — Z794 Long term (current) use of insulin: Secondary | ICD-10-CM | POA: Diagnosis not present

## 2021-04-02 DIAGNOSIS — L97522 Non-pressure chronic ulcer of other part of left foot with fat layer exposed: Secondary | ICD-10-CM | POA: Diagnosis not present

## 2021-04-02 DIAGNOSIS — E1140 Type 2 diabetes mellitus with diabetic neuropathy, unspecified: Secondary | ICD-10-CM | POA: Diagnosis not present

## 2021-04-02 DIAGNOSIS — Z794 Long term (current) use of insulin: Secondary | ICD-10-CM | POA: Diagnosis not present

## 2021-04-02 DIAGNOSIS — L03116 Cellulitis of left lower limb: Secondary | ICD-10-CM | POA: Diagnosis not present

## 2021-04-07 DIAGNOSIS — G8220 Paraplegia, unspecified: Secondary | ICD-10-CM | POA: Diagnosis not present

## 2021-04-16 DIAGNOSIS — E1140 Type 2 diabetes mellitus with diabetic neuropathy, unspecified: Secondary | ICD-10-CM | POA: Diagnosis not present

## 2021-04-16 DIAGNOSIS — L97522 Non-pressure chronic ulcer of other part of left foot with fat layer exposed: Secondary | ICD-10-CM | POA: Diagnosis not present

## 2021-04-16 DIAGNOSIS — Z794 Long term (current) use of insulin: Secondary | ICD-10-CM | POA: Diagnosis not present

## 2021-04-28 ENCOUNTER — Encounter: Payer: Self-pay | Admitting: Pharmacist

## 2021-04-28 NOTE — Progress Notes (Signed)
[facility])                                            [place] Team                                        Statin Quality Measure Assessment    04/28/2021  [name] 1971/02/11 [id number]   Per review of chart and payor information, patient has a diagnosis of diabetes but is not currently filling a statin prescription.  This places patient into the SUPD (Statin Use In Patients with Diabetes) measure for CMS.    This patient has previously been taking rosuvastatin 20m once weekly, last filled on 12/03/20.   The ASCVD Risk score (Mikey BussingDC Jr., et al., 2013) failed to calculate for the following reasons:   The patient has a prior MI or stroke diagnosis 02/27/2021     Component Value Date/Time   CHOL 153 02/27/2021 1022   TRIG 69.0 02/27/2021 1022   HDL 64.60 02/27/2021 1022   CHOLHDL 2 02/27/2021 1022   VLDL 13.8 02/27/2021 1022   LDLCALC 75 02/27/2021 1022    Please consider reviewing the importance of statin therapy at next office visit.    Thank you!    JRuben Reason [phone]

## 2021-04-28 NOTE — Progress Notes (Signed)
Sent note to make note on schedule to discuss noncompliance.

## 2021-04-29 ENCOUNTER — Other Ambulatory Visit: Payer: Self-pay

## 2021-04-29 ENCOUNTER — Ambulatory Visit (INDEPENDENT_AMBULATORY_CARE_PROVIDER_SITE_OTHER): Payer: Medicare Other | Admitting: Internal Medicine

## 2021-04-29 DIAGNOSIS — G903 Multi-system degeneration of the autonomic nervous system: Secondary | ICD-10-CM | POA: Diagnosis not present

## 2021-04-29 DIAGNOSIS — I5032 Chronic diastolic (congestive) heart failure: Secondary | ICD-10-CM | POA: Diagnosis not present

## 2021-04-29 DIAGNOSIS — E1069 Type 1 diabetes mellitus with other specified complication: Secondary | ICD-10-CM | POA: Diagnosis not present

## 2021-04-29 DIAGNOSIS — G8220 Paraplegia, unspecified: Secondary | ICD-10-CM

## 2021-04-29 DIAGNOSIS — D62 Acute posthemorrhagic anemia: Secondary | ICD-10-CM | POA: Diagnosis not present

## 2021-04-29 DIAGNOSIS — F339 Major depressive disorder, recurrent, unspecified: Secondary | ICD-10-CM

## 2021-04-29 DIAGNOSIS — L98491 Non-pressure chronic ulcer of skin of other sites limited to breakdown of skin: Secondary | ICD-10-CM

## 2021-04-29 DIAGNOSIS — Z87440 Personal history of urinary (tract) infections: Secondary | ICD-10-CM | POA: Diagnosis not present

## 2021-04-29 DIAGNOSIS — E871 Hypo-osmolality and hyponatremia: Secondary | ICD-10-CM

## 2021-04-29 DIAGNOSIS — I959 Hypotension, unspecified: Secondary | ICD-10-CM | POA: Diagnosis not present

## 2021-04-29 DIAGNOSIS — R609 Edema, unspecified: Secondary | ICD-10-CM | POA: Diagnosis not present

## 2021-04-29 DIAGNOSIS — R159 Full incontinence of feces: Secondary | ICD-10-CM

## 2021-04-29 NOTE — Progress Notes (Signed)
noted 
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
## 2021-05-01 DIAGNOSIS — R339 Retention of urine, unspecified: Secondary | ICD-10-CM | POA: Diagnosis not present

## 2021-05-01 DIAGNOSIS — E109 Type 1 diabetes mellitus without complications: Secondary | ICD-10-CM | POA: Diagnosis not present

## 2021-05-01 DIAGNOSIS — Z87440 Personal history of urinary (tract) infections: Secondary | ICD-10-CM | POA: Diagnosis not present

## 2021-05-01 DIAGNOSIS — G8250 Quadriplegia, unspecified: Secondary | ICD-10-CM | POA: Diagnosis not present

## 2021-05-05 ENCOUNTER — Encounter: Payer: Self-pay | Admitting: Internal Medicine

## 2021-05-05 DIAGNOSIS — R159 Full incontinence of feces: Secondary | ICD-10-CM | POA: Insufficient documentation

## 2021-05-05 DIAGNOSIS — I5032 Chronic diastolic (congestive) heart failure: Secondary | ICD-10-CM | POA: Insufficient documentation

## 2021-05-05 DIAGNOSIS — G903 Multi-system degeneration of the autonomic nervous system: Secondary | ICD-10-CM | POA: Insufficient documentation

## 2021-05-05 NOTE — Assessment & Plan Note (Signed)
Followed by urology.   

## 2021-05-05 NOTE — Assessment & Plan Note (Signed)
Has noticed leakage and drainage from his rectum.  Request referral back to surgery.

## 2021-05-05 NOTE — Assessment & Plan Note (Signed)
Had episode in the office today where blood pressure decreased.  Sugar ok.  Ate a couple of packets of salt.  Previous concern regarding autonomic dysfunction.  Blood pressure responded well to eating salt.  Has seen cardiology.  Did take lasix today.  Hold lasix.  Follow pressures.  Keep f/u with cardiology.  Felt back to baseline prior to leaving.  Was going across parking lot to eat.  Declines further evaluation.

## 2021-05-05 NOTE — Assessment & Plan Note (Signed)
Per cardiology.  Has lasix to take as needed.  Took today.  Hold on retaking currently. Blood pressure stabilized.  Follow volume status.  Follow pressures.  Recommend f/u with cardiology.

## 2021-05-05 NOTE — Assessment & Plan Note (Signed)
Has had intermittent issues with hypotension.  Was on midodrin.  Followed by cardiology.

## 2021-05-05 NOTE — Assessment & Plan Note (Signed)
Has had issues with low sodium as outlined.  Follow metabolic panel.  Previous note, had stopped drinking.

## 2021-05-05 NOTE — Assessment & Plan Note (Signed)
Has seen [facility].  Swelling varies - he reports with hematuria.  Stable currently.  Follow.

## 2021-05-05 NOTE — Assessment & Plan Note (Signed)
Follow cbc.  

## 2021-05-05 NOTE — Assessment & Plan Note (Signed)
Has dexcom now.  Discussed diet.  Follow met b and a1c.  Needs to eat regular meals.

## 2021-05-05 NOTE — Assessment & Plan Note (Signed)
Discussed.  Denies an SI.  Desires no further intervention.  Follow.

## 2021-05-05 NOTE — Assessment & Plan Note (Signed)
Able to do ADLs now. Shoulder better. Having issues with his thumb.  Declines further evaluation at this time.

## 2021-05-05 NOTE — Assessment & Plan Note (Signed)
Being followed by podiatry for foot ulcer.

## 2021-05-08 DIAGNOSIS — G8220 Paraplegia, unspecified: Secondary | ICD-10-CM | POA: Diagnosis not present

## 2021-05-16 DIAGNOSIS — L97521 Non-pressure chronic ulcer of other part of left foot limited to breakdown of skin: Secondary | ICD-10-CM | POA: Diagnosis not present

## 2021-05-20 DIAGNOSIS — K649 Unspecified hemorrhoids: Secondary | ICD-10-CM | POA: Diagnosis not present

## 2021-05-22 ENCOUNTER — Other Ambulatory Visit: Payer: Self-pay | Admitting: Internal Medicine

## 2021-06-07 DIAGNOSIS — G8220 Paraplegia, unspecified: Secondary | ICD-10-CM | POA: Diagnosis not present

## 2021-06-10 IMAGING — CT CT HEAD W/O CM
3 series · 15 of 47 positions shown, 18 images · non-contrast
Comparison: 10/27/2010

CLINICAL DATA: Altered level of consciousness

EXAM:
CT HEAD WITHOUT CONTRAST
TECHNIQUE: Contiguous axial images were obtained from the base of the skull
through the vertex without intravenous contrast.

[Series 2: head wo · axial · 0.42mm/px · z∈[-33,+92]mm · 9 of 30 slices shown, 12 images]
[im 3/30  brain]
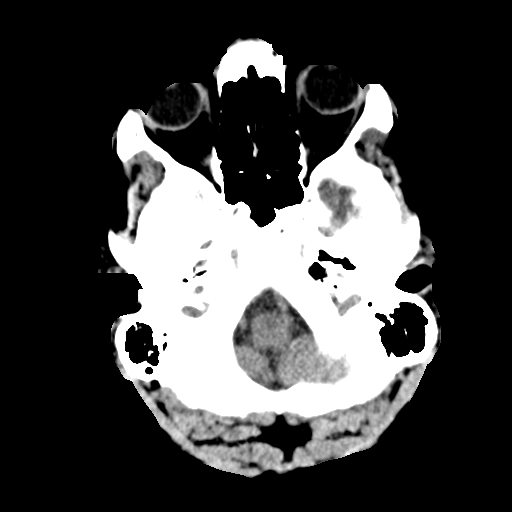
[im 3/30  bone]
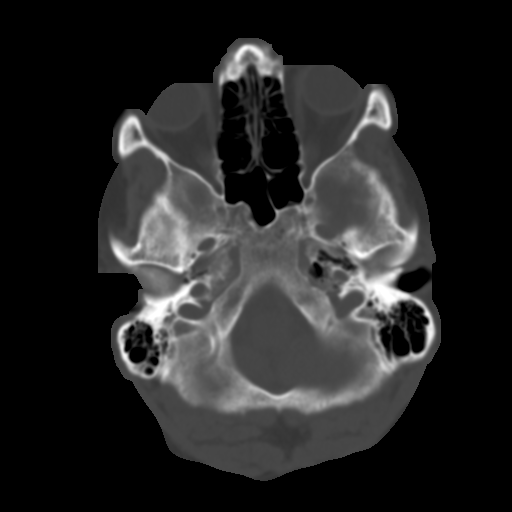
[im 6/30  brain]
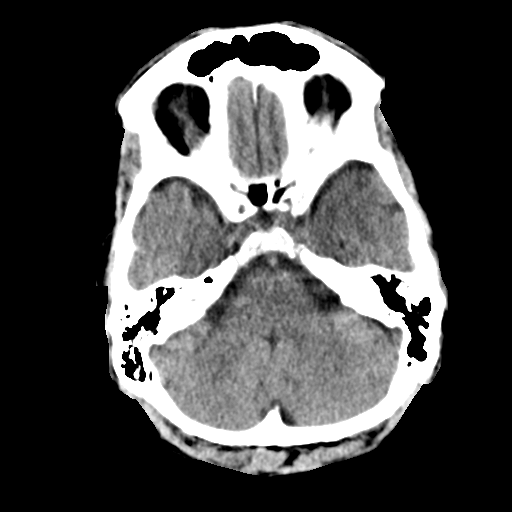
[im 9/30  brain]
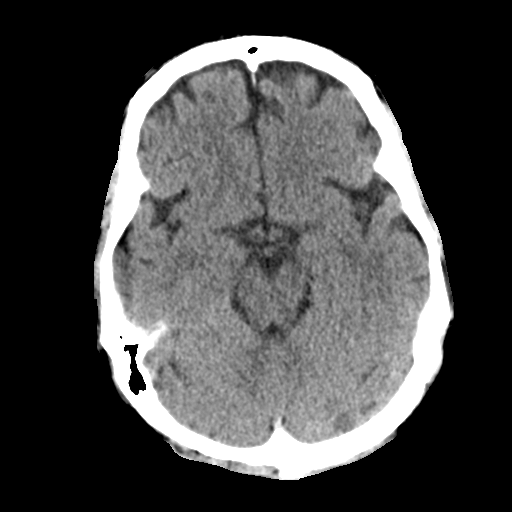
[im 12/30  brain]
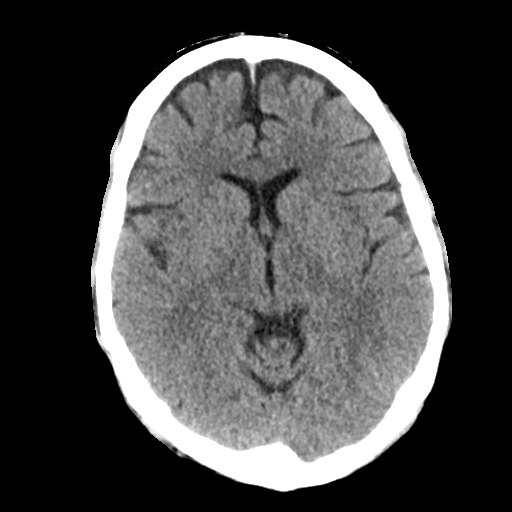
[im 16/30  brain]
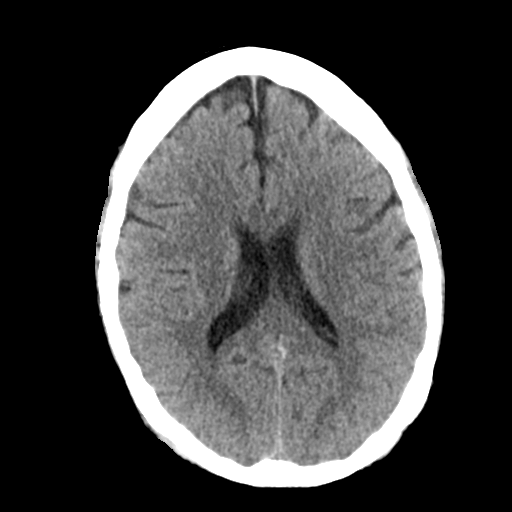
[im 16/30  bone]
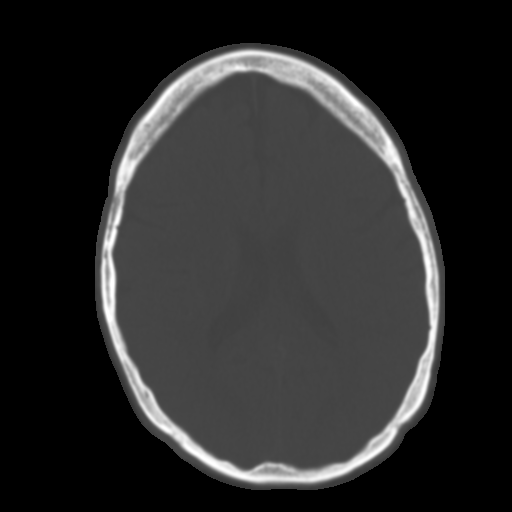
[im 19/30  brain]
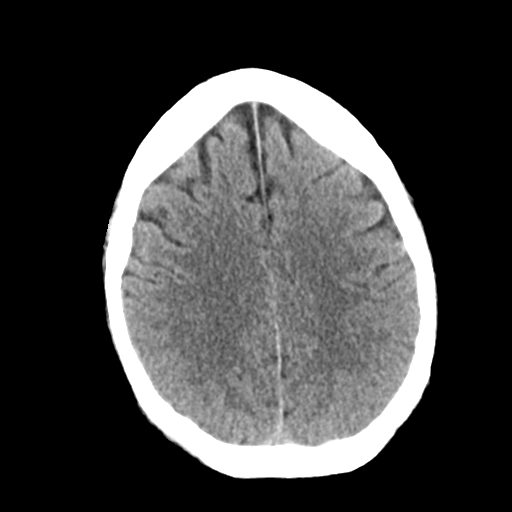
[im 22/30  brain]
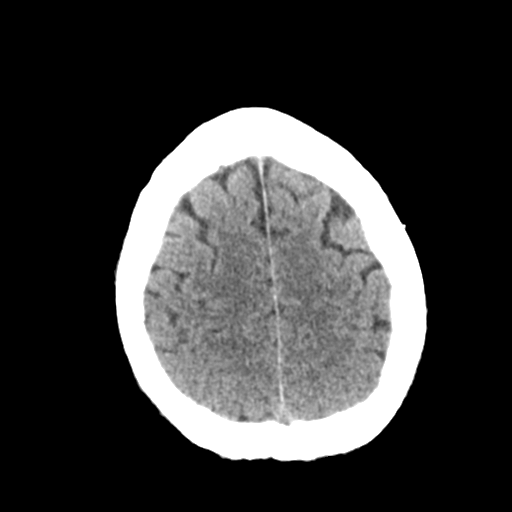
[im 25/30  brain]
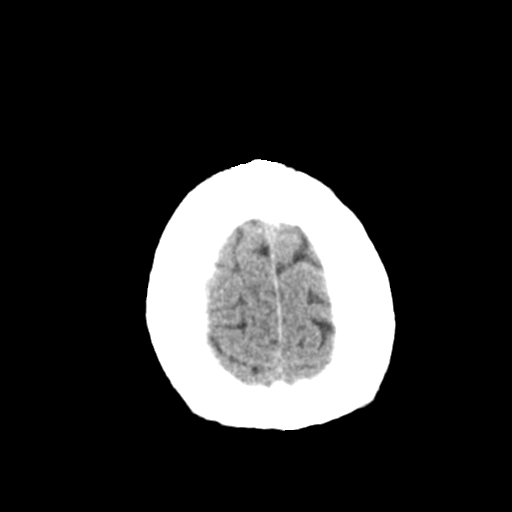
[im 28/30  brain]
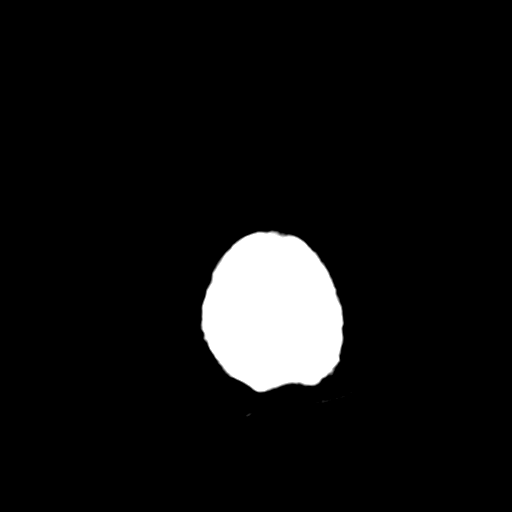
[im 28/30  bone]
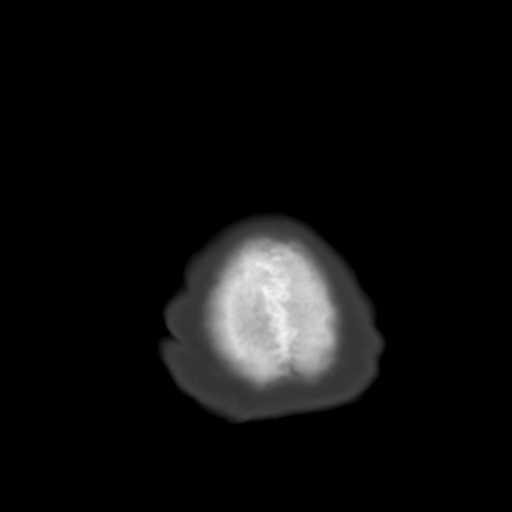

[Series 4: coronal soft tissue · coronal · 0.30mm/px · 3 of 68 slices shown]
[im 23/68  brain]
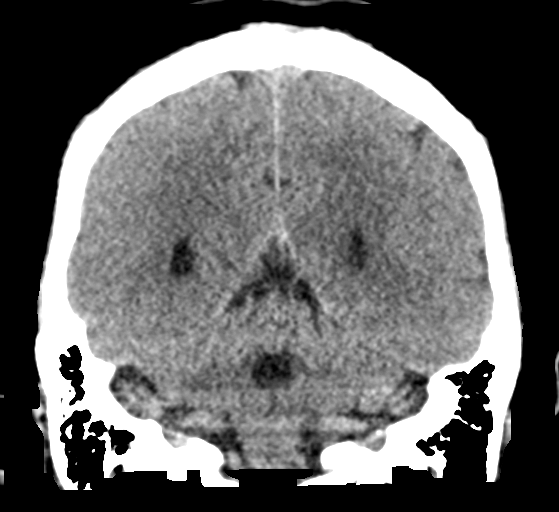
[im 30/68  brain]
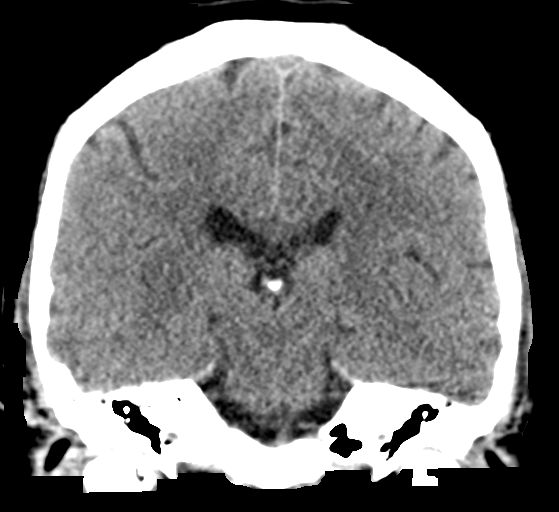
[im 38/68  brain]
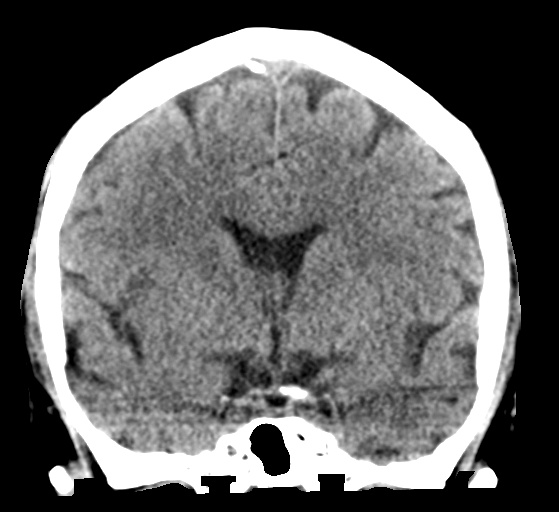

[Series 5: sagittal soft tissue · sagittal · 0.30mm/px · 3 of 60 slices shown]
[im 20/60  brain]
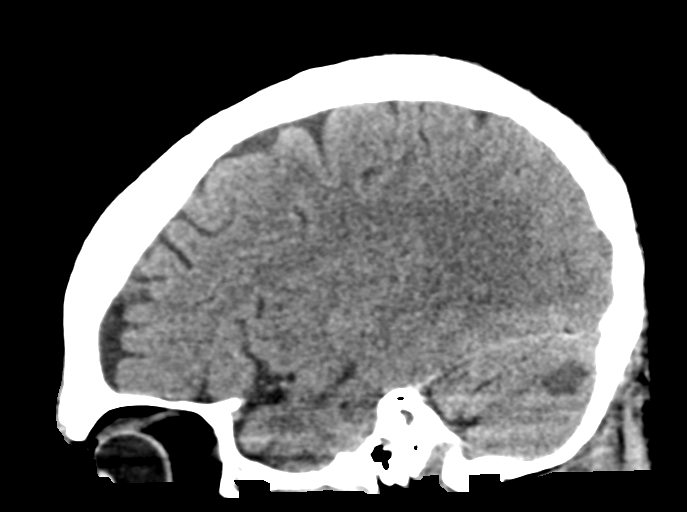
[im 30/60  brain]
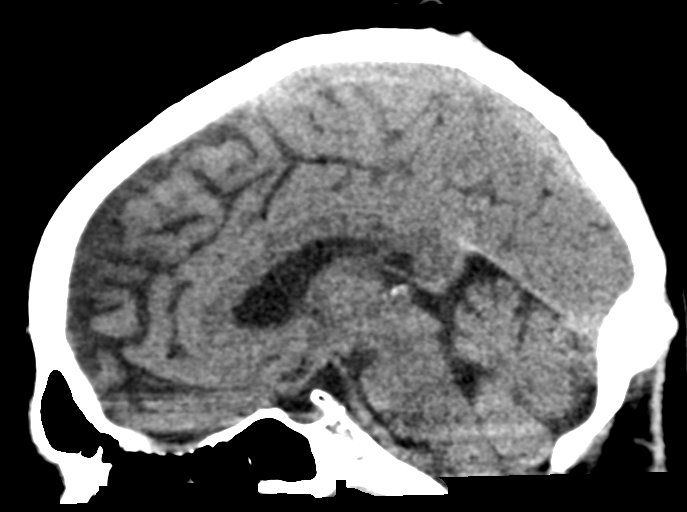
[im 40/60  brain]
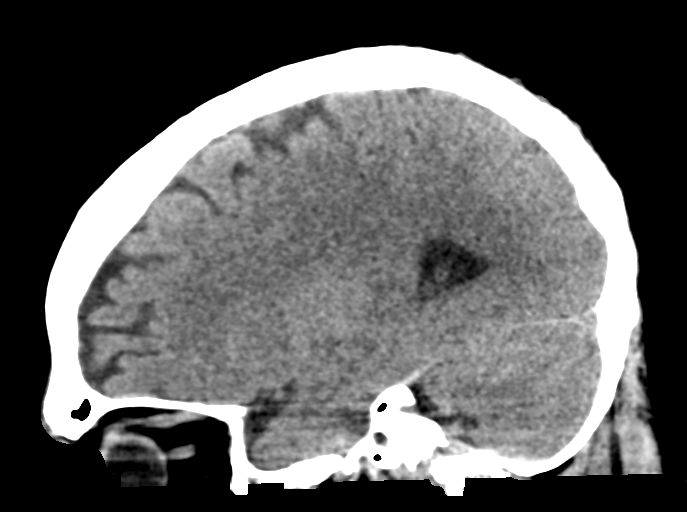

[15 of 47 positions shown; findings below may reference images not displayed]

FINDINGS: Brain: There is a hypodense lesion of the left cerebellar hemisphere
(series 2, image 8).

Vascular: No hyperdense vessel or unexpected calcification.

Skull: Normal. Negative for fracture or focal lesion.

Sinuses/Orbits: No acute finding.

Other: None.
IMPRESSION: There is a hypodense lesion of the left cerebellar hemisphere
(series 2, image 8), new compared to remote prior examination dated
10/27/2010 and suspicious for acute to subacute infarction. MRI may
be used to further assess for acute diffusion restricting
infarction.

## 2021-06-11 IMAGING — US US EXTREM LOW VENOUS
1 series · 13 of 24 positions shown · non-contrast
Comparison: None.

CLINICAL DATA: Bilateral lower extremity edema. History of embolic
stroke, currently on anticoagulation. Evaluate for DVT.



[Series 1: us extrem low venous · 13 of 60 slices shown]
[im 1/60]
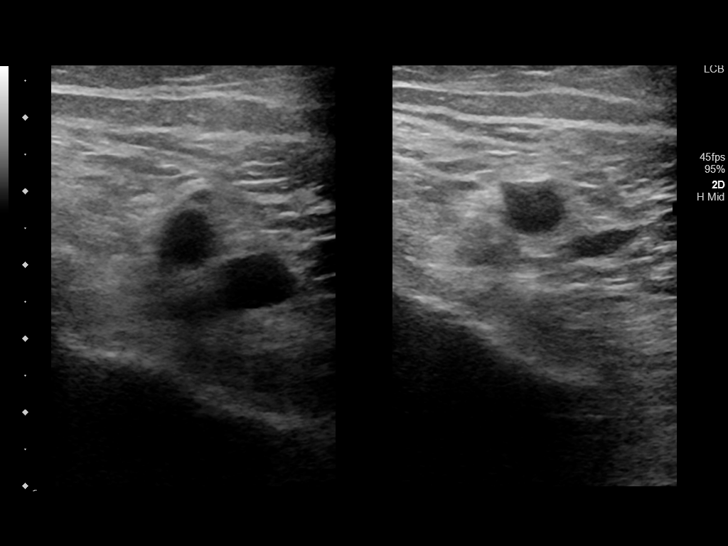
[im 6/60]
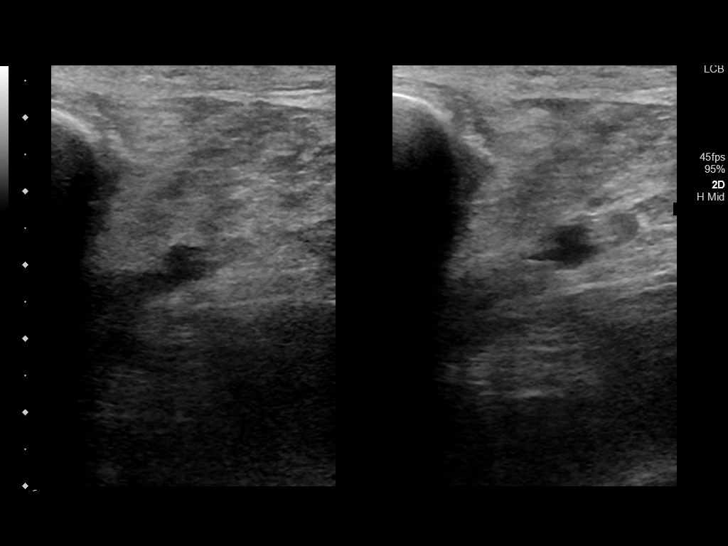
[im 11/60]
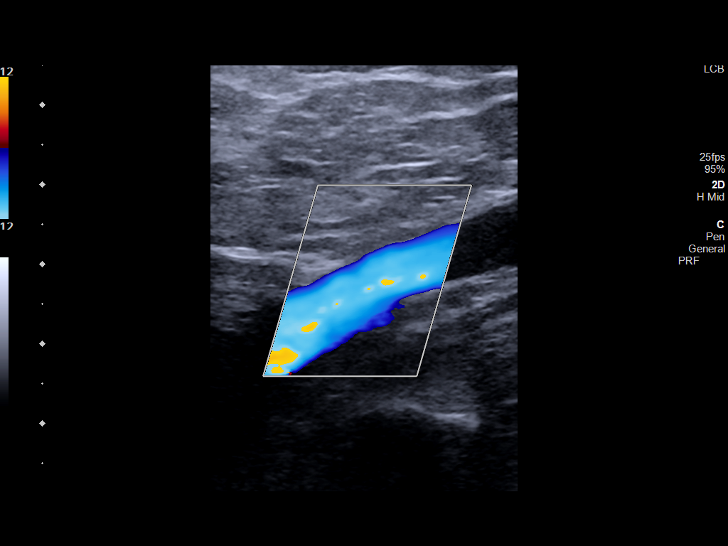
[im 16/60]
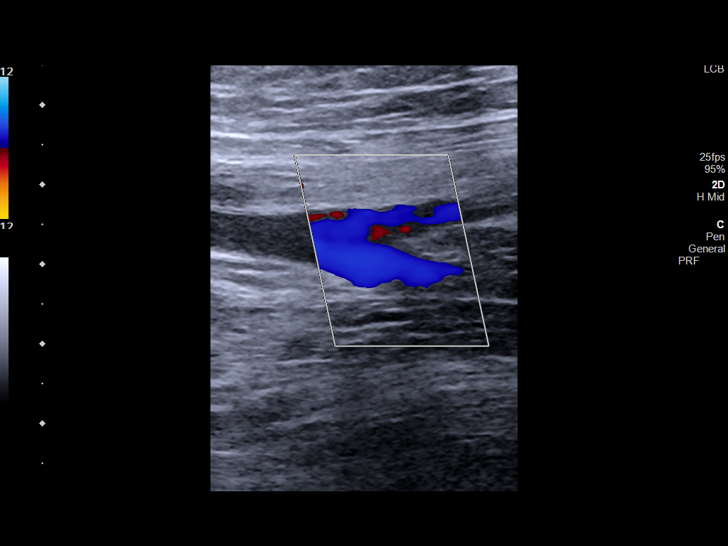
[im 21/60]
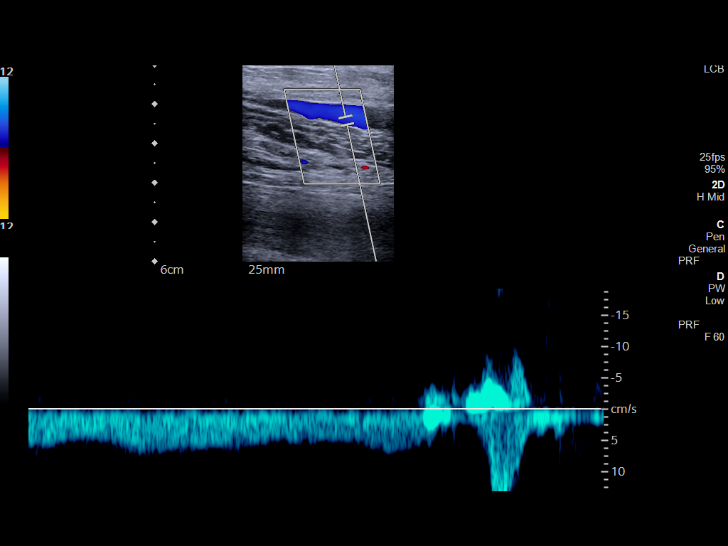
[im 26/60]
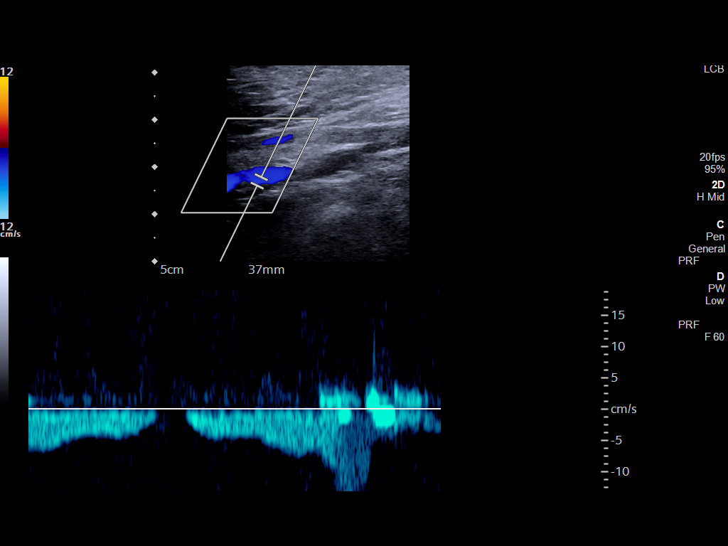
[im 31/60]
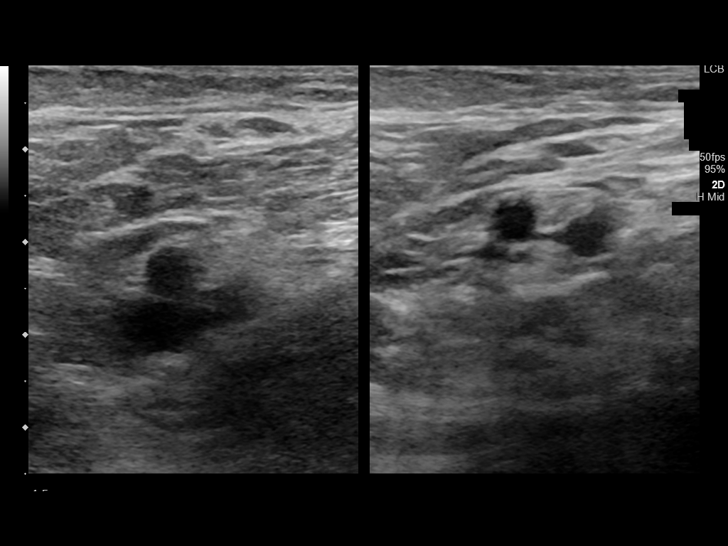
[im 34/60]
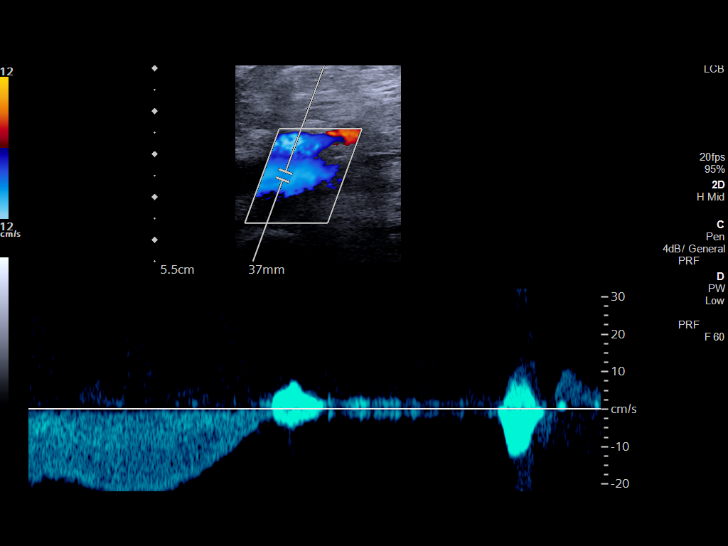
[im 39/60]
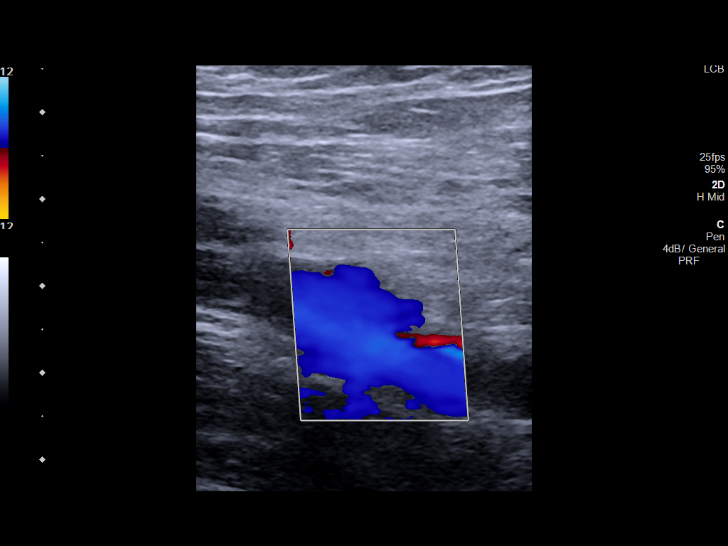
[im 44/60]
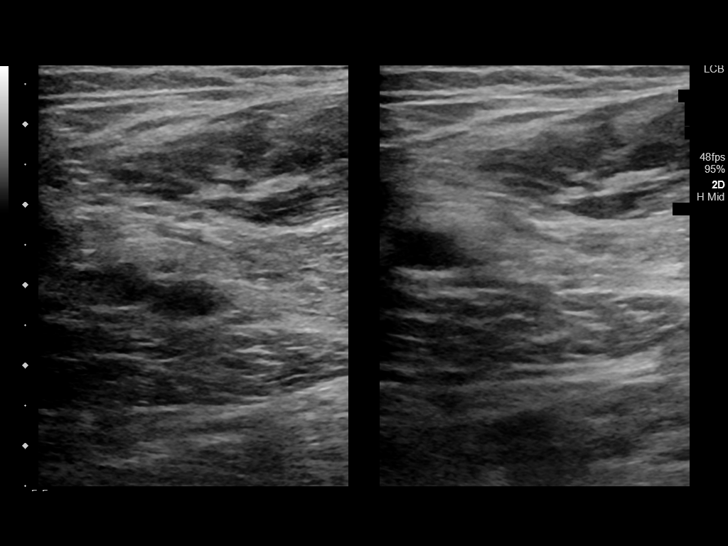
[im 49/60]
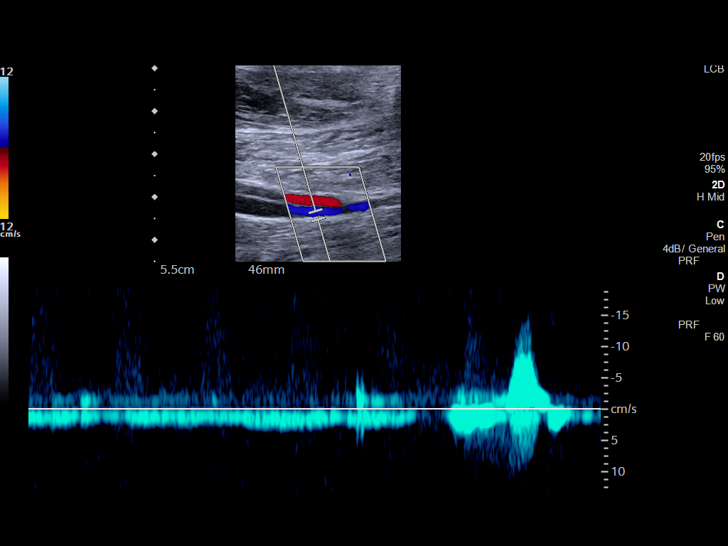
[im 54/60]
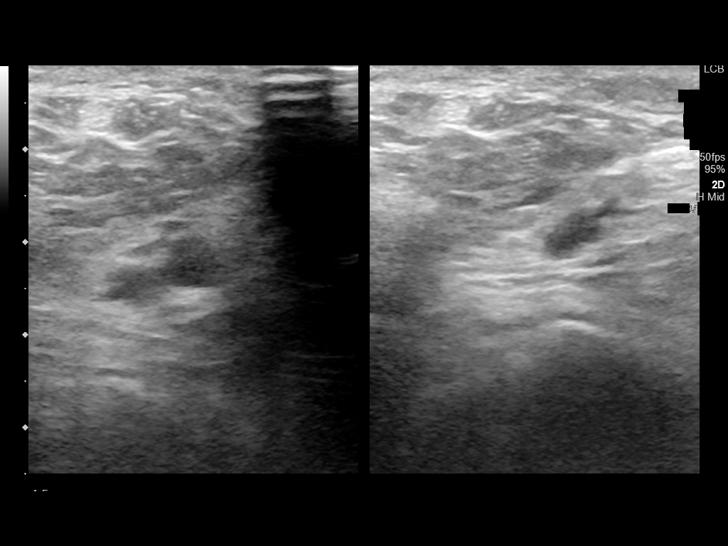
[im 60/60]
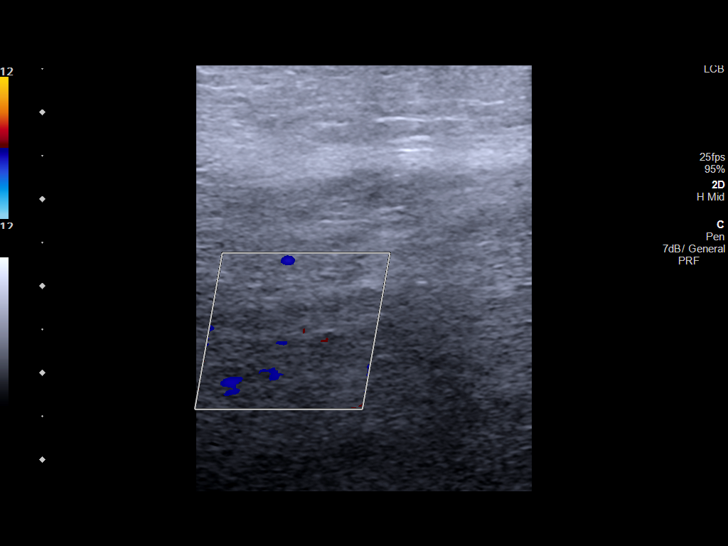

[13 of 24 positions shown; findings below may reference images not displayed]

FINDINGS: RIGHT LOWER EXTREMITY

Common Femoral Vein: No evidence of thrombus. Normal
compressibility, respiratory phasicity and response to augmentation.

Saphenofemoral Junction: No evidence of thrombus. Normal
compressibility and flow on color Doppler imaging.

Profunda Femoral Vein: No evidence of thrombus. Normal
compressibility and flow on color Doppler imaging.

Femoral Vein: No evidence of thrombus. Normal compressibility,
respiratory phasicity and response to augmentation.

Popliteal Vein: No evidence of thrombus. Normal compressibility,
respiratory phasicity and response to augmentation.

Calf Veins: No evidence of thrombus. Normal compressibility and flow
on color Doppler imaging.

Superficial Great Saphenous Vein: No evidence of thrombus. Normal
compressibility.

Venous Reflux:  None.

Other Findings:  None.

LEFT LOWER EXTREMITY

Common Femoral Vein: No evidence of thrombus. Normal
compressibility, respiratory phasicity and response to augmentation.

Saphenofemoral Junction: No evidence of thrombus. Normal
compressibility and flow on color Doppler imaging.

Profunda Femoral Vein: No evidence of thrombus. Normal
compressibility and flow on color Doppler imaging.

Femoral Vein: No evidence of thrombus. Normal compressibility,
respiratory phasicity and response to augmentation.

Popliteal Vein: No evidence of thrombus. Normal compressibility,
respiratory phasicity and response to augmentation.

Calf Veins: Appear patent where imaged.

Superficial Great Saphenous Vein: No evidence of thrombus. Normal
compressibility.

Venous Reflux:  None.

Other Findings:  None.
IMPRESSION: No evidence of DVT within either lower extremity.

## 2021-06-11 IMAGING — US US EXTREM UP*L* COMP
1 series · 11 of 11 positions shown · non-contrast
Comparison: MRI dated 09/07/2019

CLINICAL DATA: Status post repair of rotator cuff tear on
10/19/2019.

EXAM:
ULTRASOUND LEFT UPPER EXTREMITY COMPLETE
TECHNIQUE: Ultrasound examination was performed including evaluation of the
muscles, tendons, joint, and adjacent soft tissues.

[Series 1: us extrem up*left* comp · 11 acquisitions, 11 frames shown]
[im 1/11]
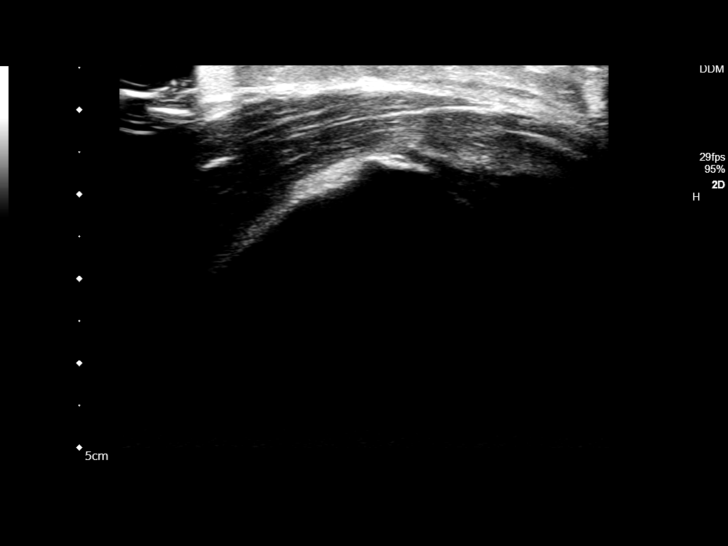
[im 2/11]
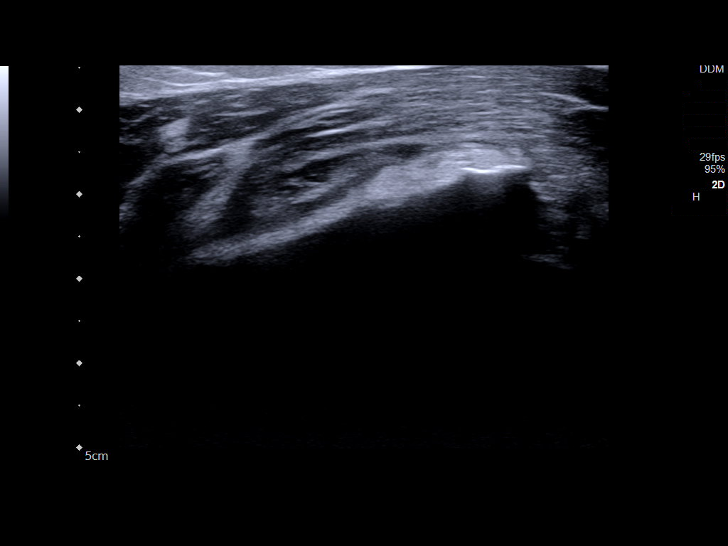
[im 3/11]
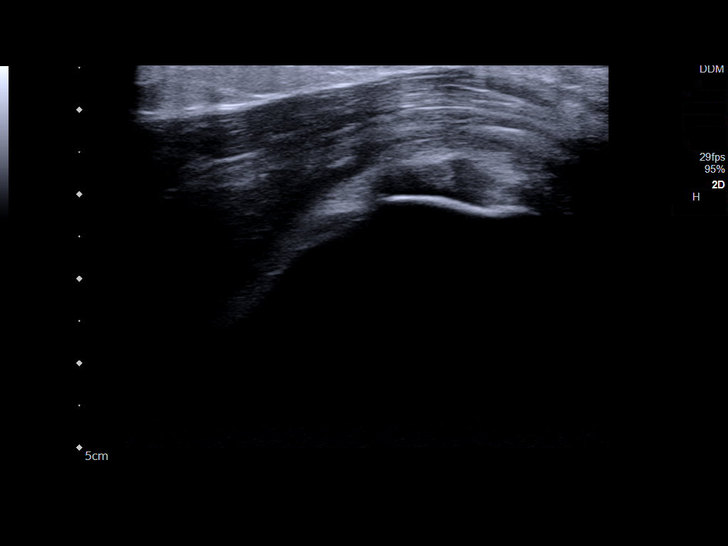
[im 4/11]
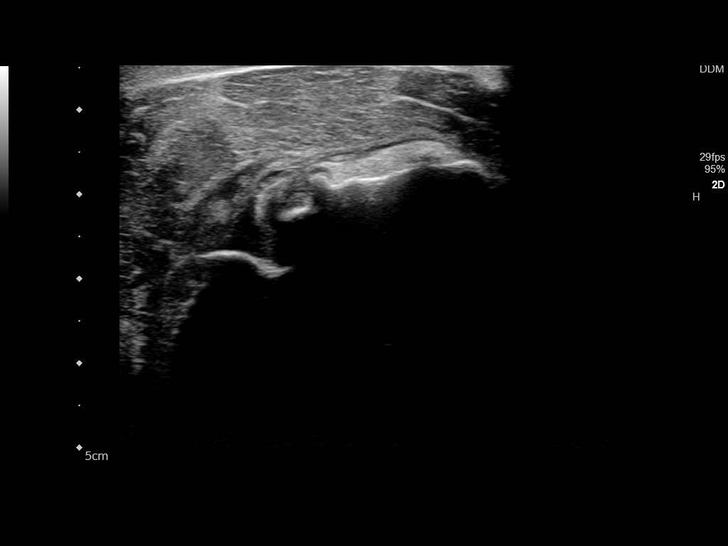
[im 5/11]
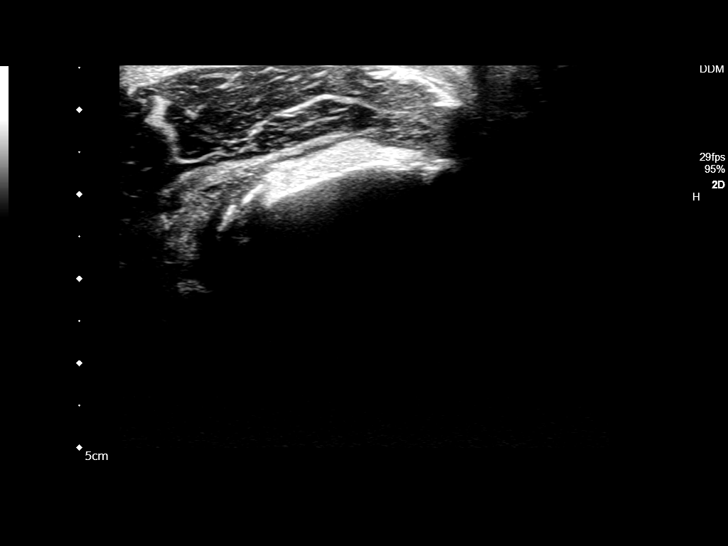
[im 6/11]
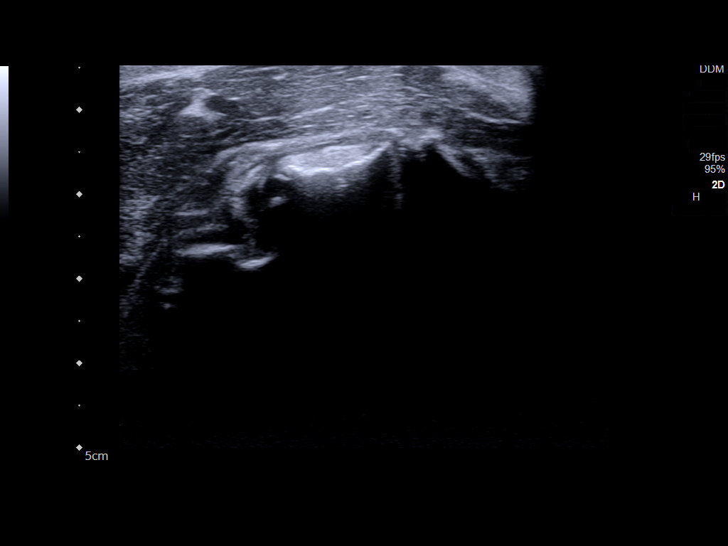
[im 7/11]
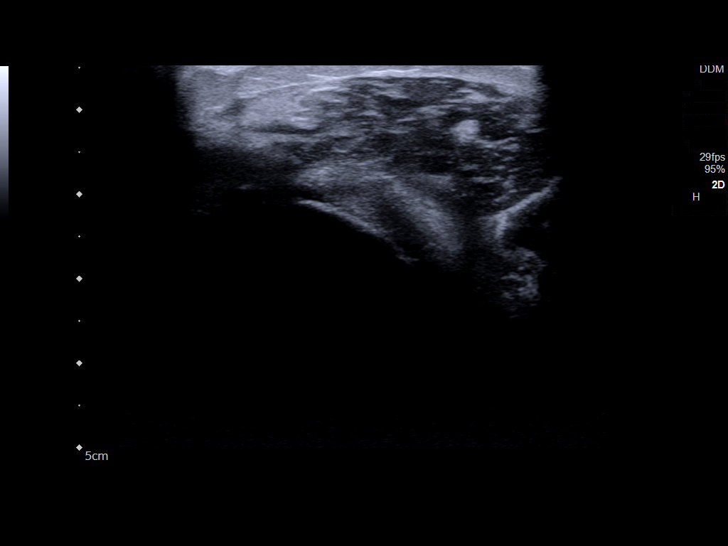
[im 8/11]
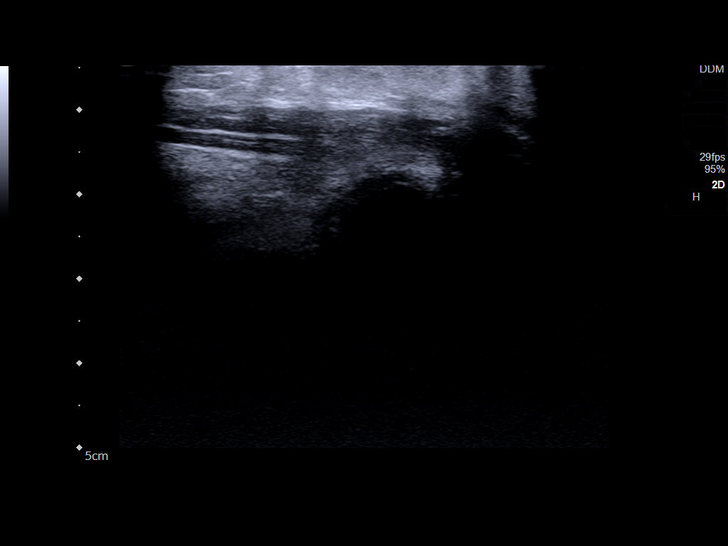
[im 9/11]
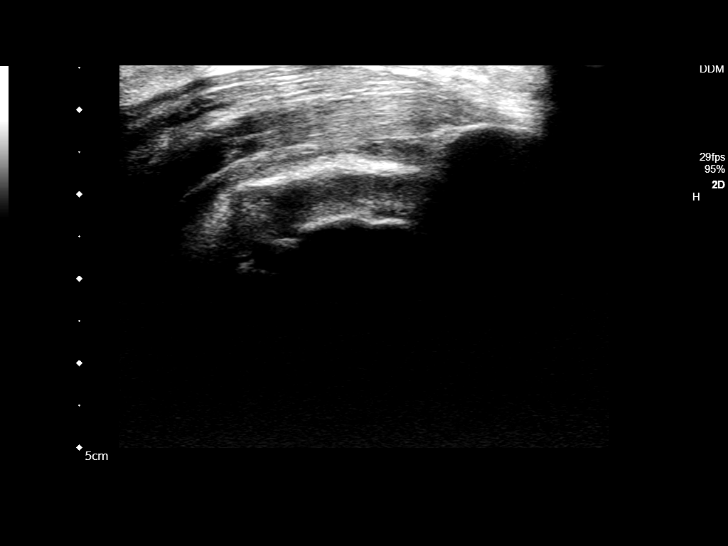
[im 10/11]
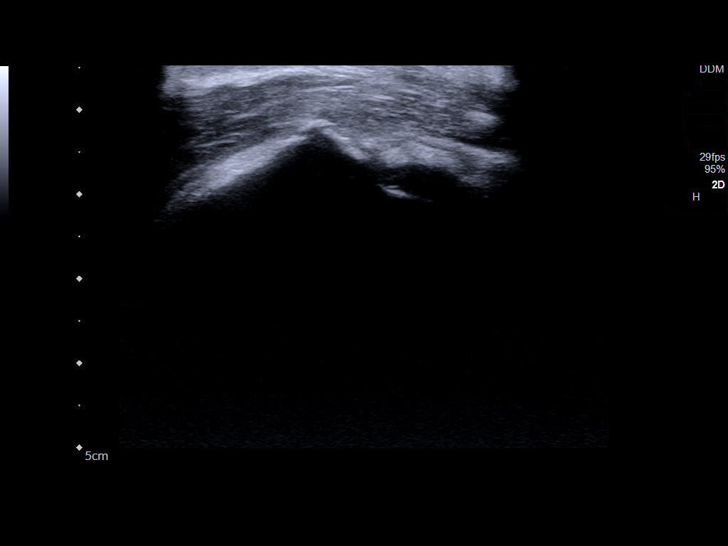
[im 11/11]
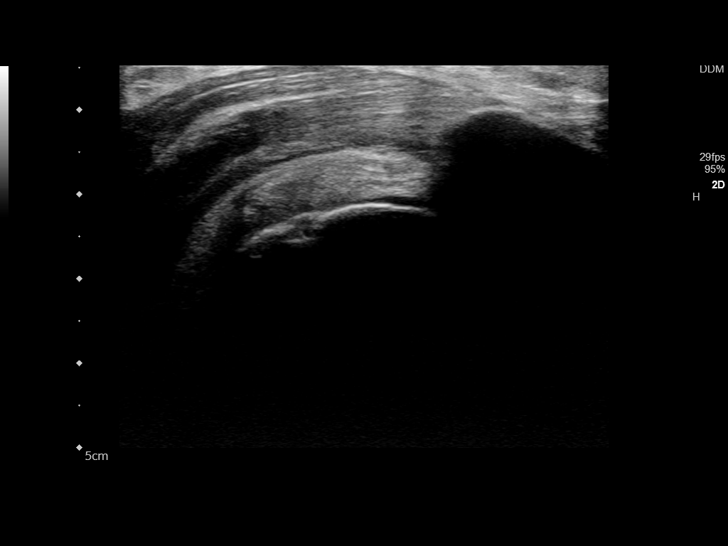

[11 of 11 positions shown; findings below may reference images not displayed]

FINDINGS: The visualized portions of the rotator cuff appear to be intact. No
appreciable fluid in the subacromial or subdeltoid bursae. No
appreciable glenohumeral joint effusion.
IMPRESSION: The repaired rotator cuff appears to be intact.

## 2021-06-12 IMAGING — US US CAROTID DUPLEX BILAT
1 series · 13 of 24 positions shown · non-contrast
Comparison: None.

CLINICAL DATA: Hypertension, stroke, diabetes

EXAM:
BILATERAL CAROTID DUPLEX ULTRASOUND
TECHNIQUE: Gray scale imaging, color Doppler and duplex ultrasound were
performed of bilateral carotid and vertebral arteries in the neck.

[Series 1: us carotid duplex bilat · 13 of 66 slices shown]
[im 1/66]
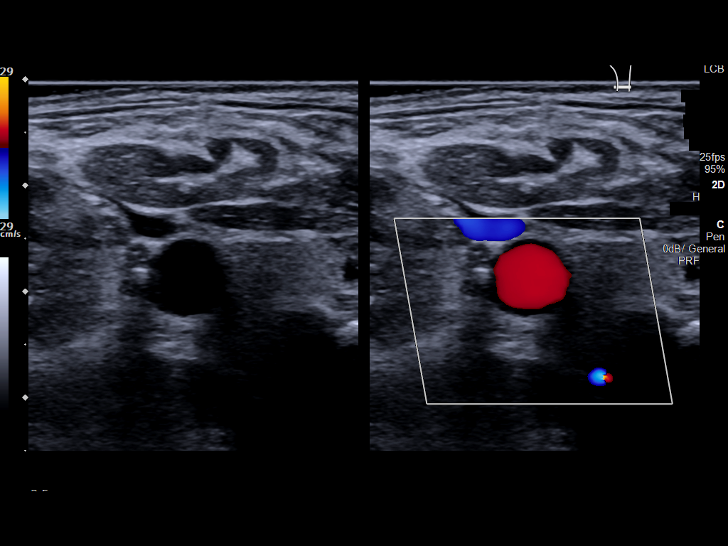
[im 6/66]
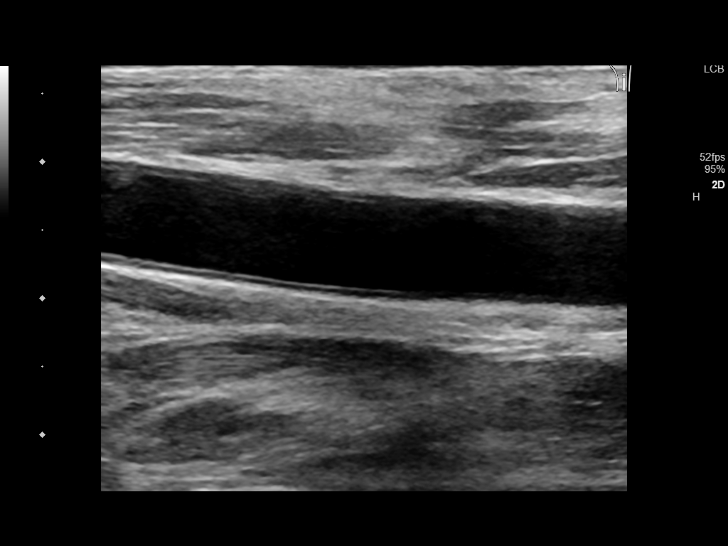
[im 12/66]
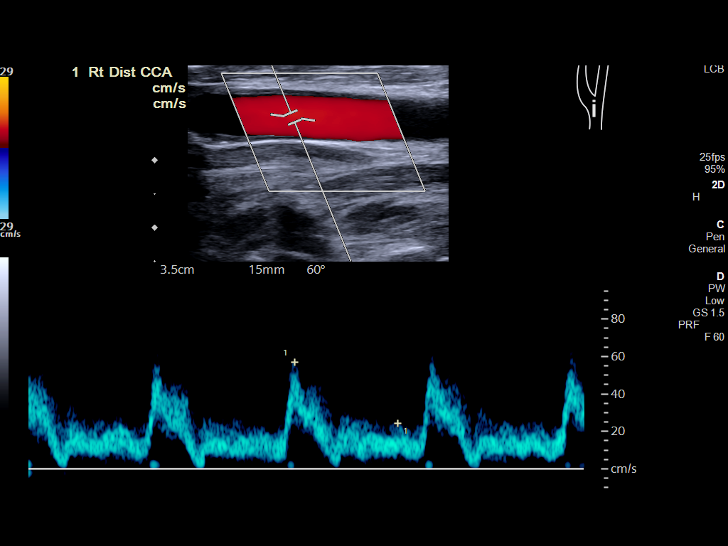
[im 17/66]
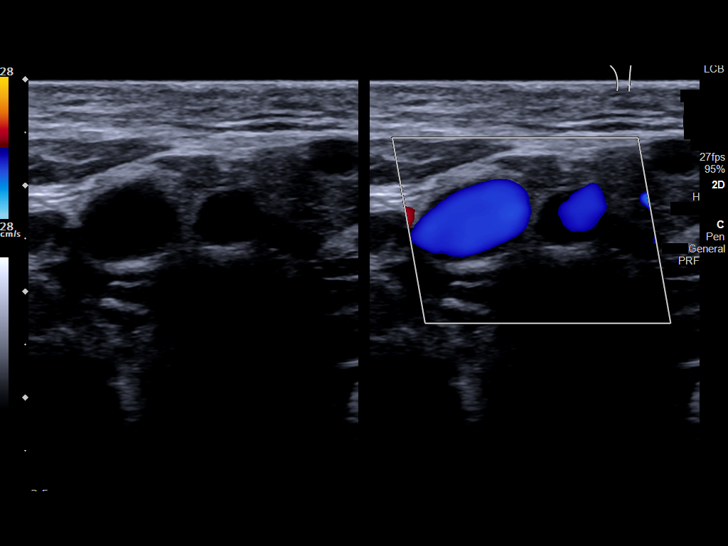
[im 23/66]
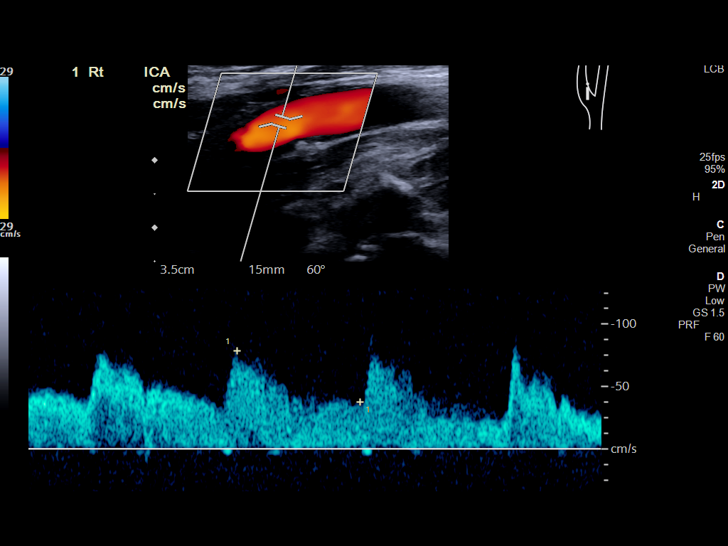
[im 29/66]
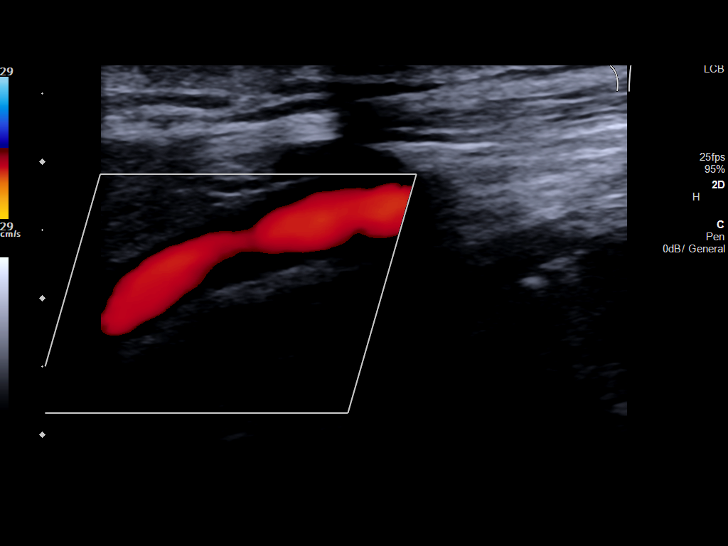
[im 34/66]
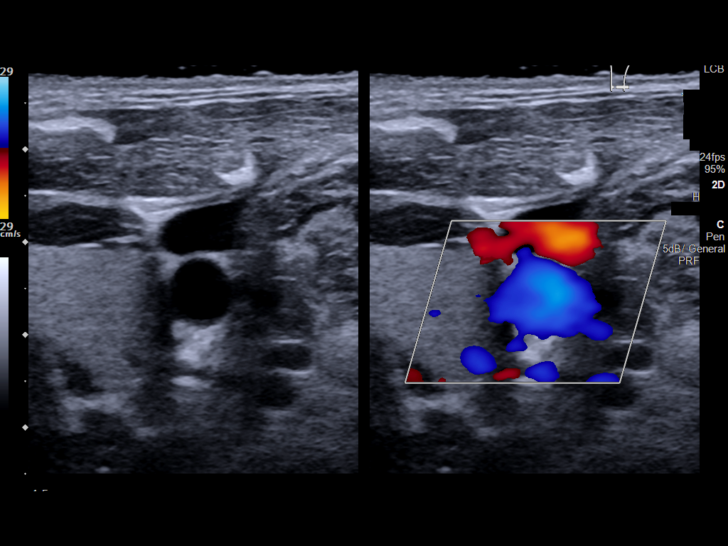
[im 37/66]
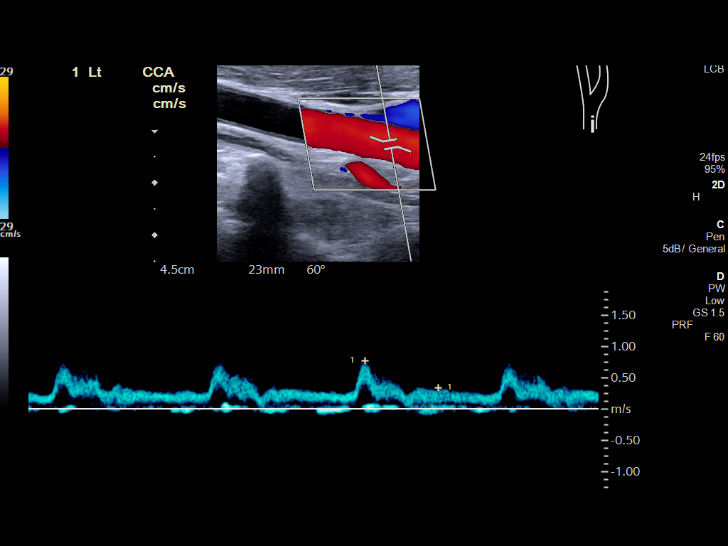
[im 43/66]
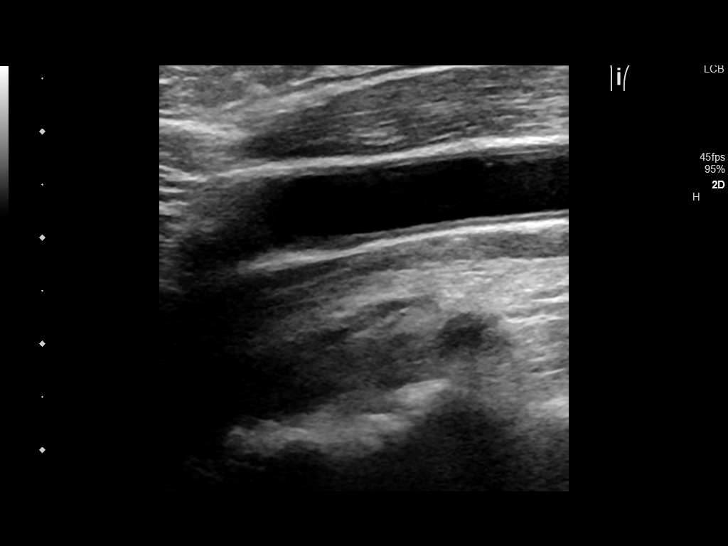
[im 49/66]
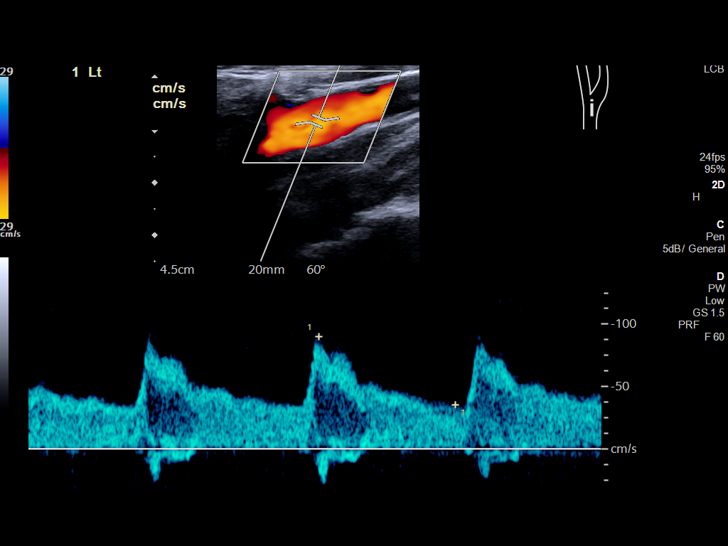
[im 54/66]
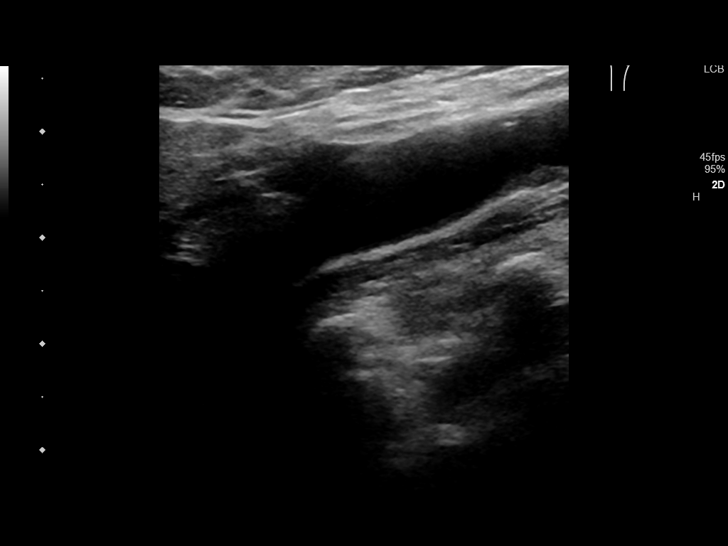
[im 60/66]
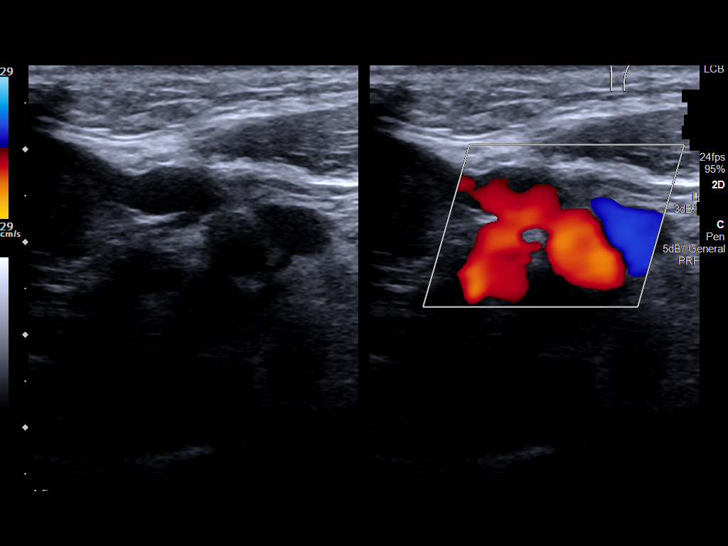
[im 66/66]
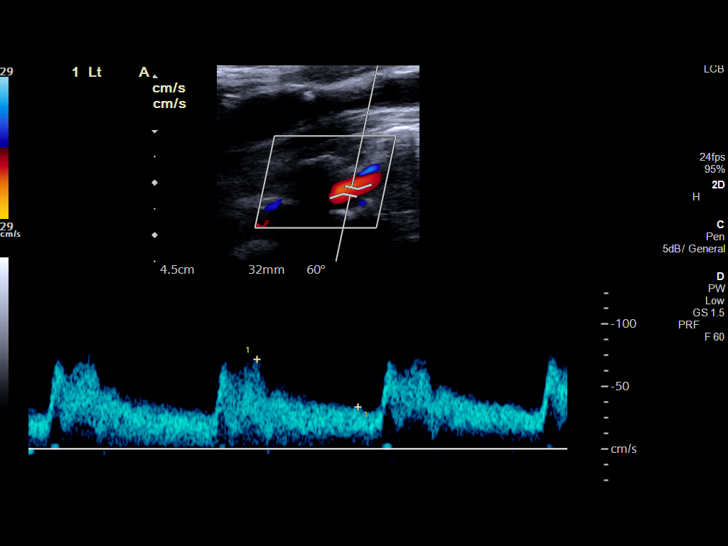

[13 of 24 positions shown; findings below may reference images not displayed]

FINDINGS: Criteria: Quantification of carotid stenosis is based on velocity
parameters that correlate the residual internal carotid diameter
with NASCET-based stenosis levels, using the diameter of the distal
internal carotid lumen as the denominator for stenosis measurement.

The following velocity measurements were obtained:

RIGHT

ICA: 104/23 cm/sec

CCA: 76/27 cm/sec

SYSTOLIC ICA/CCA RATIO:

ECA: 72 cm/sec

LEFT

ICA: 112/55 cm/sec

CCA: 78/27 cm/sec

SYSTOLIC ICA/CCA RATIO:

ECA: 94 cm/sec

RIGHT CAROTID ARTERY: Minor echogenic shadowing plaque formation. No
hemodynamically significant right ICA stenosis, velocity elevation,
or turbulent flow. Degree of narrowing less than 50%.

RIGHT VERTEBRAL ARTERY:  Antegrade

LEFT CAROTID ARTERY: Similar scattered minor echogenic plaque
formation. No hemodynamically significant left ICA stenosis,
velocity elevation, or turbulent flow.

LEFT VERTEBRAL ARTERY:  Antegrade
IMPRESSION: Minor carotid atherosclerosis. No hemodynamically significant ICA
stenosis. Degree of narrowing less than 50% bilaterally by
ultrasound criteria.

Patent antegrade vertebral flow bilaterally

## 2021-06-23 DIAGNOSIS — Z794 Long term (current) use of insulin: Secondary | ICD-10-CM | POA: Diagnosis not present

## 2021-06-23 DIAGNOSIS — E109 Type 1 diabetes mellitus without complications: Secondary | ICD-10-CM | POA: Diagnosis not present

## 2021-07-08 DIAGNOSIS — G8220 Paraplegia, unspecified: Secondary | ICD-10-CM | POA: Diagnosis not present

## 2021-07-11 DIAGNOSIS — Z794 Long term (current) use of insulin: Secondary | ICD-10-CM | POA: Diagnosis not present

## 2021-07-11 DIAGNOSIS — L97522 Non-pressure chronic ulcer of other part of left foot with fat layer exposed: Secondary | ICD-10-CM | POA: Diagnosis not present

## 2021-07-11 DIAGNOSIS — E1140 Type 2 diabetes mellitus with diabetic neuropathy, unspecified: Secondary | ICD-10-CM | POA: Diagnosis not present

## 2021-07-11 DIAGNOSIS — B351 Tinea unguium: Secondary | ICD-10-CM | POA: Diagnosis not present

## 2021-07-17 ENCOUNTER — Other Ambulatory Visit: Payer: Self-pay | Admitting: Internal Medicine

## 2021-07-21 ENCOUNTER — Encounter: Payer: Self-pay | Admitting: Internal Medicine

## 2021-07-22 MED ORDER — OMEPRAZOLE 20 MG PO CPDR: 20.0000 mg | DELAYED_RELEASE_CAPSULE | Freq: Every day | ORAL | 3 refills | Status: DC

## 2021-07-22 NOTE — Telephone Encounter (Signed)
Rx sent in for omeprazole.

## 2021-07-24 ENCOUNTER — Other Ambulatory Visit: Payer: Self-pay | Admitting: Internal Medicine

## 2021-07-24 ENCOUNTER — Encounter: Payer: Self-pay | Admitting: Pharmacist

## 2021-07-24 MED ORDER — ROSUVASTATIN CALCIUM 5 MG PO TABS
ORAL_TABLET | ORAL | 1 refills | Status: DC
Start: 2021-07-24 — End: 2022-10-12

## 2021-07-24 NOTE — Progress Notes (Signed)
[name])                                            [place] Team                                        Statin Quality Measure Assessment    07/24/2021  [name] 19-Sep-1971 [id number]  Dr. Nicki Reaper,   I am a [facility] clinical pharmacist that reviews patients for statin quality initiatives.     Per review of chart and payor information, patient has a diagnosis of diabetes but is not currently filling a statin prescription.  This places patient into the SUPD (Statin Use In Patients with Diabetes) measure for CMS.    Mr. [name] was last prescribed rosuvastatin 5 mg take 1 tablet by mouth weekly #12 with 0 refills. A new script will need to be written for Mr. [name] to remain on statin therapy.   The ASCVD Risk score [name] Bussing DC Jr., et al., 2013) failed to calculate for the following reasons:   The patient has a prior MI or stroke diagnosis 02/27/2021     Component Value Date/Time   CHOL 153 02/27/2021 1022   TRIG 69.0 02/27/2021 1022   HDL 64.60 02/27/2021 1022   CHOLHDL 2 02/27/2021 1022   VLDL 13.8 02/27/2021 1022   LDLCALC 75 02/27/2021 1022   Recommendation:  Please consider issuing a new prescription for the rosuvastatin 5 mg take 1 tablet by mouth weekly #12 with refills.   Thank you,   Loretha Brasil, PharmD [facility] Clinical Pharmacist Direct Dial: [phone]

## 2021-07-24 NOTE — Progress Notes (Signed)
Rx ok'd for crestor #12 with one refill.

## 2021-07-25 DIAGNOSIS — E1140 Type 2 diabetes mellitus with diabetic neuropathy, unspecified: Secondary | ICD-10-CM | POA: Diagnosis not present

## 2021-07-25 DIAGNOSIS — L97522 Non-pressure chronic ulcer of other part of left foot with fat layer exposed: Secondary | ICD-10-CM | POA: Diagnosis not present

## 2021-07-25 DIAGNOSIS — Z794 Long term (current) use of insulin: Secondary | ICD-10-CM | POA: Diagnosis not present

## 2021-07-29 ENCOUNTER — Encounter: Payer: Self-pay | Admitting: Internal Medicine

## 2021-07-29 DIAGNOSIS — E1069 Type 1 diabetes mellitus with other specified complication: Secondary | ICD-10-CM

## 2021-08-01 NOTE — Addendum Note (Signed)
Addended by: Lars Masson on: 08/01/2021 07:58 AM   Modules accepted: Orders

## 2021-08-05 DIAGNOSIS — E109 Type 1 diabetes mellitus without complications: Secondary | ICD-10-CM | POA: Diagnosis not present

## 2021-08-05 DIAGNOSIS — Z87440 Personal history of urinary (tract) infections: Secondary | ICD-10-CM | POA: Diagnosis not present

## 2021-08-05 DIAGNOSIS — R339 Retention of urine, unspecified: Secondary | ICD-10-CM | POA: Diagnosis not present

## 2021-08-05 DIAGNOSIS — G8250 Quadriplegia, unspecified: Secondary | ICD-10-CM | POA: Diagnosis not present

## 2021-08-08 DIAGNOSIS — G8220 Paraplegia, unspecified: Secondary | ICD-10-CM | POA: Diagnosis not present

## 2021-08-12 DIAGNOSIS — G8250 Quadriplegia, unspecified: Secondary | ICD-10-CM | POA: Diagnosis not present

## 2021-08-12 DIAGNOSIS — Z87440 Personal history of urinary (tract) infections: Secondary | ICD-10-CM | POA: Diagnosis not present

## 2021-08-12 DIAGNOSIS — E109 Type 1 diabetes mellitus without complications: Secondary | ICD-10-CM | POA: Diagnosis not present

## 2021-08-12 DIAGNOSIS — R339 Retention of urine, unspecified: Secondary | ICD-10-CM | POA: Diagnosis not present

## 2021-08-15 DIAGNOSIS — Z794 Long term (current) use of insulin: Secondary | ICD-10-CM | POA: Diagnosis not present

## 2021-08-15 DIAGNOSIS — L97522 Non-pressure chronic ulcer of other part of left foot with fat layer exposed: Secondary | ICD-10-CM | POA: Diagnosis not present

## 2021-08-15 DIAGNOSIS — E1140 Type 2 diabetes mellitus with diabetic neuropathy, unspecified: Secondary | ICD-10-CM | POA: Diagnosis not present

## 2021-08-18 DIAGNOSIS — R339 Retention of urine, unspecified: Secondary | ICD-10-CM | POA: Diagnosis not present

## 2021-08-18 DIAGNOSIS — G8250 Quadriplegia, unspecified: Secondary | ICD-10-CM | POA: Diagnosis not present

## 2021-08-18 DIAGNOSIS — E109 Type 1 diabetes mellitus without complications: Secondary | ICD-10-CM | POA: Diagnosis not present

## 2021-09-09 ENCOUNTER — Ambulatory Visit: Payer: Medicare Other

## 2021-09-09 ENCOUNTER — Telehealth: Payer: Self-pay

## 2021-09-09 NOTE — Telephone Encounter (Signed)
Unable to reach patient on preferred number for scheduled AWV. Left message to reschedule.

## 2021-09-09 NOTE — Telephone Encounter (Signed)
Opened in error

## 2021-09-11 ENCOUNTER — Encounter: Payer: Self-pay | Admitting: General Surgery

## 2021-09-14 ENCOUNTER — Encounter: Payer: Self-pay | Admitting: Internal Medicine

## 2021-09-14 DIAGNOSIS — E1069 Type 1 diabetes mellitus with other specified complication: Secondary | ICD-10-CM

## 2021-09-14 DIAGNOSIS — G8220 Paraplegia, unspecified: Secondary | ICD-10-CM

## 2021-09-14 DIAGNOSIS — L89100 Pressure ulcer of unspecified part of back, unstageable: Secondary | ICD-10-CM

## 2021-09-16 ENCOUNTER — Encounter: Payer: Self-pay | Admitting: Internal Medicine

## 2021-09-16 NOTE — Telephone Encounter (Signed)
Has he been taking twice a day?  Is he having worsening symptoms?

## 2021-09-17 DIAGNOSIS — Z87440 Personal history of urinary (tract) infections: Secondary | ICD-10-CM | POA: Diagnosis not present

## 2021-09-17 DIAGNOSIS — E109 Type 1 diabetes mellitus without complications: Secondary | ICD-10-CM | POA: Diagnosis not present

## 2021-09-17 DIAGNOSIS — R339 Retention of urine, unspecified: Secondary | ICD-10-CM | POA: Diagnosis not present

## 2021-09-17 DIAGNOSIS — G8250 Quadriplegia, unspecified: Secondary | ICD-10-CM | POA: Diagnosis not present

## 2021-09-17 MED ORDER — OMEPRAZOLE 20 MG PO CPDR
20.0000 mg | DELAYED_RELEASE_CAPSULE | Freq: Two times a day (BID) | ORAL | 2 refills | Status: DC
Start: 2021-09-17 — End: 2022-01-02

## 2021-09-17 NOTE — Telephone Encounter (Signed)
Spoke with patient and discussed this with you. He has had them come out for this in the past. This is a new sore on his bottom. Tried to manage on his own. Needs [facility] to come out and evaluate. Let me know if I need to do anything else.

## 2021-09-17 NOTE — Telephone Encounter (Signed)
Rx sent in for prilosec 50m bid #60 with 2 refills.

## 2021-09-17 NOTE — Telephone Encounter (Signed)
Order placed for home health referral.   Prefers [facility]. I forgot to put this on order.  Thanks.

## 2021-09-17 NOTE — Telephone Encounter (Signed)
Patient has been taking omeprazole 20 mg bid long term. Was buying OTC and did not specify that he was doing bid when he asked to have rx sent in. NO acute or worsening symptoms. Ok to send in rx for him?

## 2021-09-18 NOTE — Telephone Encounter (Signed)
Can this be a virtual (face to face on video) or does it have to be in office.

## 2021-09-18 NOTE — Telephone Encounter (Signed)
Thank you.    Dr Nicki Reaper

## 2021-09-19 NOTE — Telephone Encounter (Signed)
See note.  Needs an appt.  Can schedule Monday at 9:30.

## 2021-09-19 NOTE — Telephone Encounter (Signed)
Patient scheduled.

## 2021-09-22 ENCOUNTER — Telehealth (INDEPENDENT_AMBULATORY_CARE_PROVIDER_SITE_OTHER): Payer: Medicare Other | Admitting: Internal Medicine

## 2021-09-22 DIAGNOSIS — Z794 Long term (current) use of insulin: Secondary | ICD-10-CM | POA: Diagnosis not present

## 2021-09-22 DIAGNOSIS — R159 Full incontinence of feces: Secondary | ICD-10-CM

## 2021-09-22 DIAGNOSIS — L98491 Non-pressure chronic ulcer of skin of other sites limited to breakdown of skin: Secondary | ICD-10-CM

## 2021-09-22 DIAGNOSIS — E109 Type 1 diabetes mellitus without complications: Secondary | ICD-10-CM | POA: Diagnosis not present

## 2021-09-22 DIAGNOSIS — G8220 Paraplegia, unspecified: Secondary | ICD-10-CM | POA: Diagnosis not present

## 2021-09-22 DIAGNOSIS — E1069 Type 1 diabetes mellitus with other specified complication: Secondary | ICD-10-CM
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
## 2021-09-23 MED ORDER — HYDROCORTISONE ACETATE 25 MG RE SUPP: 25.0000 mg | Freq: Two times a day (BID) | RECTAL | 2 refills | Status: DC | PRN

## 2021-09-24 ENCOUNTER — Other Ambulatory Visit: Payer: Self-pay | Admitting: Internal Medicine

## 2021-09-26 DIAGNOSIS — Z794 Long term (current) use of insulin: Secondary | ICD-10-CM | POA: Diagnosis not present

## 2021-09-26 DIAGNOSIS — E1140 Type 2 diabetes mellitus with diabetic neuropathy, unspecified: Secondary | ICD-10-CM | POA: Diagnosis not present

## 2021-09-26 DIAGNOSIS — L97521 Non-pressure chronic ulcer of other part of left foot limited to breakdown of skin: Secondary | ICD-10-CM | POA: Diagnosis not present

## 2021-09-29 ENCOUNTER — Encounter: Payer: Self-pay | Admitting: Internal Medicine

## 2021-09-29 NOTE — Telephone Encounter (Signed)
Has had his face to face.  Order has been placed for home health.  Can they go out and evaluate.  Also, if able would like labs to be drawn.

## 2021-09-29 NOTE — Assessment & Plan Note (Signed)
Reports ulcer - around rectum.  Has been using medication, but feels is getting worse.  Request homehealth to evaluated and treat.

## 2021-09-29 NOTE — Assessment & Plan Note (Signed)
Able to do ADLs now. Hard to get to appts.  Home health to evaluated wound/ulceration.

## 2021-09-29 NOTE — Assessment & Plan Note (Signed)
Has dexcom.  Follow met b and a1c.

## 2021-09-29 NOTE — Assessment & Plan Note (Signed)
Saw surgery. Note reviewed.  Home health as outlined.

## 2021-10-01 NOTE — Telephone Encounter (Signed)
I did the virtual face to face appt 09/22/21.  Let me know if anything more needs to be done.

## 2021-10-01 NOTE — Telephone Encounter (Signed)
Can you help me with this.  The face to face has been done.  Let me know if I need to do anything more for the home health referral.  Thanks.

## 2021-10-02 NOTE — Telephone Encounter (Signed)
noted 

## 2021-10-02 NOTE — Telephone Encounter (Signed)
See other note

## 2021-10-02 NOTE — Telephone Encounter (Signed)
Virtual face to face was done on 10/17

## 2021-10-13 ENCOUNTER — Encounter: Payer: Self-pay | Admitting: Internal Medicine

## 2021-10-14 ENCOUNTER — Telehealth: Payer: Self-pay

## 2021-10-14 DIAGNOSIS — R109 Unspecified abdominal pain: Secondary | ICD-10-CM

## 2021-10-14 DIAGNOSIS — Z1211 Encounter for screening for malignant neoplasm of colon: Secondary | ICD-10-CM

## 2021-10-14 NOTE — Telephone Encounter (Signed)
Toth, [name] Rodney "[name]"  [facility] (supporting Einar Pheasant, MD) 6 minutes ago (3:01 PM)   First I got a letter from [facility] saying I was due for my colonoscopy. Second I've been having stomach pains that I can explain and bubble gut.    Lars Masson, LPN  Stifter, [name] Rodney "[name]" 7 hours ago (7:24 AM)   I can place the referral for you, I just need the reason for referral so I can send it over to them.     Einar Pheasant, MD  Lars Masson, LPN 12 hours ago (0:37 AM)   Ok to place referral.  Need to document reason.       Note    You routed conversation to Einar Pheasant, MD Yesterday (2:10 PM)   Eliasen, [name] Rodney "[name]"  [facility] (supporting Einar Pheasant, MD) Yesterday (2:08 PM)   I called [facility] GI department to set up an appointment, they said it's been more than 3 years so I would have to get a referral from you. In that case could you refer me to Dr Verl Blalock in [place]. Thanks

## 2021-10-14 NOTE — Telephone Encounter (Signed)
Ok to place referral.  Need to document reason.

## 2021-10-15 NOTE — Telephone Encounter (Signed)
Order placed for GI referral.   

## 2021-10-15 NOTE — Telephone Encounter (Signed)
I have pended this referral to GI for you to sign. It is time for his colonoscopy and some abd pain. Wants to see Dr Allen Norris at [facility] in [place]. Everything should be entered. Just wanted you to review prior to signing.

## 2021-10-15 NOTE — Addendum Note (Signed)
Addended by: Lars Masson on: 10/15/2021 04:02 PM   Modules accepted: Orders

## 2021-10-15 NOTE — Addendum Note (Signed)
Addended by: Alisa Graff on: 10/15/2021 07:01 PM   Modules accepted: Orders

## 2021-10-17 ENCOUNTER — Other Ambulatory Visit: Payer: Self-pay | Admitting: Internal Medicine

## 2021-10-20 DIAGNOSIS — L97529 Non-pressure chronic ulcer of other part of left foot with unspecified severity: Secondary | ICD-10-CM | POA: Diagnosis not present

## 2021-10-20 DIAGNOSIS — E109 Type 1 diabetes mellitus without complications: Secondary | ICD-10-CM | POA: Diagnosis not present

## 2021-10-20 DIAGNOSIS — Z794 Long term (current) use of insulin: Secondary | ICD-10-CM | POA: Diagnosis not present

## 2021-10-20 DIAGNOSIS — E10621 Type 1 diabetes mellitus with foot ulcer: Secondary | ICD-10-CM | POA: Diagnosis not present

## 2021-10-22 ENCOUNTER — Telehealth: Payer: Self-pay | Admitting: Pharmacist

## 2021-10-22 NOTE — Telephone Encounter (Signed)
This patient is appearing on the insurance-provided list for being at risk of failing the adherence measure for Statin Therapy for Patients with Cardiovascular Disease (SPC) medications this calendar year.   Patient was prescribed rosuvastatin 5 mg weekly, last refilled 07/24/21. [place] and confirmed this medication has not been picked up.   Called patient. Left voicemail for him to return my call at his convenience.

## 2021-10-22 NOTE — Telephone Encounter (Signed)
Noted! Thank you

## 2021-10-22 NOTE — Telephone Encounter (Signed)
Patient returned call. Discussed benefit of statin therapy in diabetes, even given relatively well controlled LDL. He notes remembering to take something once a week is hard. Discussed trying three days weekly or daily if patient prefers, but that it is important to stick to a prescribed regimen. He is agreeable to restarting rosuvastatin 5 mg once weekly. Discussed setting an alarm to remember to take once a week.   [facility], asked that they fill. They will fill today.   F/u with PCP in ~ 2 weeks as previously scheduled. Routing to PCP for FYI.

## 2021-10-24 ENCOUNTER — Other Ambulatory Visit: Payer: Self-pay | Admitting: Internal Medicine

## 2021-10-28 DIAGNOSIS — R339 Retention of urine, unspecified: Secondary | ICD-10-CM | POA: Diagnosis not present

## 2021-10-28 DIAGNOSIS — Z87440 Personal history of urinary (tract) infections: Secondary | ICD-10-CM | POA: Diagnosis not present

## 2021-10-28 DIAGNOSIS — L89893 Pressure ulcer of other site, stage 3: Secondary | ICD-10-CM | POA: Diagnosis not present

## 2021-10-28 DIAGNOSIS — E109 Type 1 diabetes mellitus without complications: Secondary | ICD-10-CM | POA: Diagnosis not present

## 2021-10-28 DIAGNOSIS — G8250 Quadriplegia, unspecified: Secondary | ICD-10-CM | POA: Diagnosis not present

## 2021-11-03 ENCOUNTER — Other Ambulatory Visit: Payer: Self-pay

## 2021-11-03 ENCOUNTER — Ambulatory Visit (INDEPENDENT_AMBULATORY_CARE_PROVIDER_SITE_OTHER): Payer: Medicare Other | Admitting: Internal Medicine

## 2021-11-03 ENCOUNTER — Telehealth: Payer: Self-pay

## 2021-11-03 DIAGNOSIS — G903 Multi-system degeneration of the autonomic nervous system: Secondary | ICD-10-CM | POA: Diagnosis not present

## 2021-11-03 DIAGNOSIS — E1069 Type 1 diabetes mellitus with other specified complication: Secondary | ICD-10-CM

## 2021-11-03 DIAGNOSIS — L98491 Non-pressure chronic ulcer of skin of other sites limited to breakdown of skin: Secondary | ICD-10-CM
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
## 2021-11-03 NOTE — Telephone Encounter (Signed)
Left message for Suanne Marker at [facility] regarding referral.  CB # for Suanne Marker [phone]

## 2021-11-04 ENCOUNTER — Encounter: Payer: Self-pay | Admitting: Internal Medicine

## 2021-11-04 ENCOUNTER — Telehealth: Payer: Self-pay | Admitting: Internal Medicine

## 2021-11-04 NOTE — Assessment & Plan Note (Addendum)
Seeing Dr Gabriel Carina.  Planning for insulin pump. Declined labs today.

## 2021-11-04 NOTE — Telephone Encounter (Signed)
Spoke with [facility] Pt is not in their census. LM for [facility] with admissions and referrals to call back.

## 2021-11-04 NOTE — Assessment & Plan Note (Signed)
Overall appears to be stable.  Follow.  (has had intermittent issues with hypotension. Followed by cardiology).

## 2021-11-04 NOTE — Telephone Encounter (Signed)
Home health referral for wound care - need to confirm nurse will be evaluating.

## 2021-11-04 NOTE — Assessment & Plan Note (Signed)
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  Saw Dr Bary Castilla.  Note reviewed.  Planning to consult plastic surgery.  He has been changing dressings, etc - at home.  Have placed order for home health - wound care nurse - for further evaluation and treatment.  F/u regarding referral.

## 2021-11-05 NOTE — Telephone Encounter (Signed)
LM for Suanne Marker to return call.

## 2021-11-05 NOTE — Telephone Encounter (Signed)
Spoke with Suanne Marker and was advised that she only does in patient.  She gave me number for [facility] that does home health. [phone]. Left message for [place] to call me back.

## 2021-11-19 NOTE — Telephone Encounter (Signed)
LM for [place] to return call

## 2021-11-20 NOTE — Telephone Encounter (Signed)
Spoke with patient to see if he has heard anything from wound nurse. Patient has not. He is aware that we have been trying to contact them for weeks. Patient has an appt next week with wound clinic. He would like to disregard [facility] referral.

## 2021-11-21 ENCOUNTER — Other Ambulatory Visit: Payer: Self-pay | Admitting: Internal Medicine

## 2021-11-21 MED ORDER — INSULIN LISPRO (1 UNIT DIAL) 100 UNIT/ML (KWIKPEN): 3.0000 [IU] | PEN_INJECTOR | Freq: Every day | SUBCUTANEOUS | 0 refills | Status: DC

## 2021-11-27 ENCOUNTER — Encounter: Payer: Medicare Other | Attending: Physician Assistant | Admitting: Physician Assistant

## 2021-11-27 ENCOUNTER — Other Ambulatory Visit: Payer: Self-pay

## 2021-11-27 DIAGNOSIS — G8254 Quadriplegia, C5-C7 incomplete: Secondary | ICD-10-CM | POA: Diagnosis not present

## 2021-11-27 DIAGNOSIS — I5042 Chronic combined systolic (congestive) and diastolic (congestive) heart failure: Secondary | ICD-10-CM | POA: Diagnosis not present

## 2021-11-27 DIAGNOSIS — I110 Hypertensive heart disease with heart failure: Secondary | ICD-10-CM | POA: Insufficient documentation

## 2021-11-27 DIAGNOSIS — E10622 Type 1 diabetes mellitus with other skin ulcer: Secondary | ICD-10-CM | POA: Diagnosis not present

## 2021-11-27 DIAGNOSIS — J449 Chronic obstructive pulmonary disease, unspecified: Secondary | ICD-10-CM | POA: Diagnosis not present

## 2021-11-27 DIAGNOSIS — G8220 Paraplegia, unspecified: Secondary | ICD-10-CM | POA: Diagnosis not present

## 2021-11-27 DIAGNOSIS — L89153 Pressure ulcer of sacral region, stage 3: Secondary | ICD-10-CM | POA: Diagnosis not present

## 2021-11-28 DIAGNOSIS — L89153 Pressure ulcer of sacral region, stage 3: Secondary | ICD-10-CM | POA: Diagnosis not present

## 2021-11-28 NOTE — Progress Notes (Signed)
[name], [name] ([id number]) Visit Report for 11/27/2021 Allergy List Details Patient Name: [name], [name]. Date of Service: 11/27/2021 12:45 PM Medical Record Number: [id number] Patient Account Number: [id number] Date of Birth/Sex: 1971-06-26 (50 y.o. M) Treating RN: Carlene Coria Primary Care Adaleena Mooers: Einar Pheasant Other Clinician: Referring Toree Edling: Referral, Self Treating Tadashi Burkel/Extender: Skipper Cliche in Treatment: 0 Allergies Active Allergies Sulfa (Sulfonamide Antibiotics) Reaction: tongue swells Iodinated Contrast Media - IV Dye Allerfed (pseudoephedrine) Allergy Notes Electronic Signature(s) Signed: 11/28/2021 10:41:07 AM By: Carlene Coria RN Entered By: Carlene Coria on 11/27/2021 13:30:33 Mcguiness, Lahoma Rocker ([id number]) -------------------------------------------------------------------------------- Arrival Information Details Patient Name: [name]. Date of Service: 11/27/2021 12:45 PM Medical Record Number: [id number] Patient Account Number: [id number] Date of Birth/Sex: 1971/01/01 (50 y.o. M) Treating RN: Carlene Coria Primary Care Anissia Wessells: Einar Pheasant Other Clinician: Referring Bradely Rudin: Referral, Self Treating Rylea Selway/Extender: Skipper Cliche in Treatment: 0 Visit Information Patient Arrived: Wheel Chair Arrival Time: 13:09 Accompanied By: self Transfer Assistance: None Patient Identification Verified: Yes Secondary Verification Process Completed: Yes Patient Requires Transmission-Based Precautions: No Patient Has Alerts: No History Since Last Visit All ordered tests and consults were completed: No Added or deleted any medications: No Any new allergies or adverse reactions: No Had a fall or experienced change in activities of daily living that may affect risk of falls: No Signs or symptoms of abuse/neglect since last visito No Hospitalized since last visit: No Implantable device outside of the clinic excluding cellular tissue based  products placed in the center since last visit: No Pain Present Now: No Electronic Signature(s) Signed: 11/28/2021 10:41:07 AM By: Carlene Coria RN Entered By: Carlene Coria on 11/27/2021 13:20:18 Besser, Lahoma Rocker ([id number]) -------------------------------------------------------------------------------- Clinic Level of Care Assessment Details Patient Name: [name]. Date of Service: 11/27/2021 12:45 PM Medical Record Number: [id number] Patient Account Number: [id number] Date of Birth/Sex: July 19, 1971 (50 y.o. M) Treating RN: Carlene Coria Primary Care Debhora Titus: Einar Pheasant Other Clinician: Referring Clovia Reine: Referral, Self Treating Mallissa Lorenzen/Extender: Skipper Cliche in Treatment: 0 Clinic Level of Care Assessment Items TOOL 2 Quantity Score X - Use when only an EandM is performed on the INITIAL visit 1 0 ASSESSMENTS - Nursing Assessment / Reassessment X - General Physical Exam (combine w/ comprehensive assessment (listed just below) when performed on new 1 20 pt. evals) X- 1 25 Comprehensive Assessment (HX, ROS, Risk Assessments, Wounds Hx, etc.) ASSESSMENTS - Wound and Skin Assessment / Reassessment []  - Simple Wound Assessment / Reassessment - one wound 0 X- 2 5 Complex Wound Assessment / Reassessment - multiple wounds []  - 0 Dermatologic / Skin Assessment (not related to wound area) ASSESSMENTS - Ostomy and/or Continence Assessment and Care []  - Incontinence Assessment and Management 0 []  - 0 Ostomy Care Assessment and Management (repouching, etc.) PROCESS - Coordination of Care X - Simple Patient / Family Education for ongoing care 1 15 []  - 0 Complex (extensive) Patient / Family Education for ongoing care []  - 0 Staff obtains Programmer, systems, Records, Test Results / Process Orders []  - 0 Staff telephones HHA, Nursing Homes / Clarify orders / etc []  - 0 Routine Transfer to another Facility (non-emergent condition) []  - 0 Routine Hospital Admission (non-emergent  condition) []  - 0 New Admissions / Biomedical engineer / Ordering NPWT, Apligraf, etc. []  - 0 Emergency Hospital Admission (emergent condition) X- 1 10 Simple Discharge Coordination []  - 0 Complex (extensive) Discharge Coordination PROCESS - Special Needs []  - Pediatric / Minor Patient Management 0 []  - 0 Isolation Patient Management []  -  0 Hearing / Language / Visual special needs []  - 0 Assessment of Community assistance (transportation, D/C planning, etc.) []  - 0 Additional assistance / Altered mentation []  - 0 Support Surface(s) Assessment (bed, cushion, seat, etc.) INTERVENTIONS - Wound Cleansing / Measurement X - Wound Imaging (photographs - any number of wounds) 1 5 []  - 0 Wound Tracing (instead of photographs) []  - 0 Simple Wound Measurement - one wound X- 2 5 Complex Wound Measurement - multiple wounds Eisenbeis, Kolden K. ([id number]) []  - 0 Simple Wound Cleansing - one wound X- 1 5 Complex Wound Cleansing - multiple wounds INTERVENTIONS - Wound Dressings X - Small Wound Dressing one or multiple wounds 2 10 []  - 0 Medium Wound Dressing one or multiple wounds []  - 0 Large Wound Dressing one or multiple wounds []  - 0 Application of Medications - injection INTERVENTIONS - Miscellaneous []  - External ear exam 0 []  - 0 Specimen Collection (cultures, biopsies, blood, body fluids, etc.) []  - 0 Specimen(s) / Culture(s) sent or taken to Lab for analysis []  - 0 Patient Transfer (multiple staff / Civil Service fast streamer / Similar devices) []  - 0 Simple Staple / Suture removal (25 or less) []  - 0 Complex Staple / Suture removal (26 or more) []  - 0 Hypo / Hyperglycemic Management (close monitor of Blood Glucose) []  - 0 Ankle / Brachial Index (ABI) - do not check if billed separately Has the patient been seen at the hospital within the last three years: Yes Total Score: 120 Level Of Care: New/Established - Level 4 Electronic Signature(s) Signed: 11/28/2021 10:41:07 AM  By: Carlene Coria RN Entered By: Carlene Coria on 11/27/2021 14:14:29 Kurowski, Lahoma Rocker ([id number]) -------------------------------------------------------------------------------- Encounter Discharge Information Details Patient Name: [name]. Date of Service: 11/27/2021 12:45 PM Medical Record Number: [id number] Patient Account Number: [id number] Date of Birth/Sex: 1971/06/06 (50 y.o. M) Treating RN: Carlene Coria Primary Care Jakayden Cancio: Einar Pheasant Other Clinician: Referring Ilamae Geng: Referral, Self Treating Seville Brick/Extender: Skipper Cliche in Treatment: 0 Encounter Discharge Information Items Post Procedure Vitals Discharge Condition: Stable Temperature (F): 97.5 Ambulatory Status: Wheelchair Pulse (bpm): 59 Discharge Destination: Home Respiratory Rate (breaths/min): 18 Transportation: Private Auto Blood Pressure (mmHg): 189/111 Accompanied By: self Schedule Follow-up Appointment: Yes Clinical Summary of Care: Patient Declined Electronic Signature(s) Signed: 11/28/2021 10:41:07 AM By: Carlene Coria RN Entered By: Carlene Coria on 11/27/2021 14:22:38 Fickel, Lahoma Rocker ([id number]) -------------------------------------------------------------------------------- Lower Extremity Assessment Details Patient Name: [name]. Date of Service: 11/27/2021 12:45 PM Medical Record Number: [id number] Patient Account Number: [id number] Date of Birth/Sex: [birth year] (50 y.o. M) Treating RN: Carlene Coria Primary Care Avyonna Wagoner: Einar Pheasant Other Clinician: Referring Ameli Sangiovanni: Referral, Self Treating Rodarius Kichline/Extender: Skipper Cliche in Treatment: 0 Electronic Signature(s) Signed: 11/28/2021 10:41:07 AM By: Carlene Coria RN Entered By: Carlene Coria on 11/27/2021 13:29:27 Fialkowski, Lahoma Rocker ([id number]) -------------------------------------------------------------------------------- Multi Wound Chart Details Patient Name: [name]. Date of Service: 11/27/2021  12:45 PM Medical Record Number: [id number] Patient Account Number: [id number] Date of Birth/Sex: 02/28/1971 (50 y.o. M) Treating RN: Carlene Coria Primary Care Linsey Hirota: Einar Pheasant Other Clinician: Referring Elton Catalano: Referral, Self Treating Mickelle Goupil/Extender: Skipper Cliche in Treatment: 0 Vital Signs Height(in): 72 Pulse(bpm): 59 Weight(lbs): 175 Blood Pressure(mmHg): 189/111 Body Mass Index(BMI): 24 Temperature(F): 97.5 Respiratory Rate(breaths/min): 18 Photos: [N/A:N/A] Wound Location: Midline Sacrum Right Sacrum N/A Wounding Event: Gradually Appeared Gradually Appeared N/A Primary Etiology: Pressure Ulcer Pressure Ulcer N/A Comorbid History: Congestive Heart Failure, Type I Congestive Heart Failure, Type I N/A Diabetes, History of pressure Diabetes, History  of pressure wounds, Paraplegia wounds, Paraplegia Date Acquired: 05/07/2021 05/07/2021 N/A Weeks of Treatment: 0 0 N/A Wound Status: Open Open N/A Measurements L x W x D (cm) 3.5x2x0.4 1.5x0.5x0.3 N/A Area (cm) : 5.498 0.589 N/A Volume (cm) : 2.199 0.177 N/A Classification: Category/Stage III Category/Stage III N/A Exudate Amount: Medium Medium N/A Exudate Type: Serosanguineous Serosanguineous N/A Exudate Color: red, brown red, brown N/A Granulation Amount: Large (67-100%) Large (67-100%) N/A Granulation Quality: Red Red N/A Necrotic Amount: Small (1-33%) Small (1-33%) N/A Exposed Structures: Fat Layer (Subcutaneous Tissue): Fat Layer (Subcutaneous Tissue): N/A Yes Yes Fascia: No Fascia: No Tendon: No Tendon: No Muscle: No Muscle: No Joint: No Joint: No Bone: No Bone: No Epithelialization: None None N/A Treatment Notes Electronic Signature(s) Signed: 11/28/2021 10:41:07 AM By: Carlene Coria RN Entered By: Carlene Coria on 11/27/2021 14:07:36 Corsello, Lahoma Rocker ([id number]) -------------------------------------------------------------------------------- [place] Details Patient  Name: [name]. Date of Service: 11/27/2021 12:45 PM Medical Record Number: [id number] Patient Account Number: [id number] Date of Birth/Sex: [birth year] (50 y.o. M) Treating RN: Carlene Coria Primary Care Fraidy Mccarrick: Einar Pheasant Other Clinician: Referring Alenah Sarria: Referral, Self Treating Jadalyn Oliveri/Extender: Skipper Cliche in Treatment: 0 Active Inactive Wound/Skin Impairment Nursing Diagnoses: Knowledge deficit related to ulceration/compromised skin integrity Goals: Patient/caregiver will verbalize understanding of skin care regimen Date Initiated: 11/27/2021 Target Resolution Date: 12/28/2021 Goal Status: Active Ulcer/skin breakdown will have a volume reduction of 30% by week 4 Date Initiated: 11/27/2021 Target Resolution Date: 01/28/2022 Goal Status: Active Ulcer/skin breakdown will have a volume reduction of 50% by week 8 Date Initiated: 11/27/2021 Target Resolution Date: 02/25/2022 Goal Status: Active Ulcer/skin breakdown will have a volume reduction of 80% by week 12 Date Initiated: 11/27/2021 Target Resolution Date: 03/28/2022 Goal Status: Active Ulcer/skin breakdown will heal within 14 weeks Date Initiated: 11/27/2021 Target Resolution Date: 04/27/2022 Goal Status: Active Interventions: Assess patient/caregiver ability to obtain necessary supplies Assess patient/caregiver ability to perform ulcer/skin care regimen upon admission and as needed Assess ulceration(s) every visit Notes: Electronic Signature(s) Signed: 11/28/2021 10:41:07 AM By: Carlene Coria RN Entered By: Carlene Coria on 11/27/2021 14:07:13 Pine, Lahoma Rocker ([id number]) -------------------------------------------------------------------------------- Pain Assessment Details Patient Name: [name]. Date of Service: 11/27/2021 12:45 PM Medical Record Number: [id number] Patient Account Number: [id number] Date of Birth/Sex: Jan 04, 1971 (50 y.o. M) Treating RN: Carlene Coria Primary Care  Clancey Welton: Einar Pheasant Other Clinician: Referring Ayeshia Coppin: Referral, Self Treating Newel Oien/Extender: Skipper Cliche in Treatment: 0 Active Problems Location of Pain Severity and Description of Pain Patient Has Paino No Site Locations Pain Management and Medication Current Pain Management: Electronic Signature(s) Signed: 11/28/2021 10:41:07 AM By: Carlene Coria RN Entered By: Carlene Coria on 11/27/2021 13:10:39 Lamadrid, Lahoma Rocker ([id number]) -------------------------------------------------------------------------------- Patient/Caregiver Education Details Patient Name: [name]. Date of Service: 11/27/2021 12:45 PM Medical Record Number: [id number] Patient Account Number: [id number] Date of Birth/Gender: 09-Mar-1971 (50 y.o. M) Treating RN: Carlene Coria Primary Care Physician: Einar Pheasant Other Clinician: Referring Physician: Referral, Self Treating Physician/Extender: Skipper Cliche in Treatment: 0 Education Assessment Education Provided To: Patient Education Topics Provided Wound/Skin Impairment: Methods: Explain/Verbal Responses: State content correctly Electronic Signature(s) Signed: 11/28/2021 10:41:07 AM By: Carlene Coria RN Entered By: Carlene Coria on 11/27/2021 14:14:59 Wildwood, Lahoma Rocker ([id number]) -------------------------------------------------------------------------------- Wound Assessment Details Patient Name: [name]. Date of Service: 11/27/2021 12:45 PM Medical Record Number: [id number] Patient Account Number: [id number] Date of Birth/Sex: 26-Jul-1971 (50 y.o. M) Treating RN: Carlene Coria Primary Care Khloie Hamada: Einar Pheasant Other Clinician: Referring Daijanae Rafalski: Referral, Self Treating Savvas Roper/Extender: Joaquim Lai,  Hoyt Weeks in Treatment: 0 Wound Status Wound Number: 8 Primary Pressure Ulcer Etiology: Wound Location: Midline Sacrum Wound Status: Open Wounding Event: Gradually Appeared Comorbid Congestive Heart Failure, Type I  Diabetes, History of Date Acquired: 05/07/2021 History: pressure wounds, Paraplegia Weeks Of Treatment: 0 Clustered Wound: No Photos Wound Measurements Length: (cm) 3.5 Width: (cm) 2 Depth: (cm) 0.4 Area: (cm) 5.498 Volume: (cm) 2.199 % Reduction in Area: % Reduction in Volume: Epithelialization: None Tunneling: No Undermining: No Wound Description Classification: Category/Stage III Exudate Amount: Medium Exudate Type: Serosanguineous Exudate Color: red, brown Foul Odor After Cleansing: No Slough/Fibrino Yes Wound Bed Granulation Amount: Large (67-100%) Exposed Structure Granulation Quality: Red Fascia Exposed: No Necrotic Amount: Small (1-33%) Fat Layer (Subcutaneous Tissue) Exposed: Yes Necrotic Quality: Adherent Slough Tendon Exposed: No Muscle Exposed: No Joint Exposed: No Bone Exposed: No Treatment Notes Wound #8 (Sacrum) Wound Laterality: Midline Cleanser Byram Ancillary Kit - 15 Day Supply Discharge Instruction: Use supplies as instructed; Kit contains: (15) Saline Bullets; (15) 3x3 Gauze; 15 pr Gloves Soap and Water Discharge Instruction: Gently cleanse wound with antibacterial soap, rinse and pat dry prior to dressing wounds Frandsen, Taher K. ([id number]) Peri-Wound Care Topical Primary Dressing Silvercel 4 1/4x 4 1/4 (in/in) Discharge Instruction: Apply Silvercel 4 1/4x 4 1/4 (in/in) as instructed Secondary Dressing Zetuvit Plus Silicone Border Dressing 4x4 (in/in) Secured With Compression Wrap Compression Stockings Add-Ons Electronic Signature(s) Signed: 11/28/2021 10:41:07 AM By: Carlene Coria RN Entered By: Carlene Coria on 11/27/2021 13:27:31 Buhman, Lahoma Rocker ([id number]) -------------------------------------------------------------------------------- Wound Assessment Details Patient Name: [name]. Date of Service: 11/27/2021 12:45 PM Medical Record Number: [id number] Patient Account Number: [id number] Date of Birth/Sex: [birth year] (50  y.o. M) Treating RN: Carlene Coria Primary Care Dewel Lotter: Einar Pheasant Other Clinician: Referring Roan Miklos: Referral, Self Treating Patton Swisher/Extender: Skipper Cliche in Treatment: 0 Wound Status Wound Number: 9 Primary Pressure Ulcer Etiology: Wound Location: Right Sacrum Wound Status: Open Wounding Event: Gradually Appeared Comorbid Congestive Heart Failure, Type I Diabetes, History of Date Acquired: 05/07/2021 History: pressure wounds, Paraplegia Weeks Of Treatment: 0 Clustered Wound: No Photos Wound Measurements Length: (cm) 1.5 Width: (cm) 0.5 Depth: (cm) 0.3 Area: (cm) 0.589 Volume: (cm) 0.177 % Reduction in Area: % Reduction in Volume: Epithelialization: None Tunneling: No Undermining: No Wound Description Classification: Category/Stage III Exudate Amount: Medium Exudate Type: Serosanguineous Exudate Color: red, brown Foul Odor After Cleansing: No Slough/Fibrino Yes Wound Bed Granulation Amount: Large (67-100%) Exposed Structure Granulation Quality: Red Fascia Exposed: No Necrotic Amount: Small (1-33%) Fat Layer (Subcutaneous Tissue) Exposed: Yes Necrotic Quality: Adherent Slough Tendon Exposed: No Muscle Exposed: No Joint Exposed: No Bone Exposed: No Treatment Notes Wound #9 (Sacrum) Wound Laterality: Right Cleanser Byram Ancillary Kit - 15 Day Supply Discharge Instruction: Use supplies as instructed; Kit contains: (15) Saline Bullets; (15) 3x3 Gauze; 15 pr Gloves Soap and Water Discharge Instruction: Gently cleanse wound with antibacterial soap, rinse and pat dry prior to dressing wounds Swavely, Brittian K. ([id number]) Peri-Wound Care Topical Primary Dressing Silvercel 4 1/4x 4 1/4 (in/in) Discharge Instruction: Apply Silvercel 4 1/4x 4 1/4 (in/in) as instructed Secondary Dressing Zetuvit Plus Silicone Border Dressing 4x4 (in/in) Secured With Compression Wrap Compression Stockings Add-Ons Electronic Signature(s) Signed: 11/28/2021 10:41:07  AM By: Carlene Coria RN Entered By: Carlene Coria on 11/27/2021 13:29:06 Wingerter, Lahoma Rocker ([id number]) -------------------------------------------------------------------------------- Vitals Details Patient Name: [name]. Date of Service: 11/27/2021 12:45 PM Medical Record Number: [id number] Patient Account Number: [id number] Date of Birth/Sex: Apr 08, 1971 (50 y.o. M) Treating RN: Carlene Coria Primary Care  Idalia Allbritton: Einar Pheasant Other Clinician: Referring Michalina Calbert: Referral, Self Treating Arriyanna Mersch/Extender: Skipper Cliche in Treatment: 0 Vital Signs Time Taken: 13:10 Temperature (F): 97.5 Height (in): 72 Pulse (bpm): 59 Source: Stated Respiratory Rate (breaths/min): 18 Weight (lbs): 175 Blood Pressure (mmHg): 189/111 Source: Stated Reference Range: 80 - 120 mg / dl Body Mass Index (BMI): 23.7 Electronic Signature(s) Signed: 11/28/2021 10:41:07 AM By: Carlene Coria RN Entered By: Carlene Coria on 11/27/2021 14:16:26

## 2021-11-28 NOTE — Progress Notes (Signed)
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 prescribed last week; culture obtained last week grew Enterococcus faecalis sensitive to ampicillin and Acinobacter calcoaceticus/baumanii complex sensitive to ceftriaxone he will begin augmentin and omnicef ordered by my colleague. Plain film x-ray negative for any bony abnormality. We will transition to prisma. He is requesting later follow up due to financial concerns, he will follow up in two weeks 03/09/18; patient has completed his antibiotics. He states they made him nauseated and gave him 1 loose stool a day however he has completed Augmentin and Omnicef for enterococcus and Acinetobacter cultured his wound. X-ray did not show evidence of bony abnormalities. He has been using silver collagen the wound is better 03/16/18; the patient states that over the last several days he's had episodes of severe pain in the left heel. This can last for days at a time although later on he doesn't complain of any pain. He thinks the pain was about the same as when he had an infection. He's been using silver collagen and the wound  area has actually reduced 03/30/18; 2 week follow-up. He had his vascular studies done yesterday apparently there is no macrovascular disease per the patient however I don't have this official report. He's been using silver collagen wound size is slightly smaller 04/13/18; 2 week follow-up. He had his vascular studies done with no macrovascular disease. He's been using silver collagen. Arise with the wound certainly no small or maybe less depth. He is adamant that he is wearing open heeled shoes and that he offloads this at all times. He tells me today that he's had abdominal pain and melanotic colored stools ready states he's always had liquid/soft stools. This has not changed. This morning he had abdominal pain and felt like he might vomit. No fever no chills. He takes ibuprofen when necessary for pain. I've told him to stop this and he told me he already has 04/27/18; 2 week follow-up. He's been using Endoform for 2 weeks and certainly a better looking wound surface less depth and less surface area. Readmission: 11/27/2021 upon evaluation today patient appears to have a wound over the sacral region. He has been seen in the clinic many times previous. With that being said he tells me that this is currently been going on for about 6 months. He has been using intermittently different things he did do some Hydrofera Blue at one point he also has been doing silver alginate more recently  he tells me is draining quite a bit he is using a border foam dressing to cover. With that being said the biggest issue currently I believe in my opinion based on what I see is probably that there needs to be some more aggressive offloading. He tells me he has not really worry too much about where he was sitting for how long he was sitting due to the fact that this really was not a "pressure ulcer. With that being said I am concerned that this probably is a pressure ulcer based on what we are seeing here and I think how  much he sits or the lack thereof is probably can have a direct impact on how well he heals to be perfectly honest. I discussed that with him today. With that being said I do believe that the silver alginate is actually doing a pretty good job based on what I see currently and I discussed that with him as well. I am not certain that there is anything that is clinically better. We discussed the possibility of collagen although to be perfectly honest I am not certain that with the amount of drainage she has but that he can get a be beneficial for him to be perfectly honest. I really think the fact that he sit most of the day in his chair is probably the biggest culprit. Also I do not think he is can be a good candidate for a wound VAC due to how fragile and how much the skin folds and on himself at this location. Patient History Information obtained from Patient. Allergies Sulfa (Sulfonamide Antibiotics) (Reaction: tongue swells), Iodinated Contrast Media - IV Dye, Allerfed (pseudoephedrine) Family History Cancer - Father, Hypertension - Siblings,Paternal Grandparents,Father, No family history of Diabetes, Heart Disease, Kidney Disease, Lung Disease, Seizures, Stroke, Thyroid Problems, Tuberculosis. Social History Never smoker, Marital Status - Single, Alcohol Use - Moderate - beer, Drug Use - Prior History, Caffeine Use - Daily. Medical History Eyes Denies history of Cataracts, Glaucoma, Optic Neuritis Ear/Nose/Mouth/Throat Denies history of Chronic sinus problems/congestion, Middle ear problems Hematologic/Lymphatic Denies history of Anemia, Hemophilia, Human Immunodeficiency Virus, Lymphedema, Sickle Cell Disease Respiratory Denies history of Aspiration, Asthma, Chronic Obstructive Pulmonary Disease (COPD), Pneumothorax, Sleep Apnea, Tuberculosis Cardiovascular Patient has history of Congestive Heart Failure Denies history of Angina, Arrhythmia, Coronary Artery Disease, Deep Vein  Thrombosis, Hypertension, Hypotension, Myocardial Infarction, Peripheral Arterial Disease, Peripheral Venous Disease, Phlebitis, Vasculitis Gastrointestinal Denies history of Cirrhosis , Colitis, Crohn s, Hepatitis A, Hepatitis B, Hepatitis C Endocrine Patient has history of Type I Diabetes Genitourinary Denies history of End Stage Renal Disease Immunological Denies history of Lupus Erythematosus, Raynaud s, Scleroderma Integumentary (Skin) Patient has history of History of pressure wounds Denies history of History of Burn Musculoskeletal Denies history of Gout, Rheumatoid Arthritis, Osteoarthritis, Osteomyelitis Neurologic Patient has history of Paraplegia - chest down Denies history of Dementia, Neuropathy, Quadriplegia, Seizure Disorder Oncologic HARUN, BRUMLEY ([id number]) Denies history of Received Chemotherapy, Received Radiation Psychiatric Denies history of Anorexia/bulimia, Confinement Anxiety Medical And Surgical History Notes Constitutional Symptoms (General Health) paraplegia chest down; kidney stones, bladder surgery; Type I Diabetic Integumentary (Skin) paraplegia Review of Systems (ROS) Genitourinary Denies complaints or symptoms of Kidney failure/ Dialysis, Incontinence/dribbling. Immunological Denies complaints or symptoms of Hives, Itching. Integumentary (Skin) Complains or has symptoms of Wounds. Objective Constitutional patient is hypertensive.. pulse regular and within target range for patient.Marland Kitchen respirations regular, non-labored and within target range for patient.Marland Kitchen temperature within target range for patient.. Well-nourished and well-hydrated  in no acute distress. Vitals Time Taken: 1:10 PM, Height: 72 in, Source: Stated, Weight: 175 lbs, Source: Stated, BMI: 23.7, Temperature: 97.5 F, Pulse: 59 bpm, Respiratory Rate: 18 breaths/min, Blood Pressure: 189/111 mmHg. Eyes conjunctiva clear no eyelid edema noted. pupils equal round and reactive to light  and accommodation. Ears, Nose, Mouth, and Throat no gross abnormality of ear auricles or external auditory canals. normal hearing noted during conversation. mucus membranes moist. Respiratory normal breathing without difficulty. Cardiovascular 2+ dorsalis pedis/posterior tibialis pulses. no clubbing, cyanosis, significant edema, Musculoskeletal Patient unable to walk due to spinal cord injury. Psychiatric this patient is able to make decisions and demonstrates good insight into disease process. Alert and Oriented x 3. pleasant and cooperative. General Notes: Upon inspection patient's wound again showed signs of being fairly clean I do not see any evidence of active infection at this time the patient is extremely elevated as far as blood pressure is concerned. Nonetheless I do believe that based on what we see currently this wound is actually can require some more aggressive offloading to try to prevent it from continuing to remain open or break down any further. Actually believe the silver alginate is doing quite well for him. Integumentary (Hair, Skin) Wound #8 status is Open. Original cause of wound was Gradually Appeared. The date acquired was: 05/07/2021. The wound is located on the Midline Sacrum. The wound measures 3.5cm length x 2cm width x 0.4cm depth; 5.498cm^2 area and 2.199cm^3 volume. There is Fat Layer (Subcutaneous Tissue) exposed. There is no tunneling or undermining noted. There is a medium amount of serosanguineous drainage noted. There is large (67-100%) red granulation within the wound bed. There is a small (1-33%) amount of necrotic tissue within the wound bed including Adherent Slough. Wound #9 status is Open. Original cause of wound was Gradually Appeared. The date acquired was: 05/07/2021. The wound is located on the Right Sacrum. The wound measures 1.5cm length x 0.5cm width x 0.3cm depth; 0.589cm^2 area and 0.177cm^3 volume. There is Fat Layer (Subcutaneous Tissue)  exposed. There is no tunneling or undermining noted. There is a medium amount of serosanguineous drainage noted. There is large (67-100%) red granulation within the wound bed. There is a small (1-33%) amount of necrotic tissue within the wound bed including Adherent Slough. 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 There was a Chemical/Enzymatic/Mechanical debridement performed by Tommie Sams., PA-C. With the following instrument(s): saline and gauze. Other agent used was Saline and gauze. A time out was conducted at 14:08, prior to the start of the procedure. A Minimum amount of bleeding was controlled with N/A. The procedure was tolerated well with a pain level of 0 throughout and a pain level of 0 following the procedure. Post Debridement Measurements: 3.5cm length x 2cm width x 0.4cm depth; 2.199cm^3 volume. Post debridement Stage noted as Category/Stage III. Character of Wound/Ulcer Post Debridement is improved. Post procedure Diagnosis Wound #8: Same as Pre-Procedure Wound #9 Pre-procedure diagnosis of Wound #9 is a Pressure Ulcer located on the Right Sacrum . There was a Chemical/Enzymatic/Mechanical debridement performed by Tommie Sams., PA-C. With the following instrument(s): saline and gauze. Other agent used was Saline and gauze. A time out was conducted at 14:08, prior to the start  of the procedure. A Minimum amount of bleeding was controlled with N/A. The procedure was tolerated well with a pain level of 0 throughout and a pain level of 0 following the procedure. Post Debridement Measurements: 1.5cm length x 0.5cm width x 0.3cm depth; 0.177cm^3 volume. Post  debridement Stage noted as Category/Stage III. Character of Wound/Ulcer Post Debridement is improved. Post procedure Diagnosis Wound #9: Same as Pre-Procedure Plan Follow-up Appointments: Return Appointment in 3 weeks. Bathing/ Shower/ Hygiene: May shower; gently cleanse wound with antibacterial soap, rinse and pat dry prior to dressing wounds WOUND #8: - Sacrum Wound Laterality: Midline Cleanser: Byram Ancillary Kit - 15 Day Supply (DME) (Generic) 1 x Per Day/30 Days Discharge Instructions: Use supplies as instructed; Kit contains: (15) Saline Bullets; (15) 3x3 Gauze; 15 pr Gloves Cleanser: Soap and Water 1 x Per Day/30 Days Discharge Instructions: Gently cleanse wound with antibacterial soap, rinse and pat dry prior to dressing wounds Primary Dressing: Silvercel 4 1/4x 4 1/4 (in/in) (DME) (Generic) 1 x Per Day/30 Days Discharge Instructions: Apply Silvercel 4 1/4x 4 1/4 (in/in) as instructed Secondary Dressing: Zetuvit Plus Silicone Border Dressing 4x4 (in/in) (DME) (Generic) 1 x Per Day/30 Days WOUND #9: - Sacrum Wound Laterality: Right Cleanser: Byram Ancillary Kit - 15 Day Supply (DME) (Generic) 1 x Per Day/30 Days Discharge Instructions: Use supplies as instructed; Kit contains: (15) Saline Bullets; (15) 3x3 Gauze; 15 pr Gloves Cleanser: Soap and Water 1 x Per Day/30 Days Discharge Instructions: Gently cleanse wound with antibacterial soap, rinse and pat dry prior to dressing wounds Primary Dressing: Silvercel 4 1/4x 4 1/4 (in/in) (DME) (Generic) 1 x Per Day/30 Days Discharge Instructions: Apply Silvercel 4 1/4x 4 1/4 (in/in) as instructed Secondary Dressing: Zetuvit Plus Silicone Border Dressing 4x4 (in/in) (DME) (Generic) 1 x Per Day/30 Days 1. I would recommend currently that we actually continue with the silver alginate dressing as well as the border foam dressing I see nothing wrong with that treatment plan. 2. I do think the patient should initiate more aggressive offloading I  discussed that with him today. With that being said I think that he should every 2 hours at least change positions getting out of his chair into the bed laying on his side keep pressure off and so on if he is in the bed for 2 hours or so he can get in the chair to get a different pressure offloading regimen. Again I think doing this every 2 hours at least changing positions is going to be what he needs to try to get this to heal. We will see patient back for reevaluation in 3 weeks here in the clinic. If anything worsens or changes patient will contact our office for additional recommendations. [name], [name] ([id number]) Electronic Signature(s) Signed: 11/27/2021 6:19:20 PM By: Worthy Keeler PA-C Entered By: Worthy Keeler on 11/27/2021 18:19:19 [name], [name] ([id number]) -------------------------------------------------------------------------------- ROS/PFSH Details Patient Name: [name] Date of Service: 11/27/2021 12:45 PM Medical Record Number: [id number] Patient Account Number: [id number] Date of Birth/Sex: 05/12/1971 (50 y.o. M) Treating RN: Carlene Coria Primary Care Provider: Einar Pheasant Other Clinician: Referring Provider: Referral, Self Treating Provider/Extender: Skipper Cliche in Treatment: 0 Information Obtained From Patient Genitourinary Complaints and Symptoms: Negative for: Kidney failure/ Dialysis; Incontinence/dribbling Medical History: Negative for: End Stage Renal Disease Immunological Complaints and Symptoms: Negative for: Hives; Itching Medical History: Negative for: Lupus Erythematosus; Raynaudos; Scleroderma Integumentary (Skin) Complaints and Symptoms: Positive for: Wounds Medical History: Positive for: History of pressure wounds Negative  for: History of Burn Past Medical History Notes: paraplegia Constitutional Symptoms (General Health) Medical History: Past Medical History Notes: paraplegia chest down; kidney stones,  bladder surgery; Type I Diabetic Eyes Medical History: Negative for: Cataracts; Glaucoma; Optic Neuritis Ear/Nose/Mouth/Throat Medical History: Negative for: Chronic sinus problems/congestion; Middle ear problems Hematologic/Lymphatic Medical History: Negative for: Anemia; Hemophilia; Human Immunodeficiency Virus; Lymphedema; Sickle Cell Disease Respiratory Medical History: Negative for: Aspiration; Asthma; Chronic Obstructive Pulmonary Disease (COPD); Pneumothorax; Sleep Apnea; Tuberculosis Cardiovascular Medical History: Positive for: Congestive Heart Failure RAYNOR, CALCATERRA. ([id number]) Negative for: Angina; Arrhythmia; Coronary Artery Disease; Deep Vein Thrombosis; Hypertension; Hypotension; Myocardial Infarction; Peripheral Arterial Disease; Peripheral Venous Disease; Phlebitis; Vasculitis Gastrointestinal Medical History: Negative for: Cirrhosis ; Colitis; Crohnos; Hepatitis A; Hepatitis B; Hepatitis C Endocrine Medical History: Positive for: Type I Diabetes Time with diabetes: 25 Treated with: Insulin Blood sugar tested every day: Yes Tested : 6 times Blood sugar testing results: Breakfast: 143 Musculoskeletal Medical History: Negative for: Gout; Rheumatoid Arthritis; Osteoarthritis; Osteomyelitis Neurologic Medical History: Positive for: Paraplegia - chest down Negative for: Dementia; Neuropathy; Quadriplegia; Seizure Disorder Oncologic Medical History: Negative for: Received Chemotherapy; Received Radiation Psychiatric Medical History: Negative for: Anorexia/bulimia; Confinement Anxiety Immunizations Pneumococcal Vaccine: Received Pneumococcal Vaccination: No Implantable Devices None Family and Social History Cancer: Yes - Father; Diabetes: No; Heart Disease: No; Hypertension: Yes - Siblings,Paternal Grandparents,Father; Kidney Disease: No; Lung Disease: No; Seizures: No; Stroke: No; Thyroid Problems: No; Tuberculosis: No; Never smoker; Marital Status -  Single; Alcohol Use: Moderate - beer; Drug Use: Prior History; Caffeine Use: Daily; Financial Concerns: No; Food, Clothing or Shelter Needs: No; Support System Lacking: No; Transportation Concerns: No Electronic Signature(s) Signed: 11/27/2021 6:19:38 PM By: Worthy Keeler PA-C Signed: 11/28/2021 10:41:07 AM By: Carlene Coria RN Entered By: Carlene Coria on 11/27/2021 13:15:46 Kimmer, [name] ([id number]) -------------------------------------------------------------------------------- SuperBill Details Patient Name: [name]. Date of Service: 11/27/2021 Medical Record Number: [id number] Patient Account Number: [id number] Date of Birth/Sex: 16-Jan-1971 (50 y.o. 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 I50.42 Chronic combined systolic (congestive) and diastolic (congestive) heart failure Facility Procedures CPT4 Code: [id number] Description: 99214 - WOUND CARE VISIT-LEV 4 EST PT Modifier: Quantity: 1 Physician Procedures CPT4 Code: 6073710 Description: WC PHYS LEVEL 3 o [name] PT Modifier: Quantity: 1 CPT4 Code: Description: ICD-10 Diagnosis Description L89.153 Pressure ulcer of sacral region, stage 3 G82.20 Paraplegia, unspecified E10.621 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

## 2021-11-28 NOTE — Progress Notes (Signed)
DACOTA, RUBEN ([id number]) Visit Report for 11/27/2021 Abuse/Suicide Risk Screen Details Patient Name: [name], [name]. Date of Service: 11/27/2021 12:45 PM Medical Record Number: [id number] Patient Account Number: [id number] Date of Birth/Sex: 23-Apr-1971 (50 y.o. M) Treating RN: Carlene Coria Primary Care Zaccai Chavarin: Einar Pheasant Other Clinician: Referring Alice Burnside: Referral, Self Treating Arrington Bencomo/Extender: Skipper Cliche in Treatment: 0 Abuse/Suicide Risk Screen Items Answer ABUSE RISK SCREEN: Has anyone close to you tried to hurt or harm you recentlyo No Do you feel uncomfortable with anyone in your familyo No Has anyone forced you do things that you didnot want to doo No Electronic Signature(s) Signed: 11/28/2021 10:41:07 AM By: Carlene Coria RN Entered By: Carlene Coria on 11/27/2021 13:15:55 Hayden, Lahoma Rocker ([id number]) -------------------------------------------------------------------------------- Activities of Daily Living Details Patient Name: [name]. Date of Service: 11/27/2021 12:45 PM Medical Record Number: [id number] Patient Account Number: [id number] Date of Birth/Sex: 11/29/1971 (50 y.o. M) Treating RN: Carlene Coria Primary Care Mathea Frieling: Einar Pheasant Other Clinician: Referring Austen Oyster: Referral, Self Treating Ell Tiso/Extender: Skipper Cliche in Treatment: 0 Activities of Daily Living Items Answer Activities of Daily Living (Please select one for each item) Drive Automobile Completely Able Take Medications Completely Able Use Telephone Completely Able Care for Appearance Completely Able Use Toilet Completely Able Bath / Shower Completely Able Dress Self Completely Able Feed Self Completely Able Walk Not Able Get In / Out Bed Completely Able Housework Completely Able Prepare Meals Completely [place] for Self Completely Able Electronic Signature(s) Signed: 11/28/2021 10:41:07 AM By: Carlene Coria  RN Entered By: Carlene Coria on 11/27/2021 13:16:28 Marquina, Lahoma Rocker ([id number]) -------------------------------------------------------------------------------- Education Screening Details Patient Name: [name]. Date of Service: 11/27/2021 12:45 PM Medical Record Number: [id number] Patient Account Number: [id number] Date of Birth/Sex: 05-09-1971 (50 y.o. M) Treating RN: Carlene Coria Primary Care Yisrael Obryan: Einar Pheasant Other Clinician: Referring Jessie Schrieber: Referral, Self Treating Deverick Pruss/Extender: Skipper Cliche in Treatment: 0 Primary Learner Assessed: Patient Learning Preferences/Education Level/Primary Language Learning Preference: Explanation Highest Education Level: College or Above Preferred Language: English Cognitive Barrier Language Barrier: No Translator Needed: No Memory Deficit: No Emotional Barrier: No Cultural/Religious Beliefs Affecting Medical Care: No Physical Barrier Impaired Vision: No Impaired Hearing: No Decreased Hand dexterity: No Knowledge/Comprehension Knowledge Level: Medium Comprehension Level: High Ability to understand written instructions: High Ability to understand verbal instructions: High Motivation Anxiety Level: Anxious Cooperation: Cooperative Education Importance: Acknowledges Need Interest in Health Problems: Asks Questions Perception: Coherent Willingness to Engage in Self-Management High Activities: Readiness to Engage in Self-Management High Activities: Electronic Signature(s) Signed: 11/28/2021 10:41:07 AM By: Carlene Coria RN Entered By: Carlene Coria on 11/27/2021 13:16:58 Odonovan, Lahoma Rocker ([id number]) -------------------------------------------------------------------------------- Fall Risk Assessment Details Patient Name: [name]. Date of Service: 11/27/2021 12:45 PM Medical Record Number: [id number] Patient Account Number: [id number] Date of Birth/Sex: 06/12/1971 (50 y.o. M) Treating RN: Carlene Coria Primary Care Ixchel Duck: Einar Pheasant Other Clinician: Referring Rylynne Schicker: Referral, Self Treating Arminta Gamm/Extender: Skipper Cliche in Treatment: 0 Fall Risk Assessment Items Have you had 2 or more falls in the last 12 monthso 0 No Have you had any fall that resulted in injury in the last 12 monthso 0 No FALLS RISK SCREEN History of falling - immediate or within 3 months 0 No Secondary diagnosis (Do you have 2 or more medical diagnoseso) 0 No Ambulatory aid None/bed rest/wheelchair/nurse 0 No Crutches/cane/walker 0 No Furniture 0 No Intravenous therapy Access/Saline/Heparin Lock 0 No Gait/Transferring Normal/ bed rest/ wheelchair 0 No Weak (short steps with  or without shuffle, stooped but able to lift head while walking, may 0 No seek support from furniture) Impaired (short steps with shuffle, may have difficulty arising from chair, head down, impaired 0 No balance) Mental Status Oriented to own ability 0 No Electronic Signature(s) Signed: 11/28/2021 10:41:07 AM By: Carlene Coria RN Entered By: Carlene Coria on 11/27/2021 13:17:06 Layson, Lahoma Rocker ([id number]) -------------------------------------------------------------------------------- Foot Assessment Details Patient Name: [name]. Date of Service: 11/27/2021 12:45 PM Medical Record Number: [id number] Patient Account Number: [id number] Date of Birth/Sex: 1971-11-08 (50 y.o. M) Treating RN: Carlene Coria Primary Care Nesta Scaturro: Einar Pheasant Other Clinician: Referring Asenath Balash: Referral, Self Treating Neeva Trew/Extender: Skipper Cliche in Treatment: 0 Foot Assessment Items Site Locations + = Sensation present, - = Sensation absent, C = Callus, U = Ulcer R = Redness, W = Warmth, M = Maceration, PU = Pre-ulcerative lesion F = Fissure, S = Swelling, D = Dryness Assessment Right: Left: Other Deformity: No No Prior Foot Ulcer: No No Prior Amputation: No No Charcot Joint: No No Ambulatory Status:  Non-ambulatory Assistance Device: Wheelchair Gait: Administrator, arts) Signed: 11/28/2021 10:41:07 AM By: Carlene Coria RN Entered By: Carlene Coria on 11/27/2021 13:17:35 Townsend, Lahoma Rocker ([id number]) -------------------------------------------------------------------------------- Nutrition Risk Screening Details Patient Name: [name]. Date of Service: 11/27/2021 12:45 PM Medical Record Number: [id number] Patient Account Number: [id number] Date of Birth/Sex: [birth year] (50 y.o. M) Treating RN: Carlene Coria Primary Care Kampbell Holaway: Einar Pheasant Other Clinician: Referring Abrea Henle: Referral, Self Treating Buck Mcaffee/Extender: Skipper Cliche in Treatment: 0 Height (in): 72 Weight (lbs): 175 Body Mass Index (BMI): 23.7 Nutrition Risk Screening Items Score Screening NUTRITION RISK SCREEN: I have an illness or condition that made me change the kind and/or amount of food I eat 0 No I eat fewer than two meals per day 0 No I eat few fruits and vegetables, or milk products 0 No I have three or more drinks of beer, liquor or wine almost every day 0 No I have tooth or mouth problems that make it hard for me to eat 0 No I don't always have enough money to buy the food I need 0 No I eat alone most of the time 0 No I take three or more different prescribed or over-the-counter drugs a day 1 Yes Without wanting to, I have lost or gained 10 pounds in the last six months 0 No I am not always physically able to shop, cook and/or feed myself 0 No Nutrition Protocols Good Risk Protocol 0 No interventions needed Moderate Risk Protocol High Risk Proctocol Risk Level: Good Risk Score: 1 Electronic Signature(s) Signed: 11/28/2021 10:41:07 AM By: Carlene Coria RN Entered By: Carlene Coria on 11/27/2021 13:17:20

## 2021-12-03 DIAGNOSIS — Z794 Long term (current) use of insulin: Secondary | ICD-10-CM | POA: Diagnosis not present

## 2021-12-03 DIAGNOSIS — E1140 Type 2 diabetes mellitus with diabetic neuropathy, unspecified: Secondary | ICD-10-CM | POA: Diagnosis not present

## 2021-12-03 DIAGNOSIS — L97521 Non-pressure chronic ulcer of other part of left foot limited to breakdown of skin: Secondary | ICD-10-CM | POA: Diagnosis not present

## 2021-12-17 DIAGNOSIS — E109 Type 1 diabetes mellitus without complications: Secondary | ICD-10-CM | POA: Diagnosis not present

## 2021-12-17 DIAGNOSIS — Z794 Long term (current) use of insulin: Secondary | ICD-10-CM | POA: Diagnosis not present

## 2021-12-18 ENCOUNTER — Encounter: Payer: Medicare Other | Attending: Physician Assistant | Admitting: Physician Assistant

## 2021-12-18 ENCOUNTER — Other Ambulatory Visit: Payer: Self-pay

## 2021-12-18 DIAGNOSIS — G8254 Quadriplegia, C5-C7 incomplete: Secondary | ICD-10-CM | POA: Insufficient documentation

## 2021-12-18 DIAGNOSIS — I110 Hypertensive heart disease with heart failure: Secondary | ICD-10-CM | POA: Diagnosis not present

## 2021-12-18 DIAGNOSIS — Z981 Arthrodesis status: Secondary | ICD-10-CM | POA: Insufficient documentation

## 2021-12-18 DIAGNOSIS — L89153 Pressure ulcer of sacral region, stage 3: Secondary | ICD-10-CM | POA: Diagnosis not present

## 2021-12-18 DIAGNOSIS — E109 Type 1 diabetes mellitus without complications: Secondary | ICD-10-CM | POA: Diagnosis not present

## 2021-12-18 DIAGNOSIS — I5042 Chronic combined systolic (congestive) and diastolic (congestive) heart failure: Secondary | ICD-10-CM | POA: Insufficient documentation

## 2021-12-18 DIAGNOSIS — E10621 Type 1 diabetes mellitus with foot ulcer: Secondary | ICD-10-CM | POA: Diagnosis not present

## 2021-12-18 DIAGNOSIS — G8220 Paraplegia, unspecified: Secondary | ICD-10-CM | POA: Diagnosis not present

## 2021-12-18 NOTE — Progress Notes (Addendum)
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 With that being said I am concerned that this probably is a pressure ulcer based on what we °are seeing here and I think how much he sits or the lack thereof is probably can have a direct impact on how well he heals to be perfectly honest. °I discussed that with him today. With that being said I do believe that the silver alginate is actually doing a pretty good job based on what I see °currently and I discussed that with him as well. I am not certain that there is anything that is clinically better. We discussed the possibility of °collagen although to be perfectly honest I am not certain that with the amount of drainage she has but that he can get a be beneficial for him to °be perfectly honest. I really think the fact that he sit most of the day in his chair is probably the biggest culprit. Also I do not think he is can be a °good candidate for a wound VAC due to how fragile and how much the skin folds and on himself at this location. °12/18/2021 upon evaluation today patient's wound is actually showing some signs of improvement noted currently. Fortunately there does not °appear to be any evidence of active infection locally nor systemically at this time which is great news. He may have a little bit of epithelial growth °as well which is also great news. Fortunately I do  not see any signs of active infection which is great news. Overall I think that he is making good °progress considering the wound and where it is at. °Objective °Constitutional °Well-nourished and well-hydrated in no acute distress. °Vitals Time Taken: 12:42 PM, Height: 72   in, Weight: 175 lbs, BMI: 23.7, Temperature: 97.5 F, Pulse: 56 bpm, Respiratory Rate: 18 breaths/min, Blood Pressure: 167/84 mmHg. Respiratory normal breathing without difficulty. Psychiatric this patient is able to make decisions and demonstrates good insight into disease process. Alert and Oriented x 3. pleasant and cooperative. General Notes: Upon inspection patient's wound bed actually showed signs of not being quite as wet as it was last time he was here. Overall I do think that he is making some progress which is great news currently. This is good to be very slow and I did explain this as well today we discussed this previous and I did tell him that he is to try as much as possible to keep pressure off which I think is still good to be of utmost importance. Even though this is not at heart a pressure injury I think still the pressure relief is good to be necessary. Integumentary (Hair, Skin) Wound #8 status is Open. Original cause of wound was Gradually Appeared. The date acquired was: 05/07/2021. The wound has been in treatment 3 weeks. The wound is located on the Midline Sacrum. The wound measures 3.5cm length x 2cm width x 0.4cm depth; 5.498cm^2 area and 2.199cm^3 volume. There is a medium amount of serosanguineous drainage noted. Wound #9 status is Open. Original cause of wound was Gradually Appeared. The date acquired was: 05/07/2021. The wound has been in treatment 3 weeks. The wound is located on the Right Sacrum. The wound measures 1cm length x 0.5cm width x 0.3cm depth; 0.393cm^2 area and 0.118cm^3 volume. There is Fat Layer (Subcutaneous Tissue) exposed. There is no tunneling or undermining noted. There is a medium  amount of serosanguineous drainage noted. There is large (67-100%) red granulation within the wound bed. There is a small (1-33%) amount of necrotic [name], [name] ([id number]) tissue within the wound bed including Adherent Slough. 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 Plan Follow-up Appointments: Return Appointment in 1 month Bathing/ Shower/ Hygiene: May shower; gently cleanse wound with antibacterial soap, rinse and pat dry prior to dressing wounds WOUND #8: - Sacrum Wound Laterality: Midline Cleanser: Byram Ancillary Kit - 15 Day Supply (Generic) 1 x Per Day/30 Days Discharge Instructions: Use supplies as instructed; Kit contains: (15) Saline Bullets; (15) 3x3 Gauze; 15 pr Gloves Cleanser: Soap and Water 1 x Per Day/30 Days Discharge Instructions: Gently cleanse wound with antibacterial soap, rinse and pat dry prior to dressing wounds Primary Dressing: Silvercel 4 1/4x 4 1/4 (in/in) (Generic) 1 x Per Day/30 Days Discharge Instructions: Apply Silvercel 4 1/4x 4 1/4 (in/in) as instructed Secondary Dressing: Zetuvit Plus Silicone Border Dressing 4x4 (in/in) (Generic) 1 x Per Day/30 Days WOUND #9: - Sacrum Wound Laterality: Right Cleanser: Byram Ancillary Kit - 15 Day Supply (Generic) 1 x Per Day/30 Days Discharge Instructions: Use supplies as instructed; Kit contains: (15) Saline Bullets; (15) 3x3 Gauze; 15 pr Gloves Cleanser: Soap and Water 1 x Per Day/30 Days Discharge Instructions: Gently cleanse wound with antibacterial soap, rinse and pat dry prior to dressing wounds Primary Dressing: Silvercel 4 1/4x 4 1/4 (in/in) (Generic) 1 x Per Day/30 Days Discharge Instructions: Apply Silvercel 4 1/4x 4 1/4 (in/in) as instructed Secondary Dressing: Zetuvit Plus Silicone Border Dressing 4x4 (in/in) (Generic)  1 x Per Day/30 Days 1. Would recommend currently that going continue with the wound care measures as before and this includes  the use of the silver alginate dressing which I think is doing a pretty good job he is taking care of this at home. 2. I am also can recommend that we continue with a Zetuvit border foam dressing to cover. 3. I am also can recommend that we have the patient continue to offload appropriately which we discussed in detail today as well. He tells me that he will continue to try to do so. We will see patient back for reevaluation in 1 Month here in the clinic. If anything worsens or changes patient will contact our office for additional recommendations. Electronic Signature(s) Signed: 12/18/2021 1:09:50 PM By: Worthy Keeler PA-C Entered By: Worthy Keeler on 12/18/2021 13:09:49 [name], [name] ([id number]) -------------------------------------------------------------------------------- SuperBill Details Patient Name: [name]. Date of Service: 12/18/2021 Medical Record Number: [id number] Patient Account Number: [id number] Date of Birth/Sex: [birth year] (51 y.o. 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 I50.42 Chronic combined systolic (congestive) and diastolic (congestive) heart failure Facility Procedures CPT4 Code: [id number] Description: 99213 - WOUND CARE VISIT-LEV 3 EST PT Modifier: Quantity: 1 Physician Procedures CPT4 Code: [id number] Description: 99213 - WC PHYS LEVEL 3 - EST PT Modifier: Quantity: 1 CPT4 Code: Description: ICD-10 Diagnosis Description L89.153 Pressure ulcer of sacral region, stage 3 G82.20 Paraplegia, unspecified  E10.621 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

## 2021-12-19 NOTE — Progress Notes (Signed)
HAKIEM, MALIZIA ([id number]) Visit Report for 12/18/2021 Arrival Information Details Patient Name: [name], [name]. Date of Service: 12/18/2021 12:30 PM Medical Record Number: [id number] Patient Account Number: [id number] Date of Birth/Sex: [birth year] (51 y.o. M) Treating RN: Carlene Coria Primary Care Ledarius Leeson: Einar Pheasant Other Clinician: Referring Christain Niznik: Einar Pheasant Treating Kaesen Rodriguez/Extender: Skipper Cliche in Treatment: 3 Visit Information History Since Last Visit All ordered tests and consults were completed: No Patient Arrived: Wheel Chair Added or deleted any medications: No Arrival Time: 12:35 Any new allergies or adverse reactions: No Accompanied By: self Had a fall or experienced change in No Transfer Assistance: None activities of daily living that may affect Patient Identification Verified: Yes risk of falls: Secondary Verification Process Completed: Yes Signs or symptoms of abuse/neglect since last visito No Patient Requires Transmission-Based Precautions: No Hospitalized since last visit: No Patient Has Alerts: No Implantable device outside of the clinic excluding No cellular tissue based products placed in the center since last visit: Has Dressing in Place as Prescribed: Yes Pain Present Now: No Electronic Signature(s) Signed: 12/19/2021 4:26:12 PM By: Carlene Coria RN Entered By: Carlene Coria on 12/18/2021 12:42:37 [place], Lahoma Rocker ([id number]) -------------------------------------------------------------------------------- Clinic Level of Care Assessment Details Patient Name: [name]. Date of Service: 12/18/2021 12:30 PM Medical Record Number: [id number] Patient Account Number: [id number] Date of Birth/Sex: 1971/01/20 (51 y.o. M) Treating RN: Carlene Coria Primary Care Damariz Paganelli: Einar Pheasant Other Clinician: Referring Marilee Ditommaso: Einar Pheasant Treating Ileanna Gemmill/Extender: Skipper Cliche in Treatment: 3 Clinic Level of Care  Assessment Items TOOL 4 Quantity Score X - Use when only an EandM is performed on FOLLOW-UP visit 1 0 ASSESSMENTS - Nursing Assessment / Reassessment _0  - Reassessment of Co-morbidities (includes updates in patient status) 0 _1  - 0 Reassessment of Adherence to Treatment Plan ASSESSMENTS - Wound and Skin Assessment / Reassessment _2  - Simple Wound Assessment / Reassessment - one wound 0 X- 2 5 Complex Wound Assessment / Reassessment - multiple wounds _3  - 0 Dermatologic / Skin Assessment (not related to wound area) ASSESSMENTS - Focused Assessment _4  - Circumferential Edema Measurements - multi extremities 0 _5  - 0 Nutritional Assessment / Counseling / Intervention _6  - 0 Lower Extremity Assessment (monofilament, tuning fork, pulses) _7  - 0 Peripheral Arterial Disease Assessment (using hand held doppler) ASSESSMENTS - Ostomy and/or Continence Assessment and Care _8  - Incontinence Assessment and Management 0 _9  - 0 Ostomy Care Assessment and Management (repouching, etc.) PROCESS - Coordination of Care X - Simple Patient / Family Education for ongoing care 1 15 _10  - 0 Complex (extensive) Patient / Family Education for ongoing care _11  - 0 Staff obtains Programmer, systems, Records, Test Results / Process Orders _12  - 0 Staff telephones HHA, Nursing Homes / Clarify orders / etc _13  - 0 Routine Transfer to another Facility (non-emergent condition) _14  - 0 Routine Hospital Admission (non-emergent condition) _15  - 0 New Admissions / Biomedical engineer / Ordering NPWT, Apligraf, etc. _16  - 0 Emergency Hospital Admission (emergent condition) X- 1 10 Simple Discharge Coordination _17  - 0 Complex (extensive) Discharge Coordination PROCESS - Special Needs _18  - Pediatric / Minor Patient Management 0 _19  - 0 Isolation Patient Management _20  - 0 Hearing / Language / Visual special needs _21  - 0 Assessment of Community assistance (transportation, D/C planning, etc.) _22  - 0 Additional  assistance / Altered mentation _23  - 0 Support Surface(s) Assessment (bed, cushion, seat, etc.) INTERVENTIONS - Wound Cleansing / Measurement Reiber, Jsaon K. ([id number]) _24  - 0 Simple Wound Cleansing - one  wound X- 2 5 Complex Wound Cleansing - multiple wounds _0  - 0 Wound Imaging (photographs - any number of wounds) X- 1 5 Wound Tracing (instead of photographs) _1  - 0 Simple Wound Measurement - one wound X- 2 5 Complex Wound Measurement - multiple wounds INTERVENTIONS - Wound Dressings X - Small Wound Dressing one or multiple wounds 2 10 _2  - 0 Medium Wound Dressing one or multiple wounds _3  - 0 Large Wound Dressing one or multiple wounds <OEUMPNTIRWERXVQM>_0<\/QQPYPPJKDTOIZTIW>_5  - 0 Application of Medications - topical <YKDXIPJASNKNLZJQ>_7<\/HALPFXTKWIOXBDZH>_2  - 0 Application of Medications - injection INTERVENTIONS - Miscellaneous _6  - External ear exam 0 _7  - 0 Specimen Collection (cultures, biopsies, blood, body fluids, etc.) _8  - 0 Specimen(s) / Culture(s) sent or taken to Lab for analysis _9  - 0 Patient Transfer (multiple staff / Civil Service fast streamer / Similar devices) _10  - 0 Simple Staple / Suture removal (25 or less) _11  - 0 Complex Staple / Suture removal (26 or more) _12  - 0 Hypo / Hyperglycemic Management (close monitor of Blood Glucose) _13  - 0 Ankle / Brachial Index (ABI) - do not check if billed separately X- 1 5 Vital Signs Has the patient been seen at the hospital within the last three years: Yes Total Score: 85 Level Of Care: New/Established - Level 3 Electronic Signature(s) Signed: 12/19/2021 4:26:12 PM By: Carlene Coria RN Entered By: Carlene Coria on 12/18/2021 13:00:13 [place], Lahoma Rocker ([id number]) -------------------------------------------------------------------------------- Encounter Discharge Information Details Patient Name: [name]. Date of Service: 12/18/2021 12:30 PM Medical Record Number: [id number] Patient Account Number: [id number] Date of Birth/Sex: 07-20-1971 (51 y.o. M) Treating RN: Carlene Coria Primary  Care Child Campoy: Einar Pheasant Other Clinician: Referring Steadman Prosperi: Einar Pheasant Treating Joshawa Dubin/Extender: Skipper Cliche in Treatment: 3 Encounter Discharge Information Items Discharge Condition: Stable Ambulatory Status: Wheelchair Discharge Destination: Home Transportation: Private Auto Accompanied By: self Schedule Follow-up Appointment: Yes Clinical Summary of Care: Patient Declined Electronic Signature(s) Signed: 12/19/2021 4:26:12 PM By: Carlene Coria RN Entered By: Carlene Coria on 12/18/2021 13:01:10 [place], Lahoma Rocker ([id number]) -------------------------------------------------------------------------------- Lower Extremity Assessment Details Patient Name: [name]. Date of Service: 12/18/2021 12:30 PM Medical Record Number: [id number] Patient Account Number: [id number] Date of Birth/Sex: 22-Jul-1971 (51 y.o. M) Treating RN: Carlene Coria Primary Care Ouita Nish: Einar Pheasant Other Clinician: Referring Emerson Schreifels: Einar Pheasant Treating Raisha Brabender/Extender: Jeri Cos Weeks in Treatment: 3 Electronic Signature(s) Signed: 12/19/2021 4:26:12 PM By: Carlene Coria RN Entered By: Carlene Coria on 12/18/2021 12:51:52 [place], Lahoma Rocker ([id number]) -------------------------------------------------------------------------------- Multi Wound Chart Details Patient Name: [name]. Date of Service: 12/18/2021 12:30 PM Medical Record Number: [id number] Patient Account Number: [id number] Date of Birth/Sex: 1971/06/03 (50 y.o. M) Treating RN: Carlene Coria Primary Care Shamiah Kahler: Einar Pheasant Other Clinician: Referring Ethon Wymer: Einar Pheasant Treating Markail Diekman/Extender: Skipper Cliche in Treatment: 3 Vital Signs Height(in): 72 Pulse(bpm): 74 Weight(lbs): 175 Blood Pressure(mmHg): 167/84 Body Mass Index(BMI): 24 Temperature(F): 97.5 Respiratory Rate(breaths/min): 18 Photos: [9:No Photos] [N/A:N/A] Wound Location: Midline Sacrum Right Sacrum N/A Wounding  Event: Gradually Appeared Gradually Appeared N/A Primary Etiology: Pressure Ulcer Pressure Ulcer N/A Comorbid History: Congestive Heart Failure, Type I Congestive Heart Failure, Type I N/A Diabetes, History of pressure Diabetes, History of pressure wounds, Paraplegia wounds, Paraplegia Date Acquired: 05/07/2021 05/07/2021 N/A Weeks of Treatment: 3 3 N/A Wound Status: Open Open N/A Measurements L x W x D (cm) 3.5x2x0.4 1x0.5x0.3 N/A Area (cm) : 5.498 0.393 N/A Volume (cm) : 2.199 0.118 N/A % Reduction in Area: 0.00% 33.30% N/A % Reduction in Volume: 0.00% 33.30% N/A  Classification: Category/Stage III Category/Stage III N/A Exudate Amount: Medium Medium N/A Exudate Type: Serosanguineous Serosanguineous N/A Exudate Color: red, brown red, brown N/A Granulation Amount: N/A Large (67-100%) N/A Granulation Quality: N/A Red N/A Necrotic Amount: N/A Small (1-33%) N/A Epithelialization: N/A None N/A Treatment Notes Electronic Signature(s) Signed: 12/19/2021 4:26:12 PM By: Carlene Coria RN Entered By: Carlene Coria on 12/18/2021 12:56:52 [place], Lahoma Rocker ([id number]) -------------------------------------------------------------------------------- [place] Details Patient Name: [name]. Date of Service: 12/18/2021 12:30 PM Medical Record Number: [id number] Patient Account Number: [id number] Date of Birth/Sex: [birth year] (51 y.o. M) Treating RN: Carlene Coria Primary Care Mahek Schlesinger: Einar Pheasant Other Clinician: Referring Sampson Self: Einar Pheasant Treating Gavin Faivre/Extender: Skipper Cliche in Treatment: 3 Active Inactive Wound/Skin Impairment Nursing Diagnoses: Knowledge deficit related to ulceration/compromised skin integrity Goals: Patient/caregiver will verbalize understanding of skin care regimen Date Initiated: 11/27/2021 Target Resolution Date: 12/28/2021 Goal Status: Active Ulcer/skin breakdown will have a volume reduction of 30% by week 4 Date  Initiated: 11/27/2021 Target Resolution Date: 01/28/2022 Goal Status: Active Ulcer/skin breakdown will have a volume reduction of 50% by week 8 Date Initiated: 11/27/2021 Target Resolution Date: 02/25/2022 Goal Status: Active Ulcer/skin breakdown will have a volume reduction of 80% by week 12 Date Initiated: 11/27/2021 Target Resolution Date: 03/28/2022 Goal Status: Active Ulcer/skin breakdown will heal within 14 weeks Date Initiated: 11/27/2021 Target Resolution Date: 04/27/2022 Goal Status: Active Interventions: Assess patient/caregiver ability to obtain necessary supplies Assess patient/caregiver ability to perform ulcer/skin care regimen upon admission and as needed Assess ulceration(s) every visit Notes: Electronic Signature(s) Signed: 12/19/2021 4:26:12 PM By: Carlene Coria RN Entered By: Carlene Coria on 12/18/2021 12:56:43 [place], Lahoma Rocker ([id number]) -------------------------------------------------------------------------------- Pain Assessment Details Patient Name: [name]. Date of Service: 12/18/2021 12:30 PM Medical Record Number: [id number] Patient Account Number: [id number] Date of Birth/Sex: [birth year] (51 y.o. M) Treating RN: Carlene Coria Primary Care Ellerie Arenz: Einar Pheasant Other Clinician: Referring Delaila Nand: Einar Pheasant Treating Niang Mitcheltree/Extender: Skipper Cliche in Treatment: 3 Active Problems Location of Pain Severity and Description of Pain Patient Has Paino Yes Site Locations Pain Management and Medication Current Pain Management: Electronic Signature(s) Signed: 12/19/2021 4:26:12 PM By: Carlene Coria RN Entered By: Carlene Coria on 12/18/2021 12:50:12 [place], Lahoma Rocker ([id number]) -------------------------------------------------------------------------------- Patient/Caregiver Education Details Patient Name: [name]. Date of Service: 12/18/2021 12:30 PM Medical Record Number: [id number] Patient Account Number: [id number] Date of  Birth/Gender: Feb 26, 1971 (51 y.o. M) Treating RN: Carlene Coria Primary Care Physician: Einar Pheasant Other Clinician: Referring Physician: Einar Pheasant Treating Physician/Extender: Skipper Cliche in Treatment: 3 Education Assessment Education Provided To: Patient Education Topics Provided Wound/Skin Impairment: Methods: Explain/Verbal Responses: State content correctly Electronic Signature(s) Signed: 12/19/2021 4:26:12 PM By: Carlene Coria RN Entered By: Carlene Coria on 12/18/2021 13:00:28 [place], Lahoma Rocker ([id number]) -------------------------------------------------------------------------------- Wound Assessment Details Patient Name: [name]. Date of Service: 12/18/2021 12:30 PM Medical Record Number: [id number] Patient Account Number: [id number] Date of Birth/Sex: [birth year] (51 y.o. M) Treating RN: Carlene Coria Primary Care Tasmia Blumer: Einar Pheasant Other Clinician: Referring Nafisah Runions: Einar Pheasant Treating Elianna Windom/Extender: Skipper Cliche in Treatment: 3 Wound Status Wound Number: 8 Primary Pressure Ulcer Etiology: Wound Location: Midline Sacrum Wound Status: Open Wounding Event: Gradually Appeared Comorbid Congestive Heart Failure, Type I Diabetes, History of Date Acquired: 05/07/2021 History: pressure wounds, Paraplegia Weeks Of Treatment: 3 Clustered Wound: No Photos Wound Measurements Length: (cm) 3.5 Width: (cm) 2 Depth: (cm) 0.4 Area: (cm) 5.498 Volume: (cm) 2.199 % Reduction in Area: 0% % Reduction in Volume: 0% Wound Description Classification:  Category/Stage III Exudate Amount: Medium Exudate Type: Serosanguineous Exudate Color: red, brown Treatment Notes Wound #8 (Sacrum) Wound Laterality: Midline Cleanser Byram Ancillary Kit - 15 Day Supply Discharge Instruction: Use supplies as instructed; Kit contains: (15) Saline Bullets; (15) 3x3 Gauze; 15 pr Gloves Soap and Water Discharge Instruction: Gently cleanse wound with  antibacterial soap, rinse and pat dry prior to dressing wounds Peri-Wound Care Topical Primary Dressing Silvercel 4 1/4x 4 1/4 (in/in) Discharge Instruction: Apply Silvercel 4 1/4x 4 1/4 (in/in) as instructed Secondary Dressing FILLMORE, BYNUM ([id number]) Zetuvit Plus Silicone Border Dressing 4x4 (in/in) Secured With Compression Wrap Compression Stockings Add-Ons Electronic Signature(s) Signed: 12/19/2021 4:26:12 PM By: Carlene Coria RN Entered By: Carlene Coria on 12/18/2021 12:51:34 [place], Lahoma Rocker ([id number]) -------------------------------------------------------------------------------- Wound Assessment Details Patient Name: [name]. Date of Service: 12/18/2021 12:30 PM Medical Record Number: [id number] Patient Account Number: [id number] Date of Birth/Sex: 08-07-1971 (51 y.o. M) Treating RN: Carlene Coria Primary Care Abel Hageman: Einar Pheasant Other Clinician: Referring Narjis Mira: Einar Pheasant Treating Maybel Dambrosio/Extender: Skipper Cliche in Treatment: 3 Wound Status Wound Number: 9 Primary Pressure Ulcer Etiology: Wound Location: Right Sacrum Wound Status: Open Wounding Event: Gradually Appeared Comorbid Congestive Heart Failure, Type I Diabetes, History of Date Acquired: 05/07/2021 History: pressure wounds, Paraplegia Weeks Of Treatment: 3 Clustered Wound: No Wound Measurements Length: (cm) 1 Width: (cm) 0.5 Depth: (cm) 0.3 Area: (cm) 0.393 Volume: (cm) 0.118 % Reduction in Area: 33.3% % Reduction in Volume: 33.3% Epithelialization: None Tunneling: No Undermining: No Wound Description Classification: Category/Stage III Exudate Amount: Medium Exudate Type: Serosanguineous Exudate Color: red, brown Foul Odor After Cleansing: No Slough/Fibrino Yes Wound Bed Granulation Amount: Large (67-100%) Exposed Structure Granulation Quality: Red Fascia Exposed: No Necrotic Amount: Small (1-33%) Fat Layer (Subcutaneous Tissue) Exposed: Yes Necrotic  Quality: Adherent Slough Tendon Exposed: No Muscle Exposed: No Joint Exposed: No Bone Exposed: No Treatment Notes Wound #9 (Sacrum) Wound Laterality: Right Cleanser Byram Ancillary Kit - 15 Day Supply Discharge Instruction: Use supplies as instructed; Kit contains: (15) Saline Bullets; (15) 3x3 Gauze; 15 pr Gloves Soap and Water Discharge Instruction: Gently cleanse wound with antibacterial soap, rinse and pat dry prior to dressing wounds Peri-Wound Care Topical Primary Dressing Silvercel 4 1/4x 4 1/4 (in/in) Discharge Instruction: Apply Silvercel 4 1/4x 4 1/4 (in/in) as instructed Secondary Dressing Zetuvit Plus Silicone Border Dressing 4x4 (in/in) Secured With Compression Wrap Compression Stockings BURRELL, HODAPP ([id number]) Add-Ons Electronic Signature(s) Signed: 12/19/2021 4:26:12 PM By: Carlene Coria RN Entered By: Carlene Coria on 12/18/2021 12:51:13 [place], Lahoma Rocker ([id number]) -------------------------------------------------------------------------------- Vitals Details Patient Name: [name]. Date of Service: 12/18/2021 12:30 PM Medical Record Number: [id number] Patient Account Number: [id number] Date of Birth/Sex: 1971/05/17 (51 y.o. M) Treating RN: Carlene Coria Primary Care Cece Milhouse: Einar Pheasant Other Clinician: Referring Adeel Guiffre: Einar Pheasant Treating Blessing Zaucha/Extender: Skipper Cliche in Treatment: 3 Vital Signs Time Taken: 12:42 Temperature (F): 97.5 Height (in): 72 Pulse (bpm): 56 Weight (lbs): 175 Respiratory Rate (breaths/min): 18 Body Mass Index (BMI): 23.7 Blood Pressure (mmHg): 167/84 Reference Range: 80 - 120 mg / dl Electronic Signature(s) Signed: 12/19/2021 4:26:12 PM By: Carlene Coria RN Entered By: Carlene Coria on 12/18/2021 12:42:56

## 2021-12-25 DIAGNOSIS — L89153 Pressure ulcer of sacral region, stage 3: Secondary | ICD-10-CM | POA: Diagnosis not present

## 2021-12-29 DIAGNOSIS — G8250 Quadriplegia, unspecified: Secondary | ICD-10-CM | POA: Diagnosis not present

## 2021-12-29 DIAGNOSIS — Z87440 Personal history of urinary (tract) infections: Secondary | ICD-10-CM | POA: Diagnosis not present

## 2021-12-29 DIAGNOSIS — E109 Type 1 diabetes mellitus without complications: Secondary | ICD-10-CM | POA: Diagnosis not present

## 2021-12-29 DIAGNOSIS — R339 Retention of urine, unspecified: Secondary | ICD-10-CM | POA: Diagnosis not present

## 2021-12-30 ENCOUNTER — Encounter: Payer: Self-pay | Admitting: Gastroenterology

## 2021-12-30 ENCOUNTER — Ambulatory Visit (INDEPENDENT_AMBULATORY_CARE_PROVIDER_SITE_OTHER): Payer: Medicare Other | Admitting: Gastroenterology

## 2021-12-30 VITALS — BP 121/78 | HR 53 | Temp 98.1°F

## 2021-12-30 DIAGNOSIS — K632 Fistula of intestine: Secondary | ICD-10-CM | POA: Diagnosis not present
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
## 2021-12-30 NOTE — Patient Instructions (Signed)
I will call you with day and time of your CT scan later this afternoon  If you need to call or reschedule, you can call directly to them at [phone]

## 2022-01-02 ENCOUNTER — Other Ambulatory Visit: Payer: Self-pay | Admitting: Internal Medicine

## 2022-01-08 ENCOUNTER — Ambulatory Visit: Payer: Medicare Other | Admitting: Physician Assistant

## 2022-01-09 ENCOUNTER — Ambulatory Visit
Admission: RE | Admit: 2022-01-09 | Discharge: 2022-01-09 | Disposition: A | Discharge status: Home / Self Care | Payer: Medicare Other | Source: Ambulatory Visit | Attending: Gastroenterology | Admitting: Gastroenterology

## 2022-01-09 ENCOUNTER — Other Ambulatory Visit: Payer: Self-pay

## 2022-01-09 DIAGNOSIS — G8250 Quadriplegia, unspecified: Secondary | ICD-10-CM | POA: Diagnosis not present

## 2022-01-09 DIAGNOSIS — K632 Fistula of intestine: Secondary | ICD-10-CM | POA: Diagnosis not present

## 2022-01-09 DIAGNOSIS — K769 Liver disease, unspecified: Secondary | ICD-10-CM | POA: Diagnosis not present

## 2022-01-09 DIAGNOSIS — L89309 Pressure ulcer of unspecified buttock, unspecified stage: Secondary | ICD-10-CM | POA: Diagnosis not present

## 2022-01-09 DIAGNOSIS — N200 Calculus of kidney: Secondary | ICD-10-CM | POA: Diagnosis not present

## 2022-01-12 DIAGNOSIS — M65332 Trigger finger, left middle finger: Secondary | ICD-10-CM | POA: Diagnosis not present

## 2022-01-13 ENCOUNTER — Other Ambulatory Visit: Payer: Self-pay | Admitting: Internal Medicine

## 2022-01-17 DIAGNOSIS — Z794 Long term (current) use of insulin: Secondary | ICD-10-CM | POA: Diagnosis not present

## 2022-01-17 DIAGNOSIS — E109 Type 1 diabetes mellitus without complications: Secondary | ICD-10-CM | POA: Diagnosis not present

## 2022-01-20 ENCOUNTER — Other Ambulatory Visit: Payer: Self-pay | Admitting: Internal Medicine

## 2022-01-20 MED ORDER — OMEPRAZOLE 20 MG PO CPDR: 20.0000 mg | DELAYED_RELEASE_CAPSULE | Freq: Two times a day (BID) | ORAL | 0 refills | Status: DC

## 2022-01-22 ENCOUNTER — Ambulatory Visit: Payer: Medicare Other

## 2022-01-22 ENCOUNTER — Telehealth: Payer: Self-pay

## 2022-01-22 NOTE — Telephone Encounter (Signed)
Pt calling requesting results of his recent CT scan.

## 2022-01-23 ENCOUNTER — Other Ambulatory Visit: Payer: Self-pay | Admitting: Gastroenterology

## 2022-01-23 ENCOUNTER — Encounter: Payer: Self-pay | Admitting: Gastroenterology

## 2022-01-23 DIAGNOSIS — K632 Fistula of intestine: Secondary | ICD-10-CM

## 2022-01-26 ENCOUNTER — Encounter: Payer: Medicare Other | Attending: Physician Assistant | Admitting: Physician Assistant

## 2022-01-26 ENCOUNTER — Other Ambulatory Visit: Payer: Self-pay

## 2022-01-26 ENCOUNTER — Other Ambulatory Visit: Payer: Self-pay | Admitting: Internal Medicine

## 2022-01-26 DIAGNOSIS — I5042 Chronic combined systolic (congestive) and diastolic (congestive) heart failure: Secondary | ICD-10-CM | POA: Diagnosis not present

## 2022-01-26 DIAGNOSIS — G8220 Paraplegia, unspecified: Secondary | ICD-10-CM | POA: Diagnosis not present

## 2022-01-26 DIAGNOSIS — L89153 Pressure ulcer of sacral region, stage 3: Secondary | ICD-10-CM | POA: Insufficient documentation

## 2022-01-26 DIAGNOSIS — E10621 Type 1 diabetes mellitus with foot ulcer: Secondary | ICD-10-CM | POA: Insufficient documentation

## 2022-01-26 DIAGNOSIS — E10622 Type 1 diabetes mellitus with other skin ulcer: Secondary | ICD-10-CM | POA: Diagnosis not present

## 2022-01-26 DIAGNOSIS — L98428 Non-pressure chronic ulcer of back with other specified severity: Secondary | ICD-10-CM | POA: Diagnosis not present

## 2022-01-26 NOTE — Progress Notes (Signed)
[name] ([id number]) Visit Report for 01/26/2022 Arrival Information Details Patient Name: [name], [name]. Date of Service: 01/26/2022 9:30 AM Medical Record Number: [id number] Patient Account Number: [id number] Date of Birth/Sex: 1971/02/23 (51 y.o. M) Treating RN: Cornell Barman Primary Care Kino Dunsworth: Einar Pheasant Other Clinician: Referring Sylar Voong: Einar Pheasant Treating Kennidy Lamke/Extender: Skipper Cliche in Treatment: 8 Visit Information History Since Last Visit Added or deleted any medications: No Patient Arrived: Wheel Chair Has Dressing in Place as Prescribed: Yes Arrival Time: 09:44 Pain Present Now: No Accompanied By: self Transfer Assistance: Transfer Board Patient Identification Verified: Yes Secondary Verification Process Completed: Yes Patient Requires Transmission-Based Precautions: No Patient Has Alerts: No Electronic Signature(s) Signed: 01/26/2022 11:49:58 AM By: Gretta Cool, BSN, RN, CWS, Kim RN, BSN Entered By: Gretta Cool, BSN, RN, CWS, Kim on 01/26/2022 09:46:15 [place], Lahoma Rocker ([id number]) -------------------------------------------------------------------------------- Clinic Level of Care Assessment Details Patient Name: [name]. Date of Service: 01/26/2022 9:30 AM Medical Record Number: [id number] Patient Account Number: [id number] Date of Birth/Sex: 1971/03/17 (51 y.o. M) Treating RN: Cornell Barman Primary Care Shaun Runyon: Einar Pheasant Other Clinician: Referring Micala Saltsman: Einar Pheasant Treating Tee Richeson/Extender: Skipper Cliche in Treatment: 8 Clinic Level of Care Assessment Items TOOL 4 Quantity Score _0  - Use when only an EandM is performed on FOLLOW-UP visit 0 ASSESSMENTS - Nursing Assessment / Reassessment X - Reassessment of Co-morbidities (includes updates in patient status) 1 10 X- 1 5 Reassessment of Adherence to Treatment Plan ASSESSMENTS - Wound and Skin Assessment / Reassessment _1  - Simple Wound Assessment / Reassessment -  one wound 0 X- 2 5 Complex Wound Assessment / Reassessment - multiple wounds _2  - 0 Dermatologic / Skin Assessment (not related to wound area) ASSESSMENTS - Focused Assessment _3  - Circumferential Edema Measurements - multi extremities 0 _4  - 0 Nutritional Assessment / Counseling / Intervention _5  - 0 Lower Extremity Assessment (monofilament, tuning fork, pulses) _6  - 0 Peripheral Arterial Disease Assessment (using hand held doppler) ASSESSMENTS - Ostomy and/or Continence Assessment and Care _7  - Incontinence Assessment and Management 0 _8  - 0 Ostomy Care Assessment and Management (repouching, etc.) PROCESS - Coordination of Care X - Simple Patient / Family Education for ongoing care 1 15 _9  - 0 Complex (extensive) Patient / Family Education for ongoing care X- 1 10 Staff obtains Programmer, systems, Records, Test Results / Process Orders _10  - 0 Staff telephones HHA, Nursing Homes / Clarify orders / etc _11  - 0 Routine Transfer to another Facility (non-emergent condition) _12  - 0 Routine Hospital Admission (non-emergent condition) _13  - 0 New Admissions / Biomedical engineer / Ordering NPWT, Apligraf, etc. _14  - 0 Emergency Hospital Admission (emergent condition) X- 1 10 Simple Discharge Coordination _15  - 0 Complex (extensive) Discharge Coordination PROCESS - Special Needs _16  - Pediatric / Minor Patient Management 0 _17  - 0 Isolation Patient Management _18  - 0 Hearing / Language / Visual special needs _19  - 0 Assessment of Community assistance (transportation, D/C planning, etc.) _20  - 0 Additional assistance / Altered mentation _21  - 0 Support Surface(s) Assessment (bed, cushion, seat, etc.) INTERVENTIONS - Wound Cleansing / Measurement Obando, Orlen K. ([id number]) _22  - 0 Simple Wound Cleansing - one wound X- 2 5 Complex Wound Cleansing - multiple wounds X- 1 5 Wound Imaging (photographs - any number of wounds) _23  - 0 Wound Tracing (instead of photographs) _24  - 0 Simple  Wound Measurement - one wound X- 2 5 Complex Wound Measurement - multiple wounds INTERVENTIONS - Wound Dressings _25  - Small Wound Dressing one or  multiple wounds 0 X- 1 15 Medium Wound Dressing one or multiple wounds _0  - 0 Large Wound Dressing one or multiple wounds <IRSWNIOEVOJJKKXF>_8<\/HWEXHBZJIRCVELFY>_1  - 0 Application of Medications - topical <OFBPZWCHENIDPOEU>_2<\/PNTIRWERXVQMGQQP>_6  - 0 Application of Medications - injection INTERVENTIONS - Miscellaneous _3  - External ear exam 0 _4  - 0 Specimen Collection (cultures, biopsies, blood, body fluids, etc.) _5  - 0 Specimen(s) / Culture(s) sent or taken to Lab for analysis _6  - 0 Patient Transfer (multiple staff / Civil Service fast streamer / Similar devices) _7  - 0 Simple Staple / Suture removal (25 or less) _8  - 0 Complex Staple / Suture removal (26 or more) _9  - 0 Hypo / Hyperglycemic Management (close monitor of Blood Glucose) _10  - 0 Ankle / Brachial Index (ABI) - do not check if billed separately X- 1 5 Vital Signs Has the patient been seen at the hospital within the last three years: Yes Total Score: 105 Level Of Care: New/Established - Level 3 Electronic Signature(s) Signed: 01/26/2022 11:49:58 AM By: Gretta Cool, BSN, RN, CWS, Kim RN, BSN Entered By: Gretta Cool, BSN, RN, CWS, Kim on 01/26/2022 10:08:12 [place], Lahoma Rocker ([id number]) -------------------------------------------------------------------------------- Encounter Discharge Information Details Patient Name: [name]. Date of Service: 01/26/2022 9:30 AM Medical Record Number: [id number] Patient Account Number: [id number] Date of Birth/Sex: [birth year] (51 y.o. M) Treating RN: Cornell Barman Primary Care Cassidie Veiga: Einar Pheasant Other Clinician: Referring Rosy Estabrook: Einar Pheasant Treating Kiandria Clum/Extender: Skipper Cliche in Treatment: 8 Encounter Discharge Information Items Discharge Condition: Stable Ambulatory Status: Wheelchair Discharge Destination: Home Transportation: Private Auto Accompanied By: self Schedule Follow-up Appointment:  Yes Clinical Summary of Care: Electronic Signature(s) Signed: 01/26/2022 11:49:58 AM By: Gretta Cool, BSN, RN, CWS, Kim RN, BSN Entered By: Gretta Cool, BSN, RN, CWS, Kim on 01/26/2022 10:09:47 [place], Lahoma Rocker ([id number]) -------------------------------------------------------------------------------- Lower Extremity Assessment Details Patient Name: JAYCOB, MCCLENTON. Date of Service: 01/26/2022 9:30 AM Medical Record Number: [id number] Patient Account Number: [id number] Date of Birth/Sex: [birth year] (51 y.o. M) Treating RN: Cornell Barman Primary Care Gerhart Ruggieri: Einar Pheasant Other Clinician: Referring Kimmie Doren: Einar Pheasant Treating Dauna Ziska/Extender: Skipper Cliche in Treatment: 8 Electronic Signature(s) Signed: 01/26/2022 11:49:58 AM By: Gretta Cool, BSN, RN, CWS, Kim RN, BSN Entered By: Gretta Cool, BSN, RN, CWS, Kim on 01/26/2022 10:03:57 [place], Lahoma Rocker ([id number]) -------------------------------------------------------------------------------- Multi Wound Chart Details Patient Name: [name]. Date of Service: 01/26/2022 9:30 AM Medical Record Number: [id number] Patient Account Number: [id number] Date of Birth/Sex: January 08, 1971 (51 y.o. M) Treating RN: Cornell Barman Primary Care Jerin Franzel: Einar Pheasant Other Clinician: Referring Allyn Bertoni: Einar Pheasant Treating Chaze Hruska/Extender: Skipper Cliche in Treatment: 8 Vital Signs Height(in): 72 Pulse(bpm): 30 Weight(lbs): 175 Blood Pressure(mmHg): 109/40 Body Mass Index(BMI): 23.7 Temperature(F): 97.6 Respiratory Rate(breaths/min): 18 Photos: [8:No Photos] [9:No Photos] [N/A:N/A] Wound Location: [8:Midline Sacrum] [9:Right [place] [N/A:N/A] Wounding Event: [8:Gradually Appeared] [9:Gradually Appeared] [N/A:N/A] Primary Etiology: [8:Pressure Ulcer] [9:Pressure Ulcer] [N/A:N/A] Comorbid History: [8:Congestive Heart Failure, Type I Diabetes, History of pressure wounds, Paraplegia] [9:N/A] [N/A:N/A] Date Acquired: [8:05/07/2021] [9:05/07/2021]  [N/A:N/A] Weeks of Treatment: [8:8] [9:8] [N/A:N/A] Wound Status: [8:Open] [9:Open] [N/A:N/A] Wound Recurrence: [8:No] [9:No] [N/A:N/A] Measurements L x W x D (cm) [8:2x1.5x0.6] [9:0.8x0.2x0.5] [N/A:N/A] Area (cm) : [8:2.356] [9:0.126] [N/A:N/A] Volume (cm) : [8:1.414] [9:0.063] [N/A:N/A] % Reduction in Area: [8:57.10%] [9:78.60%] [N/A:N/A] % Reduction in Volume: [8:35.70%] [9:64.40%] [N/A:N/A] Starting Position 1 (o'clock): [8:1] Ending Position 1 (o'clock): [8:1] Maximum Distance 1 (cm): [8:0.5] Undermining: [8:Yes] [9:N/A] [N/A:N/A] Classification: [8:Category/Stage III] [9:Category/Stage III] [N/A:N/A] Exudate Amount: [8:Medium] [9:Medium] [N/A:N/A] Exudate Type: [8:Serosanguineous] [9:Serosanguineous] [N/A:N/A] Exudate Color: [8:red, brown] [9:red, brown] [N/A:N/A] Wound  Margin: [8:Thickened] [9:N/A] [N/A:N/A] Granulation Amount: [8:Medium (34-66%)] [9:N/A] [N/A:N/A] Necrotic Amount: [8:Medium (34-66%)] [9:N/A] [N/A:N/A] Epithelialization: [8:None Thickened edges.] [9:N/A N/A] [N/A:N/A N/A] Treatment Notes Electronic Signature(s) Signed: 01/26/2022 11:49:58 AM By: Gretta Cool, BSN, RN, CWS, Kim RN, BSN Entered By: Gretta Cool, BSN, RN, CWS, Kim on 01/26/2022 10:04:22 [place], Lahoma Rocker ([id number]) -------------------------------------------------------------------------------- [place] Details Patient Name: ELLWYN, ERGLE. Date of Service: 01/26/2022 9:30 AM Medical Record Number: [id number] Patient Account Number: [id number] Date of Birth/Sex: Nov 07, 1971 (51 y.o. M) Treating RN: Cornell Barman Primary Care Amen Dargis: Einar Pheasant Other Clinician: Referring Armonii Sieh: Einar Pheasant Treating Jordell Outten/Extender: Skipper Cliche in Treatment: 8 Active Inactive Wound/Skin Impairment Nursing Diagnoses: Knowledge deficit related to ulceration/compromised skin integrity Goals: Patient/caregiver will verbalize understanding of skin care regimen Date Initiated:  11/27/2021 Target Resolution Date: 12/28/2021 Goal Status: Active Ulcer/skin breakdown will have a volume reduction of 30% by week 4 Date Initiated: 11/27/2021 Target Resolution Date: 01/28/2022 Goal Status: Active Ulcer/skin breakdown will have a volume reduction of 50% by week 8 Date Initiated: 11/27/2021 Target Resolution Date: 02/25/2022 Goal Status: Active Ulcer/skin breakdown will have a volume reduction of 80% by week 12 Date Initiated: 11/27/2021 Target Resolution Date: 03/28/2022 Goal Status: Active Ulcer/skin breakdown will heal within 14 weeks Date Initiated: 11/27/2021 Target Resolution Date: 04/27/2022 Goal Status: Active Interventions: Assess patient/caregiver ability to obtain necessary supplies Assess patient/caregiver ability to perform ulcer/skin care regimen upon admission and as needed Assess ulceration(s) every visit Notes: Electronic Signature(s) Signed: 01/26/2022 11:49:58 AM By: Gretta Cool, BSN, RN, CWS, Kim RN, BSN Entered By: Gretta Cool, BSN, RN, CWS, Kim on 01/26/2022 10:04:08 [place], Lahoma Rocker ([id number]) -------------------------------------------------------------------------------- Pain Assessment Details Patient Name: [name]. Date of Service: 01/26/2022 9:30 AM Medical Record Number: [id number] Patient Account Number: [id number] Date of Birth/Sex: 11-Oct-1971 (51 y.o. M) Treating RN: Cornell Barman Primary Care Daemion Mcniel: Einar Pheasant Other Clinician: Referring Cameshia Cressman: Einar Pheasant Treating Jahlia Omura/Extender: Skipper Cliche in Treatment: 8 Active Problems Location of Pain Severity and Description of Pain Patient Has Paino No Site Locations Pain Management and Medication Current Pain Management: Notes very little Electronic Signature(s) Signed: 01/26/2022 11:49:58 AM By: Gretta Cool, BSN, RN, CWS, Kim RN, BSN Entered By: Gretta Cool, BSN, RN, CWS, Kim on 01/26/2022 09:47:07 [place], Lahoma Rocker  ([id number]) -------------------------------------------------------------------------------- Patient/Caregiver Education Details Patient Name: [name] Date of Service: 01/26/2022 9:30 AM Medical Record Number: [id number] Patient Account Number: [id number] Date of Birth/Gender: June 25, 1971 (51 y.o. M) Treating RN: Cornell Barman Primary Care Physician: Einar Pheasant Other Clinician: Referring Physician: Einar Pheasant Treating Physician/Extender: Skipper Cliche in Treatment: 8 Education Assessment Education Provided To: Patient Education Topics Provided Offloading: Handouts: What is Offloadingo, Other: Keep pressure off of wounded area. Methods: Demonstration Responses: State content correctly Electronic Signature(s) Signed: 01/26/2022 11:49:58 AM By: Gretta Cool, BSN, RN, CWS, Kim RN, BSN Entered By: Gretta Cool, BSN, RN, CWS, Kim on 01/26/2022 10:08:44 [place], Lahoma Rocker ([id number]) -------------------------------------------------------------------------------- Wound Assessment Details Patient Name: [name]. Date of Service: 01/26/2022 9:30 AM Medical Record Number: [id number] Patient Account Number: [id number] Date of Birth/Sex: 06-Feb-1971 (51 y.o. M) Treating RN: Cornell Barman Primary Care Toney Lizaola: Einar Pheasant Other Clinician: Referring Kreg Earhart: Einar Pheasant Treating Hadyn Blanck/Extender: Jeri Cos Weeks in Treatment: 8 Wound Status Wound Number: 8 Primary Pressure Ulcer Etiology: Wound Location: Midline Sacrum Wound Status: Open Wounding Event: Gradually Appeared Comorbid Congestive Heart Failure, Type I Diabetes, History of Date Acquired: 05/07/2021 History: pressure wounds, Paraplegia Weeks Of Treatment: 8 Clustered Wound: No Wound Measurements Length: (cm) 2 Width: (cm) 1.5 Depth: (cm)  0.6 Area: (cm) 2.356 Volume: (cm) 1.414 % Reduction in Area: 57.1% % Reduction in Volume: 35.7% Epithelialization: None Tunneling: No Undermining: Yes Starting  Position (o'clock): 1 Ending Position (o'clock): 1 Maximum Distance: (cm) 0.5 Wound Description Classification: Category/Stage III Wound Margin: Thickened Exudate Amount: Medium Exudate Type: Serosanguineous Exudate Color: red, brown Wound Bed Granulation Amount: Medium (34-66%) Necrotic Amount: Medium (34-66%) Assessment Notes Thickened edges. Treatment Notes Wound #8 (Sacrum) Wound Laterality: Midline Cleanser Byram Ancillary Kit - 15 Day Supply Quantity: 1 Discharge Instruction: Use supplies as instructed; Kit contains: (15) Saline Bullets; (15) 3x3 Gauze; 15 pr Gloves Soap and Water Quantity: 1 Discharge Instruction: Gently cleanse wound with antibacterial soap, rinse and pat dry prior to dressing wounds Peri-Wound Care Topical Primary Dressing Silvercel Small 2x2 (in/in) Quantity: 1 Discharge Instruction: Apply Silvercel Small 2x2 (in/in) as instructed Secondary Dressing PAULINO, CORK ([id number]) Zetuvit Plus Silicone Border Dressing 4x4 (in/in) Quantity: 1 Secured With Compression Wrap Compression Stockings Environmental education officer) Signed: 01/26/2022 11:49:58 AM By: Gretta Cool, BSN, RN, CWS, Kim RN, BSN Entered By: Gretta Cool, BSN, RN, CWS, Kim on 01/26/2022 10:03:37 [place], Lahoma Rocker ([id number]) -------------------------------------------------------------------------------- Wound Assessment Details Patient Name: [name]. Date of Service: 01/26/2022 9:30 AM Medical Record Number: [id number] Patient Account Number: [id number] Date of Birth/Sex: [birth year] (51 y.o. M) Treating RN: Cornell Barman Primary Care Shoua Ressler: Einar Pheasant Other Clinician: Referring Sahan Pen: Einar Pheasant Treating Maansi Wike/Extender: Jeri Cos Weeks in Treatment: 8 Wound Status Wound Number: 9 Primary Etiology: Pressure Ulcer Wound Location: Right Sacrum Wound Status: Open Wounding Event: Gradually Appeared Date Acquired: 05/07/2021 Weeks Of Treatment: 8 Clustered Wound:  No Wound Measurements Length: (cm) 0.8 Width: (cm) 0.2 Depth: (cm) 0.5 Area: (cm) 0.126 Volume: (cm) 0.063 % Reduction in Area: 78.6% % Reduction in Volume: 64.4% Wound Description Classification: Category/Stage III Exudate Amount: Medium Exudate Type: Serosanguineous Exudate Color: red, brown Treatment Notes Wound #9 (Sacrum) Wound Laterality: Right Cleanser Byram Ancillary Kit - 15 Day Supply Quantity: 1 Discharge Instruction: Use supplies as instructed; Kit contains: (15) Saline Bullets; (15) 3x3 Gauze; 15 pr Gloves Soap and Water Quantity: 1 Discharge Instruction: Gently cleanse wound with antibacterial soap, rinse and pat dry prior to dressing wounds Peri-Wound Care Topical Primary Dressing Silvercel Small 2x2 (in/in) Quantity: 1 Discharge Instruction: Apply Silvercel Small 2x2 (in/in) as instructed Secondary Dressing Zetuvit Plus Silicone Border Dressing 4x4 (in/in) Quantity: 1 Secured With Compression Wrap Compression Stockings Environmental education officer) Signed: 01/26/2022 11:49:58 AM By: Gretta Cool, BSN, RN, CWS, Kim RN, BSN Entered By: Gretta Cool, BSN, RN, CWS, Kim on 01/26/2022 09:58:44 [place], Lahoma Rocker ([id number]) [place], Lahoma Rocker ([id number]) -------------------------------------------------------------------------------- Vitals Details Patient Name: [name]. Date of Service: 01/26/2022 9:30 AM Medical Record Number: [id number] Patient Account Number: [id number] Date of Birth/Sex: 18-Aug-1971 (51 y.o. M) Treating RN: Cornell Barman Primary Care Geryl Dohn: Einar Pheasant Other Clinician: Referring Adya Wirz: Einar Pheasant Treating Vaughan Garfinkle/Extender: Skipper Cliche in Treatment: 8 Vital Signs Time Taken: 09:46 Temperature (F): 97.6 Height (in): 72 Pulse (bpm): 56 Weight (lbs): 175 Respiratory Rate (breaths/min): 18 Body Mass Index (BMI): 23.7 Blood Pressure (mmHg): 109/40 Reference Range: 80 - 120 mg / dl Electronic Signature(s) Signed:  01/26/2022 11:49:58 AM By: Gretta Cool, BSN, RN, CWS, Kim RN, BSN Entered By: Gretta Cool, BSN, RN, CWS, Kim on 01/26/2022 [phone]

## 2022-01-26 NOTE — Progress Notes (Addendum)
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 really worry too much about where he was sitting for how long he was sitting due to the fact that this really was not a "pressure ulcer. With that being said I am concerned that this probably is a pressure ulcer based on what we are seeing here and I think how much he sits or the lack thereof is probably can have a direct impact on how well he heals to be perfectly honest. I discussed that with him today. With that being said I do believe that the silver alginate is actually doing a pretty good job based on what I see currently and I discussed that with him as well. I am not certain that there is anything that is clinically better. We discussed the possibility of collagen although to be perfectly honest I am not certain that with the amount of drainage she has but that he can get a be beneficial for him to be perfectly honest. I really think the fact that he sit most of the day in his chair is probably the biggest culprit. Also I do not think he is can be a good candidate for a wound VAC due to how fragile and how much the skin folds and on himself at this location. 12/18/2021 upon evaluation today patient's wound is actually showing some signs of improvement  noted currently. Fortunately there does not appear to be any evidence of active infection locally nor systemically at this time which is great news. He may have a little bit of epithelial growth as well which is also great news. Fortunately I do not see any signs of active infection which is great news. Overall I think  that he is making good progress considering the wound and where it is at. 01/26/2022 upon evaluation today patient's sacral wound actually is showing some signs of improvement. This is actually a very slow process and I explained that is going to be due to how the skin folds are folding in on themselves as well. Fortunately I do not see any signs of active infection locally nor systemically at this point which is great news. With that being said I overall feel that we are headed in the right direction this is just can take some time. Objective Constitutional Well-nourished and well-hydrated in no acute distress. Vitals Time Taken: 9:46 AM, Height: 72 in, Weight: 175 lbs, BMI: 23.7, Temperature: 97.6 F, Pulse: 56 bpm, Respiratory Rate: 18 breaths/min, Blood Pressure: 109/40 mmHg. Respiratory normal breathing without difficulty. Psychiatric this patient is able to make decisions and demonstrates good insight into disease process. Alert and Oriented x 3. pleasant and cooperative. General Notes: Upon inspection patient's wound bed actually showed signs of good granulation and epithelization at this point. Fortunately I do not see any evidence of active infection locally nor systemically which is great news and overall very pleased With where we stand at this point. Integumentary (Hair, Skin) Wound #8 status is Open. Original cause of wound was Gradually Appeared. The date acquired was: 05/07/2021. The wound has been in treatment 8 weeks. The wound is located on the Midline Sacrum. The wound measures 2cm length x 1.5cm width x 0.6cm depth; 2.356cm^2 area and 1.414cm^3 volume. There is  no tunneling noted, however, there is undermining starting at 1:00 and ending at 1:00 with a maximum distance of 0.5cm. There is a medium amount of serosanguineous drainage noted. The wound margin is thickened. There is medium (34-66%) granulation within the wound bed. There is a medium (34-66%) amount of necrotic tissue within the wound bed. [name], [name] ([id number]) General Notes: Thickened edges. Wound #9 status is Open. Original cause of wound was Gradually Appeared. The date acquired was: 05/07/2021. The wound has been in treatment 8 weeks. The wound is located on the Right Sacrum. The wound measures 0.8cm length x 0.2cm width x 0.5cm depth; 0.126cm^2 area and 0.063cm^3 volume. There is a medium amount of serosanguineous drainage noted. 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 Bathing/ Shower/ Hygiene: May shower; gently cleanse wound with antibacterial soap, rinse and pat dry prior to dressing wounds WOUND #8: - Sacrum Wound Laterality: Midline Cleanser: Byram Ancillary Kit - 15 Day Supply (DME) (Generic) 1 x Per Day/30 Days Discharge Instructions: Use supplies as instructed; Kit contains: (15) Saline Bullets; (15) 3x3 Gauze; 15 pr Gloves Cleanser: Soap and Water 1 x Per Day/30 Days Discharge Instructions: Gently cleanse wound with antibacterial soap, rinse and pat dry prior to dressing wounds Primary Dressing: Silvercel Small 2x2 (in/in) (DME) (Generic) 1 x Per Day/30 Days Discharge Instructions: Apply Silvercel Small 2x2 (in/in) as instructed Secondary Dressing: Zetuvit Plus Silicone Border Dressing 4x4 (in/in) (DME) (Generic) 3 x Per Week/30 Days WOUND #9: - Sacrum Wound Laterality: Right Cleanser: Byram Ancillary Kit - 15  Day Supply (DME) (Generic) 1 x Per Day/30 Days Discharge Instructions: Use supplies as instructed; Kit contains: (15) Saline Bullets; (15) 3x3 Gauze; 15 pr Gloves Cleanser: Soap and Water 1 x Per Day/30 Days Discharge Instructions: Gently cleanse wound  with antibacterial soap, rinse and pat dry prior to dressing wounds Primary Dressing: Silvercel Small 2x2 (in/in) (DME) (Generic) 1 x Per Day/30 Days Discharge Instructions: Apply Silvercel Small 2x2 (in/in) as instructed Secondary Dressing: Zetuvit Plus Silicone Border Dressing 4x4 (in/in) (DME) (Generic) 3 x Per Week/30 Days 1. Would recommend that we going continue with the wound care measures as before and the patient is in agreement with the plan. This includes the use of the silver alginate dressing which I think is still probably our best bet currently. 2. Also can recommend that we continue with the Zetuvit dressing to cover. 3. I would also suggest the patient continue to appropriately offload I think that is can be of utmost importance. 4. We will also continue to monitor for any signs of infection if anything changes he should let me know. We will see patient back for reevaluation in 1 week here in the clinic. If anything worsens or changes patient will contact our office for additional recommendations. Electronic Signature(s) Signed: 01/26/2022 10:55:12 AM By: Worthy Keeler PA-C Entered By: Worthy Keeler on 01/26/2022 10:55:11 [name], [name] ([id number]) -------------------------------------------------------------------------------- SuperBill Details Patient Name: [name]. Date of Service: 01/26/2022 Medical Record Number: [id number] Patient Account Number: [id number] Date of Birth/Sex: 07-Aug-1971 (51 y.o. 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 I50.42 Chronic combined systolic (congestive) and diastolic (congestive) heart failure Facility Procedures CPT4 Code: [id number] Description: 99213 - WOUND CARE VISIT-LEV 3 EST PT Modifier: Quantity: 1 Physician Procedures CPT4 Code: 0867619 Description: 99214 - WC PHYS LEVEL 4 - EST PT Modifier: Quantity: 1 CPT4 Code: Description: ICD-10 Diagnosis Description L89.153 Pressure ulcer of sacral region, stage 3 G82.20 Paraplegia, unspecified E10.621 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

## 2022-01-27 DIAGNOSIS — L89153 Pressure ulcer of sacral region, stage 3: Secondary | ICD-10-CM | POA: Diagnosis not present

## 2022-01-29 ENCOUNTER — Ambulatory Visit: Payer: Medicare Other | Admitting: Physician Assistant

## 2022-02-03 ENCOUNTER — Other Ambulatory Visit: Payer: Self-pay

## 2022-02-03 ENCOUNTER — Ambulatory Visit
Admission: RE | Admit: 2022-02-03 | Discharge: 2022-02-03 | Disposition: A | Discharge status: Home / Self Care | Payer: Medicare Other | Source: Ambulatory Visit | Attending: Gastroenterology | Admitting: Gastroenterology

## 2022-02-03 DIAGNOSIS — N368 Other specified disorders of urethra: Secondary | ICD-10-CM | POA: Diagnosis not present

## 2022-02-03 DIAGNOSIS — K632 Fistula of intestine: Secondary | ICD-10-CM | POA: Insufficient documentation

## 2022-02-03 DIAGNOSIS — Z9889 Other specified postprocedural states: Secondary | ICD-10-CM | POA: Diagnosis not present

## 2022-02-03 MED ORDER — GADOBUTROL 1 MMOL/ML IV SOLN
7.5000 mL | Freq: Once | INTRAVENOUS | Status: AC | PRN
  Administered 2022-02-03: 7.5 mL via INTRAVENOUS

## 2022-02-10 DIAGNOSIS — L89153 Pressure ulcer of sacral region, stage 3: Secondary | ICD-10-CM | POA: Diagnosis not present

## 2022-02-17 DIAGNOSIS — E109 Type 1 diabetes mellitus without complications: Secondary | ICD-10-CM | POA: Diagnosis not present

## 2022-02-17 DIAGNOSIS — Z794 Long term (current) use of insulin: Secondary | ICD-10-CM | POA: Diagnosis not present

## 2022-02-19 ENCOUNTER — Ambulatory Visit: Payer: Medicare Other | Admitting: Physician Assistant

## 2022-02-19 DIAGNOSIS — Z794 Long term (current) use of insulin: Secondary | ICD-10-CM | POA: Diagnosis not present

## 2022-02-19 DIAGNOSIS — E1140 Type 2 diabetes mellitus with diabetic neuropathy, unspecified: Secondary | ICD-10-CM | POA: Diagnosis not present

## 2022-02-19 DIAGNOSIS — L97521 Non-pressure chronic ulcer of other part of left foot limited to breakdown of skin: Secondary | ICD-10-CM | POA: Diagnosis not present

## 2022-02-23 ENCOUNTER — Other Ambulatory Visit: Payer: Self-pay

## 2022-02-23 ENCOUNTER — Encounter: Payer: Medicare Other | Attending: Physician Assistant | Admitting: Physician Assistant

## 2022-02-23 DIAGNOSIS — I5042 Chronic combined systolic (congestive) and diastolic (congestive) heart failure: Secondary | ICD-10-CM | POA: Diagnosis not present

## 2022-02-23 DIAGNOSIS — E1051 Type 1 diabetes mellitus with diabetic peripheral angiopathy without gangrene: Secondary | ICD-10-CM | POA: Insufficient documentation

## 2022-02-23 DIAGNOSIS — I110 Hypertensive heart disease with heart failure: Secondary | ICD-10-CM | POA: Diagnosis not present

## 2022-02-23 DIAGNOSIS — E10621 Type 1 diabetes mellitus with foot ulcer: Secondary | ICD-10-CM | POA: Diagnosis not present

## 2022-02-23 DIAGNOSIS — G8220 Paraplegia, unspecified: Secondary | ICD-10-CM | POA: Insufficient documentation

## 2022-02-23 DIAGNOSIS — Z981 Arthrodesis status: Secondary | ICD-10-CM | POA: Diagnosis not present

## 2022-02-23 DIAGNOSIS — L89153 Pressure ulcer of sacral region, stage 3: Secondary | ICD-10-CM | POA: Insufficient documentation

## 2022-02-23 DIAGNOSIS — L98492 Non-pressure chronic ulcer of skin of other sites with fat layer exposed: Secondary | ICD-10-CM | POA: Diagnosis not present

## 2022-02-23 NOTE — Progress Notes (Addendum)
ELIGE, SHOUSE ([id number]) ?Visit Report for 02/23/2022 ?Arrival Information Details ?Patient Name: [name], [name] ?Date of Service: 02/23/2022 2:00 PM ?Medical Record Number: [id number] ?Patient Account Number: [id number] ?Date of Birth/Sex: 1971/05/07 (51 y.o. M) ?Treating RN: Carlene Coria ?Primary Care Katharyn Schauer: Einar Pheasant Other Clinician: ?Referring Ebonie Westerlund: Einar Pheasant ?Treating Tamakia Porto/Extender: Jeri Cos ?Weeks in Treatment: 12 ?Visit Information History Since Last Visit ?All ordered tests and consults were completed: No ?Patient Arrived: Wheel Chair ?Added or deleted any medications: No ?Arrival Time: 14:05 ?Any new allergies or adverse reactions: No ?Accompanied By: self ?Had a fall or experienced change in No ?Transfer Assistance: None ?activities of daily living that may affect ?Patient Identification Verified: Yes ?risk of falls: ?Secondary Verification Process Completed: Yes ?Signs or symptoms of abuse/neglect since last visito No ?Patient Requires Transmission-Based Precautions: No ?Hospitalized since last visit: No ?Patient Has Alerts: No ?Implantable device outside of the clinic excluding No ?cellular tissue based products placed in the center ?since last visit: ?Has Dressing in Place as Prescribed: Yes ?Pain Present Now: No ?Electronic Signature(s) ?Signed: 02/23/2022 2:47:27 PM By: Carlene Coria RN ?Entered ByCarlene Coria on 02/23/2022 14:08:57 ?[name], [name] ([id number]) ?-------------------------------------------------------------------------------- ?Clinic Level of Care Assessment Details ?Patient Name: [name], [name] ?Date of Service: 02/23/2022 2:00 PM ?Medical Record Number: [id number] ?Patient Account Number: [id number] ?Date of Birth/Sex: 09/18/1971 (51 y.o. M) ?Treating RN: Carlene Coria ?Primary Care Teeghan Hammer: Einar Pheasant Other Clinician: ?Referring Georgiana Spillane: Einar Pheasant ?Treating Terrell Ostrand/Extender: Jeri Cos ?Weeks in Treatment: 12 ?Clinic Level of Care  Assessment Items ?TOOL 4 Quantity Score ?X - Use when only an EandM is performed on FOLLOW-UP visit 1 0 ?ASSESSMENTS - Nursing Assessment / Reassessment ?X - Reassessment of Co-morbidities (includes updates in patient status) 1 10 ?X- 1 5 ?Reassessment of Adherence to Treatment Plan ?ASSESSMENTS - Wound and Skin Assessment / Reassessment ?[]  - Simple Wound Assessment / Reassessment - one wound 0 ?X- 2 5 ?Complex Wound Assessment / Reassessment - multiple wounds ?[]  - 0 ?Dermatologic / Skin Assessment (not related to wound area) ?ASSESSMENTS - Focused Assessment ?[]  - Circumferential Edema Measurements - multi extremities 0 ?[]  - 0 ?Nutritional Assessment / Counseling / Intervention ?[]  - 0 ?Lower Extremity Assessment (monofilament, tuning fork, pulses) ?[]  - 0 ?Peripheral Arterial Disease Assessment (using hand held doppler) ?ASSESSMENTS - Ostomy and/or Continence Assessment and Care ?[]  - Incontinence Assessment and Management 0 ?[]  - 0 ?Ostomy Care Assessment and Management (repouching, etc.) ?PROCESS - Coordination of Care ?X - Simple Patient / Family Education for ongoing care 1 15 ?[]  - 0 ?Complex (extensive) Patient / Family Education for ongoing care ?[]  - 0 ?Staff obtains Consents, Records, Test Results / Process Orders ?[]  - 0 ?Staff telephones HHA, Nursing Homes / Clarify orders / etc ?[]  - 0 ?Routine Transfer to another Facility (non-emergent condition) ?[]  - 0 ?Routine Hospital Admission (non-emergent condition) ?[]  - 0 ?New Admissions / Biomedical engineer / Ordering NPWT, Apligraf, etc. ?[]  - 0 ?Emergency Hospital Admission (emergent condition) ?X- 1 10 ?Simple Discharge Coordination ?[]  - 0 ?Complex (extensive) Discharge Coordination ?PROCESS - Special Needs ?[]  - Pediatric / Minor Patient Management 0 ?[]  - 0 ?Isolation Patient Management ?[]  - 0 ?Hearing / Language / Visual special needs ?[]  - 0 ?Assessment of Community assistance (transportation, D/C planning, etc.) ?[]  - 0 ?Additional  assistance / Altered mentation ?[]  - 0 ?Support Surface(s) Assessment (bed, cushion, seat, etc.) ?INTERVENTIONS - Wound Cleansing / Measurement ?[name], [name] ([id number]) ?[]  - 0 ?Simple Wound Cleansing -  one wound ?X- 2 5 ?Complex Wound Cleansing - multiple wounds ?X- 1 5 ?Wound Imaging (photographs - any number of wounds) ?[]  - 0 ?Wound Tracing (instead of photographs) ?[]  - 0 ?Simple Wound Measurement - one wound ?X- 2 5 ?Complex Wound Measurement - multiple wounds ?INTERVENTIONS - Wound Dressings ?X - Small Wound Dressing one or multiple wounds 2 10 ?[]  - 0 ?Medium Wound Dressing one or multiple wounds ?[]  - 0 ?Large Wound Dressing one or multiple wounds ?[]  - 0 ?Application of Medications - topical ?[]  - 0 ?Application of Medications - injection ?INTERVENTIONS - Miscellaneous ?[]  - External ear exam 0 ?[]  - 0 ?Specimen Collection (cultures, biopsies, blood, body fluids, etc.) ?[]  - 0 ?Specimen(s) / Culture(s) sent or taken to Lab for analysis ?[]  - 0 ?Patient Transfer (multiple staff / Civil Service fast streamer / Similar devices) ?[]  - 0 ?Simple Staple / Suture removal (25 or less) ?[]  - 0 ?Complex Staple / Suture removal (26 or more) ?[]  - 0 ?Hypo / Hyperglycemic Management (close monitor of Blood Glucose) ?[]  - 0 ?Ankle / Brachial Index (ABI) - do not check if billed separately ?X- 1 5 ?Vital Signs ?Has the patient been seen at the hospital within the last three years: Yes ?Total Score: 100 ?Level Of Care: New/Established - Level ?3 ?Electronic Signature(s) ?Signed: 02/23/2022 2:47:27 PM By: Carlene Coria RN ?Entered ByCarlene Coria on 02/23/2022 14:43:39 ?[name], [name] ([id number]) ?-------------------------------------------------------------------------------- ?Complex / Palliative Patient Assessment Details ?Patient Name: [name], [name] ?Date of Service: 02/23/2022 2:00 PM ?Medical Record Number: [id number] ?Patient Account Number: [id number] ?Date of Birth/Sex: December 21, 1970 (51 y.o. M) ?Treating RN: Donnamarie Poag ?Primary Care Berniece Abid: Einar Pheasant Other Clinician: ?Referring Clarie Camey: Einar Pheasant ?Treating Petrina Melby/Extender: Jeri Cos ?Weeks in Treatment: 12 ?Complex Wound Management Criteria ?Patient has remarkable or complex co-morbidities requiring medications or treatments that extend wound healing times. Examples: ?o? Diabetes mellitus with chronic renal failure or end stage renal disease requiring dialysis ?o? Advanced or poorly controlled rheumatoid arthritis ?o? Diabetes mellitus and end stage chronic obstructive pulmonary disease ?o? Active cancer with current chemo- or radiation therapy ?DM 1 and paraplegia ?Palliative Wound Management Criteria ?Care Approach ?Wound Care Plan: Complex Wound Management ?Electronic Signature(s) ?Signed: 03/19/2022 11:27:51 AM By: Donnamarie Poag ?Signed: 03/20/2022 4:44:39 PM By: Worthy Keeler PA-C ?Previous Signature: 03/19/2022 11:27:29 AM Version By: Donnamarie Poag ?Entered ByDonnamarie Poag on 03/19/2022 11:27:50 ?[name], [name] ([id number]) ?-------------------------------------------------------------------------------- ?Encounter Discharge Information Details ?Patient Name: [name], [name] ?Date of Service: 02/23/2022 2:00 PM ?Medical Record Number: [id number] ?Patient Account Number: [id number] ?Date of Birth/Sex: [birth year] (51 y.o. M) ?Treating RN: Carlene Coria ?Primary Care Jai Steil: Einar Pheasant Other Clinician: ?Referring Jarmal Lewelling: Einar Pheasant ?Treating Stefan Karen/Extender: Jeri Cos ?Weeks in Treatment: 12 ?Encounter Discharge Information Items ?Discharge Condition: Stable ?Ambulatory Status: Wheelchair ?Discharge Destination: Home ?Transportation: Private Auto ?Accompanied By: self ?Schedule Follow-up Appointment: Yes ?Clinical Summary of Care: Patient Declined ?Electronic Signature(s) ?Signed: 02/23/2022 2:47:27 PM By: Carlene Coria RN ?Entered By: Carlene Coria on 02/23/2022 14:45:09 ?[name], [name]  ([id number]) ?-------------------------------------------------------------------------------- ?Lower Extremity Assessment Details ?Patient Name: [name], [name] ?Date of Service: 02/23/2022 2:00 PM ?Medical Record Number: [id number] ?Patient Account Number: [id number] ?Date o
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
## 2022-02-24 ENCOUNTER — Other Ambulatory Visit: Payer: Self-pay | Admitting: Internal Medicine

## 2022-02-24 DIAGNOSIS — L89153 Pressure ulcer of sacral region, stage 3: Secondary | ICD-10-CM | POA: Diagnosis not present

## 2022-02-25 ENCOUNTER — Ambulatory Visit: Payer: Medicare Other | Admitting: Surgery

## 2022-02-25 ENCOUNTER — Ambulatory Visit (INDEPENDENT_AMBULATORY_CARE_PROVIDER_SITE_OTHER): Payer: Medicare Other | Admitting: Surgery

## 2022-02-25 ENCOUNTER — Encounter: Payer: Self-pay | Admitting: Surgery

## 2022-02-25 ENCOUNTER — Other Ambulatory Visit: Payer: Self-pay

## 2022-02-25 VITALS — BP 149/90 | HR 56 | Temp 97.8°F | Ht 72.0 in

## 2022-02-25 DIAGNOSIS — K603 Anal fistula: Secondary | ICD-10-CM | POA: Diagnosis not present

## 2022-02-25 NOTE — Patient Instructions (Addendum)
If you have any concerns or questions, please feel free to call our office.  ? ?Anal Fistula ?An anal fistula is a hole that develops between the bowel and the skin near the anus. The anus allows stool (feces) to leave the body. The anus has many tiny glands that make lubricating fluid. Sometimes, these glands become plugged and infected. This can cause a fluid-filled pocket (abscess) to form. An anal fistula often occurs when an abscess becomes infected and then develops into a hole between the bowel and the skin. ?What are the causes? ?In most cases, an anal fistula is caused by a past or current buildup of pus around the anus (anal abscess). Other causes include: ?A complication of surgery. ?Injury to the rectum or the area around it. ?Using high-energy beams (radiation) to treat the area around the rectum. ?What increases the risk? ?You are more likely to develop this condition if you have certain medical conditions or diseases, including: ?Chronic inflammatory bowel disease, such as Crohn's disease or ulcerative colitis. ?Colon cancer or rectal cancer. ?Diverticular disease, such as diverticulitis. ?A sexually transmitted infection, or STI, such as gonorrhea, chlamydia, or syphilis. ?An infection that is caused by HIV. ?What are the signs or symptoms? ?Symptoms of this condition include: ?Throbbing or constant pain that may be worse while you are sitting. ?Swelling or irritation around the anus. ?Pus or blood from an opening near the anus. ?Pain when passing stool. ?Fever or chills. ?How is this diagnosed? ?This condition is diagnosed based on: ?A physical exam. This may include: ?An exam to find the external opening of the fistula. ?An exam with a probe or scope to help locate the internal opening of the fistula. ?An exam of the rectum with a gloved hand (digital rectal exam). ?Imaging tests that use dye to find the exact location and path of the fistula. Tests may include: ?X-rays. ?Ultrasound. ?CT  scan. ?MRI. ?Other tests to find the cause of the anal fistula. ?How is this treated? ?This condition is most commonly treated with surgery. The type of surgery that is used will depend on where the fistula is located and how complex the fistula is. Surgery may include: ?A fistulotomy. The whole fistula is opened up, and the contents are drained to promote healing. ?Seton placement. A silk string (seton) is placed into the fistula during a fistulotomy. This helps to drain any infection and promote healing. ?Advancement flap procedure. Tissue is removed from your rectum or the skin around the anus and attached to the opening of the fistula. ?Bioprosthetic plug. A cone-shaped plug is made from your tissue and is used to block the opening of the fistula. ?Some anal fistulas do not require surgery. A nonsurgical treatment option involves injecting a fibrin glue to seal the fistula. You also may be prescribed an antibiotic medicine to treat any infection. ?Follow these instructions at home: ?Medicines ?Take over-the-counter and prescription medicines only as told by your health care provider. ?If you were prescribed an antibiotic medicine, take it as told by your health care provider. Do not stop taking the antibiotic even if you start to feel better. ?Use a stool softener or a laxative if told to do so by your health care provider. ?General instructions ? ?Eat a high-fiber diet as told by your health care provider. This can help to prevent constipation. ?Drink enough fluid to keep your urine pale yellow. ?Take a warm sitz bath for 15-20 minutes, 3-4 times per day, or as told by your health  care provider. Sitz baths can ease your pain and discomfort and help with healing. ?Follow good hygiene to keep the anal area as clean and dry as possible. Use wet toilet paper or a moist towelette after each bowel movement. ?Keep all follow-up visits as told by your health care provider. This is important. ?Contact a health care  provider if you have: ?Increased pain that is not controlled with medicines. ?New redness or swelling around the anal area. ?New fluid, blood, or pus coming from the anal area. ?Tenderness or warmth around the anal area. ?Get help right away if you have: ?A fever. ?Severe pain. ?Chills or diarrhea. ?Severe problems urinating or having a bowel movement. ?Summary ?An anal fistula is a hole that develops between the bowel and the skin near the anus. ?This condition is most often caused by a buildup of pus around the anus (anal abscess). Other causes include a complication of surgery, an injury to the rectum, or the use of radiation to treat the rectal area. ?This condition is most commonly treated with surgery. ?Follow your health care provider's instructions about taking medicines, eating and drinking, or taking sitz baths. ?Call your health care provider if you have more pain, swelling, or blood. Get help right away if you have fever, severe pain, or problems passing urine or stool. ?This information is not intended to replace advice given to you by your health care provider. Make sure you discuss any questions you have with your health care provider. ?Document Revised: 06/11/2021 Document Reviewed: 06/11/2021 ?Elsevier Patient Education ? [place]. ? ?

## 2022-03-02 ENCOUNTER — Encounter: Payer: Self-pay | Admitting: Surgery
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
## 2022-03-03 ENCOUNTER — Encounter: Payer: Self-pay | Admitting: Internal Medicine

## 2022-03-03 ENCOUNTER — Other Ambulatory Visit: Payer: Self-pay

## 2022-03-03 ENCOUNTER — Ambulatory Visit (INDEPENDENT_AMBULATORY_CARE_PROVIDER_SITE_OTHER): Payer: Medicare Other | Admitting: Internal Medicine

## 2022-03-03 VITALS — BP 160/92 | HR 52 | Temp 98.0°F | Ht 72.0 in | Wt 170.0 lb

## 2022-03-03 DIAGNOSIS — R159 Full incontinence of feces: Secondary | ICD-10-CM

## 2022-03-03 DIAGNOSIS — K219 Gastro-esophageal reflux disease without esophagitis: Secondary | ICD-10-CM | POA: Diagnosis not present

## 2022-03-03 DIAGNOSIS — F1010 Alcohol abuse, uncomplicated: Secondary | ICD-10-CM | POA: Diagnosis not present

## 2022-03-03 DIAGNOSIS — L98491 Non-pressure chronic ulcer of skin of other sites limited to breakdown of skin: Secondary | ICD-10-CM | POA: Diagnosis not present

## 2022-03-03 DIAGNOSIS — Z87440 Personal history of urinary (tract) infections: Secondary | ICD-10-CM | POA: Diagnosis not present

## 2022-03-03 DIAGNOSIS — I5032 Chronic diastolic (congestive) heart failure: Secondary | ICD-10-CM

## 2022-03-03 DIAGNOSIS — E871 Hypo-osmolality and hyponatremia: Secondary | ICD-10-CM | POA: Diagnosis not present

## 2022-03-03 DIAGNOSIS — G8220 Paraplegia, unspecified: Secondary | ICD-10-CM

## 2022-03-03 DIAGNOSIS — D62 Acute posthemorrhagic anemia: Secondary | ICD-10-CM

## 2022-03-03 DIAGNOSIS — G903 Multi-system degeneration of the autonomic nervous system: Secondary | ICD-10-CM | POA: Diagnosis not present

## 2022-03-03 DIAGNOSIS — E1069 Type 1 diabetes mellitus with other specified complication: Secondary | ICD-10-CM | POA: Diagnosis not present

## 2022-03-03 DIAGNOSIS — F339 Major depressive disorder, recurrent, unspecified: Secondary | ICD-10-CM

## 2022-03-03 MED ORDER — OMEPRAZOLE 20 MG PO CPDR: 20.0000 mg | DELAYED_RELEASE_CAPSULE | Freq: Every day | ORAL | 3 refills | Status: DC
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
## 2022-03-04 DIAGNOSIS — L89153 Pressure ulcer of sacral region, stage 3: Secondary | ICD-10-CM | POA: Diagnosis not present

## 2022-03-04 LAB — CBC WITH DIFFERENTIAL/PLATELET
Basophils Absolute: 0.1 10*3/uL (ref 0.0–0.1)
Basophils Relative: 0.6 % (ref 0.0–3.0)
Eosinophils Absolute: 0 10*3/uL (ref 0.0–0.7)
Eosinophils Relative: 0.5 % (ref 0.0–5.0)
HCT: 36.8 % — ABNORMAL LOW (ref 39.0–52.0)
Hemoglobin: 12.7 g/dL — ABNORMAL LOW (ref 13.0–17.0)
Lymphocytes Relative: 7.8 % — ABNORMAL LOW (ref 12.0–46.0)
Lymphs Abs: 0.7 10*3/uL (ref 0.7–4.0)
MCHC: 34.5 g/dL (ref 30.0–36.0)
MCV: 95 fl (ref 78.0–100.0)
Monocytes Absolute: 0.4 10*3/uL (ref 0.1–1.0)
Monocytes Relative: 4.2 % (ref 3.0–12.0)
Neutro Abs: 8.1 10*3/uL — ABNORMAL HIGH (ref 1.4–7.7)
Neutrophils Relative %: 86.9 % — ABNORMAL HIGH (ref 43.0–77.0)
Platelets: 237 10*3/uL (ref 150.0–400.0)
RBC: 3.88 Mil/uL — ABNORMAL LOW (ref 4.22–5.81)
RDW: 13.1 % (ref 11.5–15.5)
WBC: 9.4 10*3/uL (ref 4.0–10.5)

## 2022-03-04 LAB — BASIC METABOLIC PANEL
BUN: 9 mg/dL (ref 6–23)
CO2: 30 mEq/L (ref 19–32)
Calcium: 8.9 mg/dL (ref 8.4–10.5)
Chloride: 93 mEq/L — ABNORMAL LOW (ref 96–112)
Creatinine, Ser: 0.75 mg/dL (ref 0.40–1.50)
GFR: 104.15 mL/min (ref >60.00)
Glucose, Bld: 102 mg/dL — ABNORMAL HIGH (ref 70–99)
Potassium: 4.3 mEq/L (ref 3.5–5.1)
Sodium: 129 mEq/L — ABNORMAL LOW (ref 135–145)

## 2022-03-04 LAB — LIPID PANEL
Cholesterol: 151 mg/dL (ref 0–200)
HDL: 69.3 mg/dL (ref >39.00)
LDL Cholesterol: 71 mg/dL (ref 0–99)
NonHDL: 82.09
Total CHOL/HDL Ratio: 2
Triglycerides: 57 mg/dL (ref 0.0–149.0)
VLDL: 11.4 mg/dL (ref 0.0–40.0)

## 2022-03-04 LAB — HEPATIC FUNCTION PANEL
ALT: 11 U/L (ref 0–53)
AST: 17 U/L (ref 0–37)
Albumin: 4 g/dL (ref 3.5–5.2)
Alkaline Phosphatase: 98 U/L (ref 39–117)
Bilirubin, Direct: 0.1 mg/dL (ref 0.0–0.3)
Total Bilirubin: 0.5 mg/dL (ref 0.2–1.2)
Total Protein: 6.4 g/dL (ref 6.0–8.3)

## 2022-03-04 LAB — MICROALBUMIN / CREATININE URINE RATIO
Creatinine,U: 7 mg/dL
Microalb Creat Ratio: 10 mg/g (ref 0.0–30.0)
Microalb, Ur: <0.7 mg/dL (ref 0.0–1.9)

## 2022-03-04 LAB — HEMOGLOBIN A1C: Hgb A1c MFr Bld: 5.9 % (ref 4.6–6.5)

## 2022-03-04 LAB — TSH: TSH: 1.08 u[IU]/mL (ref 0.35–5.50)

## 2022-03-06 ENCOUNTER — Other Ambulatory Visit: Payer: Self-pay | Admitting: *Deleted

## 2022-03-06 DIAGNOSIS — E871 Hypo-osmolality and hyponatremia: Secondary | ICD-10-CM

## 2022-03-08 ENCOUNTER — Encounter: Payer: Self-pay | Admitting: Internal Medicine

## 2022-03-09 ENCOUNTER — Other Ambulatory Visit (INDEPENDENT_AMBULATORY_CARE_PROVIDER_SITE_OTHER): Payer: Medicare Other

## 2022-03-09 DIAGNOSIS — E871 Hypo-osmolality and hyponatremia: Secondary | ICD-10-CM

## 2022-03-09 DIAGNOSIS — K219 Gastro-esophageal reflux disease without esophagitis: Secondary | ICD-10-CM | POA: Insufficient documentation

## 2022-03-09 DIAGNOSIS — E109 Type 1 diabetes mellitus without complications: Secondary | ICD-10-CM | POA: Diagnosis not present

## 2022-03-09 DIAGNOSIS — Z794 Long term (current) use of insulin: Secondary | ICD-10-CM | POA: Diagnosis not present

## 2022-03-09 NOTE — Assessment & Plan Note (Signed)
Saw surgery.  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  Sitz baths and local wound care.  Follow  ?

## 2022-03-09 NOTE — Assessment & Plan Note (Signed)
Has had issues with low sodium.  Has had extensive w/up. Has seen neurology.  Follow metabolic panel.  Discussed need for decrease alcohol intake.  ?

## 2022-03-09 NOTE — Assessment & Plan Note (Signed)
Has seen Dr Gabriel Carina.  Discussed diet.  No significant problems with low sugars.  Follow met b and a1c.  ?

## 2022-03-09 NOTE — Assessment & Plan Note (Signed)
Per cardiology.  Has lasix to take as needed.  Follow volume status.  Follow pressures.  F/u with cardiology.  ?

## 2022-03-09 NOTE — Assessment & Plan Note (Signed)
Has been followed by cardiology.  Overall relatively stable.  Follow.  ?

## 2022-03-09 NOTE — Assessment & Plan Note (Signed)
Followed by urology.   

## 2022-03-09 NOTE — Assessment & Plan Note (Signed)
Lives by himself.  Independent in ADLs.   ?

## 2022-03-09 NOTE — Assessment & Plan Note (Signed)
Just evaluated by podiatry 02/19/22.  Continue f/u with podiatry.  ?

## 2022-03-09 NOTE — Assessment & Plan Note (Signed)
Have discussed the need to decrease alcohol intake.  ?

## 2022-03-09 NOTE — Assessment & Plan Note (Signed)
Discussed.  rx for prilosec 37m q day.  Follow.  If persistent acid reflux or upper GI symptoms, will need f/u with Dr WAllen Norris  ?

## 2022-03-09 NOTE — Assessment & Plan Note (Signed)
Follow cbc.  

## 2022-03-09 NOTE — Assessment & Plan Note (Signed)
Discussed. Notify if desires any further intervention.  ?

## 2022-03-10 LAB — BASIC METABOLIC PANEL
BUN: 9 mg/dL (ref 6–23)
CO2: 26 mEq/L (ref 19–32)
Calcium: 8.9 mg/dL (ref 8.4–10.5)
Chloride: 93 mEq/L — ABNORMAL LOW (ref 96–112)
Creatinine, Ser: 0.75 mg/dL (ref 0.40–1.50)
GFR: 104.14 mL/min (ref >60.00)
Glucose, Bld: 127 mg/dL — ABNORMAL HIGH (ref 70–99)
Potassium: 4.3 mEq/L (ref 3.5–5.1)
Sodium: 127 mEq/L — ABNORMAL LOW (ref 135–145)

## 2022-03-12 ENCOUNTER — Other Ambulatory Visit: Payer: Self-pay

## 2022-03-12 DIAGNOSIS — E871 Hypo-osmolality and hyponatremia: Secondary | ICD-10-CM

## 2022-03-16 ENCOUNTER — Telehealth: Payer: Self-pay | Admitting: Internal Medicine

## 2022-03-16 NOTE — Telephone Encounter (Signed)
Just needs a sodium check.  Order is in.   ?

## 2022-03-16 NOTE — Telephone Encounter (Signed)
Patient has a lab appt 03/18/2022 for repeat BMET, there are no orders in. ?

## 2022-03-17 DIAGNOSIS — L97529 Non-pressure chronic ulcer of other part of left foot with unspecified severity: Secondary | ICD-10-CM | POA: Diagnosis not present

## 2022-03-17 DIAGNOSIS — Z9641 Presence of insulin pump (external) (internal): Secondary | ICD-10-CM | POA: Diagnosis not present

## 2022-03-17 DIAGNOSIS — I10 Essential (primary) hypertension: Secondary | ICD-10-CM | POA: Diagnosis not present

## 2022-03-17 DIAGNOSIS — E10621 Type 1 diabetes mellitus with foot ulcer: Secondary | ICD-10-CM | POA: Diagnosis not present

## 2022-03-18 ENCOUNTER — Other Ambulatory Visit (INDEPENDENT_AMBULATORY_CARE_PROVIDER_SITE_OTHER): Payer: Medicare Other

## 2022-03-18 DIAGNOSIS — E871 Hypo-osmolality and hyponatremia: Secondary | ICD-10-CM | POA: Diagnosis not present

## 2022-03-19 LAB — SODIUM: Sodium: 130 mEq/L — ABNORMAL LOW (ref 135–145)

## 2022-03-20 ENCOUNTER — Other Ambulatory Visit: Payer: Self-pay | Admitting: Internal Medicine

## 2022-03-20 DIAGNOSIS — E871 Hypo-osmolality and hyponatremia: Secondary | ICD-10-CM

## 2022-03-20 NOTE — Progress Notes (Signed)
Order placed for f/u sodium.  ?

## 2022-03-23 ENCOUNTER — Encounter: Payer: Medicare Other | Attending: Physician Assistant | Admitting: Physician Assistant

## 2022-03-23 DIAGNOSIS — I5042 Chronic combined systolic (congestive) and diastolic (congestive) heart failure: Secondary | ICD-10-CM | POA: Insufficient documentation

## 2022-03-23 DIAGNOSIS — G8254 Quadriplegia, C5-C7 incomplete: Secondary | ICD-10-CM | POA: Diagnosis not present

## 2022-03-23 DIAGNOSIS — Y839 Surgical procedure, unspecified as the cause of abnormal reaction of the patient, or of later complication, without mention of misadventure at the time of the procedure: Secondary | ICD-10-CM | POA: Diagnosis not present

## 2022-03-23 DIAGNOSIS — L89153 Pressure ulcer of sacral region, stage 3: Secondary | ICD-10-CM | POA: Diagnosis not present

## 2022-03-23 DIAGNOSIS — I110 Hypertensive heart disease with heart failure: Secondary | ICD-10-CM | POA: Insufficient documentation

## 2022-03-23 DIAGNOSIS — T8189XA Other complications of procedures, not elsewhere classified, initial encounter: Secondary | ICD-10-CM | POA: Diagnosis not present

## 2022-03-23 DIAGNOSIS — E10621 Type 1 diabetes mellitus with foot ulcer: Secondary | ICD-10-CM | POA: Diagnosis not present

## 2022-03-23 DIAGNOSIS — L98492 Non-pressure chronic ulcer of skin of other sites with fat layer exposed: Secondary | ICD-10-CM | POA: Diagnosis not present

## 2022-03-23 DIAGNOSIS — L97429 Non-pressure chronic ulcer of left heel and midfoot with unspecified severity: Secondary | ICD-10-CM | POA: Insufficient documentation

## 2022-03-23 NOTE — Progress Notes (Addendum)
TERREL, NESHEIWAT ([id number]) ?Visit Report for 03/23/2022 ?Arrival Information Details ?Patient Name: [name], [name] ?Date of Service: 03/23/2022 2:45 PM ?Medical Record Number: [id number] ?Patient Account Number: [id number] ?Date of Birth/Sex: 11-01-1971 (51 y.o. M) ?Treating RN: Carlene Coria ?Primary Care Dailyn Reith: Einar Pheasant Other Clinician: ?Referring Sweet Jarvis: Einar Pheasant ?Treating Deandrew Hoecker/Extender: Jeri Cos ?Weeks in Treatment: 16 ?Visit Information History Since Last Visit ?All ordered tests and consults were completed: No ?Patient Arrived: Wheel Chair ?Added or deleted any medications: No ?Arrival Time: 15:01 ?Any new allergies or adverse reactions: No ?Accompanied By: self ?Had a fall or experienced change in No ?Transfer Assistance: None ?activities of daily living that may affect ?Patient Identification Verified: Yes ?risk of falls: ?Secondary Verification Process Completed: Yes ?Signs or symptoms of abuse/neglect since last visito No ?Patient Requires Transmission-Based Precautions: No ?Hospitalized since last visit: No ?Patient Has Alerts: No ?Implantable device outside of the clinic excluding No ?cellular tissue based products placed in the center ?since last visit: ?Has Dressing in Place as Prescribed: Yes ?Pain Present Now: Yes ?Electronic Signature(s) ?Signed: 03/24/2022 9:16:22 AM By: Carlene Coria RN ?Entered By: Carlene Coria on 03/23/2022 15:08:17 ?MICIAH, COVELLI ([id number]) ?-------------------------------------------------------------------------------- ?Clinic Level of Care Assessment Details ?Patient Name: BURNIE, THERIEN ?Date of Service: 03/23/2022 2:45 PM ?Medical Record Number: [id number] ?Patient Account Number: [id number] ?Date of Birth/Sex: 1970-12-31 (51 y.o. M) ?Treating RN: Carlene Coria ?Primary Care Ruhee Enck: Einar Pheasant Other Clinician: ?Referring Tahj Lindseth: Einar Pheasant ?Treating Jodi Criscuolo/Extender: Jeri Cos ?Weeks in Treatment: 16 ?Clinic Level of Care  Assessment Items ?TOOL 4 Quantity Score ?X - Use when only an EandM is performed on FOLLOW-UP visit 1 0 ?ASSESSMENTS - Nursing Assessment / Reassessment ?X - Reassessment of Co-morbidities (includes updates in patient status) 1 10 ?X- 1 5 ?Reassessment of Adherence to Treatment Plan ?ASSESSMENTS - Wound and Skin Assessment / Reassessment ?X - Simple Wound Assessment / Reassessment - one wound 1 5 ?[]  - 0 ?Complex Wound Assessment / Reassessment - multiple wounds ?[]  - 0 ?Dermatologic / Skin Assessment (not related to wound area) ?ASSESSMENTS - Focused Assessment ?[]  - Circumferential Edema Measurements - multi extremities 0 ?[]  - 0 ?Nutritional Assessment / Counseling / Intervention ?[]  - 0 ?Lower Extremity Assessment (monofilament, tuning fork, pulses) ?[]  - 0 ?Peripheral Arterial Disease Assessment (using hand held doppler) ?ASSESSMENTS - Ostomy and/or Continence Assessment and Care ?[]  - Incontinence Assessment and Management 0 ?[]  - 0 ?Ostomy Care Assessment and Management (repouching, etc.) ?PROCESS - Coordination of Care ?X - Simple Patient / Family Education for ongoing care 1 15 ?[]  - 0 ?Complex (extensive) Patient / Family Education for ongoing care ?[]  - 0 ?Staff obtains Consents, Records, Test Results / Process Orders ?[]  - 0 ?Staff telephones HHA, Nursing Homes / Clarify orders / etc ?[]  - 0 ?Routine Transfer to another Facility (non-emergent condition) ?[]  - 0 ?Routine Hospital Admission (non-emergent condition) ?[]  - 0 ?New Admissions / Biomedical engineer / Ordering NPWT, Apligraf, etc. ?[]  - 0 ?Emergency Hospital Admission (emergent condition) ?X- 1 10 ?Simple Discharge Coordination ?[]  - 0 ?Complex (extensive) Discharge Coordination ?PROCESS - Special Needs ?[]  - Pediatric / Minor Patient Management 0 ?[]  - 0 ?Isolation Patient Management ?[]  - 0 ?Hearing / Language / Visual special needs ?[]  - 0 ?Assessment of Community assistance (transportation, D/C planning, etc.) ?[]  - 0 ?Additional  assistance / Altered mentation ?[]  - 0 ?Support Surface(s) Assessment (bed, cushion, seat, etc.) ?INTERVENTIONS - Wound Cleansing / Measurement ?JAYDENCE, ARNESEN ([id number]) ?X- 1 5 ?Simple Wound Cleansing -  one wound ?[]  - 0 ?Complex Wound Cleansing - multiple wounds ?X- 1 5 ?Wound Imaging (photographs - any number of wounds) ?[]  - 0 ?Wound Tracing (instead of photographs) ?X- 1 5 ?Simple Wound Measurement - one wound ?[]  - 0 ?Complex Wound Measurement - multiple wounds ?INTERVENTIONS - Wound Dressings ?X - Small Wound Dressing one or multiple wounds 1 10 ?[]  - 0 ?Medium Wound Dressing one or multiple wounds ?[]  - 0 ?Large Wound Dressing one or multiple wounds ?[]  - 0 ?Application of Medications - topical ?[]  - 0 ?Application of Medications - injection ?INTERVENTIONS - Miscellaneous ?[]  - External ear exam 0 ?[]  - 0 ?Specimen Collection (cultures, biopsies, blood, body fluids, etc.) ?[]  - 0 ?Specimen(s) / Culture(s) sent or taken to Lab for analysis ?[]  - 0 ?Patient Transfer (multiple staff / Civil Service fast streamer / Similar devices) ?[]  - 0 ?Simple Staple / Suture removal (25 or less) ?[]  - 0 ?Complex Staple / Suture removal (26 or more) ?[]  - 0 ?Hypo / Hyperglycemic Management (close monitor of Blood Glucose) ?[]  - 0 ?Ankle / Brachial Index (ABI) - do not check if billed separately ?X- 1 5 ?Vital Signs ?Has the patient been seen at the hospital within the last three years: Yes ?Total Score: 75 ?Level Of Care: New/Established - Level ?2 ?Electronic Signature(s) ?Signed: 03/24/2022 9:16:22 AM By: Carlene Coria RN ?Entered By: Carlene Coria on 03/23/2022 15:26:30 ?Ocallaghan, Lahoma Rocker ([id number]) ?-------------------------------------------------------------------------------- ?Encounter Discharge Information Details ?Patient Name: CASS, VANDERMEULEN ?Date of Service: 03/23/2022 2:45 PM ?Medical Record Number: [id number] ?Patient Account Number: [id number] ?Date of Birth/Sex: [birth year] (51 y.o. M) ?Treating RN: Carlene Coria ?Primary  Care Tron Flythe: Einar Pheasant Other Clinician: ?Referring Eliyah Bazzi: Einar Pheasant ?Treating Ailee Pates/Extender: Jeri Cos ?Weeks in Treatment: 16 ?Encounter Discharge Information Items ?Discharge Condition: Stable ?Ambulatory Status: Wheelchair ?Discharge Destination: Home ?Transportation: Private Auto ?Accompanied By: self ?Schedule Follow-up Appointment: Yes ?Clinical Summary of Care: Patient Declined ?Electronic Signature(s) ?Signed: 03/24/2022 9:16:22 AM By: Carlene Coria RN ?Entered By: Carlene Coria on 03/23/2022 15:32:31 ?DELBERT, DARLEY ([id number]) ?-------------------------------------------------------------------------------- ?Lower Extremity Assessment Details ?Patient Name: NASEEM, VARDEN ?Date of Service: 03/23/2022 2:45 PM ?Medical Record Number: [id number] ?Patient Account Number: [id number] ?Date of Birth/Sex: 1971/07/15 (51 y.o. M) ?Treating RN: Carlene Coria ?Primary Care Leon Goodnow: Einar Pheasant Other Clinician: ?Referring Rozlynn Lippold: Einar Pheasant ?Treating Robby Bulkley/Extender: Jeri Cos ?Weeks in Treatment: 16 ?Electronic Signature(s) ?Signed: 03/24/2022 9:16:22 AM By: Carlene Coria RN ?Entered By: Carlene Coria on 03/23/2022 15:19:40 ?KHALFANI, WEIDEMAN ([id number]) ?-------------------------------------------------------------------------------- ?Multi Wound Chart Details ?Patient Name: DAWOOD, SPITLER ?Date of Service: 03/23/2022 2:45 PM ?Medical Record Number: [id number] ?Patient Account Number: [id number] ?Date of Birth/Sex: 1971-03-18 (51 y.o. M) ?Treating RN: Carlene Coria ?Primary Care Olvin Rohr: Einar Pheasant Other Clinician: ?Referring Anakaren Campion: Einar Pheasant ?Treating Elijio Staples/Extender: Jeri Cos ?Weeks in Treatment: 16 ?Vital Signs ?Height(in): 72 ?Pulse(bpm): 53 ?Weight(lbs): 175 ?Blood Pressure(mmHg): 129/83 ?Body Mass Index(BMI): 23.7 ?Temperature(??F): 97.5 ?Respiratory Rate(breaths/min): 18 ?Photos: [N/A:N/A] ?Wound Location: Midline Sacrum Right Sacrum N/A ?Wounding Event:  Gradually Appeared Gradually Appeared N/A ?Primary Etiology: Pressure Ulcer Pressure Ulcer N/A ?Comorbid History: Congestive Heart Failure, Type I Congestive Heart Failure, Type I N/A ?Diabetes, History of pressure Diabe
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
## 2022-03-24 DIAGNOSIS — L89153 Pressure ulcer of sacral region, stage 3: Secondary | ICD-10-CM | POA: Diagnosis not present

## 2022-04-02 ENCOUNTER — Telehealth: Payer: Self-pay | Admitting: Internal Medicine

## 2022-04-02 DIAGNOSIS — L89153 Pressure ulcer of sacral region, stage 3: Secondary | ICD-10-CM | POA: Diagnosis not present

## 2022-04-02 NOTE — Telephone Encounter (Signed)
Copied from [place] [id number]. Topic: Medicare AWV ?>> Apr 02, 2022 10:13 AM Harris-Coley, Hannah Beat wrote: ?Reason for CRM: Left message for patient to schedule Annual Wellness Visit.  Please schedule with Nurse Health Advisor Denisa O'Brien-Blaney, LPN at [facility].  Please call [phone] ask for Juliann Pulse ?

## 2022-04-10 ENCOUNTER — Other Ambulatory Visit (INDEPENDENT_AMBULATORY_CARE_PROVIDER_SITE_OTHER): Payer: Medicare Other

## 2022-04-10 DIAGNOSIS — E871 Hypo-osmolality and hyponatremia: Secondary | ICD-10-CM | POA: Diagnosis not present

## 2022-04-10 NOTE — Addendum Note (Signed)
Addended by: Leeanne Rio on: 04/10/2022 02:21 PM ? ? Modules accepted: Orders ? ?

## 2022-04-11 LAB — SODIUM: Sodium: 129 mmol/L — ABNORMAL LOW (ref 134–144)

## 2022-04-17 ENCOUNTER — Other Ambulatory Visit: Payer: Self-pay | Admitting: Internal Medicine

## 2022-04-20 ENCOUNTER — Encounter: Payer: Medicare Other | Attending: Physician Assistant | Admitting: Physician Assistant

## 2022-04-20 DIAGNOSIS — L89153 Pressure ulcer of sacral region, stage 3: Secondary | ICD-10-CM | POA: Diagnosis not present

## 2022-04-20 DIAGNOSIS — I110 Hypertensive heart disease with heart failure: Secondary | ICD-10-CM | POA: Insufficient documentation

## 2022-04-20 DIAGNOSIS — I5042 Chronic combined systolic (congestive) and diastolic (congestive) heart failure: Secondary | ICD-10-CM | POA: Insufficient documentation

## 2022-04-20 DIAGNOSIS — G8220 Paraplegia, unspecified: Secondary | ICD-10-CM | POA: Diagnosis not present

## 2022-04-20 DIAGNOSIS — E1051 Type 1 diabetes mellitus with diabetic peripheral angiopathy without gangrene: Secondary | ICD-10-CM | POA: Insufficient documentation

## 2022-04-20 DIAGNOSIS — E10621 Type 1 diabetes mellitus with foot ulcer: Secondary | ICD-10-CM | POA: Insufficient documentation

## 2022-04-20 DIAGNOSIS — L98492 Non-pressure chronic ulcer of skin of other sites with fat layer exposed: Secondary | ICD-10-CM | POA: Diagnosis not present
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
## 2022-04-20 NOTE — Progress Notes (Addendum)
CHESTON, COURY ([id number]) ?Visit Report for 04/20/2022 ?Arrival Information Details ?Patient Name: [name], [name] ?Date of Service: 04/20/2022 2:45 PM ?Medical Record Number: [id number] ?Patient Account Number: [id number] ?Date of Birth/Sex: 10/29/1971 (51 y.o. M) ?Treating RN: Carlene Coria ?Primary Care Ramiya Delahunty: Einar Pheasant Other Clinician: ?Referring Rico Massar: Einar Pheasant ?Treating Raequan Vanschaick/Extender: Jeri Cos ?Weeks in Treatment: 20 ?Visit Information History Since Last Visit ?All ordered tests and consults were completed: No ?Patient Arrived: Ambulatory ?Added or deleted any medications: No ?Arrival Time: 15:35 ?Any new allergies or adverse reactions: No ?Accompanied By: self ?Had a fall or experienced change in No ?Transfer Assistance: None ?activities of daily living that may affect ?Patient Identification Verified: Yes ?risk of falls: ?Secondary Verification Process Completed: Yes ?Signs or symptoms of abuse/neglect since last visito No ?Patient Requires Transmission-Based Precautions: No ?Hospitalized since last visit: No ?Patient Has Alerts: No ?Implantable device outside of the clinic excluding No ?cellular tissue based products placed in the center ?since last visit: ?Has Dressing in Place as Prescribed: Yes ?Pain Present Now: Yes ?Electronic Signature(s) ?Signed: 04/21/2022 10:35:39 AM By: Carlene Coria RN ?Entered By: Carlene Coria on 04/20/2022 15:40:28 ?[name], [name] ([id number]) ?-------------------------------------------------------------------------------- ?Clinic Level of Care Assessment Details ?Patient Name: [name], [name] ?Date of Service: 04/20/2022 2:45 PM ?Medical Record Number: [id number] ?Patient Account Number: [id number] ?Date of Birth/Sex: Jan 10, 1971 (51 y.o. M) ?Treating RN: Carlene Coria ?Primary Care Dangelo Guzzetta: Einar Pheasant Other Clinician: ?Referring Ita Fritzsche: Einar Pheasant ?Treating Laelani Vasko/Extender: Jeri Cos ?Weeks in Treatment: 20 ?Clinic Level of Care  Assessment Items ?TOOL 4 Quantity Score ?X - Use when only an EandM is performed on FOLLOW-UP visit 1 0 ?ASSESSMENTS - Nursing Assessment / Reassessment ?X - Reassessment of Co-morbidities (includes updates in patient status) 1 10 ?X- 1 5 ?Reassessment of Adherence to Treatment Plan ?ASSESSMENTS - Wound and Skin Assessment / Reassessment ?X - Simple Wound Assessment / Reassessment - one wound 1 5 ?[]  - 0 ?Complex Wound Assessment / Reassessment - multiple wounds ?[]  - 0 ?Dermatologic / Skin Assessment (not related to wound area) ?ASSESSMENTS - Focused Assessment ?[]  - Circumferential Edema Measurements - multi extremities 0 ?[]  - 0 ?Nutritional Assessment / Counseling / Intervention ?[]  - 0 ?Lower Extremity Assessment (monofilament, tuning fork, pulses) ?[]  - 0 ?Peripheral Arterial Disease Assessment (using hand held doppler) ?ASSESSMENTS - Ostomy and/or Continence Assessment and Care ?[]  - Incontinence Assessment and Management 0 ?[]  - 0 ?Ostomy Care Assessment and Management (repouching, etc.) ?PROCESS - Coordination of Care ?X - Simple Patient / Family Education for ongoing care 1 15 ?[]  - 0 ?Complex (extensive) Patient / Family Education for ongoing care ?X- 1 10 ?Staff obtains Consents, Records, Test Results / Process Orders ?[]  - 0 ?Staff telephones HHA, Nursing Homes / Clarify orders / etc ?[]  - 0 ?Routine Transfer to another Facility (non-emergent condition) ?[]  - 0 ?Routine Hospital Admission (non-emergent condition) ?[]  - 0 ?New Admissions / Biomedical engineer / Ordering NPWT, Apligraf, etc. ?[]  - 0 ?Emergency Hospital Admission (emergent condition) ?X- 1 10 ?Simple Discharge Coordination ?[]  - 0 ?Complex (extensive) Discharge Coordination ?PROCESS - Special Needs ?[]  - Pediatric / Minor Patient Management 0 ?[]  - 0 ?Isolation Patient Management ?[]  - 0 ?Hearing / Language / Visual special needs ?[]  - 0 ?Assessment of Community assistance (transportation, D/C planning, etc.) ?[]  - 0 ?Additional  assistance / Altered mentation ?[]  - 0 ?Support Surface(s) Assessment (bed, cushion, seat, etc.) ?INTERVENTIONS - Wound Cleansing / Measurement ?[name], [name] ([id number]) ?X- 1 5 ?Simple Wound Cleansing -  one wound ?[]  - 0 ?Complex Wound Cleansing - multiple wounds ?X- 1 5 ?Wound Imaging (photographs - any number of wounds) ?[]  - 0 ?Wound Tracing (instead of photographs) ?X- 1 5 ?Simple Wound Measurement - one wound ?[]  - 0 ?Complex Wound Measurement - multiple wounds ?INTERVENTIONS - Wound Dressings ?X - Small Wound Dressing one or multiple wounds 1 10 ?[]  - 0 ?Medium Wound Dressing one or multiple wounds ?[]  - 0 ?Large Wound Dressing one or multiple wounds ?[]  - 0 ?Application of Medications - topical ?[]  - 0 ?Application of Medications - injection ?INTERVENTIONS - Miscellaneous ?[]  - External ear exam 0 ?[]  - 0 ?Specimen Collection (cultures, biopsies, blood, body fluids, etc.) ?[]  - 0 ?Specimen(s) / Culture(s) sent or taken to Lab for analysis ?[]  - 0 ?Patient Transfer (multiple staff / Civil Service fast streamer / Similar devices) ?[]  - 0 ?Simple Staple / Suture removal (25 or less) ?[]  - 0 ?Complex Staple / Suture removal (26 or more) ?[]  - 0 ?Hypo / Hyperglycemic Management (close monitor of Blood Glucose) ?[]  - 0 ?Ankle / Brachial Index (ABI) - do not check if billed separately ?X- 1 5 ?Vital Signs ?Has the patient been seen at the hospital within the last three years: Yes ?Total Score: 85 ?Level Of Care: New/Established - Level ?3 ?Electronic Signature(s) ?Signed: 04/21/2022 10:35:39 AM By: Carlene Coria RN ?Entered By: Carlene Coria on 04/20/2022 16:14:10 ?[name], [name] ([id number]) ?-------------------------------------------------------------------------------- ?Encounter Discharge Information Details ?Patient Name: [name], [name] ?Date of Service: 04/20/2022 2:45 PM ?Medical Record Number: [id number] ?Patient Account Number: [id number] ?Date of Birth/Sex: 1971-09-28 (51 y.o. M) ?Treating RN: Carlene Coria ?Primary  Care Arlyne Brandes: Einar Pheasant Other Clinician: ?Referring Krishna Heuer: Einar Pheasant ?Treating Aiyanna Awtrey/Extender: Jeri Cos ?Weeks in Treatment: 20 ?Encounter Discharge Information Items ?Discharge Condition: Stable ?Ambulatory Status: Wheelchair ?Discharge Destination: Home ?Transportation: Private Auto ?Accompanied By: self ?Schedule Follow-up Appointment: Yes ?Clinical Summary of Care: Patient Declined ?Electronic Signature(s) ?Signed: 04/21/2022 10:35:39 AM By: Carlene Coria RN ?Entered By: Carlene Coria on 04/20/2022 16:14:58 ?[name], [name] ([id number]) ?-------------------------------------------------------------------------------- ?Lower Extremity Assessment Details ?Patient Name: [name], [name] ?Date of Service: 04/20/2022 2:45 PM ?Medical Record Number: [id number] ?Patient Account Number: [id number] ?Date of Birth/Sex: [birth year] (51 y.o. M) ?Treating RN: Carlene Coria ?Primary Care Mylo Driskill: Einar Pheasant Other Clinician: ?Referring Husam Hohn: Einar Pheasant ?Treating Naiomi Musto/Extender: Jeri Cos ?Weeks in Treatment: 20 ?Electronic Signature(s) ?Signed: 04/21/2022 10:35:39 AM By: Carlene Coria RN ?Entered By: Carlene Coria on 04/20/2022 15:46:45 ?[name], [name] ([id number]) ?-------------------------------------------------------------------------------- ?Multi Wound Chart Details ?Patient Name: [name], [name] ?Date of Service: 04/20/2022 2:45 PM ?Medical Record Number: [id number] ?Patient Account Number: [id number] ?Date of Birth/Sex: Jan 27, 1971 (51 y.o. M) ?Treating RN: Carlene Coria ?Primary Care Darci Lykins: Einar Pheasant Other Clinician: ?Referring Teletha Petrea: Einar Pheasant ?Treating Akiel Fennell/Extender: Jeri Cos ?Weeks in Treatment: 20 ?Vital Signs ?Height(in): 72 ?Pulse(bpm): 65 ?Weight(lbs): 175 ?Blood Pressure(mmHg): 191/100 ?Body Mass Index(BMI): 23.7 ?Temperature(??F): 97.7 ?Respiratory Rate(breaths/min): 18 ?Photos: [9:No Photos] [N/A:N/A] ?Wound Location: Midline Sacrum Right Sacrum  N/A ?Wounding Event: Gradually Appeared Gradually Appeared N/A ?Primary Etiology: Pressure Ulcer Pressure Ulcer N/A ?Comorbid History: Congestive Heart Failure, Type I Congestive Heart Failure, Type I N/A ?Diabetes, Histor

## 2022-04-21 DIAGNOSIS — L89153 Pressure ulcer of sacral region, stage 3: Secondary | ICD-10-CM | POA: Diagnosis not present

## 2022-04-28 ENCOUNTER — Other Ambulatory Visit: Payer: Self-pay | Admitting: Internal Medicine

## 2022-04-29 DIAGNOSIS — L89153 Pressure ulcer of sacral region, stage 3: Secondary | ICD-10-CM | POA: Diagnosis not present

## 2022-05-01 ENCOUNTER — Other Ambulatory Visit: Payer: Self-pay

## 2022-05-07 ENCOUNTER — Encounter: Payer: Self-pay | Admitting: Gastroenterology

## 2022-05-07 ENCOUNTER — Ambulatory Visit (INDEPENDENT_AMBULATORY_CARE_PROVIDER_SITE_OTHER): Payer: Medicare Other | Admitting: Gastroenterology

## 2022-05-07 VITALS — BP 135/88 | HR 61 | Temp 98.1°F

## 2022-05-07 DIAGNOSIS — R197 Diarrhea, unspecified: Secondary | ICD-10-CM | POA: Diagnosis not present

## 2022-05-07 DIAGNOSIS — R194 Change in bowel habit: Secondary | ICD-10-CM
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
## 2022-05-12 ENCOUNTER — Telehealth: Payer: Self-pay | Admitting: Cardiovascular Disease

## 2022-05-12 NOTE — Telephone Encounter (Signed)
3 attempts to schedule fu appt from recall list.   Deleting recall.   

## 2022-05-13 ENCOUNTER — Telehealth: Payer: Self-pay

## 2022-05-13 ENCOUNTER — Other Ambulatory Visit: Payer: Self-pay

## 2022-05-13 DIAGNOSIS — R194 Change in bowel habit: Secondary | ICD-10-CM

## 2022-05-13 MED ORDER — NA SULFATE-K SULFATE-MG SULF 17.5-3.13-1.6 GM/177ML PO SOLN: 1.0000 | Freq: Once | ORAL | 0 refills | Status: AC

## 2022-05-13 NOTE — Telephone Encounter (Signed)
Clearance faxed to PCP Dr Nicki Reaper

## 2022-05-18 ENCOUNTER — Encounter: Payer: Medicare Other | Attending: Physician Assistant | Admitting: Physician Assistant

## 2022-05-18 ENCOUNTER — Other Ambulatory Visit
Admission: RE | Admit: 2022-05-18 | Discharge: 2022-05-18 | Disposition: A | Discharge status: Home / Self Care | Payer: Medicare Other | Source: Ambulatory Visit | Attending: Physician Assistant | Admitting: Physician Assistant

## 2022-05-18 DIAGNOSIS — Z981 Arthrodesis status: Secondary | ICD-10-CM | POA: Diagnosis not present

## 2022-05-18 DIAGNOSIS — I5042 Chronic combined systolic (congestive) and diastolic (congestive) heart failure: Secondary | ICD-10-CM | POA: Insufficient documentation

## 2022-05-18 DIAGNOSIS — I110 Hypertensive heart disease with heart failure: Secondary | ICD-10-CM | POA: Diagnosis not present

## 2022-05-18 DIAGNOSIS — B999 Unspecified infectious disease: Secondary | ICD-10-CM | POA: Diagnosis present

## 2022-05-18 DIAGNOSIS — E10621 Type 1 diabetes mellitus with foot ulcer: Secondary | ICD-10-CM | POA: Insufficient documentation

## 2022-05-18 DIAGNOSIS — E1051 Type 1 diabetes mellitus with diabetic peripheral angiopathy without gangrene: Secondary | ICD-10-CM | POA: Diagnosis not present

## 2022-05-18 DIAGNOSIS — L89153 Pressure ulcer of sacral region, stage 3: Secondary | ICD-10-CM | POA: Insufficient documentation

## 2022-05-18 DIAGNOSIS — G8220 Paraplegia, unspecified: Secondary | ICD-10-CM | POA: Insufficient documentation
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
## 2022-05-18 NOTE — Progress Notes (Addendum)
KOLSTON, LACOUNT ([id number]) Visit Report for 05/18/2022 Arrival Information Details Patient Name: [name], [name]. Date of Service: 05/18/2022 2:15 PM Medical Record Number: [id number] Patient Account Number: [id number] Date of Birth/Sex: 06/30/1971 (51 y.o. M) Treating RN: Levora Dredge Primary Care Nealie Mchatton: Einar Pheasant Other Clinician: Referring Ariston Grandison: Einar Pheasant Treating Kolette Vey/Extender: Skipper Cliche in Treatment: 24 Visit Information History Since Last Visit Added or deleted any medications: No Patient Arrived: Wheel Chair Any new allergies or adverse reactions: No Arrival Time: 14:25 Had a fall or experienced change in No Accompanied By: self activities of daily living that may affect Transfer Assistance: Transfer Board risk of falls: Patient Identification Verified: Yes Hospitalized since last visit: No Secondary Verification Process Completed: Yes Has Dressing in Place as Prescribed: Yes Patient Requires Transmission-Based Precautions: No Pain Present Now: Yes Patient Has Alerts: No Electronic Signature(s) Signed: 05/18/2022 2:54:29 PM By: Levora Dredge Entered By: Levora Dredge on 05/18/2022 14:27:21 Mccarthy, Lahoma Rocker ([id number]) -------------------------------------------------------------------------------- Clinic Level of Care Assessment Details Patient Name: [name]. Date of Service: 05/18/2022 2:15 PM Medical Record Number: [id number] Patient Account Number: [id number] Date of Birth/Sex: 29-Dec-1970 (51 y.o. M) Treating RN: Levora Dredge Primary Care Jolin Benavides: Einar Pheasant Other Clinician: Referring Crissy Mccreadie: Einar Pheasant Treating Decorey Wahlert/Extender: Skipper Cliche in Treatment: 24 Clinic Level of Care Assessment Items TOOL 4 Quantity Score []  - Use when only an EandM is performed on FOLLOW-UP visit 0 ASSESSMENTS - Nursing Assessment / Reassessment X - Reassessment of Co-morbidities (includes updates in patient  status) 1 10 []  - 0 Reassessment of Adherence to Treatment Plan ASSESSMENTS - Wound and Skin Assessment / Reassessment X - Simple Wound Assessment / Reassessment - one wound 1 5 []  - 0 Complex Wound Assessment / Reassessment - multiple wounds []  - 0 Dermatologic / Skin Assessment (not related to wound area) ASSESSMENTS - Focused Assessment []  - Circumferential Edema Measurements - multi extremities 0 []  - 0 Nutritional Assessment / Counseling / Intervention []  - 0 Lower Extremity Assessment (monofilament, tuning fork, pulses) []  - 0 Peripheral Arterial Disease Assessment (using hand held doppler) ASSESSMENTS - Ostomy and/or Continence Assessment and Care []  - Incontinence Assessment and Management 0 []  - 0 Ostomy Care Assessment and Management (repouching, etc.) PROCESS - Coordination of Care X - Simple Patient / Family Education for ongoing care 1 15 []  - 0 Complex (extensive) Patient / Family Education for ongoing care []  - 0 Staff obtains Programmer, systems, Records, Test Results / Process Orders []  - 0 Staff telephones HHA, Nursing Homes / Clarify orders / etc []  - 0 Routine Transfer to another Facility (non-emergent condition) []  - 0 Routine Hospital Admission (non-emergent condition) []  - 0 New Admissions / Biomedical engineer / Ordering NPWT, Apligraf, etc. []  - 0 Emergency Hospital Admission (emergent condition) X- 1 10 Simple Discharge Coordination []  - 0 Complex (extensive) Discharge Coordination PROCESS - Special Needs []  - Pediatric / Minor Patient Management 0 []  - 0 Isolation Patient Management []  - 0 Hearing / Language / Visual special needs []  - 0 Assessment of Community assistance (transportation, D/C planning, etc.) []  - 0 Additional assistance / Altered mentation []  - 0 Support Surface(s) Assessment (bed, cushion, seat, etc.) INTERVENTIONS - Wound Cleansing / Measurement Daino, Carsin K. ([id number]) X- 1 5 Simple Wound Cleansing - one wound []  -  0 Complex Wound Cleansing - multiple wounds X- 1 5 Wound Imaging (photographs - any number of wounds) []  - 0 Wound Tracing (instead of photographs) X- 1 5 Simple Wound Measurement -  one wound []  - 0 Complex Wound Measurement - multiple wounds INTERVENTIONS - Wound Dressings X - Small Wound Dressing one or multiple wounds 1 10 []  - 0 Medium Wound Dressing one or multiple wounds []  - 0 Large Wound Dressing one or multiple wounds []  - 0 Application of Medications - topical []  - 0 Application of Medications - injection INTERVENTIONS - Miscellaneous []  - External ear exam 0 []  - 0 Specimen Collection (cultures, biopsies, blood, body fluids, etc.) []  - 0 Specimen(s) / Culture(s) sent or taken to Lab for analysis X- 1 10 Patient Transfer (multiple staff / Harrel Lemon Lift / Similar devices) []  - 0 Simple Staple / Suture removal (25 or less) []  - 0 Complex Staple / Suture removal (26 or more) []  - 0 Hypo / Hyperglycemic Management (close monitor of Blood Glucose) []  - 0 Ankle / Brachial Index (ABI) - do not check if billed separately X- 1 5 Vital Signs Has the patient been seen at the hospital within the last three years: Yes Total Score: 80 Level Of Care: New/Established - Level 3 Electronic Signature(s) Signed: 05/18/2022 2:54:29 PM By: Levora Dredge Entered By: Levora Dredge on 05/18/2022 14:52:13 Bakke, Lahoma Rocker ([id number]) -------------------------------------------------------------------------------- Encounter Discharge Information Details Patient Name: [name]. Date of Service: 05/18/2022 2:15 PM Medical Record Number: [id number] Patient Account Number: [id number] Date of Birth/Sex: [birth year] (51 y.o. M) Treating RN: Levora Dredge Primary Care Johnie Makki: Einar Pheasant Other Clinician: Referring Markiah Janeway: Einar Pheasant Treating Kataleya Zaugg/Extender: Skipper Cliche in Treatment: 24 Encounter Discharge Information Items Post Procedure Vitals Discharge  Condition: Stable Temperature (F): 97.6 Ambulatory Status: Wheelchair Pulse (bpm): 60 Discharge Destination: Home Respiratory Rate (breaths/min): 18 Transportation: Private Auto Blood Pressure (mmHg): 203/123 Accompanied By: self Schedule Follow-up Appointment: Yes Clinical Summary of Care: Notes PA Stone made aware of increased BP, patient asymptomatic, pt states seeing heart doctor soon and will discus with him. Electronic Signature(s) Signed: 05/18/2022 2:54:29 PM By: Levora Dredge Entered By: Levora Dredge on 05/18/2022 14:54:06 Briney, Lahoma Rocker ([id number]) -------------------------------------------------------------------------------- Lower Extremity Assessment Details Patient Name: [name]. Date of Service: 05/18/2022 2:15 PM Medical Record Number: [id number] Patient Account Number: [id number] Date of Birth/Sex: May 30, 1971 (51 y.o. M) Treating RN: Levora Dredge Primary Care Aerabella Galasso: Einar Pheasant Other Clinician: Referring Shanece Cochrane: Einar Pheasant Treating Preciosa Bundrick/Extender: Jeri Cos Weeks in Treatment: 24 Electronic Signature(s) Signed: 05/18/2022 2:54:29 PM By: Levora Dredge Entered By: Levora Dredge on 05/18/2022 14:35:35 Lape, Lahoma Rocker ([id number]) -------------------------------------------------------------------------------- Multi Wound Chart Details Patient Name: [name]. Date of Service: 05/18/2022 2:15 PM Medical Record Number: [id number] Patient Account Number: [id number] Date of Birth/Sex: Apr 26, 1971 (51 y.o. M) Treating RN: Levora Dredge Primary Care Mateya Torti: Einar Pheasant Other Clinician: Referring Grayden Burley: Einar Pheasant Treating Imogean Ciampa/Extender: Skipper Cliche in Treatment: 24 Vital Signs Height(in): 72 Pulse(bpm): 60 Weight(lbs): 175 Blood Pressure(mmHg): 203/123 Body Mass Index(BMI): 23.7 Temperature(F): 97.6 Respiratory Rate(breaths/min): 18 Photos: [N/A:N/A] Wound Location: Midline Sacrum N/A  N/A Wounding Event: Gradually Appeared N/A N/A Primary Etiology: Pressure Ulcer N/A N/A Comorbid History: Congestive Heart Failure, Type I N/A N/A Diabetes, History of pressure wounds, Paraplegia Date Acquired: 05/07/2021 N/A N/A Weeks of Treatment: 24 N/A N/A Wound Status: Open N/A N/A Wound Recurrence: No N/A N/A Measurements L x W x D (cm) 2x1x1 N/A N/A Area (cm) : 1.571 N/A N/A Volume (cm) : 1.571 N/A N/A % Reduction in Area: 71.40% N/A N/A % Reduction in Volume: 28.60% N/A N/A Classification: Category/Stage III N/A N/A Exudate Amount: Medium N/A N/A Exudate Type: Serosanguineous  N/A N/A Exudate Color: red, brown N/A N/A Wound Margin: Thickened N/A N/A Granulation Amount: Large (67-100%) N/A N/A Granulation Quality: Red N/A N/A Necrotic Amount: None Present (0%) N/A N/A Exposed Structures: Fat Layer (Subcutaneous Tissue): N/A N/A Yes Fascia: No Tendon: No Muscle: No Joint: No Bone: No Epithelialization: None N/A N/A Treatment Notes Electronic Signature(s) Signed: 05/18/2022 2:54:29 PM By: Levora Dredge Entered By: Levora Dredge on 05/18/2022 14:39:15 Phillis, Lahoma Rocker ([id number]) -------------------------------------------------------------------------------- [place] Details Patient Name: [name]. Date of Service: 05/18/2022 2:15 PM Medical Record Number: [id number] Patient Account Number: [id number] Date of Birth/Sex: [birth year] (51 y.o. M) Treating RN: Levora Dredge Primary Care Ewell Benassi: Einar Pheasant Other Clinician: Referring Jeffifer Rabold: Einar Pheasant Treating Trinna Kunst/Extender: Skipper Cliche in Treatment: 24 Active Inactive Wound/Skin Impairment Nursing Diagnoses: Knowledge deficit related to ulceration/compromised skin integrity Goals: Patient/caregiver will verbalize understanding of skin care regimen Date Initiated: 11/27/2021 Target Resolution Date: 12/28/2021 Goal Status: Active Ulcer/skin breakdown will have  a volume reduction of 30% by week 4 Date Initiated: 11/27/2021 Target Resolution Date: 01/28/2022 Goal Status: Active Ulcer/skin breakdown will have a volume reduction of 50% by week 8 Date Initiated: 11/27/2021 Target Resolution Date: 02/25/2022 Goal Status: Active Ulcer/skin breakdown will have a volume reduction of 80% by week 12 Date Initiated: 11/27/2021 Target Resolution Date: 03/28/2022 Goal Status: Active Ulcer/skin breakdown will heal within 14 weeks Date Initiated: 11/27/2021 Target Resolution Date: 04/27/2022 Goal Status: Active Interventions: Assess patient/caregiver ability to obtain necessary supplies Assess patient/caregiver ability to perform ulcer/skin care regimen upon admission and as needed Assess ulceration(s) every visit Notes: Electronic Signature(s) Signed: 05/18/2022 2:54:29 PM By: Levora Dredge Entered By: Levora Dredge on 05/18/2022 14:39:03 Bruss, Lahoma Rocker ([id number]) -------------------------------------------------------------------------------- Pain Assessment Details Patient Name: [name]. Date of Service: 05/18/2022 2:15 PM Medical Record Number: [id number] Patient Account Number: [id number] Date of Birth/Sex: 1971-05-03 (51 y.o. M) Treating RN: Levora Dredge Primary Care Jazzmin Newbold: Einar Pheasant Other Clinician: Referring Cresencia Asmus: Einar Pheasant Treating Helayna Dun/Extender: Skipper Cliche in Treatment: 24 Active Problems Location of Pain Severity and Description of Pain Patient Has Paino Yes Site Locations Rate the pain. Current Pain Level: 3 Pain Management and Medication Current Pain Management: Notes pt states pain in stomach and bottom Electronic Signature(s) Signed: 05/18/2022 2:54:29 PM By: Levora Dredge Entered By: Levora Dredge on 05/18/2022 14:28:18 Ruperto, Lahoma Rocker ([id number]) -------------------------------------------------------------------------------- Patient/Caregiver Education Details Patient  Name: [name]. Date of Service: 05/18/2022 2:15 PM Medical Record Number: [id number] Patient Account Number: [id number] Date of Birth/Gender: 1971-02-13 (51 y.o. M) Treating RN: Levora Dredge Primary Care Physician: Einar Pheasant Other Clinician: Referring Physician: Einar Pheasant Treating Physician/Extender: Skipper Cliche in Treatment: 24 Education Assessment Education Provided To: Patient Education Topics Provided Wound/Skin Impairment: Handouts: Caring for Your Ulcer Methods: Explain/Verbal Responses: State content correctly Electronic Signature(s) Signed: 05/18/2022 2:54:29 PM By: Levora Dredge Entered By: Levora Dredge on 05/18/2022 14:52:38 Ringley, Lahoma Rocker ([id number]) -------------------------------------------------------------------------------- Wound Assessment Details Patient Name: [name]. Date of Service: 05/18/2022 2:15 PM Medical Record Number: [id number] Patient Account Number: [id number] Date of Birth/Sex: 06/14/1971 (51 y.o. M) Treating RN: Levora Dredge Primary Care Janice Seales: Einar Pheasant Other Clinician: Referring Janes Colegrove: Einar Pheasant Treating Ulysees Robarts/Extender: Jeri Cos Weeks in Treatment: 24 Wound Status Wound Number: 8 Primary Pressure Ulcer Etiology: Wound Location: Midline Sacrum Wound Status: Open Wounding Event: Gradually Appeared Comorbid Congestive Heart Failure, Type I Diabetes, History of Date Acquired: 05/07/2021 History: pressure wounds, Paraplegia Weeks Of Treatment: 24 Clustered Wound: No Photos Wound Measurements Length: (cm)  2 Width: (cm) 1 Depth: (cm) 1 Area: (cm) 1.571 Volume: (cm) 1.571 % Reduction in Area: 71.4% % Reduction in Volume: 28.6% Epithelialization: None Tunneling: No Undermining: No Wound Description Classification: Category/Stage III Wound Margin: Thickened Exudate Amount: Medium Exudate Type: Serosanguineous Exudate Color: red, brown Foul Odor After Cleansing:  No Slough/Fibrino No Wound Bed Granulation Amount: Large (67-100%) Exposed Structure Granulation Quality: Red Fascia Exposed: No Necrotic Amount: None Present (0%) Fat Layer (Subcutaneous Tissue) Exposed: Yes Tendon Exposed: No Muscle Exposed: No Joint Exposed: No Bone Exposed: No Treatment Notes Wound #8 (Sacrum) Wound Laterality: Midline Cleanser [place] Ancillary Kit - 15 Day Supply Discharge Instruction: Use supplies as instructed; Kit contains: (15) Saline Bullets; (15) 3x3 Gauze; 15 pr Gloves Soap and Water Charlet, EYOEL THROGMORTON ([id number]) Discharge Instruction: Gently cleanse wound with antibacterial soap, rinse and pat dry prior to dressing wounds Peri-Wound Care Topical Primary Dressing Silvercel Small 2x2 (in/in) Discharge Instruction: Apply Silvercel Small 2x2 (in/in) as instructed Secondary Dressing (SILICONE BORDER) Zetuvit Plus SILICONE BORDER Dressing 4x4 (in/in) Secured With Compression Wrap Compression Stockings Add-Ons Electronic Signature(s) Signed: 05/18/2022 2:54:29 PM By: Levora Dredge Entered By: Levora Dredge on 05/18/2022 14:35:23 Lienau, Lahoma Rocker ([id number]) -------------------------------------------------------------------------------- Vitals Details Patient Name: [name]. Date of Service: 05/18/2022 2:15 PM Medical Record Number: [id number] Patient Account Number: [id number] Date of Birth/Sex: [birth year] (51 y.o. M) Treating RN: Levora Dredge Primary Care Gerson Fauth: Einar Pheasant Other Clinician: Referring Vivaan Helseth: Einar Pheasant Treating Kayleigh Broadwell/Extender: Skipper Cliche in Treatment: 24 Vital Signs Time Taken: 14:20 Temperature (F): 97.6 Height (in): 72 Pulse (bpm): 60 Weight (lbs): 175 Respiratory Rate (breaths/min): 18 Body Mass Index (BMI): 23.7 Blood Pressure (mmHg): 203/123 Reference Range: 80 - 120 mg / dl Electronic Signature(s) Signed: 05/18/2022 2:54:29 PM By: Levora Dredge Entered By: Levora Dredge on  05/18/2022 14:27:45

## 2022-05-19 DIAGNOSIS — S31809A Unspecified open wound of unspecified buttock, initial encounter: Secondary | ICD-10-CM | POA: Diagnosis not present
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
## 2022-05-20 ENCOUNTER — Ambulatory Visit: Payer: Medicare Other | Admitting: Cardiovascular Disease

## 2022-05-20 ENCOUNTER — Telehealth: Payer: Self-pay

## 2022-05-20 ENCOUNTER — Encounter: Payer: Self-pay | Admitting: Cardiovascular Disease

## 2022-05-20 VITALS — BP 110/78 | HR 59 | Ht 72.0 in | Wt 160.0 lb

## 2022-05-20 DIAGNOSIS — G903 Multi-system degeneration of the autonomic nervous system: Secondary | ICD-10-CM | POA: Diagnosis not present

## 2022-05-20 DIAGNOSIS — I639 Cerebral infarction, unspecified: Secondary | ICD-10-CM

## 2022-05-20 DIAGNOSIS — G904 Autonomic dysreflexia: Secondary | ICD-10-CM

## 2022-05-20 DIAGNOSIS — I5032 Chronic diastolic (congestive) heart failure: Secondary | ICD-10-CM

## 2022-05-20 DIAGNOSIS — R600 Localized edema: Secondary | ICD-10-CM | POA: Diagnosis not present

## 2022-05-20 DIAGNOSIS — I63443 Cerebral infarction due to embolism of bilateral cerebellar arteries: Secondary | ICD-10-CM | POA: Diagnosis not present

## 2022-05-20 DIAGNOSIS — Z959 Presence of cardiac and vascular implant and graft, unspecified: Secondary | ICD-10-CM | POA: Diagnosis not present

## 2022-05-20 DIAGNOSIS — R0601 Orthopnea: Secondary | ICD-10-CM

## 2022-05-20 MED ORDER — MIDODRINE HCL 5 MG PO TABS: 5.0000 mg | ORAL_TABLET | Freq: Three times a day (TID) | ORAL | 1 refills | Status: DC | PRN

## 2022-05-20 MED ORDER — HYDRALAZINE HCL 25 MG PO TABS: 25.0000 mg | ORAL_TABLET | Freq: Three times a day (TID) | ORAL | 1 refills | Status: DC | PRN

## 2022-05-20 NOTE — Telephone Encounter (Signed)
LM FOR PT TO [facility] : NEEDS PRE-SURG APPOINTMENT W/ DR Nicki Reaper FOR COLONOSCOPY  PROCEDURE SCHEDULED FOR 8/1

## 2022-05-20 NOTE — Patient Instructions (Addendum)
Medication Instructions:  Midodrine 5 mg up to three times a day as needed   Hydralazine 25 mg up to three times a day as needed for high pressure  If you need a refill on your cardiac medications before your next appointment, please call your pharmacy.   Lab work: No new labs needed  Testing/Procedures: No new testing needed  Follow-Up: At [facility], you and your health needs are our priority.  As part of our continuing mission to provide you with exceptional heart care, we have created designated Provider Care Teams.  These Care Teams include your primary Cardiologist (physician) and Advanced Practice Providers (APPs -  Physician Assistants and Nurse Practitioners) who all work together to provide you with the care you need, when you need it.  You will need a follow up appointment in 12 months  Providers on your designated Care Team:   Murray Hodgkins, NP Christell Faith, PA-C Cadence Kathlen Mody, [place]  COVID-19 Vaccine Information can be found at: [email or web address] For questions related to vaccine distribution or appointments, please email [email or web address] or call [phone].

## 2022-05-21 ENCOUNTER — Ambulatory Visit (INDEPENDENT_AMBULATORY_CARE_PROVIDER_SITE_OTHER): Payer: Medicare Other

## 2022-05-21 VITALS — Ht 72.0 in | Wt 160.0 lb

## 2022-05-21 DIAGNOSIS — Z0000 Encounter for general adult medical examination without abnormal findings: Secondary | ICD-10-CM

## 2022-05-21 LAB — AEROBIC CULTURE W GRAM STAIN (SUPERFICIAL SPECIMEN): Gram Stain: NONE SEEN
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
## 2022-05-21 NOTE — Patient Instructions (Addendum)
  Mr. [name] , Thank you for taking time to come for your Medicare Wellness Visit. I appreciate your ongoing commitment to your health goals. Please review the following plan we discussed and let me know if I can assist you in the future.   These are the goals we discussed:  Goals      Follow up with PCP     As needed        This is a list of the screening recommended for you and due dates:  Health Maintenance  Topic Date Due   COVID-19 Vaccine (3 - Moderna risk series) 06/06/2022*   Eye exam for diabetics  08/07/2022*   Hepatitis C Screening: USPSTF Recommendation to screen - Ages 18-79 yo.  08/07/2022*   Zoster (Shingles) Vaccine (1 of 2) 08/21/2022*   Tetanus Vaccine  05/22/2023*   Flu Shot  07/07/2022   Hemoglobin A1C  09/03/2022   Complete foot exam   02/20/2023   Colon Cancer Screening  05/03/2028   HIV Screening  Completed   HPV Vaccine  Aged Out  *Topic was postponed. The date shown is not the original due date.

## 2022-05-22 ENCOUNTER — Other Ambulatory Visit: Payer: Self-pay | Admitting: Internal Medicine

## 2022-05-25 ENCOUNTER — Other Ambulatory Visit: Payer: Self-pay | Admitting: Internal Medicine

## 2022-05-26 ENCOUNTER — Telehealth: Payer: Self-pay | Admitting: Cardiovascular Disease

## 2022-05-26 DIAGNOSIS — S31809A Unspecified open wound of unspecified buttock, initial encounter: Secondary | ICD-10-CM | POA: Diagnosis not present

## 2022-05-26 NOTE — Telephone Encounter (Signed)
Dr. Rockey Situ You saw this patient on 05/20/22 for follow up. We are asked for risk evaluation for colonoscopy. Can you please make recommendations on proceeding with colonoscopy?

## 2022-05-26 NOTE — Telephone Encounter (Signed)
   Pre-operative Risk Assessment    Patient Name: [name]  DOB: 1971/09/18 MRN: [id number]      Request for Surgical Clearance    Procedure:   Colonoscopy   Date of Surgery:  Clearance 07/07/22                                 Surgeon:  not indicated  Surgeon's Group or Practice Name:  [facility]  Phone number:  [phone] Fax number:  [phone]   Type of Clearance Requested:   - Medical    Type of Anesthesia:  General    Additional requests/questions:    SignedAce Gins   05/26/2022, 10:46 AM

## 2022-05-26 NOTE — Telephone Encounter (Signed)
Clearance faxed to Dr Rockey Situ

## 2022-05-28 NOTE — Telephone Encounter (Signed)
   Name: [name]  DOB: 1971/05/30  MRN: [id number]   Primary Cardiologist: Ida Rogue, MD  Chart reviewed as part of pre-operative protocol coverage.   [name] was last seen on 05/20/22 by Dr. Rockey Situ.    Per Dr. Rockey Situ: Acceptable risk for procedure/colonoscopy  We just need to make anesthesia aware of his labile pressure which is secondary to neurocardiogenic  Would recommend [name] take midodrine morning of the colonoscopy to help support his blood pressure with anesthesia  Therefore, based on ACC/AHA guidelines, the patient would be at acceptable risk for the planned procedure without further cardiovascular testing.    I will route this recommendation to the requesting party via Epic fax function and remove from pre-op pool. Please call with questions.  Tami Lin Dorrene Bently, PA 05/28/2022, 5:29 PM

## 2022-05-30 DIAGNOSIS — E109 Type 1 diabetes mellitus without complications: Secondary | ICD-10-CM | POA: Diagnosis not present

## 2022-05-30 DIAGNOSIS — Z794 Long term (current) use of insulin: Secondary | ICD-10-CM | POA: Diagnosis not present

## 2022-06-01 NOTE — Telephone Encounter (Signed)
I s/w the pt and advised of the recommendation from Dr. Mariah Milling to be sure to take his Midodrine the morning of his colonoscopy in august. Pt agreeable to plan of care. Pt aware notes have been faxed to requesting office as well

## 2022-06-04 ENCOUNTER — Encounter: Payer: Self-pay | Admitting: Cardiovascular Disease

## 2022-06-15 ENCOUNTER — Encounter: Payer: Medicare Other | Attending: Physician Assistant | Admitting: Physician Assistant

## 2022-06-15 DIAGNOSIS — I110 Hypertensive heart disease with heart failure: Secondary | ICD-10-CM | POA: Insufficient documentation

## 2022-06-15 DIAGNOSIS — G8220 Paraplegia, unspecified: Secondary | ICD-10-CM | POA: Diagnosis not present

## 2022-06-15 DIAGNOSIS — I5042 Chronic combined systolic (congestive) and diastolic (congestive) heart failure: Secondary | ICD-10-CM | POA: Insufficient documentation

## 2022-06-15 DIAGNOSIS — E10621 Type 1 diabetes mellitus with foot ulcer: Secondary | ICD-10-CM | POA: Diagnosis not present

## 2022-06-15 DIAGNOSIS — L98422 Non-pressure chronic ulcer of back with fat layer exposed: Secondary | ICD-10-CM | POA: Diagnosis not present

## 2022-06-15 DIAGNOSIS — L89153 Pressure ulcer of sacral region, stage 3: Secondary | ICD-10-CM | POA: Insufficient documentation

## 2022-06-15 NOTE — Progress Notes (Addendum)
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 collagen the wound is better 03/16/18; the patient states that over the last several days he's had episodes of severe pain in the left heel. This can last for days at a time although later on he doesn't complain of any pain. He thinks the pain was about the same as when he had an infection. He's been using silver collagen and the wound area has actually reduced 03/30/18; 2 week follow-up. He had his vascular studies done yesterday apparently there is no macrovascular disease per the patient however I don't have this official report. He's been using silver collagen wound size is slightly smaller 04/13/18; 2 week follow-up. He had his vascular studies done with no macrovascular disease. He's been using silver collagen. Arise with the wound certainly no small or maybe less depth. He is adamant that he is wearing open heeled shoes and that he offloads this at all times. He tells me today that he's had abdominal pain and melanotic colored stools ready states he's always had liquid/soft stools. This has not changed. This morning he had abdominal pain and felt like he might  vomit. No fever no chills. He takes ibuprofen when necessary for pain. I've told him to stop this and he told me he already has 04/27/18; 2 week follow-up. He's been using Endoform for 2 weeks and certainly a better looking wound surface less depth and less surface area. Readmission: 11/27/2021 upon evaluation today patient appears to have a wound over the sacral region. He has been seen in the  clinic many times previous. With that being said he tells me that this is currently been going on for about 6 months. He has been using intermittently different things he did do some Hydrofera Blue at one point he also has been doing silver alginate more recently he tells me is draining quite a bit he is using a border foam dressing to cover. With that being said the biggest issue currently I believe in my opinion based on what I see is probably that there needs to be some more aggressive offloading. He tells me he has not really worry too much about where he was sitting for how long he was sitting due to the fact that this really was not a "pressure ulcer. With that being said I am concerned that this probably is a pressure ulcer based on what we are seeing here and I think how much he sits or the lack thereof is probably can have a direct impact on how well he heals to be perfectly honest. I discussed that with him today. With that being said I do believe that the silver alginate is actually doing a pretty good job based on what I see currently and I discussed that with him as well. I am not certain that there is anything that is clinically better. We discussed the possibility of collagen although to be perfectly honest I am not certain that with the amount of drainage she has but that he can get a be beneficial for him to be perfectly honest. I really think the fact that he sit most of the day in his chair is probably the biggest culprit. Also I do not think he is can be a good candidate for a wound VAC due  to how fragile and how much the skin folds and on himself at this location. 12/18/2021 upon evaluation today patient's wound is actually showing some signs of improvement noted currently. Fortunately there does not appear to be any evidence of active infection locally nor systemically at this time which is great news. He may have a little bit of epithelial growth as well which is also great news. Fortunately I do not see any signs of active infection which is great news. Overall I think that he is making good progress considering the wound and where it is at. 01/26/2022 upon evaluation today patient's sacral wound actually is showing some signs of improvement. This is actually a very slow process and I explained that is going to be due to how the skin folds are folding in on themselves as well. Fortunately I do not see any signs of active infection locally nor systemically at this point which is great news. With that being said I overall feel that we are headed in the right direction this is just can take some time. 02/23/2022 upon evaluation today patient appears to actually be doing about the same in regard to his wound. There is really not much improvement compared to previous studies location. 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 This is causing some significant issues likely with healing as well. All of this was found by his primary care provider and subsequently the patient has been referred to Dr. Dahlia Byes who is a local surgery specialist in order to discuss treatment going forward. 03-23-2022 upon evaluation today patient appears to be doing well with regard to his wound  all things considered. He subsequently is not going to be having the surgery for the fistula as to be honest the surgeon basically stated he was not able to even identify the fistula on examination  which would make it difficult to manage as it is anyway. With that being said overall again the wounds do not appear to be worse I think the main issue is simply the pressure and the friction from sliding and repositioning he is very itself sufficient and independent he takes care of himself primarily and to be honest he really is not able to just take it easy and stay off of this on a to regular basis. With that being said he does tell me that overall he feels like that the wound is doing better sometimes but then other times seems to get worse he felt like he was doing a lot better and then his sodium apparently was 2 low they wanted him to stop drinking so much and when he did that then his bowel habits changed dramatically. Subsequently this seems to make the wound worse. 04-20-2022 upon evaluation today patient's wound is actually showing signs of excellent improvement. I am actually very pleased with where we stand today and there does not appear to be any signs of active infection locally or systemically at this time which is great news. 05-18-2022 upon evaluation today patient appears to be doing well with regard to his wound. He has been tolerating the dressing changes without complication. Fortunately I do not see any evidence of infection visually though his had increased drainage over the past week this does raise the question of infection in general. Overall I am very pleased with where things stand currently. 06-15-2022 upon evaluation today patient appears to be doing about the same in regard to his wound. He continues to have a lot of issues with shearing and friction and again I think this is a big part of what is causing this to be so hard to heal. Fortunately I do not see any signs of active infection locally or systemically at this time. Objective [name], [name]. ([id number]) Constitutional Well-nourished and well-hydrated in no acute distress. Vitals Time Taken: 2:55 PM, Height:  72 in, Weight: 175 lbs, BMI: 23.7, Temperature: 97.8 F, Pulse: 58 bpm, Respiratory Rate: 18 breaths/min, Blood Pressure: 110/74 mmHg. Respiratory normal breathing without difficulty. Psychiatric this patient is able to make decisions and demonstrates good insight into disease process. Alert and Oriented x 3. pleasant and cooperative. General Notes: Upon inspection patient's wound bed showed evidence of good granulation and epithelization at this point. Fortunately I do not see any evidence of infection and overall I think that I can still part of the biggest issue is the fact that he is not able to completely offload he is continuing to have to move around and transfer which includes friction. Integumentary (Hair, Skin) Wound #8 status is Open. Original cause of wound was Gradually Appeared. The date acquired was: 05/07/2021. The wound has been in treatment 28 weeks. The wound is located on the Midline Sacrum. The wound measures 3cm length x 2cm width x 1cm depth; 4.712cm^2 area and 4.712cm^3 volume. There is Fat Layer (Subcutaneous Tissue) exposed. There is no tunneling or undermining noted. There is a medium amount of serosanguineous drainage noted. The wound margin is thickened. There is large (67-100%) red granulation within the wound bed. There is no necrotic tissue within the wound bed. 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 Bathing/ Shower/ Hygiene: May shower; gently cleanse wound with antibacterial soap, rinse and pat dry prior to dressing wounds WOUND #8: - Sacrum Wound Laterality: Midline Cleanser: Byram Ancillary Kit - 15 Day Supply (Generic) 1 x Per Day/30 Days Discharge Instructions: Use supplies as instructed; Kit  contains: (15) Saline Bullets; (15) 3x3 Gauze; 15 pr Gloves Cleanser: Soap and Water 1 x Per Day/30 Days Discharge Instructions: Gently cleanse wound with antibacterial soap, rinse and pat dry prior to dressing wounds Primary Dressing: Silvercel Small 2x2 (in/in) (Generic) 1 x Per Day/30 Days Discharge Instructions: Apply Silvercel Small 2x2 (in/in) as instructed Secondary Dressing: (SILICONE BORDER) Zetuvit Plus SILICONE BORDER Dressing 4x4 (in/in) (Generic) 1 x Per Day/30 Days 1. Would recommend that we go ahead and continue with the wound care measures as before and the patient is in agreement with plan. This includes the use of the silver alginate dressing which I do believe is doing quite well. And hopefully the wound will start to show signs of improvement overall it really does look good otherwise. 2. I am also can recommend we have the patient continue with the bordered foam dressing to cover which does seem to be doing well. 3. I am also going to suggest that he continue to monitor for any signs of worsening infection if anything changes he should let me know. We will see patient back for reevaluation in 4 weeks here in the clinic. If anything worsens or changes patient will contact our office for additional recommendations. This is by patient request. Electronic Signature(s) Signed: 06/15/2022 3:26:00 PM By: Irean Hong Bab[place], [place] ([id number]) Entered By: Worthy Keeler on 06/15/2022 15:26:00 Shinault, [name] ([id number]) -------------------------------------------------------------------------------- SuperBill Details Patient Name: [name]. Date of Service: 06/15/2022 Medical Record Number: [id number] Patient Account Number: [id number] Date of Birth/Sex: February 08, 1971 (51 y.o. 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 I50.42 Chronic combined systolic (congestive) and diastolic (congestive) heart failure Facility Procedures CPT4 Code: [id number] Description: [phone] - WOUND CARE VISIT-LEV 2 EST PT Modifier: Quantity: 1 Physician Procedures CPT4 Code: [id number] Description: 99213 - WC PHYS LEVEL 3 - EST PT Modifier: Quantity: 1 CPT4 Code: Description: ICD-10 Diagnosis Description L89.153 Pressure ulcer of sacral region, stage 3 G82.20 Paraplegia, unspecified E10.621 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

## 2022-06-16 DIAGNOSIS — S31809A Unspecified open wound of unspecified buttock, initial encounter: Secondary | ICD-10-CM | POA: Diagnosis not present

## 2022-06-19 NOTE — Progress Notes (Signed)
TRESEAN, MATTIX ([id number]) Visit Report for 06/15/2022 Arrival Information Details Patient Name: [name], [name]. Date of Service: 06/15/2022 2:45 PM Medical Record Number: [id number] Patient Account Number: [id number] Date of Birth/Sex: 02-22-1971 (51 y.o. M) Treating RN: Carlene Coria Primary Care Freeman Borba: Einar Pheasant Other Clinician: Referring Lesleigh Hughson: Einar Pheasant Treating Lariyah Shetterly/Extender: Skipper Cliche in Treatment: 28 Visit Information History Since Last Visit All ordered tests and consults were completed: No Patient Arrived: Wheel Chair Added or deleted any medications: No Arrival Time: 14:47 Any new allergies or adverse reactions: No Accompanied By: self Had a fall or experienced change in No Transfer Assistance: None activities of daily living that may affect Patient Identification Verified: Yes risk of falls: Patient Requires Transmission-Based Precautions: No Signs or symptoms of abuse/neglect since last visito No Patient Has Alerts: No Hospitalized since last visit: No Implantable device outside of the clinic excluding No cellular tissue based products placed in the center since last visit: Has Dressing in Place as Prescribed: Yes Has Compression in Place as Prescribed: Yes Pain Present Now: No Electronic Signature(s) Signed: 06/19/2022 10:45:13 AM By: Carlene Coria RN Entered By: Carlene Coria on 06/15/2022 14:55:54 Rickles, Lahoma Rocker ([id number]) -------------------------------------------------------------------------------- Clinic Level of Care Assessment Details Patient Name: [name]. Date of Service: 06/15/2022 2:45 PM Medical Record Number: [id number] Patient Account Number: [id number] Date of Birth/Sex: Nov 15, 1971 (51 y.o. M) Treating RN: Carlene Coria Primary Care Baxter Gonzalez: Einar Pheasant Other Clinician: Referring Arie Powell: Einar Pheasant Treating Kizzy Olafson/Extender: Skipper Cliche in Treatment: 28 Clinic Level of Care  Assessment Items TOOL 4 Quantity Score X - Use when only an EandM is performed on FOLLOW-UP visit 1 0 ASSESSMENTS - Nursing Assessment / Reassessment X - Reassessment of Co-morbidities (includes updates in patient status) 1 10 X- 1 5 Reassessment of Adherence to Treatment Plan ASSESSMENTS - Wound and Skin Assessment / Reassessment X - Simple Wound Assessment / Reassessment - one wound 1 5 _0  - 0 Complex Wound Assessment / Reassessment - multiple wounds _1  - 0 Dermatologic / Skin Assessment (not related to wound area) ASSESSMENTS - Focused Assessment _2  - Circumferential Edema Measurements - multi extremities 0 _3  - 0 Nutritional Assessment / Counseling / Intervention _4  - 0 Lower Extremity Assessment (monofilament, tuning fork, pulses) _5  - 0 Peripheral Arterial Disease Assessment (using hand held doppler) ASSESSMENTS - Ostomy and/or Continence Assessment and Care _6  - Incontinence Assessment and Management 0 _7  - 0 Ostomy Care Assessment and Management (repouching, etc.) PROCESS - Coordination of Care X - Simple Patient / Family Education for ongoing care 1 15 _8  - 0 Complex (extensive) Patient / Family Education for ongoing care _9  - 0 Staff obtains Programmer, systems, Records, Test Results / Process Orders _10  - 0 Staff telephones HHA, Nursing Homes / Clarify orders / etc _11  - 0 Routine Transfer to another Facility (non-emergent condition) _12  - 0 Routine Hospital Admission (non-emergent condition) _13  - 0 New Admissions / Biomedical engineer / Ordering NPWT, Apligraf, etc. _14  - 0 Emergency Hospital Admission (emergent condition) X- 1 10 Simple Discharge Coordination _15  - 0 Complex (extensive) Discharge Coordination PROCESS - Special Needs _16  - Pediatric / Minor Patient Management 0 _17  - 0 Isolation Patient Management _18  - 0 Hearing / Language / Visual special needs _19  - 0 Assessment of Community assistance (transportation, D/C planning, etc.) _20  - 0 Additional  assistance / Altered mentation _21  - 0 Support Surface(s) Assessment (bed, cushion, seat, etc.) INTERVENTIONS - Wound Cleansing / Measurement Pulver, Kayzen K. ([id number]) X- 1 5 Simple  Wound Cleansing - one wound _0  - 0 Complex Wound Cleansing - multiple wounds X- 1 5 Wound Imaging (photographs - any number of wounds) _1  - 0 Wound Tracing (instead of photographs) X- 1 5 Simple Wound Measurement - one wound _2  - 0 Complex Wound Measurement - multiple wounds INTERVENTIONS - Wound Dressings X - Small Wound Dressing one or multiple wounds 1 10 _3  - 0 Medium Wound Dressing one or multiple wounds _4  - 0 Large Wound Dressing one or multiple wounds <INOMVEHMCNOBSJGG>_8<\/ZMOQHUTMLYYTKPTW>_6  - 0 Application of Medications - topical <FKCLEXNTZGYFVCBS>_4<\/HQPRFFMBWGYKZLDJ>_5  - 0 Application of Medications - injection INTERVENTIONS - Miscellaneous _7  - External ear exam 0 _8  - 0 Specimen Collection (cultures, biopsies, blood, body fluids, etc.) _9  - 0 Specimen(s) / Culture(s) sent or taken to Lab for analysis _10  - 0 Patient Transfer (multiple staff / Civil Service fast streamer / Similar devices) _11  - 0 Simple Staple / Suture removal (25 or less) _12  - 0 Complex Staple / Suture removal (26 or more) _13  - 0 Hypo / Hyperglycemic Management (close monitor of Blood Glucose) _14  - 0 Ankle / Brachial Index (ABI) - do not check if billed separately X- 1 5 Vital Signs Has the patient been seen at the hospital within the last three years: Yes Total Score: 75 Level Of Care: New/Established - Level 2 Electronic Signature(s) Signed: 06/19/2022 10:45:13 AM By: Carlene Coria RN Entered By: Carlene Coria on 06/15/2022 15:18:12 Stahnke, Lahoma Rocker ([id number]) -------------------------------------------------------------------------------- Encounter Discharge Information Details Patient Name: [name]. Date of Service: 06/15/2022 2:45 PM Medical Record Number: [id number] Patient Account Number: [id number] Date of Birth/Sex: 08/24/1971 (51 y.o. M) Treating RN: Carlene Coria Primary  Care Derron Pipkins: Einar Pheasant Other Clinician: Referring Yasmyn Bellisario: Einar Pheasant Treating Litisha Guagliardo/Extender: Skipper Cliche in Treatment: 28 Encounter Discharge Information Items Discharge Condition: Stable Ambulatory Status: Wheelchair Discharge Destination: Home Transportation: Private Auto Accompanied By: self Schedule Follow-up Appointment: Yes Clinical Summary of Care: Electronic Signature(s) Signed: 06/19/2022 10:45:13 AM By: Carlene Coria RN Entered By: Carlene Coria on 06/15/2022 15:24:04 [place], Lahoma Rocker ([id number]) -------------------------------------------------------------------------------- Lower Extremity Assessment Details Patient Name: [name]. Date of Service: 06/15/2022 2:45 PM Medical Record Number: [id number] Patient Account Number: [id number] Date of Birth/Sex: June 18, 1971 (51 y.o. M) Treating RN: Carlene Coria Primary Care Meghin Thivierge: Einar Pheasant Other Clinician: Referring Geni Skorupski: Einar Pheasant Treating Glade Strausser/Extender: Jeri Cos Weeks in Treatment: 28 Electronic Signature(s) Signed: 06/19/2022 10:45:13 AM By: Carlene Coria RN Entered By: Carlene Coria on 06/15/2022 15:04:24 Beynon, Lahoma Rocker ([id number]) -------------------------------------------------------------------------------- Multi Wound Chart Details Patient Name: [name]. Date of Service: 06/15/2022 2:45 PM Medical Record Number: [id number] Patient Account Number: [id number] Date of Birth/Sex: Mar 03, 1971 (51 y.o. M) Treating RN: Carlene Coria Primary Care Nesa Distel: Einar Pheasant Other Clinician: Referring Noami Bove: Einar Pheasant Treating Clarabelle Oscarson/Extender: Skipper Cliche in Treatment: 28 Vital Signs Height(in): 72 Pulse(bpm): 22 Weight(lbs): 175 Blood Pressure(mmHg): 110/74 Body Mass Index(BMI): 23.7 Temperature(F): 97.8 Respiratory Rate(breaths/min): 18 Photos: [8:No Photos] [N/A:N/A] Wound Location: [8:Midline Sacrum] [N/A:N/A] Wounding Event:  [8:Gradually Appeared] [N/A:N/A] Primary Etiology: [8:Pressure Ulcer] [N/A:N/A] Comorbid History: [8:Congestive Heart Failure, Type I Diabetes, History of pressure wounds, Paraplegia] [N/A:N/A] Date Acquired: [8:05/07/2021] [N/A:N/A] Weeks of Treatment: [8:28] [N/A:N/A] Wound Status: [8:Open] [N/A:N/A] Wound Recurrence: [8:No] [N/A:N/A] Measurements L x W x D (cm) [8:3x2x1] [N/A:N/A] Area (cm) : [8:4.712] [N/A:N/A] Volume (cm) : [8:4.712] [N/A:N/A] % Reduction in Area: [8:14.30%] [N/A:N/A] % Reduction in Volume: [8:-114.30%] [N/A:N/A] Classification: [8:Category/Stage III] [N/A:N/A] Exudate Amount: [8:Medium] [N/A:N/A] Exudate Type: [8:Serosanguineous] [N/A:N/A] Exudate Color: [8:red, brown] [N/A:N/A] Wound Margin: [  8:Thickened] [N/A:N/A] Granulation Amount: [8:Large (67-100%)] [N/A:N/A] Granulation Quality: [8:Red] [N/A:N/A] Necrotic Amount: [8:None Present (0%)] [N/A:N/A] Exposed Structures: [8:Fat Layer (Subcutaneous Tissue): Yes Fascia: No Tendon: No Muscle: No Joint: No Bone: No None] [N/A:N/A N/A] Treatment Notes Electronic Signature(s) Signed: 06/19/2022 10:45:13 AM By: Carlene Coria RN Entered By: Carlene Coria on 06/15/2022 15:04:38 Delossantos, Lahoma Rocker ([id number]) -------------------------------------------------------------------------------- Vincent Details Patient Name: [name]. Date of Service: 06/15/2022 2:45 PM Medical Record Number: [id number] Patient Account Number: [id number] Date of Birth/Sex: [birth year] (51 y.o. M) Treating RN: Carlene Coria Primary Care Jorge Amparo: Einar Pheasant Other Clinician: Referring Allina Riches: Einar Pheasant Treating Darrien Laakso/Extender: Skipper Cliche in Treatment: 28 Active Inactive Wound/Skin Impairment Nursing Diagnoses: Knowledge deficit related to ulceration/compromised skin integrity Goals: Patient/caregiver will verbalize understanding of skin care regimen Date Initiated: 11/27/2021 Target  Resolution Date: 12/28/2021 Goal Status: Active Ulcer/skin breakdown will have a volume reduction of 30% by week 4 Date Initiated: 11/27/2021 Target Resolution Date: 01/28/2022 Goal Status: Active Ulcer/skin breakdown will have a volume reduction of 50% by week 8 Date Initiated: 11/27/2021 Target Resolution Date: 02/25/2022 Goal Status: Active Ulcer/skin breakdown will have a volume reduction of 80% by week 12 Date Initiated: 11/27/2021 Target Resolution Date: 03/28/2022 Goal Status: Active Ulcer/skin breakdown will heal within 14 weeks Date Initiated: 11/27/2021 Target Resolution Date: 04/27/2022 Goal Status: Active Interventions: Assess patient/caregiver ability to obtain necessary supplies Assess patient/caregiver ability to perform ulcer/skin care regimen upon admission and as needed Assess ulceration(s) every visit Notes: Electronic Signature(s) Signed: 06/19/2022 10:45:13 AM By: Carlene Coria RN Entered By: Carlene Coria on 06/15/2022 15:04:31 [place], Lahoma Rocker ([id number]) -------------------------------------------------------------------------------- Pain Assessment Details Patient Name: [name]. Date of Service: 06/15/2022 2:45 PM Medical Record Number: [id number] Patient Account Number: [id number] Date of Birth/Sex: 1971/11/18 (51 y.o. M) Treating RN: Carlene Coria Primary Care Dairon Procter: Einar Pheasant Other Clinician: Referring Jahni Nazar: Einar Pheasant Treating Donica Derouin/Extender: Skipper Cliche in Treatment: 28 Active Problems Location of Pain Severity and Description of Pain Patient Has Paino No Site Locations Pain Management and Medication Current Pain Management: Electronic Signature(s) Signed: 06/19/2022 10:45:13 AM By: Carlene Coria RN Entered By: Carlene Coria on 06/15/2022 14:56:17 [place], Lahoma Rocker ([id number]) -------------------------------------------------------------------------------- Patient/Caregiver Education Details Patient Name: [name]. Date of Service: 06/15/2022 2:45 PM Medical Record Number: [id number] Patient Account Number: [id number] Date of Birth/Gender: 08/27/1971 (51 y.o. M) Treating RN: Carlene Coria Primary Care Physician: Einar Pheasant Other Clinician: Referring Physician: Einar Pheasant Treating Physician/Extender: Skipper Cliche in Treatment: 28 Education Assessment Education Provided To: Patient Education Topics Provided Wound/Skin Impairment: Methods: Explain/Verbal Responses: State content correctly Electronic Signature(s) Signed: 06/19/2022 10:45:13 AM By: Carlene Coria RN Entered By: Carlene Coria on 06/15/2022 15:18:27 [place], Lahoma Rocker ([id number]) -------------------------------------------------------------------------------- Wound Assessment Details Patient Name: [name]. Date of Service: 06/15/2022 2:45 PM Medical Record Number: [id number] Patient Account Number: [id number] Date of Birth/Sex: 03-07-1971 (51 y.o. M) Treating RN: Carlene Coria Primary Care Estanislao Harmon: Einar Pheasant Other Clinician: Referring Mindee Robledo: Einar Pheasant Treating Juliann Olesky/Extender: Skipper Cliche in Treatment: 28 Wound Status Wound Number: 8 Primary Pressure Ulcer Etiology: Wound Location: Midline Sacrum Wound Status: Open Wounding Event: Gradually Appeared Comorbid Congestive Heart Failure, Type I Diabetes, History of Date Acquired: 05/07/2021 History: pressure wounds, Paraplegia Weeks Of Treatment: 28 Clustered Wound: No Wound Measurements Length: (cm) 3 Width: (cm) 2 Depth: (cm) 1 Area: (cm) 4.712 Volume: (cm) 4.712 % Reduction in Area: 14.3% % Reduction in Volume: -114.3% Epithelialization: None Tunneling: No Undermining: No Wound Description Classification: Category/Stage III  Wound Margin: Thickened Exudate Amount: Medium Exudate Type: Serosanguineous Exudate Color: red, brown Foul Odor After Cleansing: No Slough/Fibrino No Wound Bed Granulation Amount: Large  (67-100%) Exposed Structure Granulation Quality: Red Fascia Exposed: No Necrotic Amount: None Present (0%) Fat Layer (Subcutaneous Tissue) Exposed: Yes Tendon Exposed: No Muscle Exposed: No Joint Exposed: No Bone Exposed: No Treatment Notes Wound #8 (Sacrum) Wound Laterality: Midline Cleanser Byram Ancillary Kit - 15 Day Supply Discharge Instruction: Use supplies as instructed; Kit contains: (15) Saline Bullets; (15) 3x3 Gauze; 15 pr Gloves Soap and Water Discharge Instruction: Gently cleanse wound with antibacterial soap, rinse and pat dry prior to dressing wounds Peri-Wound Care Topical Primary Dressing Silvercel Small 2x2 (in/in) Discharge Instruction: Apply Silvercel Small 2x2 (in/in) as instructed Secondary Dressing (SILICONE BORDER) Zetuvit Plus SILICONE BORDER Dressing 4x4 (in/in) Secured With Compression Wrap [place], SAMEUL TAGLE ([id number]) Compression Stockings Add-Ons Electronic Signature(s) Signed: 06/19/2022 10:45:13 AM By: Carlene Coria RN Entered By: Carlene Coria on 06/15/2022 15:04:09 [place], Lahoma Rocker ([id number]) -------------------------------------------------------------------------------- Vitals Details Patient Name: [name]. Date of Service: 06/15/2022 2:45 PM Medical Record Number: [id number] Patient Account Number: [id number] Date of Birth/Sex: 1971-11-02 (51 y.o. M) Treating RN: Carlene Coria Primary Care Jackalyn Haith: Einar Pheasant Other Clinician: Referring Malikai Gut: Einar Pheasant Treating Sacha Radloff/Extender: Skipper Cliche in Treatment: 28 Vital Signs Time Taken: 14:55 Temperature (F): 97.8 Height (in): 72 Pulse (bpm): 58 Weight (lbs): 175 Respiratory Rate (breaths/min): 18 Body Mass Index (BMI): 23.7 Blood Pressure (mmHg): 110/74 Reference Range: 80 - 120 mg / dl Electronic Signature(s) Signed: 06/19/2022 10:45:13 AM By: Carlene Coria RN Entered By: Carlene Coria on 06/15/2022 14:56:11

## 2022-06-22 ENCOUNTER — Telehealth: Payer: Self-pay | Admitting: Internal Medicine

## 2022-06-22 NOTE — Telephone Encounter (Signed)
3 attempts to schedule fu appt from recall list.   Deleting recall.   

## 2022-06-24 ENCOUNTER — Other Ambulatory Visit: Payer: Self-pay | Admitting: Internal Medicine

## 2022-06-30 DIAGNOSIS — E109 Type 1 diabetes mellitus without complications: Secondary | ICD-10-CM | POA: Diagnosis not present

## 2022-06-30 DIAGNOSIS — Z794 Long term (current) use of insulin: Secondary | ICD-10-CM | POA: Diagnosis not present

## 2022-07-02 ENCOUNTER — Other Ambulatory Visit: Payer: Self-pay | Admitting: Internal Medicine

## 2022-07-07 ENCOUNTER — Encounter: Payer: Self-pay | Admitting: Gastroenterology

## 2022-07-07 ENCOUNTER — Ambulatory Visit
Admission: RE | Admit: 2022-07-07 | Discharge: 2022-07-07 | Disposition: A | Discharge status: Home / Self Care | Payer: Medicare Other | Attending: Gastroenterology | Admitting: Gastroenterology

## 2022-07-07 ENCOUNTER — Encounter: Payer: Self-pay | Admitting: General Practice

## 2022-07-07 ENCOUNTER — Encounter
Admission: RE | Disposition: A | Discharge status: Home / Self Care | Payer: Self-pay | Source: Home / Self Care | Attending: Gastroenterology

## 2022-07-07 DIAGNOSIS — R194 Change in bowel habit: Secondary | ICD-10-CM

## 2022-07-07 SURGERY — COLONOSCOPY WITH PROPOFOL
Anesthesia: General

## 2022-07-07 MED ORDER — SODIUM CHLORIDE 0.9 % IV SOLN: INTRAVENOUS | Status: DC
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
## 2022-07-07 NOTE — Progress Notes (Signed)
Patient in today for scheduled Colonoscopy with Dr Allen Norris.  Upon intial encounter, it was noted that the patient had been using smokeless tobacco prior to and on arrival in Endoscopy.  Per Dr Barbra Sarks with Anesthesia, it was determined that the case would be cancelled for today since the patient has not been NPO.  The office will call the patient for a new date for colonoscopy.  The patient verbalizes understanding and has been discharged.
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
## 2022-07-09 ENCOUNTER — Encounter: Payer: Self-pay | Admitting: Gastroenterology

## 2022-07-13 ENCOUNTER — Encounter: Payer: Medicare Other | Attending: Physician Assistant | Admitting: Physician Assistant

## 2022-07-13 ENCOUNTER — Emergency Department
Admission: EM | Admit: 2022-07-13 | Discharge: 2022-07-13 | Discharge status: Against Medical Advice | Payer: Medicare Other | Attending: Emergency Medicine | Admitting: Emergency Medicine

## 2022-07-13 DIAGNOSIS — I10 Essential (primary) hypertension: Secondary | ICD-10-CM | POA: Diagnosis not present

## 2022-07-13 DIAGNOSIS — G8254 Quadriplegia, C5-C7 incomplete: Secondary | ICD-10-CM | POA: Diagnosis not present

## 2022-07-13 DIAGNOSIS — I5042 Chronic combined systolic (congestive) and diastolic (congestive) heart failure: Secondary | ICD-10-CM | POA: Diagnosis not present

## 2022-07-13 DIAGNOSIS — Z5321 Procedure and treatment not carried out due to patient leaving prior to being seen by health care provider: Secondary | ICD-10-CM | POA: Insufficient documentation

## 2022-07-13 DIAGNOSIS — E10621 Type 1 diabetes mellitus with foot ulcer: Secondary | ICD-10-CM | POA: Diagnosis not present

## 2022-07-13 DIAGNOSIS — G8220 Paraplegia, unspecified: Secondary | ICD-10-CM | POA: Diagnosis not present

## 2022-07-13 DIAGNOSIS — L89153 Pressure ulcer of sacral region, stage 3: Secondary | ICD-10-CM | POA: Diagnosis not present

## 2022-07-13 DIAGNOSIS — Z794 Long term (current) use of insulin: Secondary | ICD-10-CM | POA: Diagnosis not present

## 2022-07-13 NOTE — Progress Notes (Signed)
AISON, MALVEAUX ([id number]) Visit Report for 07/13/2022 Arrival Information Details Patient Name: [name], [name]. Date of Service: 07/13/2022 2:45 PM Medical Record Number: [id number] Patient Account Number: [id number] Date of Birth/Sex: Sep 30, 1971 (51 y.o. M) Treating RN: Carlene Coria Primary Care Deysi Soldo: Einar Pheasant Other Clinician: Referring Mikell Camp: Einar Pheasant Treating Aliscia Clayton/Extender: Skipper Cliche in Treatment: 86 Visit Information History Since Last Visit All ordered tests and consults were completed: No Patient Arrived: Wheel Chair Added or deleted any medications: No Arrival Time: 14:45 Any new allergies or adverse reactions: No Accompanied By: self Had a fall or experienced change in No Transfer Assistance: None activities of daily living that may affect Patient Identification Verified: Yes risk of falls: Secondary Verification Process Completed: Yes Signs or symptoms of abuse/neglect since last visito No Patient Requires Transmission-Based Precautions: No Hospitalized since last visit: No Patient Has Alerts: No Implantable device outside of the clinic excluding No cellular tissue based products placed in the center since last visit: Has Dressing in Place as Prescribed: Yes Pain Present Now: No Electronic Signature(s) Unsigned Entered ByCarlene Coria on 07/13/2022 14:49:36 Signature(s): Date(s): Stick, Lahoma Rocker ([id number]) -------------------------------------------------------------------------------- Clinic Level of Care Assessment Details Patient Name: [name], [name]. Date of Service: 07/13/2022 2:45 PM Medical Record Number: [id number] Patient Account Number: [id number] Date of Birth/Sex: 1971/12/03 (51 y.o. M) Treating RN: Carlene Coria Primary Care Shama Monfils: Einar Pheasant Other Clinician: Referring Mitsuru Dault: Einar Pheasant Treating Hansini Clodfelter/Extender: Skipper Cliche in Treatment: 32 Clinic Level of Care Assessment Items TOOL 4  Quantity Score X - Use when only an EandM is performed on FOLLOW-UP visit 1 0 ASSESSMENTS - Nursing Assessment / Reassessment X - Reassessment of Co-morbidities (includes updates in patient status) 1 10 X- 1 5 Reassessment of Adherence to Treatment Plan ASSESSMENTS - Wound and Skin Assessment / Reassessment X - Simple Wound Assessment / Reassessment - one wound 1 5 []  - 0 Complex Wound Assessment / Reassessment - multiple wounds []  - 0 Dermatologic / Skin Assessment (not related to wound area) ASSESSMENTS - Focused Assessment []  - Circumferential Edema Measurements - multi extremities 0 []  - 0 Nutritional Assessment / Counseling / Intervention []  - 0 Lower Extremity Assessment (monofilament, tuning fork, pulses) []  - 0 Peripheral Arterial Disease Assessment (using hand held doppler) ASSESSMENTS - Ostomy and/or Continence Assessment and Care []  - Incontinence Assessment and Management 0 []  - 0 Ostomy Care Assessment and Management (repouching, etc.) PROCESS - Coordination of Care X - Simple Patient / Family Education for ongoing care 1 15 []  - 0 Complex (extensive) Patient / Family Education for ongoing care []  - 0 Staff obtains Programmer, systems, Records, Test Results / Process Orders []  - 0 Staff telephones HHA, Nursing Homes / Clarify orders / etc []  - 0 Routine Transfer to another Facility (non-emergent condition) []  - 0 Routine Hospital Admission (non-emergent condition) []  - 0 New Admissions / Biomedical engineer / Ordering NPWT, Apligraf, etc. []  - 0 Emergency Hospital Admission (emergent condition) X- 1 10 Simple Discharge Coordination []  - 0 Complex (extensive) Discharge Coordination PROCESS - Special Needs []  - Pediatric / Minor Patient Management 0 []  - 0 Isolation Patient Management []  - 0 Hearing / Language / Visual special needs []  - 0 Assessment of Community assistance (transportation, D/C planning, etc.) []  - 0 Additional assistance / Altered  mentation []  - 0 Support Surface(s) Assessment (bed, cushion, seat, etc.) INTERVENTIONS - Wound Cleansing / Measurement Ahuja, Nihar K. ([id number]) []  - 0 Simple Wound Cleansing - one wound []  -  0 Complex Wound Cleansing - multiple wounds []  - 0 Wound Imaging (photographs - any number of wounds) []  - 0 Wound Tracing (instead of photographs) []  - 0 Simple Wound Measurement - one wound []  - 0 Complex Wound Measurement - multiple wounds INTERVENTIONS - Wound Dressings []  - Small Wound Dressing one or multiple wounds 0 []  - 0 Medium Wound Dressing one or multiple wounds []  - 0 Large Wound Dressing one or multiple wounds []  - 0 Application of Medications - topical []  - 0 Application of Medications - injection INTERVENTIONS - Miscellaneous []  - External ear exam 0 []  - 0 Specimen Collection (cultures, biopsies, blood, body fluids, etc.) []  - 0 Specimen(s) / Culture(s) sent or taken to Lab for analysis []  - 0 Patient Transfer (multiple staff / Civil Service fast streamer / Similar devices) []  - 0 Simple Staple / Suture removal (25 or less) []  - 0 Complex Staple / Suture removal (26 or more) []  - 0 Hypo / Hyperglycemic Management (close monitor of Blood Glucose) []  - 0 Ankle / Brachial Index (ABI) - do not check if billed separately X- 1 5 Vital Signs Has the patient been seen at the hospital within the last three years: Yes Total Score: 50 Level Of Care: New/Established - Level 2 Electronic Signature(s) Unsigned Entered ByCarlene Coria on 07/13/2022 15:01:49 Signature(s): Date(s): Dagostino, Lahoma Rocker ([id number]) -------------------------------------------------------------------------------- Encounter Discharge Information Details Patient Name: [name], [name]. Date of Service: 07/13/2022 2:45 PM Medical Record Number: [id number] Patient Account Number: [id number] Date of Birth/Sex: 1971/06/25 (51 y.o. M) Treating RN: Carlene Coria Primary Care Abednego Yeates: Einar Pheasant Other  Clinician: Referring Sharron Simpson: Einar Pheasant Treating Paralee Pendergrass/Extender: Skipper Cliche in Treatment: 32 Encounter Discharge Information Items Discharge Condition: Stable Ambulatory Status: Wheelchair Discharge Destination: Home Transportation: Private Auto Accompanied By: self Schedule Follow-up Appointment: Yes Clinical Summary of Care: Electronic Signature(s) Unsigned Entered ByCarlene Coria on 07/13/2022 15:03:04 Signature(s): Date(s): Wisehart, Lahoma Rocker ([id number]) -------------------------------------------------------------------------------- General Visit Notes Details Patient Name: [name]. Date of Service: 07/13/2022 2:45 PM Medical Record Number: [id number] Patient Account Number: [id number] Date of Birth/Sex: 1971/06/27 (51 y.o. M) Treating RN: Carlene Coria Primary Care Koehn Salehi: Einar Pheasant Other Clinician: Referring Nickie Warwick: Einar Pheasant Treating Merlie Noga/Extender: Skipper Cliche in Treatment: 32 Notes noted patient blood pressure 260/130 manual left arm , patient reports he took midirine earlier to increase blood pressure due to dizziness and not feeling well. patient did not take his blood pressure prior to taking midirine. Jeri Cos notified instructed patient to be evaled at emergency room patient in agreement. instructed patient to continue wound care as previously ordered, verbalizes understanding. Electronic Signature(s) Signed: 07/13/2022 4:04:39 PM By: Carlene Coria RN Entered By: Carlene Coria on 07/13/2022 16:04:39 [place], Lahoma Rocker ([id number]) -------------------------------------------------------------------------------- Lower Extremity Assessment Details Patient Name: [name]. Date of Service: 07/13/2022 2:45 PM Medical Record Number: [id number] Patient Account Number: [id number] Date of Birth/Sex: [birth year] (51 y.o. M) Treating RN: Carlene Coria Primary Care Gaynelle Pastrana: Einar Pheasant Other Clinician: Referring Leniyah Martell:  Einar Pheasant Treating Thelia Tanksley/Extender: Jeri Cos Weeks in Treatment: 32 Electronic Signature(s) Unsigned Entered By: Carlene Coria on 07/13/2022 15:00:02 Signature(s): Date(s): Kauk, Lahoma Rocker ([id number]) -------------------------------------------------------------------------------- Multi Wound Chart Details Patient Name: [name], [name]. Date of Service: 07/13/2022 2:45 PM Medical Record Number: [id number] Patient Account Number: [id number] Date of Birth/Sex: 1971/01/21 (51 y.o. M) Treating RN: Carlene Coria Primary Care Deandrea Rion: Einar Pheasant Other Clinician: Referring Jaylianna Tatlock: Einar Pheasant Treating Valerya Maxton/Extender: Skipper Cliche in Treatment: 32 Vital Signs Height(in): 72 Pulse(bpm):  88 Weight(lbs): 175 Blood Pressure(mmHg): 260/130 Body Mass Index(BMI): 23.7 Temperature(F): 97.8 Respiratory Rate(breaths/min): 16 Wound Assessments Treatment Notes Electronic Signature(s) Unsigned Entered By: Carlene Coria on 07/13/2022 15:00:17 Signature(s): Date(s): Bridge, Lahoma Rocker ([id number]) -------------------------------------------------------------------------------- [place] Details Patient Name: [name], [name]. Date of Service: 07/13/2022 2:45 PM Medical Record Number: [id number] Patient Account Number: [id number] Date of Birth/Sex: 1971-06-09 (51 y.o. M) Treating RN: Carlene Coria Primary Care Edman Lipsey: Einar Pheasant Other Clinician: Referring Hanadi Stanly: Einar Pheasant Treating Briceyda Abdullah/Extender: Skipper Cliche in Treatment: 32 Active Inactive Wound/Skin Impairment Nursing Diagnoses: Knowledge deficit related to ulceration/compromised skin integrity Goals: Patient/caregiver will verbalize understanding of skin care regimen Date Initiated: 11/27/2021 Target Resolution Date: 12/28/2021 Goal Status: Active Ulcer/skin breakdown will have a volume reduction of 30% by week 4 Date Initiated: 11/27/2021 Target Resolution Date:  01/28/2022 Goal Status: Active Ulcer/skin breakdown will have a volume reduction of 50% by week 8 Date Initiated: 11/27/2021 Target Resolution Date: 02/25/2022 Goal Status: Active Ulcer/skin breakdown will have a volume reduction of 80% by week 12 Date Initiated: 11/27/2021 Target Resolution Date: 03/28/2022 Goal Status: Active Ulcer/skin breakdown will heal within 14 weeks Date Initiated: 11/27/2021 Target Resolution Date: 04/27/2022 Goal Status: Active Interventions: Assess patient/caregiver ability to obtain necessary supplies Assess patient/caregiver ability to perform ulcer/skin care regimen upon admission and as needed Assess ulceration(s) every visit Notes: Electronic Signature(s) Unsigned Entered By: Carlene Coria on 07/13/2022 15:00:09 Signature(s): Date(s): [name] ([id number]) -------------------------------------------------------------------------------- Pain Assessment Details Patient Name: [name], [name]. Date of Service: 07/13/2022 2:45 PM Medical Record Number: [id number] Patient Account Number: [id number] Date of Birth/Sex: 21-Feb-1971 (51 y.o. M) Treating RN: Carlene Coria Primary Care Karelyn Brisby: Einar Pheasant Other Clinician: Referring Aqua Denslow: Einar Pheasant Treating Sai Moura/Extender: Skipper Cliche in Treatment: 32 Active Problems Location of Pain Severity and Description of Pain Patient Has Paino No Site Locations Pain Management and Medication Current Pain Management: Electronic Signature(s) Unsigned Entered ByCarlene Coria on 07/13/2022 14:59:16 Signature(s): Date(s): Mamone, Lahoma Rocker ([id number]) -------------------------------------------------------------------------------- Patient/Caregiver Education Details Patient Name: [name]. Date of Service: 07/13/2022 2:45 PM Medical Record Number: [id number] Patient Account Number: [id number] Date of Birth/Gender: 1971-08-09 (51 y.o. M) Treating RN: Carlene Coria Primary Care  Physician: Einar Pheasant Other Clinician: Referring Physician: Einar Pheasant Treating Physician/Extender: Skipper Cliche in Treatment: 8 Education Assessment Education Provided To: Patient Education Topics Provided Wound/Skin Impairment: Methods: Explain/Verbal Responses: State content correctly Electronic Signature(s) Unsigned Entered By: Carlene Coria on 07/13/2022 15:02:04 Signature(s): Date(s): Skalicky, Lahoma Rocker ([id number]) -------------------------------------------------------------------------------- Vitals Details Patient Name: [name]. Date of Service: 07/13/2022 2:45 PM Medical Record Number: [id number] Patient Account Number: [id number] Date of Birth/Sex: 04/16/1971 (51 y.o. M) Treating RN: Carlene Coria Primary Care Carynn Felling: Einar Pheasant Other Clinician: Referring Seraya Jobst: Einar Pheasant Treating Aliyah Abeyta/Extender: Skipper Cliche in Treatment: 32 Vital Signs Time Taken: 14:58 Temperature (F): 97.8 Height (in): 72 Pulse (bpm): 88 Weight (lbs): 175 Respiratory Rate (breaths/min): 16 Body Mass Index (BMI): 23.7 Blood Pressure (mmHg): 260/130 Reference Range: 80 - 120 mg / dl Electronic Signature(s) Unsigned Entered ByCarlene Coria on 07/13/2022 14:59:09 Signature(s): Date(s):

## 2022-07-13 NOTE — ED Notes (Signed)
Patient states his BP is not as high as it was at doctors office and is no longer wanting to be checked out. Refused EKG and labs. Patient signed AMA and wheeled self out.

## 2022-07-13 NOTE — ED Provider Triage Note (Signed)
  Emergency Medicine Provider Triage Evaluation Note  [name] , a 51 y.o.male,  was evaluated in triage.  Pt complains of high blood pressure.  He states that he was feeling like his blood pressure was low earlier today, so he took a few of his midodrine.  However, when he went to his regular doctor today, they said that his blood pressure was over 220 systolic.  They sent him here to be evaluated.  Review of Systems  Positive: Hypertension Negative: Denies fever, chest pain, vomiting  Physical Exam  There were no vitals filed for this visit. Gen:   Awake, no distress   Resp:  Normal effort  MSK:   Moves extremities without difficulty  Other:    Medical Decision Making  Given the patient's initial medical screening exam, the following diagnostic evaluation has been ordered. The patient will be placed in the appropriate treatment space, once one is available, to complete the evaluation and treatment. I have discussed the plan of care with the patient and I have advised the patient that an ED physician or mid-level practitioner will reevaluate their condition after the test results have been received, as the results may give them additional insight into the type of treatment they may need.    Diagnostics: Labs, EKG  Treatments: none immediately   Teodoro Spray, [place] 07/13/22 1559

## 2022-07-13 NOTE — ED Triage Notes (Signed)
Patient states he was at his wound doctor and his BP was 260/130 and sent here for further evaluation. Patient took 2 of his midodrine early this morning thinking his BP is low.

## 2022-07-13 NOTE — Progress Notes (Signed)
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 gave him 1 loose stool a day however he has completed Augmentin and Omnicef for enterococcus and Acinetobacter cultured his wound. X-ray did not show evidence of bony abnormalities. He has been using silver collagen the wound is better 03/16/18; the patient states that over the last several days he's had episodes of severe pain in the left heel. This can last for days at a time although later on he doesn't complain of any pain. He thinks the pain was about the same as when he had an infection. He's been using silver collagen and the wound area has actually reduced 03/30/18; 2 week follow-up. He had his vascular studies done yesterday apparently there is no macrovascular disease per the patient however I don't have this official report. He's been using silver collagen wound size is slightly smaller 04/13/18; 2 week follow-up. He had his vascular studies done with no macrovascular disease. He's been using silver collagen. Arise with the wound certainly no small or maybe less depth. He is adamant that he is wearing open heeled shoes and that he offloads this at all times. He tells me today that he's had  abdominal pain and melanotic colored stools ready states he's always had liquid/soft stools. This has not changed. This morning he had abdominal pain and felt like he might vomit. No fever no chills. He takes ibuprofen when necessary for pain. I've told him to stop this and he told me  he already has 04/27/18; 2 week follow-up. He's been using Endoform for 2 weeks and certainly a better looking wound surface less depth and less surface area. Readmission: 11/27/2021 upon evaluation today patient appears to have a wound over the sacral region. He has been seen in the clinic many times previous. With that being said he tells me that this is currently been going on for about 6 months. He has been using intermittently different things he did do some Hydrofera Blue at one point he also has been doing silver alginate more recently he tells me is draining quite a bit he is using a border foam dressing to cover. With that being said the biggest issue currently I believe in my opinion based on what I see is probably that there needs to be some more aggressive offloading. He tells me he has not really worry too much about where he was sitting for how long he was sitting due to the fact that this really was not a "pressure ulcer. With that being said I am concerned that this probably is a pressure ulcer based on what we are seeing here and I think how much he sits or the lack thereof is probably can have a direct impact on how well he heals to be perfectly honest. I discussed that with him today. With that being said I do believe that the silver alginate is actually doing a pretty good job based on what I see currently and I discussed that with him as well. I am not certain that there is anything that is clinically better. We discussed the possibility of collagen although to be perfectly honest I am not certain that with the amount of drainage she has but that he can get a be beneficial for him to be perfectly  honest. I really think the fact that he sit most of the day in his chair is probably the biggest culprit. Also I do not think he is can be a good candidate for a wound VAC due to how fragile and how much the skin folds and on himself at this location. 12/18/2021 upon evaluation today patient's wound is actually showing some signs of improvement noted currently. Fortunately there does not appear to be any evidence of active infection locally nor systemically at this time which is great news. He may have a little bit of epithelial growth as well which is also great news. Fortunately I do not see any signs of active infection which is great news. Overall I think that he is making good progress considering the wound and where it is at. 01/26/2022 upon evaluation today patient's sacral wound actually is showing some signs of improvement. This is actually a very slow process and I explained that is going to be due to how the skin folds are folding in on themselves as well. Fortunately I do not see any signs of active infection locally nor systemically at this point which is great news. With that being said I overall feel that we are headed in the right direction this is just can take some time. 02/23/2022 upon evaluation today patient appears to actually be doing about the same in regard to his wound. There is really not much improvement compared to previous studies location. 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 This is causing some significant issues likely with healing as well.  All of this was found by his primary care provider and subsequently the patient has been referred to Dr. Dahlia Byes who is a local surgery specialist in order to discuss treatment going forward. 03-23-2022 upon evaluation today patient appears to be doing well with regard to his wound all things considered. He  subsequently is not going to be having the surgery for the fistula as to be honest the surgeon basically stated he was not able to even identify the fistula on examination which would make it difficult to manage as it is anyway. With that being said overall again the wounds do not appear to be worse I think the main issue is simply the pressure and the friction from sliding and repositioning he is very itself sufficient and independent he takes care of himself primarily and to be honest he really is not able to just take it easy and stay off of this on a to regular basis. With that being said he does tell me that overall he feels like that the wound is doing better sometimes but then other times seems to get worse he felt like he was doing a lot better and then his sodium apparently was 2 low they wanted him to stop drinking so much and when he did that then his bowel habits changed dramatically. Subsequently this seems to make the wound worse. 04-20-2022 upon evaluation today patient's wound is actually showing signs of excellent improvement. I am actually very pleased with where we stand today and there does not appear to be any signs of active infection locally or systemically at this time which is great news. 05-18-2022 upon evaluation today patient appears to be doing well with regard to his wound. He has been tolerating the dressing changes without complication. Fortunately I do not see any evidence of infection visually though his had increased drainage over the past week this does raise the question of infection in general. Overall I am very pleased with where things stand currently. 06-15-2022 upon evaluation today patient appears to be doing about the same in regard to his wound. He continues to have a lot of issues with shearing and friction and again I think this is a big part of what is causing this to be so hard to heal. Fortunately I do not see any signs of active infection locally or  systemically at this time. 07-13-2022 upon evaluation today patient presented for evaluation of his wound but to be honest this was superseded by the fact that his blood pressure was actually extremely elevated. Typically we do not find that his blood pressure is this high. He actually on the last few visits has been around 110/74 1 month ago 2 months ago he was 203/123 which was a bit higher and 3 months ago he was actually 191/100. With that being said today his blood pressure is actually 260/130 which is significantly more elevated than normal. He tells me that he felt dizzy and lightheaded at home he did not check his blood pressure at home but rather took a midodrine to raise his blood pressure as he felt he was likely experiencing symptoms of low blood pressure. With that being said this probably spiked his blood pressure even higher than normal. Nonetheless I am very concerned about what we see currently I think that he is at very high risk for stroke and this needs to be gotten down quite significantly. [name], [name] ([id number]) Objective Constitutional Well-nourished and well-hydrated in no acute distress. Vitals Time  Taken: 2:58 PM, Height: 72 in, Weight: 175 lbs, BMI: 23.7, Temperature: 97.8 F, Pulse: 88 bpm, Respiratory Rate: 16 breaths/min, Blood Pressure: 260/130 mmHg. Respiratory normal breathing without difficulty. Psychiatric this patient is able to make decisions and demonstrates good insight into disease process. Alert and Oriented x 3. pleasant and cooperative. General Notes: Patient again has told me that he was having some issues with dizziness and feeling lightheaded this has been intermittent but nonetheless his blood pressure is extremely elevated at this point. I do believe he needs to be seen ASAP in the ER. 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 Bathing/ Shower/ Hygiene: May shower; gently cleanse wound with antibacterial soap, rinse and pat dry prior to dressing wounds 1. I would recommend currently that the patient should go to the ER. He tells me that he feels fine to take himself there is just across the street he is going to do that. With that being said we are going to see how things proceed from what they say. 2. I am also can recommend at this time that we have the patient continue with the wound care measures as before I did not actually change anything in fact we did not even evaluate the wound due to the fact I felt like he needed to be seen ASAP at the ER. I did speak with Katie at the ER who was the triage nurse and she is aware that he is on the way. We will see patient back for reevaluation in 1 week here in the clinic. If anything worsens or changes patient will contact our office for additional recommendations. Electronic Signature(s) Signed: 07/13/2022 3:06:41 PM By: Worthy Keeler PA-C Entered By: Worthy Keeler on 07/13/2022 15:06:41 [name], [name] ([id number]) -------------------------------------------------------------------------------- SuperBill Details Patient Name: [name]. Date of Service: 07/13/2022 Medical Record Number: [id number] Patient Account Number: [id number] Date of Birth/Sex: 1971/10/20 (51 y.o. 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 I50.42 Chronic combined systolic (congestive) and diastolic (congestive) heart  failure Facility Procedures CPT4 Code: 08811031 Description: [phone] - WOUND CARE VISIT-LEV 2 EST PT Modifier: Quantity: 1 Physician Procedures CPT4 Code: [id number] Description: 62863 - WC PHYS LEVEL 5 - EST PT Modifier: Quantity: 1 CPT4 Code: Description: ICD-10 Diagnosis Description L89.153 Pressure ulcer of sacral region, stage 3 G82.20 Paraplegia, unspecified E10.621 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

## 2022-07-14 DIAGNOSIS — S31809A Unspecified open wound of unspecified buttock, initial encounter: Secondary | ICD-10-CM | POA: Diagnosis not present

## 2022-07-15 NOTE — Addendum Note (Signed)
Addended by: Lurlean Nanny on: 07/15/2022 05:01 PM   Modules accepted: Orders

## 2022-07-20 ENCOUNTER — Telehealth: Payer: Self-pay

## 2022-07-20 NOTE — Telephone Encounter (Signed)
LMTCB to see how patient was. Appears to have left ED before being seen.

## 2022-07-28 ENCOUNTER — Telehealth: Payer: Self-pay

## 2022-07-28 NOTE — Telephone Encounter (Signed)
Pt lmovm reporting that he had loose dark tarry stools with no smell yesterday, denies abd pain, N/V, fever chills... No other Sx... Please advise if pt should go to ED now

## 2022-07-29 ENCOUNTER — Other Ambulatory Visit: Payer: Self-pay | Admitting: Internal Medicine

## 2022-08-04 NOTE — Telephone Encounter (Signed)
Left message on voicemail.

## 2022-08-05 ENCOUNTER — Ambulatory Visit: Payer: Medicare Other | Admitting: Family

## 2022-08-05 DIAGNOSIS — E109 Type 1 diabetes mellitus without complications: Secondary | ICD-10-CM | POA: Diagnosis not present

## 2022-08-05 DIAGNOSIS — G8250 Quadriplegia, unspecified: Secondary | ICD-10-CM | POA: Diagnosis not present

## 2022-08-05 DIAGNOSIS — Z87440 Personal history of urinary (tract) infections: Secondary | ICD-10-CM | POA: Diagnosis not present

## 2022-08-05 DIAGNOSIS — R339 Retention of urine, unspecified: Secondary | ICD-10-CM | POA: Diagnosis not present

## 2022-08-05 NOTE — Telephone Encounter (Signed)
Left message on voicemail.

## 2022-08-11 ENCOUNTER — Encounter: Payer: Medicare Other | Attending: Physician Assistant | Admitting: Physician Assistant

## 2022-08-11 DIAGNOSIS — L89153 Pressure ulcer of sacral region, stage 3: Secondary | ICD-10-CM | POA: Diagnosis not present

## 2022-08-11 DIAGNOSIS — G8220 Paraplegia, unspecified: Secondary | ICD-10-CM | POA: Diagnosis not present

## 2022-08-11 DIAGNOSIS — E1051 Type 1 diabetes mellitus with diabetic peripheral angiopathy without gangrene: Secondary | ICD-10-CM | POA: Insufficient documentation

## 2022-08-11 DIAGNOSIS — I110 Hypertensive heart disease with heart failure: Secondary | ICD-10-CM | POA: Diagnosis not present

## 2022-08-11 DIAGNOSIS — G8254 Quadriplegia, C5-C7 incomplete: Secondary | ICD-10-CM | POA: Insufficient documentation

## 2022-08-11 DIAGNOSIS — G8929 Other chronic pain: Secondary | ICD-10-CM | POA: Diagnosis not present

## 2022-08-11 DIAGNOSIS — L987 Excessive and redundant skin and subcutaneous tissue: Secondary | ICD-10-CM | POA: Diagnosis not present

## 2022-08-11 DIAGNOSIS — I5042 Chronic combined systolic (congestive) and diastolic (congestive) heart failure: Secondary | ICD-10-CM | POA: Diagnosis not present

## 2022-08-11 DIAGNOSIS — E10621 Type 1 diabetes mellitus with foot ulcer: Secondary | ICD-10-CM | POA: Insufficient documentation

## 2022-08-11 DIAGNOSIS — E1069 Type 1 diabetes mellitus with other specified complication: Secondary | ICD-10-CM | POA: Diagnosis not present
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
## 2022-08-13 ENCOUNTER — Telehealth: Payer: Self-pay

## 2022-08-13 DIAGNOSIS — S31809A Unspecified open wound of unspecified buttock, initial encounter: Secondary | ICD-10-CM | POA: Diagnosis not present

## 2022-08-13 NOTE — Patient Outreach (Signed)
  Care Coordination   08/13/2022 Name: [name] MRN: [id number] DOB: 04-13-1971   Care Coordination Outreach Attempts:  An unsuccessful telephone outreach was attempted today to offer the patient information about available care coordination services as a benefit of their health plan.   Follow Up Plan:  Additional outreach attempts will be made to offer the patient care coordination information and services.   Encounter Outcome:  No Answer  Care Coordination Interventions Activated:  No   Care Coordination Interventions:  No, not indicated      [name], RN,BSN,CCM [facility] Telephonic Care Management Coordinator Direct Phone: [phone] Toll Free: [phone] Fax: [phone]

## 2022-08-15 ENCOUNTER — Encounter: Payer: Self-pay | Admitting: Gastroenterology

## 2022-08-17 NOTE — Progress Notes (Signed)
AMERY, MINASYAN ([id number]) Visit Report for 08/11/2022 Arrival Information Details Patient Name: [name], [name]. Date of Service: 08/11/2022 2:45 PM Medical Record Number: [id number] Patient Account Number: [id number] Date of Birth/Sex: [birth year] (51 y.o. M) Treating RN: Carlene Coria Primary Care Zhane Donlan: Einar Pheasant Other Clinician: Referring Shellia Hartl: Einar Pheasant Treating Jacalyn Biggs/Extender: Skipper Cliche in Treatment: 36 Visit Information History Since Last Visit All ordered tests and consults were completed: No Patient Arrived: Wheel Chair Added or deleted any medications: No Arrival Time: 14:44 Any new allergies or adverse reactions: No Accompanied By: self Had a fall or experienced change in No Transfer Assistance: None activities of daily living that may affect Patient Identification Verified: Yes risk of falls: Secondary Verification Process Completed: Yes Signs or symptoms of abuse/neglect since last visito No Patient Requires Transmission-Based Precautions: No Hospitalized since last visit: No Patient Has Alerts: No Implantable device outside of the clinic excluding No cellular tissue based products placed in the center since last visit: Has Dressing in Place as Prescribed: Yes Pain Present Now: No Electronic Signature(s) Signed: 08/11/2022 3:47:36 PM By: Carlene Coria RN Entered By: Carlene Coria on 08/11/2022 15:47:36 Tangen, Lahoma Rocker ([id number]) -------------------------------------------------------------------------------- Clinic Level of Care Assessment Details Patient Name: [name]. Date of Service: 08/11/2022 2:45 PM Medical Record Number: [id number] Patient Account Number: [id number] Date of Birth/Sex: 23-Feb-1971 (51 y.o. M) Treating RN: Carlene Coria Primary Care Elliemae Braman: Einar Pheasant Other Clinician: Referring Meria Crilly: Einar Pheasant Treating Cyrstal Leitz/Extender: Skipper Cliche in Treatment: 36 Clinic Level of Care Assessment  Items TOOL 4 Quantity Score X - Use when only an EandM is performed on FOLLOW-UP visit 1 0 ASSESSMENTS - Nursing Assessment / Reassessment X - Reassessment of Co-morbidities (includes updates in patient status) 1 10 X- 1 5 Reassessment of Adherence to Treatment Plan ASSESSMENTS - Wound and Skin Assessment / Reassessment X - Simple Wound Assessment / Reassessment - one wound 1 5 _0  - 0 Complex Wound Assessment / Reassessment - multiple wounds _1  - 0 Dermatologic / Skin Assessment (not related to wound area) ASSESSMENTS - Focused Assessment _2  - Circumferential Edema Measurements - multi extremities 0 _3  - 0 Nutritional Assessment / Counseling / Intervention _4  - 0 Lower Extremity Assessment (monofilament, tuning fork, pulses) _5  - 0 Peripheral Arterial Disease Assessment (using hand held doppler) ASSESSMENTS - Ostomy and/or Continence Assessment and Care _6  - Incontinence Assessment and Management 0 _7  - 0 Ostomy Care Assessment and Management (repouching, etc.) PROCESS - Coordination of Care X - Simple Patient / Family Education for ongoing care 1 15 _8  - 0 Complex (extensive) Patient / Family Education for ongoing care _9  - 0 Staff obtains Programmer, systems, Records, Test Results / Process Orders _10  - 0 Staff telephones HHA, Nursing Homes / Clarify orders / etc _11  - 0 Routine Transfer to another Facility (non-emergent condition) _12  - 0 Routine Hospital Admission (non-emergent condition) _13  - 0 New Admissions / Biomedical engineer / Ordering NPWT, Apligraf, etc. _14  - 0 Emergency Hospital Admission (emergent condition) X- 1 10 Simple Discharge Coordination _15  - 0 Complex (extensive) Discharge Coordination PROCESS - Special Needs _16  - Pediatric / Minor Patient Management 0 _17  - 0 Isolation Patient Management _18  - 0 Hearing / Language / Visual special needs _19  - 0 Assessment of Community assistance (transportation, D/C planning, etc.) _20  - 0 Additional assistance /  Altered mentation _21  - 0 Support Surface(s) Assessment (bed, cushion, seat, etc.) INTERVENTIONS - Wound Cleansing / Measurement Mazzocco, Johneric K. ([id number]) X- 1 5 Simple Wound Cleansing -  one wound _0  - 0 Complex Wound Cleansing - multiple wounds X- 1 5 Wound Imaging (photographs - any number of wounds) _1  - 0 Wound Tracing (instead of photographs) X- 1 5 Simple Wound Measurement - one wound _2  - 0 Complex Wound Measurement - multiple wounds INTERVENTIONS - Wound Dressings X - Small Wound Dressing one or multiple wounds 1 10 _3  - 0 Medium Wound Dressing one or multiple wounds _4  - 0 Large Wound Dressing one or multiple wounds <WJXBJYNWGNFAOZHY>_8<\/MVHQIONGEXBMWUXL>_2  - 0 Application of Medications - topical <GMWNUUVOZDGUYQIH>_4<\/VQQVZDGLOVFIEPPI>_9  - 0 Application of Medications - injection INTERVENTIONS - Miscellaneous _7  - External ear exam 0 _8  - 0 Specimen Collection (cultures, biopsies, blood, body fluids, etc.) _9  - 0 Specimen(s) / Culture(s) sent or taken to Lab for analysis _10  - 0 Patient Transfer (multiple staff / Civil Service fast streamer / Similar devices) _11  - 0 Simple Staple / Suture removal (25 or less) _12  - 0 Complex Staple / Suture removal (26 or more) _13  - 0 Hypo / Hyperglycemic Management (close monitor of Blood Glucose) _14  - 0 Ankle / Brachial Index (ABI) - do not check if billed separately X- 1 5 Vital Signs Has the patient been seen at the hospital within the last three years: Yes Total Score: 75 Level Of Care: New/Established - Level 2 Electronic Signature(s) Signed: 08/17/2022 8:13:04 AM By: Carlene Coria RN Entered By: Carlene Coria on 08/11/2022 15:49:44 Bozzo, Lahoma Rocker ([id number]) -------------------------------------------------------------------------------- Encounter Discharge Information Details Patient Name: [name]. Date of Service: 08/11/2022 2:45 PM Medical Record Number: [id number] Patient Account Number: [id number] Date of Birth/Sex: Jan 10, 1971 (51 y.o. M) Treating RN: Carlene Coria Primary Care Latrish Mogel:  Einar Pheasant Other Clinician: Referring Bernice Mcauliffe: Einar Pheasant Treating Giamarie Bueche/Extender: Skipper Cliche in Treatment: 36 Encounter Discharge Information Items Discharge Condition: Stable Ambulatory Status: Wheelchair Discharge Destination: Home Transportation: Private Auto Accompanied By: self Schedule Follow-up Appointment: Yes Clinical Summary of Care: Patient Declined Electronic Signature(s) Signed: 08/11/2022 3:50:44 PM By: Carlene Coria RN Entered By: Carlene Coria on 08/11/2022 15:50:44 Kissoon, Lahoma Rocker ([id number]) -------------------------------------------------------------------------------- Lower Extremity Assessment Details Patient Name: [name]. Date of Service: 08/11/2022 2:45 PM Medical Record Number: [id number] Patient Account Number: [id number] Date of Birth/Sex: 1971-02-19 (51 y.o. M) Treating RN: Carlene Coria Primary Care Safiyyah Vasconez: Einar Pheasant Other Clinician: Referring Malloree Raboin: Einar Pheasant Treating Melton Walls/Extender: Jeri Cos Weeks in Treatment: 36 Electronic Signature(s) Signed: 08/17/2022 8:13:04 AM By: Carlene Coria RN Entered By: Carlene Coria on 08/11/2022 15:02:11 Schmuhl, Lahoma Rocker ([id number]) -------------------------------------------------------------------------------- Multi Wound Chart Details Patient Name: [name]. Date of Service: 08/11/2022 2:45 PM Medical Record Number: [id number] Patient Account Number: [id number] Date of Birth/Sex: November 11, 1971 (51 y.o. M) Treating RN: Carlene Coria Primary Care Elyza Whitt: Einar Pheasant Other Clinician: Referring Gazella Anglin: Einar Pheasant Treating Milarose Savich/Extender: Skipper Cliche in Treatment: 36 Vital Signs Height(in): 72 Pulse(bpm): 64 Weight(lbs): 175 Blood Pressure(mmHg): 218/126 Body Mass Index(BMI): 23.7 Temperature(F): 98 Respiratory Rate(breaths/min): 18 Photos: [N/A:N/A] Wound Location: Midline Sacrum N/A N/A Wounding Event: Gradually Appeared N/A  N/A Primary Etiology: Pressure Ulcer N/A N/A Comorbid History: Congestive Heart Failure, Type I N/A N/A Diabetes, History of pressure wounds, Paraplegia Date Acquired: 05/07/2021 N/A N/A Weeks of Treatment: 36 N/A N/A Wound Status: Open N/A N/A Wound Recurrence: No N/A N/A Measurements L x W x D (cm) 3x2x1 N/A N/A Area (cm) : 4.712 N/A N/A Volume (cm) : 4.712 N/A N/A % Reduction in Area: 14.30% N/A N/A % Reduction in Volume: -114.30% N/A N/A Classification: Category/Stage III N/A N/A Exudate Amount: Medium N/A  N/A Exudate Type: Serosanguineous N/A N/A Exudate Color: red, brown N/A N/A Wound Margin: Thickened N/A N/A Granulation Amount: Large (67-100%) N/A N/A Granulation Quality: Red N/A N/A Necrotic Amount: None Present (0%) N/A N/A Exposed Structures: Fat Layer (Subcutaneous Tissue): N/A N/A Yes Fascia: No Tendon: No Muscle: No Joint: No Bone: No Epithelialization: None N/A N/A Treatment Notes Electronic Signature(s) Signed: 08/17/2022 8:13:04 AM By: Carlene Coria RN Entered By: Carlene Coria on 08/11/2022 15:02:22 Menzer, Lahoma Rocker ([id number]) -------------------------------------------------------------------------------- [place] Details Patient Name: [name]. Date of Service: 08/11/2022 2:45 PM Medical Record Number: [id number] Patient Account Number: [id number] Date of Birth/Sex: 05-20-1971 (51 y.o. M) Treating RN: Carlene Coria Primary Care Tynesha Free: Einar Pheasant Other Clinician: Referring Bassy Fetterly: Einar Pheasant Treating Danyell Shader/Extender: Skipper Cliche in Treatment: 36 Active Inactive Wound/Skin Impairment Nursing Diagnoses: Knowledge deficit related to ulceration/compromised skin integrity Goals: Patient/caregiver will verbalize understanding of skin care regimen Date Initiated: 11/27/2021 Target Resolution Date: 12/28/2021 Goal Status: Active Ulcer/skin breakdown will have a volume reduction of 30% by week 4 Date  Initiated: 11/27/2021 Target Resolution Date: 01/28/2022 Goal Status: Active Ulcer/skin breakdown will have a volume reduction of 50% by week 8 Date Initiated: 11/27/2021 Target Resolution Date: 02/25/2022 Goal Status: Active Ulcer/skin breakdown will have a volume reduction of 80% by week 12 Date Initiated: 11/27/2021 Target Resolution Date: 03/28/2022 Goal Status: Active Ulcer/skin breakdown will heal within 14 weeks Date Initiated: 11/27/2021 Target Resolution Date: 04/27/2022 Goal Status: Active Interventions: Assess patient/caregiver ability to obtain necessary supplies Assess patient/caregiver ability to perform ulcer/skin care regimen upon admission and as needed Assess ulceration(s) every visit Notes: Electronic Signature(s) Signed: 08/17/2022 8:13:04 AM By: Carlene Coria RN Entered By: Carlene Coria on 08/11/2022 15:02:15 Kirlin, Lahoma Rocker ([id number]) -------------------------------------------------------------------------------- Pain Assessment Details Patient Name: [name]. Date of Service: 08/11/2022 2:45 PM Medical Record Number: [id number] Patient Account Number: [id number] Date of Birth/Sex: 07/09/1971 (52 y.o. M) Treating RN: Carlene Coria Primary Care Kenlyn Lose: Einar Pheasant Other Clinician: Referring Geralyn Figiel: Einar Pheasant Treating Henretter Piekarski/Extender: Skipper Cliche in Treatment: 36 Active Problems Location of Pain Severity and Description of Pain Patient Has Paino No Site Locations Pain Management and Medication Current Pain Management: Electronic Signature(s) Signed: 08/17/2022 8:13:04 AM By: Carlene Coria RN Entered By: Carlene Coria on 08/11/2022 14:50:47 Amiri, Lahoma Rocker ([id number]) -------------------------------------------------------------------------------- Patient/Caregiver Education Details Patient Name: [name]. Date of Service: 08/11/2022 2:45 PM Medical Record Number: [id number] Patient Account Number: [id number] Date of  Birth/Gender: 1971-09-26 (51 y.o. M) Treating RN: Carlene Coria Primary Care Physician: Einar Pheasant Other Clinician: Referring Physician: Einar Pheasant Treating Physician/Extender: Skipper Cliche in Treatment: 27 Education Assessment Education Provided To: Patient Education Topics Provided Wound/Skin Impairment: Methods: Explain/Verbal Responses: State content correctly Electronic Signature(s) Signed: 08/17/2022 8:13:04 AM By: Carlene Coria RN Entered By: Carlene Coria on 08/11/2022 15:50:00 [place], Lahoma Rocker ([id number]) -------------------------------------------------------------------------------- Wound Assessment Details Patient Name: [name]. Date of Service: 08/11/2022 2:45 PM Medical Record Number: [id number] Patient Account Number: [id number] Date of Birth/Sex: [birth year] (51 y.o. M) Treating RN: Carlene Coria Primary Care Teryn Boerema: Einar Pheasant Other Clinician: Referring Una Yeomans: Einar Pheasant Treating Toye Rouillard/Extender: Skipper Cliche in Treatment: 36 Wound Status Wound Number: 8 Primary Pressure Ulcer Etiology: Wound Location: Midline Sacrum Wound Status: Open Wounding Event: Gradually Appeared Comorbid Congestive Heart Failure, Type I Diabetes, History of Date Acquired: 05/07/2021 History: pressure wounds, Paraplegia Weeks Of Treatment: 36 Clustered Wound: No Photos Wound Measurements Length: (cm) 3 Width: (cm) 2 Depth: (cm) 1 Area: (cm) 4.712 Volume: (cm)  4.712 % Reduction in Area: 14.3% % Reduction in Volume: -114.3% Epithelialization: None Tunneling: No Undermining: No Wound Description Classification: Category/Stage III Wound Margin: Thickened Exudate Amount: Medium Exudate Type: Serosanguineous Exudate Color: red, brown Foul Odor After Cleansing: No Slough/Fibrino No Wound Bed Granulation Amount: Large (67-100%) Exposed Structure Granulation Quality: Red Fascia Exposed: No Necrotic Amount: None Present (0%) Fat Layer  (Subcutaneous Tissue) Exposed: Yes Tendon Exposed: No Muscle Exposed: No Joint Exposed: No Bone Exposed: No Treatment Notes Wound #8 (Sacrum) Wound Laterality: Midline Cleanser [place] Ancillary Kit - 15 Day Supply Discharge Instruction: Use supplies as instructed; Kit contains: (15) Saline Bullets; (15) 3x3 Gauze; 15 pr Gloves Soap and Water [place], BRAYDN CARNEIRO ([id number]) Discharge Instruction: Gently cleanse wound with antibacterial soap, rinse and pat dry prior to dressing wounds Peri-Wound Care Topical Primary Dressing Silvercel Small 2x2 (in/in) Discharge Instruction: Apply Silvercel Small 2x2 (in/in) as instructed Secondary Dressing (BORDER) Zetuvit Plus SILICONE BORDER Dressing 4x4 (in/in) Secured With Compression Wrap Compression Stockings Add-Ons Electronic Signature(s) Signed: 08/17/2022 8:13:04 AM By: Carlene Coria RN Entered By: Carlene Coria on 08/11/2022 15:01:59 Joerger, Lahoma Rocker ([id number]) -------------------------------------------------------------------------------- Vitals Details Patient Name: [name]. Date of Service: 08/11/2022 2:45 PM Medical Record Number: [id number] Patient Account Number: [id number] Date of Birth/Sex: [birth year] (51 y.o. M) Treating RN: Carlene Coria Primary Care Latanya Hemmer: Einar Pheasant Other Clinician: Referring Chrsitopher Wik: Einar Pheasant Treating Tegh Franek/Extender: Skipper Cliche in Treatment: 36 Vital Signs Time Taken: 14:50 Temperature (F): 98 Height (in): 72 Pulse (bpm): 64 Weight (lbs): 175 Respiratory Rate (breaths/min): 18 Body Mass Index (BMI): 23.7 Blood Pressure (mmHg): 218/126 Reference Range: 80 - 120 mg / dl Electronic Signature(s) Signed: 08/17/2022 8:13:04 AM By: Carlene Coria RN Entered By: Carlene Coria on 08/11/2022 14:50:37

## 2022-08-18 ENCOUNTER — Telehealth: Payer: Self-pay

## 2022-08-18 NOTE — Patient Outreach (Signed)
  Care Coordination   Initial Visit Note   08/18/2022 Name: [name] MRN: [id number] DOB: Jan 06, 1971  [name] is a 51 y.o. year old male who sees Einar Pheasant, MD for primary care. I spoke with  Jannifer Rodney Waynick by phone today.  What matters to the patients health and wellness today?  RN CM attempted to complete assessment and assess for [facility] needs or concerns but patient declined to continue with call.     Goals Addressed             This Visit's Progress    COMPLETED: Care Coordination-no follow up required       Care Coordination Interventions: Assessed social determinant of health barriers Patient declined to complete call and any further intervention          SDOH assessments and interventions completed:  Yes  SDOH Interventions Today    Flowsheet Row Most Recent Value  SDOH Interventions   Transportation Interventions Intervention Not Indicated        Care Coordination Interventions Activated:  Yes  Care Coordination Interventions:  Yes, provided   Follow up plan: No further intervention required.   Encounter Outcome:  Pt. Visit Completed   [name], RN,BSN,CCM [place] [facility] Telephonic Care Management Coordinator Direct Phone: [phone] Toll Free: [phone] Fax: [phone]

## 2022-08-21 DIAGNOSIS — R339 Retention of urine, unspecified: Secondary | ICD-10-CM | POA: Diagnosis not present

## 2022-08-21 DIAGNOSIS — G8250 Quadriplegia, unspecified: Secondary | ICD-10-CM | POA: Diagnosis not present

## 2022-08-21 DIAGNOSIS — E109 Type 1 diabetes mellitus without complications: Secondary | ICD-10-CM | POA: Diagnosis not present

## 2022-08-24 ENCOUNTER — Other Ambulatory Visit: Payer: Self-pay | Admitting: Gastroenterology

## 2022-08-24 MED ORDER — NA SULFATE-K SULFATE-MG SULF 17.5-3.13-1.6 GM/177ML PO SOLN: 1.0000 | Freq: Once | ORAL | 0 refills | Status: AC

## 2022-08-24 NOTE — Addendum Note (Signed)
Addended by: Lurlean Nanny on: 08/24/2022 04:40 PM   Modules accepted: Orders

## 2022-08-26 ENCOUNTER — Encounter: Payer: Self-pay | Admitting: Gastroenterology

## 2022-08-27 ENCOUNTER — Encounter
Admission: RE | Disposition: A | Discharge status: Home / Self Care | Payer: Self-pay | Source: Home / Self Care | Attending: Gastroenterology

## 2022-08-27 ENCOUNTER — Ambulatory Visit: Payer: Medicare Other | Admitting: Anesthesiology

## 2022-08-27 ENCOUNTER — Encounter: Payer: Self-pay | Admitting: Gastroenterology

## 2022-08-27 ENCOUNTER — Ambulatory Visit
Admission: RE | Admit: 2022-08-27 | Discharge: 2022-08-27 | Disposition: A | Discharge status: Home / Self Care | Payer: Medicare Other | Attending: Gastroenterology | Admitting: Gastroenterology

## 2022-08-27 DIAGNOSIS — K5909 Other constipation: Secondary | ICD-10-CM

## 2022-08-27 DIAGNOSIS — G8250 Quadriplegia, unspecified: Secondary | ICD-10-CM | POA: Diagnosis not present

## 2022-08-27 DIAGNOSIS — R194 Change in bowel habit: Secondary | ICD-10-CM

## 2022-08-27 DIAGNOSIS — I509 Heart failure, unspecified: Secondary | ICD-10-CM | POA: Diagnosis not present

## 2022-08-27 DIAGNOSIS — K529 Noninfective gastroenteritis and colitis, unspecified: Secondary | ICD-10-CM | POA: Insufficient documentation

## 2022-08-27 DIAGNOSIS — K219 Gastro-esophageal reflux disease without esophagitis: Secondary | ICD-10-CM | POA: Diagnosis not present

## 2022-08-27 DIAGNOSIS — K5120 Ulcerative (chronic) proctitis without complications: Secondary | ICD-10-CM | POA: Diagnosis not present

## 2022-08-27 DIAGNOSIS — E109 Type 1 diabetes mellitus without complications: Secondary | ICD-10-CM | POA: Diagnosis not present

## 2022-08-27 DIAGNOSIS — Z794 Long term (current) use of insulin: Secondary | ICD-10-CM | POA: Diagnosis not present

## 2022-08-27 DIAGNOSIS — F17220 Nicotine dependence, chewing tobacco, uncomplicated: Secondary | ICD-10-CM | POA: Diagnosis not present

## 2022-08-27 DIAGNOSIS — Z87440 Personal history of urinary (tract) infections: Secondary | ICD-10-CM | POA: Insufficient documentation

## 2022-08-27 DIAGNOSIS — K6289 Other specified diseases of anus and rectum: Secondary | ICD-10-CM | POA: Diagnosis not present

## 2022-08-27 HISTORY — PX: COLONOSCOPY WITH PROPOFOL: SHX5780

## 2022-08-27 LAB — GLUCOSE, CAPILLARY: Glucose-Capillary: 133 mg/dL — ABNORMAL HIGH (ref 70–99)

## 2022-08-27 SURGERY — COLONOSCOPY WITH PROPOFOL
Anesthesia: General

## 2022-08-27 MED ORDER — SODIUM CHLORIDE 0.9 % IV SOLN
INTRAVENOUS | Status: DC
  Administered 2022-08-27: 1000 mL via INTRAVENOUS

## 2022-08-27 MED ORDER — PROPOFOL 10 MG/ML IV BOLUS
INTRAVENOUS | Status: DC | PRN
  Administered 2022-08-27: 80 mg via INTRAVENOUS

## 2022-08-27 MED ORDER — PROPOFOL 500 MG/50ML IV EMUL
INTRAVENOUS | Status: DC | PRN
  Administered 2022-08-27: 140 ug/kg/min via INTRAVENOUS
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
## 2022-08-27 NOTE — Op Note (Signed)
[facility] Patient Name: [name] Procedure Date: 08/27/2022 7:45 AM MRN: [id number] Account #: [id number] Date of Birth: October 03, 1971 Admit Type: Outpatient Age: 51 Room: [facility] ENDO ROOM 4 Gender: Male Note Status: Finalized Instrument Name: Jasper Riling [id number] Procedure:             Colonoscopy Indications:           Chronic diarrhea, Incidental change in bowel habits                         noted Providers:             [name] MD, MD Referring MD:          Einar Pheasant, MD (Referring MD) Medicines:             Propofol per Anesthesia Complications:         No immediate complications. Procedure:             Pre-Anesthesia Assessment:                        - Prior to the procedure, a History and Physical was                         performed, and patient medications and allergies were                         reviewed. The patient's tolerance of previous                         anesthesia was also reviewed. The risks and benefits                         of the procedure and the sedation options and risks                         were discussed with the patient. All questions were                         answered, and informed consent was obtained. Prior                         Anticoagulants: The patient has taken no previous                         anticoagulant or antiplatelet agents. ASA Grade                         Assessment: II - A patient with mild systemic disease.                         After reviewing the risks and benefits, the patient                         was deemed in satisfactory condition to undergo the                         procedure.  After obtaining informed consent, the colonoscope was                         passed under direct vision. Throughout the procedure,                         the patient's blood pressure, pulse, and oxygen                         saturations were monitored  continuously. The                         Colonoscope was introduced through the anus and                         advanced to the the terminal ileum. The colonoscopy                         was performed without difficulty. The patient                         tolerated the procedure well. Findings:      The perianal and digital rectal examinations were normal.      The terminal ileum appeared normal. Biopsies were taken with a cold       forceps for histology.      Localized moderate inflammation characterized by serpentine ulcerations       was found in the rectum. Biopsies were taken with a cold forceps for       histology.      Random biopsies were obtained with cold forceps for histology randomly       in the entire colon. Impression:            - The examined portion of the ileum was normal.                         Biopsied.                        - Localized moderate inflammation was found in the                         rectum secondary to proctitis. Biopsied.                        - Random biopsies were obtained in the entire colon. Recommendation:        - Discharge patient to home.                        - Resume previous diet.                        - Continue present medications.                        - Await pathology results. Procedure Code(s):     --- Professional ---                        [phone], Colonoscopy, flexible; with biopsy, single or  multiple Diagnosis Code(s):     --- Professional ---                        K52.9, Noninfective gastroenteritis and colitis,                         unspecified                        K62.89, Other specified diseases of anus and rectum CPT copyright 2019 American Medical Association. All rights reserved. The codes documented in this report are preliminary and upon coder review may  be revised to meet current compliance requirements. [name] MD, MD 08/27/2022 8:08:43 AM This report has been signed  electronically. Number of Addenda: 0 Note Initiated On: 08/27/2022 7:45 AM Scope Withdrawal Time: 0 hours 9 minutes 23 seconds  Total Procedure Duration: 0 hours 13 minutes 41 seconds  Estimated Blood Loss:  Estimated blood loss: none.      [facility]
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
## 2022-08-27 NOTE — Transfer of Care (Signed)
Immediate Anesthesia Transfer of Care Note  Patient: [name]  Procedure(s) Performed: COLONOSCOPY WITH PROPOFOL  Patient Location: PACU  Anesthesia Type:General  Level of Consciousness: awake, alert  and oriented  Airway & Oxygen Therapy: Patient Spontanous Breathing  Post-op Assessment: Report given to RN and Post -op Vital signs reviewed and stable  Post vital signs: Reviewed and stable  Last Vitals:  Vitals Value Taken Time  BP 155/80 08/27/22 0810  Temp    Pulse 50 08/27/22 0810  Resp 16 08/27/22 0810  SpO2 100 % 08/27/22 0810    Last Pain:  Vitals:   08/27/22 0810  TempSrc:   PainSc: Asleep         Complications: No notable events documented.

## 2022-08-27 NOTE — Anesthesia Postprocedure Evaluation (Signed)
Anesthesia Post Note  Patient: [name]  Procedure(s) Performed: COLONOSCOPY WITH PROPOFOL  Patient location during evaluation: PACU Anesthesia Type: General Level of consciousness: awake Pain management: satisfactory to patient Vital Signs Assessment: post-procedure vital signs reviewed and stable Respiratory status: spontaneous breathing and respiratory function stable Cardiovascular status: stable Anesthetic complications: no   No notable events documented.   Last Vitals:  Vitals:   08/27/22 0810 08/27/22 0820  BP: (!) 155/80 (!) 153/82  Pulse: (!) 50 (!) 50  Resp: 16 (!) 9  Temp:    SpO2: 100% 99%    Last Pain:  Vitals:   08/27/22 0820  TempSrc:   PainSc: Asleep                 VAN STAVEREN,Melodye Swor

## 2022-08-28 ENCOUNTER — Encounter: Payer: Self-pay | Admitting: Gastroenterology

## 2022-08-31 LAB — SURGICAL PATHOLOGY

## 2022-09-01 DIAGNOSIS — E109 Type 1 diabetes mellitus without complications: Secondary | ICD-10-CM | POA: Diagnosis not present

## 2022-09-01 DIAGNOSIS — Z794 Long term (current) use of insulin: Secondary | ICD-10-CM | POA: Diagnosis not present

## 2022-09-03 ENCOUNTER — Encounter: Payer: Self-pay | Admitting: Gastroenterology

## 2022-09-07 ENCOUNTER — Ambulatory Visit: Payer: Medicare Other | Admitting: Physician Assistant

## 2022-09-09 ENCOUNTER — Other Ambulatory Visit: Payer: Self-pay | Admitting: Internal Medicine

## 2022-09-11 DIAGNOSIS — L89153 Pressure ulcer of sacral region, stage 3: Secondary | ICD-10-CM | POA: Diagnosis not present

## 2022-09-14 ENCOUNTER — Encounter: Payer: Medicare Other | Attending: Physician Assistant | Admitting: Physician Assistant

## 2022-09-14 DIAGNOSIS — E10621 Type 1 diabetes mellitus with foot ulcer: Secondary | ICD-10-CM | POA: Diagnosis not present

## 2022-09-14 DIAGNOSIS — I110 Hypertensive heart disease with heart failure: Secondary | ICD-10-CM | POA: Diagnosis not present

## 2022-09-14 DIAGNOSIS — L89153 Pressure ulcer of sacral region, stage 3: Secondary | ICD-10-CM | POA: Insufficient documentation

## 2022-09-14 DIAGNOSIS — G8220 Paraplegia, unspecified: Secondary | ICD-10-CM | POA: Insufficient documentation

## 2022-09-14 DIAGNOSIS — I5042 Chronic combined systolic (congestive) and diastolic (congestive) heart failure: Secondary | ICD-10-CM | POA: Insufficient documentation

## 2022-09-14 NOTE — Progress Notes (Signed)
VIRGILIO, BROADHEAD ([id number]) Visit Report for 09/14/2022 Arrival Information Details Patient Name: [name], [name]. Date of Service: 09/14/2022 10:45 AM Medical Record Number: [id number] Patient Account Number: [id number] Date of Birth/Sex: 23-Aug-1971 (51 y.o. M) Treating RN: Cornell Barman Primary Care Arlyn Buerkle: Einar Pheasant Other Clinician: Referring Demarius [name]: Einar Pheasant Treating Xander Jutras/Extender: Skipper Cliche in Treatment: 78 Visit Information History Since Last Visit Added or deleted any medications: No Patient Arrived: Wheel Chair Has Dressing in Place as Prescribed: Yes Arrival Time: 11:24 Pain Present Now: No Accompanied By: self Transfer Assistance: Manual Patient Identification Verified: Yes Secondary Verification Process Completed: Yes Patient Requires Transmission-Based Precautions: No Patient Has Alerts: No Electronic Signature(s) Signed: 09/14/2022 6:29:15 PM By: Gretta Cool, BSN, RN, CWS, Kim RN, BSN Entered By: Gretta Cool, BSN, RN, CWS, Kim on 09/14/2022 11:50:04 Saha, [name] ([id number]) -------------------------------------------------------------------------------- Clinic Level of Care Assessment Details Patient Name: [name]. Date of Service: 09/14/2022 10:45 AM Medical Record Number: [id number] Patient Account Number: [id number] Date of Birth/Sex: [birth year] (51 y.o. M) Treating RN: Cornell Barman Primary Care Javyn Havlin: Einar Pheasant Other Clinician: Referring Alliyah Roesler: Einar Pheasant Treating Cardarius Senat/Extender: Skipper Cliche in Treatment: 54 Clinic Level of Care Assessment Items TOOL 4 Quantity Score []  - Use when only an EandM is performed on FOLLOW-UP visit 0 ASSESSMENTS - Nursing Assessment / Reassessment X - Reassessment of Co-morbidities (includes updates in patient status) 1 10 X- 1 5 Reassessment of Adherence to Treatment Plan ASSESSMENTS - Wound and Skin Assessment / Reassessment X - Simple Wound Assessment / Reassessment - one  wound 1 5 []  - 0 Complex Wound Assessment / Reassessment - multiple wounds []  - 0 Dermatologic / Skin Assessment (not related to wound area) ASSESSMENTS - Focused Assessment []  - Circumferential Edema Measurements - multi extremities 0 []  - 0 Nutritional Assessment / Counseling / Intervention []  - 0 Lower Extremity Assessment (monofilament, tuning fork, pulses) []  - 0 Peripheral Arterial Disease Assessment (using hand held doppler) ASSESSMENTS - Ostomy and/or Continence Assessment and Care []  - Incontinence Assessment and Management 0 []  - 0 Ostomy Care Assessment and Management (repouching, etc.) PROCESS - Coordination of Care X - Simple Patient / Family Education for ongoing care 1 15 []  - 0 Complex (extensive) Patient / Family Education for ongoing care X- 1 10 Staff obtains Consents, Records, Test Results / Process Orders []  - 0 Staff telephones HHA, Nursing Homes / Clarify orders / etc []  - 0 Routine Transfer to another Facility (non-emergent condition) []  - 0 Routine Hospital Admission (non-emergent condition) []  - 0 New Admissions / Biomedical engineer / Ordering NPWT, Apligraf, etc. []  - 0 Emergency Hospital Admission (emergent condition) X- 1 10 Simple Discharge Coordination []  - 0 Complex (extensive) Discharge Coordination PROCESS - Special Needs []  - Pediatric / Minor Patient Management 0 []  - 0 Isolation Patient Management []  - 0 Hearing / Language / Visual special needs []  - 0 Assessment of Community assistance (transportation, D/C planning, etc.) []  - 0 Additional assistance / Altered mentation []  - 0 Support Surface(s) Assessment (bed, cushion, seat, etc.) INTERVENTIONS - Wound Cleansing / Measurement Aderman, [name] ([id number]) X- 1 5 Simple Wound Cleansing - one wound []  - 0 Complex Wound Cleansing - multiple wounds X- 1 5 Wound Imaging (photographs - any number of wounds) []  - 0 Wound Tracing (instead of photographs) X- 1 5 Simple  Wound Measurement - one wound []  - 0 Complex Wound Measurement - multiple wounds INTERVENTIONS - Wound Dressings []  - Small Wound Dressing one or  multiple wounds 0 X- 1 15 Medium Wound Dressing one or multiple wounds []  - 0 Large Wound Dressing one or multiple wounds []  - 0 Application of Medications - topical []  - 0 Application of Medications - injection INTERVENTIONS - Miscellaneous []  - External ear exam 0 []  - 0 Specimen Collection (cultures, biopsies, blood, body fluids, etc.) []  - 0 Specimen(s) / Culture(s) sent or taken to Lab for analysis []  - 0 Patient Transfer (multiple staff / Civil Service fast streamer / Similar devices) []  - 0 Simple Staple / Suture removal (25 or less) []  - 0 Complex Staple / Suture removal (26 or more) []  - 0 Hypo / Hyperglycemic Management (close monitor of Blood Glucose) []  - 0 Ankle / Brachial Index (ABI) - do not check if billed separately X- 1 5 Vital Signs Has the patient been seen at the hospital within the last three years: Yes Total Score: 90 Level Of Care: New/Established - Level 3 Electronic Signature(s) Signed: 09/14/2022 6:29:15 PM By: Gretta Cool, BSN, RN, CWS, Kim RN, BSN Entered By: Gretta Cool, BSN, RN, CWS, Kim on 09/14/2022 11:45:27 Oppenheimer, [name] ([id number]) -------------------------------------------------------------------------------- Encounter Discharge Information Details Patient Name: [name]. Date of Service: 09/14/2022 10:45 AM Medical Record Number: [id number] Patient Account Number: [id number] Date of Birth/Sex: 08-23-1971 (51 y.o. M) Treating RN: Cornell Barman Primary Care Valerie Cones: Einar Pheasant Other Clinician: Referring Amel Kitch: Einar Pheasant Treating Christien Frankl/Extender: Skipper Cliche in Treatment: 50 Encounter Discharge Information Items Discharge Condition: Stable Ambulatory Status: Ambulatory Discharge Destination: Home Transportation: Private Auto Schedule Follow-up Appointment: Yes Clinical Summary of  Care: Electronic Signature(s) Signed: 09/14/2022 6:29:15 PM By: Gretta Cool, BSN, RN, CWS, Kim RN, BSN Entered By: Gretta Cool, BSN, RN, CWS, Kim on 09/14/2022 11:48:00 Akerson, [name] ([id number]) -------------------------------------------------------------------------------- Lower Extremity Assessment Details Patient Name: [name]. Date of Service: 09/14/2022 10:45 AM Medical Record Number: [id number] Patient Account Number: [id number] Date of Birth/Sex: 1971-11-17 (51 y.o. M) Treating RN: Cornell Barman Primary Care Jeremy Ditullio: Einar Pheasant Other Clinician: Referring Chanette Demo: Einar Pheasant Treating Laquana Villari/Extender: Skipper Cliche in Treatment: 7 Electronic Signature(s) Signed: 09/14/2022 6:29:15 PM By: Gretta Cool, BSN, RN, CWS, Kim RN, BSN Entered By: Gretta Cool, BSN, RN, CWS, Kim on 09/14/2022 11:41:20 Whitham, [name] ([id number]) -------------------------------------------------------------------------------- Multi Wound Chart Details Patient Name: [name]. Date of Service: 09/14/2022 10:45 AM Medical Record Number: [id number] Patient Account Number: [id number] Date of Birth/Sex: 25-Sep-1971 (51 y.o. M) Treating RN: Cornell Barman Primary Care Frederik Standley: Einar Pheasant Other Clinician: Referring Meaghen Vecchiarelli: Einar Pheasant Treating Sarajane Fambrough/Extender: Skipper Cliche in Treatment: 74 Vital Signs Height(in): 72 Pulse(bpm): 60 Weight(lbs): 175 Blood Pressure(mmHg): 93/57 Body Mass Index(BMI): 23.7 Temperature(F): 97.5 Respiratory Rate(breaths/min): 16 Photos: [N/A:N/A] Wound Location: Midline Sacrum N/A N/A Wounding Event: Gradually Appeared N/A N/A Primary Etiology: Pressure Ulcer N/A N/A Comorbid History: Congestive Heart Failure, Type I N/A N/A Diabetes, History of pressure wounds, Paraplegia Date Acquired: 05/07/2021 N/A N/A Weeks of Treatment: 41 N/A N/A Wound Status: Open N/A N/A Wound Recurrence: No N/A N/A Measurements L x W x D (cm) 1.5x1.2x1 N/A N/A Area (cm) :  1.414 N/A N/A Volume (cm) : 1.414 N/A N/A % Reduction in Area: 74.30% N/A N/A % Reduction in Volume: 35.70% N/A N/A Classification: Category/Stage III N/A N/A Exudate Amount: Medium N/A N/A Exudate Type: Serosanguineous N/A N/A Exudate Color: red, brown N/A N/A Wound Margin: Thickened N/A N/A Granulation Amount: Large (67-100%) N/A N/A Granulation Quality: Red N/A N/A Necrotic Amount: None Present (0%) N/A N/A Exposed Structures: Fat Layer (Subcutaneous Tissue): N/A N/A Yes  Fascia: No Tendon: No Muscle: No Joint: No Bone: No Epithelialization: None N/A N/A Treatment Notes Electronic Signature(s) Signed: 09/14/2022 6:29:15 PM By: Gretta Cool, BSN, RN, CWS, Kim RN, BSN Entered By: Gretta Cool, BSN, RN, CWS, Kim on 09/14/2022 11:41:31 Simpson, [name] ([id number]) -------------------------------------------------------------------------------- [place] Details Patient Name: [name]. Date of Service: 09/14/2022 10:45 AM Medical Record Number: [id number] Patient Account Number: [id number] Date of Birth/Sex: [birth year] (51 y.o. M) Treating RN: Cornell Barman Primary Care Amadou Katzenstein: Einar Pheasant Other Clinician: Referring Ovella Manygoats: Einar Pheasant Treating Sebrina Kessner/Extender: Skipper Cliche in Treatment: 37 Active Inactive Wound/Skin Impairment Nursing Diagnoses: Knowledge deficit related to ulceration/compromised skin integrity Goals: Patient/caregiver will verbalize understanding of skin care regimen Date Initiated: 11/27/2021 Target Resolution Date: 12/28/2021 Goal Status: Active Ulcer/skin breakdown will have a volume reduction of 30% by week 4 Date Initiated: 11/27/2021 Target Resolution Date: 01/28/2022 Goal Status: Active Ulcer/skin breakdown will have a volume reduction of 50% by week 8 Date Initiated: 11/27/2021 Target Resolution Date: 02/25/2022 Goal Status: Active Ulcer/skin breakdown will have a volume reduction of 80% by week 12 Date Initiated:  11/27/2021 Target Resolution Date: 03/28/2022 Goal Status: Active Ulcer/skin breakdown will heal within 14 weeks Date Initiated: 11/27/2021 Target Resolution Date: 04/27/2022 Goal Status: Active Interventions: Assess patient/caregiver ability to obtain necessary supplies Assess patient/caregiver ability to perform ulcer/skin care regimen upon admission and as needed Assess ulceration(s) every visit Notes: Electronic Signature(s) Signed: 09/14/2022 6:29:15 PM By: Gretta Cool, BSN, RN, CWS, Kim RN, BSN Entered By: Gretta Cool, BSN, RN, CWS, Kim on 09/14/2022 11:41:25 Busby, [name] ([id number]) -------------------------------------------------------------------------------- Pain Assessment Details Patient Name: [name]. Date of Service: 09/14/2022 10:45 AM Medical Record Number: [id number] Patient Account Number: [id number] Date of Birth/Sex: 20-Dec-1970 (51 y.o. M) Treating RN: Cornell Barman Primary Care Averil Digman: Einar Pheasant Other Clinician: Referring Maleigh Bagot: Einar Pheasant Treating Caileb Rhue/Extender: Skipper Cliche in Treatment: 4 Active Problems Location of Pain Severity and Description of Pain Patient Has Paino Yes Site Locations Rate the pain. Current Pain Level: 3 Pain Management and Medication Current Pain Management: Electronic Signature(s) Signed: 09/14/2022 6:29:15 PM By: Gretta Cool, BSN, RN, CWS, Kim RN, BSN Entered By: Gretta Cool, BSN, RN, CWS, Kim on 09/14/2022 11:29:05 Shain, [name] ([id number]) -------------------------------------------------------------------------------- Patient/Caregiver Education Details Patient Name: [name] Date of Service: 09/14/2022 10:45 AM Medical Record Number: [id number] Patient Account Number: [id number] Date of Birth/Gender: 11-Feb-1971 (51 y.o. M) Treating RN: Cornell Barman Primary Care Physician: Einar Pheasant Other Clinician: Referring Physician: Einar Pheasant Treating Physician/Extender: Skipper Cliche in Treatment:  54 Education Assessment Education Provided To: Patient Education Topics Provided Wound/Skin Impairment: Handouts: Caring for Your Ulcer, Other: continue wound care as prescribed Electronic Signature(s) Signed: 09/14/2022 6:29:15 PM By: Gretta Cool, BSN, RN, CWS, Kim RN, BSN Entered By: Gretta Cool, BSN, RN, CWS, Kim on 09/14/2022 11:47:37 Moist, [name] ([id number]) -------------------------------------------------------------------------------- Wound Assessment Details Patient Name: [name]. Date of Service: 09/14/2022 10:45 AM Medical Record Number: [id number] Patient Account Number: [id number] Date of Birth/Sex: 10-03-1971 (51 y.o. M) Treating RN: Cornell Barman Primary Care Jabar Krysiak: Einar Pheasant Other Clinician: Referring Chole Driver: Einar Pheasant Treating Ronan Duecker/Extender: Skipper Cliche in Treatment: 41 Wound Status Wound Number: 8 Primary Pressure Ulcer Etiology: Wound Location: Midline Sacrum Wound Status: Open Wounding Event: Gradually Appeared Comorbid Congestive Heart Failure, Type I Diabetes, History of Date Acquired: 05/07/2021 History: pressure wounds, Paraplegia Weeks Of Treatment: 41 Clustered Wound: No Photos Wound Measurements Length: (cm) 1.5 Width: (cm) 1.2 Depth: (cm) 1 Area: (cm) 1.414 Volume: (cm) 1.414 % Reduction in  Area: 74.3% % Reduction in Volume: 35.7% Epithelialization: None Tunneling: No Undermining: No Wound Description Classification: Category/Stage III Wound Margin: Thickened Exudate Amount: Medium Exudate Type: Serosanguineous Exudate Color: red, brown Foul Odor After Cleansing: No Slough/Fibrino No Wound Bed Granulation Amount: Large (67-100%) Exposed Structure Granulation Quality: Red Fascia Exposed: No Necrotic Amount: None Present (0%) Fat Layer (Subcutaneous Tissue) Exposed: Yes Tendon Exposed: No Muscle Exposed: No Joint Exposed: No Bone Exposed: No Treatment Notes Wound #8 (Sacrum) Wound Laterality:  Midline Cleanser [place]ram Ancillary Kit - 15 Day Supply Discharge Instruction: Use supplies as instructed; Kit contains: (15) Saline Bullets; (15) 3x3 Gauze; 15 pr Gloves Soap and Water Minner, [name] ([id number]) Discharge Instruction: Gently cleanse wound with antibacterial soap, rinse and pat dry prior to dressing wounds Peri-Wound Care Topical Primary Dressing Silvercel Small 2x2 (in/in) Discharge Instruction: Apply Silvercel Small 2x2 (in/in) as instructed Secondary Dressing (BORDER) Zetuvit Plus SILICONE BORDER Dressing 4x4 (in/in) Secured With Compression Wrap Compression Stockings Environmental education officer) Signed: 09/14/2022 6:29:15 PM By: Gretta Cool, BSN, RN, CWS, Kim RN, BSN Entered By: Gretta Cool, BSN, RN, CWS, Kim on 09/14/2022 11:37:58 [place]eland, [name] ([id number]) -------------------------------------------------------------------------------- Vitals Details Patient Name: [name]. Date of Service: 09/14/2022 10:45 AM Medical Record Number: [id number] Patient Account Number: [id number] Date of Birth/Sex: January 07, 1971 (51 y.o. M) Treating RN: Cornell Barman Primary Care Jose Alleyne: Einar Pheasant Other Clinician: Referring Deashia Soule: Einar Pheasant Treating Lance Galas/Extender: Skipper Cliche in Treatment: 22 Vital Signs Time Taken: 11:28 Temperature (F): 97.5 Height (in): 72 Pulse (bpm): 60 Weight (lbs): 175 Respiratory Rate (breaths/min): 16 Body Mass Index (BMI): 23.7 Blood Pressure (mmHg): 93/57 Reference Range: 80 - 120 mg / dl Electronic Signature(s) Signed: 09/14/2022 6:29:15 PM By: Gretta Cool, BSN, RN, CWS, Kim RN, BSN Entered By: Gretta Cool, BSN, RN, CWS, Kim on 09/14/2022 11:50:18

## 2022-09-14 NOTE — Progress Notes (Signed)
AGOSTINO, GORIN ([id number]) Visit Report for 09/14/2022 Chief Complaint Document Details Patient Name: NILS, THOR. Date of Service: 09/14/2022 10:45 AM Medical Record Number: [id number] Patient Account Number: [id number] Date of Birth/Sex: 14-Nov-1971 (51 y.o. M) Treating RN: Cornell Barman Primary Care Provider: Einar Pheasant Other Clinician: Referring Provider: Einar Pheasant Treating Provider/Extender: Skipper Cliche in Treatment: 87 Information Obtained from: Patient Chief Complaint Sacral pressure ulcer Electronic Signature(s) Signed: 09/14/2022 11:31:37 AM By: Worthy Keeler PA-C Entered By: Worthy Keeler on 09/14/2022 11:31:37 Thatch, Lahoma Rocker ([id number]) -------------------------------------------------------------------------------- Problem List Details Patient Name: [name]. Date of Service: 09/14/2022 10:45 AM Medical Record Number: [id number] Patient Account Number: [id number] Date of Birth/Sex: 10/12/1971 (51 y.o. 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

## 2022-09-22 DIAGNOSIS — E109 Type 1 diabetes mellitus without complications: Secondary | ICD-10-CM | POA: Diagnosis not present

## 2022-09-28 ENCOUNTER — Other Ambulatory Visit: Payer: Self-pay | Admitting: Internal Medicine

## 2022-09-28 LAB — HEMOGLOBIN A1C: Hemoglobin A1C: 5.6

## 2022-09-29 DIAGNOSIS — L89153 Pressure ulcer of sacral region, stage 3: Secondary | ICD-10-CM | POA: Diagnosis not present

## 2022-10-05 ENCOUNTER — Encounter (INDEPENDENT_AMBULATORY_CARE_PROVIDER_SITE_OTHER): Payer: Self-pay

## 2022-10-07 ENCOUNTER — Telehealth: Payer: Self-pay | Admitting: Pharmacist

## 2022-10-07 NOTE — Progress Notes (Signed)
This patient is appearing on the insurance-provided list for being at risk of failing the adherence measure for Statin Use in Persons with Diabetes (SUPD) medications this calendar year. He has not filled rosuvastatin since 2022.   Attempted to contact patient to discuss, left voicemail for patient to return my call at his convenience.   Catie Hedwig Morton, PharmD, [facility] [phone]

## 2022-10-08 ENCOUNTER — Ambulatory Visit: Payer: Medicare Other | Attending: Internal Medicine | Admitting: Internal Medicine

## 2022-10-08 ENCOUNTER — Encounter: Payer: Self-pay | Admitting: Internal Medicine

## 2022-10-08 VITALS — BP 160/90 | HR 52 | Ht 72.0 in | Wt 170.0 lb

## 2022-10-08 DIAGNOSIS — I639 Cerebral infarction, unspecified: Secondary | ICD-10-CM

## 2022-10-08 DIAGNOSIS — I5032 Chronic diastolic (congestive) heart failure: Secondary | ICD-10-CM | POA: Diagnosis not present

## 2022-10-08 DIAGNOSIS — Z959 Presence of cardiac and vascular implant and graft, unspecified: Secondary | ICD-10-CM | POA: Diagnosis not present

## 2022-10-08 NOTE — Patient Instructions (Signed)
Medication Instructions:  - Your physician recommends that you continue on your current medications as directed. Please refer to the Current Medication list given to you today.  *If you need a refill on your cardiac medications before your next appointment, please call your pharmacy*   Lab Work: - none ordered  If you have labs (blood work) drawn today and your tests are completely normal, you will receive your results only by: [place] (if you have MyChart) OR A paper copy in the mail If you have any lab test that is abnormal or we need to change your treatment, we will call you to review the results.   Testing/Procedures: - none ordered   Follow-Up: At [facility], you and your health needs are our priority.  As part of our continuing mission to provide you with exceptional heart care, we have created designated Provider Care Teams.  These Care Teams include your primary Cardiologist (physician) and Advanced Practice Providers (APPs -  Physician Assistants and Nurse Practitioners) who all work together to provide you with the care you need, when you need it.  We recommend signing up for the patient portal called "MyChart".  Sign up information is provided on this After Visit Summary.  MyChart is used to connect with patients for Virtual Visits (Telemedicine).  Patients are able to view lab/test results, encounter notes, upcoming appointments, etc.  Non-urgent messages can be sent to your provider as well.   To learn more about what you can do with MyChart, go to [email or web address].    Your next appointment:   1 year(s)  The format for your next appointment:   In Person  Provider:   Virl Axe, MD    Other Instructions N/a  Important Information About Sugar
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
## 2022-10-11 ENCOUNTER — Other Ambulatory Visit: Payer: Self-pay | Admitting: Internal Medicine

## 2022-10-12 ENCOUNTER — Other Ambulatory Visit: Payer: Self-pay

## 2022-10-12 ENCOUNTER — Other Ambulatory Visit: Payer: Self-pay | Admitting: Pharmacist

## 2022-10-12 ENCOUNTER — Other Ambulatory Visit: Payer: Self-pay | Admitting: Internal Medicine

## 2022-10-12 MED ORDER — ROSUVASTATIN CALCIUM 5 MG PO TABS: ORAL_TABLET | ORAL | 3 refills | Status: DC

## 2022-10-12 MED ORDER — BACLOFEN 20 MG PO TABS: ORAL_TABLET | ORAL | 0 refills | Status: DC

## 2022-10-12 NOTE — Telephone Encounter (Signed)
Rx sent to pharmacy  Thanks  Catie!!

## 2022-10-12 NOTE — Progress Notes (Signed)
Care Coordination Call  This patient is appearing on the insurance-provided list for being at risk of failing the adherence measure for Statin Use in Persons with Diabetes (SUPD) medications this calendar year.    Outreached patient, offered to send rosuvastatin refill. Patient agreeable. Refill sent on rosuvastatin. Patient also requested refill on baclofen, sent to [facility].   Catie Hedwig Morton, PharmD, [facility] [phone]

## 2022-10-19 ENCOUNTER — Encounter: Payer: Medicare Other | Attending: Physician Assistant | Admitting: Physician Assistant

## 2022-10-19 DIAGNOSIS — G8254 Quadriplegia, C5-C7 incomplete: Secondary | ICD-10-CM | POA: Insufficient documentation

## 2022-10-19 DIAGNOSIS — L98492 Non-pressure chronic ulcer of skin of other sites with fat layer exposed: Secondary | ICD-10-CM | POA: Diagnosis not present

## 2022-10-19 DIAGNOSIS — L89153 Pressure ulcer of sacral region, stage 3: Secondary | ICD-10-CM | POA: Diagnosis not present

## 2022-10-19 DIAGNOSIS — E109 Type 1 diabetes mellitus without complications: Secondary | ICD-10-CM | POA: Insufficient documentation

## 2022-10-19 DIAGNOSIS — I110 Hypertensive heart disease with heart failure: Secondary | ICD-10-CM | POA: Diagnosis not present

## 2022-10-19 DIAGNOSIS — I5042 Chronic combined systolic (congestive) and diastolic (congestive) heart failure: Secondary | ICD-10-CM | POA: Insufficient documentation
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
## 2022-10-23 NOTE — Progress Notes (Signed)
[name], [name] ([id number]) [id number] Page 1 of 8 Visit Report for 10/19/2022 Arrival Information Details Patient Name: Date of Service: [name]. 10/19/2022 2:45 PM Medical Record Number: [id number] Patient Account Number: [id number] Date of Birth/Sex: Treating RN: 1970/12/22 (51 y.o. [name]) Carlene Coria Primary Care Neveen Daponte: Einar Pheasant Other Clinician: Referring Lailana Shira: Treating Kent Braunschweig/Extender: Lavonia Dana Weeks in Treatment: 42 Visit Information History Since Last Visit All ordered tests and consults were completed: No Patient Arrived: Wheel Chair Added or deleted any medications: No Arrival Time: 15:08 Any new allergies or adverse reactions: No Accompanied By: self Had a fall or experienced change in No Transfer Assistance: None activities of daily living that may affect Patient Identification Verified: Yes risk of falls: Secondary Verification Process Completed: Yes Signs or symptoms of abuse/neglect since last visito No Patient Requires Transmission-Based Precautions: No Hospitalized since last visit: No Patient Has Alerts: No Implantable device outside of the clinic excluding No cellular tissue based products placed in the center since last visit: Has Dressing in Place as Prescribed: Yes Has Compression in Place as Prescribed: Yes Pain Present Now: No Electronic Signature(s) Signed: 10/23/2022 1:14:49 PM By: Carlene Coria RN Entered By: Carlene Coria on 10/19/2022 15:12:06 -------------------------------------------------------------------------------- Clinic Level of Care Assessment Details Patient Name: Date of Service: [name] 10/19/2022 2:45 PM Medical Record Number: [id number] Patient Account Number: [id number] Date of Birth/Sex: Treating RN: [birth year] (51 y.o. Oval Linsey Primary Care Shiven Junious: Einar Pheasant Other Clinician: Referring Kanetra Ho: Treating Lashika Erker/Extender: Lavonia Dana Weeks in Treatment: 46 Clinic Level of Care Assessment Items TOOL 4 Quantity Score X- 1 0 Use when only an EandM is performed on FOLLOW-UP visit ASSESSMENTS - Nursing Assessment / Reassessment X- 1 10 Reassessment of Co-morbidities (includes updates in patient status) KYRUS, HYDE ([id number]) [id number] Page 2 of 8 X- 1 5 Reassessment of Adherence to Treatment Plan ASSESSMENTS - Wound and Skin A ssessment / Reassessment X - Simple Wound Assessment / Reassessment - one wound 1 5 []  - 0 Complex Wound Assessment / Reassessment - multiple wounds []  - 0 Dermatologic / Skin Assessment (not related to wound area) ASSESSMENTS - Focused Assessment []  - 0 Circumferential Edema Measurements - multi extremities []  - 0 Nutritional Assessment / Counseling / Intervention []  - 0 Lower Extremity Assessment (monofilament, tuning fork, pulses) []  - 0 Peripheral Arterial Disease Assessment (using hand held doppler) ASSESSMENTS - Ostomy and/or Continence Assessment and Care []  - 0 Incontinence Assessment and Management []  - 0 Ostomy Care Assessment and Management (repouching, etc.) PROCESS - Coordination of Care X - Simple Patient / Family Education for ongoing care 1 15 []  - 0 Complex (extensive) Patient / Family Education for ongoing care []  - 0 Staff obtains Programmer, systems, Records, T Results / Process Orders est []  - 0 Staff telephones HHA, Nursing Homes / Clarify orders / etc []  - 0 Routine Transfer to another Facility (non-emergent condition) []  - 0 Routine Hospital Admission (non-emergent condition) []  - 0 New Admissions / Biomedical engineer / Ordering NPWT Apligraf, etc. , []  - 0 Emergency Hospital Admission (emergent condition) X- 1 10 Simple Discharge Coordination []  - 0 Complex (extensive) Discharge Coordination PROCESS - Special Needs []  - 0 Pediatric / Minor Patient Management []  - 0 Isolation Patient  Management []  - 0 Hearing / Language / Visual special needs []  - 0 Assessment of Community assistance (transportation, D/C planning, etc.) []  - 0 Additional assistance / Altered mentation []  - 0 Support Surface(s) Assessment (  bed, cushion, seat, etc.) INTERVENTIONS - Wound Cleansing / Measurement X - Simple Wound Cleansing - one wound 1 5 []  - 0 Complex Wound Cleansing - multiple wounds X- 1 5 Wound Imaging (photographs - any number of wounds) []  - 0 Wound Tracing (instead of photographs) X- 1 5 Simple Wound Measurement - one wound []  - 0 Complex Wound Measurement - multiple wounds INTERVENTIONS - Wound Dressings X - Small Wound Dressing one or multiple wounds 1 10 []  - 0 Medium Wound Dressing one or multiple wounds []  - 0 Large Wound Dressing one or multiple wounds []  - 0 Application of Medications - topical []  - 0 Application of Medications - injection INTERVENTIONS - Miscellaneous JORGE, RETZ ([id number]) [id number] Page 3 of 8 []  - 0 External ear exam []  - 0 Specimen Collection (cultures, biopsies, blood, body fluids, etc.) []  - 0 Specimen(s) / Culture(s) sent or taken to Lab for analysis []  - 0 Patient Transfer (multiple staff / Harrel Lemon Lift / Similar devices) []  - 0 Simple Staple / Suture removal (25 or less) []  - 0 Complex Staple / Suture removal (26 or more) []  - 0 Hypo / Hyperglycemic Management (close monitor of Blood Glucose) []  - 0 Ankle / Brachial Index (ABI) - do not check if billed separately X- 1 5 Vital Signs Has the patient been seen at the hospital within the last three years: Yes Total Score: 75 Level Of Care: New/Established - Level 2 Electronic Signature(s) Signed: 10/23/2022 1:14:49 PM By: Carlene Coria RN Entered By: Carlene Coria on 10/19/2022 15:29:34 -------------------------------------------------------------------------------- Encounter Discharge Information Details Patient Name: Date of  Service: [name] Baton EN K. 10/19/2022 2:45 PM Medical Record Number: [id number] Patient Account Number: [id number] Date of Birth/Sex: Treating RN: November 14, 1971 (51 y.o. Oval Linsey Primary Care Sapir Lavey: Einar Pheasant Other Clinician: Referring Leroy Pettway: Treating Bhavika Schnider/Extender: Lavonia Dana Weeks in Treatment: 46 Encounter Discharge Information Items Discharge Condition: Stable Ambulatory Status: Wheelchair Discharge Destination: Home Transportation: Private Auto Accompanied By: self Schedule Follow-up Appointment: Yes Clinical Summary of Care: Electronic Signature(s) Signed: 10/19/2022 3:33:54 PM By: Carlene Coria RN Entered By: Carlene Coria on 10/19/2022 15:33:53 Weems, Lahoma Rocker ([id number]) [id number] Page 4 of 8 -------------------------------------------------------------------------------- Lower Extremity Assessment Details Patient Name: Date of Service: AUNDREA, HIGGINBOTHAM 10/19/2022 2:45 PM Medical Record Number: [id number] Patient Account Number: [id number] Date of Birth/Sex: Treating RN: December 16, 1970 (51 y.o. [name]) Carlene Coria Primary Care Kyisha Fowle: Einar Pheasant Other Clinician: Referring Margretta Zamorano: Treating Edwardine Deschepper/Extender: Lavonia Dana Weeks in Treatment: 46 Electronic Signature(s) Signed: 10/23/2022 1:14:49 PM By: Carlene Coria RN Entered By: Carlene Coria on 10/19/2022 15:18:15 -------------------------------------------------------------------------------- Multi Wound Chart Details Patient Name: Date of Service: [name] Baton EN K. 10/19/2022 2:45 PM Medical Record Number: [id number] Patient Account Number: [id number] Date of Birth/Sex: Treating RN: February 24, 1971 (51 y.o. Oval Linsey Primary Care Deion Swift: Einar Pheasant Other Clinician: Referring Lenee Franze: Treating Timothea Bodenheimer/Extender: Lavonia Dana Weeks in Treatment: 83 Vital Signs Height(in): 72 Pulse(bpm): 70 Weight(lbs):  175 Blood Pressure(mmHg): 145/67 Body Mass Index(BMI): 23.7 Temperature(F): 97.7 Respiratory Rate(breaths/min): 16 [8:Photos:] [N/A:N/A] Midline Sacrum N/A N/A Wound Location: Gradually Appeared N/A N/A Wounding Event: Pressure Ulcer N/A N/A Primary Etiology: Congestive Heart Failure, Type I N/A N/A Comorbid History: Diabetes, History of pressure wounds, Paraplegia 05/07/2021 N/A N/A Date Acquired: 31 N/A N/A Weeks of Treatment: Open N/A N/A Wound Status: No N/A N/A Wound Recurrence: 1.5x1.2x1 N/A N/A Measurements L x W x D (cm) 1.414 N/A N/A A (cm) : rea 1.414 N/A N/A  Volume (cm) : 74.30% N/A N/A % Reduction in A rea: 35.70% N/A N/A % Reduction in Volume: Category/Stage III N/A N/A Classification: Medium N/A N/A Exudate A mount: Serosanguineous N/A N/A Exudate Type: red, brown N/A N/A Exudate Color: Thickened N/A N/A Wound Margin: Large (67-100%) N/A N/A Granulation A mount: Red N/A N/A Granulation QualityAMUN, STEMM ([id number]) [id number] Page 5 of 8 None Present (0%) N/A N/A Necrotic Amount: Fat Layer (Subcutaneous Tissue): Yes N/A N/A Exposed Structures: Fascia: No Tendon: No Muscle: No Joint: No Bone: No None N/A N/A Epithelialization: Treatment Notes Electronic Signature(s) Signed: 10/23/2022 1:14:49 PM By: Carlene Coria RN Entered By: Carlene Coria on 10/19/2022 15:18:54 -------------------------------------------------------------------------------- Multi-Disciplinary Care Plan Details Patient Name: Date of Service: [name] Baton EN K. 10/19/2022 2:45 PM Medical Record Number: [id number] Patient Account Number: [id number] Date of Birth/Sex: Treating RN: 1971-05-04 (51 y.o. Oval Linsey Primary Care Krista Godsil: Einar Pheasant Other Clinician: Referring Verena Shawgo: Treating Treacy Holcomb/Extender: Lavonia Dana Weeks in Treatment: 46 Active Inactive Wound/Skin Impairment Nursing  Diagnoses: Knowledge deficit related to ulceration/compromised skin integrity Goals: Patient/caregiver will verbalize understanding of skin care regimen Date Initiated: 11/27/2021 Target Resolution Date: 12/28/2021 Goal Status: Active Ulcer/skin breakdown will have a volume reduction of 30% by week 4 Date Initiated: 11/27/2021 Target Resolution Date: 01/28/2022 Goal Status: Active Ulcer/skin breakdown will have a volume reduction of 50% by week 8 Date Initiated: 11/27/2021 Target Resolution Date: 02/25/2022 Goal Status: Active Ulcer/skin breakdown will have a volume reduction of 80% by week 12 Date Initiated: 11/27/2021 Target Resolution Date: 03/28/2022 Goal Status: Active Ulcer/skin breakdown will heal within 14 weeks Date Initiated: 11/27/2021 Target Resolution Date: 04/27/2022 Goal Status: Active Interventions: Assess patient/caregiver ability to obtain necessary supplies Assess patient/caregiver ability to perform ulcer/skin care regimen upon admission and as needed Assess ulceration(s) every visit Notes: Electronic Signature(s) Signed: 10/23/2022 1:14:49 PM By: Carlene Coria RN Entered By: Carlene Coria on 10/19/2022 15:18:25 Loeza, Lahoma Rocker ([id number]) [id number] Page 6 of 8 -------------------------------------------------------------------------------- Pain Assessment Details Patient Name: Date of Service: YAHEL, FUSTON 10/19/2022 2:45 PM Medical Record Number: [id number] Patient Account Number: [id number] Date of Birth/Sex: Treating RN: 1971-02-11 (51 y.o. [name]) Carlene Coria Primary Care Aleynah Rocchio: Einar Pheasant Other Clinician: Referring Shakirra Buehler: Treating Jamison Yuhasz/Extender: Lavonia Dana Weeks in Treatment: 34 Active Problems Location of Pain Severity and Description of Pain Patient Has Paino No Site Locations Pain Management and Medication Current Pain Management: Electronic Signature(s) Signed: 10/23/2022 1:14:49 PM  By: Carlene Coria RN Entered By: Carlene Coria on 10/19/2022 15:12:40 -------------------------------------------------------------------------------- Patient/Caregiver Education Details Patient Name: Date of Service: Earley Brooke 11/13/2023andnbsp2:45 PM Medical Record Number: [id number] Patient Account Number: [id number] Date of Birth/Gender: Treating RN: [birth year] (51 y.o. Oval Linsey Primary Care Physician: Einar Pheasant Other Clinician: Referring Physician: Treating Physician/Extender: Lavonia Dana Weeks in Treatment: [address], [place] ([id number]) [id number] Page 7 of 8 Education Assessment Education Provided To: Patient Education Topics Provided Wound/Skin Impairment: Methods: Explain/Verbal Responses: State content correctly Electronic Signature(s) Signed: 10/23/2022 1:14:49 PM By: Carlene Coria RN Entered By: Carlene Coria on 10/19/2022 15:32:27 -------------------------------------------------------------------------------- Wound Assessment Details Patient Name: Date of Service: Earley Brooke. 10/19/2022 2:45 PM Medical Record Number: [id number] Patient Account Number: [id number] Date of Birth/Sex: Treating RN: February 02, 1971 (51 y.o. Oval Linsey Primary Care Verlin Duke: Einar Pheasant Other Clinician: Referring Ozro Russett: Treating Camay Pedigo/Extender: Lavonia Dana Weeks in Treatment: 46 Wound Status Wound Number: 8 Primary Pressure Ulcer Etiology: Wound Location: Midline Sacrum Wound Open Wounding Event:  Gradually Appeared Status: Date Acquired: 05/07/2021 Comorbid Congestive Heart Failure, Type I Diabetes, History of pressure Weeks Of Treatment: 46 History: wounds, Paraplegia Clustered Wound: No Photos Wound Measurements Length: (cm) 1.5 Width: (cm) 1.2 Depth: (cm) 1 Area: (cm) 1.414 Volume: (cm) 1.414 % Reduction in Area: 74.3% % Reduction in Volume: 35.7% Epithelialization:  None Tunneling: No Undermining: No Wound Description Classification: Category/Stage III Wound Margin: Thickened Exudate Amount: Medium IMARI, SIVERTSEN ([id number]) Exudate Type: Serosanguineous Exudate Color: red, brown Foul Odor After Cleansing: No Slough/Fibrino No [id number] Page 8 of 8 Wound Bed Granulation Amount: Large (67-100%) Exposed Structure Granulation Quality: Red Fascia Exposed: No Necrotic Amount: None Present (0%) Fat Layer (Subcutaneous Tissue) Exposed: Yes Tendon Exposed: No Muscle Exposed: No Joint Exposed: No Bone Exposed: No Treatment Notes Wound #8 (Sacrum) Wound Laterality: Midline Cleanser Peri-Wound Care Topical Primary Dressing Silvercel Small 2x2 (in/in) Discharge Instruction: Apply Silvercel Small 2x2 (in/in) as instructed Secondary Dressing (BORDER) Zetuvit Plus SILICONE BORDER Dressing 4x4 (in/in) Secured With Compression Wrap Compression Stockings Add-Ons Electronic Signature(s) Signed: 10/23/2022 1:14:49 PM By: Carlene Coria RN Entered By: Carlene Coria on 10/19/2022 15:17:24 -------------------------------------------------------------------------------- Vitals Details Patient Name: Date of Service: [name] Baton EN K. 10/19/2022 2:45 PM Medical Record Number: [id number] Patient Account Number: [id number] Date of Birth/Sex: Treating RN: 10-27-1971 (51 y.o. [name]) Carlene Coria Primary Care Shawan Corella: Einar Pheasant Other Clinician: Referring Raeanne Deschler: Treating Glennette Galster/Extender: Lavonia Dana Weeks in Treatment: 46 Vital Signs Time Taken: 15:12 Temperature (F): 97.7 Height (in): 72 Pulse (bpm): 70 Weight (lbs): 175 Respiratory Rate (breaths/min): 16 Body Mass Index (BMI): 23.7 Blood Pressure (mmHg): 145/67 Reference Range: 80 - 120 mg / dl Electronic Signature(s) Signed: 10/23/2022 1:14:49 PM By: Carlene Coria RN Entered By: Carlene Coria on 10/19/2022 15:12:26

## 2022-10-27 ENCOUNTER — Other Ambulatory Visit: Payer: Self-pay | Admitting: Internal Medicine

## 2022-11-02 DIAGNOSIS — L89153 Pressure ulcer of sacral region, stage 3: Secondary | ICD-10-CM | POA: Diagnosis not present

## 2022-11-11 ENCOUNTER — Other Ambulatory Visit: Payer: Self-pay | Admitting: Internal Medicine

## 2022-11-16 ENCOUNTER — Ambulatory Visit: Payer: Medicare Other | Admitting: Physician Assistant

## 2022-11-25 ENCOUNTER — Other Ambulatory Visit: Payer: Self-pay | Admitting: Internal Medicine

## 2022-11-26 DIAGNOSIS — K603 Anal fistula: Secondary | ICD-10-CM | POA: Diagnosis not present

## 2022-11-26 DIAGNOSIS — L89893 Pressure ulcer of other site, stage 3: Secondary | ICD-10-CM | POA: Diagnosis not present

## 2022-12-01 ENCOUNTER — Emergency Department: Payer: Medicare Other

## 2022-12-01 ENCOUNTER — Other Ambulatory Visit: Payer: Self-pay

## 2022-12-01 ENCOUNTER — Encounter: Payer: Self-pay | Admitting: *Deleted

## 2022-12-01 DIAGNOSIS — I10 Essential (primary) hypertension: Secondary | ICD-10-CM | POA: Diagnosis not present

## 2022-12-01 DIAGNOSIS — Z794 Long term (current) use of insulin: Secondary | ICD-10-CM | POA: Diagnosis not present

## 2022-12-01 DIAGNOSIS — E876 Hypokalemia: Secondary | ICD-10-CM | POA: Insufficient documentation

## 2022-12-01 DIAGNOSIS — J40 Bronchitis, not specified as acute or chronic: Secondary | ICD-10-CM | POA: Diagnosis not present

## 2022-12-01 DIAGNOSIS — Z1152 Encounter for screening for COVID-19: Secondary | ICD-10-CM | POA: Insufficient documentation

## 2022-12-01 DIAGNOSIS — E109 Type 1 diabetes mellitus without complications: Secondary | ICD-10-CM | POA: Insufficient documentation

## 2022-12-01 DIAGNOSIS — E871 Hypo-osmolality and hyponatremia: Secondary | ICD-10-CM | POA: Diagnosis not present

## 2022-12-01 DIAGNOSIS — R001 Bradycardia, unspecified: Secondary | ICD-10-CM | POA: Diagnosis not present

## 2022-12-01 DIAGNOSIS — R0602 Shortness of breath: Secondary | ICD-10-CM | POA: Diagnosis present

## 2022-12-01 DIAGNOSIS — I5032 Chronic diastolic (congestive) heart failure: Secondary | ICD-10-CM | POA: Insufficient documentation

## 2022-12-01 DIAGNOSIS — N390 Urinary tract infection, site not specified: Secondary | ICD-10-CM | POA: Diagnosis not present

## 2022-12-01 LAB — CBC
HCT: 30.9 % — ABNORMAL LOW (ref 39.0–52.0)
Hemoglobin: 10.7 g/dL — ABNORMAL LOW (ref 13.0–17.0)
MCH: 31.8 pg (ref 26.0–34.0)
MCHC: 34.6 g/dL (ref 30.0–36.0)
MCV: 91.7 fL (ref 80.0–100.0)
Platelets: 141 10*3/uL — ABNORMAL LOW (ref 150–400)
RBC: 3.37 MIL/uL — ABNORMAL LOW (ref 4.22–5.81)
RDW: 12.4 % (ref 11.5–15.5)
WBC: 6.2 10*3/uL (ref 4.0–10.5)
nRBC: 0 % (ref 0.0–0.2)

## 2022-12-01 LAB — BASIC METABOLIC PANEL
Anion gap: 9 (ref 5–15)
BUN: 9 mg/dL (ref 6–20)
CO2: 23 mmol/L (ref 22–32)
Calcium: 7.9 mg/dL — ABNORMAL LOW (ref 8.9–10.3)
Chloride: 90 mmol/L — ABNORMAL LOW (ref 98–111)
Creatinine, Ser: 0.7 mg/dL (ref 0.61–1.24)
GFR, Estimated: >60 mL/min (ref >60)
Glucose, Bld: 104 mg/dL — ABNORMAL HIGH (ref 70–99)
Potassium: 3.3 mmol/L — ABNORMAL LOW (ref 3.5–5.1)
Sodium: 122 mmol/L — ABNORMAL LOW (ref 135–145)

## 2022-12-01 LAB — URINALYSIS, ROUTINE W REFLEX MICROSCOPIC
Bilirubin Urine: NEGATIVE
Glucose, UA: NEGATIVE mg/dL
Hgb urine dipstick: NEGATIVE
Ketones, ur: NEGATIVE mg/dL
Nitrite: NEGATIVE
Protein, ur: NEGATIVE mg/dL
Specific Gravity, Urine: 1.006 (ref 1.005–1.030)
pH: 6 (ref 5.0–8.0)

## 2022-12-01 LAB — TROPONIN I (HIGH SENSITIVITY): Troponin I (High Sensitivity): 10 ng/L (ref <18)

## 2022-12-01 NOTE — ED Triage Notes (Addendum)
Pt reports sob and elevated bp today.  Pt took bp meds  pt took Viagra at 2030 to lower bp.    Sx for 2days.  No chest pain.  Pt alert  speech clear.  Pt is a Quadraplegic   pt has burning with urination.  Cath in place.

## 2022-12-02 ENCOUNTER — Emergency Department
Admission: EM | Admit: 2022-12-02 | Discharge: 2022-12-02 | Disposition: A | Discharge status: Home / Self Care | Payer: Medicare Other | Attending: Emergency Medicine | Admitting: Emergency Medicine

## 2022-12-02 ENCOUNTER — Telehealth: Payer: Self-pay | Admitting: Internal Medicine

## 2022-12-02 DIAGNOSIS — N390 Urinary tract infection, site not specified: Secondary | ICD-10-CM

## 2022-12-02 DIAGNOSIS — J40 Bronchitis, not specified as acute or chronic: Secondary | ICD-10-CM

## 2022-12-02 DIAGNOSIS — E871 Hypo-osmolality and hyponatremia: Secondary | ICD-10-CM

## 2022-12-02 LAB — RESP PANEL BY RT-PCR (RSV, FLU A&B, COVID)  RVPGX2
Influenza A by PCR: NEGATIVE
Influenza B by PCR: NEGATIVE
Resp Syncytial Virus by PCR: NEGATIVE
SARS Coronavirus 2 by RT PCR: NEGATIVE

## 2022-12-02 LAB — BRAIN NATRIURETIC PEPTIDE: B Natriuretic Peptide: 93.7 pg/mL (ref 0.0–100.0)

## 2022-12-02 LAB — TROPONIN I (HIGH SENSITIVITY): Troponin I (High Sensitivity): 10 ng/L (ref <18)

## 2022-12-02 MED ORDER — SODIUM CHLORIDE 0.9 % IV BOLUS
1000.0000 mL | Freq: Once | INTRAVENOUS | Status: AC
  Administered 2022-12-02: 1000 mL via INTRAVENOUS

## 2022-12-02 MED ORDER — CEPHALEXIN 500 MG PO CAPS: 500.0000 mg | ORAL_CAPSULE | Freq: Three times a day (TID) | ORAL | 0 refills | Status: DC

## 2022-12-02 MED ORDER — SODIUM CHLORIDE 0.9 % IV SOLN
1.0000 g | Freq: Once | INTRAVENOUS | Status: AC
  Administered 2022-12-02: 1 g via INTRAVENOUS
  Filled 2022-12-02: qty 10

## 2022-12-02 MED ORDER — AZITHROMYCIN 250 MG PO TABS: 250.0000 mg | ORAL_TABLET | Freq: Every day | ORAL | 0 refills | Status: DC

## 2022-12-02 MED ORDER — AZITHROMYCIN 500 MG PO TABS
500.0000 mg | ORAL_TABLET | Freq: Once | ORAL | Status: AC
  Administered 2022-12-02: 500 mg via ORAL
  Filled 2022-12-02: qty 1

## 2022-12-02 MED ORDER — CLONIDINE HCL 0.1 MG PO TABS: 0.1000 mg | ORAL_TABLET | Freq: Two times a day (BID) | ORAL | 0 refills | Status: DC | PRN

## 2022-12-02 MED ORDER — POTASSIUM CHLORIDE CRYS ER 20 MEQ PO TBCR
40.0000 meq | EXTENDED_RELEASE_TABLET | Freq: Once | ORAL | Status: AC
  Administered 2022-12-02: 40 meq via ORAL
  Filled 2022-12-02: qty 2

## 2022-12-02 NOTE — Discharge Instructions (Addendum)
Take antibiotics as prescribed: Keflex 553m 3 times daily x 7 days Azithromycin 2584mdaily x 4 days 2.  You may take Clonidine up to 2 tablets daily maximum as needed for : Blood pressure number greater than 180 Bottom blood pressure number greater than 100 3.  Return to the ER for worsening symptoms, persistent vomiting, difficulty breathing or other concerns.
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
## 2022-12-02 NOTE — Telephone Encounter (Signed)
Pt called stating he just got out of the hospital and need a hospital follow-up. I do not see anything available until Feb.

## 2022-12-03 ENCOUNTER — Ambulatory Visit: Payer: Medicare Other | Admitting: Internal Medicine

## 2022-12-03 ENCOUNTER — Encounter: Payer: Self-pay | Admitting: Internal Medicine

## 2022-12-03 DIAGNOSIS — L89153 Pressure ulcer of sacral region, stage 3: Secondary | ICD-10-CM | POA: Diagnosis not present

## 2022-12-03 LAB — URINE CULTURE

## 2022-12-03 NOTE — Telephone Encounter (Signed)
Sched for 1/26 at 11am Will hold for cancellation.

## 2022-12-04 ENCOUNTER — Telehealth: Payer: Self-pay

## 2022-12-04 DIAGNOSIS — Z87440 Personal history of urinary (tract) infections: Secondary | ICD-10-CM | POA: Diagnosis not present

## 2022-12-04 DIAGNOSIS — E109 Type 1 diabetes mellitus without complications: Secondary | ICD-10-CM | POA: Diagnosis not present

## 2022-12-04 DIAGNOSIS — G8250 Quadriplegia, unspecified: Secondary | ICD-10-CM | POA: Diagnosis not present

## 2022-12-04 DIAGNOSIS — R339 Retention of urine, unspecified: Secondary | ICD-10-CM | POA: Diagnosis not present

## 2022-12-04 NOTE — Telephone Encounter (Signed)
Patient states he was seen in the ED on 12/01/2022 for high blood pressure, weakness, shortness of breath, bronchitis.  Patient states ED stated they would fax in three different medications.  Patient states his blood pressure medication went through to his pharmacy [facility]), but the two antibiotics did not.  Patient states he has been in touch with the ED, but the person he spoke with transferred him to someone who has not called him back.  Patient states he would like to know if we can help him and he really needs his medication.  Patient states he would like for [name] to please call him.

## 2022-12-04 NOTE — Telephone Encounter (Signed)
Please let him know that I am not in the office.  Reviewed message and reviewed ER note.  He has had issues with low sodium for a long time.  Has previously seen nephrology - for w/up.  Previously has had issues with increased alcohol intake - which has contributed to his low sodium.  Needs to avoid alcohol intake.  Make sure eating regular meals and good control of his sugars.  Given persistent issue, needs a f/u appt with nephrology.  (Was seen at [facility]).  Also, given sodium was 122 in ER - needs f/u met b within the next several days.  Confirm doing ok.  Any acute issues, needs to be seen.

## 2022-12-04 NOTE — Telephone Encounter (Signed)
Per ED AVS, was supposed to have Azithromycin and Cephalexin sent in to pharm. Were documented but not sent. Called into [facility].   Pt advised

## 2022-12-04 NOTE — Telephone Encounter (Signed)
LM for pt to cb 

## 2022-12-05 DIAGNOSIS — E109 Type 1 diabetes mellitus without complications: Secondary | ICD-10-CM | POA: Diagnosis not present

## 2022-12-05 DIAGNOSIS — Z794 Long term (current) use of insulin: Secondary | ICD-10-CM | POA: Diagnosis not present

## 2022-12-09 ENCOUNTER — Encounter: Payer: Self-pay | Admitting: Internal Medicine

## 2022-12-09 ENCOUNTER — Telehealth: Payer: Self-pay | Admitting: Internal Medicine

## 2022-12-09 ENCOUNTER — Ambulatory Visit (INDEPENDENT_AMBULATORY_CARE_PROVIDER_SITE_OTHER): Payer: Medicare Other | Admitting: Internal Medicine

## 2022-12-09 VITALS — BP 138/80 | HR 73 | Temp 98.5°F | Resp 15 | Ht 70.0 in | Wt 170.0 lb

## 2022-12-09 DIAGNOSIS — E1069 Type 1 diabetes mellitus with other specified complication: Secondary | ICD-10-CM | POA: Diagnosis not present

## 2022-12-09 DIAGNOSIS — I959 Hypotension, unspecified: Secondary | ICD-10-CM | POA: Diagnosis not present

## 2022-12-09 DIAGNOSIS — G8220 Paraplegia, unspecified: Secondary | ICD-10-CM

## 2022-12-09 DIAGNOSIS — Z125 Encounter for screening for malignant neoplasm of prostate: Secondary | ICD-10-CM

## 2022-12-09 DIAGNOSIS — F1029 Alcohol dependence with unspecified alcohol-induced disorder: Secondary | ICD-10-CM

## 2022-12-09 DIAGNOSIS — L98491 Non-pressure chronic ulcer of skin of other sites limited to breakdown of skin: Secondary | ICD-10-CM

## 2022-12-09 DIAGNOSIS — I5032 Chronic diastolic (congestive) heart failure: Secondary | ICD-10-CM | POA: Diagnosis not present

## 2022-12-09 DIAGNOSIS — G903 Multi-system degeneration of the autonomic nervous system: Secondary | ICD-10-CM | POA: Diagnosis not present

## 2022-12-09 DIAGNOSIS — D62 Acute posthemorrhagic anemia: Secondary | ICD-10-CM

## 2022-12-09 DIAGNOSIS — F339 Major depressive disorder, recurrent, unspecified: Secondary | ICD-10-CM

## 2022-12-09 DIAGNOSIS — R059 Cough, unspecified: Secondary | ICD-10-CM

## 2022-12-09 DIAGNOSIS — I998 Other disorder of circulatory system: Secondary | ICD-10-CM | POA: Diagnosis not present

## 2022-12-09 DIAGNOSIS — R609 Edema, unspecified: Secondary | ICD-10-CM

## 2022-12-09 DIAGNOSIS — E871 Hypo-osmolality and hyponatremia: Secondary | ICD-10-CM | POA: Diagnosis not present

## 2022-12-09 DIAGNOSIS — N3000 Acute cystitis without hematuria: Secondary | ICD-10-CM

## 2022-12-09 MED ORDER — BACLOFEN 20 MG PO TABS: ORAL_TABLET | ORAL | 0 refills | Status: DC

## 2022-12-09 NOTE — Telephone Encounter (Signed)
Pt stated he had an appointment today and the ointment was not called in at the pharmacy
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
## 2022-12-10 ENCOUNTER — Telehealth: Payer: Self-pay

## 2022-12-10 LAB — BASIC METABOLIC PANEL
BUN: 15 mg/dL (ref 6–23)
CO2: 29 mEq/L (ref 19–32)
Calcium: 8.6 mg/dL (ref 8.4–10.5)
Chloride: 97 mEq/L (ref 96–112)
Creatinine, Ser: 0.74 mg/dL (ref 0.40–1.50)
GFR: 104.01 mL/min (ref >60.00)
Glucose, Bld: 97 mg/dL (ref 70–99)
Potassium: 4.8 mEq/L (ref 3.5–5.1)
Sodium: 131 mEq/L — ABNORMAL LOW (ref 135–145)

## 2022-12-10 LAB — IBC + FERRITIN
Ferritin: 46.4 ng/mL (ref 22.0–322.0)
Iron: 101 ug/dL (ref 42–165)
Saturation Ratios: 31 % (ref 20.0–50.0)
TIBC: 326.2 ug/dL (ref 250.0–450.0)
Transferrin: 233 mg/dL (ref 212.0–360.0)

## 2022-12-10 LAB — HEPATIC FUNCTION PANEL
ALT: 10 U/L (ref 0–53)
AST: 16 U/L (ref 0–37)
Albumin: 3.7 g/dL (ref 3.5–5.2)
Alkaline Phosphatase: 74 U/L (ref 39–117)
Bilirubin, Direct: 0.1 mg/dL (ref 0.0–0.3)
Total Bilirubin: 0.4 mg/dL (ref 0.2–1.2)
Total Protein: 6 g/dL (ref 6.0–8.3)

## 2022-12-10 LAB — CBC WITH DIFFERENTIAL/PLATELET
Basophils Absolute: 0 10*3/uL (ref 0.0–0.1)
Basophils Relative: 0.9 % (ref 0.0–3.0)
Eosinophils Absolute: 0 10*3/uL (ref 0.0–0.7)
Eosinophils Relative: 0.8 % (ref 0.0–5.0)
HCT: 33 % — ABNORMAL LOW (ref 39.0–52.0)
Hemoglobin: 11.4 g/dL — ABNORMAL LOW (ref 13.0–17.0)
Lymphocytes Relative: 14.9 % (ref 12.0–46.0)
Lymphs Abs: 0.8 10*3/uL (ref 0.7–4.0)
MCHC: 34.5 g/dL (ref 30.0–36.0)
MCV: 93.8 fl (ref 78.0–100.0)
Monocytes Absolute: 0.3 10*3/uL (ref 0.1–1.0)
Monocytes Relative: 6.2 % (ref 3.0–12.0)
Neutro Abs: 4.1 10*3/uL (ref 1.4–7.7)
Neutrophils Relative %: 77.2 % — ABNORMAL HIGH (ref 43.0–77.0)
Platelets: 288 10*3/uL (ref 150.0–400.0)
RBC: 3.52 Mil/uL — ABNORMAL LOW (ref 4.22–5.81)
RDW: 13.1 % (ref 11.5–15.5)
WBC: 5.3 10*3/uL (ref 4.0–10.5)

## 2022-12-10 LAB — LIPID PANEL
Cholesterol: 155 mg/dL (ref 0–200)
HDL: 52.5 mg/dL (ref >39.00)
LDL Cholesterol: 91 mg/dL (ref 0–99)
NonHDL: 102.82
Total CHOL/HDL Ratio: 3
Triglycerides: 59 mg/dL (ref 0.0–149.0)
VLDL: 11.8 mg/dL (ref 0.0–40.0)

## 2022-12-10 LAB — PSA: PSA: 0.38 ng/mL (ref 0.10–4.00)

## 2022-12-10 LAB — VITAMIN B12: Vitamin B-12: 837 pg/mL (ref 211–911)

## 2022-12-10 NOTE — Telephone Encounter (Signed)
Msg sent to pt to clarify what oitnemnt and for what

## 2022-12-10 NOTE — Telephone Encounter (Signed)
Mychart message sent.

## 2022-12-11 ENCOUNTER — Telehealth: Payer: Self-pay | Admitting: Internal Medicine

## 2022-12-11 MED ORDER — ACYCLOVIR 5 % EX OINT: 1.0000 | TOPICAL_OINTMENT | Freq: Three times a day (TID) | CUTANEOUS | 0 refills | Status: DC

## 2022-12-11 NOTE — Telephone Encounter (Signed)
Patient returned office phone call for lab results. 

## 2022-12-13 ENCOUNTER — Encounter: Payer: Self-pay | Admitting: Internal Medicine

## 2022-12-13 DIAGNOSIS — N390 Urinary tract infection, site not specified: Secondary | ICD-10-CM | POA: Insufficient documentation

## 2022-12-13 NOTE — Assessment & Plan Note (Signed)
Follow cbc.  

## 2022-12-13 NOTE — Assessment & Plan Note (Signed)
Lives by himself.  Independent in ADLs.   ?

## 2022-12-13 NOTE — Assessment & Plan Note (Signed)
Has been followed by cardiology.  Follow.

## 2022-12-13 NOTE — Assessment & Plan Note (Signed)
Discussed. Notify if desires any further intervention.  ?

## 2022-12-13 NOTE — Assessment & Plan Note (Signed)
Per cardiology.  Has lasix to take as needed.  Follow volume status.  Follow pressures.  Continue f/u with cardiology.

## 2022-12-13 NOTE — Assessment & Plan Note (Signed)
Treated with keflex.  Doing better. Has seen urology.

## 2022-12-13 NOTE — Assessment & Plan Note (Signed)
Has been followed / evaluated - wound clinic.  Just saw surgery.  Recommend f/u MRI to evaluate for question of fistula.  Would need to address this prior to referral to plastic surgeon for evaluation of wound.

## 2022-12-13 NOTE — Assessment & Plan Note (Signed)
Has seen [facility].  Swelling varies - he reports with hematuria.  Stable currently.  Follow.   

## 2022-12-13 NOTE — Assessment & Plan Note (Signed)
He has cut down alcohol intake.  Discussed.

## 2022-12-13 NOTE — Assessment & Plan Note (Signed)
Has had issues with low sodium.  Has had extensive w/up.  Recent decrease in setting of being sick.  Has cut down alcohol intake.  Breathing better.  Feeling better. Recheck sodium today.  Declines nephrology referral.

## 2022-12-13 NOTE — Assessment & Plan Note (Signed)
Has seen Dr Gabriel Carina.  Discussed diet.  No significant problems with low sugars.  Follow met b and a1c. Overall sugars stable.

## 2022-12-13 NOTE — Assessment & Plan Note (Signed)
Previous cough.  Improved with recent zpak.  Follow.

## 2022-12-13 NOTE — Assessment & Plan Note (Signed)
Has been evaluated by cardiology.  W/up as outlined previously.  Follow.  Currently stable.

## 2022-12-24 ENCOUNTER — Encounter: Payer: Self-pay | Admitting: Internal Medicine

## 2022-12-25 NOTE — Telephone Encounter (Signed)
See result note

## 2022-12-28 ENCOUNTER — Other Ambulatory Visit: Payer: Self-pay | Admitting: Nurse Practitioner

## 2022-12-28 DIAGNOSIS — K603 Anal fistula: Secondary | ICD-10-CM

## 2022-12-31 ENCOUNTER — Encounter: Payer: Medicare Other | Attending: Physician Assistant | Admitting: Internal Medicine

## 2022-12-31 DIAGNOSIS — I5042 Chronic combined systolic (congestive) and diastolic (congestive) heart failure: Secondary | ICD-10-CM | POA: Insufficient documentation

## 2022-12-31 DIAGNOSIS — G8220 Paraplegia, unspecified: Secondary | ICD-10-CM | POA: Diagnosis not present

## 2022-12-31 DIAGNOSIS — E109 Type 1 diabetes mellitus without complications: Secondary | ICD-10-CM | POA: Diagnosis not present

## 2022-12-31 DIAGNOSIS — I110 Hypertensive heart disease with heart failure: Secondary | ICD-10-CM | POA: Insufficient documentation

## 2022-12-31 DIAGNOSIS — L89153 Pressure ulcer of sacral region, stage 3: Secondary | ICD-10-CM | POA: Insufficient documentation
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
## 2022-12-31 NOTE — Progress Notes (Signed)
RAYMIR, FROMMELT ([id number]) [id number] Page 1 of 9 Visit Report for 12/31/2022 Arrival Information Details Patient Name: Date of Service: [name], [name]. 12/31/2022 12:45 PM Medical Record Number: [id number] Patient Account Number: [id number] Date of Birth/Sex: Treating RN: 11-Jan-1971 (51 y.o. [name]) Carlene Coria Primary Care Torryn Hudspeth: Einar Pheasant Other Clinician: Referring Braylee Lal: Treating Amaryah Mallen/Extender: RO BSO N, [place], Charlene Weeks in Treatment: 9 Visit Information History Since Last Visit Added or deleted any medications: No Patient Arrived: Wheel Chair Any new allergies or adverse reactions: No Arrival Time: 12:53 Had a fall or experienced change in No Accompanied By: self activities of daily living that may affect Transfer Assistance: None risk of falls: Patient Identification Verified: Yes Signs or symptoms of abuse/neglect since last visito No Secondary Verification Process Completed: Yes Hospitalized since last visit: No Patient Requires Transmission-Based Precautions: No Implantable device outside of the clinic excluding No Patient Has Alerts: No cellular tissue based products placed in the center since last visit: Has Dressing in Place as Prescribed: Yes Pain Present Now: No Electronic Signature(s) Signed: 12/31/2022 4:03:24 PM By: Carlene Coria RN Entered By: Carlene Coria on 12/31/2022 12:53:38 -------------------------------------------------------------------------------- Clinic Level of Care Assessment Details Patient Name: Date of Service: [name], [name] 12/31/2022 12:45 PM Medical Record Number: [id number] Patient Account Number: [id number] Date of Birth/Sex: Treating RN: [birth year] (51 y.o. [name]) Carlene Coria Primary Care Jase Reep: Einar Pheasant Other Clinician: Referring Tytianna Greenley: Treating Marquelle Balow/Extender: RO BSO N, MICHA EL Landry Dyke, Charlene Weeks in Treatment: 20 Clinic Level of Care Assessment  Items TOOL 4 Quantity Score X- 1 0 Use when only an EandM is performed on FOLLOW-UP visit ASSESSMENTS - Nursing Assessment / Reassessment X- 1 10 Reassessment of Co-morbidities (includes updates in patient status) X- 1 5 Reassessment of Adherence to Treatment Plan ALEXY, HELDT ([id number]) [id number] Page 2 of 9 ASSESSMENTS - Wound and Skin A ssessment / Reassessment X - Simple Wound Assessment / Reassessment - one wound 1 5 '[]'$  - 0 Complex Wound Assessment / Reassessment - multiple wounds '[]'$  - 0 Dermatologic / Skin Assessment (not related to wound area) ASSESSMENTS - Focused Assessment '[]'$  - 0 Circumferential Edema Measurements - multi extremities '[]'$  - 0 Nutritional Assessment / Counseling / Intervention '[]'$  - 0 Lower Extremity Assessment (monofilament, tuning fork, pulses) '[]'$  - 0 Peripheral Arterial Disease Assessment (using hand held doppler) ASSESSMENTS - Ostomy and/or Continence Assessment and Care '[]'$  - 0 Incontinence Assessment and Management '[]'$  - 0 Ostomy Care Assessment and Management (repouching, etc.) PROCESS - Coordination of Care X - Simple Patient / Family Education for ongoing care 1 15 '[]'$  - 0 Complex (extensive) Patient / Family Education for ongoing care '[]'$  - 0 Staff obtains Programmer, systems, Records, T Results / Process Orders est '[]'$  - 0 Staff telephones HHA, Nursing Homes / Clarify orders / etc '[]'$  - 0 Routine Transfer to another Facility (non-emergent condition) '[]'$  - 0 Routine Hospital Admission (non-emergent condition) '[]'$  - 0 New Admissions / Biomedical engineer / Ordering NPWT Apligraf, etc. , '[]'$  - 0 Emergency Hospital Admission (emergent condition) X- 1 10 Simple Discharge Coordination '[]'$  - 0 Complex (extensive) Discharge Coordination PROCESS - Special Needs '[]'$  - 0 Pediatric / Minor Patient Management '[]'$  - 0 Isolation Patient Management '[]'$  - 0 Hearing / Language / Visual special needs '[]'$  - 0 Assessment of  Community assistance (transportation, D/C planning, etc.) '[]'$  - 0 Additional assistance / Altered mentation '[]'$  - 0 Support Surface(s) Assessment (bed, cushion, seat, etc.) INTERVENTIONS - Wound  Cleansing / Measurement X - Simple Wound Cleansing - one wound 1 5 '[]'$  - 0 Complex Wound Cleansing - multiple wounds X- 1 5 Wound Imaging (photographs - any number of wounds) '[]'$  - 0 Wound Tracing (instead of photographs) X- 1 5 Simple Wound Measurement - one wound '[]'$  - 0 Complex Wound Measurement - multiple wounds INTERVENTIONS - Wound Dressings X - Small Wound Dressing one or multiple wounds 1 10 '[]'$  - 0 Medium Wound Dressing one or multiple wounds '[]'$  - 0 Large Wound Dressing one or multiple wounds '[]'$  - 0 Application of Medications - topical '[]'$  - 0 Application of Medications - injection INTERVENTIONS - Miscellaneous '[]'$  - 0 External ear exam OSINACHI, NAVARRETTE ([id number]) [id number] Page 3 of 9 '[]'$  - 0 Specimen Collection (cultures, biopsies, blood, body fluids, etc.) '[]'$  - 0 Specimen(s) / Culture(s) sent or taken to Lab for analysis '[]'$  - 0 Patient Transfer (multiple staff / Harrel Lemon Lift / Similar devices) '[]'$  - 0 Simple Staple / Suture removal (25 or less) '[]'$  - 0 Complex Staple / Suture removal (26 or more) '[]'$  - 0 Hypo / Hyperglycemic Management (close monitor of Blood Glucose) '[]'$  - 0 Ankle / Brachial Index (ABI) - do not check if billed separately X- 1 5 Vital Signs Has the patient been seen at the hospital within the last three years: Yes Total Score: 75 Level Of Care: New/Established - Level 2 Electronic Signature(s) Signed: 12/31/2022 4:03:24 PM By: Carlene Coria RN Entered By: Carlene Coria on 12/31/2022 13:21:54 -------------------------------------------------------------------------------- Encounter Discharge Information Details Patient Name: Date of Service: [name] Baton EN [name]. 12/31/2022 12:45 PM Medical Record Number: [id number] Patient Account  Number: [id number] Date of Birth/Sex: Treating RN: Sep 19, 1971 (51 y.o. Oval Linsey Primary Care Eamonn Sermeno: Einar Pheasant Other Clinician: Referring Tarique Loveall: Treating Berry Godsey/Extender: RO BSO N, [place], Charlene Weeks in Treatment: 11 Encounter Discharge Information Items Discharge Condition: Stable Ambulatory Status: Wheelchair Discharge Destination: Home Transportation: Private Auto Accompanied By: self Schedule Follow-up Appointment: Yes Clinical Summary of Care: Electronic Signature(s) Signed: 12/31/2022 1:23:01 PM By: Carlene Coria RN Entered By: Carlene Coria on 12/31/2022 13:23:01 -------------------------------------------------------------------------------- Lower Extremity Assessment Details Patient Name: Date of Service: [name], [name] EN [name]. 12/31/2022 12:45 PM Costantino, [name] ([id number]) [id number] Page 4 of 9 Medical Record Number: [id number] Patient Account Number: [id number] Date of Birth/Sex: Treating RN: 1970/12/17 (51 y.o. [name]) Carlene Coria Primary Care Robi Mitter: Einar Pheasant Other Clinician: Referring Tomekia Helton: Treating Idelia Caudell/Extender: RO BSO N, [place], Charlene Weeks in Treatment: 72 Electronic Signature(s) Signed: 12/31/2022 4:03:24 PM By: Carlene Coria RN Entered By: Carlene Coria on 12/31/2022 13:01:26 -------------------------------------------------------------------------------- Multi Wound Chart Details Patient Name: Date of Service: [name]. 12/31/2022 12:45 PM Medical Record Number: [id number] Patient Account Number: [id number] Date of Birth/Sex: Treating RN: 27-Nov-1971 (51 y.o. [name]) Carlene Coria Primary Care Shiela Bruns: Einar Pheasant Other Clinician: Referring Chae Oommen: Treating Mickayla Trouten/Extender: RO BSO N, MICHA EL Landry Dyke, Charlene Weeks in Treatment: 63 Vital Signs Height(in): 72 Pulse(bpm): 51 Weight(lbs): 175 Blood Pressure(mmHg): 204/120 Body Mass Index(BMI):  23.7 Temperature(F): 98 Respiratory Rate(breaths/min): 18 [8:Photos:] [N/A:N/A] Midline Sacrum N/A N/A Wound Location: Gradually Appeared N/A N/A Wounding Event: Pressure Ulcer N/A N/A Primary Etiology: Congestive Heart Failure, Type I N/A N/A Comorbid History: Diabetes, History of pressure wounds, Paraplegia 05/07/2021 N/A N/A Date Acquired: 52 N/A N/A Weeks of Treatment: Open N/A N/A Wound Status: No N/A N/A Wound Recurrence: 1.4x2x0.5 N/A N/A Measurements L x W x D (cm) 2.199 N/A N/A A (cm) :  rea 1.1 N/A N/A Volume (cm) : 60.00% N/A N/A % Reduction in A rea: 50.00% N/A N/A % Reduction in Volume: Category/Stage III N/A N/A Classification: Medium N/A N/A Exudate A mount: Serosanguineous N/A N/A Exudate Type: red, brown N/A N/A Exudate Color: Thickened N/A N/A Wound Margin: Large (67-100%) N/A N/A Granulation A mount: Red N/A N/A Granulation Quality: None Present (0%) N/A N/A Necrotic A mount: Fat Layer (Subcutaneous Tissue): Yes N/A N/A Exposed Structures: Fascia: No Tendon: No [name], [name] ([id number]) [id number] Page 5 of 9 Muscle: No Joint: No Bone: No None N/A N/A Epithelialization: Treatment Notes Electronic Signature(s) Signed: 12/31/2022 4:03:24 PM By: Carlene Coria RN Entered By: Carlene Coria on 12/31/2022 13:01:37 -------------------------------------------------------------------------------- Multi-Disciplinary Care Plan Details Patient Name: Date of Service: [name] Baton EN [name]. 12/31/2022 12:45 PM Medical Record Number: [id number] Patient Account Number: [id number] Date of Birth/Sex: Treating RN: 12-23-1970 (51 y.o. [name]) Carlene Coria Primary Care Carry Weesner: Einar Pheasant Other Clinician: Referring Aleyah Balik: Treating Emerly Prak/Extender: RO BSO N, MICHA EL Landry Dyke, Charlene Weeks in Treatment: 67 Active Inactive Wound/Skin Impairment Nursing Diagnoses: Knowledge deficit related to ulceration/compromised  skin integrity Goals: Patient/caregiver will verbalize understanding of skin care regimen Date Initiated: 11/27/2021 Target Resolution Date: 12/28/2021 Goal Status: Active Ulcer/skin breakdown will have a volume reduction of 30% by week 4 Date Initiated: 11/27/2021 Target Resolution Date: 01/28/2022 Goal Status: Active Ulcer/skin breakdown will have a volume reduction of 50% by week 8 Date Initiated: 11/27/2021 Target Resolution Date: 02/25/2022 Goal Status: Active Ulcer/skin breakdown will have a volume reduction of 80% by week 12 Date Initiated: 11/27/2021 Target Resolution Date: 03/28/2022 Goal Status: Active Ulcer/skin breakdown will heal within 14 weeks Date Initiated: 11/27/2021 Target Resolution Date: 04/27/2022 Goal Status: Active Interventions: Assess patient/caregiver ability to obtain necessary supplies Assess patient/caregiver ability to perform ulcer/skin care regimen upon admission and as needed Assess ulceration(s) every visit Notes: Electronic Signature(s) Signed: 12/31/2022 4:03:24 PM By: Carlene Coria RN Entered By: Carlene Coria on 12/31/2022 13:01:57 [name], [name] ([id number]) [id number] Page 6 of 9 -------------------------------------------------------------------------------- Pain Assessment Details Patient Name: Date of Service: [name], [name] 12/31/2022 12:45 PM Medical Record Number: [id number] Patient Account Number: [id number] Date of Birth/Sex: Treating RN: Apr 12, 1971 (51 y.o. [name]) Carlene Coria Primary Care Elye Harmsen: Einar Pheasant Other Clinician: Referring Kalen Neidert: Treating Duard Spiewak/Extender: RO BSO N, [place], Charlene Weeks in Treatment: 54 Active Problems Location of Pain Severity and Description of Pain Patient Has Paino No Site Locations Pain Management and Medication Current Pain Management: Electronic Signature(s) Signed: 12/31/2022 4:03:24 PM By: Carlene Coria RN Entered By: Carlene Coria on  12/31/2022 12:54:08 -------------------------------------------------------------------------------- Patient/Caregiver Education Details Patient Name: Date of Service: [name] 1/25/2024andnbsp12:45 PM Medical Record Number: [id number] Patient Account Number: [id number] Date of Birth/Gender: Treating RN: 11/05/1971 (51 y.o. [name]) Carlene Coria Primary Care Physician: Einar Pheasant Other Clinician: Referring Physician: Treating Physician/Extender: [name], [place], Charlene Weeks in Treatment: [address] [place] ([id number]) [id number] Page 7 of 9 Education Assessment Education Provided To: Patient Education Topics Provided Wound/Skin Impairment: Methods: Explain/Verbal Responses: State content correctly Electronic Signature(s) Signed: 12/31/2022 4:03:24 PM By: Carlene Coria RN Entered By: Carlene Coria on 12/31/2022 13:01:53 -------------------------------------------------------------------------------- Wound Assessment Details Patient Name: Date of Service: [name], [name] 12/31/2022 12:45 PM Medical Record Number: [id number] Patient Account Number: [id number] Date of Birth/Sex: Treating RN: 06/09/1971 (51 y.o. [name]) Carlene Coria Primary Care Aloura Matsuoka: Einar Pheasant Other Clinician: Referring Zaya Kessenich: Treating Angad Nabers/Extender: RO BSO N, [place] Scott, Charlene  Weeks in Treatment: 57 Wound Status Wound Number: 8 Primary Pressure Ulcer Etiology: Wound Location: Midline Sacrum Wound Open Wounding Event: Gradually Appeared Status: Date Acquired: 05/07/2021 Comorbid Congestive Heart Failure, Type I Diabetes, History of pressure Weeks Of Treatment: 57 History: wounds, Paraplegia Clustered Wound: No Photos Wound Measurements Length: (cm) 1.4 Width: (cm) 2 Depth: (cm) 0.5 Area: (cm) 2.199 Volume: (cm) 1.1 % Reduction in Area: 60% % Reduction in Volume: 50% Epithelialization: None Tunneling: No Undermining: No Wound  Description Classification: Category/Stage III Wound Margin: Thickened Exudate Amount: Medium Exudate Type: Serosanguineous [name], [name] ([id number]) Exudate Color: red, brown Foul Odor After Cleansing: No Slough/Fibrino No [id number] Page 8 of 9 Wound Bed Granulation Amount: Large (67-100%) Exposed Structure Granulation Quality: Red Fascia Exposed: No Necrotic Amount: None Present (0%) Fat Layer (Subcutaneous Tissue) Exposed: Yes Tendon Exposed: No Muscle Exposed: No Joint Exposed: No Bone Exposed: No Treatment Notes Wound #8 (Sacrum) Wound Laterality: Midline Cleanser [place]ram Ancillary Kit - 15 Day Supply Discharge Instruction: Use supplies as instructed; Kit contains: (15) Saline Bullets; (15) 3x3 Gauze; 15 pr Gloves Soap and Water Discharge Instruction: Gently cleanse wound with antibacterial soap, rinse and pat dry prior to dressing wounds Peri-Wound Care Topical Primary Dressing Silvercel Small 2x2 (in/in) Discharge Instruction: Apply Silvercel Small 2x2 (in/in) as instructed Secondary Dressing (BORDER) Zetuvit Plus SILICONE BORDER Dressing 4x4 (in/in) Secured With Compression Wrap Compression Stockings Add-Ons Electronic Signature(s) Signed: 12/31/2022 4:03:24 PM By: Carlene Coria RN Entered By: Carlene Coria on 12/31/2022 13:01:03 -------------------------------------------------------------------------------- Vitals Details Patient Name: Date of Service: [name] Baton EN [name]. 12/31/2022 12:45 PM Medical Record Number: [id number] Patient Account Number: [id number] Date of Birth/Sex: Treating RN: [birth year] (51 y.o. [name]) Carlene Coria Primary Care Halley Shepheard: Einar Pheasant Other Clinician: Referring Neale Marzette: Treating Aleynah Rocchio/Extender: RO BSO N, MICHA EL Landry Dyke, Charlene Weeks in Treatment: 57 Vital Signs Time Taken: 12:50 Temperature (F): 98 Height (in): 72 Pulse (bpm): 51 Weight (lbs): 175 Respiratory Rate (breaths/min): 18 Body  Mass Index (BMI): 23.7 Blood Pressure (mmHg): 204/120 Reference Range: 80 - 120 mg / dl Electronic Signature(s) [name], [name] ([id number]) [id number] Page 9 of 9 Signed: 12/31/2022 4:03:24 PM By: Carlene Coria RN Entered By: Carlene Coria on 12/31/2022 12:53:59

## 2023-01-01 ENCOUNTER — Inpatient Hospital Stay: Payer: Medicare Other | Admitting: Internal Medicine

## 2023-01-01 DIAGNOSIS — L89153 Pressure ulcer of sacral region, stage 3: Secondary | ICD-10-CM | POA: Diagnosis not present

## 2023-01-04 ENCOUNTER — Encounter: Payer: Self-pay | Admitting: Internal Medicine

## 2023-01-04 NOTE — Telephone Encounter (Signed)
Ok to refill until appt and ok for referral to urology.

## 2023-01-05 DIAGNOSIS — G8250 Quadriplegia, unspecified: Secondary | ICD-10-CM | POA: Diagnosis not present

## 2023-01-05 DIAGNOSIS — R339 Retention of urine, unspecified: Secondary | ICD-10-CM | POA: Diagnosis not present

## 2023-01-05 DIAGNOSIS — E109 Type 1 diabetes mellitus without complications: Secondary | ICD-10-CM | POA: Diagnosis not present

## 2023-01-05 DIAGNOSIS — Z87440 Personal history of urinary (tract) infections: Secondary | ICD-10-CM | POA: Diagnosis not present

## 2023-01-06 ENCOUNTER — Other Ambulatory Visit: Payer: Self-pay

## 2023-01-06 DIAGNOSIS — N3000 Acute cystitis without hematuria: Secondary | ICD-10-CM

## 2023-01-06 MED ORDER — METHENAMINE MANDELATE 1 G PO TABS: 1000.0000 mg | ORAL_TABLET | Freq: Two times a day (BID) | ORAL | 1 refills | Status: DC

## 2023-01-06 MED ORDER — ROSUVASTATIN CALCIUM 5 MG PO TABS: ORAL_TABLET | ORAL | 1 refills | Status: DC

## 2023-01-06 NOTE — Telephone Encounter (Signed)
Methanamine sent in to pharm Referral placed  Pt also given lab results from 1/3 Confirmed pt is taking crestor once per week. Agreeable to increasing crestor to 3 days per week. Med List updated, new rx sent in for pt.

## 2023-01-07 ENCOUNTER — Emergency Department: Payer: Medicare Other

## 2023-01-07 ENCOUNTER — Ambulatory Visit: Payer: Medicare Other

## 2023-01-07 ENCOUNTER — Encounter: Payer: Self-pay | Admitting: Internal Medicine

## 2023-01-07 ENCOUNTER — Other Ambulatory Visit: Payer: Self-pay

## 2023-01-07 ENCOUNTER — Emergency Department
Admission: RE | Admit: 2023-01-07 | Discharge: 2023-01-07 | Disposition: A | Discharge status: Home / Self Care | Payer: Medicare Other | Source: Ambulatory Visit | Attending: Nurse Practitioner | Admitting: Nurse Practitioner

## 2023-01-07 ENCOUNTER — Emergency Department
Admission: EM | Admit: 2023-01-07 | Discharge: 2023-01-08 | Disposition: A | Discharge status: Home / Self Care | Payer: Medicare Other | Attending: Student in an Organized Health Care Education/Training Program | Admitting: Student in an Organized Health Care Education/Training Program

## 2023-01-07 DIAGNOSIS — F1020 Alcohol dependence, uncomplicated: Secondary | ICD-10-CM | POA: Diagnosis not present

## 2023-01-07 DIAGNOSIS — R001 Bradycardia, unspecified: Secondary | ICD-10-CM | POA: Diagnosis not present

## 2023-01-07 DIAGNOSIS — E109 Type 1 diabetes mellitus without complications: Secondary | ICD-10-CM | POA: Insufficient documentation

## 2023-01-07 DIAGNOSIS — R200 Anesthesia of skin: Secondary | ICD-10-CM | POA: Insufficient documentation

## 2023-01-07 DIAGNOSIS — K603 Anal fistula: Secondary | ICD-10-CM | POA: Insufficient documentation

## 2023-01-07 DIAGNOSIS — R202 Paresthesia of skin: Secondary | ICD-10-CM | POA: Diagnosis not present

## 2023-01-07 DIAGNOSIS — G8220 Paraplegia, unspecified: Secondary | ICD-10-CM | POA: Diagnosis not present

## 2023-01-07 DIAGNOSIS — L89159 Pressure ulcer of sacral region, unspecified stage: Secondary | ICD-10-CM | POA: Diagnosis not present

## 2023-01-07 DIAGNOSIS — R29818 Other symptoms and signs involving the nervous system: Secondary | ICD-10-CM | POA: Diagnosis not present

## 2023-01-07 DIAGNOSIS — G8250 Quadriplegia, unspecified: Secondary | ICD-10-CM | POA: Diagnosis not present

## 2023-01-07 DIAGNOSIS — Z8739 Personal history of other diseases of the musculoskeletal system and connective tissue: Secondary | ICD-10-CM | POA: Diagnosis not present

## 2023-01-07 DIAGNOSIS — I509 Heart failure, unspecified: Secondary | ICD-10-CM | POA: Diagnosis not present

## 2023-01-07 DIAGNOSIS — R339 Retention of urine, unspecified: Secondary | ICD-10-CM | POA: Diagnosis not present

## 2023-01-07 LAB — COMPREHENSIVE METABOLIC PANEL
ALT: 15 U/L (ref 0–44)
AST: 24 U/L (ref 15–41)
Albumin: 3.9 g/dL (ref 3.5–5.0)
Alkaline Phosphatase: 82 U/L (ref 38–126)
Anion gap: 8 (ref 5–15)
BUN: 16 mg/dL (ref 6–20)
CO2: 26 mmol/L (ref 22–32)
Calcium: 8.8 mg/dL — ABNORMAL LOW (ref 8.9–10.3)
Chloride: 97 mmol/L — ABNORMAL LOW (ref 98–111)
Creatinine, Ser: 0.81 mg/dL (ref 0.61–1.24)
GFR, Estimated: >60 mL/min (ref >60)
Glucose, Bld: 156 mg/dL — ABNORMAL HIGH (ref 70–99)
Potassium: 4.9 mmol/L (ref 3.5–5.1)
Sodium: 131 mmol/L — ABNORMAL LOW (ref 135–145)
Total Bilirubin: 0.8 mg/dL (ref 0.3–1.2)
Total Protein: 6.7 g/dL (ref 6.5–8.1)

## 2023-01-07 LAB — DIFFERENTIAL
Abs Immature Granulocytes: 0.01 10*3/uL (ref 0.00–0.07)
Basophils Absolute: 0 10*3/uL (ref 0.0–0.1)
Basophils Relative: 0 %
Eosinophils Absolute: 0.1 10*3/uL (ref 0.0–0.5)
Eosinophils Relative: 1 %
Immature Granulocytes: 0 %
Lymphocytes Relative: 11 %
Lymphs Abs: 0.8 10*3/uL (ref 0.7–4.0)
Monocytes Absolute: 0.4 10*3/uL (ref 0.1–1.0)
Monocytes Relative: 5 %
Neutro Abs: 5.7 10*3/uL (ref 1.7–7.7)
Neutrophils Relative %: 83 %

## 2023-01-07 LAB — CBC
HCT: 34.3 % — ABNORMAL LOW (ref 39.0–52.0)
Hemoglobin: 11.7 g/dL — ABNORMAL LOW (ref 13.0–17.0)
MCH: 31.9 pg (ref 26.0–34.0)
MCHC: 34.1 g/dL (ref 30.0–36.0)
MCV: 93.5 fL (ref 80.0–100.0)
Platelets: 193 10*3/uL (ref 150–400)
RBC: 3.67 MIL/uL — ABNORMAL LOW (ref 4.22–5.81)
RDW: 13.3 % (ref 11.5–15.5)
WBC: 7 10*3/uL (ref 4.0–10.5)
nRBC: 0 % (ref 0.0–0.2)

## 2023-01-07 LAB — APTT: aPTT: 38 seconds — ABNORMAL HIGH (ref 24–36)

## 2023-01-07 LAB — PROTIME-INR
INR: 1.1 (ref 0.8–1.2)
Prothrombin Time: 14.3 seconds (ref 11.4–15.2)

## 2023-01-07 LAB — ETHANOL: Alcohol, Ethyl (B): <10 mg/dL (ref <10)

## 2023-01-07 MED ORDER — INSULIN ASPART 100 UNIT/ML IJ SOLN: 0.0000 [IU] | INTRAMUSCULAR | Status: DC

## 2023-01-07 MED ORDER — SODIUM CHLORIDE 0.9% FLUSH: 3.0000 mL | Freq: Once | INTRAVENOUS | Status: DC

## 2023-01-07 MED ORDER — GADOBUTROL 1 MMOL/ML IV SOLN
7.0000 mL | Freq: Once | INTRAVENOUS | Status: AC | PRN
  Administered 2023-01-07: 7 mL via INTRAVENOUS

## 2023-01-07 NOTE — Telephone Encounter (Signed)
If low blood pressure and facial numbness - needs to be evaluated.

## 2023-01-07 NOTE — ED Triage Notes (Signed)
Pt state that starting this past Friday he started experiencing facial numbness off and on on right side. Pt states he looked in mirror and did not notice any drooping and had full movement. Pt states it resolves within 15 minutes. Pt states at the time this happens he checks his bp and it is low. Pt states he has checked his bp each of the 4 times this has happened and it was reading low. Around 100/68, pt takes midrodine when his bp is low.

## 2023-01-07 NOTE — ED Provider Notes (Signed)
[facility] Provider Note    Event Date/Time   First MD Initiated Contact with Patient 01/07/23 [phone]     (approximate)   History   Numbness (R facial numbness) and facial numbness   HPI  Nicoli Nardozzi Vasconcelos is a 52 y.o. male history of type 1 diabetes syncope alcohol dependence paraplegia status post C5 C7 injury remotely as well as reported history of CHF presents to the ER for evaluation of 5 days of right facial numbness and tingling.  Feels like the entire right side of his face is.  No facial droop.  Has never had symptoms like this before.  No other associated weakness.  No neck pain.  No falls.     Physical Exam   Triage Vital Signs: ED Triage Vitals  Enc Vitals Group     BP 01/07/23 1715 (!) 194/105     Pulse Rate 01/07/23 1715 (!) 54     Resp 01/07/23 1715 16     Temp 01/07/23 1715 98.3 F (36.8 C)     Temp Source 01/07/23 1715 Oral     SpO2 01/07/23 1715 98 %     Weight 01/07/23 1716 165 lb (74.8 kg)     Height 01/07/23 1716 6' (1.829 m)     Head Circumference --      Peak Flow --      Pain Score 01/07/23 1716 1     Pain Loc --      Pain Edu? --      Excl. in [place]? --     Most recent vital signs: Vitals:   01/07/23 1915 01/07/23 2252  BP:  130/86  Pulse: (!) 50 (!) 51  Resp: 11 12  Temp:  98.1 F (36.7 C)  SpO2: 100% 99%     Constitutional: Alert  Eyes: Conjunctivae are normal.  Head: Atraumatic. Nose: No congestion/rhinnorhea. Mouth/Throat: Mucous membranes are moist.   Neck: Painless ROM.  Cardiovascular:   Good peripheral circulation. Respiratory: Normal respiratory effort.  No retractions.  Gastrointestinal: Soft and nontender.  Musculoskeletal:  no deformity Neurologic: There is decrease in sensation to light touch in the right side, but no neglect.  Motor intact.  Remainder of cranial nerves are intact.  Good motor strength upper extremities.  No new focal deficits in the lower extremity Skin:  Skin is warm, dry  and intact. No rash noted. Psychiatric: Mood and affect are normal. Speech and behavior are normal.    ED Results / Procedures / Treatments   Labs (all labs ordered are listed, but only abnormal results are displayed) Labs Reviewed  APTT - Abnormal; Notable for the following components:      Result Value   aPTT 38 (*)    All other components within normal limits  CBC - Abnormal; Notable for the following components:   RBC 3.67 (*)    Hemoglobin 11.7 (*)    HCT 34.3 (*)    All other components within normal limits  COMPREHENSIVE METABOLIC PANEL - Abnormal; Notable for the following components:   Sodium 131 (*)    Chloride 97 (*)    Glucose, Bld 156 (*)    Calcium 8.8 (*)    All other components within normal limits  PROTIME-INR  DIFFERENTIAL  ETHANOL  CBG MONITORING, ED  CBG MONITORING, ED     EKG  ED ECG REPORT I, Merlyn Lot, the attending physician, personally viewed and interpreted this ECG.   Date: 01/07/2023  EKG Time: 17:29  Rate: 60  Rhythm: sinus  Axis: normal  Intervals: normal  ST&T Change: no stemi, no depressions    RADIOLOGY Please see ED Course for my review and interpretation.  I personally reviewed all radiographic images ordered to evaluate for the above acute complaints and reviewed radiology reports and findings.  These findings were personally discussed with the patient.  Please see medical record for radiology report.    PROCEDURES:  Critical Care performed: No  Procedures   MEDICATIONS ORDERED IN ED: Medications  sodium chloride flush (NS) 0.9 % injection 3 mL (3 mLs Intravenous Not Given 01/07/23 1834)  insulin aspart (novoLOG) injection 0-15 Units (has no administration in time range)     IMPRESSION / MDM / ASSESSMENT AND PLAN / ED COURSE  I reviewed the triage vital signs and the nursing notes.                              Differential diagnosis includes, but is not limited to, cva, tia, hypoglycemia, dehydration,  electrolyte abnormality, dissection, sepsis   Patient presenting to the ER for evaluation of symptoms as described above.  Based on symptoms, risk factors and considered above differential, this presenting complaint could reflect a potentially life-threatening illness therefore the patient will be placed on continuous pulse oximetry and telemetry for monitoring.  Laboratory evaluation will be sent to evaluate for the above complaints.      Clinical Course as of 01/07/23 2312  Thu Jan 07, 2023  2306 Patient was signed out to oncoming physician pending follow-up MRI. [PR]  2306 Anticipate discharge home if negative. [PR]    Clinical Course User Index [PR] Merlyn Lot, MD    FINAL CLINICAL IMPRESSION(S) / ED DIAGNOSES   Final diagnoses:  Paresthesia of skin of face due to infraorbital nerve anesthetic procedure     Rx / DC Orders   ED Discharge Orders     None        Note:  This document was prepared using Dragon voice recognition software and may include unintentional dictation errors.    Merlyn Lot, MD 01/07/23 2312

## 2023-01-07 NOTE — Telephone Encounter (Signed)
Pt advised Agreeable to go over to [facility]  Will go today

## 2023-01-07 NOTE — Telephone Encounter (Signed)
  S/w pt - stating his blood pressure has been dropping low at least once every day,  and the whole R side of face goes completely numb.  Today his bp dropped to 79/45. Once he takes medication and Bp comes up, the numbness goes away and he is fine.  Pt concerned he is having mini strokes.

## 2023-01-07 NOTE — ED Notes (Signed)
Cbg 193 per pt's device, pt refused finger stick

## 2023-01-07 NOTE — ED Notes (Signed)
8oz orange juice and pack of graham crackers given per MD request.

## 2023-01-08 LAB — CBG MONITORING, ED: Glucose-Capillary: 118 mg/dL — ABNORMAL HIGH (ref 70–99)

## 2023-01-08 NOTE — ED Provider Notes (Signed)
Patient received in signout from Dr. Quentin Cornwall pending MRI brain to assess for facial paresthesias.  This is negative.  Will discharge per original plan of care with PCP follow-up and return precautions for the ED.   Vladimir Crofts, MD 01/08/23 [phone]

## 2023-01-11 ENCOUNTER — Telehealth: Payer: Self-pay

## 2023-01-11 NOTE — Telephone Encounter (Signed)
Raquel Sarna is calling from [employer] to follow-up on a physical therapy referral form for patient which was faxed on 12/29/2022.

## 2023-01-12 NOTE — Telephone Encounter (Signed)
Placed in box.  Confirm with pt - ok with proceeding.

## 2023-01-12 NOTE — Telephone Encounter (Signed)
Form placed in your folder for signature.

## 2023-01-13 NOTE — Telephone Encounter (Signed)
Faxed form to [employer].

## 2023-01-13 NOTE — Telephone Encounter (Signed)
Confirmed with patient that he did request this. He is working on getting a new chair and they have to do the eval before they can move forward with next steps.

## 2023-01-13 NOTE — Telephone Encounter (Signed)
Ok.  Signed and placed in box.

## 2023-01-15 ENCOUNTER — Telehealth: Payer: Self-pay

## 2023-01-15 ENCOUNTER — Other Ambulatory Visit: Payer: Self-pay | Admitting: Internal Medicine

## 2023-01-15 NOTE — Telephone Encounter (Signed)
     Patient  visit on 2/2  at [facility]   Have you been able to follow up with your primary care physician? No   The patient was or was not able to obtain any needed medicine or equipment. No   Are there diet recommendations that you are having difficulty following? No   Patient expresses understanding of discharge instructions and education provided has no other needs at this time.  No    [facility] [phone] [address], [place], [place] [address] Phone: [phone] Email: [email or web address]

## 2023-01-28 ENCOUNTER — Encounter: Payer: Self-pay | Admitting: Internal Medicine

## 2023-01-28 ENCOUNTER — Ambulatory Visit: Payer: Medicare Other | Admitting: Internal Medicine

## 2023-01-28 DIAGNOSIS — L89153 Pressure ulcer of sacral region, stage 3: Secondary | ICD-10-CM | POA: Diagnosis not present

## 2023-01-31 NOTE — Telephone Encounter (Signed)
Please call [name] and find out what symptoms he is having.  Confirm he is currently taking omeprazole '20mg'$  bid. This medication is not dosed qid.  Can increase dose of omeprazole or add pepcid, but need to know symptoms currently having.  If having increased break through symptoms despite taking PPI, will need further evaluation and w/up.

## 2023-02-01 ENCOUNTER — Ambulatory Visit (INDEPENDENT_AMBULATORY_CARE_PROVIDER_SITE_OTHER): Payer: Medicare Other | Admitting: Gastroenterology

## 2023-02-01 ENCOUNTER — Encounter: Payer: Self-pay | Admitting: Gastroenterology

## 2023-02-01 VITALS — BP 188/109 | HR 54 | Temp 98.1°F

## 2023-02-01 DIAGNOSIS — K626 Ulcer of anus and rectum: Secondary | ICD-10-CM

## 2023-02-01 MED ORDER — MESALAMINE 1000 MG RE SUPP: 1000.0000 mg | Freq: Two times a day (BID) | RECTAL | 5 refills | Status: DC

## 2023-02-01 NOTE — Telephone Encounter (Signed)
Lm for pt to cb.
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
## 2023-02-05 NOTE — Telephone Encounter (Signed)
LMTCB

## 2023-02-09 ENCOUNTER — Encounter: Payer: Self-pay | Admitting: Urology

## 2023-02-09 ENCOUNTER — Ambulatory Visit: Payer: Medicare Other | Admitting: Urology

## 2023-02-09 VITALS — BP 213/133 | HR 54 | Ht 72.0 in | Wt 160.0 lb

## 2023-02-09 DIAGNOSIS — N3000 Acute cystitis without hematuria: Secondary | ICD-10-CM | POA: Diagnosis not present

## 2023-02-09 DIAGNOSIS — N200 Calculus of kidney: Secondary | ICD-10-CM | POA: Diagnosis not present

## 2023-02-09 DIAGNOSIS — N319 Neuromuscular dysfunction of bladder, unspecified: Secondary | ICD-10-CM | POA: Diagnosis not present

## 2023-02-09 DIAGNOSIS — Z87440 Personal history of urinary (tract) infections: Secondary | ICD-10-CM | POA: Diagnosis not present

## 2023-02-09 DIAGNOSIS — N390 Urinary tract infection, site not specified: Secondary | ICD-10-CM

## 2023-02-09 LAB — BLADDER SCAN AMB NON-IMAGING: Scan Result: 57

## 2023-02-09 LAB — MICROSCOPIC EXAMINATION: WBC, UA: >30 /hpf — AB (ref 0–5)

## 2023-02-09 LAB — URINALYSIS, COMPLETE
Bilirubin, UA: NEGATIVE
Glucose, UA: NEGATIVE
Ketones, UA: NEGATIVE
Nitrite, UA: NEGATIVE
RBC, UA: NEGATIVE
Specific Gravity, UA: 1.02 (ref 1.005–1.030)
Urobilinogen, Ur: 0.2 mg/dL (ref 0.2–1.0)
pH, UA: 7 (ref 5.0–7.5)

## 2023-02-09 MED ORDER — METHENAMINE MANDELATE 1 G PO TABS: 1000.0000 mg | ORAL_TABLET | Freq: Two times a day (BID) | ORAL | 11 refills | Status: DC
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
## 2023-02-16 ENCOUNTER — Telehealth: Payer: Self-pay

## 2023-02-16 DIAGNOSIS — S14105S Unspecified injury at C5 level of cervical spinal cord, sequela: Secondary | ICD-10-CM

## 2023-02-16 NOTE — Telephone Encounter (Signed)
Updated referral,.

## 2023-02-16 NOTE — Therapy (Signed)
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                                                     Armrests  '[]'$ fixed '[]'$ adjustable height  '[x]'$ removable   '[x]'$ swing away  '[]'$ flip back   '[]'$ reclining '[]'$ full length pads '[]'$ desk    '[]'$ pads tubular  '[x]'$ provide support with elbow at 90   '[]'$ provide support for w/c tray '[]'$ change of height/angles for variable activities '[x]'$ remove for transfers '[x]'$ allow to come closer to table top '[x]'$ remove for access to tables '[]'$                                               Hangers/ Leg rests  '[]'$ 60 '[]'$ 70 '[]'$ 90 '[]'$ elevating '[]'$ heavy duty  '[]'$ articulating '[]'$ fixed '[]'$ lift off '[]'$ swing away     '[]'$ power '[]'$ provide LE support  '[]'$ accommodate to hamstring tightness '[]'$ elevate legs during recline   '[]'$ provide change in position for Legs '[]'$ Maintain placement of feet on footplate '[]'$ durability '[]'$ enable transfers '[]'$ decrease edema '[]'$ Accommodate lower leg length '[]'$                                         Foot support Footplate    '[]'$ Lt  '[]'$  Rt  '[x]'$  Center mount '[]'$ flip up                            '[x]'$ depth/angle adjustable '[]'$ Amputee adapter    '[]'$  Lt     '[]'$  Rt '[x]'$ provide foot support '[]'$ accommodate to ankle ROM '[]'$ transfers '[]'$ Provide support for residual extremity '[]'$  allow foot to go under wheelchair base '[]'$  decrease tone  '[]'$                                                 '[x]'$  Ankle strap/heel loops/ Calf Strap '[]'$ support foot on foot support '[x]'$ decrease extraneous movement behind wheel '[]'$ provide input to heel  '[]'$ protect foot  Tires: '[x]'$ pneumatic  '[]'$ flat free inserts  '[]'$ solid  Spinergy LX '[x]'$ decrease maintenance  '[x]'$ prevent frequent flats '[x]'$ increase shock absorbency '[x]'$ decrease pain from road shock '[]'$ decrease  spasms from road shock '[]'$                                              '[]'$  Headrest  '[]'$ provide posterior head support '[]'$ provide posterior neck support '[]'$ provide lateral head support '[]'$ provide anterior head support '[]'$ support during tilt and recline '[]'$ improve feeding   '[]'$ improve respiration '[]'$ placement of switches '[]'$ safety  '[]'$ accommodate ROM  '[]'$ accommodate tone '[]'$ improve visual orientation  '[]'$  Anterior chest strap '[]'$  Vest '[]'$  Shoulder retractors  '[]'$ decrease forward movement of shoulder '[]'$ accommodation of TLSO '[]'$ decrease forward movement of trunk '[]'$ decrease shoulder elevation '[]'$ added abdominal support '[]'$ alignment '[]'$ assistance with shoulder control  '[]'$   Pelvic Positioner '[]'$ Belt '[]'$ SubASIS bar '[]'$ Dual Pull *Pt declines seat belt '[]'$ stabilize tone '[]'$ decrease falling out of chair/ **will not Decrease potential for sliding due to pelvic tilting '[]'$ prevent excessive rotation '[]'$ pad for protection over boney prominence '[]'$ prominence comfort '[]'$ special pull angle to control rotation '[]'$                                                  Upper ExtremitySupport  '[]'$ L   '[]'$  R '[]'$ Arm trough   '[]'$ hand support '[]'$  tray       '[]'$ full tray '[]'$ swivel mount '[]'$ decrease edema      '[]'$ decrease subluxation   '[]'$ control tone   '[]'$ placement for AAC/Computer/EADL '[]'$ decrease gravitational pull on shoulders '[]'$ provide midline positioning '[]'$ provide support to increase UE function '[]'$ provide hand support in natural position '[]'$ provide work surface   POWER WHEELCHAIR CONTROLS  '[]'$ Proportional  '[]'$ Non-Proportional Type                                      '[]'$ Left  '[]'$ Right '[]'$ provides access for controlling wheelchair   '[]'$ lacks motor control to operate proportional drive control '[]'$ unable to understand proportional controls  Actuator Control Module  '[]'$ Single  '[]'$ Multiple   '[]'$ Allow the client to operate the power seat function(s) through the joystick control   '[]'$ Safety Reset Switches  '[]'$ Used to change modes and stop the wheelchair when driving in latch mode    '[]'$ Upgraded Electronics   '[]'$ programming for accurate control '[]'$ progressive Disease/changing condition '[]'$ non-proportional drive control needed '[]'$ Needed in order to operate power seat functions through joystick control   '[]'$ Display box '[]'$ Allows user to see in which mode and drive the wheelchair is set  '[]'$ necessary for alternate controls    '[]'$ Digital interface electronics '[]'$ Allows w/c to operate when using alternative drive controls  '[]'$ ASL Head Array '[]'$ Allows client to operate wheelchair  through switches placed in tri-panel headrest  '[]'$ Sip and puff with tubing kit '[]'$ needed to operate sip and puff drive controls  '[]'$ Upgraded tracking electronics '[]'$ increase safety when driving '[]'$ correct tracking when on uneven surfaces  '[]'$ Mount for switches or joystick '[]'$ Attaches switches to w/c  '[]'$ Swing away for access or transfers '[]'$ midline for optimal placement '[]'$ provides for consistent access  '[]'$ Attendant controlled joystick plus mount '[]'$ safety '[]'$ long distance driving '[]'$ operation of seat functions '[]'$ compliance with transportation regulations '[]'$                                             Rear wheel placement/Axle adjustability '[]'$ None '[]'$ semi adjustable '[x]'$ fully adjustable  '[x]'$ improved UE access to wheels '[x]'$ improved stability '[]'$ changing angle in space for improvement of postural stability '[]'$ 1-arm drive access '[]'$ amputee pad placement '[]'$                                Wheel rims/ hand rims  '[x]'$ metal   '[]'$ plastic coated '[]'$ oblique projections           '[]'$ vertical projections '[x]'$ Provide ability to propel manual wheelchair  '[x]'$  Increase self-propulsion with hand weakness/decreased grasp  Push handles '[]'$ extended   '[]'$ angle adjustable              '[]'$ standard '[]'$ caregiver access '[]'$ caregiver assist '[]'$ allows "hooking"  to enable increased ability to perform ADLs or maintain balance  One armed device   '[]'$ Lt   '[]'$ Rt '[]'$ enable propulsion of  manual wheelchair with one arm   '[]'$                                            Brake/wheel lock extension '[x]'$  Lt   '[x]'$  Rt '[x]'$ increase indep in applying wheel locks   '[x]'$ Side guards '[x]'$ prevent clothing getting caught in wheel or becoming soiled '[x]'$  prevent skin tears/abrasions  Battery:                                            '[]'$ to power wheelchair                                                         Other:              Reinforced Frame                                  TO transfer Power assist device form current chair to new chair                                                                          The above equipment has a life- long use expectancy. Growth and changes in medical and/or functional conditions would be the exceptions. This is to certify that the therapist has no financial relationship with durable medical provider or manufacturer. The therapist will not receive remuneration of any kind for the equipment recommended in this evaluation.   Patient has mobility limitation that significantly impairs safe, timely participation in one or more mobility related ADL's. (bathing, toileting, feeding, dressing, grooming, moving from room to room)  '[x]'$  Yes '[]'$  No  Will mobility device sufficiently improve ability to participate and/or be aided in participation of MRADL's?      '[x]'$  Yes '[]'$  No  Can limitation be compensated for with use of a cane or walker?                                    '[]'$  Yes '[x]'$  No  Does patient or caregiver demonstrate ability/potential ability & willingness to safely use the mobility device?    '[x]'$  Yes '[]'$  No  Does patient's home environment support use of recommended mobility device?            '[x]'$  Yes '[]'$  No  Does patient have sufficient upper extremity function necessary to functionally propel a manual wheelchair?     '[]'$  Yes '[]'$  No  Does patient have sufficient strength and trunk stability to safely operate a POV (scooter)?                                  '[]'$   Yes '[x]'$  No  Does patient need additional features/benefits provided by a power wheelchair for MRADL's in the home?        '[]'$  Yes '[]'$  No  Does the patient demonstrate the ability to safely use a power wheelchair?                   '[x]'$  Yes '[]'$  No     Physician's Name Printed:                                                        45 Signature:  Date:     This is to certify that I, the above signed therapist have the following affiliations: '[x]'$  This DME provider '[x]'$  Manufacturer of recommended equipment '[x]'$  Patient's long term care facility '[x]'$  None of the above  Therapist Name/Signature:               Rivka Barbara PT, DPT                              Date: 02/18/23  ASSESSMENT:  CLINICAL IMPRESSION: Patient is a 52 y.o. male who was seen today for physical therapy wheelchair evaluation.  Patient's current wheelchair has served him well but it is in need of repairs in various areas and most notable are the cushion and brakes both posing safety risk.  Cushion is not adequate for pressure distribution needed and has likely led to current pressure injury which is being treated.  Brace not adequate for safe stopping and needed to which could result in injury.  Many other parts of wheelchair as noted in exam or not in proper working order and thus justify need for new and updated equipment.  Please see above documentation for justifications of each component of wheelchair.  The above recommendations will improve patient's safety, mobility, pressure relief and distribution for sacral area, and overall improve his quality of life and reduce risk for long-term complications.  OBJECTIVE IMPAIRMENTS: decreased activity tolerance, decreased balance, decreased mobility, difficulty walking, decreased ROM, decreased strength, and hypomobility.   ACTIVITY LIMITATIONS: standing and transfers  PARTICIPATION LIMITATIONS: meal prep, cleaning, laundry, driving, shopping, and community  activity  PERSONAL FACTORS: Time since onset of injury/illness/exacerbation are also affecting patient's functional outcome.   REHAB POTENTIAL: Good  CLINICAL DECISION MAKING: Stable/uncomplicated  EVALUATION COMPLEXITY: Low  GOALS: Target date: 03/04/2023    Pt and caregivers will understand PT recommendation and appropriate/safe use for wheelchair and seating for home use. Baseline:  Goal status: INITIAL   PLAN:  PT FREQUENCY: 1x/week  PT DURATION: 1 week  PLANNED INTERVENTIONS: Therapeutic exercises, Therapeutic activity, Neuromuscular re-education, Balance training, Gait training, Patient/Family education, Self Care, Joint mobilization, and Wheelchair mobility training.  PLAN FOR NEXT SESSION: N/A Wheelchair eval only    Particia Lather, PT 02/18/2023, 2:00 PM

## 2023-02-18 ENCOUNTER — Ambulatory Visit: Payer: Medicare Other | Attending: Internal Medicine | Admitting: Physical Therapy

## 2023-02-18 ENCOUNTER — Encounter: Payer: Self-pay | Admitting: Physical Therapy

## 2023-02-18 DIAGNOSIS — R2689 Other abnormalities of gait and mobility: Secondary | ICD-10-CM | POA: Insufficient documentation

## 2023-02-18 DIAGNOSIS — M6281 Muscle weakness (generalized): Secondary | ICD-10-CM | POA: Diagnosis not present

## 2023-02-19 ENCOUNTER — Other Ambulatory Visit: Payer: Self-pay | Admitting: Internal Medicine

## 2023-02-22 NOTE — Telephone Encounter (Signed)
Rx ok'd for refill of baclofen

## 2023-02-25 ENCOUNTER — Ambulatory Visit: Payer: Medicare Other | Admitting: Physician Assistant

## 2023-02-26 ENCOUNTER — Encounter: Payer: Self-pay | Admitting: Internal Medicine

## 2023-03-01 ENCOUNTER — Encounter: Payer: Medicare Other | Attending: Physician Assistant | Admitting: Physician Assistant

## 2023-03-01 DIAGNOSIS — T8189XA Other complications of procedures, not elsewhere classified, initial encounter: Secondary | ICD-10-CM | POA: Insufficient documentation

## 2023-03-01 DIAGNOSIS — E1065 Type 1 diabetes mellitus with hyperglycemia: Secondary | ICD-10-CM | POA: Diagnosis not present

## 2023-03-01 DIAGNOSIS — G8254 Quadriplegia, C5-C7 incomplete: Secondary | ICD-10-CM | POA: Diagnosis not present

## 2023-03-01 DIAGNOSIS — E10621 Type 1 diabetes mellitus with foot ulcer: Secondary | ICD-10-CM | POA: Insufficient documentation

## 2023-03-01 DIAGNOSIS — I5042 Chronic combined systolic (congestive) and diastolic (congestive) heart failure: Secondary | ICD-10-CM | POA: Insufficient documentation

## 2023-03-01 DIAGNOSIS — E1051 Type 1 diabetes mellitus with diabetic peripheral angiopathy without gangrene: Secondary | ICD-10-CM | POA: Diagnosis not present

## 2023-03-01 DIAGNOSIS — I110 Hypertensive heart disease with heart failure: Secondary | ICD-10-CM | POA: Diagnosis not present

## 2023-03-01 DIAGNOSIS — L987 Excessive and redundant skin and subcutaneous tissue: Secondary | ICD-10-CM | POA: Diagnosis not present

## 2023-03-01 DIAGNOSIS — L89153 Pressure ulcer of sacral region, stage 3: Secondary | ICD-10-CM | POA: Diagnosis not present

## 2023-03-01 NOTE — Progress Notes (Signed)
VANNIE, CHAPLA ([id number]) [id number] Page 1 of 7 Visit Report for 03/01/2023 Arrival Information Details Patient Name: Date of Service: [name], [name]. 03/01/2023 2:45 PM Medical Record Number: [id number] Patient Account Number: [id number] Date of Birth/Sex: Treating RN: 1971-07-09 (52 y.o. [name] Primary Care Ameirah Khatoon: Einar Pheasant Other Clinician: Massie Kluver Referring Josh Nicolosi: Treating Darris Carachure/Extender: Lavonia Dana Weeks in Treatment: 70 Visit Information History Since Last Visit All ordered tests and consults were completed: No Patient Arrived: Wheel Chair Added or deleted any medications: No Arrival Time: 15:24 Any new allergies or adverse reactions: No Transfer Assistance: Transfer Board Had a fall or experienced change in No Patient Identification Verified: Yes activities of daily living that may affect Secondary Verification Process Completed: Yes risk of falls: Patient Requires Transmission-Based Precautions: No Signs or symptoms of abuse/neglect since last visito No Patient Has Alerts: No Hospitalized since last visit: No Implantable device outside of the clinic excluding No cellular tissue based products placed in the center since last visit: Has Dressing in Place as Prescribed: Yes Pain Present Now: Yes Electronic Signature(s) Signed: 03/01/2023 5:19:29 PM By: Massie Kluver Entered By: Massie Kluver on 03/01/2023 15:33:45 -------------------------------------------------------------------------------- Clinic Level of Care Assessment Details Patient Name: Date of Service: [name], [name] 03/01/2023 2:45 PM Medical Record Number: [id number] Patient Account Number: [id number] Date of Birth/Sex: Treating RN: Feb 05, 1971 (52 y.o. [name], Maudie Mercury Primary Care Wilford Merryfield: Einar Pheasant Other Clinician: Massie Kluver Referring Varnell Donate: Treating Dare Spillman/Extender: Lavonia Dana Weeks in  Treatment: 60 Clinic Level of Care Assessment Items TOOL 4 Quantity Score []  - 0 Use when only an EandM is performed on FOLLOW-UP visit ASSESSMENTS - Nursing Assessment / Reassessment X- 1 10 Reassessment of Co-morbidities (includes updates in patient status) X- 1 5 Reassessment of Adherence to Treatment Plan ASSESSMENTS - Wound and Skin A ssessment / Reassessment X - Simple Wound Assessment / Reassessment - one wound 1 5 []  - 0 Complex Wound Assessment / Reassessment - multiple wounds []  - 0 Dermatologic / Skin Assessment (not related to wound area) ASSESSMENTS - Focused Assessment []  - 0 Circumferential Edema Measurements - multi extremities []  - 0 Nutritional Assessment / Counseling / Intervention []  - 0 Lower Extremity Assessment (monofilament, tuning fork, pulses) []  - 0 Peripheral Arterial Disease Assessment (using hand held doppler) ASSESSMENTS - Ostomy and/or Continence Assessment and Care []  - 0 Incontinence Assessment and Management []  - 0 Ostomy Care Assessment and Management (repouching, etc.) PROCESS - Coordination of Care ORPHEUS, SCHMICK ([id number]) [phone].pdf Page 2 of 7 X- 1 15 Simple Patient / Family Education for ongoing care []  - 0 Complex (extensive) Patient / Family Education for ongoing care []  - 0 Staff obtains Programmer, systems, Records, T Results / Process Orders est []  - 0 Staff telephones HHA, Nursing Homes / Clarify orders / etc []  - 0 Routine Transfer to another Facility (non-emergent condition) []  - 0 Routine Hospital Admission (non-emergent condition) []  - 0 New Admissions / Biomedical engineer / Ordering NPWT Apligraf, etc. , []  - 0 Emergency Hospital Admission (emergent condition) X- 1 10 Simple Discharge Coordination []  - 0 Complex (extensive) Discharge Coordination PROCESS - Special Needs []  - 0 Pediatric / Minor Patient Management []  - 0 Isolation Patient Management []  - 0 Hearing / Language /  Visual special needs []  - 0 Assessment of Community assistance (transportation, D/C planning, etc.) []  - 0 Additional assistance / Altered mentation []  - 0 Support Surface(s) Assessment (bed, cushion, seat, etc.) INTERVENTIONS - Wound  Cleansing / Measurement X - Simple Wound Cleansing - one wound 1 5 []  - 0 Complex Wound Cleansing - multiple wounds X- 1 5 Wound Imaging (photographs - any number of wounds) []  - 0 Wound Tracing (instead of photographs) X- 1 5 Simple Wound Measurement - one wound []  - 0 Complex Wound Measurement - multiple wounds INTERVENTIONS - Wound Dressings []  - 0 Small Wound Dressing one or multiple wounds X- 1 15 Medium Wound Dressing one or multiple wounds []  - 0 Large Wound Dressing one or multiple wounds []  - 0 Application of Medications - topical []  - 0 Application of Medications - injection INTERVENTIONS - Miscellaneous []  - 0 External ear exam []  - 0 Specimen Collection (cultures, biopsies, blood, body fluids, etc.) []  - 0 Specimen(s) / Culture(s) sent or taken to Lab for analysis []  - 0 Patient Transfer (multiple staff / Civil Service fast streamer / Similar devices) []  - 0 Simple Staple / Suture removal (25 or less) []  - 0 Complex Staple / Suture removal (26 or more) []  - 0 Hypo / Hyperglycemic Management (close monitor of Blood Glucose) []  - 0 Ankle / Brachial Index (ABI) - do not check if billed separately []  - 0 Vital Signs Has the patient been seen at the hospital within the last three years: Yes Total Score: 75 Level Of Care: New/Established - Level 2 Electronic Signature(s) Signed: 03/01/2023 5:19:29 PM By: Mardene Celeste, Lahoma Rocker ([id number]) [id number] Page 3 of 7 Entered By: Massie Kluver on 03/01/2023 16:06:43 -------------------------------------------------------------------------------- Encounter Discharge Information Details Patient Name: Date of Service: [name], [name] 03/01/2023 2:45 PM Medical  Record Number: [id number] Patient Account Number: [id number] Date of Birth/Sex: Treating RN: 1970/12/25 (52 y.o. [name] Primary Care Mackena Plummer: Einar Pheasant Other Clinician: Massie Kluver Referring Jamani Bearce: Treating Dierks Wach/Extender: Lavonia Dana Weeks in Treatment: 66 Encounter Discharge Information Items Discharge Condition: Stable Ambulatory Status: Wheelchair Discharge Destination: Home Transportation: Other Accompanied By: self Schedule Follow-up Appointment: Yes Clinical Summary of Care: Electronic Signature(s) Signed: 03/01/2023 5:19:29 PM By: Massie Kluver Entered By: Massie Kluver on 03/01/2023 17:12:59 -------------------------------------------------------------------------------- Lower Extremity Assessment Details Patient Name: Date of Service: [name], [name] 03/01/2023 2:45 PM Medical Record Number: [id number] Patient Account Number: [id number] Date of Birth/Sex: Treating RN: [birth year] (52 y.o. [name] Primary Care Windsor Goeken: Einar Pheasant Other Clinician: Massie Kluver Referring Rashan Rounsaville: Treating Priscillia Fouch/Extender: Lavonia Dana Weeks in Treatment: 57 Electronic Signature(s) Signed: 03/01/2023 4:47:10 PM By: Gretta Cool BSN, RN, CWS, Kim RN, BSN Signed: 03/01/2023 5:19:29 PM By: Massie Kluver Entered By: Massie Kluver on 03/01/2023 15:40:06 -------------------------------------------------------------------------------- Multi Wound Chart Details Patient Name: Date of Service: [name] Russell K. 03/01/2023 2:45 PM Medical Record Number: [id number] Patient Account Number: [id number] Date of Birth/Sex: Treating RN: 1971/05/01 (52 y.o. [name] Primary Care Nilam Quakenbush: Einar Pheasant Other Clinician: Massie Kluver Referring Finnlee Silvernail: Treating Verley Pariseau/Extender: Lavonia Dana Weeks in Treatment: 26 [8:Photos:] [N/A:N/A] Midline Sacrum N/A N/A Wound Location: Gradually Appeared N/A N/A Wounding  Event: Pressure Ulcer N/A N/A Primary Etiology: Congestive Heart Failure, Type I N/A N/A Comorbid History: Diabetes, History of pressure wounds, Paraplegia Badie, Lahoma Rocker ([id number]) [phone].pdf Page 4 of 7 05/07/2021 N/A N/A Date Acquired: 75 N/A N/A Weeks of Treatment: Open N/A N/A Wound Status: No N/A N/A Wound Recurrence: 1.3x1.2x0.5 N/A N/A Measurements L x W x D (cm) 1.225 N/A N/A A (cm) : rea 0.613 N/A N/A Volume (cm) : 77.70% N/A N/A % Reduction in A rea: 72.10% N/A N/A %  Reduction in Volume: Category/Stage III N/A N/A Classification: Medium N/A N/A Exudate A mount: Serosanguineous N/A N/A Exudate Type: red, brown N/A N/A Exudate Color: Thickened N/A N/A Wound Margin: Large (67-100%) N/A N/A Granulation A mount: Red N/A N/A Granulation Quality: None Present (0%) N/A N/A Necrotic A mount: Fat Layer (Subcutaneous Tissue): Yes N/A N/A Exposed Structures: Fascia: No Tendon: No Muscle: No Joint: No Bone: No None N/A N/A Epithelialization: Treatment Notes Electronic Signature(s) Signed: 03/01/2023 5:19:29 PM By: Massie Kluver Entered By: Massie Kluver on 03/01/2023 15:40:10 -------------------------------------------------------------------------------- Multi-Disciplinary Care Plan Details Patient Name: Date of Service: [name] Baton EN K. 03/01/2023 2:45 PM Medical Record Number: [id number] Patient Account Number: [id number] Date of Birth/Sex: Treating RN: [birth year] (52 y.o. [name] Primary Care Khush Pasion: Einar Pheasant Other Clinician: Massie Kluver Referring Suresh Audi: Treating Taris Galindo/Extender: Lavonia Dana Weeks in Treatment: 50 Active Inactive Wound/Skin Impairment Nursing Diagnoses: Knowledge deficit related to ulceration/compromised skin integrity Goals: Patient/caregiver will verbalize understanding of skin care regimen Date Initiated: 11/27/2021 Target Resolution Date:  12/28/2021 Goal Status: Active Ulcer/skin breakdown will have a volume reduction of 30% by week 4 Date Initiated: 11/27/2021 Target Resolution Date: 01/28/2022 Goal Status: Active Ulcer/skin breakdown will have a volume reduction of 50% by week 8 Date Initiated: 11/27/2021 Target Resolution Date: 02/25/2022 Goal Status: Active Ulcer/skin breakdown will have a volume reduction of 80% by week 12 Date Initiated: 11/27/2021 Target Resolution Date: 03/28/2022 Goal Status: Active Ulcer/skin breakdown will heal within 14 weeks Date Initiated: 11/27/2021 Target Resolution Date: 04/27/2022 Goal Status: Active Interventions: Assess patient/caregiver ability to obtain necessary supplies Assess patient/caregiver ability to perform ulcer/skin care regimen upon admission and as needed Assess ulceration(s) every visit Notes: [name], [name] ([id number]) [phone].pdf Page 5 of 7 Electronic Signature(s) Signed: 03/01/2023 4:47:10 PM By: Gretta Cool, BSN, RN, CWS, Kim RN, BSN Signed: 03/01/2023 5:19:29 PM By: Massie Kluver Entered By: Massie Kluver on 03/01/2023 16:06:52 -------------------------------------------------------------------------------- Pain Assessment Details Patient Name: Date of Service: [name] Russell K. 03/01/2023 2:45 PM Medical Record Number: [id number] Patient Account Number: [id number] Date of Birth/Sex: Treating RN: [birth year] (52 y.o. [name] Primary Care Chaka Jefferys: Einar Pheasant Other Clinician: Massie Kluver Referring Jaci Desanto: Treating Tyreonna Czaplicki/Extender: Lavonia Dana Weeks in Treatment: 68 Active Problems Location of Pain Severity and Description of Pain Patient Has Paino Yes Site Locations Pain Location: Pain in Ulcers With Dressing Change: No Duration of the Pain. Constant / Intermittento Intermittent Rate the pain. Current Pain Level: 1 Worst Pain Level: 8 Character of Pain Describe the Pain: Sharp, Shooting,  Throbbing Pain Management and Medication Current Pain Management: Medication: Yes Cold Application: No Rest: No Massage: No Activity: No T.E.N.S.: No Heat Application: No Leg drop or elevation: No Is the Current Pain Management Adequate: Inadequate Electronic Signature(s) Signed: 03/01/2023 4:47:10 PM By: Gretta Cool, BSN, RN, CWS, Kim RN, BSN Signed: 03/01/2023 5:19:29 PM By: Massie Kluver Entered By: Massie Kluver on 03/01/2023 15:35:00 -------------------------------------------------------------------------------- Patient/Caregiver Education Details Patient Name: Date of Service: [name] 3/25/2024andnbsp2:45 PM Medical Record Number: [id number] Patient Account Number: [id number] Date of Birth/Gender: Treating RN: 1971/03/22 (52 y.o. [name] Primary Care Physician: Einar Pheasant Other Clinician: Massie Kluver Referring Physician: Treating Physician/Extender: Lavonia Dana Weeks in Treatment: 39 Education Assessment Education Provided To: Patient [name], [name] ([id number]) [phone].pdf Page 6 of 7 Education Topics Provided Wound/Skin Impairment: Handouts: Other: continue wound care as directed Methods: Explain/Verbal Responses: State content correctly Electronic Signature(s) Signed: 03/01/2023 5:19:29 PM By: Massie Kluver Entered By: Clifton James,  Angie on 03/01/2023 17:10:54 -------------------------------------------------------------------------------- Wound Assessment Details Patient Name: Date of Service: [name], [name]. 03/01/2023 2:45 PM Medical Record Number: [id number] Patient Account Number: [id number] Date of Birth/Sex: Treating RN: February 12, 1971 (52 y.o. [name], Maudie Mercury Primary Care Edahi Kroening: Einar Pheasant Other Clinician: Massie Kluver Referring Dominick Zertuche: Treating Aleksa Catterton/Extender: Lavonia Dana Weeks in Treatment: 65 Wound Status Wound Number: 8 Primary Pressure Ulcer Etiology: Wound  Location: Midline Sacrum Wound Open Wounding Event: Gradually Appeared Status: Date Acquired: 05/07/2021 Comorbid Congestive Heart Failure, Type I Diabetes, History of pressure Weeks Of Treatment: 65 History: wounds, Paraplegia Clustered Wound: No Photos Wound Measurements Length: (cm) 1.3 Width: (cm) 1.2 Depth: (cm) 0.5 Area: (cm) 1.225 Volume: (cm) 0.613 % Reduction in Area: 77.7% % Reduction in Volume: 72.1% Epithelialization: None Wound Description Classification: Category/Stage III Wound Margin: Thickened Exudate Amount: Medium Exudate Type: Serosanguineous Exudate Color: red, brown Foul Odor After Cleansing: No Slough/Fibrino No Wound Bed Granulation Amount: Large (67-100%) Exposed Structure Granulation Quality: Red Fascia Exposed: No Necrotic Amount: None Present (0%) Fat Layer (Subcutaneous Tissue) Exposed: Yes Tendon Exposed: No Muscle Exposed: No Joint Exposed: No Bone Exposed: No Treatment Notes Wound #8 (Sacrum) Wound Laterality: Midline [name], [name] ([id number]) [id number] Page 7 of 7 Cleanser Byram Ancillary Kit - 15 Day Supply Discharge Instruction: Use supplies as instructed; Kit contains: (15) Saline Bullets; (15) 3x3 Gauze; 15 pr Gloves Soap and Water Discharge Instruction: Gently cleanse wound with antibacterial soap, rinse and pat dry prior to dressing wounds Peri-Wound Care Topical Primary Dressing Silvercel Small 2x2 (in/in) Discharge Instruction: Apply Silvercel Small 2x2 (in/in) as instructed Secondary Dressing (BORDER) Zetuvit Plus SILICONE BORDER Dressing 4x4 (in/in) Secured With Compression Wrap Compression Stockings Environmental education officer) Signed: 03/01/2023 4:47:10 PM By: Gretta Cool, BSN, RN, CWS, Kim RN, BSN Signed: 03/01/2023 5:19:29 PM By: Massie Kluver Entered By: Massie Kluver on 03/01/2023 15:39:31
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
## 2023-03-02 DIAGNOSIS — S31809A Unspecified open wound of unspecified buttock, initial encounter: Secondary | ICD-10-CM | POA: Diagnosis not present

## 2023-03-02 DIAGNOSIS — L89153 Pressure ulcer of sacral region, stage 3: Secondary | ICD-10-CM | POA: Diagnosis not present

## 2023-03-04 ENCOUNTER — Other Ambulatory Visit: Payer: Self-pay | Admitting: Internal Medicine

## 2023-03-16 DIAGNOSIS — E109 Type 1 diabetes mellitus without complications: Secondary | ICD-10-CM | POA: Diagnosis not present

## 2023-03-17 DIAGNOSIS — E109 Type 1 diabetes mellitus without complications: Secondary | ICD-10-CM | POA: Diagnosis not present

## 2023-03-23 DIAGNOSIS — E109 Type 1 diabetes mellitus without complications: Secondary | ICD-10-CM | POA: Diagnosis not present

## 2023-03-23 DIAGNOSIS — Z9641 Presence of insulin pump (external) (internal): Secondary | ICD-10-CM | POA: Diagnosis not present

## 2023-03-29 ENCOUNTER — Encounter: Payer: Medicare Other | Attending: Physician Assistant | Admitting: Physician Assistant

## 2023-03-29 DIAGNOSIS — I5042 Chronic combined systolic (congestive) and diastolic (congestive) heart failure: Secondary | ICD-10-CM | POA: Diagnosis not present

## 2023-03-29 DIAGNOSIS — I110 Hypertensive heart disease with heart failure: Secondary | ICD-10-CM | POA: Diagnosis not present

## 2023-03-29 DIAGNOSIS — G8254 Quadriplegia, C5-C7 incomplete: Secondary | ICD-10-CM | POA: Insufficient documentation

## 2023-03-29 DIAGNOSIS — L89153 Pressure ulcer of sacral region, stage 3: Secondary | ICD-10-CM | POA: Diagnosis not present

## 2023-03-29 DIAGNOSIS — E109 Type 1 diabetes mellitus without complications: Secondary | ICD-10-CM | POA: Diagnosis not present
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
## 2023-03-30 DIAGNOSIS — S31809A Unspecified open wound of unspecified buttock, initial encounter: Secondary | ICD-10-CM | POA: Diagnosis not present

## 2023-03-30 DIAGNOSIS — L89153 Pressure ulcer of sacral region, stage 3: Secondary | ICD-10-CM | POA: Diagnosis not present

## 2023-03-30 NOTE — Progress Notes (Signed)
LAWARENCE, MEEK ([id number]) [id number] Page 1 of 7 Visit Report for 03/29/2023 Arrival Information Details Patient Name: Date of Service: JANSON, [name]. 03/29/2023 3:15 PM Medical Record Number: [id number] Patient Account Number: [id number] Date of Birth/Sex: Treating RN: Jul 04, 1971 (52 y.o. [name]) Jettie Pagan, Lyla Son Primary Care Taijon Vink: Dale [place] Other Clinician: Referring Auden Wettstein: Treating Karol Liendo/Extender: Antony Blackbird Weeks in Treatment: 2 Visit Information History Since Last Visit Added or deleted any medications: No Patient Arrived: Wheel Chair Any new allergies or adverse reactions: No Arrival Time: 15:27 Had a fall or experienced change in No Accompanied By: self activities of daily living that may affect Transfer Assistance: None risk of falls: Patient Identification Verified: Yes Signs or symptoms of abuse/neglect since last visito No Secondary Verification Process Completed: Yes Hospitalized since last visit: No Patient Requires Transmission-Based Precautions: No Implantable device outside of the clinic excluding No Patient Has Alerts: No cellular tissue based products placed in the center since last visit: Has Dressing in Place as Prescribed: Yes Pain Present Now: No Electronic Signature(s) Signed: 03/29/2023 4:39:06 PM By: [name] RN Entered By: [name] on 03/29/2023 15:27:57 -------------------------------------------------------------------------------- Clinic Level of Care Assessment Details Patient Name: Date of Service: [name], [name] 03/29/2023 3:15 PM Medical Record Number: [id number] Patient Account Number: [id number] Date of Birth/Sex: Treating RN: 12/22/1970 (52 y.o. [name]) [name] Primary Care Kristine Chahal: Dale [place] Other Clinician: Referring Adiel Mcnamara: Treating Nithin Demeo/Extender: Antony Blackbird Weeks in Treatment: 69 Clinic Level of Care Assessment Items TOOL 4 Quantity  Score X- 1 0 Use when only an EandM is performed on FOLLOW-UP visit ASSESSMENTS - Nursing Assessment / Reassessment X- 1 10 Reassessment of Co-morbidities (includes updates in patient status) X- 1 5 Reassessment of Adherence to Treatment Plan ASSESSMENTS - Wound and Skin A ssessment / Reassessment X - Simple Wound Assessment / Reassessment - one wound 1 5 []  - 0 Complex Wound Assessment / Reassessment - multiple wounds []  - 0 Dermatologic / Skin Assessment (not related to wound area) ASSESSMENTS - Focused Assessment []  - 0 Circumferential Edema Measurements - multi extremities []  - 0 Nutritional Assessment / Counseling / Intervention []  - 0 Lower Extremity Assessment (monofilament, tuning fork, pulses) []  - 0 Peripheral Arterial Disease Assessment (using hand held doppler) ASSESSMENTS - Ostomy and/or Continence Assessment and Care []  - 0 Incontinence Assessment and Management []  - 0 Ostomy Care Assessment and Management (repouching, etc.) PROCESS - Coordination of Care X - Simple Patient / Family Education for ongoing care 1 [address], [place] K ([id number]) [phone].pdf Page 2 of 7 []  - 0 Complex (extensive) Patient / Family Education for ongoing care []  - 0 Staff obtains Chiropractor, Records, T Results / Process Orders est []  - 0 Staff telephones HHA, Nursing Homes / Clarify orders / etc []  - 0 Routine Transfer to another Facility (non-emergent condition) []  - 0 Routine Hospital Admission (non-emergent condition) []  - 0 New Admissions / Manufacturing engineer / Ordering NPWT Apligraf, etc. , []  - 0 Emergency Hospital Admission (emergent condition) X- 1 10 Simple Discharge Coordination []  - 0 Complex (extensive) Discharge Coordination PROCESS - Special Needs []  - 0 Pediatric / Minor Patient Management []  - 0 Isolation Patient Management []  - 0 Hearing / Language / Visual special needs []  - 0 Assessment of Community assistance  (transportation, D/C planning, etc.) []  - 0 Additional assistance / Altered mentation []  - 0 Support Surface(s) Assessment (bed, cushion, seat, etc.) INTERVENTIONS - Wound Cleansing / Measurement X - Simple Wound Cleansing -  one wound 1 5 []  - 0 Complex Wound Cleansing - multiple wounds X- 1 5 Wound Imaging (photographs - any number of wounds) []  - 0 Wound Tracing (instead of photographs) X- 1 5 Simple Wound Measurement - one wound []  - 0 Complex Wound Measurement - multiple wounds INTERVENTIONS - Wound Dressings X - Small Wound Dressing one or multiple wounds 1 10 []  - 0 Medium Wound Dressing one or multiple wounds []  - 0 Large Wound Dressing one or multiple wounds []  - 0 Application of Medications - topical []  - 0 Application of Medications - injection INTERVENTIONS - Miscellaneous []  - 0 External ear exam []  - 0 Specimen Collection (cultures, biopsies, blood, body fluids, etc.) []  - 0 Specimen(s) / Culture(s) sent or taken to Lab for analysis []  - 0 Patient Transfer (multiple staff / Nurse, adult / Similar devices) []  - 0 Simple Staple / Suture removal (25 or less) []  - 0 Complex Staple / Suture removal (26 or more) []  - 0 Hypo / Hyperglycemic Management (close monitor of Blood Glucose) []  - 0 Ankle / Brachial Index (ABI) - do not check if billed separately X- 1 5 Vital Signs Has the patient been seen at the hospital within the last three years: Yes Total Score: 75 Level Of Care: New/Established - Level 2 Electronic Signature(s) Signed: 03/29/2023 4:39:06 PM By: [name] RN Entered By: [name] on 03/29/2023 15:51:37 Casali, Hillery Hunter ([id number]) [id number] Page 3 of 7 -------------------------------------------------------------------------------- Encounter Discharge Information Details Patient Name: Date of Service: [name], [name] 03/29/2023 3:15 PM Medical Record Number: [id number] Patient Account Number: [id number] Date  of Birth/Sex: Treating RN: Jun 20, 1971 (52 y.o. [name]) [name] Primary Care Woodie Trusty: Dale [place] Other Clinician: Referring Armen Waring: Treating Maziah Keeling/Extender: Antony Blackbird Weeks in Treatment: 93 Encounter Discharge Information Items Discharge Condition: Stable Ambulatory Status: Wheelchair Discharge Destination: Home Transportation: Private Auto Accompanied By: self Schedule Follow-up Appointment: Yes Clinical Summary of Care: Electronic Signature(s) Signed: 03/29/2023 4:32:39 PM By: [name] RN Entered By: [name] on 03/29/2023 16:32:38 -------------------------------------------------------------------------------- Lower Extremity Assessment Details Patient Name: Date of Service: [name], [name] 03/29/2023 3:15 PM Medical Record Number: [id number] Patient Account Number: [id number] Date of Birth/Sex: Treating RN: 02/25/1971 (52 y.o. [name]) [name] Primary Care Dessiree Sze: Dale [place] Other Clinician: Referring Mohd Clemons: Treating Kyera Felan/Extender: Antony Blackbird Weeks in Treatment: 73 Electronic Signature(s) Signed: 03/29/2023 4:39:06 PM By: [name] RN Entered By: [name] on 03/29/2023 15:34:03 -------------------------------------------------------------------------------- Multi Wound Chart Details Patient Name: Date of Service: [name] Lean EN K. 03/29/2023 3:15 PM Medical Record Number: [id number] Patient Account Number: [id number] Date of Birth/Sex: Treating RN: Sep 26, 1971 (52 y.o. [name]) [name] Primary Care Daleysa Kristiansen: Dale [place] Other Clinician: Referring Dorsie Burich: Treating Maleeyah Mccaughey/Extender: Antony Blackbird Weeks in Treatment: 54 Vital Signs Height(in): 72 Pulse(bpm): 54 Weight(lbs): 175 Blood Pressure(mmHg): 184/90 Body Mass Index(BMI): 23.7 Temperature(F): 97.5 Respiratory Rate(breaths/min): 18 [8:Photos:] [N/A:N/A] MAKS, CAVALLERO ([id number]) [8:Midline Sacrum Wound Location:  Gradually Appeared Wounding Event: Pressure Ulcer Primary Etiology: Congestive Heart Failure, Type I Comorbid History: Diabetes, History of pressure wounds, Paraplegia 05/07/2021 Date Acquired: 4 Weeks of Treatment: Open  Wound Status: No Wound Recurrence: 2.6x2.5x0.4 Measurements L x W x D (cm) 5.105 A (cm) : rea 2.042 Volume (cm) : 7.10% % Reduction in A rea: 7.10% % Reduction in Volume: Category/Stage III Classification: Medium Exudate A mount: Serosanguineous  Exudate Type: red, brown Exudate Color: Thickened Wound Margin: Large (67-100%) Granulation A mount: Red Granulation Quality: None Present (  0%) Necrotic A mount: Fat Layer (Subcutaneous Tissue): Yes N/A Exposed Structures: Fascia: No Tendon: No Muscle:  No Joint: No Bone: No None Epithelialization:] [N/A:N/A N/A N/A N/A N/A N/A N/A N/A N/A N/A N/A N/A N/A N/A N/A N/A N/A N/A N/A N/A N/A N/A] Treatment Notes Electronic Signature(s) Signed: 03/29/2023 4:39:06 PM By: [name] RN Entered By: [name] on 03/29/2023 15:34:10 -------------------------------------------------------------------------------- Multi-Disciplinary Care Plan Details Patient Name: Date of Service: [name] Lean EN K. 03/29/2023 3:15 PM Medical Record Number: [id number] Patient Account Number: [id number] Date of Birth/Sex: Treating RN: 11/09/1971 (52 y.o. [name] Primary Care Mariaeduarda Defranco: Dale [place] Other Clinician: Referring Drago Hammonds: Treating Ethaniel Garfield/Extender: Antony Blackbird Weeks in Treatment: 69 Active Inactive Wound/Skin Impairment Nursing Diagnoses: Knowledge deficit related to ulceration/compromised skin integrity Goals: Patient/caregiver will verbalize understanding of skin care regimen Date Initiated: 11/27/2021 Target Resolution Date: 05/28/2022 Goal Status: Active Ulcer/skin breakdown will have a volume reduction of 30% by week 4 Date Initiated: 11/27/2021 Date Inactivated: 03/29/2023 Target Resolution Date:  01/28/2022 Unmet Reason: comorbidities/non Goal Status: Unmet compliance Ulcer/skin breakdown will have a volume reduction of 50% by week 8 Date Initiated: 11/27/2021 Date Inactivated: 03/29/2023 Target Resolution Date: 02/25/2022 Unmet Reason: comorbidities/non Goal Status: Unmet compliance Ulcer/skin breakdown will have a volume reduction of 80% by week 12 Date Initiated: 11/27/2021 Date Inactivated: 03/29/2023 Target Resolution Date: 03/28/2022 Unmet Reason: comorbidities/non Goal Status: Unmet compliance Ulcer/skin breakdown will heal within 14 weeks Date Initiated: 11/27/2021 Target Resolution Date: 04/27/2022 Goal Status: Active [name], [name] ([id number]) [phone].pdf Page 5 of 7 Interventions: Assess patient/caregiver ability to obtain necessary supplies Assess patient/caregiver ability to perform ulcer/skin care regimen upon admission and as needed Assess ulceration(s) every visit Notes: Electronic Signature(s) Signed: 03/29/2023 4:39:06 PM By: [name] RN Entered By: [name] on 03/29/2023 15:35:05 -------------------------------------------------------------------------------- Pain Assessment Details Patient Name: Date of Service: [name], [name] 03/29/2023 3:15 PM Medical Record Number: [id number] Patient Account Number: [id number] Date of Birth/Sex: Treating RN: [birth year] (52 y.o. [name] Primary Care Gearl Kimbrough: Dale [place] Other Clinician: Referring Pauleen Goleman: Treating Shanele Nissan/Extender: Antony Blackbird Weeks in Treatment: 57 Active Problems Location of Pain Severity and Description of Pain Patient Has Paino No Site Locations Pain Management and Medication Current Pain Management: Electronic Signature(s) Signed: 03/29/2023 4:39:06 PM By: [name] RN Entered By: [name] on 03/29/2023 15:28:27 -------------------------------------------------------------------------------- Patient/Caregiver  Education Details Patient Name: Date of Service: [name] 4/22/2024andnbsp3:15 PM Medical Record Number: [id number] Patient Account Number: [id number] Date of Birth/Gender: Treating RN: 1971-12-04 (52 y.o. [name] Primary Care Physician: Dale [place] Other Clinician: Referring Physician: Treating Physician/Extender: Antony Blackbird Weeks in Treatment: 67 Education Assessment Education Provided To: Patient Education Topics Provided [name], [name] ([id number]) [id number] Page 6 of 7 Wound/Skin Impairment: Handouts: Caring for Your Ulcer Methods: Explain/Verbal Responses: State content correctly Electronic Signature(s) Signed: 03/29/2023 4:39:06 PM By: [name] RN Entered By: [name] on 03/29/2023 15:35:20 -------------------------------------------------------------------------------- Wound Assessment Details Patient Name: Date of Service: [name], [name] 03/29/2023 3:15 PM Medical Record Number: [id number] Patient Account Number: [id number] Date of Birth/Sex: Treating RN: 1971/04/11 (52 y.o. [name] Primary Care Tru Leopard: Dale [place] Other Clinician: Referring Honour Schwieger: Treating Shequila Neglia/Extender: Antony Blackbird Weeks in Treatment: 69 Wound Status Wound Number: 8 Primary Pressure Ulcer Etiology: Wound Location: Midline Sacrum Wound Open Wounding Event: Gradually Appeared Status: Date Acquired: 05/07/2021 Comorbid Congestive Heart Failure, Type I Diabetes, History of pressure Weeks Of Treatment: 69 History: wounds, Paraplegia  Clustered Wound: No Photos Wound Measurements Length: (cm) 2.6 Width: (cm) 2.5 Depth: (cm) 0.4 Area: (cm) 5.105 Volume: (cm) 2.042 % Reduction in Area: 7.1% % Reduction in Volume: 7.1% Epithelialization: None Tunneling: No Undermining: No Wound Description Classification: Category/Stage III Wound Margin: Thickened Exudate Amount: Medium Exudate  Type: Serosanguineous Exudate Color: red, brown Foul Odor After Cleansing: No Slough/Fibrino No Wound Bed Granulation Amount: Large (67-100%) Exposed Structure Granulation Quality: Red Fascia Exposed: No Necrotic Amount: None Present (0%) Fat Layer (Subcutaneous Tissue) Exposed: Yes Tendon Exposed: No Muscle Exposed: No Joint Exposed: No Bone Exposed: No Treatment Notes Wound #8 (Sacrum) Wound Laterality: Midline Cleanser [name], [name] ([id number]) [phone].pdf Page 7 of 7 [facility] Ancillary Kit - 15 Day Supply Discharge Instruction: Use supplies as instructed; Kit contains: (15) Saline Bullets; (15) 3x3 Gauze; 15 pr Gloves Soap and Water Discharge Instruction: Gently cleanse wound with antibacterial soap, rinse and pat dry prior to dressing wounds Peri-Wound Care Topical Primary Dressing Silvercel Small 2x2 (in/in) Discharge Instruction: Apply Silvercel Small 2x2 (in/in) as instructed Secondary Dressing (BORDER) Zetuvit Plus SILICONE BORDER Dressing 4x4 (in/in) Secured With Compression Wrap Compression Stockings Add-Ons Electronic Signature(s) Signed: 03/29/2023 4:39:06 PM By: [name] RN Entered By: [name] on 03/29/2023 15:33:39 -------------------------------------------------------------------------------- Vitals Details Patient Name: Date of Service: [name] Lean EN K. 03/29/2023 3:15 PM Medical Record Number: [id number] Patient Account Number: [id number] Date of Birth/Sex: Treating RN: [birth year] (52 y.o. [name]) [name] Primary Care Harun Brumley: Dale [place] Other Clinician: Referring Chelsi Warr: Treating Jamerion Cabello/Extender: Antony Blackbird Weeks in Treatment: 69 Vital Signs Time Taken: 15:28 Temperature (F): 97.5 Height (in): 72 Pulse (bpm): 54 Weight (lbs): 175 Respiratory Rate (breaths/min): 18 Body Mass Index (BMI): 23.7 Blood Pressure (mmHg): 184/90 Reference Range: 80 - 120 mg / dl Electronic  Signature(s) Signed: 03/29/2023 4:39:06 PM By: [name] RN Entered By: [name] on 03/29/2023 15:28:16

## 2023-04-05 ENCOUNTER — Encounter: Payer: Self-pay | Admitting: Urology

## 2023-04-06 ENCOUNTER — Other Ambulatory Visit: Payer: Self-pay

## 2023-04-06 DIAGNOSIS — N390 Urinary tract infection, site not specified: Secondary | ICD-10-CM

## 2023-04-06 DIAGNOSIS — E109 Type 1 diabetes mellitus without complications: Secondary | ICD-10-CM | POA: Diagnosis not present

## 2023-04-06 DIAGNOSIS — N200 Calculus of kidney: Secondary | ICD-10-CM

## 2023-04-06 DIAGNOSIS — N319 Neuromuscular dysfunction of bladder, unspecified: Secondary | ICD-10-CM

## 2023-04-08 ENCOUNTER — Other Ambulatory Visit: Payer: Self-pay | Admitting: Internal Medicine

## 2023-04-15 DIAGNOSIS — E109 Type 1 diabetes mellitus without complications: Secondary | ICD-10-CM | POA: Diagnosis not present

## 2023-04-15 DIAGNOSIS — Z87440 Personal history of urinary (tract) infections: Secondary | ICD-10-CM | POA: Diagnosis not present

## 2023-04-15 DIAGNOSIS — R339 Retention of urine, unspecified: Secondary | ICD-10-CM | POA: Diagnosis not present

## 2023-04-15 DIAGNOSIS — G8250 Quadriplegia, unspecified: Secondary | ICD-10-CM | POA: Diagnosis not present

## 2023-04-20 DIAGNOSIS — E109 Type 1 diabetes mellitus without complications: Secondary | ICD-10-CM | POA: Diagnosis not present

## 2023-04-20 DIAGNOSIS — L89153 Pressure ulcer of sacral region, stage 3: Secondary | ICD-10-CM | POA: Diagnosis not present

## 2023-04-20 DIAGNOSIS — G8220 Paraplegia, unspecified: Secondary | ICD-10-CM | POA: Diagnosis not present

## 2023-04-20 DIAGNOSIS — Z794 Long term (current) use of insulin: Secondary | ICD-10-CM | POA: Diagnosis not present

## 2023-04-20 DIAGNOSIS — L89154 Pressure ulcer of sacral region, stage 4: Secondary | ICD-10-CM | POA: Diagnosis not present

## 2023-04-21 ENCOUNTER — Telehealth: Payer: Self-pay | Admitting: Internal Medicine

## 2023-04-21 NOTE — Telephone Encounter (Signed)
Aram Beecham from [employer] called stating the some of the pages were not signed concerning pt wheelchair. Aram Beecham will fax the one that was not signed

## 2023-04-22 NOTE — Telephone Encounter (Signed)
Signed and placed on your desk. 

## 2023-04-22 NOTE — Telephone Encounter (Signed)
2 pages of [employer] paperwork that was sent over yesterday were not signed. They have resent packet and I have pulled out the two pages that need a signature and placed them out for you to sign so I can resend to them.

## 2023-04-22 NOTE — Telephone Encounter (Signed)
FAXED TO [employer]

## 2023-04-26 ENCOUNTER — Encounter: Payer: Medicare Other | Attending: Physician Assistant | Admitting: Physician Assistant

## 2023-04-26 DIAGNOSIS — G8254 Quadriplegia, C5-C7 incomplete: Secondary | ICD-10-CM | POA: Diagnosis not present

## 2023-04-26 DIAGNOSIS — Z981 Arthrodesis status: Secondary | ICD-10-CM | POA: Diagnosis not present

## 2023-04-26 DIAGNOSIS — Z794 Long term (current) use of insulin: Secondary | ICD-10-CM | POA: Diagnosis not present

## 2023-04-26 DIAGNOSIS — L89153 Pressure ulcer of sacral region, stage 3: Secondary | ICD-10-CM | POA: Insufficient documentation

## 2023-04-26 DIAGNOSIS — G8929 Other chronic pain: Secondary | ICD-10-CM | POA: Diagnosis not present

## 2023-04-26 DIAGNOSIS — I110 Hypertensive heart disease with heart failure: Secondary | ICD-10-CM | POA: Diagnosis not present

## 2023-04-26 DIAGNOSIS — I5042 Chronic combined systolic (congestive) and diastolic (congestive) heart failure: Secondary | ICD-10-CM | POA: Insufficient documentation

## 2023-04-26 DIAGNOSIS — F32A Depression, unspecified: Secondary | ICD-10-CM | POA: Insufficient documentation

## 2023-04-26 DIAGNOSIS — E109 Type 1 diabetes mellitus without complications: Secondary | ICD-10-CM | POA: Diagnosis not present

## 2023-04-27 DIAGNOSIS — L89153 Pressure ulcer of sacral region, stage 3: Secondary | ICD-10-CM | POA: Diagnosis not present

## 2023-04-28 ENCOUNTER — Other Ambulatory Visit: Payer: Self-pay | Admitting: Internal Medicine

## 2023-04-29 ENCOUNTER — Encounter: Payer: Self-pay | Admitting: Internal Medicine

## 2023-04-29 ENCOUNTER — Other Ambulatory Visit: Payer: Self-pay | Admitting: Radiology

## 2023-04-29 MED ORDER — BACLOFEN 20 MG PO TABS: ORAL_TABLET | ORAL | 5 refills | Status: DC

## 2023-04-29 NOTE — Telephone Encounter (Signed)
Rx sent in baclofen

## 2023-04-30 NOTE — Telephone Encounter (Signed)
Called pt and he reported that someone had called him yesterday 04/29/23.

## 2023-05-24 ENCOUNTER — Ambulatory Visit: Payer: Medicare Other | Admitting: Physician Assistant

## 2023-05-24 ENCOUNTER — Ambulatory Visit (INDEPENDENT_AMBULATORY_CARE_PROVIDER_SITE_OTHER): Payer: Medicare Other

## 2023-05-24 VITALS — Ht 72.0 in | Wt 160.0 lb

## 2023-05-24 DIAGNOSIS — Z0000 Encounter for general adult medical examination without abnormal findings: Secondary | ICD-10-CM | POA: Diagnosis not present
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
## 2023-05-24 NOTE — Patient Instructions (Signed)
[name] , Thank you for taking time to come for your Medicare Wellness Visit. I appreciate your ongoing commitment to your health goals. Please review the following plan we discussed and let me know if I can assist you in the future.   These are the goals we discussed:  Goals       Follow up with PCP      As needed      Patient Stated (pt-stated)      Patient's goal is to get his blood pressure under better control and to get his decubitus ulcer on the sacral area healed up.         This is a list of the screening recommended for you and due dates:  Health Maintenance  Topic Date Due   Zoster (Shingles) Vaccine (1 of 2) Never done   COVID-19 Vaccine (3 - Moderna risk series) 10/14/2020   Complete foot exam   02/20/2023   Yearly kidney health urinalysis for diabetes  03/04/2023   Hemoglobin A1C  03/30/2023   Hepatitis C Screening  12/10/2023*   Flu Shot  07/08/2023   Yearly kidney function blood test for diabetes  01/08/2024   Eye exam for diabetics  04/05/2024   Medicare Annual Wellness Visit  05/23/2024   Colon Cancer Screening  08/27/2032   HIV Screening  Completed   HPV Vaccine  Aged Out   DTaP/Tdap/Td vaccine  Discontinued  *Topic was postponed. The date shown is not the original due date.    Advanced directives: Advance directive discussed with you today. Even though you declined this today, please call our office should you change your mind, and we can give you the proper paperwork for you to fill out. Advance care planning is a way to make decisions about medical care that fits your values in case you are ever unable to make these decisions for yourself.  Information on Advanced Care Planning can be found at [facility] Advance Health Care Directives Advance Health Care Directives ([email or web address]    Conditions/risks identified: Aim for 30 minutes of exercise or brisk walking, 6-8 glasses of water, and 5 servings of fruits and vegetables each  day.   Next appointment: Follow up in one year for your annual wellness visit   May 24, 2024 at 1:30pm telephone visit  Preventive Care 40-64 Years, Male Preventive care refers to lifestyle choices and visits with your health care provider that can promote health and wellness. What does preventive care include? A yearly physical exam. This is also called an annual well check. Dental exams once or twice a year. Routine eye exams. Ask your health care provider how often you should have your eyes checked. Personal lifestyle choices, including: Daily care of your teeth and gums. Regular physical activity. Eating a healthy diet. Avoiding tobacco and drug use. Limiting alcohol use. Practicing safe sex. Taking low-dose aspirin every day starting at age 12. What happens during an annual well check? The services and screenings done by your health care provider during your annual well check will depend on your age, overall health, lifestyle risk factors, and family history of disease. Counseling  Your health care provider may ask you questions about your: Alcohol use. Tobacco use. Drug use. Emotional well-being. Home and relationship well-being. Sexual activity. Eating habits. Work and work Astron[employer]. Screening  You may have the following tests or measurements: Height, weight, and BMI. Blood pressure. Lipid and cholesterol levels. These may be checked every 5 years, or more  frequently if you are over 42 years old. Skin check. Lung cancer screening. You may have this screening every year starting at age 2 if you have a 30-pack-year history of smoking and currently smoke or have quit within the past 15 years. Fecal occult blood test (FOBT) of the stool. You may have this test every year starting at age 24. Flexible sigmoidoscopy or colonoscopy. You may have a sigmoidoscopy every 5 years or a colonoscopy every 10 years starting at age 86. Prostate cancer screening. Recommendations  will vary depending on your family history and other risks. Hepatitis C blood test. Hepatitis B blood test. Sexually transmitted disease (STD) testing. Diabetes screening. This is done by checking your blood sugar (glucose) after you have not eaten for a while (fasting). You may have this done every 1-3 years. Discuss your test results, treatment options, and if necessary, the need for more tests with your health care provider. Vaccines  Your health care provider may recommend certain vaccines, such as: Influenza vaccine. This is recommended every year. Tetanus, diphtheria, and acellular pertussis (Tdap, Td) vaccine. You may need a Td booster every 10 years. Zoster vaccine. You may need this after age 42. Pneumococcal 13-valent conjugate (PCV13) vaccine. You may need this if you have certain conditions and have not been vaccinated. Pneumococcal polysaccharide (PPSV23) vaccine. You may need one or two doses if you smoke cigarettes or if you have certain conditions. Talk to your health care provider about which screenings and vaccines you need and how often you need them. This information is not intended to replace advice given to you by your health care provider. Make sure you discuss any questions you have with your health care provider. Document Released: 12/20/2015 Document Revised: 08/12/2016 Document Reviewed: 09/24/2015 Elsevier Interactive Patient Education  2017 [employer].  Fall Prevention in the Home Falls can cause injuries. They can happen to people of all ages. There are many things you can do to make your home safe and to help prevent falls. What can I do on the outside of my home? Regularly fix the edges of walkways and driveways and fix any cracks. Remove anything that might make you trip as you walk through a door, such as a raised step or threshold. Trim any bushes or trees on the path to your home. Use bright outdoor lighting. Clear any walking paths of anything that  might make someone trip, such as rocks or tools. Regularly check to see if handrails are loose or broken. Make sure that both sides of any steps have handrails. Any raised decks and porches should have guardrails on the edges. Have any leaves, snow, or ice cleared regularly. Use sand or salt on walking paths during winter. Clean up any spills in your garage right away. This includes oil or grease spills. What can I do in the bathroom? Use night lights. Install grab bars by the toilet and in the tub and shower. Do not use towel bars as grab bars. Use non-skid mats or decals in the tub or shower. If you need to sit down in the shower, use a plastic, non-slip stool. Keep the floor dry. Clean up any water that spills on the floor as soon as it happens. Remove soap buildup in the tub or shower regularly. Attach bath mats securely with double-sided non-slip rug tape. Do not have throw rugs and other things on the floor that can make you trip. What can I do in the bedroom? Use night lights. Make sure that  you have a light by your bed that is easy to reach. Do not use any sheets or blankets that are too big for your bed. They should not hang down onto the floor. Have a firm chair that has side arms. You can use this for support while you get dressed. Do not have throw rugs and other things on the floor that can make you trip. What can I do in the kitchen? Clean up any spills right away. Avoid walking on wet floors. Keep items that you use a lot in easy-to-reach places. If you need to reach something above you, use a strong step stool that has a grab bar. Keep electrical cords out of the way. Do not use floor polish or wax that makes floors slippery. If you must use wax, use non-skid floor wax. Do not have throw rugs and other things on the floor that can make you trip. What can I do with my stairs? Do not leave any items on the stairs. Make sure that there are handrails on both sides of the  stairs and use them. Fix handrails that are broken or loose. Make sure that handrails are as long as the stairways. Check any carpeting to make sure that it is firmly attached to the stairs. Fix any carpet that is loose or worn. Avoid having throw rugs at the top or bottom of the stairs. If you do have throw rugs, attach them to the floor with carpet tape. Make sure that you have a light switch at the top of the stairs and the bottom of the stairs. If you do not have them, ask someone to add them for you. What else can I do to help prevent falls? Wear shoes that: Do not have high heels. Have rubber bottoms. Are comfortable and fit you well. Are closed at the toe. Do not wear sandals. If you use a stepladder: Make sure that it is fully opened. Do not climb a closed stepladder. Make sure that both sides of the stepladder are locked into place. Ask someone to hold it for you, if possible. Clearly mark and make sure that you can see: Any grab bars or handrails. First and last steps. Where the edge of each step is. Use tools that help you move around (mobility aids) if they are needed. These include: Canes. Walkers. Scooters. Crutches. Turn on the lights when you go into a dark area. Replace any light bulbs as soon as they burn out. Set up your furniture so you have a clear path. Avoid moving your furniture around. If any of your floors are uneven, fix them. If there are any pets around you, be aware of where they are. Review your medicines with your doctor. Some medicines can make you feel dizzy. This can increase your chance of falling. Ask your doctor what other things that you can do to help prevent falls. This information is not intended to replace advice given to you by your health care provider. Make sure you discuss any questions you have with your health care provider. Document Released: 09/19/2009 Document Revised: 04/30/2016 Document Reviewed: 12/28/2014 Elsevier Interactive  Patient Education  2017 [employer].
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
## 2023-05-25 ENCOUNTER — Encounter: Payer: Medicare Other | Attending: Physician Assistant | Admitting: Physician Assistant

## 2023-05-25 DIAGNOSIS — E1051 Type 1 diabetes mellitus with diabetic peripheral angiopathy without gangrene: Secondary | ICD-10-CM | POA: Insufficient documentation

## 2023-05-25 DIAGNOSIS — G8254 Quadriplegia, C5-C7 incomplete: Secondary | ICD-10-CM | POA: Insufficient documentation

## 2023-05-25 DIAGNOSIS — L89153 Pressure ulcer of sacral region, stage 3: Secondary | ICD-10-CM | POA: Insufficient documentation

## 2023-05-25 DIAGNOSIS — G8929 Other chronic pain: Secondary | ICD-10-CM | POA: Insufficient documentation

## 2023-05-25 DIAGNOSIS — E10621 Type 1 diabetes mellitus with foot ulcer: Secondary | ICD-10-CM | POA: Diagnosis not present

## 2023-05-25 DIAGNOSIS — I110 Hypertensive heart disease with heart failure: Secondary | ICD-10-CM | POA: Diagnosis not present

## 2023-05-25 DIAGNOSIS — I5042 Chronic combined systolic (congestive) and diastolic (congestive) heart failure: Secondary | ICD-10-CM | POA: Diagnosis not present

## 2023-05-27 NOTE — Progress Notes (Signed)
BYRLE, GOBEN ([id number]) [id number] Page 1 of 9 Visit Report for 05/25/2023 Arrival Information Details Patient Name: Date of Service: [name], [name]. 05/25/2023 4:00 PM Medical Record Number: [id number] Patient Account Number: [id number] Date of Birth/Sex: Treating RN: June 06, 1971 (52 y.o. [name]) [name] Primary Care [name]: [name] Other Clinician: Referring [name]: Treating [name]/Extender: [name] Weeks in Treatment: 60 Visit Information History Since Last Visit Added or deleted any medications: No Patient Arrived: Wheel Chair Any new allergies or adverse reactions: No Arrival Time: 16:14 Had a fall or experienced change in No Accompanied By: self activities of daily living that may affect Transfer Assistance: None risk of falls: Patient Identification Verified: Yes Signs or symptoms of abuse/neglect since last visito No Secondary Verification Process Completed: Yes Hospitalized since last visit: No Patient Requires Transmission-Based Precautions: No Implantable device outside of the clinic excluding No Patient Has Alerts: No cellular tissue based products placed in the center since last visit: Has Dressing in Place as Prescribed: Yes Pain Present Now: No Electronic Signature(s) Signed: 05/27/2023 12:58:51 PM By: [name] Pax RN Entered By: [name] on 05/25/2023 16:14:38 -------------------------------------------------------------------------------- Clinic Level of Care Assessment Details Patient Name: Date of Service: [name], [name]. 05/25/2023 4:00 PM Medical Record Number: [id number] Patient Account Number: [id number] Date of Birth/Sex: Treating RN: September 27, 1971 (52 y.o. [name]) [name] Primary Care [name]: [name] Other Clinician: Referring Lima Chillemi: Treating [name]/Extender: [name] Weeks in Treatment: 77 Clinic Level of Care Assessment Items TOOL 4 Quantity  Score X- 1 0 Use when only an EandM is performed on FOLLOW-UP visit ASSESSMENTS - Nursing Assessment / Reassessment X- 1 10 Reassessment of Co-morbidities (includes updates in patient status) X- 1 5 Reassessment of Adherence to Treatment Plan [name] ([id number]) [id number] Page 2 of 9 ASSESSMENTS - Wound and Skin A ssessment / Reassessment X - Simple Wound Assessment / Reassessment - one wound 1 5 []  - 0 Complex Wound Assessment / Reassessment - multiple wounds []  - 0 Dermatologic / Skin Assessment (not related to wound area) ASSESSMENTS - Focused Assessment []  - 0 Circumferential Edema Measurements - multi extremities []  - 0 Nutritional Assessment / Counseling / Intervention []  - 0 Lower Extremity Assessment (monofilament, tuning fork, pulses) []  - 0 Peripheral Arterial Disease Assessment (using hand held doppler) ASSESSMENTS - Ostomy and/or Continence Assessment and Care []  - 0 Incontinence Assessment and Management []  - 0 Ostomy Care Assessment and Management (repouching, etc.) PROCESS - Coordination of Care X - Simple Patient / Family Education for ongoing care 1 15 []  - 0 Complex (extensive) Patient / Family Education for ongoing care []  - 0 Staff obtains Chiropractor, Records, T Results / Process Orders est []  - 0 Staff telephones HHA, Nursing Homes / Clarify orders / etc []  - 0 Routine Transfer to another Facility (non-emergent condition) []  - 0 Routine Hospital Admission (non-emergent condition) []  - 0 New Admissions / Manufacturing engineer / Ordering NPWT Apligraf, etc. , []  - 0 Emergency Hospital Admission (emergent condition) X- 1 10 Simple Discharge Coordination []  - 0 Complex (extensive) Discharge Coordination PROCESS - Special Needs []  - 0 Pediatric / Minor Patient Management []  - 0 Isolation Patient Management []  - 0 Hearing / Language / Visual special needs []  - 0 Assessment of Community assistance  (transportation, D/C planning, etc.) []  - 0 Additional assistance / Altered mentation []  - 0 Support Surface(s) Assessment (bed, cushion, seat, etc.) INTERVENTIONS - Wound Cleansing / Measurement X - Simple Wound Cleansing -  one wound 1 5 []  - 0 Complex Wound Cleansing - multiple wounds X- 1 5 Wound Imaging (photographs - any number of wounds) []  - 0 Wound Tracing (instead of photographs) X- 1 5 Simple Wound Measurement - one wound []  - 0 Complex Wound Measurement - multiple wounds INTERVENTIONS - Wound Dressings X - Small Wound Dressing one or multiple wounds 1 10 []  - 0 Medium Wound Dressing one or multiple wounds []  - 0 Large Wound Dressing one or multiple wounds []  - 0 Application of Medications - topical []  - 0 Application of Medications - injection INTERVENTIONS - Miscellaneous []  - 0 External ear exam [name], [name] ([id number]) [id number] Page 3 of 9 []  - 0 Specimen Collection (cultures, biopsies, blood, body fluids, etc.) []  - 0 Specimen(s) / Culture(s) sent or taken to Lab for analysis []  - 0 Patient Transfer (multiple staff / Michiel Sites Lift / Similar devices) []  - 0 Simple Staple / Suture removal (25 or less) []  - 0 Complex Staple / Suture removal (26 or more) []  - 0 Hypo / Hyperglycemic Management (close monitor of Blood Glucose) []  - 0 Ankle / Brachial Index (ABI) - do not check if billed separately X- 1 5 Vital Signs Has the patient been seen at the hospital within the last three years: Yes Total Score: 75 Level Of Care: New/Established - Level 2 Electronic Signature(s) Signed: 05/27/2023 12:58:51 PM By: [name] Pax RN Entered By: [name] on 05/25/2023 16:37:29 -------------------------------------------------------------------------------- Encounter Discharge Information Details Patient Name: Date of Service: [name] Lean EN K. 05/25/2023 4:00 PM Medical Record Number: [id number] Patient Account Number:  [id number] Date of Birth/Sex: Treating RN: 1971-03-09 (52 y.o. [name] Primary Care [place] Logie: [name] [place] Other Clinician: Referring [name] [name]: Treating Leston Schueller/Extender: [name] Weeks in Treatment: 22 Encounter Discharge Information Items Discharge Condition: Stable Ambulatory Status: Wheelchair Discharge Destination: Home Transportation: Private Auto Accompanied By: self Schedule Follow-up Appointment: Yes Clinical Summary of Care: Electronic Signature(s) Signed: 05/27/2023 12:58:51 PM By: [name] Pax RN Entered By: [name] on 05/25/2023 16:31:01 -------------------------------------------------------------------------------- Lower Extremity Assessment Details Patient Name: Date of Service: [name], [name] EN K. 05/25/2023 4:00 PM Rindfleisch, Hillery Hunter ([id number]) [id number] Page 4 of 9 Medical Record Number: [id number] Patient Account Number: [id number] Date of Birth/Sex: Treating RN: 07-18-1971 (52 y.o. [name]) [name] Primary Care Shamira Toutant: [name] [place] Other Clinician: Referring Tavarious Freel: Treating Tavian Callander/Extender: [name] Weeks in Treatment: 23 Electronic Signature(s) Signed: 05/27/2023 12:58:51 PM By: [name] Pax RN Entered By: [name] on 05/25/2023 16:21:02 -------------------------------------------------------------------------------- Multi Wound Chart Details Patient Name: Date of Service: [name] Lean EN K. 05/25/2023 4:00 PM Medical Record Number: [id number] Patient Account Number: [id number] Date of Birth/Sex: Treating RN: Apr 08, 1971 (52 y.o. [name] Primary Care Shakayla Hickox: [name] [place] Other Clinician: Referring Elyce Zollinger: Treating Ajahnae Rathgeber/Extender: [name] Weeks in Treatment: 88 Vital Signs Height(in): 72 Pulse(bpm): 55 Weight(lbs): 175 Blood Pressure(mmHg): 196/97 Body Mass Index(BMI): 23.7 Temperature(F): 97.6 Respiratory  Rate(breaths/min): 18 [8:Photos:] [N/A:N/A] Midline Sacrum N/A N/A Wound Location: Gradually Appeared N/A N/A Wounding Event: Pressure Ulcer N/A N/A Primary Etiology: Congestive Heart Failure, Type I N/A N/A Comorbid History: Diabetes, History of pressure wounds, Paraplegia 05/07/2021 N/A N/A Date Acquired: 61 N/A N/A Weeks of Treatment: Open N/A N/A Wound Status: No N/A N/A Wound Recurrence: 1.2x2x0.5 N/A N/A Measurements L x W x D (cm) 1.885 N/A N/A A (cm) : rea 0.942 N/A N/A Volume (cm) : 65.70% N/A N/A % Reduction in A rea: 57.20% N/A N/A % Reduction  in Volume: Category/Stage III N/A N/A Classification: Medium N/A N/A Exudate A mount: Serosanguineous N/A N/A Exudate Type: red, brown N/A N/A Exudate Color: Thickened N/A N/A Wound Margin: Large (67-100%) N/A N/A Granulation A mount: Red N/A N/A Granulation Quality: None Present (0%) N/A N/A Necrotic A mount: Fat Layer (Subcutaneous Tissue): Yes N/A N/A Exposed Structures: Fascia: No Tendon: No EWARD, HELOU ([id number]) [id number] Page 5 of 9 Muscle: No Joint: No Bone: No None N/A N/A Epithelialization: Treatment Notes Electronic Signature(s) Signed: 05/27/2023 12:58:51 PM By: [name] Pax RN Entered By: [name] on 05/25/2023 16:21:08 -------------------------------------------------------------------------------- Multi-Disciplinary Care Plan Details Patient Name: Date of Service: [name] Lean EN K. 05/25/2023 4:00 PM Medical Record Number: [id number] Patient Account Number: [id number] Date of Birth/Sex: Treating RN: Jan 14, 1971 (52 y.o. [name] Primary Care Tery Hoeger: [name] [place] Other Clinician: Referring Suesan Mohrmann: Treating Kristof Nadeem/Extender: [name] Weeks in Treatment: 54 Active Inactive Wound/Skin Impairment Nursing Diagnoses: Knowledge deficit related to ulceration/compromised skin integrity Goals: Patient/caregiver will  verbalize understanding of skin care regimen Date Initiated: 11/27/2021 Target Resolution Date: 07/29/2022 Goal Status: Active Ulcer/skin breakdown will have a volume reduction of 30% by week 4 Date Initiated: 11/27/2021 Date Inactivated: 03/29/2023 Target Resolution Date: 01/28/2022 Unmet Reason: comorbidities/non Goal Status: Unmet compliance Ulcer/skin breakdown will have a volume reduction of 50% by week 8 Date Initiated: 11/27/2021 Date Inactivated: 03/29/2023 Target Resolution Date: 02/25/2022 Unmet Reason: comorbidities/non Goal Status: Unmet compliance Ulcer/skin breakdown will have a volume reduction of 80% by week 12 Date Initiated: 11/27/2021 Date Inactivated: 03/29/2023 Target Resolution Date: 03/28/2022 Unmet Reason: comorbidities/non Goal Status: Unmet compliance Ulcer/skin breakdown will heal within 14 weeks Date Initiated: 11/27/2021 Date Inactivated: 05/25/2023 Target Resolution Date: 04/27/2022 Unmet Reason: comorbidities/non Goal Status: Unmet compliance Interventions: Assess patient/caregiver ability to obtain necessary supplies Assess patient/caregiver ability to perform ulcer/skin care regimen upon admission and as needed Assess ulceration(s) every visit Notes: Electronic Signature(s) Signed: 05/27/2023 12:58:51 PM By: [name] Pax RN Entered By: [name] on 05/25/2023 16:21:58 Fedele, Hillery Hunter ([id number]) [id number] Page 6 of 9 -------------------------------------------------------------------------------- Pain Assessment Details Patient Name: Date of Service: [name], ELIA. 05/25/2023 4:00 PM Medical Record Number: [id number] Patient Account Number: [id number] Date of Birth/Sex: Treating RN: 07/26/1971 (52 y.o. [name]) [name] Primary Care Kajsa Butrum: [name] [place] Other Clinician: Referring Chinedu Agustin: Treating Omesha Bowerman/Extender: [name] Weeks in Treatment: 36 Active Problems Location of Pain  Severity and Description of Pain Patient Has Paino No Site Locations Pain Management and Medication Current Pain Management: Electronic Signature(s) Signed: 05/27/2023 12:58:51 PM By: [name] Pax RN Entered By: [name] on 05/25/2023 16:15:04 -------------------------------------------------------------------------------- Patient/Caregiver Education Details Patient Name: Date of Service: [name] 6/18/2024andnbsp4:00 PM Medical Record Number: [id number] Patient Account Number: [id number] Date of Birth/Gender: Treating RN: 1971/03/09 (52 y.o. [name] Primary Care Physician: [name] [place] Other Clinician: Referring Physician: Treating Physician/Extender: [name] Weeks in Treatment: [address] [place] ([id number]) [id number] Page 7 of 9 Education Assessment Education Provided To: Patient Education Topics Provided Wound/Skin Impairment: Handouts: Caring for Your Ulcer Methods: Explain/Verbal Responses: State content correctly Electronic Signature(s) Signed: 05/27/2023 12:58:51 PM By: [name] Pax RN Entered By: [name] on 05/25/2023 16:22:14 -------------------------------------------------------------------------------- Wound Assessment Details Patient Name: Date of Service: [name] Heap K. 05/25/2023 4:00 PM Medical Record Number: [id number] Patient Account Number: [id number] Date of Birth/Sex: Treating RN: [birth year] (52 y.o. [name] Primary Care Nelsie Domino: [name] [place] Other Clinician: Referring Honour Schwieger: Treating Toy Eisemann/Extender: [name] Weeks in Treatment:  77 Wound Status Wound Number: 8 Primary Pressure Ulcer Etiology: Wound Location: Midline Sacrum Wound Open Wounding Event: Gradually Appeared Status: Date Acquired: 05/07/2021 Comorbid Congestive Heart Failure, Type I Diabetes, History of pressure Weeks Of Treatment: 77 History: wounds, Paraplegia Clustered  Wound: No Photos Wound Measurements Length: (cm) 1.2 Width: (cm) 2 Depth: (cm) 0.5 Area: (cm) 1.885 Volume: (cm) 0.942 % Reduction in Area: 65.7% % Reduction in Volume: 57.2% Epithelialization: None Tunneling: No Undermining: No Wound Description Classification: Category/Stage III Wound Margin: Thickened [name], [name] ([id number]) Exudate Amount: Medium Exudate Type: Serosanguineous Exudate Color: red, brown Foul Odor After Cleansing: No Slough/Fibrino No [id number] Page 8 of 9 Wound Bed Granulation Amount: Large (67-100%) Exposed Structure Granulation Quality: Red Fascia Exposed: No Necrotic Amount: None Present (0%) Fat Layer (Subcutaneous Tissue) Exposed: Yes Tendon Exposed: No Muscle Exposed: No Joint Exposed: No Bone Exposed: No Treatment Notes Wound #8 (Sacrum) Wound Laterality: Midline Cleanser [place] Ancillary Kit - 15 Day Supply Discharge Instruction: Use supplies as instructed; Kit contains: (15) Saline Bullets; (15) 3x3 Gauze; 15 pr Gloves Soap and Water Discharge Instruction: Gently cleanse wound with antibacterial soap, rinse and pat dry prior to dressing wounds Peri-Wound Care Topical Primary Dressing Silvercel Small 2x2 (in/in) Discharge Instruction: Apply Silvercel Small 2x2 (in/in) as instructed Secondary Dressing (BORDER) Zetuvit Plus SILICONE BORDER Dressing 4x4 (in/in) Secured With Compression Wrap Compression Stockings Add-Ons Electronic Signature(s) Signed: 05/27/2023 12:58:51 PM By: [name] Pax RN Entered By: [name] on 05/25/2023 16:20:41 -------------------------------------------------------------------------------- Vitals Details Patient Name: Date of Service: [name] Lean EN K. 05/25/2023 4:00 PM Medical Record Number: [id number] Patient Account Number: [id number] Date of Birth/Sex: Treating RN: 1971-02-24 (52 y.o. [name]) [name] Primary Care Grayden Burley: [name] [place] Other Clinician: Referring  Berdine Rasmusson: Treating Zykeria Laguardia/Extender: [name] Weeks in Treatment: 77 Vital Signs Time Taken: 16:16 Temperature (F): 97.6 Height (in): 72 Pulse (bpm): 55 Weight (lbs): 175 Respiratory Rate (breaths/min): 18 Body Mass Index (BMI): 23.7 Blood Pressure (mmHg): 196/97 Reference Range: 80 - 120 mg / dl MYKELTI, MOMINEE ([id number]) [id number] Page 9 of 9 Electronic Signature(s) Signed: 05/27/2023 12:58:51 PM By: [name] Pax RN Entered By: [name] on 05/25/2023 16:14:57

## 2023-05-28 DIAGNOSIS — L89153 Pressure ulcer of sacral region, stage 3: Secondary | ICD-10-CM | POA: Diagnosis not present
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
## 2023-06-17 DIAGNOSIS — E109 Type 1 diabetes mellitus without complications: Secondary | ICD-10-CM | POA: Diagnosis not present

## 2023-06-23 DIAGNOSIS — E109 Type 1 diabetes mellitus without complications: Secondary | ICD-10-CM | POA: Diagnosis not present

## 2023-06-23 DIAGNOSIS — R339 Retention of urine, unspecified: Secondary | ICD-10-CM | POA: Diagnosis not present

## 2023-06-23 DIAGNOSIS — G8250 Quadriplegia, unspecified: Secondary | ICD-10-CM | POA: Diagnosis not present

## 2023-06-23 DIAGNOSIS — Z87440 Personal history of urinary (tract) infections: Secondary | ICD-10-CM | POA: Diagnosis not present

## 2023-06-28 ENCOUNTER — Encounter: Payer: Medicare Other | Attending: Physician Assistant | Admitting: Internal Medicine

## 2023-06-28 DIAGNOSIS — I5042 Chronic combined systolic (congestive) and diastolic (congestive) heart failure: Secondary | ICD-10-CM | POA: Diagnosis not present

## 2023-06-28 DIAGNOSIS — E10622 Type 1 diabetes mellitus with other skin ulcer: Secondary | ICD-10-CM | POA: Insufficient documentation

## 2023-06-28 DIAGNOSIS — I110 Hypertensive heart disease with heart failure: Secondary | ICD-10-CM | POA: Diagnosis not present

## 2023-06-28 DIAGNOSIS — L89153 Pressure ulcer of sacral region, stage 3: Secondary | ICD-10-CM | POA: Insufficient documentation

## 2023-06-29 DIAGNOSIS — G8220 Paraplegia, unspecified: Secondary | ICD-10-CM | POA: Diagnosis not present

## 2023-06-29 DIAGNOSIS — L89153 Pressure ulcer of sacral region, stage 3: Secondary | ICD-10-CM | POA: Diagnosis not present

## 2023-06-29 NOTE — Progress Notes (Signed)
ACELIN, FERDIG ([id number]) [id number] Page 1 of 9 Visit Report for 06/28/2023 Arrival Information Details Patient Name: Date of Service: [name], [name]. 06/28/2023 3:15 PM Medical Record Number: [id number] Patient Account Number: [id number] Date of Birth/Sex: Treating RN: 21-Jun-1971 (52 y.o. [name]) [name] Primary Care Vianka Ertel: Dale [place] Other Clinician: Referring Larraine Argo: Treating Jasmen Emrich/Extender: RO BSO N, MICHA EL Marrianne Mood, Charlene Weeks in Treatment: 82 Visit Information History Since Last Visit Added or deleted any medications: No Patient Arrived: Wheel Chair Any new allergies or adverse reactions: No Arrival Time: 15:22 Had a fall or experienced change in No Accompanied By: self activities of daily living that may affect Transfer Assistance: Transfer Board risk of falls: Patient Identification Verified: Yes Signs or symptoms of abuse/neglect since last visito No Secondary Verification Process Completed: Yes Hospitalized since last visit: No Patient Requires Transmission-Based Precautions: No Implantable device outside of the clinic excluding No Patient Has Alerts: No cellular tissue based products placed in the center since last visit: Has Dressing in Place as Prescribed: Yes Pain Present Now: No Electronic Signature(s) Signed: 06/29/2023 4:16:14 PM By: [name] RN Entered By: [name] on 06/28/2023 15:23:50 -------------------------------------------------------------------------------- Clinic Level of Care Assessment Details Patient Name: Date of Service: TRENNON, TORBECK 06/28/2023 3:15 PM Medical Record Number: [id number] Patient Account Number: [id number] Date of Birth/Sex: Treating RN: December 09, 1970 (52 y.o. [name]) [name] Primary Care Dhalia Zingaro: Dale [place] Other Clinician: Referring Kypton Eltringham: Treating Angellina Ferdinand/Extender: RO BSO N, MICHA EL Marrianne Mood, Charlene Weeks in Treatment: 82 Clinic Level of Care  Assessment Items TOOL 4 Quantity Score X- 1 0 Use when only an EandM is performed on FOLLOW-UP visit ASSESSMENTS - Nursing Assessment / Reassessment X- 1 10 Reassessment of Co-morbidities (includes updates in patient status) X- 1 5 Reassessment of Adherence to Treatment Plan [name], [name] ([id number]) [phone] Page 2 of 9 ASSESSMENTS - Wound and Skin A ssessment / Reassessment X - Simple Wound Assessment / Reassessment - one wound 1 5 []  - 0 Complex Wound Assessment / Reassessment - multiple wounds []  - 0 Dermatologic / Skin Assessment (not related to wound area) ASSESSMENTS - Focused Assessment []  - 0 Circumferential Edema Measurements - multi extremities []  - 0 Nutritional Assessment / Counseling / Intervention []  - 0 Lower Extremity Assessment (monofilament, tuning fork, pulses) []  - 0 Peripheral Arterial Disease Assessment (using hand held doppler) ASSESSMENTS - Ostomy and/or Continence Assessment and Care []  - 0 Incontinence Assessment and Management []  - 0 Ostomy Care Assessment and Management (repouching, etc.) PROCESS - Coordination of Care X - Simple Patient / Family Education for ongoing care 1 15 []  - 0 Complex (extensive) Patient / Family Education for ongoing care []  - 0 Staff obtains Chiropractor, Records, T Results / Process Orders est []  - 0 Staff telephones HHA, Nursing Homes / Clarify orders / etc []  - 0 Routine Transfer to another Facility (non-emergent condition) []  - 0 Routine Hospital Admission (non-emergent condition) []  - 0 New Admissions / Manufacturing engineer / Ordering NPWT Apligraf, etc. , []  - 0 Emergency Hospital Admission (emergent condition) X- 1 10 Simple Discharge Coordination []  - 0 Complex (extensive) Discharge Coordination PROCESS - Special Needs []  - 0 Pediatric / Minor Patient Management []  - 0 Isolation Patient Management []  - 0 Hearing / Language / Visual special needs []  - 0 Assessment  of Community assistance (transportation, D/C planning, etc.) []  - 0 Additional assistance / Altered mentation []  - 0 Support Surface(s) Assessment (bed, cushion, seat, etc.) INTERVENTIONS -  Wound Cleansing / Measurement X - Simple Wound Cleansing - one wound 1 5 []  - 0 Complex Wound Cleansing - multiple wounds X- 1 5 Wound Imaging (photographs - any number of wounds) []  - 0 Wound Tracing (instead of photographs) X- 1 5 Simple Wound Measurement - one wound []  - 0 Complex Wound Measurement - multiple wounds INTERVENTIONS - Wound Dressings X - Small Wound Dressing one or multiple wounds 1 10 []  - 0 Medium Wound Dressing one or multiple wounds []  - 0 Large Wound Dressing one or multiple wounds []  - 0 Application of Medications - topical []  - 0 Application of Medications - injection INTERVENTIONS - Miscellaneous []  - 0 External ear exam [name], [name] ([id number]) [id number] Page 3 of 9 []  - 0 Specimen Collection (cultures, biopsies, blood, body fluids, etc.) []  - 0 Specimen(s) / Culture(s) sent or taken to Lab for analysis []  - 0 Patient Transfer (multiple staff / Michiel Sites Lift / Similar devices) []  - 0 Simple Staple / Suture removal (25 or less) []  - 0 Complex Staple / Suture removal (26 or more) []  - 0 Hypo / Hyperglycemic Management (close monitor of Blood Glucose) []  - 0 Ankle / Brachial Index (ABI) - do not check if billed separately X- 1 5 Vital Signs Has the patient been seen at the hospital within the last three years: Yes Total Score: 75 Level Of Care: New/Established - Level 2 Electronic Signature(s) Signed: 06/29/2023 4:16:14 PM By: [name] RN Entered By: [name] on 06/28/2023 15:35:01 -------------------------------------------------------------------------------- Encounter Discharge Information Details Patient Name: Date of Service: [name] Lean EN K. 06/28/2023 3:15 PM Medical Record Number: [id number] Patient Account  Number: [id number] Date of Birth/Sex: Treating RN: 1971-09-08 (52 y.o. [name] Primary Care Sahas Sluka: Dale [place] Other Clinician: Referring Shaqueena Mauceri: Treating Yentl Verge/Extender: RO BSO N, MICHA EL Marrianne Mood, Charlene Weeks in Treatment: 41 Encounter Discharge Information Items Discharge Condition: Stable Ambulatory Status: Wheelchair Discharge Destination: Home Transportation: Private Auto Accompanied By: self Schedule Follow-up Appointment: Yes Clinical Summary of Care: Electronic Signature(s) Signed: 06/29/2023 4:16:14 PM By: [name] RN Entered By: [name] on 06/28/2023 15:35:48 -------------------------------------------------------------------------------- Lower Extremity Assessment Details Patient Name: Date of Service: [name], [name] EN K. 06/28/2023 3:15 PM Chason, [name] ([id number]) [id number] Page 4 of 9 Medical Record Number: [id number] Patient Account Number: [id number] Date of Birth/Sex: Treating RN: 05/16/1971 (52 y.o. [name]) [name] Primary Care Loc Feinstein: Dale [place] Other Clinician: Referring Lannis Lichtenwalner: Treating Lucindy Borel/Extender: RO BSO N, MICHA EL Marrianne Mood, Charlene Weeks in Treatment: 82 Electronic Signature(s) Signed: 06/29/2023 4:16:14 PM By: [name] RN Entered By: [name] on 06/28/2023 15:29:17 -------------------------------------------------------------------------------- Multi Wound Chart Details Patient Name: Date of Service: [name] Lean EN K. 06/28/2023 3:15 PM Medical Record Number: [id number] Patient Account Number: [id number] Date of Birth/Sex: Treating RN: May 09, 1971 (52 y.o. [name]) [name] Primary Care Rodrigus Kilker: Dale [place] Other Clinician: Referring Danijela Vessey: Treating Deah Ottaway/Extender: RO BSO N, MICHA EL Marrianne Mood, Charlene Weeks in Treatment: 82 Vital Signs Height(in): 72 Pulse(bpm): 54 Weight(lbs): 175 Blood Pressure(mmHg): 181/117 Body Mass Index(BMI): 23.7 Temperature(F):  97.5 Respiratory Rate(breaths/min): 18 [8:Photos:] [N/A:N/A] Midline Sacrum N/A N/A Wound Location: Gradually Appeared N/A N/A Wounding Event: Pressure Ulcer N/A N/A Primary Etiology: Congestive Heart Failure, Type I N/A N/A Comorbid History: Diabetes, History of pressure wounds, Paraplegia 05/07/2021 N/A N/A Date Acquired: 61 N/A N/A Weeks of Treatment: Open N/A N/A Wound Status: No N/A N/A Wound Recurrence: 1.2x2x0.4 N/A N/A Measurements L x W x D (cm) 1.885 N/A N/A A (  cm) : rea 0.754 N/A N/A Volume (cm) : 65.70% N/A N/A % Reduction in A rea: 65.70% N/A N/A % Reduction in Volume: Category/Stage III N/A N/A Classification: Medium N/A N/A Exudate A mount: Serosanguineous N/A N/A Exudate Type: red, brown N/A N/A Exudate Color: Thickened N/A N/A Wound Margin: Large (67-100%) N/A N/A Granulation A mount: Red N/A N/A Granulation Quality: None Present (0%) N/A N/A Necrotic A mount: Fat Layer (Subcutaneous Tissue): Yes N/A N/A Exposed Structures: Fascia: No Tendon: No [name], [name] ([id number]) [id number] Page 5 of 9 Muscle: No Joint: No Bone: No None N/A N/A Epithelialization: Treatment Notes Electronic Signature(s) Signed: 06/29/2023 4:16:14 PM By: [name] RN Entered By: [name] on 06/28/2023 15:29:23 -------------------------------------------------------------------------------- Multi-Disciplinary Care Plan Details Patient Name: Date of Service: [name] Lean EN K. 06/28/2023 3:15 PM Medical Record Number: [id number] Patient Account Number: [id number] Date of Birth/Sex: Treating RN: [birth year] (52 y.o. [name]) [name] Primary Care Floris Neuhaus: Dale [place] Other Clinician: Referring Anasofia Micallef: Treating Laquita Harlan/Extender: RO BSO N, MICHA EL Marrianne Mood, Charlene Weeks in Treatment: 82 Active Inactive Wound/Skin Impairment Nursing Diagnoses: Knowledge deficit related to ulceration/compromised skin  integrity Goals: Patient/caregiver will verbalize understanding of skin care regimen Date Initiated: 11/27/2021 Target Resolution Date: 07/29/2022 Goal Status: Active Ulcer/skin breakdown will have a volume reduction of 30% by week 4 Date Initiated: 11/27/2021 Date Inactivated: 03/29/2023 Target Resolution Date: 01/28/2022 Unmet Reason: comorbidities/non Goal Status: Unmet compliance Ulcer/skin breakdown will have a volume reduction of 50% by week 8 Date Initiated: 11/27/2021 Date Inactivated: 03/29/2023 Target Resolution Date: 02/25/2022 Unmet Reason: comorbidities/non Goal Status: Unmet compliance Ulcer/skin breakdown will have a volume reduction of 80% by week 12 Date Initiated: 11/27/2021 Date Inactivated: 03/29/2023 Target Resolution Date: 03/28/2022 Unmet Reason: comorbidities/non Goal Status: Unmet compliance Ulcer/skin breakdown will heal within 14 weeks Date Initiated: 11/27/2021 Date Inactivated: 05/25/2023 Target Resolution Date: 04/27/2022 Unmet Reason: comorbidities/non Goal Status: Unmet compliance Interventions: Assess patient/caregiver ability to obtain necessary supplies Assess patient/caregiver ability to perform ulcer/skin care regimen upon admission and as needed Assess ulceration(s) every visit Notes: Electronic Signature(s) Signed: 06/29/2023 4:16:14 PM By: [name] RN Entered By: [name] on 06/28/2023 15:29:38 [name], [name] ([id number]) [id number] Page 6 of 9 -------------------------------------------------------------------------------- Pain Assessment Details Patient Name: Date of Service: [name], [name] 06/28/2023 3:15 PM Medical Record Number: [id number] Patient Account Number: [id number] Date of Birth/Sex: Treating RN: 09-Nov-1971 (52 y.o. [name]) [name] Primary Care [place]er [place]: Dale [place] Other Clinician: Referring Almena Hokenson: Treating Eliaz Fout/Extender: RO BSO N, MICHA EL Marrianne Mood, Charlene Weeks in  Treatment: 39 Active Problems Location of Pain Severity and Description of Pain Patient Has Paino No Site Locations Pain Management and Medication Current Pain Management: Electronic Signature(s) Signed: 06/29/2023 4:16:14 PM By: [name] RN Entered By: [name] on 06/28/2023 15:24:15 -------------------------------------------------------------------------------- Patient/Caregiver Education Details Patient Name: Date of Service: [name] 7/22/2024andnbsp3:15 PM Medical Record Number: [id number] Patient Account Number: [id number] Date of Birth/Gender: Treating RN: 03/05/1971 (52 y.o. [name]) [name] Primary Care Physician: Dale [place] Other Clinician: Referring Physician: Treating Physician/Extender: Chauncey Mann, MICHA EL Marrianne Mood, Charlene Weeks in Treatment: [address] [place] ([id number]) [id number] Page 7 of 9 Education Assessment Education Provided To: Patient Education Topics Provided Wound/Skin Impairment: Handouts: Caring for Your Ulcer Methods: Explain/Verbal Responses: State content correctly Electronic Signature(s) Signed: 06/29/2023 4:16:14 PM By: [name] RN Entered By: [name] on 06/28/2023 15:29:55 -------------------------------------------------------------------------------- Wound Assessment Details Patient Name: Date of Service: [name] Lean EN K. 06/28/2023 3:15 PM Medical Record Number:  [id number] Patient Account Number: [id number] Date of Birth/Sex: Treating RN: 1971-12-06 (52 y.o. [name]) [name] Primary Care Joseangel Nettleton: Dale [place] Other Clinician: Referring Fleming Prill: Treating Lillien Petronio/Extender: RO BSO N, MICHA EL Marrianne Mood, Charlene Weeks in Treatment: 82 Wound Status Wound Number: 8 Primary Pressure Ulcer Etiology: Wound Location: Midline Sacrum Wound Open Wounding Event: Gradually Appeared Status: Date Acquired: 05/07/2021 Comorbid Congestive Heart Failure, Type I Diabetes, History of  pressure Weeks Of Treatment: 82 History: wounds, Paraplegia Clustered Wound: No Photos Wound Measurements Length: (cm) 1.2 Width: (cm) 2 Depth: (cm) 0.4 Area: (cm) 1.885 Volume: (cm) 0.754 % Reduction in Area: 65.7% % Reduction in Volume: 65.7% Epithelialization: None Tunneling: No Undermining: No Wound Description Classification: Category/Stage III Wound Margin: Thickened [name], [name] ([id number]) Exudate Amount: Medium Exudate Type: Serosanguineous Exudate Color: red, brown Foul Odor After Cleansing: No Slough/Fibrino No [phone].pdf Page 8 of 9 Wound Bed Granulation Amount: Large (67-100%) Exposed Structure Granulation Quality: Red Fascia Exposed: No Necrotic Amount: None Present (0%) Fat Layer (Subcutaneous Tissue) Exposed: Yes Tendon Exposed: No Muscle Exposed: No Joint Exposed: No Bone Exposed: No Treatment Notes Wound #8 (Sacrum) Wound Laterality: Midline Cleanser [place] Ancillary Kit - 15 Day Supply Discharge Instruction: Use supplies as instructed; Kit contains: (15) Saline Bullets; (15) 3x3 Gauze; 15 pr Gloves Soap and Water Discharge Instruction: Gently cleanse wound with antibacterial soap, rinse and pat dry prior to dressing wounds Peri-Wound Care Topical Primary Dressing Silvercel Small 2x2 (in/in) Discharge Instruction: Apply Silvercel Small 2x2 (in/in) as instructed Secondary Dressing (BORDER) Zetuvit Plus SILICONE BORDER Dressing 4x4 (in/in) Secured With Compression Wrap Compression Stockings Add-Ons Electronic Signature(s) Signed: 06/29/2023 4:16:14 PM By: [name] RN Entered By: [name] on 06/28/2023 15:29:02 -------------------------------------------------------------------------------- Vitals Details Patient Name: Date of Service: [name] Lean EN K. 06/28/2023 3:15 PM Medical Record Number: [id number] Patient Account Number: [id number] Date of Birth/Sex: Treating RN: [birth year] (52 y.o. [name]) [name] Primary Care Kerianne Gurr: Dale [place] Other Clinician: Referring Demetrios [name]: Treating Francella Barnett/Extender: RO BSO N, MICHA EL Marrianne Mood, Charlene Weeks in Treatment: 82 Vital Signs Time Taken: 15:23 Temperature (F): 97.5 Height (in): 72 Pulse (bpm): 54 Weight (lbs): 175 Respiratory Rate (breaths/min): 18 Body Mass Index (BMI): 23.7 Blood Pressure (mmHg): 181/117 Reference Range: 80 - 120 mg / dl [name], [name] ([id number]) [id number] Page 9 of 9 Electronic Signature(s) Signed: 06/29/2023 4:16:14 PM By: [name] RN Entered By: [name] on 06/28/2023 15:24:08
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
## 2023-07-13 ENCOUNTER — Other Ambulatory Visit: Payer: Self-pay | Admitting: Internal Medicine

## 2023-07-13 ENCOUNTER — Other Ambulatory Visit: Payer: Self-pay | Admitting: Cardiovascular Disease

## 2023-07-14 ENCOUNTER — Telehealth: Payer: Self-pay | Admitting: Cardiovascular Disease

## 2023-07-14 NOTE — Telephone Encounter (Signed)
Please contact pt for future appointment. Pt due for f/u.  

## 2023-07-14 NOTE — Telephone Encounter (Signed)
Left voice mail to schedule appt

## 2023-07-14 NOTE — Telephone Encounter (Signed)
Left voicemail to schedule appt from recall.

## 2023-07-19 ENCOUNTER — Telehealth: Payer: Self-pay | Admitting: Cardiovascular Disease

## 2023-07-19 NOTE — Telephone Encounter (Signed)
Left voice mail to schedule appt

## 2023-07-22 NOTE — Telephone Encounter (Signed)
Pt is scheduled on 8/23.

## 2023-07-26 ENCOUNTER — Encounter: Payer: Medicare HMO | Attending: Physician Assistant | Admitting: Physician Assistant

## 2023-07-26 DIAGNOSIS — G8254 Quadriplegia, C5-C7 incomplete: Secondary | ICD-10-CM | POA: Insufficient documentation

## 2023-07-26 DIAGNOSIS — E10621 Type 1 diabetes mellitus with foot ulcer: Secondary | ICD-10-CM | POA: Insufficient documentation

## 2023-07-26 DIAGNOSIS — I5042 Chronic combined systolic (congestive) and diastolic (congestive) heart failure: Secondary | ICD-10-CM | POA: Diagnosis not present

## 2023-07-26 DIAGNOSIS — L89153 Pressure ulcer of sacral region, stage 3: Secondary | ICD-10-CM | POA: Insufficient documentation

## 2023-07-26 DIAGNOSIS — I110 Hypertensive heart disease with heart failure: Secondary | ICD-10-CM | POA: Insufficient documentation

## 2023-07-26 DIAGNOSIS — G8929 Other chronic pain: Secondary | ICD-10-CM | POA: Insufficient documentation
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
## 2023-07-30 ENCOUNTER — Ambulatory Visit: Payer: Medicare HMO | Admitting: Cardiology

## 2023-08-06 NOTE — Progress Notes (Signed)
[name], [name] ([id number]) [id number] Page 1 of 8 Visit Report for 07/26/2023 Arrival Information Details Patient Name: Date of Service: [name] [name], EHRLER. 07/26/2023 3:15 PM Medical Record Number: [id number] Patient Account Number: [id number] Date of Birth/Sex: Treating RN: 06-28-1971 (52 y.o. [name] [name]) Jettie Pagan, Lyla Son Primary Care Ettore Trebilcock: Dale [place] Other Clinician: Referring Chandria Rookstool: Treating Khrista Braun/Extender: Antony Blackbird Weeks in Treatment: 3 Visit Information History Since Last Visit Added or deleted any medications: No Patient Arrived: Wheel Chair Any new allergies or adverse reactions: No Arrival Time: 15:31 Had a fall or experienced change in No Accompanied By: self activities of daily living that may affect Transfer Assistance: None risk of falls: Patient Identification Verified: Yes Signs or symptoms of abuse/neglect since last visito No Secondary Verification Process Completed: Yes Hospitalized since last visit: No Patient Requires Transmission-Based Precautions: No Implantable device outside of the clinic excluding No Patient Has Alerts: No cellular tissue based products placed in the center since last visit: Has Dressing in Place as Prescribed: Yes Has Compression in Place as Prescribed: Yes Pain Present Now: No Electronic Signature(s) Signed: 08/06/2023 11:59:03 AM By: [name] RN Entered By: [name] on 07/26/2023 12:31:49 -------------------------------------------------------------------------------- Clinic Level of Care Assessment Details Patient Name: Date of Service: [name] [name] 07/26/2023 3:15 PM Medical Record Number: [id number] Patient Account Number: [id number] Date of Birth/Sex: Treating RN: 1971-02-17 (52 y.o. [name] [name]) [name] Primary Care Ayden Hardwick: Dale [place] Other Clinician: Referring Kody Brandl: Treating Sheccid Lahmann/Extender: Antony Blackbird Weeks in Treatment: 73 Clinic Level  of Care Assessment Items TOOL 4 Quantity Score X- 1 0 Use when only an EandM is performed on FOLLOW-UP visit ASSESSMENTS - Nursing Assessment / Reassessment X- 1 10 Reassessment of Co-morbidities (includes updates in patient status) X- 1 5 Reassessment of Adherence to Treatment Plan MANU, LANDAVAZO ([id number]) [id number] Page 2 of 8 ASSESSMENTS - Wound and Skin A ssessment / Reassessment X - Simple Wound Assessment / Reassessment - one wound 1 5 []  - 0 Complex Wound Assessment / Reassessment - multiple wounds []  - 0 Dermatologic / Skin Assessment (not related to wound area) ASSESSMENTS - Focused Assessment []  - 0 Circumferential Edema Measurements - multi extremities []  - 0 Nutritional Assessment / Counseling / Intervention []  - 0 Lower Extremity Assessment (monofilament, tuning fork, pulses) []  - 0 Peripheral Arterial Disease Assessment (using hand held doppler) ASSESSMENTS - Ostomy and/or Continence Assessment and Care []  - 0 Incontinence Assessment and Management []  - 0 Ostomy Care Assessment and Management (repouching, etc.) PROCESS - Coordination of Care X - Simple Patient / Family Education for ongoing care 1 15 []  - 0 Complex (extensive) Patient / Family Education for ongoing care []  - 0 Staff obtains Chiropractor, Records, T Results / Process Orders est []  - 0 Staff telephones HHA, Nursing Homes / Clarify orders / etc []  - 0 Routine Transfer to another Facility (non-emergent condition) []  - 0 Routine Hospital Admission (non-emergent condition) []  - 0 New Admissions / Manufacturing engineer / Ordering NPWT Apligraf, etc. , []  - 0 Emergency Hospital Admission (emergent condition) X- 1 10 Simple Discharge Coordination []  - 0 Complex (extensive) Discharge Coordination PROCESS - Special Needs []  - 0 Pediatric / Minor Patient Management []  - 0 Isolation Patient Management []  - 0 Hearing / Language / Visual special needs []  -  0 Assessment of Community assistance (transportation, D/C planning, etc.) []  - 0 Additional assistance / Altered mentation []  - 0 Support Surface(s) Assessment (bed, cushion, seat, etc.) INTERVENTIONS - Wound Cleansing /  Measurement X - Simple Wound Cleansing - one wound 1 5 []  - 0 Complex Wound Cleansing - multiple wounds X- 1 5 Wound Imaging (photographs - any number of wounds) []  - 0 Wound Tracing (instead of photographs) X- 1 5 Simple Wound Measurement - one wound []  - 0 Complex Wound Measurement - multiple wounds INTERVENTIONS - Wound Dressings X - Small Wound Dressing one or multiple wounds 1 10 []  - 0 Medium Wound Dressing one or multiple wounds []  - 0 Large Wound Dressing one or multiple wounds []  - 0 Application of Medications - topical []  - 0 Application of Medications - injection INTERVENTIONS - Miscellaneous []  - 0 External ear exam DIMITRIOS, SACHTLEBEN ([id number]) [id number] Page 3 of 8 []  - 0 Specimen Collection (cultures, biopsies, blood, body fluids, etc.) []  - 0 Specimen(s) / Culture(s) sent or taken to Lab for analysis []  - 0 Patient Transfer (multiple staff / Michiel Sites Lift / Similar devices) []  - 0 Simple Staple / Suture removal (25 or less) []  - 0 Complex Staple / Suture removal (26 or more) []  - 0 Hypo / Hyperglycemic Management (close monitor of Blood Glucose) []  - 0 Ankle / Brachial Index (ABI) - do not check if billed separately X- 1 5 Vital Signs Has the patient been seen at the hospital within the last three years: Yes Total Score: 75 Level Of Care: New/Established - Level 2 Electronic Signature(s) Signed: 08/06/2023 11:59:03 AM By: [name] RN Entered By: [name] on 07/26/2023 12:57:46 -------------------------------------------------------------------------------- Encounter Discharge Information Details Patient Name: Date of Service: [name] Lean EN K. 07/26/2023 3:15 PM Medical Record Number:  [id number] Patient Account Number: [id number] Date of Birth/Sex: Treating RN: 03/08/1971 (52 y.o. [name] [name] Primary Care Rayford Williamsen: Dale [place] Other Clinician: Referring Sarthak Rubenstein: Treating Reilly Molchan/Extender: Antony Blackbird Weeks in Treatment: 56 Encounter Discharge Information Items Discharge Condition: Stable Ambulatory Status: Wheelchair Discharge Destination: Home Transportation: Private Auto Accompanied By: self Schedule Follow-up Appointment: Yes Clinical Summary of Care: Electronic Signature(s) Signed: 07/26/2023 3:58:28 PM By: [name] RN Entered By: [name] on 07/26/2023 12:58:28 Lower Extremity Assessment Details -------------------------------------------------------------------------------- Dian Situ ([id number]) [id number] Page 4 of 8 Patient Name: Date of Service: [name] [name] 07/26/2023 3:15 PM Medical Record Number: [id number] Patient Account Number: [id number] Date of Birth/Sex: Treating RN: [birth year] (52 y.o. [name] [name]) [name] Primary Care Axyl Sitzman: Dale [place] Other Clinician: Referring Adiya Selmer: Treating Rosalva Neary/Extender: Antony Blackbird Weeks in Treatment: 65 Electronic Signature(s) Signed: 08/06/2023 11:59:03 AM By: [name] RN Entered By: [name] on 07/26/2023 12:33:30 -------------------------------------------------------------------------------- Multi Wound Chart Details Patient Name: Date of Service: [name] Lean EN K. 07/26/2023 3:15 PM Medical Record Number: [id number] Patient Account Number: [id number] Date of Birth/Sex: Treating RN: 16-Feb-1971 (52 y.o. [name] [name] Primary Care Sahian Kerney: Dale [place] Other Clinician: Referring Jordan Pardini: Treating Heinz Eckert/Extender: Antony Blackbird Weeks in Treatment: 30 Vital Signs Height(in): 72 Pulse(bpm): 51 Weight(lbs): 175 Blood Pressure(mmHg): 108/80 Body Mass Index(BMI): 23.7 Temperature(F):  97.6 Respiratory Rate(breaths/min): 16 [Treatment Notes:Wound Assessments Treatment Notes] Electronic Signature(s) Signed: 08/06/2023 11:59:03 AM By: [name] RN Entered By: [name] on 07/26/2023 12:33:34 -------------------------------------------------------------------------------- Multi-Disciplinary Care Plan Details Patient Name: Date of Service: [name] Lean EN K. 07/26/2023 3:15 PM Medical Record Number: [id number] Patient Account Number: [id number] Date of Birth/Sex: Treating RN: Jun 19, 1971 (52 y.o. [name] [name] Primary Care Bobie Kistler: Dale [place] Other Clinician: Referring Ruel Dimmick: Treating Emaree Chiu/Extender: Antony Blackbird Weeks in Treatment: [address] [name] [name] ([id number]) [id number] Page  5 of 8 Wound/Skin Impairment Nursing Diagnoses: Knowledge deficit related to ulceration/compromised skin integrity Goals: Patient/caregiver will verbalize understanding of skin care regimen Date Initiated: 11/27/2021 Target Resolution Date: 07/29/2022 Goal Status: Active Ulcer/skin breakdown will have a volume reduction of 30% by week 4 Date Initiated: 11/27/2021 Date Inactivated: 03/29/2023 Target Resolution Date: 01/28/2022 Unmet Reason: comorbidities/non Goal Status: Unmet compliance Ulcer/skin breakdown will have a volume reduction of 50% by week 8 Date Initiated: 11/27/2021 Date Inactivated: 03/29/2023 Target Resolution Date: 02/25/2022 Unmet Reason: comorbidities/non Goal Status: Unmet compliance Ulcer/skin breakdown will have a volume reduction of 80% by week 12 Date Initiated: 11/27/2021 Date Inactivated: 03/29/2023 Target Resolution Date: 03/28/2022 Unmet Reason: comorbidities/non Goal Status: Unmet compliance Ulcer/skin breakdown will heal within 14 weeks Date Initiated: 11/27/2021 Date Inactivated: 05/25/2023 Target Resolution Date: 04/27/2022 Unmet Reason: comorbidities/non Goal Status:  Unmet compliance Interventions: Assess patient/caregiver ability to obtain necessary supplies Assess patient/caregiver ability to perform ulcer/skin care regimen upon admission and as needed Assess ulceration(s) every visit Notes: Electronic Signature(s) Signed: 08/06/2023 11:59:03 AM By: [name] RN Entered By: [name] on 07/26/2023 12:33:53 -------------------------------------------------------------------------------- Pain Assessment Details Patient Name: Date of Service: [name] [name] 07/26/2023 3:15 PM Medical Record Number: [id number] Patient Account Number: [id number] Date of Birth/Sex: Treating RN: Nov 18, 1971 (52 y.o. [name] [name] Primary Care Brevyn Ring: Dale [place] Other Clinician: Referring Halla Chopp: Treating Legend Pecore/Extender: Antony Blackbird Weeks in Treatment: 12 Active Problems Location of Pain Severity and Description of Pain Patient Has Paino No Site Locations Limuel, [place] K ([id number]) [id number] Page 6 of 8 Pain Management and Medication Current Pain Management: Electronic Signature(s) Signed: 08/06/2023 11:59:03 AM By: [name] RN Entered By: [name] on 07/26/2023 12:32:25 -------------------------------------------------------------------------------- Patient/Caregiver Education Details Patient Name: Date of Service: Donnetta Hail 8/19/2024andnbsp3:15 PM Medical Record Number: [id number] Patient Account Number: [id number] Date of Birth/Gender: Treating RN: 1971-04-04 (52 y.o. [name] [name] Primary Care Physician: Dale [place] Other Clinician: Referring Physician: Treating Physician/Extender: Antony Blackbird Weeks in Treatment: 73 Education Assessment Education Provided To: Patient Education Topics Provided Wound/Skin Impairment: Handouts: Caring for Your Ulcer Methods: Explain/Verbal Responses: State content correctly Electronic Signature(s) Signed: 08/06/2023  11:59:03 AM By: [name] RN Entered By: [name] on 07/26/2023 12:34:11 [name] [name] [name] ([id number]) [id number] Page 7 of 8 -------------------------------------------------------------------------------- Wound Assessment Details Patient Name: Date of Service: [name] [name]. 07/26/2023 3:15 PM Medical Record Number: [id number] Patient Account Number: [id number] Date of Birth/Sex: Treating RN: February 02, 1971 (52 y.o. [name] [name]) [name] Primary Care Carmine Carrozza: Dale [place] Other Clinician: Referring Yasha Tibbett: Treating Vici Novick/Extender: Antony Blackbird Weeks in Treatment: 86 Wound Status Wound Number: 8 Primary Pressure Ulcer Etiology: Wound Location: Midline Sacrum Wound Open Wounding Event: Gradually Appeared Status: Date Acquired: 05/07/2021 Comorbid Congestive Heart Failure, Type I Diabetes, History of pressure Weeks Of Treatment: 86 History: wounds, Paraplegia Clustered Wound: No Photos Wound Measurements Length: (cm) 4 Width: (cm) 5 Depth: (cm) 0.5 Area: (cm) 15.708 Volume: (cm) 7.854 % Reduction in Area: -185.7% % Reduction in Volume: -257.2% Epithelialization: None Tunneling: No Undermining: No Wound Description Classification: Category/Stage III Wound Margin: Thickened Exudate Amount: Medium Exudate Type: Serosanguineous Exudate Color: red, brown Foul Odor After Cleansing: No Slough/Fibrino No Wound Bed Granulation Amount: Large (67-100%) Exposed Structure Granulation Quality: Red Fascia Exposed: No Necrotic Amount: None Present (0%) Fat Layer (Subcutaneous Tissue) Exposed: Yes Tendon Exposed: No Muscle Exposed: No Joint Exposed: No Bone Exposed: No Treatment Notes Wound #8 (Sacrum) Wound Laterality: Midline Cleanser [place] Ancillary Kit - 15  Day Supply Discharge Instruction: Use supplies as instructed; Kit contains: (15) Saline Bullets; (15) 3x3 Gauze; 15 pr Gloves Cheever, Salam K ([id number])  [id number] Page 8 of 8 Soap and Water Discharge Instruction: Gently cleanse wound with antibacterial soap, rinse and pat dry prior to dressing wounds Peri-Wound Care Topical Primary Dressing Silvercel Small 2x2 (in/in) Discharge Instruction: Apply Silvercel Small 2x2 (in/in) as instructed Secondary Dressing (BORDER) Zetuvit Plus SILICONE BORDER Dressing 4x4 (in/in) Secured With Compression Wrap Compression Stockings Add-Ons Electronic Signature(s) Signed: 08/06/2023 11:59:03 AM By: [name] RN Entered By: [name] on 07/26/2023 12:39:58 -------------------------------------------------------------------------------- Vitals Details Patient Name: Date of Service: [name] Lean EN K. 07/26/2023 3:15 PM Medical Record Number: [id number] Patient Account Number: [id number] Date of Birth/Sex: Treating RN: 05/04/1971 (52 y.o. [name] [name]) [name] Primary Care Raidon Swanner: Dale [place] Other Clinician: Referring Mylon Mabey: Treating Daliana Leverett/Extender: Antony Blackbird Weeks in Treatment: 86 Vital Signs Time Taken: 15:31 Temperature (F): 97.6 Height (in): 72 Pulse (bpm): 51 Weight (lbs): 175 Respiratory Rate (breaths/min): 16 Body Mass Index (BMI): 23.7 Blood Pressure (mmHg): 108/80 Reference Range: 80 - 120 mg / dl Electronic Signature(s) Signed: 08/06/2023 11:59:03 AM By: [name] RN Entered By: [name] on 07/26/2023 12:32:16

## 2023-08-19 ENCOUNTER — Encounter: Payer: Self-pay | Admitting: Cardiology

## 2023-08-19 ENCOUNTER — Ambulatory Visit: Payer: Medicare HMO | Attending: Cardiology | Admitting: Cardiology

## 2023-08-19 VITALS — BP 124/78 | HR 53 | Ht 72.0 in | Wt 160.0 lb

## 2023-08-19 DIAGNOSIS — Z794 Long term (current) use of insulin: Secondary | ICD-10-CM

## 2023-08-19 DIAGNOSIS — G903 Multi-system degeneration of the autonomic nervous system: Secondary | ICD-10-CM

## 2023-08-19 DIAGNOSIS — Z79899 Other long term (current) drug therapy: Secondary | ICD-10-CM

## 2023-08-19 DIAGNOSIS — Z959 Presence of cardiac and vascular implant and graft, unspecified: Secondary | ICD-10-CM

## 2023-08-19 DIAGNOSIS — I63443 Cerebral infarction due to embolism of bilateral cerebellar arteries: Secondary | ICD-10-CM

## 2023-08-19 DIAGNOSIS — I5032 Chronic diastolic (congestive) heart failure: Secondary | ICD-10-CM

## 2023-08-19 DIAGNOSIS — E1069 Type 1 diabetes mellitus with other specified complication: Secondary | ICD-10-CM | POA: Diagnosis not present

## 2023-08-19 NOTE — Patient Instructions (Signed)
Medication Instructions:  Your Physician recommend you continue on your current medication as directed.    *If you need a refill on your cardiac medications before your next appointment, please call your pharmacy*   Lab Work: Your provider would like for you to have following labs drawn today CBC, BMP and Lipid.   If you have labs (blood work) drawn today and your tests are completely normal, you will receive your results only by: MyChart Message (if you have MyChart) OR A paper copy in the mail If you have any lab test that is abnormal or we need to change your treatment, we will call you to review the results.   Testing/Procedures: None ordered.    Follow-Up: At [facility], you and your health needs are our priority.  As part of our continuing mission to provide you with exceptional heart care, we have created designated Provider Care Teams.  These Care Teams include your primary Cardiologist (physician) and Advanced Practice Providers (APPs -  Physician Assistants and Nurse Practitioners) who all work together to provide you with the care you need, when you need it.  We recommend signing up for the patient portal called "MyChart".  Sign up information is provided on this After Visit Summary.  MyChart is used to connect with patients for Virtual Visits (Telemedicine).  Patients are able to view lab/test results, encounter notes, upcoming appointments, etc.  Non-urgent messages can be sent to your provider as well.   To learn more about what you can do with MyChart, go to [email or web address].    Your next appointment:   6 month(s)  Provider:   You may see Julien Nordmann, MD or one of the following Advanced Practice Providers on your designated Care Team:   Nicolasa Ducking, NP Eula Listen, PA-C Cadence Fransico Michael, PA-C Charlsie Quest, NP
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
## 2023-08-23 ENCOUNTER — Encounter: Payer: Medicare HMO | Attending: Physician Assistant | Admitting: Physician Assistant

## 2023-08-23 DIAGNOSIS — E10622 Type 1 diabetes mellitus with other skin ulcer: Secondary | ICD-10-CM | POA: Insufficient documentation

## 2023-08-23 DIAGNOSIS — I5042 Chronic combined systolic (congestive) and diastolic (congestive) heart failure: Secondary | ICD-10-CM | POA: Diagnosis not present

## 2023-08-23 DIAGNOSIS — L89153 Pressure ulcer of sacral region, stage 3: Secondary | ICD-10-CM | POA: Diagnosis not present

## 2023-08-23 DIAGNOSIS — I110 Hypertensive heart disease with heart failure: Secondary | ICD-10-CM | POA: Diagnosis not present

## 2023-08-23 NOTE — Progress Notes (Signed)
margin is thickened. There is large (67-100%) red granulation within the wound bed. There is no necrotic tissue within the wound bed. 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 Bathing/ Shower/ Hygiene: May shower; gently cleanse wound with antibacterial soap, rinse and pat dry prior to dressing wounds The following medication(s) was prescribed: fluconazole oral  150 mg tablet 1 1 tablet oral take 1 tab by mouth per week x 4 weeks starting 08/23/2023 WOUND #8: - Sacrum Wound Laterality: Midline Cleanser: Byram Ancillary Kit - 15 Day Supply (Generic) 1 x Per Day/30 Days Discharge Instructions: Use supplies as instructed; Kit contains: (15) Saline Bullets; (15) 3x3 Gauze; 15 pr Gloves Cleanser: Soap and Water 1 x Per Day/30 Days Discharge Instructions: Gently cleanse wound with antibacterial soap, rinse and pat dry prior to dressing wounds Prim Dressing: Silvercel Small 2x2 (in/in) (DME) (Generic) 1 x Per Day/30 Days ary Discharge Instructions: Apply Silvercel Small 2x2 (in/in) as instructed Secondary Dressing: (BORDER) Zetuvit Plus SILICONE BORDER Dressing 4x4 (in/in) (DME) (Generic) 1 x Per Day/30 Days 1. I am going to recommend that we have the patient going continue to monitor for any signs of infection or worsening. Based on what I am seeing I do believe that we are making good headway towards complete closure which is great news. 2. I am also can recommend that the patient should continue to offload is much as he can. He is doing a really good job I think the alginate is doing well with a bordered foam and him staying off of this 16 hours a day I know is driving him crazy but it actually has made a tremendous improvement in the overall appearance of his wound and I did encourage him in that regard today as well. 3. I am not going to send in a prescription for him for Diflucan oral since she is not able to do topical antifungals I think this is the best way to go he voiced understanding. Upon inspection patient's wound bed actually showed signs of good granulation and epithelization at this point. We will see patient back for reevaluation in 1 week here in the clinic. If anything worsens or changes patient will contact our office for additional recommendations. Electronic Signature(s) Signed: 08/23/2023 3:35:23 PM By: Allen Derry PA-C Previous  Signature: 08/23/2023 3:34:20 PM Version By: Allen Derry PA-C Entered By: Allen Derry on 08/23/2023 12:35:23 -------------------------------------------------------------------------------- SuperBill Details Patient Name: Date of Service: [name] 08/23/2023 Medical Record Number: [id number] Patient Account Number: [id number] Date of Birth/Sex: Treating RN: [birth year] (52 y.o. [name]) [name] Primary Care Provider: Dale Los Indios Other Clinician: Referring Provider: Treating Provider/Extender: Antony Blackbird Weeks in Treatment: 90 Diagnosis Coding ICD-10 Codes Code Description L89.153 Pressure ulcer of sacral region, stage 3 G82.20 Paraplegia, unspecified E10.621 Type 1 diabetes mellitus with foot ulcer I50.42 Chronic combined systolic (congestive) and diastolic (congestive) heart failure CUTLER, PATRIARCA ([id number]) [id number] Page 10 of 10 Physician Procedures : CPT4 Code Description Modifier [id number] - WC PHYS LEVEL 4 - EST PT ICD-10 Diagnosis Description L89.153 Pressure ulcer of sacral region, stage 3 G82.20 Paraplegia, unspecified E10.621 Type 1 diabetes mellitus with foot ulcer I50.42 Chronic  combined systolic (congestive) and diastolic (congestive) heart failure Quantity: 1 Electronic Signature(s) Signed: 08/23/2023  margin is thickened. There is large (67-100%) red granulation within the wound bed. There is no necrotic tissue within the wound bed. 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 Bathing/ Shower/ Hygiene: May shower; gently cleanse wound with antibacterial soap, rinse and pat dry prior to dressing wounds The following medication(s) was prescribed: fluconazole oral  150 mg tablet 1 1 tablet oral take 1 tab by mouth per week x 4 weeks starting 08/23/2023 WOUND #8: - Sacrum Wound Laterality: Midline Cleanser: Byram Ancillary Kit - 15 Day Supply (Generic) 1 x Per Day/30 Days Discharge Instructions: Use supplies as instructed; Kit contains: (15) Saline Bullets; (15) 3x3 Gauze; 15 pr Gloves Cleanser: Soap and Water 1 x Per Day/30 Days Discharge Instructions: Gently cleanse wound with antibacterial soap, rinse and pat dry prior to dressing wounds Prim Dressing: Silvercel Small 2x2 (in/in) (DME) (Generic) 1 x Per Day/30 Days ary Discharge Instructions: Apply Silvercel Small 2x2 (in/in) as instructed Secondary Dressing: (BORDER) Zetuvit Plus SILICONE BORDER Dressing 4x4 (in/in) (DME) (Generic) 1 x Per Day/30 Days 1. I am going to recommend that we have the patient going continue to monitor for any signs of infection or worsening. Based on what I am seeing I do believe that we are making good headway towards complete closure which is great news. 2. I am also can recommend that the patient should continue to offload is much as he can. He is doing a really good job I think the alginate is doing well with a bordered foam and him staying off of this 16 hours a day I know is driving him crazy but it actually has made a tremendous improvement in the overall appearance of his wound and I did encourage him in that regard today as well. 3. I am not going to send in a prescription for him for Diflucan oral since she is not able to do topical antifungals I think this is the best way to go he voiced understanding. Upon inspection patient's wound bed actually showed signs of good granulation and epithelization at this point. We will see patient back for reevaluation in 1 week here in the clinic. If anything worsens or changes patient will contact our office for additional recommendations. Electronic Signature(s) Signed: 08/23/2023 3:35:23 PM By: Allen Derry PA-C Previous  Signature: 08/23/2023 3:34:20 PM Version By: Allen Derry PA-C Entered By: Allen Derry on 08/23/2023 12:35:23 -------------------------------------------------------------------------------- SuperBill Details Patient Name: Date of Service: [name] 08/23/2023 Medical Record Number: [id number] Patient Account Number: [id number] Date of Birth/Sex: Treating RN: [birth year] (52 y.o. [name]) [name] Primary Care Provider: Dale Los Indios Other Clinician: Referring Provider: Treating Provider/Extender: Antony Blackbird Weeks in Treatment: 90 Diagnosis Coding ICD-10 Codes Code Description L89.153 Pressure ulcer of sacral region, stage 3 G82.20 Paraplegia, unspecified E10.621 Type 1 diabetes mellitus with foot ulcer I50.42 Chronic combined systolic (congestive) and diastolic (congestive) heart failure CUTLER, PATRIARCA ([id number]) [id number] Page 10 of 10 Physician Procedures : CPT4 Code Description Modifier [id number] - WC PHYS LEVEL 4 - EST PT ICD-10 Diagnosis Description L89.153 Pressure ulcer of sacral region, stage 3 G82.20 Paraplegia, unspecified E10.621 Type 1 diabetes mellitus with foot ulcer I50.42 Chronic  combined systolic (congestive) and diastolic (congestive) heart failure Quantity: 1 Electronic Signature(s) Signed: 08/23/2023  margin is thickened. There is large (67-100%) red granulation within the wound bed. There is no necrotic tissue within the wound bed. 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 Bathing/ Shower/ Hygiene: May shower; gently cleanse wound with antibacterial soap, rinse and pat dry prior to dressing wounds The following medication(s) was prescribed: fluconazole oral  150 mg tablet 1 1 tablet oral take 1 tab by mouth per week x 4 weeks starting 08/23/2023 WOUND #8: - Sacrum Wound Laterality: Midline Cleanser: Byram Ancillary Kit - 15 Day Supply (Generic) 1 x Per Day/30 Days Discharge Instructions: Use supplies as instructed; Kit contains: (15) Saline Bullets; (15) 3x3 Gauze; 15 pr Gloves Cleanser: Soap and Water 1 x Per Day/30 Days Discharge Instructions: Gently cleanse wound with antibacterial soap, rinse and pat dry prior to dressing wounds Prim Dressing: Silvercel Small 2x2 (in/in) (DME) (Generic) 1 x Per Day/30 Days ary Discharge Instructions: Apply Silvercel Small 2x2 (in/in) as instructed Secondary Dressing: (BORDER) Zetuvit Plus SILICONE BORDER Dressing 4x4 (in/in) (DME) (Generic) 1 x Per Day/30 Days 1. I am going to recommend that we have the patient going continue to monitor for any signs of infection or worsening. Based on what I am seeing I do believe that we are making good headway towards complete closure which is great news. 2. I am also can recommend that the patient should continue to offload is much as he can. He is doing a really good job I think the alginate is doing well with a bordered foam and him staying off of this 16 hours a day I know is driving him crazy but it actually has made a tremendous improvement in the overall appearance of his wound and I did encourage him in that regard today as well. 3. I am not going to send in a prescription for him for Diflucan oral since she is not able to do topical antifungals I think this is the best way to go he voiced understanding. Upon inspection patient's wound bed actually showed signs of good granulation and epithelization at this point. We will see patient back for reevaluation in 1 week here in the clinic. If anything worsens or changes patient will contact our office for additional recommendations. Electronic Signature(s) Signed: 08/23/2023 3:35:23 PM By: Allen Derry PA-C Previous  Signature: 08/23/2023 3:34:20 PM Version By: Allen Derry PA-C Entered By: Allen Derry on 08/23/2023 12:35:23 -------------------------------------------------------------------------------- SuperBill Details Patient Name: Date of Service: [name] 08/23/2023 Medical Record Number: [id number] Patient Account Number: [id number] Date of Birth/Sex: Treating RN: [birth year] (52 y.o. [name]) [name] Primary Care Provider: Dale Los Indios Other Clinician: Referring Provider: Treating Provider/Extender: Antony Blackbird Weeks in Treatment: 90 Diagnosis Coding ICD-10 Codes Code Description L89.153 Pressure ulcer of sacral region, stage 3 G82.20 Paraplegia, unspecified E10.621 Type 1 diabetes mellitus with foot ulcer I50.42 Chronic combined systolic (congestive) and diastolic (congestive) heart failure CUTLER, PATRIARCA ([id number]) [id number] Page 10 of 10 Physician Procedures : CPT4 Code Description Modifier [id number] - WC PHYS LEVEL 4 - EST PT ICD-10 Diagnosis Description L89.153 Pressure ulcer of sacral region, stage 3 G82.20 Paraplegia, unspecified E10.621 Type 1 diabetes mellitus with foot ulcer I50.42 Chronic  combined systolic (congestive) and diastolic (congestive) heart failure Quantity: 1 Electronic Signature(s) Signed: 08/23/2023  margin is thickened. There is large (67-100%) red granulation within the wound bed. There is no necrotic tissue within the wound bed. 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 Bathing/ Shower/ Hygiene: May shower; gently cleanse wound with antibacterial soap, rinse and pat dry prior to dressing wounds The following medication(s) was prescribed: fluconazole oral  150 mg tablet 1 1 tablet oral take 1 tab by mouth per week x 4 weeks starting 08/23/2023 WOUND #8: - Sacrum Wound Laterality: Midline Cleanser: Byram Ancillary Kit - 15 Day Supply (Generic) 1 x Per Day/30 Days Discharge Instructions: Use supplies as instructed; Kit contains: (15) Saline Bullets; (15) 3x3 Gauze; 15 pr Gloves Cleanser: Soap and Water 1 x Per Day/30 Days Discharge Instructions: Gently cleanse wound with antibacterial soap, rinse and pat dry prior to dressing wounds Prim Dressing: Silvercel Small 2x2 (in/in) (DME) (Generic) 1 x Per Day/30 Days ary Discharge Instructions: Apply Silvercel Small 2x2 (in/in) as instructed Secondary Dressing: (BORDER) Zetuvit Plus SILICONE BORDER Dressing 4x4 (in/in) (DME) (Generic) 1 x Per Day/30 Days 1. I am going to recommend that we have the patient going continue to monitor for any signs of infection or worsening. Based on what I am seeing I do believe that we are making good headway towards complete closure which is great news. 2. I am also can recommend that the patient should continue to offload is much as he can. He is doing a really good job I think the alginate is doing well with a bordered foam and him staying off of this 16 hours a day I know is driving him crazy but it actually has made a tremendous improvement in the overall appearance of his wound and I did encourage him in that regard today as well. 3. I am not going to send in a prescription for him for Diflucan oral since she is not able to do topical antifungals I think this is the best way to go he voiced understanding. Upon inspection patient's wound bed actually showed signs of good granulation and epithelization at this point. We will see patient back for reevaluation in 1 week here in the clinic. If anything worsens or changes patient will contact our office for additional recommendations. Electronic Signature(s) Signed: 08/23/2023 3:35:23 PM By: Allen Derry PA-C Previous  Signature: 08/23/2023 3:34:20 PM Version By: Allen Derry PA-C Entered By: Allen Derry on 08/23/2023 12:35:23 -------------------------------------------------------------------------------- SuperBill Details Patient Name: Date of Service: [name] 08/23/2023 Medical Record Number: [id number] Patient Account Number: [id number] Date of Birth/Sex: Treating RN: [birth year] (52 y.o. [name]) [name] Primary Care Provider: Dale Los Indios Other Clinician: Referring Provider: Treating Provider/Extender: Antony Blackbird Weeks in Treatment: 90 Diagnosis Coding ICD-10 Codes Code Description L89.153 Pressure ulcer of sacral region, stage 3 G82.20 Paraplegia, unspecified E10.621 Type 1 diabetes mellitus with foot ulcer I50.42 Chronic combined systolic (congestive) and diastolic (congestive) heart failure CUTLER, PATRIARCA ([id number]) [id number] Page 10 of 10 Physician Procedures : CPT4 Code Description Modifier [id number] 99214 - WC PHYS LEVEL 4 - EST PT ICD-10 Diagnosis Description L89.153 Pressure ulcer of sacral region, stage 3 G82.20 Paraplegia, unspecified E10.621 Type 1 diabetes mellitus with foot ulcer I50.42 Chronic  combined systolic (congestive) and diastolic (congestive) heart failure Quantity: 1 Electronic Signature(s) Signed: 08/23/2023  Oriented x 3. pleasant and cooperative. Notes Upon inspection patient's wound is dramatically improved from a size perspective the only issue I see is he does have some issue with what appears to be more of a fungal infection I do think he may benefit from a oral antifungal due to the fact that with the location and having to do this  dressing himself is not to be able to use any powders around this region and it is in the immediate periwound where he is having put the dressing on. Otherwise the wound itself looks to be doing so much better. Electronic Signature(s) Signed: 08/23/2023 3:32:25 PM By: Allen Derry PA-C Entered By: Allen Derry on 08/23/2023 12:32:25 -------------------------------------------------------------------------------- Physician Orders Details Patient Name: Date of Service: [name] 08/23/2023 3:15 PM Medical Record Number: [id number] Patient Account Number: [id number] Date of Birth/Sex: Treating RN: 03/27/1971 (52 y.o. [name]) [name] Primary Care Provider: [facility] Other Clinician: Referring Provider: Treating Provider/Extender: Antony Blackbird Weeks in Treatment: 58 Verbal / Phone Orders: No Diagnosis Coding ICD-10 Coding Code Description L89.153 Pressure ulcer of sacral region, stage 3 G82.20 Paraplegia, unspecified E10.621 Type 1 diabetes mellitus with foot ulcer I50.42 Chronic combined systolic (congestive) and diastolic (congestive) heart failure Follow-up Appointments NAZIRE, CHISMAN ([id number]) [id number] Page 5 of 10 ppointment in 1 month - Patient request. Return A Bathing/ Shower/ Hygiene May shower; gently cleanse wound with antibacterial soap, rinse and pat dry prior to dressing wounds Wound Treatment Wound #8 - Sacrum Wound Laterality: Midline Cleanser: Byram Ancillary Kit - 15 Day Supply (Generic) 1 x Per Day/30 Days Discharge Instructions: Use supplies as instructed; Kit contains: (15) Saline Bullets; (15) 3x3 Gauze; 15 pr Gloves Cleanser: Soap and Water 1 x Per Day/30 Days Discharge Instructions: Gently cleanse wound with antibacterial soap, rinse and pat dry prior to dressing wounds Prim Dressing: Silvercel Small 2x2 (in/in) (DME) (Generic) 1 x Per Day/30 Days ary Discharge Instructions: Apply Silvercel Small 2x2  (in/in) as instructed Secondary Dressing: (BORDER) Zetuvit Plus SILICONE BORDER Dressing 4x4 (in/in) (DME) (Generic) 1 x Per Day/30 Days Patient Medications llergies: Sulfa (Sulfonamide Antibiotics), Iodinated Contrast Media - IV Dye, [place]fed (pseudoephedrine) A Notifications Medication Indication Start End 08/23/2023 fluconazole DOSE 1 - oral 150 mg tablet - 1 tablet oral take 1 tab by mouth per week x 4 weeks Electronic Signature(s) Signed: 08/23/2023 3:34:04 PM By: Allen Derry PA-C Entered By: Allen Derry on 08/23/2023 12:34:04 -------------------------------------------------------------------------------- Problem List Details Patient Name: Date of Service: [name] 08/23/2023 3:15 PM Medical Record Number: [id number] Patient Account Number: [id number] Date of Birth/Sex: Treating RN: 07/18/1971 (52 y.o. [name] Primary Care Provider: Dale Boyds Other Clinician: Referring Provider: Treating Provider/Extender: Antony Blackbird Weeks in Treatment: 66 Active Problems ICD-10 Encounter Code Description Active Date MDM Diagnosis L89.153 Pressure ulcer of sacral region, stage 3 11/27/2021 No Yes G82.20 Paraplegia, unspecified 11/27/2021 No Yes E10.621 Type 1 diabetes mellitus with foot ulcer 11/27/2021 No Yes I50.42 Chronic combined systolic (congestive) and diastolic (congestive) heart failure 11/27/2021 No Yes Pargas, Hillery Hunter ([id number]) [id number] Page 6 of 10 Inactive Problems Resolved Problems Electronic Signature(s) Signed: 08/23/2023 3:25:46 PM By: Allen Derry PA-C Entered By: Allen Derry on 08/23/2023 12:25:46 -------------------------------------------------------------------------------- Progress Note Details Patient Name: Date of Service: [name] 08/23/2023 3:15 PM Medical Record Number: [id number] Patient Account Number: [id number] Date of Birth/Sex: Treating RN: 15-Sep-1971 (52 y.o. [name] Primary Care Provider: Dale [place] Other Clinician: Referring Provider: Treating  referral for that was back in October but again he never saw anyone.  We did have some conversation in that regard today. 04-26-2023 upon evaluation today patient appears to be doing really about the same in regard to his wound. He did see the plastic surgeon and apparently the recommendation was for several things including a new bed, new cushion, some physical therapy to help with transfers and upper body strengthening, and subsequently they also recommended that he should be seen at least twice a month for debridements. Again in the past we have not really done much in the way of debridements due to the fact that he really has not had much to debride to be perfectly honest. The patient is in agreement with plan he states he really does not see any need to come twice a month for that he wants [name] to continue his monthly visits. 05-25-2023 upon evaluation today patient appears to be doing about the same in regard to his wound is actually measuring a little bit smaller however which is good news. Fortunately I do not see any signs of active infection locally or systemically which is great news and in general I think that we are moving in the right direction. 7/22. Difficult wound just below his coccyx. He has been using silver alginate as the primary dressing. He comes here for monthly follow-up. He apparently sees plastic surgery at the wound care center at [facility] as well. He has been for an initial consultation there 07-26-2023 upon evaluation today patient appears to be doing poorly currently in regard to his wound. He has been tolerating the dressing changes but unfortunately has a lot more irritation and excoriation at this point. He does have a new chair and a new cushion though he tells me the center part of the cushion he took out because it was hurting him. With that being said I am not certain that that was necessarily the best movement that seems like this may have caused some irritation. He tells me however it was extremely uncomfortable. I explained that I  think he needs to get in touch with new motion and see what they can figure out here for him. 08-23-2023 upon evaluation today patient actually appears to be doing the best he has for quite some time. Fortunately there does not appear to be any signs of active infection at this time as far as a bacterial infection is concerned he has T me that he has been trying to stay off of this is much as he could. old Fortunately I do not see any evidence of infection overall systemically either which is good news. This is actually the best change of seen in his wound from visit to visit since I have been taking care of him. He tells me has been spending about 16 hours a day off of this. Electronic Signature(s) Signed: 08/23/2023 3:31:57 PM By: Allen Derry PA-C Entered By: Allen Derry on 08/23/2023 12:31:57 Neighbors, Hillery Hunter ([id number]) [id number] Page 4 of 10 -------------------------------------------------------------------------------- Physical Exam Details Patient Name: Date of Service: LAWENCE, JERVIS 08/23/2023 3:15 PM Medical Record Number: [id number] Patient Account Number: [id number] Date of Birth/Sex: Treating RN: 1971-05-19 (52 y.o. [name] Primary Care Provider: Dale [place] Other Clinician: Referring Provider: Treating Provider/Extender: Antony Blackbird Weeks in Treatment: 74 Constitutional Well-nourished and well-hydrated in no acute distress. Respiratory normal breathing without difficulty. Psychiatric this patient is able to make decisions and demonstrates good insight into disease process. Alert and  referral for that was back in October but again he never saw anyone.  We did have some conversation in that regard today. 04-26-2023 upon evaluation today patient appears to be doing really about the same in regard to his wound. He did see the plastic surgeon and apparently the recommendation was for several things including a new bed, new cushion, some physical therapy to help with transfers and upper body strengthening, and subsequently they also recommended that he should be seen at least twice a month for debridements. Again in the past we have not really done much in the way of debridements due to the fact that he really has not had much to debride to be perfectly honest. The patient is in agreement with plan he states he really does not see any need to come twice a month for that he wants [name] to continue his monthly visits. 05-25-2023 upon evaluation today patient appears to be doing about the same in regard to his wound is actually measuring a little bit smaller however which is good news. Fortunately I do not see any signs of active infection locally or systemically which is great news and in general I think that we are moving in the right direction. 7/22. Difficult wound just below his coccyx. He has been using silver alginate as the primary dressing. He comes here for monthly follow-up. He apparently sees plastic surgery at the wound care center at [facility] as well. He has been for an initial consultation there 07-26-2023 upon evaluation today patient appears to be doing poorly currently in regard to his wound. He has been tolerating the dressing changes but unfortunately has a lot more irritation and excoriation at this point. He does have a new chair and a new cushion though he tells me the center part of the cushion he took out because it was hurting him. With that being said I am not certain that that was necessarily the best movement that seems like this may have caused some irritation. He tells me however it was extremely uncomfortable. I explained that I  think he needs to get in touch with new motion and see what they can figure out here for him. 08-23-2023 upon evaluation today patient actually appears to be doing the best he has for quite some time. Fortunately there does not appear to be any signs of active infection at this time as far as a bacterial infection is concerned he has T me that he has been trying to stay off of this is much as he could. old Fortunately I do not see any evidence of infection overall systemically either which is good news. This is actually the best change of seen in his wound from visit to visit since I have been taking care of him. He tells me has been spending about 16 hours a day off of this. Electronic Signature(s) Signed: 08/23/2023 3:31:57 PM By: Allen Derry PA-C Entered By: Allen Derry on 08/23/2023 12:31:57 Neighbors, Hillery Hunter ([id number]) [id number] Page 4 of 10 -------------------------------------------------------------------------------- Physical Exam Details Patient Name: Date of Service: LAWENCE, JERVIS 08/23/2023 3:15 PM Medical Record Number: [id number] Patient Account Number: [id number] Date of Birth/Sex: Treating RN: 1971-05-19 (52 y.o. [name] Primary Care Provider: Dale Hatfield Other Clinician: Referring Provider: Treating Provider/Extender: Antony Blackbird Weeks in Treatment: 74 Constitutional Well-nourished and well-hydrated in no acute distress. Respiratory normal breathing without difficulty. Psychiatric this patient is able to make decisions and demonstrates good insight into disease process. Alert and  margin is thickened. There is large (67-100%) red granulation within the wound bed. There is no necrotic tissue within the wound bed. 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 Bathing/ Shower/ Hygiene: May shower; gently cleanse wound with antibacterial soap, rinse and pat dry prior to dressing wounds The following medication(s) was prescribed: fluconazole oral  150 mg tablet 1 1 tablet oral take 1 tab by mouth per week x 4 weeks starting 08/23/2023 WOUND #8: - Sacrum Wound Laterality: Midline Cleanser: Byram Ancillary Kit - 15 Day Supply (Generic) 1 x Per Day/30 Days Discharge Instructions: Use supplies as instructed; Kit contains: (15) Saline Bullets; (15) 3x3 Gauze; 15 pr Gloves Cleanser: Soap and Water 1 x Per Day/30 Days Discharge Instructions: Gently cleanse wound with antibacterial soap, rinse and pat dry prior to dressing wounds Prim Dressing: Silvercel Small 2x2 (in/in) (DME) (Generic) 1 x Per Day/30 Days ary Discharge Instructions: Apply Silvercel Small 2x2 (in/in) as instructed Secondary Dressing: (BORDER) Zetuvit Plus SILICONE BORDER Dressing 4x4 (in/in) (DME) (Generic) 1 x Per Day/30 Days 1. I am going to recommend that we have the patient going continue to monitor for any signs of infection or worsening. Based on what I am seeing I do believe that we are making good headway towards complete closure which is great news. 2. I am also can recommend that the patient should continue to offload is much as he can. He is doing a really good job I think the alginate is doing well with a bordered foam and him staying off of this 16 hours a day I know is driving him crazy but it actually has made a tremendous improvement in the overall appearance of his wound and I did encourage him in that regard today as well. 3. I am not going to send in a prescription for him for Diflucan oral since she is not able to do topical antifungals I think this is the best way to go he voiced understanding. Upon inspection patient's wound bed actually showed signs of good granulation and epithelization at this point. We will see patient back for reevaluation in 1 week here in the clinic. If anything worsens or changes patient will contact our office for additional recommendations. Electronic Signature(s) Signed: 08/23/2023 3:35:23 PM By: Allen Derry PA-C Previous  Signature: 08/23/2023 3:34:20 PM Version By: Allen Derry PA-C Entered By: Allen Derry on 08/23/2023 12:35:23 -------------------------------------------------------------------------------- SuperBill Details Patient Name: Date of Service: [name] 08/23/2023 Medical Record Number: [id number] Patient Account Number: [id number] Date of Birth/Sex: Treating RN: [birth year] (52 y.o. [name]) [name] Primary Care Provider: Dale [place] Other Clinician: Referring Provider: Treating Provider/Extender: Antony Blackbird Weeks in Treatment: 90 Diagnosis Coding ICD-10 Codes Code Description L89.153 Pressure ulcer of sacral region, stage 3 G82.20 Paraplegia, unspecified E10.621 Type 1 diabetes mellitus with foot ulcer I50.42 Chronic combined systolic (congestive) and diastolic (congestive) heart failure CUTLER, PATRIARCA ([id number]) [id number] Page 10 of 10 Physician Procedures : CPT4 Code Description Modifier [id number] - WC PHYS LEVEL 4 - EST PT ICD-10 Diagnosis Description L89.153 Pressure ulcer of sacral region, stage 3 G82.20 Paraplegia, unspecified E10.621 Type 1 diabetes mellitus with foot ulcer I50.42 Chronic  combined systolic (congestive) and diastolic (congestive) heart failure Quantity: 1 Electronic Signature(s) Signed: 08/23/2023  Oriented x 3. pleasant and cooperative. Notes Upon inspection patient's wound is dramatically improved from a size perspective the only issue I see is he does have some issue with what appears to be more of a fungal infection I do think he may benefit from a oral antifungal due to the fact that with the location and having to do this  dressing himself is not to be able to use any powders around this region and it is in the immediate periwound where he is having put the dressing on. Otherwise the wound itself looks to be doing so much better. Electronic Signature(s) Signed: 08/23/2023 3:32:25 PM By: Allen Derry PA-C Entered By: Allen Derry on 08/23/2023 12:32:25 -------------------------------------------------------------------------------- Physician Orders Details Patient Name: Date of Service: [name] 08/23/2023 3:15 PM Medical Record Number: [id number] Patient Account Number: [id number] Date of Birth/Sex: Treating RN: 03/27/1971 (52 y.o. [name]) [name] Primary Care Provider: Dale [facility] Other Clinician: Referring Provider: Treating Provider/Extender: Antony Blackbird Weeks in Treatment: 58 Verbal / Phone Orders: No Diagnosis Coding ICD-10 Coding Code Description L89.153 Pressure ulcer of sacral region, stage 3 G82.20 Paraplegia, unspecified E10.621 Type 1 diabetes mellitus with foot ulcer I50.42 Chronic combined systolic (congestive) and diastolic (congestive) heart failure Follow-up Appointments NAZIRE, CHISMAN ([id number]) [id number] Page 5 of 10 ppointment in 1 month - Patient request. Return A Bathing/ Shower/ Hygiene May shower; gently cleanse wound with antibacterial soap, rinse and pat dry prior to dressing wounds Wound Treatment Wound #8 - Sacrum Wound Laterality: Midline Cleanser: Byram Ancillary Kit - 15 Day Supply (Generic) 1 x Per Day/30 Days Discharge Instructions: Use supplies as instructed; Kit contains: (15) Saline Bullets; (15) 3x3 Gauze; 15 pr Gloves Cleanser: Soap and Water 1 x Per Day/30 Days Discharge Instructions: Gently cleanse wound with antibacterial soap, rinse and pat dry prior to dressing wounds Prim Dressing: Silvercel Small 2x2 (in/in) (DME) (Generic) 1 x Per Day/30 Days ary Discharge Instructions: Apply Silvercel Small 2x2  (in/in) as instructed Secondary Dressing: (BORDER) Zetuvit Plus SILICONE BORDER Dressing 4x4 (in/in) (DME) (Generic) 1 x Per Day/30 Days Patient Medications llergies: Sulfa (Sulfonamide Antibiotics), Iodinated Contrast Media - IV Dye, Allerfed (pseudoephedrine) A Notifications Medication Indication Start End 08/23/2023 fluconazole DOSE 1 - oral 150 mg tablet - 1 tablet oral take 1 tab by mouth per week x 4 weeks Electronic Signature(s) Signed: 08/23/2023 3:34:04 PM By: Allen Derry PA-C Entered By: Allen Derry on 08/23/2023 12:34:04 -------------------------------------------------------------------------------- Problem List Details Patient Name: Date of Service: [name] 08/23/2023 3:15 PM Medical Record Number: [id number] Patient Account Number: [id number] Date of Birth/Sex: Treating RN: 07/18/1971 (52 y.o. [name] Primary Care Provider: Dale Boyds Other Clinician: Referring Provider: Treating Provider/Extender: Antony Blackbird Weeks in Treatment: 66 Active Problems ICD-10 Encounter Code Description Active Date MDM Diagnosis L89.153 Pressure ulcer of sacral region, stage 3 11/27/2021 No Yes G82.20 Paraplegia, unspecified 11/27/2021 No Yes E10.621 Type 1 diabetes mellitus with foot ulcer 11/27/2021 No Yes I50.42 Chronic combined systolic (congestive) and diastolic (congestive) heart failure 11/27/2021 No Yes Pargas, Hillery Hunter ([id number]) [id number] Page 6 of 10 Inactive Problems Resolved Problems Electronic Signature(s) Signed: 08/23/2023 3:25:46 PM By: Allen Derry PA-C Entered By: Allen Derry on 08/23/2023 12:25:46 -------------------------------------------------------------------------------- Progress Note Details Patient Name: Date of Service: [name] 08/23/2023 3:15 PM Medical Record Number: [id number] Patient Account Number: [id number] Date of Birth/Sex: Treating RN: 15-Sep-1971 (52 y.o. [name] Primary Care Provider: Dale [place] Other Clinician: Referring Provider: Treating  referral for that was back in October but again he never saw anyone.  We did have some conversation in that regard today. 04-26-2023 upon evaluation today patient appears to be doing really about the same in regard to his wound. He did see the plastic surgeon and apparently the recommendation was for several things including a new bed, new cushion, some physical therapy to help with transfers and upper body strengthening, and subsequently they also recommended that he should be seen at least twice a month for debridements. Again in the past we have not really done much in the way of debridements due to the fact that he really has not had much to debride to be perfectly honest. The patient is in agreement with plan he states he really does not see any need to come twice a month for that he wants [name] to continue his monthly visits. 05-25-2023 upon evaluation today patient appears to be doing about the same in regard to his wound is actually measuring a little bit smaller however which is good news. Fortunately I do not see any signs of active infection locally or systemically which is great news and in general I think that we are moving in the right direction. 7/22. Difficult wound just below his coccyx. He has been using silver alginate as the primary dressing. He comes here for monthly follow-up. He apparently sees plastic surgery at the wound care center at [facility] as well. He has been for an initial consultation there 07-26-2023 upon evaluation today patient appears to be doing poorly currently in regard to his wound. He has been tolerating the dressing changes but unfortunately has a lot more irritation and excoriation at this point. He does have a new chair and a new cushion though he tells me the center part of the cushion he took out because it was hurting him. With that being said I am not certain that that was necessarily the best movement that seems like this may have caused some irritation. He tells me however it was extremely uncomfortable. I explained that I  think he needs to get in touch with new motion and see what they can figure out here for him. 08-23-2023 upon evaluation today patient actually appears to be doing the best he has for quite some time. Fortunately there does not appear to be any signs of active infection at this time as far as a bacterial infection is concerned he has T me that he has been trying to stay off of this is much as he could. old Fortunately I do not see any evidence of infection overall systemically either which is good news. This is actually the best change of seen in his wound from visit to visit since I have been taking care of him. He tells me has been spending about 16 hours a day off of this. Electronic Signature(s) Signed: 08/23/2023 3:31:57 PM By: Allen Derry PA-C Entered By: Allen Derry on 08/23/2023 12:31:57 Neighbors, Hillery Hunter ([id number]) [id number] Page 4 of 10 -------------------------------------------------------------------------------- Physical Exam Details Patient Name: Date of Service: LAWENCE, JERVIS 08/23/2023 3:15 PM Medical Record Number: [id number] Patient Account Number: [id number] Date of Birth/Sex: Treating RN: 1971-05-19 (52 y.o. [name] Primary Care Provider: Dale Hatfield Other Clinician: Referring Provider: Treating Provider/Extender: Antony Blackbird Weeks in Treatment: 74 Constitutional Well-nourished and well-hydrated in no acute distress. Respiratory normal breathing without difficulty. Psychiatric this patient is able to make decisions and demonstrates good insight into disease process. Alert and  margin is thickened. There is large (67-100%) red granulation within the wound bed. There is no necrotic tissue within the wound bed. 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 Bathing/ Shower/ Hygiene: May shower; gently cleanse wound with antibacterial soap, rinse and pat dry prior to dressing wounds The following medication(s) was prescribed: fluconazole oral  150 mg tablet 1 1 tablet oral take 1 tab by mouth per week x 4 weeks starting 08/23/2023 WOUND #8: - Sacrum Wound Laterality: Midline Cleanser: Byram Ancillary Kit - 15 Day Supply (Generic) 1 x Per Day/30 Days Discharge Instructions: Use supplies as instructed; Kit contains: (15) Saline Bullets; (15) 3x3 Gauze; 15 pr Gloves Cleanser: Soap and Water 1 x Per Day/30 Days Discharge Instructions: Gently cleanse wound with antibacterial soap, rinse and pat dry prior to dressing wounds Prim Dressing: Silvercel Small 2x2 (in/in) (DME) (Generic) 1 x Per Day/30 Days ary Discharge Instructions: Apply Silvercel Small 2x2 (in/in) as instructed Secondary Dressing: (BORDER) Zetuvit Plus SILICONE BORDER Dressing 4x4 (in/in) (DME) (Generic) 1 x Per Day/30 Days 1. I am going to recommend that we have the patient going continue to monitor for any signs of infection or worsening. Based on what I am seeing I do believe that we are making good headway towards complete closure which is great news. 2. I am also can recommend that the patient should continue to offload is much as he can. He is doing a really good job I think the alginate is doing well with a bordered foam and him staying off of this 16 hours a day I know is driving him crazy but it actually has made a tremendous improvement in the overall appearance of his wound and I did encourage him in that regard today as well. 3. I am not going to send in a prescription for him for Diflucan oral since she is not able to do topical antifungals I think this is the best way to go he voiced understanding. Upon inspection patient's wound bed actually showed signs of good granulation and epithelization at this point. We will see patient back for reevaluation in 1 week here in the clinic. If anything worsens or changes patient will contact our office for additional recommendations. Electronic Signature(s) Signed: 08/23/2023 3:35:23 PM By: Allen Derry PA-C Previous  Signature: 08/23/2023 3:34:20 PM Version By: Allen Derry PA-C Entered By: Allen Derry on 08/23/2023 12:35:23 -------------------------------------------------------------------------------- SuperBill Details Patient Name: Date of Service: [name] 08/23/2023 Medical Record Number: [id number] Patient Account Number: [id number] Date of Birth/Sex: Treating RN: [birth year] (52 y.o. [name]) [name] Primary Care Provider: Dale [place] Other Clinician: Referring Provider: Treating Provider/Extender: Antony Blackbird Weeks in Treatment: 90 Diagnosis Coding ICD-10 Codes Code Description L89.153 Pressure ulcer of sacral region, stage 3 G82.20 Paraplegia, unspecified E10.621 Type 1 diabetes mellitus with foot ulcer I50.42 Chronic combined systolic (congestive) and diastolic (congestive) heart failure CUTLER, PATRIARCA ([id number]) [id number] Page 10 of 10 Physician Procedures : CPT4 Code Description Modifier [id number] - WC PHYS LEVEL 4 - EST PT ICD-10 Diagnosis Description L89.153 Pressure ulcer of sacral region, stage 3 G82.20 Paraplegia, unspecified E10.621 Type 1 diabetes mellitus with foot ulcer I50.42 Chronic  combined systolic (congestive) and diastolic (congestive) heart failure Quantity: 1 Electronic Signature(s) Signed: 08/23/2023  margin is thickened. There is large (67-100%) red granulation within the wound bed. There is no necrotic tissue within the wound bed. 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 Bathing/ Shower/ Hygiene: May shower; gently cleanse wound with antibacterial soap, rinse and pat dry prior to dressing wounds The following medication(s) was prescribed: fluconazole oral  150 mg tablet 1 1 tablet oral take 1 tab by mouth per week x 4 weeks starting 08/23/2023 WOUND #8: - Sacrum Wound Laterality: Midline Cleanser: Byram Ancillary Kit - 15 Day Supply (Generic) 1 x Per Day/30 Days Discharge Instructions: Use supplies as instructed; Kit contains: (15) Saline Bullets; (15) 3x3 Gauze; 15 pr Gloves Cleanser: Soap and Water 1 x Per Day/30 Days Discharge Instructions: Gently cleanse wound with antibacterial soap, rinse and pat dry prior to dressing wounds Prim Dressing: Silvercel Small 2x2 (in/in) (DME) (Generic) 1 x Per Day/30 Days ary Discharge Instructions: Apply Silvercel Small 2x2 (in/in) as instructed Secondary Dressing: (BORDER) Zetuvit Plus SILICONE BORDER Dressing 4x4 (in/in) (DME) (Generic) 1 x Per Day/30 Days 1. I am going to recommend that we have the patient going continue to monitor for any signs of infection or worsening. Based on what I am seeing I do believe that we are making good headway towards complete closure which is great news. 2. I am also can recommend that the patient should continue to offload is much as he can. He is doing a really good job I think the alginate is doing well with a bordered foam and him staying off of this 16 hours a day I know is driving him crazy but it actually has made a tremendous improvement in the overall appearance of his wound and I did encourage him in that regard today as well. 3. I am not going to send in a prescription for him for Diflucan oral since she is not able to do topical antifungals I think this is the best way to go he voiced understanding. Upon inspection patient's wound bed actually showed signs of good granulation and epithelization at this point. We will see patient back for reevaluation in 1 week here in the clinic. If anything worsens or changes patient will contact our office for additional recommendations. Electronic Signature(s) Signed: 08/23/2023 3:35:23 PM By: Allen Derry PA-C Previous  Signature: 08/23/2023 3:34:20 PM Version By: Allen Derry PA-C Entered By: Allen Derry on 08/23/2023 12:35:23 -------------------------------------------------------------------------------- SuperBill Details Patient Name: Date of Service: [name] 08/23/2023 Medical Record Number: [id number] Patient Account Number: [id number] Date of Birth/Sex: Treating RN: [birth year] (52 y.o. [name]) [name] Primary Care Provider: Dale [place] Other Clinician: Referring Provider: Treating Provider/Extender: Antony Blackbird Weeks in Treatment: 90 Diagnosis Coding ICD-10 Codes Code Description L89.153 Pressure ulcer of sacral region, stage 3 G82.20 Paraplegia, unspecified E10.621 Type 1 diabetes mellitus with foot ulcer I50.42 Chronic combined systolic (congestive) and diastolic (congestive) heart failure CUTLER, PATRIARCA ([id number]) [id number] Page 10 of 10 Physician Procedures : CPT4 Code Description Modifier [id number] - WC PHYS LEVEL 4 - EST PT ICD-10 Diagnosis Description L89.153 Pressure ulcer of sacral region, stage 3 G82.20 Paraplegia, unspecified E10.621 Type 1 diabetes mellitus with foot ulcer I50.42 Chronic  combined systolic (congestive) and diastolic (congestive) heart failure Quantity: 1 Electronic Signature(s) Signed: 08/23/2023  margin is thickened. There is large (67-100%) red granulation within the wound bed. There is no necrotic tissue within the wound bed. 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 Bathing/ Shower/ Hygiene: May shower; gently cleanse wound with antibacterial soap, rinse and pat dry prior to dressing wounds The following medication(s) was prescribed: fluconazole oral  150 mg tablet 1 1 tablet oral take 1 tab by mouth per week x 4 weeks starting 08/23/2023 WOUND #8: - Sacrum Wound Laterality: Midline Cleanser: Byram Ancillary Kit - 15 Day Supply (Generic) 1 x Per Day/30 Days Discharge Instructions: Use supplies as instructed; Kit contains: (15) Saline Bullets; (15) 3x3 Gauze; 15 pr Gloves Cleanser: Soap and Water 1 x Per Day/30 Days Discharge Instructions: Gently cleanse wound with antibacterial soap, rinse and pat dry prior to dressing wounds Prim Dressing: Silvercel Small 2x2 (in/in) (DME) (Generic) 1 x Per Day/30 Days ary Discharge Instructions: Apply Silvercel Small 2x2 (in/in) as instructed Secondary Dressing: (BORDER) Zetuvit Plus SILICONE BORDER Dressing 4x4 (in/in) (DME) (Generic) 1 x Per Day/30 Days 1. I am going to recommend that we have the patient going continue to monitor for any signs of infection or worsening. Based on what I am seeing I do believe that we are making good headway towards complete closure which is great news. 2. I am also can recommend that the patient should continue to offload is much as he can. He is doing a really good job I think the alginate is doing well with a bordered foam and him staying off of this 16 hours a day I know is driving him crazy but it actually has made a tremendous improvement in the overall appearance of his wound and I did encourage him in that regard today as well. 3. I am not going to send in a prescription for him for Diflucan oral since she is not able to do topical antifungals I think this is the best way to go he voiced understanding. Upon inspection patient's wound bed actually showed signs of good granulation and epithelization at this point. We will see patient back for reevaluation in 1 week here in the clinic. If anything worsens or changes patient will contact our office for additional recommendations. Electronic Signature(s) Signed: 08/23/2023 3:35:23 PM By: Allen Derry PA-C Previous  Signature: 08/23/2023 3:34:20 PM Version By: Allen Derry PA-C Entered By: Allen Derry on 08/23/2023 12:35:23 -------------------------------------------------------------------------------- SuperBill Details Patient Name: Date of Service: [name] 08/23/2023 Medical Record Number: [id number] Patient Account Number: [id number] Date of Birth/Sex: Treating RN: [birth year] (52 y.o. [name]) [name] Primary Care Provider: Dale [place] Other Clinician: Referring Provider: Treating Provider/Extender: Antony Blackbird Weeks in Treatment: 90 Diagnosis Coding ICD-10 Codes Code Description L89.153 Pressure ulcer of sacral region, stage 3 G82.20 Paraplegia, unspecified E10.621 Type 1 diabetes mellitus with foot ulcer I50.42 Chronic combined systolic (congestive) and diastolic (congestive) heart failure CUTLER, PATRIARCA ([id number]) [id number] Page 10 of 10 Physician Procedures : CPT4 Code Description Modifier [id number] - WC PHYS LEVEL 4 - EST PT ICD-10 Diagnosis Description L89.153 Pressure ulcer of sacral region, stage 3 G82.20 Paraplegia, unspecified E10.621 Type 1 diabetes mellitus with foot ulcer I50.42 Chronic  combined systolic (congestive) and diastolic (congestive) heart failure Quantity: 1 Electronic Signature(s) Signed: 08/23/2023  referral for that was back in October but again he never saw anyone.  We did have some conversation in that regard today. 04-26-2023 upon evaluation today patient appears to be doing really about the same in regard to his wound. He did see the plastic surgeon and apparently the recommendation was for several things including a new bed, new cushion, some physical therapy to help with transfers and upper body strengthening, and subsequently they also recommended that he should be seen at least twice a month for debridements. Again in the past we have not really done much in the way of debridements due to the fact that he really has not had much to debride to be perfectly honest. The patient is in agreement with plan he states he really does not see any need to come twice a month for that he wants [name] to continue his monthly visits. 05-25-2023 upon evaluation today patient appears to be doing about the same in regard to his wound is actually measuring a little bit smaller however which is good news. Fortunately I do not see any signs of active infection locally or systemically which is great news and in general I think that we are moving in the right direction. 7/22. Difficult wound just below his coccyx. He has been using silver alginate as the primary dressing. He comes here for monthly follow-up. He apparently sees plastic surgery at the wound care center at [facility] as well. He has been for an initial consultation there 07-26-2023 upon evaluation today patient appears to be doing poorly currently in regard to his wound. He has been tolerating the dressing changes but unfortunately has a lot more irritation and excoriation at this point. He does have a new chair and a new cushion though he tells me the center part of the cushion he took out because it was hurting him. With that being said I am not certain that that was necessarily the best movement that seems like this may have caused some irritation. He tells me however it was extremely uncomfortable. I explained that I  think he needs to get in touch with new motion and see what they can figure out here for him. 08-23-2023 upon evaluation today patient actually appears to be doing the best he has for quite some time. Fortunately there does not appear to be any signs of active infection at this time as far as a bacterial infection is concerned he has T me that he has been trying to stay off of this is much as he could. old Fortunately I do not see any evidence of infection overall systemically either which is good news. This is actually the best change of seen in his wound from visit to visit since I have been taking care of him. He tells me has been spending about 16 hours a day off of this. Electronic Signature(s) Signed: 08/23/2023 3:31:57 PM By: Allen Derry PA-C Entered By: Allen Derry on 08/23/2023 12:31:57 Neighbors, Hillery Hunter ([id number]) [id number] Page 4 of 10 -------------------------------------------------------------------------------- Physical Exam Details Patient Name: Date of Service: LAWENCE, JERVIS 08/23/2023 3:15 PM Medical Record Number: [id number] Patient Account Number: [id number] Date of Birth/Sex: Treating RN: 1971-05-19 (52 y.o. [name] Primary Care Provider: Dale Hatfield Other Clinician: Referring Provider: Treating Provider/Extender: Antony Blackbird Weeks in Treatment: 74 Constitutional Well-nourished and well-hydrated in no acute distress. Respiratory normal breathing without difficulty. Psychiatric this patient is able to make decisions and demonstrates good insight into disease process. Alert and  Oriented x 3. pleasant and cooperative. Notes Upon inspection patient's wound is dramatically improved from a size perspective the only issue I see is he does have some issue with what appears to be more of a fungal infection I do think he may benefit from a oral antifungal due to the fact that with the location and having to do this  dressing himself is not to be able to use any powders around this region and it is in the immediate periwound where he is having put the dressing on. Otherwise the wound itself looks to be doing so much better. Electronic Signature(s) Signed: 08/23/2023 3:32:25 PM By: Allen Derry PA-C Entered By: Allen Derry on 08/23/2023 12:32:25 -------------------------------------------------------------------------------- Physician Orders Details Patient Name: Date of Service: [name] 08/23/2023 3:15 PM Medical Record Number: [id number] Patient Account Number: [id number] Date of Birth/Sex: Treating RN: 03/27/1971 (52 y.o. [name]) [name] Primary Care Provider: Dale [facility] Other Clinician: Referring Provider: Treating Provider/Extender: Antony Blackbird Weeks in Treatment: 58 Verbal / Phone Orders: No Diagnosis Coding ICD-10 Coding Code Description L89.153 Pressure ulcer of sacral region, stage 3 G82.20 Paraplegia, unspecified E10.621 Type 1 diabetes mellitus with foot ulcer I50.42 Chronic combined systolic (congestive) and diastolic (congestive) heart failure Follow-up Appointments NAZIRE, CHISMAN ([id number]) [id number] Page 5 of 10 ppointment in 1 month - Patient request. Return A Bathing/ Shower/ Hygiene May shower; gently cleanse wound with antibacterial soap, rinse and pat dry prior to dressing wounds Wound Treatment Wound #8 - Sacrum Wound Laterality: Midline Cleanser: Byram Ancillary Kit - 15 Day Supply (Generic) 1 x Per Day/30 Days Discharge Instructions: Use supplies as instructed; Kit contains: (15) Saline Bullets; (15) 3x3 Gauze; 15 pr Gloves Cleanser: Soap and Water 1 x Per Day/30 Days Discharge Instructions: Gently cleanse wound with antibacterial soap, rinse and pat dry prior to dressing wounds Prim Dressing: Silvercel Small 2x2 (in/in) (DME) (Generic) 1 x Per Day/30 Days ary Discharge Instructions: Apply Silvercel Small 2x2  (in/in) as instructed Secondary Dressing: (BORDER) Zetuvit Plus SILICONE BORDER Dressing 4x4 (in/in) (DME) (Generic) 1 x Per Day/30 Days Patient Medications llergies: Sulfa (Sulfonamide Antibiotics), Iodinated Contrast Media - IV Dye, [place]erfed (pseudoephedrine) A Notifications Medication Indication Start End 08/23/2023 fluconazole DOSE 1 - oral 150 mg tablet - 1 tablet oral take 1 tab by mouth per week x 4 weeks Electronic Signature(s) Signed: 08/23/2023 3:34:04 PM By: Allen Derry PA-C Entered By: Allen Derry on 08/23/2023 12:34:04 -------------------------------------------------------------------------------- Problem List Details Patient Name: Date of Service: [name] 08/23/2023 3:15 PM Medical Record Number: [id number] Patient Account Number: [id number] Date of Birth/Sex: Treating RN: 07/18/1971 (52 y.o. [name] Primary Care Provider: Dale Boyds Other Clinician: Referring Provider: Treating Provider/Extender: Antony Blackbird Weeks in Treatment: 66 Active Problems ICD-10 Encounter Code Description Active Date MDM Diagnosis L89.153 Pressure ulcer of sacral region, stage 3 11/27/2021 No Yes G82.20 Paraplegia, unspecified 11/27/2021 No Yes E10.621 Type 1 diabetes mellitus with foot ulcer 11/27/2021 No Yes I50.42 Chronic combined systolic (congestive) and diastolic (congestive) heart failure 11/27/2021 No Yes Pargas, Hillery Hunter ([id number]) [id number] Page 6 of 10 Inactive Problems Resolved Problems Electronic Signature(s) Signed: 08/23/2023 3:25:46 PM By: Allen Derry PA-C Entered By: Allen Derry on 08/23/2023 12:25:46 -------------------------------------------------------------------------------- Progress Note Details Patient Name: Date of Service: [name] 08/23/2023 3:15 PM Medical Record Number: [id number] Patient Account Number: [id number] Date of Birth/Sex: Treating RN: 15-Sep-1971 (52 y.o. [name] Primary Care Provider: Dale [place] Other Clinician: Referring Provider: Treating

## 2023-08-24 DIAGNOSIS — S31809A Unspecified open wound of unspecified buttock, initial encounter: Secondary | ICD-10-CM | POA: Diagnosis not present

## 2023-08-24 DIAGNOSIS — I5042 Chronic combined systolic (congestive) and diastolic (congestive) heart failure: Secondary | ICD-10-CM | POA: Diagnosis not present

## 2023-08-24 DIAGNOSIS — E10621 Type 1 diabetes mellitus with foot ulcer: Secondary | ICD-10-CM | POA: Diagnosis not present

## 2023-08-24 DIAGNOSIS — L89153 Pressure ulcer of sacral region, stage 3: Secondary | ICD-10-CM | POA: Diagnosis not present

## 2023-08-26 NOTE — Progress Notes (Signed)
PLES, FRAGER ([id number]) [id number] Page 1 of 9 Visit Report for 08/23/2023 Arrival Information Details Patient Name: Date of Service: [name], [name]. 08/23/2023 3:15 PM Medical Record Number: [id number] Patient Account Number: [id number] Date of Birth/Sex: Treating RN: Apr 14, 1971 (52 y.o. [name]) [name] Primary Care Kersti Scavone: [place] Other Clinician: Referring Taneshia Lorence: Treating Fleur Audino/Extender: Antony Blackbird Weeks in Treatment: 48 Visit Information History Since Last Visit Added or deleted any medications: No Patient Arrived: Wheel Chair Any new allergies or adverse reactions: No Arrival Time: 15:19 Had a fall or experienced change in No Accompanied By: self activities of daily living that may affect Transfer Assistance: None risk of falls: Patient Identification Verified: Yes Signs or symptoms of abuse/neglect since last visito No Secondary Verification Process Completed: Yes Hospitalized since last visit: No Patient Requires Transmission-Based Precautions: No Implantable device outside of the clinic excluding No Patient Has Alerts: No cellular tissue based products placed in the center since last visit: Has Dressing in Place as Prescribed: Yes Pain Present Now: No Electronic Signature(s) Signed: 08/26/2023 1:18:15 PM By: [name] RN Entered By: [name] on 08/23/2023 15:19:42 -------------------------------------------------------------------------------- Clinic Level of Care Assessment Details Patient Name: Date of Service: [name], [name] 08/23/2023 3:15 PM Medical Record Number: [id number] Patient Account Number: [id number] Date of Birth/Sex: Treating RN: Jan 31, 1971 (52 y.o. [name]) [name] Primary Care Hieu Herms: [place] Other Clinician: Referring Falana Clagg: Treating Rusti Arizmendi/Extender: Antony Blackbird Weeks in Treatment: 90 Clinic Level of Care Assessment Items TOOL 4 Quantity  Score X- 1 0 Use when only an EandM is performed on FOLLOW-UP visit ASSESSMENTS - Nursing Assessment / Reassessment X- 1 10 Reassessment of Co-morbidities (includes updates in patient status) X- 1 5 Reassessment of Adherence to Treatment Plan [name], [name] ([id number]) [id number] Page 2 of 9 ASSESSMENTS - Wound and Skin A ssessment / Reassessment X - Simple Wound Assessment / Reassessment - one wound 1 5 []  - 0 Complex Wound Assessment / Reassessment - multiple wounds []  - 0 Dermatologic / Skin Assessment (not related to wound area) ASSESSMENTS - Focused Assessment []  - 0 Circumferential Edema Measurements - multi extremities []  - 0 Nutritional Assessment / Counseling / Intervention []  - 0 Lower Extremity Assessment (monofilament, tuning fork, pulses) []  - 0 Peripheral Arterial Disease Assessment (using hand held doppler) ASSESSMENTS - Ostomy and/or Continence Assessment and Care []  - 0 Incontinence Assessment and Management []  - 0 Ostomy Care Assessment and Management (repouching, etc.) PROCESS - Coordination of Care X - Simple Patient / Family Education for ongoing care 1 15 []  - 0 Complex (extensive) Patient / Family Education for ongoing care []  - 0 Staff obtains Chiropractor, Records, T Results / Process Orders est []  - 0 Staff telephones HHA, Nursing Homes / Clarify orders / etc []  - 0 Routine Transfer to another Facility (non-emergent condition) []  - 0 Routine Hospital Admission (non-emergent condition) []  - 0 New Admissions / Manufacturing engineer / Ordering NPWT Apligraf, etc. , []  - 0 Emergency Hospital Admission (emergent condition) X- 1 10 Simple Discharge Coordination []  - 0 Complex (extensive) Discharge Coordination PROCESS - Special Needs []  - 0 Pediatric / Minor Patient Management []  - 0 Isolation Patient Management []  - 0 Hearing / Language / Visual special needs []  - 0 Assessment of Community assistance  (transportation, D/C planning, etc.) []  - 0 Additional assistance / Altered mentation []  - 0 Support Surface(s) Assessment (bed, cushion, seat, etc.) INTERVENTIONS - Wound Cleansing / Measurement X - Simple Wound Cleansing -  in Volume: Category/Stage III N/A N/A Classification: Medium N/A N/A Exudate A mount: Serosanguineous N/A N/A Exudate Type: red, brown N/A N/A Exudate Color: Thickened N/A N/A Wound Margin: Large (67-100%) N/A N/A Granulation A mount: Red N/A N/A Granulation Quality: None Present (0%) N/A N/A Necrotic A mount: Fat Layer (Subcutaneous Tissue): Yes N/A N/A Exposed Structures: Fascia: No Tendon: No SIR, ROTZ ([id number]) [id number] Page 5 of 9 Muscle: No Joint: No Bone: No None N/A N/A Epithelialization: Treatment Notes Electronic Signature(s) Signed: 08/26/2023 1:18:15 PM By: [name] RN Entered By: [name] on 08/23/2023 15:25:25 -------------------------------------------------------------------------------- Multi-Disciplinary Care Plan Details Patient Name: Date of Service: [name] Lean EN K. 08/23/2023 3:15 PM Medical Record Number: [id number] Patient Account Number: [id number] Date of Birth/Sex: Treating RN: 1970-12-20 (52 y.o. [name] Primary Care Braylei Totino: Dale [place] Other Clinician: Referring Charle Clear: Treating Starlin Steib/Extender: Antony Blackbird Weeks in Treatment: 90 Active Inactive Wound/Skin Impairment Nursing Diagnoses: Knowledge deficit related to ulceration/compromised skin integrity Goals: Patient/caregiver will verbalize understanding of skin care  regimen Date Initiated: 11/27/2021 Target Resolution Date: 09/28/2022 Goal Status: Active Ulcer/skin breakdown will have a volume reduction of 30% by week 4 Date Initiated: 11/27/2021 Date Inactivated: 03/29/2023 Target Resolution Date: 01/28/2022 Unmet Reason: comorbidities/non Goal Status: Unmet compliance Ulcer/skin breakdown will have a volume reduction of 50% by week 8 Date Initiated: 11/27/2021 Date Inactivated: 03/29/2023 Target Resolution Date: 02/25/2022 Unmet Reason: comorbidities/non Goal Status: Unmet compliance Ulcer/skin breakdown will have a volume reduction of 80% by week 12 Date Initiated: 11/27/2021 Date Inactivated: 03/29/2023 Target Resolution Date: 03/28/2022 Unmet Reason: comorbidities/non Goal Status: Unmet compliance Ulcer/skin breakdown will heal within 14 weeks Date Initiated: 11/27/2021 Date Inactivated: 05/25/2023 Target Resolution Date: 04/27/2022 Unmet Reason: comorbidities/non Goal Status: Unmet compliance Interventions: Assess patient/caregiver ability to obtain necessary supplies Assess patient/caregiver ability to perform ulcer/skin care regimen upon admission and as needed Assess ulceration(s) every visit Notes: Electronic Signature(s) Signed: 08/26/2023 1:18:15 PM By: [name] RN Entered By: [name] on 08/23/2023 15:25:45 [name], [name] ([id number]) [id number] Page 6 of 9 -------------------------------------------------------------------------------- Pain Assessment Details Patient Name: Date of Service: [name], [name] 08/23/2023 3:15 PM Medical Record Number: [id number] Patient Account Number: [id number] Date of Birth/Sex: Treating RN: Feb 07, 1971 (52 y.o. [name]) [name] Primary Care Khyron Garno: Dale [place] Other Clinician: Referring Kiyra Slaubaugh: Treating Joel Cowin/Extender: Antony Blackbird Weeks in Treatment: 57 Active Problems Location of Pain Severity and Description of Pain Patient  Has Paino No Site Locations Pain Management and Medication Current Pain Management: Electronic Signature(s) Signed: 08/26/2023 1:18:15 PM By: [name] RN Entered By: [name] on 08/23/2023 15:20:09 -------------------------------------------------------------------------------- Patient/Caregiver Education Details Patient Name: Date of Service: [name] 9/16/2024andnbsp3:15 PM Medical Record Number: [id number] Patient Account Number: [id number] Date of Birth/Gender: Treating RN: 25-Feb-1971 (52 y.o. [name] Primary Care Physician: [place] Other Clinician: Referring Physician: Treating Physician/Extender: Antony Blackbird Weeks in Treatment: [address], [place] K ([id number]) [id number] Page 7 of 9 Education Assessment Education Provided To: Patient Education Topics Provided Wound/Skin Impairment: Handouts: Caring for Your Ulcer Methods: Explain/Verbal Responses: State content correctly Electronic Signature(s) Signed: 08/26/2023 1:18:15 PM By: [name] RN Entered By: [name] on 08/23/2023 15:26:07 -------------------------------------------------------------------------------- Wound Assessment Details Patient Name: Date of Service: [name]. 08/23/2023 3:15 PM Medical Record Number: [id number] Patient Account Number: [id number] Date of Birth/Sex: Treating RN: 06/25/1971 (52 y.o. [name] Primary Care Jenevieve Kirschbaum: Dale [place] Other Clinician: Referring Faruq Rosenberger: Treating Romell Wolden/Extender: Antony Blackbird Weeks in Treatment:  90 Wound Status Wound Number: 8 Primary Pressure Ulcer Etiology: Wound Location: Midline Sacrum Wound Open Wounding Event: Gradually Appeared Status: Date Acquired: 05/07/2021 Comorbid Congestive Heart Failure, Type I Diabetes, History of pressure Weeks Of Treatment: 90 History: wounds, Paraplegia Clustered Wound: No Photos Wound  Measurements Length: (cm) 2 Width: (cm) 2 Depth: (cm) 0.3 Area: (cm) 3.142 Volume: (cm) 0.942 % Reduction in Area: 42.9% % Reduction in Volume: 57.2% Epithelialization: None Tunneling: No Undermining: No Wound Description Classification: Category/Stage III Wound Margin: Thickened [name], [name] ([id number]) Exudate Amount: Medium Exudate Type: Serosanguineous Exudate Color: red, brown Foul Odor After Cleansing: No Slough/Fibrino No [id number] Page 8 of 9 Wound Bed Granulation Amount: Large (67-100%) Exposed Structure Granulation Quality: Red Fascia Exposed: No Necrotic Amount: None Present (0%) Fat Layer (Subcutaneous Tissue) Exposed: Yes Tendon Exposed: No Muscle Exposed: No Joint Exposed: No Bone Exposed: No Treatment Notes Wound #8 (Sacrum) Wound Laterality: Midline Cleanser Byram Ancillary Kit - 15 Day Supply Discharge Instruction: Use supplies as instructed; Kit contains: (15) Saline Bullets; (15) 3x3 Gauze; 15 pr Gloves Soap and Water Discharge Instruction: Gently cleanse wound with antibacterial soap, rinse and pat dry prior to dressing wounds Peri-Wound Care Topical Primary Dressing Silvercel Small 2x2 (in/in) Discharge Instruction: Apply Silvercel Small 2x2 (in/in) as instructed Secondary Dressing (BORDER) Zetuvit Plus SILICONE BORDER Dressing 4x4 (in/in) Secured With Compression Wrap Compression Stockings Add-Ons Electronic Signature(s) Signed: 08/26/2023 1:18:15 PM By: [name] RN Entered By: [name] on 08/23/2023 15:25:07 -------------------------------------------------------------------------------- Vitals Details Patient Name: Date of Service: [name] Lean EN K. 08/23/2023 3:15 PM Medical Record Number: [id number] Patient Account Number: [id number] Date of Birth/Sex: Treating RN: 1971-10-26 (52 y.o. [name]) [name] Primary Care Jaleel Allen: Dale [place] Other Clinician: Referring Angelyna Henderson: Treating  Virl Coble/Extender: Antony Blackbird Weeks in Treatment: 90 Vital Signs Time Taken: 15:15 Temperature (F): 98.2 Height (in): 72 Pulse (bpm): 53 Weight (lbs): 175 Respiratory Rate (breaths/min): 18 Body Mass Index (BMI): 23.7 Blood Pressure (mmHg): 168/42 Reference Range: 80 - 120 mg / dl [name], [name] ([id number]) [id number] Page 9 of 9 Electronic Signature(s) Signed: 08/26/2023 1:18:15 PM By: [name] RN Entered By: [name] on 08/23/2023 15:20:01  90 Wound Status Wound Number: 8 Primary Pressure Ulcer Etiology: Wound Location: Midline Sacrum Wound Open Wounding Event: Gradually Appeared Status: Date Acquired: 05/07/2021 Comorbid Congestive Heart Failure, Type I Diabetes, History of pressure Weeks Of Treatment: 90 History: wounds, Paraplegia Clustered Wound: No Photos Wound  Measurements Length: (cm) 2 Width: (cm) 2 Depth: (cm) 0.3 Area: (cm) 3.142 Volume: (cm) 0.942 % Reduction in Area: 42.9% % Reduction in Volume: 57.2% Epithelialization: None Tunneling: No Undermining: No Wound Description Classification: Category/Stage III Wound Margin: Thickened [name], [name] ([id number]) Exudate Amount: Medium Exudate Type: Serosanguineous Exudate Color: red, brown Foul Odor After Cleansing: No Slough/Fibrino No [id number] Page 8 of 9 Wound Bed Granulation Amount: Large (67-100%) Exposed Structure Granulation Quality: Red Fascia Exposed: No Necrotic Amount: None Present (0%) Fat Layer (Subcutaneous Tissue) Exposed: Yes Tendon Exposed: No Muscle Exposed: No Joint Exposed: No Bone Exposed: No Treatment Notes Wound #8 (Sacrum) Wound Laterality: Midline Cleanser Byram Ancillary Kit - 15 Day Supply Discharge Instruction: Use supplies as instructed; Kit contains: (15) Saline Bullets; (15) 3x3 Gauze; 15 pr Gloves Soap and Water Discharge Instruction: Gently cleanse wound with antibacterial soap, rinse and pat dry prior to dressing wounds Peri-Wound Care Topical Primary Dressing Silvercel Small 2x2 (in/in) Discharge Instruction: Apply Silvercel Small 2x2 (in/in) as instructed Secondary Dressing (BORDER) Zetuvit Plus SILICONE BORDER Dressing 4x4 (in/in) Secured With Compression Wrap Compression Stockings Add-Ons Electronic Signature(s) Signed: 08/26/2023 1:18:15 PM By: [name] RN Entered By: [name] on 08/23/2023 15:25:07 -------------------------------------------------------------------------------- Vitals Details Patient Name: Date of Service: [name] Lean EN K. 08/23/2023 3:15 PM Medical Record Number: [id number] Patient Account Number: [id number] Date of Birth/Sex: Treating RN: 1971-10-26 (52 y.o. [name]) [name] Primary Care Jaleel Allen: [place] Other Clinician: Referring Angelyna Henderson: Treating  Virl Coble/Extender: Antony Blackbird Weeks in Treatment: 90 Vital Signs Time Taken: 15:15 Temperature (F): 98.2 Height (in): 72 Pulse (bpm): 53 Weight (lbs): 175 Respiratory Rate (breaths/min): 18 Body Mass Index (BMI): 23.7 Blood Pressure (mmHg): 168/42 Reference Range: 80 - 120 mg / dl [name], [name] ([id number]) [id number] Page 9 of 9 Electronic Signature(s) Signed: 08/26/2023 1:18:15 PM By: [name] RN Entered By: [name] on 08/23/2023 15:20:01

## 2023-08-27 ENCOUNTER — Other Ambulatory Visit: Payer: Self-pay | Admitting: Internal Medicine

## 2023-08-27 DIAGNOSIS — Z79899 Other long term (current) drug therapy: Secondary | ICD-10-CM | POA: Diagnosis not present

## 2023-08-28 LAB — CBC
Hematocrit: 34.3 % — ABNORMAL LOW (ref 37.5–51.0)
Hemoglobin: 11.5 g/dL — ABNORMAL LOW (ref 13.0–17.7)
MCH: 32.2 pg (ref 26.6–33.0)
MCHC: 33.5 g/dL (ref 31.5–35.7)
MCV: 96 fL (ref 79–97)
Platelets: 166 10*3/uL (ref 150–450)
RBC: 3.57 x10E6/uL — ABNORMAL LOW (ref 4.14–5.80)
RDW: 11.6 % (ref 11.6–15.4)
WBC: 5.6 10*3/uL (ref 3.4–10.8)

## 2023-08-28 LAB — BASIC METABOLIC PANEL
BUN/Creatinine Ratio: 23 — ABNORMAL HIGH (ref 9–20)
BUN: 15 mg/dL (ref 6–24)
CO2: 24 mmol/L (ref 20–29)
Calcium: 8.9 mg/dL (ref 8.7–10.2)
Chloride: 92 mmol/L — ABNORMAL LOW (ref 96–106)
Creatinine, Ser: 0.66 mg/dL — ABNORMAL LOW (ref 0.76–1.27)
Glucose: 108 mg/dL — ABNORMAL HIGH (ref 70–99)
Potassium: 5.1 mmol/L (ref 3.5–5.2)
Sodium: 128 mmol/L — ABNORMAL LOW (ref 134–144)
eGFR: 112 mL/min/{1.73_m2} (ref >59)

## 2023-08-28 LAB — LIPID PANEL
Chol/HDL Ratio: 1.8 ratio (ref 0.0–5.0)
Cholesterol, Total: 149 mg/dL (ref 100–199)
HDL: 81 mg/dL (ref >39)
LDL Chol Calc (NIH): 58 mg/dL (ref 0–99)
Triglycerides: 45 mg/dL (ref 0–149)
VLDL Cholesterol Cal: 10 mg/dL (ref 5–40)

## 2023-08-30 NOTE — Progress Notes (Signed)
No concerns for bleeding noted as blood counts have remained stable. Kidney function is stable. Sodium continues to drop. Limit free water intake. Please forward labs to PCP as well to assist with hyponatremia.

## 2023-08-31 ENCOUNTER — Telehealth: Payer: Self-pay | Admitting: Internal Medicine

## 2023-08-31 NOTE — Telephone Encounter (Signed)
I received labs from cardiology. Cholesterol levels are ok.  Hgb stable. Still decreased, but stable. Kidney function wnl.  Sodium low.  He has had an issue with low sodium.  Has seen nephrology previously.  He needs to limit free water intake.  Needs to limit alcohol intake.  Make sure eating regular meals.  Would like to recheck sodium in 7-10 days

## 2023-09-03 ENCOUNTER — Other Ambulatory Visit: Payer: Self-pay | Admitting: *Deleted

## 2023-09-03 ENCOUNTER — Encounter: Payer: Self-pay | Admitting: *Deleted

## 2023-09-03 DIAGNOSIS — E871 Hypo-osmolality and hyponatremia: Secondary | ICD-10-CM

## 2023-09-10 NOTE — Telephone Encounter (Signed)
Read by patient on 9/27

## 2023-09-12 ENCOUNTER — Other Ambulatory Visit: Payer: Self-pay | Admitting: Internal Medicine

## 2023-09-13 ENCOUNTER — Other Ambulatory Visit: Payer: Medicare HMO

## 2023-09-15 ENCOUNTER — Encounter: Payer: Self-pay | Admitting: Internal Medicine

## 2023-09-15 ENCOUNTER — Other Ambulatory Visit (INDEPENDENT_AMBULATORY_CARE_PROVIDER_SITE_OTHER): Payer: Medicare Other

## 2023-09-15 DIAGNOSIS — E871 Hypo-osmolality and hyponatremia: Secondary | ICD-10-CM | POA: Diagnosis not present

## 2023-09-16 ENCOUNTER — Other Ambulatory Visit: Payer: Medicare HMO

## 2023-09-16 LAB — SODIUM: Sodium: 135 meq/L (ref 135–145)

## 2023-09-21 DIAGNOSIS — Z9641 Presence of insulin pump (external) (internal): Secondary | ICD-10-CM | POA: Diagnosis not present

## 2023-09-21 DIAGNOSIS — E109 Type 1 diabetes mellitus without complications: Secondary | ICD-10-CM | POA: Diagnosis not present

## 2023-09-22 ENCOUNTER — Encounter: Payer: Medicare Other | Attending: Internal Medicine | Admitting: Internal Medicine

## 2023-09-22 DIAGNOSIS — G8220 Paraplegia, unspecified: Secondary | ICD-10-CM | POA: Diagnosis not present

## 2023-09-22 DIAGNOSIS — I110 Hypertensive heart disease with heart failure: Secondary | ICD-10-CM | POA: Diagnosis not present

## 2023-09-22 DIAGNOSIS — E109 Type 1 diabetes mellitus without complications: Secondary | ICD-10-CM | POA: Diagnosis not present

## 2023-09-22 DIAGNOSIS — Z5986 Financial insecurity: Secondary | ICD-10-CM | POA: Diagnosis not present

## 2023-09-22 DIAGNOSIS — X58XXXA Exposure to other specified factors, initial encounter: Secondary | ICD-10-CM | POA: Insufficient documentation

## 2023-09-22 DIAGNOSIS — I5042 Chronic combined systolic (congestive) and diastolic (congestive) heart failure: Secondary | ICD-10-CM | POA: Diagnosis not present

## 2023-09-22 DIAGNOSIS — E10621 Type 1 diabetes mellitus with foot ulcer: Secondary | ICD-10-CM | POA: Diagnosis not present

## 2023-09-22 DIAGNOSIS — G8254 Quadriplegia, C5-C7 incomplete: Secondary | ICD-10-CM | POA: Diagnosis not present

## 2023-09-22 DIAGNOSIS — L89153 Pressure ulcer of sacral region, stage 3: Secondary | ICD-10-CM | POA: Diagnosis not present

## 2023-09-22 DIAGNOSIS — T8189XA Other complications of procedures, not elsewhere classified, initial encounter: Secondary | ICD-10-CM | POA: Diagnosis not present

## 2023-09-22 DIAGNOSIS — G8929 Other chronic pain: Secondary | ICD-10-CM | POA: Insufficient documentation

## 2023-09-23 ENCOUNTER — Ambulatory Visit
Admission: EM | Admit: 2023-09-23 | Discharge: 2023-09-23 | Disposition: A | Discharge status: Home / Self Care | Payer: Medicare Other | Attending: Physician Assistant | Admitting: Physician Assistant

## 2023-09-23 DIAGNOSIS — N319 Neuromuscular dysfunction of bladder, unspecified: Secondary | ICD-10-CM | POA: Insufficient documentation

## 2023-09-23 DIAGNOSIS — R8271 Bacteriuria: Secondary | ICD-10-CM | POA: Diagnosis not present

## 2023-09-23 DIAGNOSIS — R300 Dysuria: Secondary | ICD-10-CM | POA: Diagnosis not present

## 2023-09-23 LAB — URINALYSIS, W/ REFLEX TO CULTURE (INFECTION SUSPECTED)
Bilirubin Urine: NEGATIVE
Glucose, UA: NEGATIVE mg/dL
Hgb urine dipstick: NEGATIVE
Ketones, ur: NEGATIVE mg/dL
Nitrite: NEGATIVE
Protein, ur: 30 mg/dL — AB
Specific Gravity, Urine: 1.025 (ref 1.005–1.030)
WBC, UA: >50 WBC/hpf (ref 0–5)
pH: 6 (ref 5.0–8.0)

## 2023-09-23 MED ORDER — CIPROFLOXACIN HCL 500 MG PO TABS
500.0000 mg | ORAL_TABLET | Freq: Two times a day (BID) | ORAL | 0 refills | Status: AC
Start: 2023-09-23 — End: 2023-10-03
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
## 2023-09-23 NOTE — Discharge Instructions (Signed)
Recommend start Cipro 500mg  twice a day for at least 7 days. Continue to push fluids. Recommend contact your Urologist tomorrow to discuss continued treatment and follow-up.

## 2023-09-23 NOTE — ED Triage Notes (Signed)
UTI sx for 2 days. Painful urination.

## 2023-09-24 LAB — URINE CULTURE

## 2023-09-24 NOTE — Progress Notes (Signed)
Account Number: [id number] Date of Birth/Sex: Treating [name]: Oct 23, 1971 (52 y.o. 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 Wound Open Wounding Event: Gradually Appeared Status: Date Acquired: 05/07/2021 Comorbid Congestive Heart Failure, Type I Diabetes, History  of pressure [name] Of Treatment: 94 History: wounds, Paraplegia Clustered Wound: No Photos Wound Measurements Length: (cm) 1 Width: (cm) 1.5 Depth: (cm) 0.3 Area: (cm) 1.178 Volume: (cm) 0.353 % Reduction in Area: 78.6% % Reduction in Volume: 83.9% Epithelialization: None Tunneling: No Undermining: No Wound Description Classification: Category/Stage III Wound Margin: Thickened Schuff, TIMOFEY ESSARY ([id number]) Exudate Amount: Medium Exudate Type: Serosanguineous Exudate Color: red, brown Foul Odor After Cleansing: No Slough/Fibrino No [id number].pdf Page 8 of 9 Wound Bed Granulation Amount: Large (67-100%) Exposed Structure Granulation Quality: Red Fascia Exposed: No Necrotic Amount: None Present (0%) Fat Layer (Subcutaneous Tissue) Exposed: Yes Tendon Exposed: No Muscle Exposed: No Joint Exposed: No Bone Exposed: No Treatment Notes Wound #8 (Sacrum) Wound Laterality: Midline Cleanser Byram Ancillary Kit - 15 Day Supply Discharge Instruction: Use supplies as instructed; Kit contains: (15) Saline Bullets; (15) 3x3 Gauze; 15 pr Gloves Soap and Water Discharge Instruction: Gently cleanse wound with antibacterial soap, rinse and pat dry prior to dressing wounds Peri-Wound Care Topical Primary Dressing Silvercel Small 2x2 (in/in) Discharge Instruction: Apply Silvercel Small 2x2 (in/in) as instructed Secondary Dressing (BORDER) Zetuvit Plus SILICONE BORDER Dressing 4x4 (in/in) Secured With Compression Wrap Compression Stockings Add-Ons Electronic Signature(s) Signed: 09/24/2023 11:41:18 AM By: [name] Entered By: [name] on 09/22/2023 12:10:24 -------------------------------------------------------------------------------- Vitals Details Patient Name: Date of Service: [name] Lean EN [name]. 09/22/2023 3:00 PM Medical Record Number: [id number] Patient Account Number: [id number] Date of Birth/Sex: Treating [name]: [birth year] (52 y.o. [name]) [name] Primary Care [name]: [name] Other Clinician: Referring [name]: Treating [name]/Extender: RO BSO N, [name] [name], [name] in Treatment: 94 Vital Signs Time Taken: 15:05 Temperature (F): 97.6 Height (in): 72 Pulse (bpm): 52 Weight (lbs): 175 Respiratory Rate (breaths/min): 18 Body Mass Index (BMI): 23.7 Blood Pressure (mmHg): 170/84 Reference Range: 80 - 120 mg / dl [name], [name] ([id number]) [id number].pdf Page 9 of 9 Electronic Signature(s) Signed: 09/24/2023 11:41:18 AM By: [name] Entered By: [name] on 09/22/2023 12:06:13  Cleansing / Measurement X - Simple Wound Cleansing - one wound 1 5 []  - 0 Complex Wound Cleansing - multiple wounds X- 1 5 Wound Imaging (photographs - any number of wounds) []  - 0 Wound Tracing (instead of photographs) X- 1 5 Simple Wound Measurement - one wound []  - 0 Complex Wound Measurement - multiple wounds INTERVENTIONS - Wound Dressings X - Small Wound Dressing one or multiple wounds 1 10 []  - 0 Medium Wound Dressing one or multiple wounds []  - 0 Large Wound Dressing one or multiple wounds []  - 0 Application of Medications - topical []  - 0 Application of Medications - injection INTERVENTIONS - Miscellaneous []  - 0 External ear exam [name], [name] ([id number]) [phone].pdf Page 3 of 9 []  - 0 Specimen Collection (cultures, biopsies, blood, body fluids, etc.) []  - 0 Specimen(s) / Culture(s) sent or taken to Lab for analysis []  - 0 Patient Transfer (multiple staff / Michiel Sites Lift / Similar devices) []  - 0 Simple Staple / Suture removal (25 or less) []  - 0 Complex Staple / Suture removal (26 or more) []  - 0 Hypo / Hyperglycemic Management (close monitor of Blood Glucose) []  - 0 Ankle / Brachial Index (ABI) - do not check if billed separately X- 1 5 Vital Signs Has the patient been seen at the hospital within the last three years: Yes Total Score: 75 Level Of Care: New/Established - Level 2 Electronic Signature(s) Signed: 09/24/2023 11:41:18 AM By: [name] Entered By: [name] on 09/22/2023 13:08:24 -------------------------------------------------------------------------------- Encounter Discharge Information Details Patient Name: Date of Service: [name] Lean EN [name]. 09/22/2023 3:00 PM Medical Record Number: [id number] Patient Account  Number: [id number] Date of Birth/Sex: Treating [name]: [birth year] (52 y.o. [name] Primary Care [name]: [name] Other Clinician: Referring [name]: Treating [name]/Extender: RO BSO N, [name] [name], [name] in Treatment: 94 Encounter Discharge Information Items Discharge Condition: Stable Ambulatory Status: Wheelchair Discharge Destination: Home Transportation: Private Auto Accompanied By: Self Schedule Follow-up Appointment: Yes Clinical Summary of Care: Electronic Signature(s) Signed: 09/24/2023 11:41:18 AM By: [name] Entered By: [name] on 09/22/2023 13:09:32 -------------------------------------------------------------------------------- Lower Extremity Assessment Details Patient Name: Date of Service: [name] Lean EN [name]. 09/22/2023 3:00 PM [name], [name] ([id number]) [id number] Page 4 of 9 Medical Record Number: [id number] Patient Account Number: [id number] Date of Birth/Sex: Treating [name]: October 28, 1971 (52 y.o. [name]) [name] Primary Care [name]: [name] [place] Other Clinician: Referring [name]: Treating [name]/Extender: RO BSO N, [name] [name], [name] in Treatment: 94 Electronic Signature(s) Signed: 09/24/2023 11:41:18 AM By: [name] Entered By: [name] on 09/22/2023 12:12:18 -------------------------------------------------------------------------------- Multi Wound Chart Details Patient Name: Date of Service: [name] Lean EN [name]. 09/22/2023 3:00 PM Medical Record Number: [id number] Patient Account Number: [id number] Date of Birth/Sex: Treating [name]: 1971-04-10 (52 y.o. [name]) [name] Primary Care Dinita Migliaccio: [name] [place] Other Clinician: Referring Dominyk Law: Treating [place]/Extender: RO BSO N, [name] [name], [name] in Treatment: 94 Vital Signs Height(in): 72 Pulse(bpm): 52 Weight(lbs): 175 Blood Pressure(mmHg): 170/84 Body Mass Index(BMI):  23.7 Temperature(F): 97.6 Respiratory Rate(breaths/min): 18 [8:Photos:] [N/A:N/A] Midline Sacrum N/A N/A Wound Location: Gradually Appeared N/A N/A Wounding Event: Pressure Ulcer N/A N/A Primary Etiology: Congestive Heart Failure, Type I N/A N/A Comorbid History: Diabetes, History of pressure wounds, Paraplegia 05/07/2021 N/A N/A Date Acquired: 60 N/A N/A [name] of Treatment: Open N/A N/A Wound Status: No N/A N/A Wound Recurrence: 1x1.5x0.3 N/A N/A Measurements L x W x D (cm) 1.178 N/A N/A A (cm) :  [name], [name] ([id number]) [id number] Page 1 of 9 Visit Report for 09/22/2023 Arrival Information Details Patient Name: Date of Service: [name], BARDON. 09/22/2023 3:00 PM Medical Record Number: [id number] Patient Account Number: [id number] Date of Birth/Sex: Treating [name]: 04-26-1971 (52 y.o. [name]) [name] Primary Care Meili Kleckley: [name] [place] Other Clinician: Referring Arlene Genova: Treating Sha Amer/Extender: RO BSO N, [name] [name], [name] in Treatment: 94 Visit Information History Since Last Visit Added or deleted any medications: No Patient Arrived: Wheel Chair Any new allergies or adverse reactions: No Arrival Time: 15:05 Had a fall or experienced change in No Accompanied By: self activities of daily living that may affect Transfer Assistance: None risk of falls: Patient Identification Verified: Yes Signs or symptoms of abuse/neglect since last visito No Secondary Verification Process Completed: Yes Hospitalized since last visit: No Patient Requires Transmission-Based Precautions: No Implantable device outside of the clinic excluding No Patient Has Alerts: No cellular tissue based products placed in the center since last visit: Has Dressing in Place as Prescribed: Yes Pain Present Now: No Electronic Signature(s) Signed: 09/24/2023 11:41:18 AM By: [name] Entered By: [name] on 09/22/2023 12:05:37 -------------------------------------------------------------------------------- Clinic Level of Care Assessment Details Patient Name: Date of Service: [name], [name] 09/22/2023 3:00 PM Medical Record Number: [id number] Patient Account Number: [id number] Date of Birth/Sex: Treating [name]: 1971/11/30 (52 y.o. [name]) [name] Primary Care Keyon Winnick: [name] [place] Other Clinician: Referring Saunders [place]: Treating Elianah Karis/Extender: RO BSO N, [name] [name], [name] in Treatment: 94 Clinic Level of Care Assessment  Items TOOL 4 Quantity Score X- 1 0 Use when only an EandM is performed on FOLLOW-UP visit ASSESSMENTS - Nursing Assessment / Reassessment X- 1 10 Reassessment of Co-morbidities (includes updates in patient status) X- 1 5 Reassessment of Adherence to Treatment Plan ZYLON, [name] ([id number]) [phone].pdf Page 2 of 9 ASSESSMENTS - Wound and Skin A ssessment / Reassessment X - Simple Wound Assessment / Reassessment - one wound 1 5 []  - 0 Complex Wound Assessment / Reassessment - multiple wounds []  - 0 Dermatologic / Skin Assessment (not related to wound area) ASSESSMENTS - Focused Assessment []  - 0 Circumferential Edema Measurements - multi extremities []  - 0 Nutritional Assessment / Counseling / Intervention []  - 0 Lower Extremity Assessment (monofilament, tuning fork, pulses) []  - 0 Peripheral Arterial Disease Assessment (using hand held doppler) ASSESSMENTS - Ostomy and/or Continence Assessment and Care []  - 0 Incontinence Assessment and Management []  - 0 Ostomy Care Assessment and Management (repouching, etc.) PROCESS - Coordination of Care X - Simple Patient / Family Education for ongoing care 1 15 []  - 0 Complex (extensive) Patient / Family Education for ongoing care []  - 0 Staff obtains Chiropractor, Records, T Results / Process Orders est []  - 0 Staff telephones HHA, Nursing Homes / Clarify orders / etc []  - 0 Routine Transfer to another Facility (non-emergent condition) []  - 0 Routine Hospital Admission (non-emergent condition) []  - 0 New Admissions / Manufacturing engineer / Ordering NPWT Apligraf, etc. , []  - 0 Emergency Hospital Admission (emergent condition) X- 1 10 Simple Discharge Coordination []  - 0 Complex (extensive) Discharge Coordination PROCESS - Special Needs []  - 0 Pediatric / Minor Patient Management []  - 0 Isolation Patient Management []  - 0 Hearing / Language / Visual special needs []  - 0 Assessment of  Community assistance (transportation, D/C planning, etc.) []  - 0 Additional assistance / Altered mentation []  - 0 Support Surface(s) Assessment (bed, cushion, seat, etc.) INTERVENTIONS - Wound  Account Number: [id number] Date of Birth/Sex: Treating [name]: Oct 23, 1971 (52 y.o. 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 Wound Open Wounding Event: Gradually Appeared Status: Date Acquired: 05/07/2021 Comorbid Congestive Heart Failure, Type I Diabetes, History  of pressure [name] Of Treatment: 94 History: wounds, Paraplegia Clustered Wound: No Photos Wound Measurements Length: (cm) 1 Width: (cm) 1.5 Depth: (cm) 0.3 Area: (cm) 1.178 Volume: (cm) 0.353 % Reduction in Area: 78.6% % Reduction in Volume: 83.9% Epithelialization: None Tunneling: No Undermining: No Wound Description Classification: Category/Stage III Wound Margin: Thickened Schuff, TIMOFEY ESSARY ([id number]) Exudate Amount: Medium Exudate Type: Serosanguineous Exudate Color: red, brown Foul Odor After Cleansing: No Slough/Fibrino No [id number].pdf Page 8 of 9 Wound Bed Granulation Amount: Large (67-100%) Exposed Structure Granulation Quality: Red Fascia Exposed: No Necrotic Amount: None Present (0%) Fat Layer (Subcutaneous Tissue) Exposed: Yes Tendon Exposed: No Muscle Exposed: No Joint Exposed: No Bone Exposed: No Treatment Notes Wound #8 (Sacrum) Wound Laterality: Midline Cleanser Byram Ancillary Kit - 15 Day Supply Discharge Instruction: Use supplies as instructed; Kit contains: (15) Saline Bullets; (15) 3x3 Gauze; 15 pr Gloves Soap and Water Discharge Instruction: Gently cleanse wound with antibacterial soap, rinse and pat dry prior to dressing wounds Peri-Wound Care Topical Primary Dressing Silvercel Small 2x2 (in/in) Discharge Instruction: Apply Silvercel Small 2x2 (in/in) as instructed Secondary Dressing (BORDER) Zetuvit Plus SILICONE BORDER Dressing 4x4 (in/in) Secured With Compression Wrap Compression Stockings Add-Ons Electronic Signature(s) Signed: 09/24/2023 11:41:18 AM By: [name] Entered By: [name] on 09/22/2023 12:10:24 -------------------------------------------------------------------------------- Vitals Details Patient Name: Date of Service: [name] Lean EN [name]. 09/22/2023 3:00 PM Medical Record Number: [id number] Patient Account Number: [id number] Date of Birth/Sex: Treating [name]: [birth year] (52 y.o. [name]) [name] Primary Care [name]: [name] Other Clinician: Referring [name]: Treating [name]/Extender: RO BSO N, [name] [name], [name] in Treatment: 94 Vital Signs Time Taken: 15:05 Temperature (F): 97.6 Height (in): 72 Pulse (bpm): 52 Weight (lbs): 175 Respiratory Rate (breaths/min): 18 Body Mass Index (BMI): 23.7 Blood Pressure (mmHg): 170/84 Reference Range: 80 - 120 mg / dl [name], [name] ([id number]) [id number].pdf Page 9 of 9 Electronic Signature(s) Signed: 09/24/2023 11:41:18 AM By: [name] Entered By: [name] on 09/22/2023 12:06:13

## 2023-09-24 NOTE — Progress Notes (Signed)
and diastolic (congestive) heart failure Plan 1. I continued with the silver alginate 2. This is a wound that looks like it requires some debridement and definitely requires pressure offloading. I do not know how much of the latter he is prepared to do 3. He saw plastic surgery a Dr. Ferd Hibbs at [facility] wound care center. She did not give him the usual commandment precursors of a myocutaneous flap for pressure ulcers however he has not followed up with her in any case Electronic Signature(s) Signed: 09/22/2023 4:42:07 PM By: Baltazar Najjar MD Entered By: Baltazar Najjar on 09/22/2023 13:04:58 -------------------------------------------------------------------------------- SuperBill Details Patient Name: Date of Service: [name]. 09/22/2023 Medical Record Number: [id number] Patient Account Number: [id number] Date of Birth/Sex: Treating RN: [birth year] (52 y.o. [name] Primary Care Provider: Dale Pickens Other Clinician: Referring Provider: Treating Provider/Extender: RO BSO N, MICHA EL Marrianne Mood, Charlene Weeks in Treatment: 94 Diagnosis Coding ICD-10 Codes Code Description L89.153 Pressure ulcer of sacral region, stage 3 G82.20 Paraplegia, unspecified E10.621 Type 1 diabetes mellitus with foot ulcer I50.42 Chronic combined systolic (congestive) and diastolic (congestive) heart failure [name], [name] ([id number]) [id number] Page 9 of 9 Facility Procedures : CPT4 Code: 95284132 Description: 907 791 6513 - WOUND CARE VISIT-LEV 2 EST PT Modifier: Quantity: 1 Physician Procedures : CPT4 Code Description Modifier [id number] - WC PHYS LEVEL 3 - EST PT ICD-10 Diagnosis Description L89.153 Pressure ulcer of sacral region, stage 3 G82.20 Paraplegia, unspecified Quantity: 1 Electronic Signature(s) Signed: 09/22/2023 4:42:07 PM By: Baltazar Najjar MD Signed: 09/24/2023 11:41:18 AM By: [name] RN Entered By: [name] on 09/22/2023 13:08:37  and diastolic (congestive) heart failure Plan 1. I continued with the silver alginate 2. This is a wound that looks like it requires some debridement and definitely requires pressure offloading. I do not know how much of the latter he is prepared to do 3. He saw plastic surgery a Dr. Ferd Hibbs at [facility] wound care center. She did not give him the usual commandment precursors of a myocutaneous flap for pressure ulcers however he has not followed up with her in any case Electronic Signature(s) Signed: 09/22/2023 4:42:07 PM By: Baltazar Najjar MD Entered By: Baltazar Najjar on 09/22/2023 13:04:58 -------------------------------------------------------------------------------- SuperBill Details Patient Name: Date of Service: [name]. 09/22/2023 Medical Record Number: [id number] Patient Account Number: [id number] Date of Birth/Sex: Treating RN: [birth year] (52 y.o. [name] Primary Care Provider: Dale Pickens Other Clinician: Referring Provider: Treating Provider/Extender: RO BSO N, MICHA EL Marrianne Mood, Charlene Weeks in Treatment: 94 Diagnosis Coding ICD-10 Codes Code Description L89.153 Pressure ulcer of sacral region, stage 3 G82.20 Paraplegia, unspecified E10.621 Type 1 diabetes mellitus with foot ulcer I50.42 Chronic combined systolic (congestive) and diastolic (congestive) heart failure [name], [name] ([id number]) [id number] Page 9 of 9 Facility Procedures : CPT4 Code: 95284132 Description: 907 791 6513 - WOUND CARE VISIT-LEV 2 EST PT Modifier: Quantity: 1 Physician Procedures : CPT4 Code Description Modifier [id number] - WC PHYS LEVEL 3 - EST PT ICD-10 Diagnosis Description L89.153 Pressure ulcer of sacral region, stage 3 G82.20 Paraplegia, unspecified Quantity: 1 Electronic Signature(s) Signed: 09/22/2023 4:42:07 PM By: Baltazar Najjar MD Signed: 09/24/2023 11:41:18 AM By: [name] RN Entered By: [name] on 09/22/2023 13:08:37  the lower coccyx area. This is just above his anal opening. According to our measurements this has improved there is certainly no evidence of infection no palpable bone. Surface of the wound has a fibrinous covering. This does not look like healthy granulation. Nevertheless I did not debride this today. Electronic Signature(s) Signed: 09/22/2023 4:42:07 PM By: Baltazar Najjar MD Entered By: Baltazar Najjar on 09/22/2023 13:03:04 [name], [name] ([id number]) [id number] Page 4 of 9 -------------------------------------------------------------------------------- Physician Orders Details Patient  Name: Date of Service: [name], [name]. 09/22/2023 3:00 PM Medical Record Number: [id number] Patient Account Number: [id number] Date of Birth/Sex: Treating RN: [birth year] (52 y.o. 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 Return A Bathing/ Shower/ Hygiene May shower; gently cleanse wound with antibacterial soap, rinse and pat dry prior to dressing wounds Wound Treatment Wound #8 - Sacrum Wound Laterality: Midline Cleanser: Byram Ancillary Kit - 15 Day Supply (Generic) 1 x Per Day/30 Days Discharge Instructions: Use supplies as instructed; Kit contains: (15) Saline Bullets; (15) 3x3 Gauze; 15 pr Gloves Cleanser: Soap and Water 1 x Per Day/30 Days Discharge Instructions: Gently cleanse wound with antibacterial soap, rinse and pat dry prior to dressing wounds Prim Dressing: Silvercel Small 2x2 (in/in) (DME) (Generic) 1 x Per Day/30 Days ary Discharge Instructions: Apply Silvercel Small 2x2 (in/in) as instructed Secondary Dressing: (BORDER) Zetuvit Plus SILICONE BORDER Dressing 4x4 (in/in) (DME) (Generic) 1 x Per Day/30 Days Electronic Signature(s) Signed: 09/22/2023 4:42:07 PM By: Baltazar Najjar MD Signed: 09/24/2023 11:41:18 AM By: [name] RN Entered By: [name] on 09/22/2023 13:06:44 -------------------------------------------------------------------------------- Problem List Details Patient Name: Date of Service: [name] Lean EN K. 09/22/2023 3:00 PM Medical Record Number:  [id number] Patient Account Number: [id number] [name], [name] ([id number]) [id number] Page 5 of 9 Date of Birth/Sex: Treating RN: 08-02-1971 (52 y.o. [name]) [name] Primary Care Provider: Other Clinician: Dale [place] Referring Provider: Treating Provider/Extender: RO BSO N, MICHA EL Marrianne Mood, Charlene Weeks in Treatment: 1 Active Problems ICD-10 Encounter Code Description Active Date MDM Diagnosis L89.153 Pressure ulcer of sacral region, stage 3 11/27/2021 No Yes G82.20 Paraplegia, unspecified 11/27/2021 No Yes E10.621 Type 1 diabetes mellitus with foot ulcer 11/27/2021 No Yes I50.42 Chronic combined systolic (congestive) and diastolic (congestive) heart failure 11/27/2021 No Yes Inactive Problems Resolved Problems Electronic Signature(s) Signed: 09/22/2023 4:42:07 PM By: Baltazar Najjar MD Entered By: Baltazar Najjar on 09/22/2023 12:59:50 -------------------------------------------------------------------------------- Progress Note Details Patient Name: Date of Service: [name] Lean EN K. 09/22/2023 3:00 PM Medical Record Number: [id number] Patient Account Number: [id number] Date of Birth/Sex: Treating RN: 1971-11-29 (52 y.o. [name]) [name] Primary Care Provider: Dale  Other Clinician: Referring Provider: Treating Provider/Extender: RO BSO N, MICHA EL Marrianne Mood, Charlene Weeks in Treatment: 94 Subjective History of Present Illness (HPI) 52 year old patient known to [name] from a couple of previous visits to the wound center now comes with a nonhealing surgical wound which he has had since the end of November 2017. he was in the OR for a hemorrhoidectomy and a perinatal ulcer which was excised primarily and closed. 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 Again in the past we have not really done much in the way of debridements due to the fact that he really has not had much to debride to be perfectly honest. The patient is in agreement with plan he states  he really does not see any need to come twice a month for that he wants [name] to continue his monthly visits. 05-25-2023 upon evaluation today patient appears to be doing about the same in regard to his wound is actually measuring a little bit smaller however which is good news. Fortunately I do not see any signs of active infection locally or systemically which is great news and in general I think that we are moving in the right direction. 7/22. Difficult wound just below his coccyx. He has been using silver alginate as the primary dressing. He comes here for monthly follow-up. He apparently sees plastic surgery at the wound care center at [facility] as well. He has been for an initial consultation there 07-26-2023 upon evaluation today patient appears to be doing poorly currently in regard to his wound. He has been tolerating the dressing changes but unfortunately has a lot more irritation and excoriation at this point. He does have a new chair and a new cushion though he tells me the center part of the cushion he took out because it was hurting him. With that being said I am not certain that that was necessarily the best movement that seems like this may have caused some irritation. He tells me however it was extremely uncomfortable. I explained that I think he needs to get in touch with new motion and see what they can figure out here for him. 08-23-2023 upon evaluation today patient actually appears to be doing the best he has for quite some time. Fortunately there does not appear to be any signs of active infection at this time as far as a bacterial infection is concerned he has T me that he has been trying to stay off of this is much as he could. old Fortunately I do not see any evidence of infection overall systemically either which is good news. This is actually the best change of seen in his wound from visit to visit since I have been taking care of him. He tells me has been spending about 16 hours  a day off of this. 1016/24. 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 He has been using silver alginate. He applies to the dressing himself. Not sure how much he is actively offloading this. He has a wheelchair cushion. He has been to see a Careers adviser at [facility] who is a Engineer, petroleum. Apparently the only thing she wanted was a new wheelchair cushion which she received but he is back in the old one now. Electronic Signature(s) Signed: 09/22/2023 4:42:07 PM By: Baltazar Najjar MD Entered By: Baltazar Najjar on 09/22/2023 13:01:27 -------------------------------------------------------------------------------- Physical Exam Details Patient Name: Date of Service: [name] Lean EN K. 09/22/2023 3:00 PM Medical Record Number: [id number] Patient Account Number: [id number] Date of Birth/Sex: Treating RN: 1971-01-18 (52 y.o. [name] Primary Care Provider: Dale Lostant Other Clinician: Referring Provider: Treating Provider/Extender: RO BSO N, MICHA EL Marrianne Mood, Charlene Weeks in Treatment: 77 Constitutional Patient is hypertensive.. Pulse regular and within target range for patient.Marland Kitchen Respirations regular, non-labored and within target range.Marland Kitchen appears in no distress. Notes Wound exam. Somewhat of a lifeless wound in  and diastolic (congestive) heart failure Plan 1. I continued with the silver alginate 2. This is a wound that looks like it requires some debridement and definitely requires pressure offloading. I do not know how much of the latter he is prepared to do 3. He saw plastic surgery a Dr. Ferd Hibbs at [facility] wound care center. She did not give him the usual commandment precursors of a myocutaneous flap for pressure ulcers however he has not followed up with her in any case Electronic Signature(s) Signed: 09/22/2023 4:42:07 PM By: Baltazar Najjar MD Entered By: Baltazar Najjar on 09/22/2023 13:04:58 -------------------------------------------------------------------------------- SuperBill Details Patient Name: Date of Service: [name]. 09/22/2023 Medical Record Number: [id number] Patient Account Number: [id number] Date of Birth/Sex: Treating RN: [birth year] (52 y.o. [name] Primary Care Provider: Dale Pickens Other Clinician: Referring Provider: Treating Provider/Extender: RO BSO N, MICHA EL Marrianne Mood, Charlene Weeks in Treatment: 94 Diagnosis Coding ICD-10 Codes Code Description L89.153 Pressure ulcer of sacral region, stage 3 G82.20 Paraplegia, unspecified E10.621 Type 1 diabetes mellitus with foot ulcer I50.42 Chronic combined systolic (congestive) and diastolic (congestive) heart failure [name], [name] ([id number]) [id number] Page 9 of 9 Facility Procedures : CPT4 Code: 95284132 Description: 907 791 6513 - WOUND CARE VISIT-LEV 2 EST PT Modifier: Quantity: 1 Physician Procedures : CPT4 Code Description Modifier [id number] 99213 - WC PHYS LEVEL 3 - EST PT ICD-10 Diagnosis Description L89.153 Pressure ulcer of sacral region, stage 3 G82.20 Paraplegia, unspecified Quantity: 1 Electronic Signature(s) Signed: 09/22/2023 4:42:07 PM By: Baltazar Najjar MD Signed: 09/24/2023 11:41:18 AM By: [name] RN Entered By: [name] on 09/22/2023 13:08:37  [name], [name] ([id number]) [id number] Page 1 of 9 Visit Report for 09/22/2023 HPI Details Patient Name: Date of Service: [name], [name]. 09/22/2023 3:00 PM Medical Record Number: [id number] Patient Account Number: [id number] Date of Birth/Sex: Treating RN: [birth year] (52 y.o. [name]) [name] Primary Care Provider: Dale Lyford Other Clinician: Referring Provider: Treating Provider/Extender: RO BSO N, MICHA EL Marrianne Mood, Charlene Weeks in Treatment: 24 History of Present Illness HPI Description: 52 year old patient known to [name] from a couple of previous visits to the wound center now comes with a nonhealing surgical wound which he has had since the end of November 2017. he was in the OR for a hemorrhoidectomy and a perinatal ulcer which was excised primarily and closed. 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 The patient has been seen by his surgeon regularly and there has been good resolution but it has not completely healed. his hemoglobin A1c in January was 7.1% Past medical history of chronic pain, depression, nephrolithiasis, C5-C7 incomplete quadriplegia, diabetes mellitus type 1,urinary bladder surgery, cervical fusion in 1988, hemorrhoid surgery in November 2017, left knee surgery, popliteal cyst excision. 03/19/2017 -- the patient brings to my notice today that he has a area on his left heel which has opened out into a superficial ulcer and has had it on and off for the last month. 04/26/2017 -- he had a another abrasion on his left heel very close to where he had a previous ulcer and this has become a bleb which needs my attention 05/31/17 patient continues to show signs of improvement in regard to the sacral wound. There still some maceration but fortunately this does not appear to be severe. There is no evidence of infection. 06/14/17 patient appears to be doing well on evaluation today. His wound is slightly smaller  than last week's evaluation and parts that it had been somewhat stagnant. Nonetheless he is somewhat frustrated with the fact that this is not healing as quickly as you would like though I still feel like we are making some good progress. 06/21/17 patient presents today for fault concerning his sacral wound. This actually appears to be doing well with smaller measurements and he has some growth in the center part of the wound as well which is good news. Overall I'm pleased with how this is progressing. There is no evidence of infection. Readmission: 12/09/16 on evaluation today patient presents for readmission concerning an injury to the left posterior heel which occurred around 10/27/17. He states that he does not know of any specific injury but when he first noticed this it was dark and appeared to be bruised. After about a week the dark patch came off and he noted the ulceration which subsequently led to him coming in for reevaluation. The wound center. He does not have true pain although he tells me that he does sometimes have sensations that cause him to flinch when we are cleansing the wound. No fevers, chills, nausea, or vomiting noted at this time. He has been using peroxide on the wound and then covering this with a dry dressing at home until he come in for evaluation. 12/23/17-he is here in follow-up evaluation for a left heel ulcer. It is essentially unchanged. He tolerated debridement. We will continue with Prisma and follow-up in 3-4 weeks ======= Old Notes 52 year old gentleman who comes as a self-referral for a perianal problem where he's been having ulcerations and has known he has a lot of diarrhea recently. He also has  and diastolic (congestive) heart failure Plan 1. I continued with the silver alginate 2. This is a wound that looks like it requires some debridement and definitely requires pressure offloading. I do not know how much of the latter he is prepared to do 3. He saw plastic surgery a Dr. Ferd Hibbs at [facility] wound care center. She did not give him the usual commandment precursors of a myocutaneous flap for pressure ulcers however he has not followed up with her in any case Electronic Signature(s) Signed: 09/22/2023 4:42:07 PM By: Baltazar Najjar MD Entered By: Baltazar Najjar on 09/22/2023 13:04:58 -------------------------------------------------------------------------------- SuperBill Details Patient Name: Date of Service: [name]. 09/22/2023 Medical Record Number: [id number] Patient Account Number: [id number] Date of Birth/Sex: Treating RN: [birth year] (52 y.o. [name] Primary Care Provider: Dale Pickens Other Clinician: Referring Provider: Treating Provider/Extender: RO BSO N, MICHA EL Marrianne Mood, Charlene Weeks in Treatment: 94 Diagnosis Coding ICD-10 Codes Code Description L89.153 Pressure ulcer of sacral region, stage 3 G82.20 Paraplegia, unspecified E10.621 Type 1 diabetes mellitus with foot ulcer I50.42 Chronic combined systolic (congestive) and diastolic (congestive) heart failure [name], [name] ([id number]) [id number] Page 9 of 9 Facility Procedures : CPT4 Code: 95284132 Description: 907 791 6513 - WOUND CARE VISIT-LEV 2 EST PT Modifier: Quantity: 1 Physician Procedures : CPT4 Code Description Modifier [id number] 99213 - WC PHYS LEVEL 3 - EST PT ICD-10 Diagnosis Description L89.153 Pressure ulcer of sacral region, stage 3 G82.20 Paraplegia, unspecified Quantity: 1 Electronic Signature(s) Signed: 09/22/2023 4:42:07 PM By: Baltazar Najjar MD Signed: 09/24/2023 11:41:18 AM By: [name] RN Entered By: [name] on 09/22/2023 13:08:37  the lower coccyx area. This is just above his anal opening. According to our measurements this has improved there is certainly no evidence of infection no palpable bone. Surface of the wound has a fibrinous covering. This does not look like healthy granulation. Nevertheless I did not debride this today. Electronic Signature(s) Signed: 09/22/2023 4:42:07 PM By: Baltazar Najjar MD Entered By: Baltazar Najjar on 09/22/2023 13:03:04 [name], [name] ([id number]) [id number] Page 4 of 9 -------------------------------------------------------------------------------- Physician Orders Details Patient  Name: Date of Service: [name], [name]. 09/22/2023 3:00 PM Medical Record Number: [id number] Patient Account Number: [id number] Date of Birth/Sex: Treating RN: [birth year] (52 y.o. 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 Return A Bathing/ Shower/ Hygiene May shower; gently cleanse wound with antibacterial soap, rinse and pat dry prior to dressing wounds Wound Treatment Wound #8 - Sacrum Wound Laterality: Midline Cleanser: Byram Ancillary Kit - 15 Day Supply (Generic) 1 x Per Day/30 Days Discharge Instructions: Use supplies as instructed; Kit contains: (15) Saline Bullets; (15) 3x3 Gauze; 15 pr Gloves Cleanser: Soap and Water 1 x Per Day/30 Days Discharge Instructions: Gently cleanse wound with antibacterial soap, rinse and pat dry prior to dressing wounds Prim Dressing: Silvercel Small 2x2 (in/in) (DME) (Generic) 1 x Per Day/30 Days ary Discharge Instructions: Apply Silvercel Small 2x2 (in/in) as instructed Secondary Dressing: (BORDER) Zetuvit Plus SILICONE BORDER Dressing 4x4 (in/in) (DME) (Generic) 1 x Per Day/30 Days Electronic Signature(s) Signed: 09/22/2023 4:42:07 PM By: Baltazar Najjar MD Signed: 09/24/2023 11:41:18 AM By: [name] RN Entered By: [name] on 09/22/2023 13:06:44 -------------------------------------------------------------------------------- Problem List Details Patient Name: Date of Service: [name] Lean EN K. 09/22/2023 3:00 PM Medical Record Number:  [id number] Patient Account Number: [id number] [name], [name] ([id number]) [id number] Page 5 of 9 Date of Birth/Sex: Treating RN: 08-02-1971 (52 y.o. [name]) [name] Primary Care Provider: Other Clinician: Dale [place] Referring Provider: Treating Provider/Extender: RO BSO N, MICHA EL Marrianne Mood, Charlene Weeks in Treatment: 1 Active Problems ICD-10 Encounter Code Description Active Date MDM Diagnosis L89.153 Pressure ulcer of sacral region, stage 3 11/27/2021 No Yes G82.20 Paraplegia, unspecified 11/27/2021 No Yes E10.621 Type 1 diabetes mellitus with foot ulcer 11/27/2021 No Yes I50.42 Chronic combined systolic (congestive) and diastolic (congestive) heart failure 11/27/2021 No Yes Inactive Problems Resolved Problems Electronic Signature(s) Signed: 09/22/2023 4:42:07 PM By: Baltazar Najjar MD Entered By: Baltazar Najjar on 09/22/2023 12:59:50 -------------------------------------------------------------------------------- Progress Note Details Patient Name: Date of Service: [name] Lean EN K. 09/22/2023 3:00 PM Medical Record Number: [id number] Patient Account Number: [id number] Date of Birth/Sex: Treating RN: 1971-11-29 (52 y.o. [name]) [name] Primary Care Provider: Dale  Other Clinician: Referring Provider: Treating Provider/Extender: RO BSO N, MICHA EL Marrianne Mood, Charlene Weeks in Treatment: 94 Subjective History of Present Illness (HPI) 52 year old patient known to [name] from a couple of previous visits to the wound center now comes with a nonhealing surgical wound which he has had since the end of November 2017. he was in the OR for a hemorrhoidectomy and a perinatal ulcer which was excised primarily and closed. 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 Again in the past we have not really done much in the way of debridements due to the fact that he really has not had much to debride to be perfectly honest. The patient is in agreement with plan he states  he really does not see any need to come twice a month for that he wants [name] to continue his monthly visits. 05-25-2023 upon evaluation today patient appears to be doing about the same in regard to his wound is actually measuring a little bit smaller however which is good news. Fortunately I do not see any signs of active infection locally or systemically which is great news and in general I think that we are moving in the right direction. 7/22. Difficult wound just below his coccyx. He has been using silver alginate as the primary dressing. He comes here for monthly follow-up. He apparently sees plastic surgery at the wound care center at [facility] as well. He has been for an initial consultation there 07-26-2023 upon evaluation today patient appears to be doing poorly currently in regard to his wound. He has been tolerating the dressing changes but unfortunately has a lot more irritation and excoriation at this point. He does have a new chair and a new cushion though he tells me the center part of the cushion he took out because it was hurting him. With that being said I am not certain that that was necessarily the best movement that seems like this may have caused some irritation. He tells me however it was extremely uncomfortable. I explained that I think he needs to get in touch with new motion and see what they can figure out here for him. 08-23-2023 upon evaluation today patient actually appears to be doing the best he has for quite some time. Fortunately there does not appear to be any signs of active infection at this time as far as a bacterial infection is concerned he has T me that he has been trying to stay off of this is much as he could. old Fortunately I do not see any evidence of infection overall systemically either which is good news. This is actually the best change of seen in his wound from visit to visit since I have been taking care of him. He tells me has been spending about 16 hours  a day off of this. 1016/24. 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 He has been using silver alginate. He applies to the dressing himself. Not sure how much he is actively offloading this. He has a wheelchair cushion. He has been to see a Careers adviser at [facility] who is a Engineer, petroleum. Apparently the only thing she wanted was a new wheelchair cushion which she received but he is back in the old one now. Electronic Signature(s) Signed: 09/22/2023 4:42:07 PM By: Baltazar Najjar MD Entered By: Baltazar Najjar on 09/22/2023 13:01:27 -------------------------------------------------------------------------------- Physical Exam Details Patient Name: Date of Service: [name] Lean EN K. 09/22/2023 3:00 PM Medical Record Number: [id number] Patient Account Number: [id number] Date of Birth/Sex: Treating RN: 1971-01-18 (52 y.o. [name] Primary Care Provider: Dale Lostant Other Clinician: Referring Provider: Treating Provider/Extender: RO BSO N, MICHA EL Marrianne Mood, Charlene Weeks in Treatment: 77 Constitutional Patient is hypertensive.. Pulse regular and within target range for patient.Marland Kitchen Respirations regular, non-labored and within target range.Marland Kitchen appears in no distress. Notes Wound exam. Somewhat of a lifeless wound in  [name], [name] ([id number]) [id number] Page 1 of 9 Visit Report for 09/22/2023 HPI Details Patient Name: Date of Service: [name], [name]. 09/22/2023 3:00 PM Medical Record Number: [id number] Patient Account Number: [id number] Date of Birth/Sex: Treating RN: [birth year] (52 y.o. [name]) [name] Primary Care Provider: Dale Lyford Other Clinician: Referring Provider: Treating Provider/Extender: RO BSO N, MICHA EL Marrianne Mood, Charlene Weeks in Treatment: 24 History of Present Illness HPI Description: 52 year old patient known to [name] from a couple of previous visits to the wound center now comes with a nonhealing surgical wound which he has had since the end of November 2017. he was in the OR for a hemorrhoidectomy and a perinatal ulcer which was excised primarily and closed. 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 The patient has been seen by his surgeon regularly and there has been good resolution but it has not completely healed. his hemoglobin A1c in January was 7.1% Past medical history of chronic pain, depression, nephrolithiasis, C5-C7 incomplete quadriplegia, diabetes mellitus type 1,urinary bladder surgery, cervical fusion in 1988, hemorrhoid surgery in November 2017, left knee surgery, popliteal cyst excision. 03/19/2017 -- the patient brings to my notice today that he has a area on his left heel which has opened out into a superficial ulcer and has had it on and off for the last month. 04/26/2017 -- he had a another abrasion on his left heel very close to where he had a previous ulcer and this has become a bleb which needs my attention 05/31/17 patient continues to show signs of improvement in regard to the sacral wound. There still some maceration but fortunately this does not appear to be severe. There is no evidence of infection. 06/14/17 patient appears to be doing well on evaluation today. His wound is slightly smaller  than last week's evaluation and parts that it had been somewhat stagnant. Nonetheless he is somewhat frustrated with the fact that this is not healing as quickly as you would like though I still feel like we are making some good progress. 06/21/17 patient presents today for fault concerning his sacral wound. This actually appears to be doing well with smaller measurements and he has some growth in the center part of the wound as well which is good news. Overall I'm pleased with how this is progressing. There is no evidence of infection. Readmission: 12/09/16 on evaluation today patient presents for readmission concerning an injury to the left posterior heel which occurred around 10/27/17. He states that he does not know of any specific injury but when he first noticed this it was dark and appeared to be bruised. After about a week the dark patch came off and he noted the ulceration which subsequently led to him coming in for reevaluation. The wound center. He does not have true pain although he tells me that he does sometimes have sensations that cause him to flinch when we are cleansing the wound. No fevers, chills, nausea, or vomiting noted at this time. He has been using peroxide on the wound and then covering this with a dry dressing at home until he come in for evaluation. 12/23/17-he is here in follow-up evaluation for a left heel ulcer. It is essentially unchanged. He tolerated debridement. We will continue with Prisma and follow-up in 3-4 weeks ======= Old Notes 52 year old gentleman who comes as a self-referral for a perianal problem where he's been having ulcerations and has known he has a lot of diarrhea recently. He also has  and diastolic (congestive) heart failure Plan 1. I continued with the silver alginate 2. This is a wound that looks like it requires some debridement and definitely requires pressure offloading. I do not know how much of the latter he is prepared to do 3. He saw plastic surgery a Dr. Ferd Hibbs at [facility] wound care center. She did not give him the usual commandment precursors of a myocutaneous flap for pressure ulcers however he has not followed up with her in any case Electronic Signature(s) Signed: 09/22/2023 4:42:07 PM By: Baltazar Najjar MD Entered By: Baltazar Najjar on 09/22/2023 13:04:58 -------------------------------------------------------------------------------- SuperBill Details Patient Name: Date of Service: [name]. 09/22/2023 Medical Record Number: [id number] Patient Account Number: [id number] Date of Birth/Sex: Treating RN: [birth year] (52 y.o. [name] Primary Care Provider: Dale Pickens Other Clinician: Referring Provider: Treating Provider/Extender: RO BSO N, MICHA EL Marrianne Mood, Charlene Weeks in Treatment: 94 Diagnosis Coding ICD-10 Codes Code Description L89.153 Pressure ulcer of sacral region, stage 3 G82.20 Paraplegia, unspecified E10.621 Type 1 diabetes mellitus with foot ulcer I50.42 Chronic combined systolic (congestive) and diastolic (congestive) heart failure [name], [name] ([id number]) [id number] Page 9 of 9 Facility Procedures : CPT4 Code: 95284132 Description: 907 791 6513 - WOUND CARE VISIT-LEV 2 EST PT Modifier: Quantity: 1 Physician Procedures : CPT4 Code Description Modifier [id number] - WC PHYS LEVEL 3 - EST PT ICD-10 Diagnosis Description L89.153 Pressure ulcer of sacral region, stage 3 G82.20 Paraplegia, unspecified Quantity: 1 Electronic Signature(s) Signed: 09/22/2023 4:42:07 PM By: Baltazar Najjar MD Signed: 09/24/2023 11:41:18 AM By: [name] RN Entered By: [name] on 09/22/2023 13:08:37  and diastolic (congestive) heart failure Plan 1. I continued with the silver alginate 2. This is a wound that looks like it requires some debridement and definitely requires pressure offloading. I do not know how much of the latter he is prepared to do 3. He saw plastic surgery a Dr. Ferd Hibbs at [facility] wound care center. She did not give him the usual commandment precursors of a myocutaneous flap for pressure ulcers however he has not followed up with her in any case Electronic Signature(s) Signed: 09/22/2023 4:42:07 PM By: Baltazar Najjar MD Entered By: Baltazar Najjar on 09/22/2023 13:04:58 -------------------------------------------------------------------------------- SuperBill Details Patient Name: Date of Service: [name]. 09/22/2023 Medical Record Number: [id number] Patient Account Number: [id number] Date of Birth/Sex: Treating RN: [birth year] (52 y.o. [name] Primary Care Provider: Dale Pickens Other Clinician: Referring Provider: Treating Provider/Extender: RO BSO N, MICHA EL Marrianne Mood, Charlene Weeks in Treatment: 94 Diagnosis Coding ICD-10 Codes Code Description L89.153 Pressure ulcer of sacral region, stage 3 G82.20 Paraplegia, unspecified E10.621 Type 1 diabetes mellitus with foot ulcer I50.42 Chronic combined systolic (congestive) and diastolic (congestive) heart failure [name], [name] ([id number]) [id number] Page 9 of 9 Facility Procedures : CPT4 Code: [id number] Description: 907 791 6513 - WOUND CARE VISIT-LEV 2 EST PT Modifier: Quantity: 1 Physician Procedures : CPT4 Code Description Modifier [id number] - WC PHYS LEVEL 3 - EST PT ICD-10 Diagnosis Description L89.153 Pressure ulcer of sacral region, stage 3 G82.20 Paraplegia, unspecified Quantity: 1 Electronic Signature(s) Signed: 09/22/2023 4:42:07 PM By: Baltazar Najjar MD Signed: 09/24/2023 11:41:18 AM By: [name] RN Entered By: [name] on 09/22/2023 13:08:37  he should be seen at least twice a month for debridements. Again in the past we have not really done much in the way of debridements due to the fact that he really has not had much to debride to be perfectly honest. The patient is in agreement with plan he states  he really does not see any need to come twice a month for that he wants [name] to continue his monthly visits. 05-25-2023 upon evaluation today patient appears to be doing about the same in regard to his wound is actually measuring a little bit smaller however which is good news. Fortunately I do not see any signs of active infection locally or systemically which is great news and in general I think that we are moving in the right direction. 7/22. Difficult wound just below his coccyx. He has been using silver alginate as the primary dressing. He comes here for monthly follow-up. He apparently sees plastic surgery at the wound care center at [facility] as well. He has been for an initial consultation there 07-26-2023 upon evaluation today patient appears to be doing poorly currently in regard to his wound. He has been tolerating the dressing changes but unfortunately has a lot more irritation and excoriation at this point. He does have a new chair and a new cushion though he tells me the center part of the cushion he took out because it was hurting him. With that being said I am not certain that that was necessarily the best movement that seems like this may have caused some irritation. He tells me however it was extremely uncomfortable. I explained that I think he needs to get in touch with new motion and see what they can figure out here for him. 08-23-2023 upon evaluation today patient actually appears to be doing the best he has for quite some time. Fortunately there does not appear to be any signs of active infection at this time as far as a bacterial infection is concerned he has T me that he has been trying to stay off of this is much as he could. old Fortunately I do not see any evidence of infection overall systemically either which is good news. This is actually the best change of seen in his wound from visit to visit since I have been taking care of him. He tells me has been spending about 16 hours  a day off of this. 1016/24. 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 He has been using silver alginate. He applies to the dressing himself. Not sure how much he is actively offloading this. He has a wheelchair cushion. He has been to see a Careers adviser at [facility] who is a Engineer, petroleum. Apparently the only thing she wanted was a new wheelchair cushion which she received but he is back in the old one now. Electronic Signature(s) Signed: 09/22/2023 4:42:07 PM By: Baltazar Najjar MD Entered By: Baltazar Najjar on 09/22/2023 13:01:27 -------------------------------------------------------------------------------- Physical Exam Details Patient Name: Date of Service: [name] Lean EN K. 09/22/2023 3:00 PM Medical Record Number: [id number] Patient Account Number: [id number] Date of Birth/Sex: Treating RN: 1971-01-18 (52 y.o. [name] Primary Care Provider: Dale Lostant Other Clinician: Referring Provider: Treating Provider/Extender: RO BSO N, MICHA EL Marrianne Mood, Charlene Weeks in Treatment: 77 Constitutional Patient is hypertensive.. Pulse regular and within target range for patient.Marland Kitchen Respirations regular, non-labored and within target range.Marland Kitchen appears in no distress. Notes Wound exam. Somewhat of a lifeless wound in  and diastolic (congestive) heart failure Plan 1. I continued with the silver alginate 2. This is a wound that looks like it requires some debridement and definitely requires pressure offloading. I do not know how much of the latter he is prepared to do 3. He saw plastic surgery a Dr. Ferd Hibbs at [facility] wound care center. She did not give him the usual commandment precursors of a myocutaneous flap for pressure ulcers however he has not followed up with her in any case Electronic Signature(s) Signed: 09/22/2023 4:42:07 PM By: Baltazar Najjar MD Entered By: Baltazar Najjar on 09/22/2023 13:04:58 -------------------------------------------------------------------------------- SuperBill Details Patient Name: Date of Service: [name]. 09/22/2023 Medical Record Number: [id number] Patient Account Number: [id number] Date of Birth/Sex: Treating RN: [birth year] (52 y.o. [name] Primary Care Provider: Dale Pickens Other Clinician: Referring Provider: Treating Provider/Extender: RO BSO N, MICHA EL Marrianne Mood, Charlene Weeks in Treatment: 94 Diagnosis Coding ICD-10 Codes Code Description L89.153 Pressure ulcer of sacral region, stage 3 G82.20 Paraplegia, unspecified E10.621 Type 1 diabetes mellitus with foot ulcer I50.42 Chronic combined systolic (congestive) and diastolic (congestive) heart failure [name], [name] ([id number]) [id number] Page 9 of 9 Facility Procedures : CPT4 Code: [id number] Description: 907 791 6513 - WOUND CARE VISIT-LEV 2 EST PT Modifier: Quantity: 1 Physician Procedures : CPT4 Code Description Modifier [id number] - WC PHYS LEVEL 3 - EST PT ICD-10 Diagnosis Description L89.153 Pressure ulcer of sacral region, stage 3 G82.20 Paraplegia, unspecified Quantity: 1 Electronic Signature(s) Signed: 09/22/2023 4:42:07 PM By: Baltazar Najjar MD Signed: 09/24/2023 11:41:18 AM By: [name] RN Entered By: [name] on 09/22/2023 13:08:37

## 2023-09-27 NOTE — Plan of Care (Signed)
[facility] Tonsillectomy/Adenoidectomy, Postoperative PEDS care plan entered in error.
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
## 2023-09-29 ENCOUNTER — Encounter: Payer: Self-pay | Admitting: Urology

## 2023-09-29 ENCOUNTER — Ambulatory Visit: Payer: Medicare Other | Admitting: Urology

## 2023-09-29 VITALS — BP 108/65 | HR 49

## 2023-09-29 DIAGNOSIS — R3989 Other symptoms and signs involving the genitourinary system: Secondary | ICD-10-CM

## 2023-09-29 DIAGNOSIS — R8271 Bacteriuria: Secondary | ICD-10-CM | POA: Diagnosis not present

## 2023-09-29 DIAGNOSIS — N319 Neuromuscular dysfunction of bladder, unspecified: Secondary | ICD-10-CM

## 2023-09-29 DIAGNOSIS — R8281 Pyuria: Secondary | ICD-10-CM

## 2023-09-29 DIAGNOSIS — R319 Hematuria, unspecified: Secondary | ICD-10-CM

## 2023-09-29 DIAGNOSIS — N200 Calculus of kidney: Secondary | ICD-10-CM

## 2023-09-29 LAB — MICROSCOPIC EXAMINATION

## 2023-09-29 LAB — URINALYSIS, COMPLETE
Bilirubin, UA: NEGATIVE
Glucose, UA: NEGATIVE
Ketones, UA: NEGATIVE
Nitrite, UA: NEGATIVE
Protein,UA: NEGATIVE
Specific Gravity, UA: 1.02 (ref 1.005–1.030)
Urobilinogen, Ur: 0.2 mg/dL (ref 0.2–1.0)
pH, UA: 6 (ref 5.0–7.5)

## 2023-09-30 ENCOUNTER — Other Ambulatory Visit: Payer: Self-pay | Admitting: Internal Medicine

## 2023-10-02 LAB — CULTURE, URINE COMPREHENSIVE

## 2023-10-05 ENCOUNTER — Telehealth: Payer: Self-pay | Admitting: Urology

## 2023-10-05 NOTE — Telephone Encounter (Signed)
He did not read his MyChart message.    Mr. Schoenbachler, Your urine culture was negative, so continue the Cipro.  I would like to see you again in two months to recheck your urine.  Please call the office [phone]) or request an appointment through MyChart.   Seema Blum, PA-C

## 2023-10-08 NOTE — Telephone Encounter (Signed)
Thursday Nov 25, 2023  Appt at 1:30 PM (30 min    Pt scheduled.

## 2023-10-18 ENCOUNTER — Encounter: Payer: Medicare Other | Attending: Physician Assistant | Admitting: Physician Assistant

## 2023-10-18 DIAGNOSIS — E10622 Type 1 diabetes mellitus with other skin ulcer: Secondary | ICD-10-CM | POA: Insufficient documentation

## 2023-10-18 DIAGNOSIS — I5042 Chronic combined systolic (congestive) and diastolic (congestive) heart failure: Secondary | ICD-10-CM | POA: Insufficient documentation

## 2023-10-18 DIAGNOSIS — I110 Hypertensive heart disease with heart failure: Secondary | ICD-10-CM | POA: Insufficient documentation

## 2023-10-18 DIAGNOSIS — L89153 Pressure ulcer of sacral region, stage 3: Secondary | ICD-10-CM | POA: Insufficient documentation

## 2023-10-18 NOTE — Progress Notes (Signed)
BRAXLEY, BLISSETT ([id number]) [id number] Page 1 of 9 Visit Report for 10/18/2023 Arrival Information Details Patient Name: Date of Service: DANTRELL, VANROSSUM. 10/18/2023 3:15 PM Medical Record Number: [id number] Patient Account Number: [id number] Date of Birth/Sex: Treating RN: [birth year] (52 y.o. [name]) [name] Primary Care Tyja Gortney: Dale [place] Other Clinician: Referring Cheryal Salas: Treating Kort Stettler/Extender: Antony Blackbird Weeks in Treatment: 43 Visit Information History Since Last Visit Added or deleted any medications: No Patient Arrived: Wheel Chair Any new allergies or adverse reactions: No Arrival Time: 15:22 Had a fall or experienced change in No Accompanied By: self activities of daily living that may affect Transfer Assistance: None risk of falls: Patient Identification Verified: Yes Signs or symptoms of abuse/neglect since last visito No Secondary Verification Process Completed: Yes Hospitalized since last visit: No Patient Requires Transmission-Based Precautions: No Implantable device outside of the clinic excluding No Patient Has Alerts: No cellular tissue based products placed in the center since last visit: Has Dressing in Place as Prescribed: Yes Pain Present Now: No Electronic Signature(s) Unsigned Entered ByYevonne Pax on 10/18/2023 12:22:42 -------------------------------------------------------------------------------- Clinic Level of Care Assessment Details Patient Name: Date of Service: XAVIEN, HOENE 10/18/2023 3:15 PM Medical Record Number: [id number] Patient Account Number: [id number] Date of Birth/Sex: Treating RN: [birth year] (52 y.o. [name] Primary Care Bailynn Dyk: Dale [place] Other Clinician: Referring Chalise Pe: Treating Leiliana Foody/Extender: Antony Blackbird Weeks in Treatment: 98 Clinic Level of Care Assessment Items TOOL 4 Quantity Score X- 1 0 Use when only an EandM is  performed on FOLLOW-UP visit ASSESSMENTS - Nursing Assessment / Reassessment X- 1 10 Reassessment of Co-morbidities (includes updates in patient status) RENEE, WALKENHORST ([id number]) [id number] Page 2 of 9 X- 1 5 Reassessment of Adherence to Treatment Plan ASSESSMENTS - Wound and Skin A ssessment / Reassessment X - Simple Wound Assessment / Reassessment - one wound 1 5 []  - 0 Complex Wound Assessment / Reassessment - multiple wounds []  - 0 Dermatologic / Skin Assessment (not related to wound area) ASSESSMENTS - Focused Assessment []  - 0 Circumferential Edema Measurements - multi extremities []  - 0 Nutritional Assessment / Counseling / Intervention []  - 0 Lower Extremity Assessment (monofilament, tuning fork, pulses) []  - 0 Peripheral Arterial Disease Assessment (using hand held doppler) ASSESSMENTS - Ostomy and/or Continence Assessment and Care []  - 0 Incontinence Assessment and Management []  - 0 Ostomy Care Assessment and Management (repouching, etc.) PROCESS - Coordination of Care X - Simple Patient / Family Education for ongoing care 1 15 []  - 0 Complex (extensive) Patient / Family Education for ongoing care []  - 0 Staff obtains Chiropractor, Records, T Results / Process Orders est []  - 0 Staff telephones HHA, Nursing Homes / Clarify orders / etc []  - 0 Routine Transfer to another Facility (non-emergent condition) []  - 0 Routine Hospital Admission (non-emergent condition) []  - 0 New Admissions / Manufacturing engineer / Ordering NPWT Apligraf, etc. , []  - 0 Emergency Hospital Admission (emergent condition) X- 1 10 Simple Discharge Coordination []  - 0 Complex (extensive) Discharge Coordination PROCESS - Special Needs []  - 0 Pediatric / Minor Patient Management []  - 0 Isolation Patient Management []  - 0 Hearing / Language / Visual special needs []  - 0 Assessment of Community assistance (transportation, D/C planning, etc.) []  -  0 Additional assistance / Altered mentation []  - 0 Support Surface(s) Assessment (bed, cushion, seat, etc.) INTERVENTIONS - Wound Cleansing / Measurement X - Simple Wound Cleansing - one wound 1 5 []  -  0 Complex Wound Cleansing - multiple wounds X- 1 5 Wound Imaging (photographs - any number of wounds) []  - 0 Wound Tracing (instead of photographs) X- 1 5 Simple Wound Measurement - one wound []  - 0 Complex Wound Measurement - multiple wounds INTERVENTIONS - Wound Dressings X - Small Wound Dressing one or multiple wounds 1 10 []  - 0 Medium Wound Dressing one or multiple wounds []  - 0 Large Wound Dressing one or multiple wounds []  - 0 Application of Medications - topical []  - 0 Application of Medications - injection INTERVENTIONS - Miscellaneous DORIN, XIE ([id number]) [id number] Page 3 of 9 []  - 0 External ear exam []  - 0 Specimen Collection (cultures, biopsies, blood, body fluids, etc.) []  - 0 Specimen(s) / Culture(s) sent or taken to Lab for analysis []  - 0 Patient Transfer (multiple staff / Michiel Sites Lift / Similar devices) []  - 0 Simple Staple / Suture removal (25 or less) []  - 0 Complex Staple / Suture removal (26 or more) []  - 0 Hypo / Hyperglycemic Management (close monitor of Blood Glucose) []  - 0 Ankle / Brachial Index (ABI) - do not check if billed separately X- 1 5 Vital Signs Has the patient been seen at the hospital within the last three years: Yes Total Score: 75 Level Of Care: New/Established - Level 2 Electronic Signature(s) Unsigned Entered ByYevonne Pax on 10/18/2023 12:35:30 -------------------------------------------------------------------------------- Encounter Discharge Information Details Patient Name: Date of Service: HERMON, FIERSTEIN 10/18/2023 3:15 PM Medical Record Number: [id number] Patient Account Number: [id number] Date of Birth/Sex: Treating RN: Aug 08, 1971 (52 y.o. [name]) [name] Primary Care  Sladen Plancarte: Dale [place] Other Clinician: Referring Naama Sappington: Treating Jerrica Thorman/Extender: Antony Blackbird Weeks in Treatment: 70 Encounter Discharge Information Items Discharge Condition: Stable Ambulatory Status: Wheelchair Discharge Destination: Home Transportation: Private Auto Accompanied By: self Schedule Follow-up Appointment: Yes Clinical Summary of Care: Electronic Signature(s) Signed: 10/18/2023 3:59:07 PM By: [name] RN Entered By: [name] on 10/18/2023 12:59:07 Cavazos, Hillery Hunter ([id number]) [id number] Page 4 of 9 -------------------------------------------------------------------------------- Lower Extremity Assessment Details Patient Name: Date of Service: KAMAREN, DEADMOND 10/18/2023 3:15 PM Medical Record Number: [id number] Patient Account Number: [id number] Date of Birth/Sex: Treating RN: 19-Nov-1971 (52 y.o. [name]) [name] Primary Care Janeece Blok: Dale [place] Other Clinician: Referring Joselin Crandell: Treating Anniemae Haberkorn/Extender: Antony Blackbird Weeks in Treatment: 95 Electronic Signature(s) Unsigned Entered By: [name] on 10/18/2023 12:29:20 -------------------------------------------------------------------------------- Multi Wound Chart Details Patient Name: Date of Service: ZOLAN, UTTLEY 10/18/2023 3:15 PM Medical Record Number: [id number] Patient Account Number: [id number] Date of Birth/Sex: Treating RN: 11-Oct-1971 (52 y.o. [name]) [name] Primary Care Jayten Gabbard: Dale [place] Other Clinician: Referring Kalob Bergen: Treating Vincente Asbridge/Extender: Antony Blackbird Weeks in Treatment: 19 Vital Signs Height(in): 72 Pulse(bpm): 54 Weight(lbs): 175 Blood Pressure(mmHg): 148/83 Body Mass Index(BMI): 23.7 Temperature(F): 97.6 Respiratory Rate(breaths/min): 18 [8:Photos:] [N/A:N/A] Midline Sacrum N/A N/A Wound Location: Gradually Appeared N/A N/A Wounding Event: Pressure Ulcer  N/A N/A Primary Etiology: Congestive Heart Failure, Type I N/A N/A Comorbid History: Diabetes, History of pressure wounds, Paraplegia 05/07/2021 N/A N/A Date Acquired: 64 N/A N/A Weeks of Treatment: Open N/A N/A Wound Status: No N/A N/A Wound Recurrence: 1x1x0.3 N/A N/A Measurements L x W x D (cm) 0.785 N/A N/A A (cm) : rea 0.236 N/A N/A Volume (cm) : 85.70% N/A N/A % Reduction in A rea: 89.30% N/A N/A % Reduction in Volume: Category/Stage III N/A N/A Classification: Medium N/A N/A Exudate A mount: Serosanguineous N/A N/A Exudate Type: red, brown  N/A N/A Exudate Color: Thickened N/A N/A Wound Margin: KAZUYA, CANCILLA ([id number]) [id number].pdf Page 5 of 9 Large (67-100%) N/A N/A Granulation Amount: Red N/A N/A Granulation Quality: None Present (0%) N/A N/A Necrotic Amount: Fat Layer (Subcutaneous Tissue): Yes N/A N/A Exposed Structures: Fascia: No Tendon: No Muscle: No Joint: No Bone: No None N/A N/A Epithelialization: Treatment Notes Electronic Signature(s) Unsigned Entered ByYevonne Pax on 10/18/2023 12:29:25 -------------------------------------------------------------------------------- Multi-Disciplinary Care Plan Details Patient Name: Date of Service: ARMOUR, SILVER. 10/18/2023 3:15 PM Medical Record Number: [id number] Patient Account Number: [id number] Date of Birth/Sex: Treating RN: 22-Nov-1971 (52 y.o. [name] Primary Care Samvel Zinn: Dale [place] Other Clinician: Referring Miriah Maruyama: Treating Ballard Budney/Extender: Antony Blackbird Weeks in Treatment: 70 Active Inactive Wound/Skin Impairment Nursing Diagnoses: Knowledge deficit related to ulceration/compromised skin integrity Goals: Patient/caregiver will verbalize understanding of skin care regimen Date Initiated: 11/27/2021 Target Resolution Date: 10/29/2022 Goal Status: Active Ulcer/skin breakdown will have a volume reduction of 30% by week  4 Date Initiated: 11/27/2021 Date Inactivated: 03/29/2023 Target Resolution Date: 01/28/2022 Unmet Reason: comorbidities/non Goal Status: Unmet compliance Ulcer/skin breakdown will have a volume reduction of 50% by week 8 Date Initiated: 11/27/2021 Date Inactivated: 03/29/2023 Target Resolution Date: 02/25/2022 Unmet Reason: comorbidities/non Goal Status: Unmet compliance Ulcer/skin breakdown will have a volume reduction of 80% by week 12 Date Initiated: 11/27/2021 Date Inactivated: 03/29/2023 Target Resolution Date: 03/28/2022 Unmet Reason: comorbidities/non Goal Status: Unmet compliance Ulcer/skin breakdown will heal within 14 weeks Date Initiated: 11/27/2021 Date Inactivated: 05/25/2023 Target Resolution Date: 04/27/2022 Unmet Reason: comorbidities/non Goal Status: Unmet compliance Interventions: Assess patient/caregiver ability to obtain necessary supplies Assess patient/caregiver ability to perform ulcer/skin care regimen upon admission and as needed Assess ulceration(s) every visit MAYLON, HABERLE ([id number]) [id number].pdf Page 6 of 9 Notes: Electronic Signature(s) Unsigned Entered By: [name] on 10/18/2023 12:29:43 -------------------------------------------------------------------------------- Pain Assessment Details Patient Name: Date of Service: TREVAUGHN, MULLANE. 10/18/2023 3:15 PM Medical Record Number: [id number] Patient Account Number: [id number] Date of Birth/Sex: Treating RN: 03-Mar-1971 (52 y.o. [name]) [name] Primary Care Kade Rickels: Dale [place] Other Clinician: Referring Robertlee Rogacki: Treating Tabb Croghan/Extender: Antony Blackbird Weeks in Treatment: 41 Active Problems Location of Pain Severity and Description of Pain Patient Has Paino No Site Locations Pain Management and Medication Current Pain Management: Electronic Signature(s) Unsigned Entered ByYevonne Pax on 10/18/2023 12:23:42 Signature(s): Andonian,  Hillery Hunter ([id number]) [id number] Date(s): [id number] Page 7 of 9 -------------------------------------------------------------------------------- Patient/Caregiver Education Details Patient Name: Date of Service: JOHNCARLOS, JUNGE 11/11/2024andnbsp3:15 PM Medical Record Number: [id number] Patient Account Number: [id number] Date of Birth/Gender: Treating RN: [birth year] (52 y.o. [name]) [name] Primary Care Physician: Dale [place] Other Clinician: Referring Physician: Treating Physician/Extender: Antony Blackbird Weeks in Treatment: 77 Education Assessment Education Provided To: Patient Education Topics Provided Pressure: Handouts: Pressure Injury: Prevention and Offloading Methods: Explain/Verbal Responses: State content correctly Electronic Signature(s) Unsigned Entered ByYevonne Pax on 10/18/2023 12:30:10 -------------------------------------------------------------------------------- Wound Assessment Details Patient Name: Date of Service: RANARD, STOCKSTILL 10/18/2023 3:15 PM Medical Record Number: [id number] Patient Account Number: [id number] Date of Birth/Sex: Treating RN: Nov 29, 1971 (52 y.o. [name]) [name] Primary Care Makelle Marrone: Dale [place] Other Clinician: Referring Tascha Casares: Treating Aelyn Stanaland/Extender: Antony Blackbird Weeks in Treatment: 98 Wound Status Wound Number: 8 Primary Pressure Ulcer Etiology: Wound Location: Midline Sacrum Wound Open Wounding Event: Gradually Appeared Status: Date Acquired: 05/07/2021 Comorbid Congestive Heart Failure, Type I Diabetes, History of pressure Weeks Of Treatment: 98 History: wounds, Paraplegia Clustered Wound: No Photos Berrong, Recardo  K ([id number]) [id number] Page 8 of 9 Wound Measurements Length: (cm) 1 Width: (cm) 1 Depth: (cm) 0.3 Area: (cm) 0.785 Volume: (cm) 0.236 % Reduction in Area: 85.7% % Reduction in Volume: 89.3% Epithelialization:  None Tunneling: No Undermining: No Wound Description Classification: Category/Stage III Wound Margin: Thickened Exudate Amount: Medium Exudate Type: Serosanguineous Exudate Color: red, brown Foul Odor After Cleansing: No Slough/Fibrino No Wound Bed Granulation Amount: Large (67-100%) Exposed Structure Granulation Quality: Red Fascia Exposed: No Necrotic Amount: None Present (0%) Fat Layer (Subcutaneous Tissue) Exposed: Yes Tendon Exposed: No Muscle Exposed: No Joint Exposed: No Bone Exposed: No Treatment Notes Wound #8 (Sacrum) Wound Laterality: Midline Cleanser Byram Ancillary Kit - 15 Day Supply Discharge Instruction: Use supplies as instructed; Kit contains: (15) Saline Bullets; (15) 3x3 Gauze; 15 pr Gloves Soap and Water Discharge Instruction: Gently cleanse wound with antibacterial soap, rinse and pat dry prior to dressing wounds Peri-Wound Care Topical Primary Dressing Silvercel Small 2x2 (in/in) Discharge Instruction: Apply Silvercel Small 2x2 (in/in) as instructed Secondary Dressing (BORDER) Zetuvit Plus SILICONE BORDER Dressing 4x4 (in/in) Secured With Compression Wrap Compression Stockings Add-Ons Electronic Signature(s) Unsigned Entered ByYevonne Pax on 10/18/2023 12:28:34 Signature(s): Pedrosa, Hillery Hunter ([id number]) [id number] Date(s): _[id number] Page 9 of 9 -------------------------------------------------------------------------------- Vitals Details Patient Name: Date of Service: CORTLANDT, KOSKY. 10/18/2023 3:15 PM Medical Record Number: [id number] Patient Account Number: [id number] Date of Birth/Sex: Treating RN: 06/20/1971 (52 y.o. [name]) [name] Primary Care Kannen Moxey: Dale [place] Other Clinician: Referring Yanel Dombrosky: Treating Danniela Mcbrearty/Extender: Antony Blackbird Weeks in Treatment: 41 Vital Signs Time Taken: 15:22 Temperature (F): 97.6 Height (in): 72 Pulse (bpm): 54 Weight (lbs): 175 Respiratory Rate  (breaths/min): 18 Body Mass Index (BMI): 23.7 Blood Pressure (mmHg): 148/83 Reference Range: 80 - 120 mg / dl Electronic Signature(s) Unsigned Entered ByYevonne Pax on 10/18/2023 12:23:28 Signature(s): Date(s):

## 2023-10-18 NOTE — Progress Notes (Addendum)
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 Bathing/ Shower/ Hygiene: May shower; gently cleanse wound with antibacterial soap, rinse and pat dry prior to dressing wounds The following medication(s) was prescribed: nystatin topical 100,000 unit/gram powder powder topical once daily to affected region in the perianal area x 30 days  starting 10/18/2023 WOUND #8: - Sacrum Wound Laterality: Midline Cleanser: Byram Ancillary Kit - 15 Day Supply (Generic) 1 x Per Day/30 Days Discharge Instructions: Use supplies as instructed; Kit contains: (15) Saline Bullets; (15) 3x3 Gauze; 15 pr Gloves Cleanser: Soap and Water 1 x Per Day/30 Days Discharge Instructions: Gently cleanse wound with antibacterial soap, rinse and pat dry prior to dressing wounds Prim Dressing: Silvercel Small 2x2 (in/in) (Generic) 1 x Per Day/30 Days ary Discharge Instructions: Apply Silvercel Small 2x2 (in/in) as instructed Secondary Dressing: (BORDER) Zetuvit Plus SILICONE BORDER Dressing 4x4 (in/in) (Generic) 1 x Per Day/30 Days 1. Based on what I am seeing I do believe with regard to the fungal infection it would be beneficial for the patient to go ahead and continue to monitor for any signs of infection or worsening. Based on what I am seeing I do believe that we she go ahead and initiate treatment with the antifungal/nystatin powder. I think this is gena be a good option for him currently and I think the sooner we get that moved along the better. 2. I am  going to recommend based on what I see that the patient should continue to use the silver alginate dressing this is done well for him in the past. 3. I am also going to recommend that the patient should continue to utilize the bordered foam dressing to cover at least during the day and night where when he is in the bed he can use the nystatin powder that I would have sent in for him. We will see patient back for reevaluation in 1 week here in the clinic. If anything worsens or changes patient will contact our office for additional recommendations. Electronic Signature(s) Signed: 10/18/2023 3:50:53 PM By: Allen Derry PA-C Previous Signature: 10/18/2023 3:45:47 PM Version By: Allen Derry PA-C Entered By: Allen Derry on 10/18/2023  15:50:53 -------------------------------------------------------------------------------- SuperBill Details Patient Name: Date of Service: [name]. 10/18/2023 Medical Record Number: [id number] Patient Account Number: [id number] Date of Birth/Sex: Treating RN: 1971-03-01 (52 y.o. [name] Primary Care Provider: Dale Lancaster Other Clinician: Referring Provider: Treating Provider/Extender: Antony Blackbird Weeks in Treatment: [address] [name] ([id number]) [id number] Page 10 of 10 Diagnosis Coding ICD-10 Codes Code Description L89.153 Pressure ulcer of sacral region, stage 3 G82.20 Paraplegia, unspecified E10.621 Type 1 diabetes mellitus with foot ulcer I50.42 Chronic combined systolic (congestive) and diastolic (congestive) heart failure Facility Procedures : CPT4 Code: 78295621 Description: 30865 - WOUND CARE VISIT-LEV 2 EST PT Modifier: Quantity: 1 Physician Procedures : CPT4 Code Description Modifier [id number] - WC PHYS LEVEL 4 - EST PT ICD-10 Diagnosis Description L89.153 Pressure ulcer of sacral region, stage 3 G82.20 Paraplegia, unspecified E10.621 Type 1 diabetes mellitus with foot ulcer I50.42 Chronic  combined systolic (congestive) and diastolic (congestive) heart failure Quantity: 1 Electronic Signature(s) Signed: 10/18/2023 3:51:49 PM By: Allen Derry PA-C Previous Signature: 10/18/2023 3:49:26 PM Version By: Allen Derry PA-C Entered By: Allen Derry on 10/18/2023 15:51:48

## 2023-10-26 ENCOUNTER — Encounter: Payer: Medicare HMO | Admitting: Internal Medicine

## 2023-10-27 DIAGNOSIS — G8220 Paraplegia, unspecified: Secondary | ICD-10-CM | POA: Diagnosis not present

## 2023-11-01 DIAGNOSIS — Z87440 Personal history of urinary (tract) infections: Secondary | ICD-10-CM | POA: Diagnosis not present

## 2023-11-01 DIAGNOSIS — E109 Type 1 diabetes mellitus without complications: Secondary | ICD-10-CM | POA: Diagnosis not present

## 2023-11-01 DIAGNOSIS — R339 Retention of urine, unspecified: Secondary | ICD-10-CM | POA: Diagnosis not present

## 2023-11-01 DIAGNOSIS — G8250 Quadriplegia, unspecified: Secondary | ICD-10-CM | POA: Diagnosis not present

## 2023-11-02 ENCOUNTER — Encounter: Payer: Self-pay | Admitting: Internal Medicine

## 2023-11-02 ENCOUNTER — Ambulatory Visit: Payer: Medicare Other | Attending: Internal Medicine | Admitting: Internal Medicine

## 2023-11-02 VITALS — BP 142/86 | HR 52 | Ht 72.0 in

## 2023-11-02 DIAGNOSIS — I5032 Chronic diastolic (congestive) heart failure: Secondary | ICD-10-CM
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
## 2023-11-02 NOTE — Patient Instructions (Addendum)
Medication Instructions:  - Your physician recommends that you continue on your current medications as directed. Please refer to the Current Medication list given to you today.  *If you need a refill on your cardiac medications before your next appointment, please call your pharmacy*   Lab Work: - none ordered  If you have labs (blood work) drawn today and your tests are completely normal, you will receive your results only by: MyChart Message (if you have MyChart) OR A paper copy in the mail If you have any lab test that is abnormal or we need to change your treatment, we will call you to review the results.   Testing/Procedures: - none ordered   Follow-Up: At [facility], you and your health needs are our priority.  As part of our continuing mission to provide you with exceptional heart care, we have created designated Provider Care Teams.  These Care Teams include your primary Cardiologist (physician) and Advanced Practice Providers (APPs -  Physician Assistants and Nurse Practitioners) who all work together to provide you with the care you need, when you need it.  We recommend signing up for the patient portal called "MyChart".  Sign up information is provided on this After Visit Summary.  MyChart is used to connect with patients for Virtual Visits (Telemedicine).  Patients are able to view lab/test results, encounter notes, upcoming appointments, etc.  Non-urgent messages can be sent to your provider as well.   To learn more about what you can do with MyChart, go to [email or web address].    Your next appointment:   As needed   Provider:   Sherryl Manges, MD    Other Instructions Post Loop Recorder implant instructions:  1) You may shower tomorrow night 2) You may remove your tegaderm (top) dressing on Friday 11/05/23 3) You may remove your steri strips on Sunday 11/07/23 if they have not fallen off on their own. 4) If you have any bleeding issues or concerns with  your implant site after getting home, please call our Device Clinic directly at [phone].

## 2023-11-12 ENCOUNTER — Other Ambulatory Visit: Payer: Self-pay | Admitting: Internal Medicine

## 2023-11-15 ENCOUNTER — Encounter: Payer: Medicare Other | Attending: Physician Assistant | Admitting: Physician Assistant

## 2023-11-15 DIAGNOSIS — G8254 Quadriplegia, C5-C7 incomplete: Secondary | ICD-10-CM | POA: Diagnosis not present

## 2023-11-15 DIAGNOSIS — T23261A Burn of second degree of back of right hand, initial encounter: Secondary | ICD-10-CM | POA: Diagnosis not present

## 2023-11-15 DIAGNOSIS — G8929 Other chronic pain: Secondary | ICD-10-CM | POA: Insufficient documentation

## 2023-11-15 DIAGNOSIS — I5042 Chronic combined systolic (congestive) and diastolic (congestive) heart failure: Secondary | ICD-10-CM | POA: Diagnosis not present

## 2023-11-15 DIAGNOSIS — I110 Hypertensive heart disease with heart failure: Secondary | ICD-10-CM | POA: Diagnosis not present

## 2023-11-15 DIAGNOSIS — L89153 Pressure ulcer of sacral region, stage 3: Secondary | ICD-10-CM | POA: Diagnosis not present

## 2023-11-15 DIAGNOSIS — E10622 Type 1 diabetes mellitus with other skin ulcer: Secondary | ICD-10-CM | POA: Insufficient documentation
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
## 2023-11-16 NOTE — Progress Notes (Signed)
[name], [name] ([id number]) [id number] Page 1 of 10 Visit Report for 11/15/2023 Arrival Information Details Patient Name: Date of Service: [name], [name]. 11/15/2023 2:30 PM Medical Record Number: [id number] Patient Account Number: [id number] Date of Birth/Sex: Treating RN: 16-Aug-1971 (52 y.o. [name]) [name] Primary Care [name]: [name] Other Clinician: Referring [name]: Treating Nieves Barberi/Extender: [name]: 102 Visit Information History Since Last Visit Added or deleted any medications: No Patient Arrived: Ambulatory Any new allergies or adverse reactions: No Arrival Time: 14:41 Had a fall or experienced change in No Accompanied By: self activities of daily living that may affect Transfer Assistance: None risk of falls: Patient Identification Verified: Yes Signs or symptoms of abuse/neglect since last visito No Secondary Verification Process Completed: Yes Hospitalized since last visit: No Patient Requires Transmission-Based Precautions: No Implantable device outside of the clinic excluding No Patient Has Alerts: No cellular tissue based products placed in the center since last visit: Has Dressing in Place as Prescribed: Yes Pain Present Now: No Electronic Signature(s) Signed: 11/16/2023 3:25:36 PM By: [name] Pax RN Entered By: [name] on 11/15/2023 11:42:22 -------------------------------------------------------------------------------- Clinic Level of Care Assessment Details Patient Name: Date of Service: [name], [name] 11/15/2023 2:30 PM Medical Record Number: [id number] Patient Account Number: [id number] Date of Birth/Sex: Treating RN: 10/27/1971 (52 y.o. [name]) [name] Primary Care [name]: [name] Other Clinician: Referring [name]: Treating [name] Weeks in [name]: 102 Clinic Level of Care Assessment Items TOOL 4 Quantity  Score X- 1 0 Use when only an EandM is performed on FOLLOW-UP visit ASSESSMENTS - Nursing Assessment / Reassessment X- 1 10 Reassessment of Co-morbidities (includes updates in patient status) X- 1 5 Reassessment of Adherence to [name] Plan [name] ([id number]) [phone].pdf Page 2 of 10 ASSESSMENTS - Wound and Skin A ssessment / Reassessment []  - Simple Wound Assessment / Reassessment - one wound 0 X- 2 5 Complex Wound Assessment / Reassessment - multiple wounds []  - 0 Dermatologic / Skin Assessment (not related to wound area) ASSESSMENTS - Focused Assessment []  - 0 Circumferential Edema Measurements - multi extremities []  - 0 Nutritional Assessment / Counseling / Intervention []  - 0 Lower Extremity Assessment (monofilament, tuning fork, pulses) []  - 0 Peripheral Arterial Disease Assessment (using hand held doppler) ASSESSMENTS - Ostomy and/or Continence Assessment and Care []  - 0 Incontinence Assessment and Management []  - 0 Ostomy Care Assessment and Management (repouching, etc.) PROCESS - Coordination of Care X - Simple Patient / Family Education for ongoing care 1 15 []  - 0 Complex (extensive) Patient / Family Education for ongoing care []  - 0 Staff obtains Chiropractor, Records, T Results / Process Orders est []  - 0 Staff telephones HHA, Nursing Homes / Clarify orders / etc []  - 0 Routine Transfer to another Facility (non-emergent condition) []  - 0 Routine Hospital Admission (non-emergent condition) []  - 0 New Admissions / Manufacturing engineer / Ordering NPWT Apligraf, etc. , []  - 0 Emergency Hospital Admission (emergent condition) X- 1 10 Simple Discharge Coordination []  - 0 Complex (extensive) Discharge Coordination PROCESS - Special Needs []  - 0 Pediatric / Minor Patient Management []  - 0 Isolation Patient Management []  - 0 Hearing / Language / Visual special needs []  - 0 Assessment of Community assistance  (transportation, D/C planning, etc.) []  - 0 Additional assistance / Altered mentation []  - 0 Support Surface(s) Assessment (bed, cushion, seat, etc.) INTERVENTIONS - Wound Cleansing / Measurement X - Simple Wound Cleansing - one  wound 1 5 []  - 0 Complex Wound Cleansing - multiple wounds X- 1 5 Wound Imaging (photographs - any number of wounds) []  - 0 Wound Tracing (instead of photographs) X- 1 5 Simple Wound Measurement - one wound []  - 0 Complex Wound Measurement - multiple wounds INTERVENTIONS - Wound Dressings X - Small Wound Dressing one or multiple wounds 2 10 []  - 0 Medium Wound Dressing one or multiple wounds []  - 0 Large Wound Dressing one or multiple wounds []  - 0 Application of Medications - topical []  - 0 Application of Medications - injection INTERVENTIONS - Miscellaneous []  - 0 External ear exam [name] ([id number]) [id number] Page 3 of 10 []  - 0 Specimen Collection (cultures, biopsies, blood, body fluids, etc.) []  - 0 Specimen(s) / Culture(s) sent or taken to Lab for analysis []  - 0 Patient Transfer (multiple staff / Michiel Sites Lift / Similar devices) []  - 0 Simple Staple / Suture removal (25 or less) []  - 0 Complex Staple / Suture removal (26 or more) []  - 0 Hypo / Hyperglycemic Management (close monitor of Blood Glucose) []  - 0 Ankle / Brachial Index (ABI) - do not check if billed separately X- 1 5 Vital Signs Has the patient been seen at the hospital within the last three years: Yes Total Score: 90 Level Of Care: New/Established - Level 3 Electronic Signature(s) Signed: 11/16/2023 3:25:36 PM By: [name] Pax RN Entered By: [name] on 11/15/2023 12:02:33 -------------------------------------------------------------------------------- Encounter Discharge Information Details Patient Name: Date of Service: [name] Lean EN K. 11/15/2023 2:30 PM Medical Record Number: [id number] Patient Account Number:  [id number] Date of Birth/Sex: Treating RN: 06-10-1971 (52 y.o. [name] Primary Care [name]: [name] Other Clinician: Referring [name]: Treating [name]/Extender: [name]: 102 Encounter Discharge Information Items Discharge Condition: Stable Ambulatory Status: Wheelchair Discharge Destination: Home Transportation: Private Auto Accompanied By: self Schedule Follow-up Appointment: Yes Clinical Summary of Care: Electronic Signature(s) Signed: 11/16/2023 3:25:36 PM By: [name] Pax RN Entered By: [name] on 11/15/2023 12:05:05 -------------------------------------------------------------------------------- Lower Extremity Assessment Details Patient Name: Date of Service: [name], [name] EN K. 11/15/2023 2:30 PM Brunner, Hillery Hunter ([id number]) [id number] Page 4 of 10 Medical Record Number: [id number] Patient Account Number: [id number] Date of Birth/Sex: Treating RN: [birth year] (52 y.o. [name]) [name] Primary Care Cornie Herrington: [name] [place] Other Clinician: Referring Mikayla Chiusano: Treating Wally Behan/Extender: [name]: 102 Electronic Signature(s) Signed: 11/16/2023 3:25:36 PM By: [name] Pax RN Entered By: [name] on 11/15/2023 11:47:58 -------------------------------------------------------------------------------- Multi Wound Chart Details Patient Name: Date of Service: [name] Lean EN K. 11/15/2023 2:30 PM Medical Record Number: [id number] Patient Account Number: [id number] Date of Birth/Sex: Treating RN: October 08, 1971 (52 y.o. [name] Primary Care Kinzly Pierrelouis: [name] McKenna Other Clinician: Referring Berma Harts: Treating Audyn Dimercurio/Extender: [name]: 102 Vital Signs Height(in): 72 Pulse(bpm): 51 Weight(lbs): 175 Blood Pressure(mmHg): 183/91 Body Mass Index(BMI): 23.7 Temperature(F): 97.6 Respiratory  Rate(breaths/min): 18 [8:Photos: No Photos Midline Sacrum Wound Location: Gradually Appeared Wounding Event: Pressure Ulcer Primary Etiology: 05/07/2021 Date Acquired: 102 Weeks of [name]: Open Wound Status: No Wound Recurrence: 2x2x0.3 Measurements L x W x D (cm) 3.142 A (cm) : rea  0.942 Volume (cm) : 42.90% % Reduction in A rea: 57.20% % Reduction in Volume: Category/Stage III Classification: Medium Exudate A mount: Serosanguineous Exudate Type: red, brown Exudate Color:] [N/A:N/A N/A N/A N/A N/A N/A N/A N/A N/A N/A N/A N/A N/A  N/A N/A N/A N/A] [name] Notes Electronic Signature(s)  Signed: 11/16/2023 3:25:36 PM By: [name] Pax RN Entered By: [name] on 11/15/2023 11:48:03 Ruffins, Hillery Hunter ([id number]) [id number] Page 5 of 10 -------------------------------------------------------------------------------- Multi-Disciplinary Care Plan Details Patient Name: Date of Service: [name], KILCULLEN. 11/15/2023 2:30 PM Medical Record Number: [id number] Patient Account Number: [id number] Date of Birth/Sex: Treating RN: [birth year] (52 y.o. [name]) [name] Primary Care Tiamarie Furnari: [name] [place] Other Clinician: Referring Antero Derosia: Treating Natthew Marlatt/Extender: [name]: 102 Active Inactive Wound/Skin Impairment Nursing Diagnoses: Knowledge deficit related to ulceration/compromised skin integrity Goals: Patient/caregiver will verbalize understanding of skin care regimen Date Initiated: 11/27/2021 Target Resolution Date: 11/28/2022 Goal Status: Active Ulcer/skin breakdown will have a volume reduction of 30% by week 4 Date Initiated: 11/27/2021 Date Inactivated: 03/29/2023 Target Resolution Date: 01/28/2022 Unmet Reason: comorbidities/non Goal Status: Unmet compliance Ulcer/skin breakdown will have a volume reduction of 50% by week 8 Date Initiated: 11/27/2021 Date Inactivated: 03/29/2023 Target Resolution Date:  02/25/2022 Unmet Reason: comorbidities/non Goal Status: Unmet compliance Ulcer/skin breakdown will have a volume reduction of 80% by week 12 Date Initiated: 11/27/2021 Date Inactivated: 03/29/2023 Target Resolution Date: 03/28/2022 Unmet Reason: comorbidities/non Goal Status: Unmet compliance Ulcer/skin breakdown will heal within 14 weeks Date Initiated: 11/27/2021 Date Inactivated: 05/25/2023 Target Resolution Date: 04/27/2022 Unmet Reason: comorbidities/non Goal Status: Unmet compliance Interventions: Assess patient/caregiver ability to obtain necessary supplies Assess patient/caregiver ability to perform ulcer/skin care regimen upon admission and as needed Assess ulceration(s) every visit Notes: Electronic Signature(s) Signed: 11/16/2023 3:25:36 PM By: [name] Pax RN Entered By: [name] on 11/15/2023 11:48:25 Olberding, Hillery Hunter ([id number]) [id number] Page 6 of 10 -------------------------------------------------------------------------------- Pain Assessment Details Patient Name: Date of Service: AVYUKTH, LEYTON 11/15/2023 2:30 PM Medical Record Number: [id number] Patient Account Number: [id number] Date of Birth/Sex: Treating RN: 1971-10-14 (52 y.o. [name]) [name] Primary Care Dyland Panuco: [name] [place] Other Clinician: Referring Leshay Desaulniers: Treating Adit Riddles/Extender: [name]: [phone] Active Problems Location of Pain Severity and Description of Pain Patient Has Paino No Site Locations Pain Management and Medication Current Pain Management: Electronic Signature(s) Signed: 11/16/2023 3:25:36 PM By: [name] Pax RN Entered By: [name] on 11/15/2023 11:43:07 -------------------------------------------------------------------------------- Patient/Caregiver Education Details Patient Name: Date of Service: [name] 12/9/2024andnbsp2:30 PM Medical Record Number: [id number] Patient Account Number:  [id number] Date of Birth/Gender: Treating RN: December 05, 1971 (52 y.o. [name]) [name] Primary Care Physician: [name] [place] Other Clinician: Referring Physician: Treating Physician/Extender: [name]: 102 Education Assessment Education Provided To: Patient Education Topics Provided Wound/Skin Impairment: Handouts: Caring for Your Ulcer Methods: Explain/Verbal Responses: State content correctly [name], [name] ([id number]) [phone].pdf Page 7 of 10 Electronic Signature(s) Signed: 11/16/2023 3:25:36 PM By: [name] Pax RN Entered By: [name] on 11/15/2023 11:48:41 -------------------------------------------------------------------------------- Wound Assessment Details Patient Name: Date of Service: [name], [name] 11/15/2023 2:30 PM Medical Record Number: [id number] Patient Account Number: [id number] Date of Birth/Sex: Treating RN: 05-Apr-1971 (52 y.o. [name] Primary Care Baljit Liebert: [name] [place] Other Clinician: Referring Aasha Dina: Treating Mead Slane/Extender: [name]: 102 Wound Status Wound Number: 10 Primary 2nd degree Burn Etiology: Wound Location: Right Hand - Dorsum Wound Open Wounding Event: Thermal Burn Status: Date Acquired: 11/11/2023 Comorbid Congestive Heart Failure, Type I Diabetes, History of pressure Weeks Of [name]: 0 History: wounds, Paraplegia Clustered Wound: No Photos Wound Measurements Length: (cm) 5 Width: (cm) 4.5 Depth: (cm) 0.1 Area: (cm) 17.671 Volume: (cm) 1.767 % Reduction in Area: % Reduction in Volume: Epithelialization: None Tunneling:  No Undermining: No Wound Description Classification: Full Thickness Without Exposed Support Exudate Amount: Medium Exudate Type: Serosanguineous Exudate Color: red, brown Structures Foul Odor After Cleansing: No Slough/Fibrino Yes Wound Bed Granulation Amount: Large (67-100%)  Exposed Structure Necrotic Amount: None Present (0%) Fascia Exposed: No Fat Layer (Subcutaneous Tissue) Exposed: Yes Tendon Exposed: No Muscle Exposed: No Joint Exposed: No Bone Exposed: No Couey, Hillery Hunter ([id number]) [id number] Page 8 of 10 [name] Notes Wound #10 (Hand - Dorsum) Wound Laterality: Right Cleanser Soap and Water Discharge Instruction: Gently cleanse wound with antibacterial soap, rinse and pat dry prior to dressing wounds Peri-Wound Care Topical Primary Dressing Xeroform 4x4-HBD (in/in) Discharge Instruction: Apply Xeroform 4x4-HBD (in/in) as directed Secondary Dressing Kerlix 4.5 x 4.1 (in/yd) Discharge Instruction: Apply Kerlix 4.5 x 4.1 (in/yd) as instructed Secured With Medipore T - 70M Medipore H Soft Cloth Surgical T ape ape, 2x2 (in/yd) Compression Wrap Compression Stockings Add-Ons Electronic Signature(s) Signed: 11/16/2023 3:25:36 PM By: [name] Pax RN Entered By: [name] on 11/15/2023 12:29:51 -------------------------------------------------------------------------------- Wound Assessment Details Patient Name: Date of Service: [name]. 11/15/2023 2:30 PM Medical Record Number: [id number] Patient Account Number: [id number] Date of Birth/Sex: Treating RN: May 28, 1971 (52 y.o. [name] Primary Care Emogene Mur[place]alla: [name] [place] Other Clinician: Referring Candiace West: Treating Kendarius Vigen/Extender: [name]: 102 Wound Status Wound Number: 8 Primary Pressure Ulcer Etiology: Wound Location: Midline Sacrum Wound Open Wounding Event: Gradually Appeared Status: Date Acquired: 05/07/2021 Comorbid Congestive Heart Failure, Type I Diabetes, History of pressure Weeks Of [name]: 102 History: wounds, Paraplegia Clustered Wound: No Photos [name], [name] ([id number]) [phone].pdf Page 9 of 10 Wound Measurements Length: (cm) 2 Width: (cm) 2 Depth:  (cm) 0.3 Area: (cm) 3.142 Volume: (cm) 0.942 % Reduction in Area: 42.9% % Reduction in Volume: 57.2% Epithelialization: None Wound Description Classification: Category/Stage III Wound Margin: Thickened Exudate Amount: Medium Exudate Type: Serosanguineous Exudate Color: red, brown Foul Odor After Cleansing: No Slough/Fibrino No Wound Bed Granulation Amount: Large (67-100%) Exposed Structure Granulation Quality: Red Fascia Exposed: No Necrotic Amount: None Present (0%) Fat Layer (Subcutaneous Tissue) Exposed: Yes Tendon Exposed: No Muscle Exposed: No Joint Exposed: No Bone Exposed: No [name] Notes Wound #8 (Sacrum) Wound Laterality: Midline Cleanser [place] Ancillary Kit - 15 Day Supply Discharge Instruction: Use supplies as instructed; Kit contains: (15) Saline Bullets; (15) 3x3 Gauze; 15 pr Gloves Soap and Water Discharge Instruction: Gently cleanse wound with antibacterial soap, rinse and pat dry prior to dressing wounds Peri-Wound Care Topical Primary Dressing Silvercel Small 2x2 (in/in) Discharge Instruction: Apply Silvercel Small 2x2 (in/in) as instructed Secondary Dressing (BORDER) Zetuvit Plus SILICONE BORDER Dressing 4x4 (in/in) Secured With Compression Wrap Compression Stockings Add-Ons Electronic Signature(s) Signed: 11/16/2023 3:25:36 PM By: [name] Pax RN Entered By: [name] on 11/15/2023 12:30:12 -------------------------------------------------------------------------------- Vitals Details Patient Name: Date of Service: [name] Lean EN K. 11/15/2023 2:30 PM Medical Record Number: [id number] Patient Account Number: [id number] Date of Birth/Sex: Treating RN: 1971-05-17 (52 y.o. [name] Primary Care Maliya Marich: [name] [place] Other Clinician: Dian Situ ([id number]) [id number] Page 10 of 10 Referring Briya Lookabaugh: Treating Madhuri Vacca/Extender: [name]: 102 Vital Signs Time  Taken: 14:42 Temperature (F): 97.6 Height (in): 72 Pulse (bpm): 51 Weight (lbs): 175 Respiratory Rate (breaths/min): 18 Body Mass Index (BMI): 23.7 Blood Pressure (mmHg): 183/91 Reference Range: 80 - 120 mg / dl Electronic Signature(s) Signed: 11/16/2023 3:25:36 PM By: [name] Pax RN Entered By: [name] on 11/15/2023 11:42:55

## 2023-11-25 ENCOUNTER — Ambulatory Visit: Payer: Medicare Other | Admitting: Urology

## 2023-12-13 ENCOUNTER — Ambulatory Visit: Payer: Medicare Other | Admitting: Physician Assistant

## 2023-12-15 DIAGNOSIS — L98499 Non-pressure chronic ulcer of skin of other sites with unspecified severity: Secondary | ICD-10-CM | POA: Diagnosis not present

## 2023-12-15 DIAGNOSIS — E1069 Type 1 diabetes mellitus with other specified complication: Secondary | ICD-10-CM | POA: Diagnosis not present

## 2023-12-15 DIAGNOSIS — M654 Radial styloid tenosynovitis [de Quervain]: Secondary | ICD-10-CM | POA: Diagnosis not present

## 2023-12-20 ENCOUNTER — Encounter: Payer: Medicare Other | Attending: Physician Assistant | Admitting: Physician Assistant

## 2023-12-20 DIAGNOSIS — L89153 Pressure ulcer of sacral region, stage 3: Secondary | ICD-10-CM | POA: Diagnosis not present

## 2023-12-20 DIAGNOSIS — I110 Hypertensive heart disease with heart failure: Secondary | ICD-10-CM | POA: Diagnosis not present

## 2023-12-20 DIAGNOSIS — G8254 Quadriplegia, C5-C7 incomplete: Secondary | ICD-10-CM | POA: Diagnosis not present

## 2023-12-20 DIAGNOSIS — T23261A Burn of second degree of back of right hand, initial encounter: Secondary | ICD-10-CM | POA: Insufficient documentation

## 2023-12-20 DIAGNOSIS — I5042 Chronic combined systolic (congestive) and diastolic (congestive) heart failure: Secondary | ICD-10-CM | POA: Diagnosis not present

## 2023-12-20 DIAGNOSIS — E1051 Type 1 diabetes mellitus with diabetic peripheral angiopathy without gangrene: Secondary | ICD-10-CM | POA: Diagnosis not present

## 2023-12-20 DIAGNOSIS — Z5986 Financial insecurity: Secondary | ICD-10-CM | POA: Insufficient documentation

## 2023-12-20 DIAGNOSIS — E10622 Type 1 diabetes mellitus with other skin ulcer: Secondary | ICD-10-CM | POA: Insufficient documentation

## 2023-12-21 ENCOUNTER — Other Ambulatory Visit: Payer: Self-pay | Admitting: Internal Medicine
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
## 2023-12-22 NOTE — Progress Notes (Signed)
 [name] ([id number]) [id number] Page 1 of 10 Visit Report for 12/20/2023 Arrival Information Details Patient Name: Date of Service: [name], [name]. 12/20/2023 2:30 PM Medical Record Number: [id number] Patient Account Number: [id number] Date of Birth/Sex: Treating RN: Oct 07, 1971 (52 y.o. [name]) [name] Primary Care [name]: [name] [name]: [name] [name]: [name] [name]/Extender: [name] [name] Weeks in Treatment: 107 Visit Information History Since Last Visit Added or deleted any medications: No Patient Arrived: Wheel Chair Any new allergies or adverse reactions: No Arrival Time: 14:39 Had a fall or experienced change in No Accompanied By: self activities of daily living that may affect Transfer Assistance: None risk of falls: Patient Identification Verified: Yes Signs or symptoms of abuse/neglect since last visito No Secondary Verification Process Completed: Yes Hospitalized since last visit: No Patient Requires Transmission-Based Precautions: No Implantable device outside of the clinic excluding No Patient Has Alerts: No cellular tissue based products placed in the center since last visit: Has Dressing in Place as Prescribed: Yes Pain Present Now: No Electronic Signature(s) Signed: 12/20/2023 4:05:20 PM By: [name] Sailors RN Entered By: [name] on 12/20/2023 14:39:50 -------------------------------------------------------------------------------- Clinic Level of Care Assessment Details Patient Name: Date of Service: [name], [name] 12/20/2023 2:30 PM Medical Record Number: [id number] Patient Account Number: [id number] Date of Birth/Sex: Treating RN: 06/24/1971 (52 y.o. [name]) [name] Primary Care [name]: [name] [name]: [name] [name]: [name] Dyllan Hughett/Extender: [name] [name] Weeks in Treatment: 107 Clinic Level of Care Assessment Items TOOL 4 Quantity  Score X- 1 0 Use when only an EandM is performed on FOLLOW-UP visit ASSESSMENTS - Nursing Assessment / Reassessment X- 1 10 Reassessment of Co-morbidities (includes updates in patient status) X- 1 5 Reassessment of Adherence to Treatment Plan [name] ([id number]) [id number] Page 2 of 10 ASSESSMENTS - Wound and Skin A ssessment / Reassessment []  - Simple Wound Assessment / Reassessment - one wound 0 X- 2 5 Complex Wound Assessment / Reassessment - multiple wounds []  - 0 Dermatologic / Skin Assessment (not related to wound area) ASSESSMENTS - Focused Assessment []  - 0 Circumferential Edema Measurements - multi extremities []  - 0 Nutritional Assessment / Counseling / Intervention []  - 0 Lower Extremity Assessment (monofilament, tuning fork, pulses) []  - 0 Peripheral Arterial Disease Assessment (using hand held doppler) ASSESSMENTS - Ostomy and/or Continence Assessment and Care []  - 0 Incontinence Assessment and Management []  - 0 Ostomy Care Assessment and Management (repouching, etc.) PROCESS - Coordination of Care X - Simple Patient / Family Education for ongoing care 1 15 []  - 0 Complex (extensive) Patient / Family Education for ongoing care []  - 0 Staff obtains Chiropractor, Records, T Results / Process Orders est []  - 0 Staff telephones HHA, Nursing Homes / Clarify orders / etc []  - 0 Routine Transfer to another Facility (non-emergent condition) []  - 0 Routine Hospital Admission (non-emergent condition) []  - 0 New Admissions / Manufacturing Engineer / Ordering NPWT Apligraf, etc. , []  - 0 Emergency Hospital Admission (emergent condition) []  - 0 Simple Discharge Coordination []  - 0 Complex (extensive) Discharge Coordination PROCESS - Special Needs []  - 0 Pediatric / Minor Patient Management []  - 0 Isolation Patient Management []  - 0 Hearing / Language / Visual special needs []  - 0 Assessment of Community assistance  (transportation, D/C planning, etc.) []  - 0 Additional assistance / Altered mentation []  - 0 Support Surface(s) Assessment (bed, cushion, seat, etc.) INTERVENTIONS - Wound Cleansing / Measurement []  - 0 Simple Wound Cleansing -  one wound X- 2 5 Complex Wound Cleansing - multiple wounds X- 1 5 Wound Imaging (photographs - any number of wounds) []  - 0 Wound Tracing (instead of photographs) []  - 0 Simple Wound Measurement - one wound []  - 0 Complex Wound Measurement - multiple wounds INTERVENTIONS - Wound Dressings X - Small Wound Dressing one or multiple wounds 1 10 []  - 0 Medium Wound Dressing one or multiple wounds []  - 0 Large Wound Dressing one or multiple wounds []  - 0 Application of Medications - topical []  - 0 Application of Medications - injection INTERVENTIONS - Miscellaneous []  - 0 External ear exam [name] ([id number]) [id number] Page 3 of 10 []  - 0 Specimen Collection (cultures, biopsies, blood, body fluids, etc.) []  - 0 Specimen(s) / Culture(s) sent or taken to Lab for analysis []  - 0 Patient Transfer (multiple staff / Deitra Lift / Similar devices) []  - 0 Simple Staple / Suture removal (25 or less) []  - 0 Complex Staple / Suture removal (26 or more) []  - 0 Hypo / Hyperglycemic Management (close monitor of Blood Glucose) []  - 0 Ankle / Brachial Index (ABI) - do not check if billed separately X- 1 5 Vital Signs Has the patient been seen at the hospital within the last three years: Yes Total Score: 70 Level Of Care: New/Established - Level 2 Electronic Signature(s) Signed: 12/20/2023 4:05:20 PM By: [name] Sailors RN Entered By: [name] on 12/20/2023 15:22:53 -------------------------------------------------------------------------------- Encounter Discharge Information Details Patient Name: Date of Service: [name]. 12/20/2023 2:30 PM Medical Record Number: [id number] Patient Account Number:  [id number] Date of Birth/Sex: Treating RN: 1971-06-29 (52 y.o. [name] [name] Primary Care [name]: [name] [name]: [name] [name]: [name] [name]/Extender: [name] [name] Weeks in Treatment: 107 Encounter Discharge Information Items Discharge Condition: Stable Ambulatory Status: Wheelchair Discharge Destination: Home Transportation: Private Auto Accompanied By: self Schedule Follow-up Appointment: Yes Clinical Summary of Care: Electronic Signature(s) Signed: 12/20/2023 4:05:20 PM By: [name] Sailors RN Entered By: [name] on 12/20/2023 15:24:01 -------------------------------------------------------------------------------- Lower Extremity Assessment Details Patient Name: Date of Service: KAELAN, AMBLE EN [name]. 12/20/2023 2:30 PM Leske, Keniel [name] ([id number]) [id number] Page 4 of 10 Medical Record Number: [id number] Patient Account Number: [id number] Date of Birth/Sex: Treating RN: 1971/08/24 (52 y.o. [name]) [name] Primary Care Nabilah Davoli: [name] [name]: [name] Tajay Muzzy: [name] Nikolos Billig/Extender: [name] [name] Weeks in Treatment: 107 Electronic Signature(s) Signed: 12/20/2023 4:05:20 PM By: [name] Sailors RN Entered By: [name] on 12/20/2023 14:46:13 -------------------------------------------------------------------------------- Multi Wound Chart Details Patient Name: Date of Service: [name]. 12/20/2023 2:30 PM Medical Record Number: [id number] Patient Account Number: [id number] Date of Birth/Sex: Treating RN: [birth year] (52 y.o. [name] [name] Primary Care Nile Prisk: [name] [name]: [name] Sapphire Tygart: [name] Zebediah Beezley/Extender: [name] [name] Weeks in Treatment: 107 Vital Signs Height(in): 72 Pulse(bpm): 59 Weight(lbs): 175 Blood Pressure(mmHg): 148/87 Body Mass Index(BMI): 23.7 Temperature(F): 98.2 Respiratory  Rate(breaths/min): 18 [10:Photos:] [N/A:N/A] Right Hand - Dorsum Midline Sacrum N/A Wound Location: Thermal Burn Gradually Appeared N/A Wounding Event: 2nd degree Burn Pressure Ulcer N/A Primary Etiology: Congestive Heart Failure, Type I Congestive Heart Failure, Type I N/A Comorbid History: Diabetes, History of pressure wounds, Diabetes, History of pressure wounds, Paraplegia Paraplegia 11/11/2023 05/07/2021 N/A Date Acquired: 5 107 N/A Weeks of Treatment: Open Open N/A Wound Status: No No N/A Wound Recurrence: 0x0x0 0.5x1.5x0.2 N/A Measurements L x W x D (cm) 0 0.589 N/A A (cm) : rea 0 0.118 N/A Volume (cm) :  100.00% 89.30% N/A % Reduction in Area: 100.00% 94.60% N/A % Reduction in Volume: Full Thickness Without Exposed Category/Stage III N/A Classification: Support Structures None Present Medium N/A Exudate Amount: N/A Serosanguineous N/A Exudate Type: N/A red, brown N/A Exudate Color: N/A Thickened N/A Wound Margin: None Present (0%) Large (67-100%) N/A Granulation Amount: N/A Red N/A Granulation Quality: None Present (0%) None Present (0%) N/A Necrotic Amount: Fascia: No Fat Layer (Subcutaneous Tissue): Yes N/A Exposed Structures: Fat Layer (Subcutaneous Tissue): No Fascia: No Hsiao, [name] ([id number]) [id number] Page 5 of 10 Tendon: No Tendon: No Muscle: No Muscle: No Joint: No Joint: No Bone: No Bone: No Large (67-100%) Medium (34-66%) N/A Epithelialization: Treatment Notes Electronic Signature(s) Signed: 12/20/2023 4:05:20 PM By: [name] Sailors RN Entered By: [name] on 12/20/2023 14:46:19 -------------------------------------------------------------------------------- Multi-Disciplinary Care Plan Details Patient Name: Date of Service: [name]. 12/20/2023 2:30 PM Medical Record Number: [id number] Patient Account Number: [id number] Date of Birth/Sex: Treating RN: 1971-05-07 (52 y.o. [name] [name] Primary Care Deepak Bless: [name] [name]: [name] Joesiah Lonon: [name] Abrar Koone/Extender: [name] [name] Weeks in Treatment: 107 Active Inactive Wound/Skin Impairment Nursing Diagnoses: Knowledge deficit related to ulceration/compromised skin integrity Goals: Patient/caregiver will verbalize understanding of skin care regimen Date Initiated: 11/27/2021 Target Resolution Date: 01/29/2023 Goal Status: Active Ulcer/skin breakdown will have a volume reduction of 30% by week 4 Date Initiated: 11/27/2021 Date Inactivated: 03/29/2023 Target Resolution Date: 01/28/2022 Unmet Reason: comorbidities/non Goal Status: Unmet compliance Ulcer/skin breakdown will have a volume reduction of 50% by week 8 Date Initiated: 11/27/2021 Date Inactivated: 03/29/2023 Target Resolution Date: 02/25/2022 Unmet Reason: comorbidities/non Goal Status: Unmet compliance Ulcer/skin breakdown will have a volume reduction of 80% by week 12 Date Initiated: 11/27/2021 Date Inactivated: 03/29/2023 Target Resolution Date: 03/28/2022 Unmet Reason: comorbidities/non Goal Status: Unmet compliance Ulcer/skin breakdown will heal within 14 weeks Date Initiated: 11/27/2021 Date Inactivated: 05/25/2023 Target Resolution Date: 04/27/2022 Unmet Reason: comorbidities/non Goal Status: Unmet compliance Interventions: Assess patient/caregiver ability to obtain necessary supplies Assess patient/caregiver ability to perform ulcer/skin care regimen upon admission and as needed Assess ulceration(s) every visit Notes: Electronic Signature(s) Signed: 12/20/2023 4:05:20 PM By: [name] Sailors RN Entered By: [name] on 12/20/2023 14:47:09 Sakamoto, [name] ([id number]) [id number] Page 6 of 10 -------------------------------------------------------------------------------- Pain Assessment Details Patient Name: Date of Service: [name], [name] 12/20/2023 2:30 PM Medical  Record Number: [id number] Patient Account Number: [id number] Date of Birth/Sex: Treating RN: 1971-10-16 (52 y.o. [name]) [name] Primary Care Jovoni Borkenhagen: [name] [name]: [name] Jamarria Real: [name] Sherilynn Dieu/Extender: [name] [name] Weeks in Treatment: 107 Active Problems Location of Pain Severity and Description of Pain Patient Has Paino No Site Locations Pain Management and Medication Current Pain Management: Electronic Signature(s) Signed: 12/20/2023 4:05:20 PM By: [name] Sailors RN Entered By: [name] on 12/20/2023 14:40:20 -------------------------------------------------------------------------------- Patient/Caregiver Education Details Patient Name: Date of Service: [name] MOULDER BENNETTA LOIS 1/13/2025andnbsp2:30 PM Medical Record Number: [id number] Patient Account Number: [id number] Date of Birth/Gender: Treating RN: 06-19-1971 (52 y.o. [name] [name] Primary Care Physician: [name] [name]: [name] Physician: [name] Physician/Extender: [name] [name] Chevalier, Tollie [name] ([id number]) [id number] Page 7 of 10 Weeks in Treatment: 107 Education Assessment Education Provided To: Patient Education Topics Provided Wound/Skin Impairment: Handouts: Caring for Your Ulcer Methods: Explain/Verbal Responses: State content correctly Electronic Signature(s) Signed: 12/20/2023 4:05:20 PM By: [name] Sailors RN Entered By: [name] on 12/20/2023 14:47:21 -------------------------------------------------------------------------------- Wound Assessment Details Patient Name: Date of Service: [name]. 12/20/2023 2:30 PM Medical Record  Number: [id number] Patient Account Number: [id number] Date of Birth/Sex: Treating RN: [birth year] (52 y.o. [name]) [name] Primary Care Chamar Broughton: [name] [name]: [name] Marni Franzoni: [name] Linnell Swords/Extender: [name] [name] Weeks in  Treatment: 107 Wound Status Wound Number: 10 Primary 2nd degree Burn Etiology: Wound Location: Right Hand - Dorsum Wound Open Wounding Event: Thermal Burn Status: Date Acquired: 11/11/2023 Comorbid Congestive Heart Failure, Type I Diabetes, History of pressure Weeks Of Treatment: 5 History: wounds, Paraplegia Clustered Wound: No Photos Wound Measurements Length: (cm) Width: (cm) Depth: (cm) Area: (cm) Volume: (cm) 0 % Reduction in Area: 100% 0 % Reduction in Volume: 100% 0 Epithelialization: Large (67-100%) 0 Tunneling: No 0 Undermining: No Wound Description Classification: Full Thickness Without Exposed Support Hazelip, Zae [name] ([id number]) Exudate Amount: None Present Structures Foul Odor After Cleansing: No [id number] Page 8 of 10 Slough/Fibrino No Wound Bed Granulation Amount: None Present (0%) Exposed Structure Necrotic Amount: None Present (0%) Fascia Exposed: No Fat Layer (Subcutaneous Tissue) Exposed: No Tendon Exposed: No Muscle Exposed: No Joint Exposed: No Bone Exposed: No Treatment Notes Wound #10 (Hand - Dorsum) Wound Laterality: Right Cleanser Soap and Water Discharge Instruction: Gently cleanse wound with antibacterial soap, rinse and pat dry prior to dressing wounds Peri-Wound Care Topical Primary Dressing Xeroform 4x4-HBD (in/in) Discharge Instruction: Apply Xeroform 4x4-HBD (in/in) as directed Secondary Dressing Kerlix 4.5 x 4.1 (in/yd) Discharge Instruction: Apply Kerlix 4.5 x 4.1 (in/yd) as instructed Secured With Medipore T - 81M Medipore H Soft Cloth Surgical T ape ape, 2x2 (in/yd) Compression Wrap Compression Stockings Add-Ons Electronic Signature(s) Signed: 12/20/2023 4:05:20 PM By: [name] Sailors RN Entered By: [name] on 12/20/2023 14:45:46 -------------------------------------------------------------------------------- Wound Assessment Details Patient Name: Date of Service: RAINEN, VANROSSUM  12/20/2023 2:30 PM Medical Record Number: [id number] Patient Account Number: [id number] Date of Birth/Sex: Treating RN: 11-28-1971 (52 y.o. [name] [name] Primary Care Naquan Garman: [name] [name]: [name] Chelli Yerkes: [name] Fawzi Melman/Extender: [name] [name] Weeks in Treatment: 107 Wound Status Wound Number: 8 Primary Pressure Ulcer Etiology: Wound Location: Midline Sacrum Wound Open Wounding Event: Gradually Appeared Status: Date Acquired: 05/07/2021 Comorbid Congestive Heart Failure, Type I Diabetes, History of pressure Weeks Of Treatment: 107 History: wounds, Paraplegia Clustered Wound: No Cullens, [name] ([id number]) [id number] Page 9 of 10 Photos Wound Measurements Length: (cm) 0.5 Width: (cm) 1.5 Depth: (cm) 0.2 Area: (cm) 0.589 Volume: (cm) 0.118 % Reduction in Area: 89.3% % Reduction in Volume: 94.6% Epithelialization: Medium (34-66%) Tunneling: No Undermining: No Wound Description Classification: Category/Stage III Wound Margin: Thickened Exudate Amount: Medium Exudate Type: Serosanguineous Exudate Color: red, brown Foul Odor After Cleansing: No Slough/Fibrino No Wound Bed Granulation Amount: Large (67-100%) Exposed Structure Granulation Quality: Red Fascia Exposed: No Necrotic Amount: None Present (0%) Fat Layer (Subcutaneous Tissue) Exposed: Yes Tendon Exposed: No Muscle Exposed: No Joint Exposed: No Bone Exposed: No Treatment Notes Wound #8 (Sacrum) Wound Laterality: Midline Cleanser Byram Ancillary Kit - 15 Day Supply Discharge Instruction: Use supplies as instructed; Kit contains: (15) Saline Bullets; (15) 3x3 Gauze; 15 pr Gloves Soap and Water Discharge Instruction: Gently cleanse wound with antibacterial soap, rinse and pat dry prior to dressing wounds Peri-Wound Care Topical Primary Dressing Silvercel Small 2x2 (in/in) Discharge Instruction: Apply Silvercel Small 2x2 (in/in) as  instructed Secondary Dressing (BORDER) Zetuvit Plus SILICONE BORDER Dressing 4x4 (in/in) Secured With Compression Wrap Compression Stockings Add-Ons Electronic Signature(s) Signed: 12/20/2023 4:05:20 PM By: [name] Sailors RN Entered By: [name] on 12/20/2023 14:46:04 Korson, [name] ([id number]) [id number] Page 10 of  10 -------------------------------------------------------------------------------- Vitals Details Patient Name: Date of Service: KYL, GIVLER. 12/20/2023 2:30 PM Medical Record Number: [id number] Patient Account Number: [id number] Date of Birth/Sex: Treating RN: 1971-10-03 (52 y.o. [name]) [name] Primary Care Jadrian Bulman: [name] [name]: [name] Kennia Vanvorst: [name] Lasharon Dunivan/Extender: [name] [name] Weeks in Treatment: 107 Vital Signs Time Taken: 14:39 Temperature (F): 98.2 Height (in): 72 Pulse (bpm): 59 Weight (lbs): 175 Respiratory Rate (breaths/min): 18 Body Mass Index (BMI): 23.7 Blood Pressure (mmHg): 148/87 Reference Range: 80 - 120 mg / dl Electronic Signature(s) Signed: 12/20/2023 4:05:20 PM By: [name] Sailors RN Entered By: [name] on 12/20/2023 14:40:14

## 2023-12-23 ENCOUNTER — Encounter: Payer: Self-pay | Admitting: Internal Medicine

## 2023-12-23 MED ORDER — OMEPRAZOLE 20 MG PO CPDR: 20.0000 mg | DELAYED_RELEASE_CAPSULE | Freq: Two times a day (BID) | ORAL | 0 refills | Status: DC

## 2023-12-23 NOTE — Telephone Encounter (Signed)
Pt requesting refill for baclofen.   Omperazole has already been sent to pharmacy

## 2023-12-24 MED ORDER — BACLOFEN 20 MG PO TABS: ORAL_TABLET | ORAL | 0 refills | Status: DC

## 2023-12-24 NOTE — Telephone Encounter (Signed)
I refilled his baclofen x 1.  Needs a f/u appt with me.  Please schedule.

## 2023-12-24 NOTE — Telephone Encounter (Signed)
Duplicate request please refuse

## 2023-12-24 NOTE — Telephone Encounter (Signed)
Please schedule for f/u appt with Dr Lorin Picket.

## 2023-12-24 NOTE — Telephone Encounter (Signed)
Omeprazole and baclofen have already been refilled. He does need a f/u appt with me.

## 2023-12-28 ENCOUNTER — Encounter: Payer: Self-pay | Admitting: Internal Medicine

## 2023-12-28 ENCOUNTER — Ambulatory Visit: Payer: Medicare Other | Admitting: Internal Medicine

## 2023-12-28 VITALS — BP 138/84 | HR 50 | Ht 72.0 in | Wt 160.0 lb

## 2023-12-28 DIAGNOSIS — L98491 Non-pressure chronic ulcer of skin of other sites limited to breakdown of skin: Secondary | ICD-10-CM | POA: Diagnosis not present

## 2023-12-28 DIAGNOSIS — I5032 Chronic diastolic (congestive) heart failure: Secondary | ICD-10-CM | POA: Diagnosis not present

## 2023-12-28 DIAGNOSIS — E109 Type 1 diabetes mellitus without complications: Secondary | ICD-10-CM | POA: Diagnosis not present

## 2023-12-28 DIAGNOSIS — E1069 Type 1 diabetes mellitus with other specified complication: Secondary | ICD-10-CM | POA: Diagnosis not present

## 2023-12-28 DIAGNOSIS — R159 Full incontinence of feces: Secondary | ICD-10-CM | POA: Diagnosis not present

## 2023-12-28 DIAGNOSIS — G903 Multi-system degeneration of the autonomic nervous system: Secondary | ICD-10-CM

## 2023-12-28 DIAGNOSIS — D62 Acute posthemorrhagic anemia: Secondary | ICD-10-CM

## 2023-12-28 DIAGNOSIS — F1029 Alcohol dependence with unspecified alcohol-induced disorder: Secondary | ICD-10-CM

## 2023-12-28 DIAGNOSIS — G8220 Paraplegia, unspecified: Secondary | ICD-10-CM | POA: Diagnosis not present

## 2023-12-28 MED ORDER — OMEPRAZOLE 20 MG PO CPDR: 20.0000 mg | DELAYED_RELEASE_CAPSULE | Freq: Two times a day (BID) | ORAL | 3 refills | Status: DC

## 2023-12-28 MED ORDER — BACLOFEN 20 MG PO TABS: ORAL_TABLET | ORAL | 5 refills | Status: DC

## 2023-12-28 MED ORDER — DOXYCYCLINE HYCLATE 100 MG PO TABS: 100.0000 mg | ORAL_TABLET | Freq: Two times a day (BID) | ORAL | 0 refills | Status: DC

## 2023-12-28 MED ORDER — ROSUVASTATIN CALCIUM 5 MG PO TABS: ORAL_TABLET | ORAL | 3 refills | Status: DC
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
## 2023-12-29 LAB — CBC WITH DIFFERENTIAL/PLATELET
Basophils Absolute: 0 10*3/uL (ref 0.0–0.1)
Basophils Relative: 0.6 % (ref 0.0–3.0)
Eosinophils Absolute: 0.1 10*3/uL (ref 0.0–0.7)
Eosinophils Relative: 1.2 % (ref 0.0–5.0)
HCT: 38.2 % — ABNORMAL LOW (ref 39.0–52.0)
Hemoglobin: 12.8 g/dL — ABNORMAL LOW (ref 13.0–17.0)
Lymphocytes Relative: 9.4 % — ABNORMAL LOW (ref 12.0–46.0)
Lymphs Abs: 0.7 10*3/uL (ref 0.7–4.0)
MCHC: 33.4 g/dL (ref 30.0–36.0)
MCV: 96.3 fL (ref 78.0–100.0)
Monocytes Absolute: 0.3 10*3/uL (ref 0.1–1.0)
Monocytes Relative: 4 % (ref 3.0–12.0)
Neutro Abs: 6.4 10*3/uL (ref 1.4–7.7)
Neutrophils Relative %: 84.8 % — ABNORMAL HIGH (ref 43.0–77.0)
Platelets: 241 10*3/uL (ref 150.0–400.0)
RBC: 3.97 Mil/uL — ABNORMAL LOW (ref 4.22–5.81)
RDW: 13.9 % (ref 11.5–15.5)
WBC: 7.5 10*3/uL (ref 4.0–10.5)

## 2023-12-29 LAB — LIPID PANEL
Cholesterol: 167 mg/dL (ref 0–200)
HDL: 76.5 mg/dL (ref >39.00)
LDL Cholesterol: 80 mg/dL (ref 0–99)
NonHDL: 90.9
Total CHOL/HDL Ratio: 2
Triglycerides: 54 mg/dL (ref 0.0–149.0)
VLDL: 10.8 mg/dL (ref 0.0–40.0)

## 2023-12-29 LAB — BASIC METABOLIC PANEL
BUN: 16 mg/dL (ref 6–23)
CO2: 30 meq/L (ref 19–32)
Calcium: 9.1 mg/dL (ref 8.4–10.5)
Chloride: 93 meq/L — ABNORMAL LOW (ref 96–112)
Creatinine, Ser: 0.74 mg/dL (ref 0.40–1.50)
GFR: 103.25 mL/min (ref >60.00)
Glucose, Bld: 147 mg/dL — ABNORMAL HIGH (ref 70–99)
Potassium: 5.2 meq/L — ABNORMAL HIGH (ref 3.5–5.1)
Sodium: 130 meq/L — ABNORMAL LOW (ref 135–145)

## 2023-12-29 LAB — HEPATIC FUNCTION PANEL
ALT: 18 U/L (ref 0–53)
AST: 26 U/L (ref 0–37)
Albumin: 4 g/dL (ref 3.5–5.2)
Alkaline Phosphatase: 108 U/L (ref 39–117)
Bilirubin, Direct: 0.1 mg/dL (ref 0.0–0.3)
Total Bilirubin: 0.5 mg/dL (ref 0.2–1.2)
Total Protein: 6.7 g/dL (ref 6.0–8.3)

## 2023-12-29 LAB — HEMOGLOBIN A1C: Hgb A1c MFr Bld: 6.4 % (ref 4.6–6.5)

## 2024-01-02 ENCOUNTER — Encounter: Payer: Self-pay | Admitting: Internal Medicine

## 2024-01-02 NOTE — Assessment & Plan Note (Signed)
Follow cbc.

## 2024-01-02 NOTE — Assessment & Plan Note (Signed)
Seeing Dr Tedd Sias. Insulin pump. Last A1c recorded 6.0. continue f/u with endocrinology.

## 2024-01-02 NOTE — Assessment & Plan Note (Signed)
He has cut down alcohol intake.  Discussed.

## 2024-01-02 NOTE — Assessment & Plan Note (Signed)
Saw seen surgery previously. Previous concern regarding a fistula.  Has had persistent rectal issues and persistent problems with sacral wound. Given persistent issues will have evaluation by colorectal surgeon for further evaluation.

## 2024-01-02 NOTE — Assessment & Plan Note (Signed)
Has been followed by cardiology.  Follow.

## 2024-01-02 NOTE — Assessment & Plan Note (Signed)
Has been followed / evaluated - wound clinic. Recent worsening as outlined. Brought in picture of wound. Unable to personally visualized, but did review picture. Increased surrounding erythema. Recently treated with diflucan. Continue nystatin powder. Concern regarding the surrounding erythema - bacterial infection. Treat with doxycycline as directed.  Discussed referral to colorectal surgery as outlined.  Agreeable.  Continue f/u with wound clinic.

## 2024-01-02 NOTE — Assessment & Plan Note (Signed)
Lives by himself.  Independent in ADLs.   ?

## 2024-01-02 NOTE — Assessment & Plan Note (Signed)
Per cardiology.  Has lasix to take as needed.  Follow volume status.  Follow pressures.  Continue f/u with cardiology.

## 2024-01-03 ENCOUNTER — Encounter: Payer: Self-pay | Admitting: Internal Medicine

## 2024-01-04 ENCOUNTER — Other Ambulatory Visit: Payer: Self-pay

## 2024-01-04 DIAGNOSIS — E875 Hyperkalemia: Secondary | ICD-10-CM

## 2024-01-04 NOTE — Telephone Encounter (Signed)
Per result note, described the sacral area as being worse. Need to clarify - what it looks like now. How is it worse?  Also, does he have a f/u with wound clinic and has he heard from Dr Gordy Savers office (the surgeon I referred him to).

## 2024-01-04 NOTE — Telephone Encounter (Signed)
Please call Brett Canales and let him know that wound clinic recommended -  Calmoseptine. They have found this to be very effective. It is available both at [facility] by [address] and on [address]. Also, make sure has f/u with wound clinic. Needs a soon f/u scheduled.

## 2024-01-04 NOTE — Telephone Encounter (Signed)
[name] spoke with patient and sent you a result note on him this morning.

## 2024-01-07 ENCOUNTER — Encounter: Payer: Self-pay | Admitting: Internal Medicine

## 2024-01-10 NOTE — Telephone Encounter (Signed)
Patient following up on surgery referral.

## 2024-01-10 NOTE — Addendum Note (Signed)
Addended by: Rita Ohara D on: 01/10/2024 10:39 AM   Modules accepted: Orders

## 2024-01-14 ENCOUNTER — Other Ambulatory Visit (INDEPENDENT_AMBULATORY_CARE_PROVIDER_SITE_OTHER): Payer: Medicare Other

## 2024-01-14 DIAGNOSIS — E875 Hyperkalemia: Secondary | ICD-10-CM

## 2024-01-15 LAB — POTASSIUM: Potassium: 4.4 mmol/L (ref 3.5–5.3)

## 2024-01-16 ENCOUNTER — Encounter: Payer: Self-pay | Admitting: Internal Medicine

## 2024-01-17 ENCOUNTER — Encounter: Payer: Medicare Other | Attending: Physician Assistant | Admitting: Physician Assistant

## 2024-01-17 DIAGNOSIS — G8220 Paraplegia, unspecified: Secondary | ICD-10-CM | POA: Insufficient documentation

## 2024-01-17 DIAGNOSIS — I5042 Chronic combined systolic (congestive) and diastolic (congestive) heart failure: Secondary | ICD-10-CM | POA: Insufficient documentation

## 2024-01-17 DIAGNOSIS — T23261A Burn of second degree of back of right hand, initial encounter: Secondary | ICD-10-CM | POA: Insufficient documentation

## 2024-01-17 DIAGNOSIS — X58XXXA Exposure to other specified factors, initial encounter: Secondary | ICD-10-CM | POA: Diagnosis not present

## 2024-01-17 DIAGNOSIS — L89153 Pressure ulcer of sacral region, stage 3: Secondary | ICD-10-CM | POA: Diagnosis not present

## 2024-01-17 DIAGNOSIS — E10622 Type 1 diabetes mellitus with other skin ulcer: Secondary | ICD-10-CM | POA: Insufficient documentation

## 2024-01-17 NOTE — Telephone Encounter (Signed)
 Noted.

## 2024-01-19 DIAGNOSIS — E109 Type 1 diabetes mellitus without complications: Secondary | ICD-10-CM | POA: Diagnosis not present

## 2024-01-19 DIAGNOSIS — R339 Retention of urine, unspecified: Secondary | ICD-10-CM | POA: Diagnosis not present

## 2024-01-19 DIAGNOSIS — Z87440 Personal history of urinary (tract) infections: Secondary | ICD-10-CM | POA: Diagnosis not present

## 2024-01-19 DIAGNOSIS — G8250 Quadriplegia, unspecified: Secondary | ICD-10-CM | POA: Diagnosis not present

## 2024-01-20 DIAGNOSIS — G8250 Quadriplegia, unspecified: Secondary | ICD-10-CM | POA: Diagnosis not present

## 2024-01-20 DIAGNOSIS — Z87440 Personal history of urinary (tract) infections: Secondary | ICD-10-CM | POA: Diagnosis not present

## 2024-01-24 DIAGNOSIS — S6432XA Injury of digital nerve of left thumb, initial encounter: Secondary | ICD-10-CM | POA: Diagnosis not present

## 2024-01-24 DIAGNOSIS — G5602 Carpal tunnel syndrome, left upper limb: Secondary | ICD-10-CM | POA: Diagnosis not present

## 2024-02-07 ENCOUNTER — Other Ambulatory Visit: Payer: Self-pay | Admitting: Cardiovascular Disease

## 2024-02-07 ENCOUNTER — Telehealth: Payer: Self-pay | Admitting: Cardiovascular Disease

## 2024-02-07 NOTE — Telephone Encounter (Signed)
 Left voicemail, Pt is due for 6 month follow up and for med refill

## 2024-02-07 NOTE — Telephone Encounter (Signed)
*  STAT* If patient is at the pharmacy, call can be transferred to refill team.   1. Which medications need to be refilled? (please list name of each medication and dose if known) midodrine (PROAMATINE) 5 MG tablet  hydrALAZINE (APRESOLINE) 25 MG tablet   2. Would you like to learn more about the convenience, safety, & potential cost savings by using the [facility]? No    3. Are you open to using the [facility] No  4. Which pharmacy/location (including street and city if local pharmacy) is medication to be sent to? [facility] - [place], [place] - [address]     5. Do they need a 30 day or 90 day supply? 30 Day Supply  Pt is scheduled to see Dr. Mariah Milling on 04/10/24.

## 2024-02-07 NOTE — Telephone Encounter (Signed)
Please contact pt for future appointment. ?Pt due for 6 month f/u. ?Pt needing refills. ?

## 2024-02-07 NOTE — Telephone Encounter (Signed)
 Left voice mail

## 2024-02-08 ENCOUNTER — Encounter: Payer: Self-pay | Admitting: Urology

## 2024-02-08 ENCOUNTER — Ambulatory Visit
Admission: RE | Admit: 2024-02-08 | Discharge: 2024-02-08 | Disposition: A | Discharge status: Home / Self Care | Attending: Urology | Admitting: Urology

## 2024-02-08 ENCOUNTER — Ambulatory Visit
Admission: RE | Admit: 2024-02-08 | Discharge: 2024-02-08 | Disposition: A | Discharge status: Home / Self Care | Source: Ambulatory Visit | Attending: Urology | Admitting: Urology

## 2024-02-08 ENCOUNTER — Ambulatory Visit: Payer: Medicare Other | Admitting: Urology

## 2024-02-08 VITALS — HR 51 | Ht 72.0 in | Wt 160.0 lb

## 2024-02-08 DIAGNOSIS — N390 Urinary tract infection, site not specified: Secondary | ICD-10-CM | POA: Diagnosis not present

## 2024-02-08 DIAGNOSIS — N200 Calculus of kidney: Secondary | ICD-10-CM

## 2024-02-08 DIAGNOSIS — N319 Neuromuscular dysfunction of bladder, unspecified: Secondary | ICD-10-CM | POA: Diagnosis not present

## 2024-02-08 MED ORDER — HYDRALAZINE HCL 25 MG PO TABS: 25.0000 mg | ORAL_TABLET | Freq: Three times a day (TID) | ORAL | 0 refills | Status: DC | PRN

## 2024-02-08 MED ORDER — MIDODRINE HCL 5 MG PO TABS: 5.0000 mg | ORAL_TABLET | Freq: Three times a day (TID) | ORAL | 0 refills | Status: DC | PRN

## 2024-02-08 NOTE — Telephone Encounter (Signed)
 Requested Prescriptions   Signed Prescriptions Disp Refills   midodrine (PROAMATINE) 5 MG tablet 90 tablet 0    Sig: Take 1 tablet (5 mg total) by mouth 3 (three) times daily with meals as needed (low pressure).    Authorizing Provider: Antonieta Iba    Ordering User: Iverson Alamin C   hydrALAZINE (APRESOLINE) 25 MG tablet 90 tablet 0    Sig: Take 1 tablet (25 mg total) by mouth 3 (three) times daily as needed (high blood pressure).    Authorizing Provider: Antonieta Iba    Ordering User: Kendrick Fries
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
## 2024-02-10 ENCOUNTER — Ambulatory Visit: Payer: Medicare Other | Attending: Orthopedic Surgery | Admitting: Occupational Therapy

## 2024-02-10 ENCOUNTER — Encounter: Payer: Self-pay | Admitting: Occupational Therapy

## 2024-02-10 DIAGNOSIS — M25532 Pain in left wrist: Secondary | ICD-10-CM | POA: Diagnosis not present

## 2024-02-10 DIAGNOSIS — G5602 Carpal tunnel syndrome, left upper limb: Secondary | ICD-10-CM | POA: Diagnosis not present

## 2024-02-10 MED ORDER — METHENAMINE HIPPURATE 1 G PO TABS: 1.0000 g | ORAL_TABLET | Freq: Two times a day (BID) | ORAL | 3 refills | Status: DC
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
## 2024-02-14 ENCOUNTER — Ambulatory Visit: Payer: Medicare Other | Admitting: Physician Assistant

## 2024-02-14 NOTE — Telephone Encounter (Signed)
 Pt scheduled on 5/5

## 2024-02-17 DIAGNOSIS — G5602 Carpal tunnel syndrome, left upper limb: Secondary | ICD-10-CM | POA: Diagnosis not present

## 2024-02-17 DIAGNOSIS — M654 Radial styloid tenosynovitis [de Quervain]: Secondary | ICD-10-CM | POA: Diagnosis not present

## 2024-02-18 ENCOUNTER — Encounter: Payer: Self-pay | Admitting: Urology

## 2024-02-22 ENCOUNTER — Ambulatory Visit: Admitting: Physician Assistant

## 2024-02-28 ENCOUNTER — Other Ambulatory Visit: Payer: Self-pay

## 2024-02-28 ENCOUNTER — Ambulatory Visit: Admitting: Urology

## 2024-02-28 ENCOUNTER — Encounter: Payer: Self-pay | Admitting: Urology

## 2024-02-28 ENCOUNTER — Other Ambulatory Visit
Admission: RE | Admit: 2024-02-28 | Discharge: 2024-02-28 | Disposition: A | Discharge status: Home / Self Care | Attending: Urology | Admitting: Urology

## 2024-02-28 VITALS — BP 195/97 | HR 62

## 2024-02-28 DIAGNOSIS — R3989 Other symptoms and signs involving the genitourinary system: Secondary | ICD-10-CM | POA: Diagnosis not present

## 2024-02-28 DIAGNOSIS — N390 Urinary tract infection, site not specified: Secondary | ICD-10-CM | POA: Insufficient documentation

## 2024-02-28 DIAGNOSIS — N319 Neuromuscular dysfunction of bladder, unspecified: Secondary | ICD-10-CM

## 2024-02-28 DIAGNOSIS — E1069 Type 1 diabetes mellitus with other specified complication: Secondary | ICD-10-CM | POA: Insufficient documentation

## 2024-02-28 DIAGNOSIS — N200 Calculus of kidney: Secondary | ICD-10-CM

## 2024-02-28 LAB — URINALYSIS, COMPLETE (UACMP) WITH MICROSCOPIC
Bilirubin Urine: NEGATIVE
Glucose, UA: NEGATIVE mg/dL
Ketones, ur: NEGATIVE mg/dL
Nitrite: NEGATIVE
Protein, ur: NEGATIVE mg/dL
Specific Gravity, Urine: 1.01 (ref 1.005–1.030)
Squamous Epithelial / HPF: NONE SEEN /HPF (ref 0–5)
pH: 6 (ref 5.0–8.0)

## 2024-02-28 MED ORDER — CEFTRIAXONE SODIUM 500 MG IJ SOLR
1000.0000 mg | Freq: Once | INTRAMUSCULAR | Status: AC
  Administered 2024-02-28: 1000 mg via INTRAMUSCULAR

## 2024-02-28 MED ORDER — CEFUROXIME AXETIL 500 MG PO TABS: 500.0000 mg | ORAL_TABLET | Freq: Two times a day (BID) | ORAL | 0 refills | Status: DC
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
## 2024-02-29 ENCOUNTER — Encounter: Attending: Physician Assistant | Admitting: Physician Assistant

## 2024-02-29 DIAGNOSIS — Z87440 Personal history of urinary (tract) infections: Secondary | ICD-10-CM | POA: Diagnosis not present

## 2024-02-29 DIAGNOSIS — R339 Retention of urine, unspecified: Secondary | ICD-10-CM | POA: Diagnosis not present

## 2024-02-29 DIAGNOSIS — I5042 Chronic combined systolic (congestive) and diastolic (congestive) heart failure: Secondary | ICD-10-CM | POA: Diagnosis not present

## 2024-02-29 DIAGNOSIS — L89153 Pressure ulcer of sacral region, stage 3: Secondary | ICD-10-CM | POA: Insufficient documentation

## 2024-02-29 DIAGNOSIS — E109 Type 1 diabetes mellitus without complications: Secondary | ICD-10-CM | POA: Diagnosis not present

## 2024-02-29 DIAGNOSIS — G8250 Quadriplegia, unspecified: Secondary | ICD-10-CM | POA: Diagnosis not present

## 2024-02-29 DIAGNOSIS — E10622 Type 1 diabetes mellitus with other skin ulcer: Secondary | ICD-10-CM | POA: Insufficient documentation

## 2024-02-29 DIAGNOSIS — G8220 Paraplegia, unspecified: Secondary | ICD-10-CM | POA: Diagnosis not present

## 2024-02-29 LAB — MICROALBUMIN / CREATININE URINE RATIO
Creatinine, Urine: 21 mg/dL
Microalb Creat Ratio: 81 mg/g{creat} — ABNORMAL HIGH (ref 0–29)
Microalb, Ur: 17.1 ug/mL — ABNORMAL HIGH

## 2024-02-29 LAB — URINE CULTURE

## 2024-03-01 DIAGNOSIS — E109 Type 1 diabetes mellitus without complications: Secondary | ICD-10-CM | POA: Diagnosis not present

## 2024-03-01 DIAGNOSIS — R339 Retention of urine, unspecified: Secondary | ICD-10-CM | POA: Diagnosis not present

## 2024-03-01 DIAGNOSIS — G8250 Quadriplegia, unspecified: Secondary | ICD-10-CM | POA: Diagnosis not present

## 2024-03-03 ENCOUNTER — Other Ambulatory Visit: Payer: Self-pay

## 2024-03-03 ENCOUNTER — Other Ambulatory Visit
Admission: RE | Admit: 2024-03-03 | Discharge: 2024-03-03 | Disposition: A | Discharge status: Home / Self Care | Source: Home / Self Care | Attending: Urology | Admitting: Urology

## 2024-03-03 ENCOUNTER — Emergency Department

## 2024-03-03 ENCOUNTER — Emergency Department
Admission: EM | Admit: 2024-03-03 | Discharge: 2024-03-03 | Disposition: A | Discharge status: Home / Self Care | Attending: Emergency Medicine | Admitting: Emergency Medicine

## 2024-03-03 ENCOUNTER — Other Ambulatory Visit: Payer: Self-pay | Admitting: Cardiovascular Disease

## 2024-03-03 DIAGNOSIS — R531 Weakness: Secondary | ICD-10-CM | POA: Diagnosis present

## 2024-03-03 DIAGNOSIS — I160 Hypertensive urgency: Secondary | ICD-10-CM | POA: Diagnosis not present

## 2024-03-03 DIAGNOSIS — N390 Urinary tract infection, site not specified: Secondary | ICD-10-CM

## 2024-03-03 DIAGNOSIS — E109 Type 1 diabetes mellitus without complications: Secondary | ICD-10-CM | POA: Diagnosis not present

## 2024-03-03 DIAGNOSIS — Z79899 Other long term (current) drug therapy: Secondary | ICD-10-CM | POA: Diagnosis not present

## 2024-03-03 DIAGNOSIS — R0602 Shortness of breath: Secondary | ICD-10-CM | POA: Diagnosis not present

## 2024-03-03 DIAGNOSIS — I5032 Chronic diastolic (congestive) heart failure: Secondary | ICD-10-CM

## 2024-03-03 LAB — CBC
HCT: 33.6 % — ABNORMAL LOW (ref 39.0–52.0)
Hemoglobin: 11.6 g/dL — ABNORMAL LOW (ref 13.0–17.0)
MCH: 32.4 pg (ref 26.0–34.0)
MCHC: 34.5 g/dL (ref 30.0–36.0)
MCV: 93.9 fL (ref 80.0–100.0)
Platelets: 189 10*3/uL (ref 150–400)
RBC: 3.58 MIL/uL — ABNORMAL LOW (ref 4.22–5.81)
RDW: 13.1 % (ref 11.5–15.5)
WBC: 6.5 10*3/uL (ref 4.0–10.5)
nRBC: 0 % (ref 0.0–0.2)

## 2024-03-03 LAB — URINALYSIS, COMPLETE (UACMP) WITH MICROSCOPIC
Bilirubin Urine: NEGATIVE
Glucose, UA: NEGATIVE mg/dL
Ketones, ur: NEGATIVE mg/dL
Nitrite: NEGATIVE
Protein, ur: NEGATIVE mg/dL
Specific Gravity, Urine: 1.015 (ref 1.005–1.030)
pH: 6 (ref 5.0–8.0)

## 2024-03-03 LAB — LIPASE, BLOOD: Lipase: 37 U/L (ref 11–51)

## 2024-03-03 LAB — COMPREHENSIVE METABOLIC PANEL WITH GFR
ALT: 22 U/L (ref 0–44)
AST: 20 U/L (ref 15–41)
Albumin: 3.4 g/dL — ABNORMAL LOW (ref 3.5–5.0)
Alkaline Phosphatase: 96 U/L (ref 38–126)
Anion gap: 11 (ref 5–15)
BUN: 17 mg/dL (ref 6–20)
CO2: 28 mmol/L (ref 22–32)
Calcium: 9 mg/dL (ref 8.9–10.3)
Chloride: 95 mmol/L — ABNORMAL LOW (ref 98–111)
Creatinine, Ser: 0.91 mg/dL (ref 0.61–1.24)
GFR, Estimated: >60 mL/min (ref >60)
Glucose, Bld: 147 mg/dL — ABNORMAL HIGH (ref 70–99)
Potassium: 4.5 mmol/L (ref 3.5–5.1)
Sodium: 134 mmol/L — ABNORMAL LOW (ref 135–145)
Total Bilirubin: 0.7 mg/dL (ref 0.0–1.2)
Total Protein: 7 g/dL (ref 6.5–8.1)

## 2024-03-03 LAB — TROPONIN I (HIGH SENSITIVITY): Troponin I (High Sensitivity): 10 ng/L (ref <18)

## 2024-03-03 NOTE — ED Notes (Addendum)
 Pt sts that he does not take his BP meds as directed, which is every day, and takes them as he sees fit. Pt sts that he has midodrine as well as hydrochlorothiazide however takes the midodrine more often then the hydrochlorothiazide. Pt has no interest getting on the ER stretcher. Pt also advises "that I am  tired of repeating myself to the staff and you all should just read the chart." Pt also denies cardiac monitor.

## 2024-03-03 NOTE — ED Triage Notes (Signed)
 Being treated for UTI since Monday, on antibiotics.  Presents with SOB and HTN..  States had diarrhea yesterday, feeling fatigue.  BP 96/60 yesterday morning.  Took meds to raise BP, had some salt and Gatorade also. Took hydralazine for HTN.  At 0430 and again at 1100 today.  AAOx3.  Skin warm and dry. NAD

## 2024-03-03 NOTE — Discharge Instructions (Signed)
 You were seen in the emergency department today for your variable blood pressure.  Your testing fortunately did not show emergency findings.  Follow with your primary care doctor for further evaluation.  Return to the ER for new or worsening symptoms.

## 2024-03-03 NOTE — ED Provider Notes (Signed)
 [facility] Provider Note    Event Date/Time   First MD Initiated Contact with Patient 03/03/24 1530     (approximate)   History   No chief complaint on file.   HPI  [name] is a 53 year old male with history of type 1 diabetes, neurogenic orthostatic hypotension presenting to the emergency department for evaluation of weakness.  Yesterday morning, patient noted his blood pressure was slightly low at 96/60.  He has as needed midodrine and took a dose of this.  Did not initially raise, so he took a second dose and then noted that he had significantly elevated blood pressures with systolics in the 230s.  Today, he took hydralazine.  His blood pressure was still high leading him to present to the ER.  Did report feeling short of breath earlier which she reports is not atypical for him.  Denies chest pain.  Denies headache, numbness, tingling, new weakness.  Additionally reports that he is being treated for a UTI, following with urology for the same.  Just gave a urine culture sample yesterday, would prefer to not be retested today.     Physical Exam   Triage Vital Signs: ED Triage Vitals  Encounter Vitals Group     BP 03/03/24 1433 (!) 170/101     Systolic BP Percentile --      Diastolic BP Percentile --      Pulse Rate 03/03/24 1433 63     Resp 03/03/24 1433 20     Temp 03/03/24 1433 98.2 F (36.8 C)     Temp Source 03/03/24 1433 Oral     SpO2 03/03/24 1433 98 %     Weight 03/03/24 1433 165 lb (74.8 kg)     Height 03/03/24 1433 5\' 10"  (1.778 m)     Head Circumference --      Peak Flow --      Pain Score 03/03/24 1440 0     Pain Loc --      Pain Education --      Exclude from Growth Chart --     Most recent vital signs: Vitals:   03/03/24 1433  BP: (!) 170/101  Pulse: 63  Resp: 20  Temp: 98.2 F (36.8 C)  SpO2: 98%     General: Awake, interactive  CV:  Regular rate, good peripheral perfusion.  Resp:  Unlabored respirations,  lungs clear to auscultation Abd:  Nondistended, soft, nontender Neuro:  Symmetric facial movement, fluid speech   ED Results / Procedures / Treatments   Labs (all labs ordered are listed, but only abnormal results are displayed) Labs Reviewed  COMPREHENSIVE METABOLIC PANEL WITH GFR - Abnormal; Notable for the following components:      Result Value   Sodium 134 (*)    Chloride 95 (*)    Glucose, Bld 147 (*)    Albumin 3.4 (*)    All other components within normal limits  CBC - Abnormal; Notable for the following components:   RBC 3.58 (*)    Hemoglobin 11.6 (*)    HCT 33.6 (*)    All other components within normal limits  LIPASE, BLOOD  TROPONIN I (HIGH SENSITIVITY)     EKG EKG independently reviewed interpreted by myself (ER attending) demonstrates:  EKG demonstrates bradycardia at a rate of 58, PR 1 6, QRS 70, QTc 424, no acute ST changes  RADIOLOGY Imaging independently reviewed and interpreted by myself demonstrates:  CXR without focal consolidation  PROCEDURES:  Critical  Care performed: No  Procedures   MEDICATIONS ORDERED IN ED: Medications - No data to display   IMPRESSION / MDM / ASSESSMENT AND PLAN / ED COURSE  I reviewed the triage vital signs and the nursing notes.  Differential diagnosis includes, but is not limited to, sequela from orthostatic hypotension with overtreatment with medication, lower suspicion hypertensive emergency, lower suspicion significant infectious process, anemia, electrolyte abnormality  Patient's presentation is most consistent with acute presentation with potential threat to life or bodily function.  53 year old male presenting with elevated blood pressure at home after taking midodrine in the setting of low blood pressure.  Reported recent shortness of breath, otherwise without evidence of symptom suggestive hypertensive emergency.  Labs with stable anemia.  Negative troponin.  EKG and x-Adeliz Tonkinson reassuring.  At the time of my  initial evaluation, blood pressure improved to systolics in the 130s.  Suspect patient has underlying autonomic instability, likely exacerbated by his multiple doses of both midodrine and blood pressure medication.  I encouraged patient to take medications only as directed and follow-up with the primary care doctor.  He is eager to be discharged home on reevaluation.  Do think this is reasonable.  Strict return precautions provided.  Patient discharged stable condition.      FINAL CLINICAL IMPRESSION(S) / ED DIAGNOSES   Final diagnoses:  Hypertensive urgency     Rx / DC Orders   ED Discharge Orders     None        Note:  This document was prepared using Dragon voice recognition software and may include unintentional dictation errors.   Trinna Post, MD 03/03/24 [phone]

## 2024-03-06 ENCOUNTER — Other Ambulatory Visit: Payer: Self-pay | Admitting: Urology

## 2024-03-06 ENCOUNTER — Telehealth: Payer: Self-pay

## 2024-03-06 DIAGNOSIS — N200 Calculus of kidney: Secondary | ICD-10-CM

## 2024-03-06 NOTE — Transitions of Care (Post Inpatient/ED Visit) (Unsigned)
   03/06/2024  Name: [name] MRN: [id number] DOB: 31-Mar-1971  Today's TOC FU Call Status: Today's TOC FU Call Status:: Unsuccessful Call (1st Attempt) Unsuccessful Call (1st Attempt) Date: 03/06/24  Attempted to reach the patient regarding the most recent Inpatient/ED visit.  Follow Up Plan: Additional outreach attempts will be made to reach the patient to complete the Transitions of Care (Post Inpatient/ED visit) call.   Signature Karena Addison, LPN [facility] Nurse Health Advisor Direct Dial [phone]

## 2024-03-07 ENCOUNTER — Other Ambulatory Visit: Payer: Self-pay | Admitting: Urology

## 2024-03-07 DIAGNOSIS — R339 Retention of urine, unspecified: Secondary | ICD-10-CM | POA: Diagnosis not present

## 2024-03-07 DIAGNOSIS — E109 Type 1 diabetes mellitus without complications: Secondary | ICD-10-CM | POA: Diagnosis not present

## 2024-03-07 DIAGNOSIS — G8250 Quadriplegia, unspecified: Secondary | ICD-10-CM | POA: Diagnosis not present

## 2024-03-07 HISTORY — PX: WRIST SURGERY: SHX841

## 2024-03-07 MED ORDER — CIPROFLOXACIN HCL 500 MG PO TABS: 500.0000 mg | ORAL_TABLET | Freq: Two times a day (BID) | ORAL | 0 refills | Status: AC

## 2024-03-07 NOTE — Transitions of Care (Post Inpatient/ED Visit) (Signed)
 03/07/2024  Name: [name] MRN: [id number] DOB: [birth year]  Today's TOC FU Call Status: Today's TOC FU Call Status:: Successful TOC FU Call Completed Unsuccessful Call (1st Attempt) Date: 03/06/24 [facility] FU Call Complete Date: 03/07/24 Patient's Name and Date of Birth confirmed.  Transition Care Management Follow-up Telephone Call Date of Discharge: 04/03/24 Discharge Facility: [facility]) Type of Discharge: Emergency Department Reason for ED Visit: Other: (hypertension) How have you been since you were released from the hospital?: Better Any questions or concerns?: No  Items Reviewed: Did you receive and understand the discharge instructions provided?: Yes Medications obtained,verified, and reconciled?: Yes (Medications Reviewed) Any new allergies since your discharge?: No Dietary orders reviewed?: Yes Do you have support at home?: No  Medications Reviewed Today: Medications Reviewed Today     Reviewed by Karena Addison, LPN (Licensed Practical Nurse) on 03/07/24 at 1551  Med List Status: <None>   Medication Order Taking? Sig Documenting Provider Last Dose Status Informant  Ascorbic Acid (VITAMIN C) 1000 MG tablet [id number] No Take 1,000 mg by mouth 2 (two) times daily.  [provider] Taking Active Self  baclofen (LIORESAL) 20 MG tablet [id number] No TAKE 2 TABLETS THREE TIMES A Donne Anon, MD Taking Active   blood glucose meter kit and supplies KIT [id number] No Dispense Dexcom G6 Sensor meter to check blood sugars up to 4 times daily. DX code E 10.9 Dale [place], MD Taking Active Self  cefUROXime (CEFTIN) 500 MG tablet [id number]  Take 1 tablet (500 mg total) by mouth 2 (two) times daily with a meal. McGowan, Shannon A, PA-C  Active   Continuous Blood Gluc Sensor (DEXCOM G6 SENSOR) MISC [id number] No 1 each by Does not apply route as directed. Every 10 days Dale Scott, MD Taking Active   hydrALAZINE (APRESOLINE) 25 MG  tablet [id number] No Take 1 tablet (25 mg total) by mouth 3 (three) times daily as needed (high blood pressure). Antonieta Iba, MD Taking Active   hydrocortisone (ANUSOL-HC) 25 MG suppository [id number] No Place 1 suppository (25 mg total) rectally 2 (two) times daily as needed for hemorrhoids or anal itching. Dale Lamar, MD Taking Active   Insulin Disposable Pump (OMNIPOD 5 G6 POD, GEN 5,) MISC [id number] No continuous as needed. [provider] Taking Active   insulin lispro (HUMALOG) 100 UNIT/ML injection [id number] No Inject into the skin continuous. [provider] Taking Active   mesalamine (CANASA) 1000 MG suppository [id number] No Place 1 suppository (1,000 mg total) rectally 2 (two) times daily. Midge Minium, MD Taking Active   methenamine (HIPREX) 1 g tablet [id number] No Take 1 tablet (1 g total) by mouth 2 (two) times daily. [name] [place], MD Taking Active   midodrine (PROAMATINE) 5 MG tablet [id number] No Take 1 tablet (5 mg total) by mouth 3 (three) times daily with meals as needed (low pressure). Antonieta Iba, MD Taking Active   nystatin (MYCOSTATIN/NYSTOP) powder [id number] No Apply 1 Application topically daily. [provider] Taking Active   omeprazole (PRILOSEC) 20 MG capsule [id number] No Take 1 capsule (20 mg total) by mouth 2 (two) times daily before a meal. Dale [place], MD Taking Active   rosuvastatin (CRESTOR) 5 MG tablet [id number] No TAKE ONE TABLET 3 DAYS PER WEEK AS DIRECTED - Monday, Wednesday, Friday Dale Patterson, MD Taking Active             Home Care and Equipment/Supplies: Were Home Health Services Ordered?: NA Any new equipment or medical  supplies ordered?: NA  Functional Questionnaire: Do you need assistance with bathing/showering or dressing?: No Do you need assistance with meal preparation?: No Do you need assistance with eating?: No Do you have difficulty maintaining continence: No Do you need assistance with  getting out of bed/getting out of a chair/moving?: No Do you have difficulty managing or taking your medications?: No  Follow up appointments reviewed: PCP Follow-up appointment confirmed?: No (declined) MD Provider Line Number:[phone] Given: No Specialist Hospital Follow-up appointment confirmed?: NA Do you need transportation to your follow-up appointment?: No Do you understand care options if your condition(s) worsen?: Yes-patient verbalized understanding    SIGNATURE Karena Addison, LPN [facility] Nurse Health Advisor Direct Dial [phone]

## 2024-03-14 DIAGNOSIS — E109 Type 1 diabetes mellitus without complications: Secondary | ICD-10-CM | POA: Diagnosis not present

## 2024-03-21 DIAGNOSIS — Z9641 Presence of insulin pump (external) (internal): Secondary | ICD-10-CM | POA: Diagnosis not present

## 2024-03-21 DIAGNOSIS — E109 Type 1 diabetes mellitus without complications: Secondary | ICD-10-CM | POA: Diagnosis not present

## 2024-03-22 ENCOUNTER — Telehealth: Payer: Self-pay

## 2024-03-22 NOTE — Telephone Encounter (Signed)
 Tried to call pt to get scheduled due to being on the A1c compliance list and it showed Dr. Geralyn Knee wanted him to follow up in 3 months , mychart has been sent asking p to call and get scheduled.

## 2024-03-23 ENCOUNTER — Ambulatory Visit
Admission: RE | Admit: 2024-03-23 | Discharge: 2024-03-23 | Disposition: A | Discharge status: Home / Self Care | Source: Ambulatory Visit | Attending: Urology | Admitting: Urology

## 2024-03-23 DIAGNOSIS — R932 Abnormal findings on diagnostic imaging of liver and biliary tract: Secondary | ICD-10-CM | POA: Diagnosis not present

## 2024-03-23 DIAGNOSIS — R109 Unspecified abdominal pain: Secondary | ICD-10-CM | POA: Diagnosis not present

## 2024-03-23 DIAGNOSIS — N200 Calculus of kidney: Secondary | ICD-10-CM | POA: Diagnosis not present

## 2024-03-23 DIAGNOSIS — K769 Liver disease, unspecified: Secondary | ICD-10-CM | POA: Diagnosis not present

## 2024-03-27 ENCOUNTER — Ambulatory Visit: Admitting: Urology

## 2024-03-27 DIAGNOSIS — M654 Radial styloid tenosynovitis [de Quervain]: Secondary | ICD-10-CM | POA: Diagnosis not present

## 2024-03-28 ENCOUNTER — Ambulatory Visit: Admitting: Physician Assistant

## 2024-03-29 ENCOUNTER — Ambulatory Visit: Admitting: Urology

## 2024-03-30 DIAGNOSIS — E109 Type 1 diabetes mellitus without complications: Secondary | ICD-10-CM | POA: Diagnosis not present

## 2024-04-04 ENCOUNTER — Encounter: Attending: Physician Assistant | Admitting: Physician Assistant

## 2024-04-04 DIAGNOSIS — X088XXA Exposure to other specified smoke, fire and flames, initial encounter: Secondary | ICD-10-CM | POA: Insufficient documentation

## 2024-04-04 DIAGNOSIS — I5042 Chronic combined systolic (congestive) and diastolic (congestive) heart failure: Secondary | ICD-10-CM | POA: Insufficient documentation

## 2024-04-04 DIAGNOSIS — G8220 Paraplegia, unspecified: Secondary | ICD-10-CM | POA: Insufficient documentation

## 2024-04-04 DIAGNOSIS — L89153 Pressure ulcer of sacral region, stage 3: Secondary | ICD-10-CM | POA: Insufficient documentation

## 2024-04-04 DIAGNOSIS — E109 Type 1 diabetes mellitus without complications: Secondary | ICD-10-CM | POA: Insufficient documentation

## 2024-04-04 DIAGNOSIS — T23261A Burn of second degree of back of right hand, initial encounter: Secondary | ICD-10-CM | POA: Insufficient documentation
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
## 2024-04-10 ENCOUNTER — Encounter: Payer: Self-pay | Admitting: Cardiovascular Disease

## 2024-04-10 ENCOUNTER — Ambulatory Visit: Attending: Cardiovascular Disease | Admitting: Cardiovascular Disease

## 2024-04-10 VITALS — BP 100/60 | HR 54 | Ht 72.0 in | Wt 175.0 lb

## 2024-04-10 DIAGNOSIS — G904 Autonomic dysreflexia: Secondary | ICD-10-CM

## 2024-04-10 DIAGNOSIS — E0810 Diabetes mellitus due to underlying condition with ketoacidosis without coma: Secondary | ICD-10-CM

## 2024-04-10 DIAGNOSIS — I63443 Cerebral infarction due to embolism of bilateral cerebellar arteries: Secondary | ICD-10-CM

## 2024-04-10 DIAGNOSIS — R0601 Orthopnea: Secondary | ICD-10-CM

## 2024-04-10 DIAGNOSIS — Z959 Presence of cardiac and vascular implant and graft, unspecified: Secondary | ICD-10-CM | POA: Diagnosis not present

## 2024-04-10 DIAGNOSIS — I639 Cerebral infarction, unspecified: Secondary | ICD-10-CM | POA: Diagnosis not present

## 2024-04-10 DIAGNOSIS — E1069 Type 1 diabetes mellitus with other specified complication: Secondary | ICD-10-CM

## 2024-04-10 DIAGNOSIS — I5032 Chronic diastolic (congestive) heart failure: Secondary | ICD-10-CM | POA: Diagnosis not present

## 2024-04-10 DIAGNOSIS — R600 Localized edema: Secondary | ICD-10-CM | POA: Diagnosis not present

## 2024-04-10 DIAGNOSIS — G903 Multi-system degeneration of the autonomic nervous system: Secondary | ICD-10-CM | POA: Diagnosis not present

## 2024-04-10 DIAGNOSIS — F1029 Alcohol dependence with unspecified alcohol-induced disorder: Secondary | ICD-10-CM

## 2024-04-10 MED ORDER — ISOSORBIDE DINITRATE 30 MG PO TABS: 30.0000 mg | ORAL_TABLET | Freq: Three times a day (TID) | ORAL | 1 refills | Status: DC | PRN

## 2024-04-10 NOTE — Patient Instructions (Addendum)
 Medication Instructions:  Isosorbide dinitrate 20 mg three times a day as needed for pressure >160  If you need a refill on your cardiac medications before your next appointment, please call your pharmacy.   Lab work: No new labs needed  Testing/Procedures: No new testing needed  Follow-Up: At [facility], you and your health needs are our priority.  As part of our continuing mission to provide you with exceptional heart care, we have created designated Provider Care Teams.  These Care Teams include your primary Cardiologist (physician) and Advanced Practice Providers (APPs -  Physician Assistants and Nurse Practitioners) who all work together to provide you with the care you need, when you need it.  You will need a follow up appointment in 12 months  Providers on your designated Care Team:   Laneta Pintos, NP Varney Gentleman, PA-C Cadence Gennaro Khat, [place]  COVID-19 Vaccine Information can be found at: [email or web address] For questions related to vaccine distribution or appointments, please email [email or web address] or call [phone].

## 2024-04-11 ENCOUNTER — Encounter: Payer: Self-pay | Admitting: Urology

## 2024-04-11 ENCOUNTER — Ambulatory Visit: Admitting: Urology

## 2024-04-11 VITALS — BP 212/105 | HR 56 | Ht 72.0 in | Wt 175.0 lb

## 2024-04-11 DIAGNOSIS — N319 Neuromuscular dysfunction of bladder, unspecified: Secondary | ICD-10-CM | POA: Diagnosis not present

## 2024-04-11 DIAGNOSIS — N390 Urinary tract infection, site not specified: Secondary | ICD-10-CM | POA: Diagnosis not present

## 2024-04-11 DIAGNOSIS — N200 Calculus of kidney: Secondary | ICD-10-CM

## 2024-04-11 LAB — MICROSCOPIC EXAMINATION

## 2024-04-11 LAB — URINALYSIS, COMPLETE
Bilirubin, UA: NEGATIVE
Glucose, UA: NEGATIVE
Ketones, UA: NEGATIVE
Leukocytes,UA: NEGATIVE
Nitrite, UA: NEGATIVE
Protein,UA: NEGATIVE
RBC, UA: NEGATIVE
Specific Gravity, UA: <1.005 — ABNORMAL LOW (ref 1.005–1.030)
Urobilinogen, Ur: 0.2 mg/dL (ref 0.2–1.0)
pH, UA: 6 (ref 5.0–7.5)
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
## 2024-04-18 ENCOUNTER — Ambulatory Visit: Admitting: Internal Medicine

## 2024-04-19 DIAGNOSIS — M654 Radial styloid tenosynovitis [de Quervain]: Secondary | ICD-10-CM | POA: Diagnosis not present

## 2024-04-26 ENCOUNTER — Ambulatory Visit
Admission: EM | Admit: 2024-04-26 | Discharge: 2024-04-26 | Disposition: A | Discharge status: Home / Self Care | Attending: Emergency Medicine | Admitting: Emergency Medicine

## 2024-04-26 DIAGNOSIS — R109 Unspecified abdominal pain: Secondary | ICD-10-CM | POA: Insufficient documentation

## 2024-04-26 DIAGNOSIS — N390 Urinary tract infection, site not specified: Secondary | ICD-10-CM | POA: Diagnosis not present

## 2024-04-26 DIAGNOSIS — R300 Dysuria: Secondary | ICD-10-CM | POA: Diagnosis not present

## 2024-04-26 DIAGNOSIS — R030 Elevated blood-pressure reading, without diagnosis of hypertension: Secondary | ICD-10-CM | POA: Diagnosis not present

## 2024-04-26 LAB — URINALYSIS, W/ REFLEX TO CULTURE (INFECTION SUSPECTED)
Bilirubin Urine: NEGATIVE
Glucose, UA: NEGATIVE mg/dL
Hgb urine dipstick: NEGATIVE
Ketones, ur: NEGATIVE mg/dL
Nitrite: NEGATIVE
Protein, ur: NEGATIVE mg/dL
Specific Gravity, Urine: 1.01 (ref 1.005–1.030)
pH: 6 (ref 5.0–8.0)

## 2024-04-26 MED ORDER — CIPROFLOXACIN HCL 500 MG PO TABS: 500.0000 mg | ORAL_TABLET | Freq: Two times a day (BID) | ORAL | 0 refills | Status: AC
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
## 2024-04-26 NOTE — ED Triage Notes (Signed)
 Pt c/o R flank pain x3 days & pain w/urination x1 day. Hx of kidney stones. Denies any hematuria.

## 2024-04-26 NOTE — Discharge Instructions (Addendum)
 Take antibiotic as directed Push fluids Avoid cafeeine Urine culture pending Follow up with your urologist-call tomorrow for follow-up appointment If you develop fever, worsening pain, nausea vomiting, inability to keep medications down will need to go to the emergency room for further evaluation.

## 2024-04-30 LAB — URINE CULTURE: Culture: >=100000 — AB

## 2024-05-01 ENCOUNTER — Emergency Department
Admission: EM | Admit: 2024-05-01 | Discharge: 2024-05-01 | Disposition: A | Discharge status: Home / Self Care | Attending: Emergency Medicine | Admitting: Emergency Medicine

## 2024-05-01 ENCOUNTER — Other Ambulatory Visit: Payer: Self-pay

## 2024-05-01 ENCOUNTER — Ambulatory Visit (HOSPITAL_COMMUNITY): Payer: Self-pay

## 2024-05-01 DIAGNOSIS — R300 Dysuria: Secondary | ICD-10-CM | POA: Diagnosis present

## 2024-05-01 DIAGNOSIS — E109 Type 1 diabetes mellitus without complications: Secondary | ICD-10-CM | POA: Insufficient documentation

## 2024-05-01 DIAGNOSIS — N3000 Acute cystitis without hematuria: Secondary | ICD-10-CM | POA: Diagnosis not present

## 2024-05-01 DIAGNOSIS — Z1630 Resistance to unspecified antimicrobial drugs: Secondary | ICD-10-CM | POA: Insufficient documentation

## 2024-05-01 LAB — URINALYSIS, W/ REFLEX TO CULTURE (INFECTION SUSPECTED)
Bilirubin Urine: NEGATIVE
Glucose, UA: NEGATIVE mg/dL
Hgb urine dipstick: NEGATIVE
Ketones, ur: NEGATIVE mg/dL
Nitrite: NEGATIVE
Protein, ur: NEGATIVE mg/dL
Specific Gravity, Urine: 1.005 (ref 1.005–1.030)
pH: 6 (ref 5.0–8.0)

## 2024-05-01 MED ORDER — DIPHENHYDRAMINE HCL 25 MG PO CAPS
50.0000 mg | ORAL_CAPSULE | Freq: Once | ORAL | Status: AC
  Administered 2024-05-01: 50 mg via ORAL
  Filled 2024-05-01: qty 2

## 2024-05-01 MED ORDER — SULFAMETHOXAZOLE-TRIMETHOPRIM 800-160 MG PO TABS: 1.0000 | ORAL_TABLET | Freq: Two times a day (BID) | ORAL | 0 refills | Status: AC

## 2024-05-01 MED ORDER — SULFAMETHOXAZOLE-TRIMETHOPRIM 800-160 MG PO TABS
1.0000 | ORAL_TABLET | Freq: Once | ORAL | Status: AC
  Administered 2024-05-01: 1 via ORAL
  Filled 2024-05-01: qty 1

## 2024-05-01 NOTE — Discharge Instructions (Addendum)
 As we discussed, Bactrim antibiotics twice daily for 1 week for your UTI.  Bactrim is a sulfa antibiotic, and you have a documented allergy, so try taking Benadryl  25-50 mg (1-2 tablets) with each dose to help mitigate any potential allergic reaction.  If you do have a severe allergic reaction, worsening symptoms despite these medications, the please return to the ED.  In the situation you would likely need admission to the hospital for IV antibiotics.  Follow-up with your urologist, I have attached their information.

## 2024-05-01 NOTE — Telephone Encounter (Signed)
 Pt returned call. Advised of results and need to go to ER. Pt does advise he is feeling some better, but understands the need to seek further treatment. Questions answered.

## 2024-05-01 NOTE — ED Triage Notes (Addendum)
 Pt sent to ED by urgent care that saw him 5 days ago for a UTI and prescribed cipro . Per urine culture done at urgent care, pt will need IV antibiotics. Pt AOX4, NAD noted- pt c/o dysuria.

## 2024-05-01 NOTE — ED Provider Notes (Signed)
 [facility] Provider Note    Event Date/Time   First MD Initiated Contact with Patient 05/01/24 1459     (approximate)   History   Dysuria   HPI  [name] is a 52 y.o. male who presents to the ED for evaluation of Dysuria   I review urine culture and urgent care visit from 5 days ago. Resulting with 3 organisms: E coli, Serratia, Flavobacterium. He was discharged on Cipro , and culture shows resistance to this ABX from all bacteria. All species though are susceptible to Bactrim, but he has documented allergy to Bactrim.   Patient presents to the ED at the direction of the phone call he got from urgent care today.  Persistent dysuria without fevers, malaise or flank pain.  Reports he feels fine overall despite the persistent dysuria.  When we discussed the drug resistance from the urine culture, he asks if he can just go home with Benadryl  and Bactrim.  Reports 1 episode of mild tongue swelling where he could feel his tongue between his teeth about 30 years ago when taking a sulfa antibiotic.  We discussed possibly being admitted for IV antibiotics but he declines, with capacity, preferring to try Benadryl  and Bactrim at home followed acknowledging risks of severe allergic reactions  Physical Exam   Triage Vital Signs: ED Triage Vitals  Encounter Vitals Group     BP 05/01/24 1431 (!) 178/105     Systolic BP Percentile --      Diastolic BP Percentile --      Pulse Rate 05/01/24 1431 61     Resp 05/01/24 1431 17     Temp 05/01/24 1431 97.9 F (36.6 C)     Temp Source 05/01/24 1431 Oral     SpO2 05/01/24 1431 97 %     Weight 05/01/24 1432 175 lb (79.4 kg)     Height 05/01/24 1432 6' (1.829 m)     Head Circumference --      Peak Flow --      Pain Score 05/01/24 1432 5     Pain Loc --      Pain Education --      Exclude from Growth Chart --     Most recent vital signs: Vitals:   05/01/24 1431  BP: (!) 178/105  Pulse: 61  Resp: 17   Temp: 97.9 F (36.6 C)  SpO2: 97%    General: Awake, no distress.  Sitting upright in his wheelchair, well-appearing and conversational CV:  Good peripheral perfusion.  Resp:  Normal effort.  Abd:  No distention.  Soft MSK:  No deformity noted.  Neuro:  No focal deficits appreciated. Other:     ED Results / Procedures / Treatments   Labs (all labs ordered are listed, but only abnormal results are displayed) Labs Reviewed  URINALYSIS, W/ REFLEX TO CULTURE (INFECTION SUSPECTED) - Abnormal; Notable for the following components:      Result Value   Color, Urine STRAW (*)    APPearance CLEAR (*)    Leukocytes,Ua SMALL (*)    Bacteria, UA MANY (*)    All other components within normal limits  URINE CULTURE    EKG   RADIOLOGY   Official radiology report(s): No results found.  PROCEDURES and INTERVENTIONS:  Procedures  Medications  sulfamethoxazole-trimethoprim (BACTRIM DS) 800-160 MG per tablet 1 tablet (1 tablet Oral Given 05/01/24 1519)  diphenhydrAMINE  (BENADRYL ) capsule 50 mg (50 mg Oral Given 05/01/24 1520)  IMPRESSION / MDM / ASSESSMENT AND PLAN / ED COURSE  I reviewed the triage vital signs and the nursing notes.  Differential diagnosis includes, but is not limited to, sepsis, pyelonephritis, UTI  {Patient presents with symptoms of an acute illness or injury that is potentially life-threatening.  Type I diabetic with neurogenic bladder and frequent UTIs presents with persistent symptoms of cystitis in the setting of resistant bacteria. Recent culture shows Bactrim as only possible antibiotic tested to work for all 3 organisms.  As above, patient requests discharge with Bactrim and taking Benadryl  at home alongside this to mitigate any possible allergic reactions.  He has capacity and acknowledges the risks of severe allergic reaction.  We discussed observation admission and I recommend this but he declines.  Discussed following up with his urologist.       FINAL CLINICAL IMPRESSION(S) / ED DIAGNOSES   Final diagnoses:  Acute cystitis without hematuria  Microbial resistance to antimicrobial drug     Rx / DC Orders   ED Discharge Orders          Ordered    sulfamethoxazole-trimethoprim (BACTRIM DS) 800-160 MG tablet  2 times daily        05/01/24 1518             Note:  This document was prepared using Dragon voice recognition software and may include unintentional dictation errors.   Arline Bennett, MD 05/01/24 [phone]

## 2024-05-02 ENCOUNTER — Ambulatory Visit: Admitting: Physician Assistant

## 2024-05-05 LAB — URINE CULTURE: Culture: >=100000 — AB

## 2024-05-09 ENCOUNTER — Encounter: Attending: Physician Assistant | Admitting: Physician Assistant

## 2024-05-09 DIAGNOSIS — G8220 Paraplegia, unspecified: Secondary | ICD-10-CM | POA: Insufficient documentation

## 2024-05-09 DIAGNOSIS — T23261A Burn of second degree of back of right hand, initial encounter: Secondary | ICD-10-CM | POA: Insufficient documentation

## 2024-05-09 DIAGNOSIS — I5042 Chronic combined systolic (congestive) and diastolic (congestive) heart failure: Secondary | ICD-10-CM | POA: Diagnosis not present

## 2024-05-09 DIAGNOSIS — E10622 Type 1 diabetes mellitus with other skin ulcer: Secondary | ICD-10-CM | POA: Diagnosis not present

## 2024-05-09 DIAGNOSIS — L89153 Pressure ulcer of sacral region, stage 3: Secondary | ICD-10-CM | POA: Insufficient documentation

## 2024-05-09 DIAGNOSIS — X088XXA Exposure to other specified smoke, fire and flames, initial encounter: Secondary | ICD-10-CM | POA: Insufficient documentation

## 2024-05-24 ENCOUNTER — Telehealth: Payer: Self-pay | Admitting: *Deleted

## 2024-05-24 ENCOUNTER — Ambulatory Visit (INDEPENDENT_AMBULATORY_CARE_PROVIDER_SITE_OTHER): Payer: Medicare Other | Admitting: *Deleted

## 2024-05-24 VITALS — Ht 72.0 in | Wt 175.0 lb

## 2024-05-24 DIAGNOSIS — Z0000 Encounter for general adult medical examination without abnormal findings: Secondary | ICD-10-CM | POA: Diagnosis not present

## 2024-05-24 NOTE — Telephone Encounter (Signed)
 FYI
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
## 2024-05-24 NOTE — Patient Instructions (Signed)
 [name] , Thank you for taking time out of your busy schedule to complete your Annual Wellness Visit with me. I enjoyed our conversation and look forward to speaking with you again next year. I, as well as your care team,  appreciate your ongoing commitment to your health goals. Please review the following plan we discussed and let me know if I can assist you in the future. Your Game plan/ To Do List    Referrals: If you haven't heard from the office you've been referred to, please reach out to them at the phone provided.  Remember to call and schedule an eye appointment. Consider updating your vaccines Follow up Visits: Next Medicare AWV with our clinical staff: 05/28/25 @ 3:40   Have you seen your provider in the last 6 months (3 months if uncontrolled diabetes)? Yes Next Office Visit with your provider: 07/28/24  Clinician Recommendations:  Aim for 30 minutes of exercise or brisk walking, 6-8 glasses of water, and 5 servings of fruits and vegetables each day.       This is a list of the screening recommended for you and due dates:  Health Maintenance  Topic Date Due   Hepatitis C Screening  Never done   Pneumococcal Vaccination (1 of 2 - PCV) Never done   Zoster (Shingles) Vaccine (1 of 2) Never done   COVID-19 Vaccine (3 - Moderna risk series) 10/14/2020   Complete foot exam   02/20/2023   Eye exam for diabetics  04/05/2024   Hemoglobin A1C  06/26/2024   Flu Shot  07/07/2024   Yearly kidney health urinalysis for diabetes  02/27/2025   Yearly kidney function blood test for diabetes  03/03/2025   Medicare Annual Wellness Visit  05/24/2025   Colon Cancer Screening  08/27/2032   HIV Screening  Completed   HPV Vaccine  Aged Out   Meningitis B Vaccine  Aged Out   DTaP/Tdap/Td vaccine  Discontinued    Advanced directives: (ACP Link)Information on Advanced Care Planning can be found at [place]  Secretary of [employer] Advance Health Care Directives Advance Health Care Directives.  [email or web address]  Advance Care Planning is important because it:  [x]  Makes sure you receive the medical care that is consistent with your values, goals, and preferences  [x]  It provides guidance to your family and loved ones and reduces their decisional burden about whether or not they are making the right decisions based on your wishes.  Follow the link provided in your after visit summary or read over the paperwork we have mailed to you to help you started getting your Advance Directives in place. If you need assistance in completing these, please reach out to us  so that we can help you!

## 2024-05-24 NOTE — Telephone Encounter (Signed)
 Performed AWV today. FYI Patient stated that his blood pressure this am was 146/98. Requested patient to recheck his blood pressure at the time of the visit which he stated that he was not able to. Patient stated that his blood pressure fluctuates all the time. Patient stated that he has blood pressure medication that Dr. Gollan gave him to take if it gets way out of control. Patient denies any symptoms.  Called patient back later and was advised that he checked his blood pressure about 40 minutes after the phone visit and it was 90/52 and then when on the phone he took it again and it was 132/90. Patient stated that he is not concerned about his blood pressure with these reading. Patient stated that he will take the blood pressure medication if he gets concerned.

## 2024-05-26 ENCOUNTER — Encounter: Payer: Self-pay | Admitting: Internal Medicine

## 2024-05-26 ENCOUNTER — Telehealth: Payer: Self-pay | Admitting: Internal Medicine

## 2024-05-26 NOTE — Telephone Encounter (Signed)
 Left message for patient to call and reschedule follow up appointmetn.  E2C2 please reschedule appt

## 2024-06-06 ENCOUNTER — Encounter: Attending: Physician Assistant | Admitting: Physician Assistant

## 2024-06-06 DIAGNOSIS — T23261A Burn of second degree of back of right hand, initial encounter: Secondary | ICD-10-CM | POA: Diagnosis not present

## 2024-06-06 DIAGNOSIS — E10622 Type 1 diabetes mellitus with other skin ulcer: Secondary | ICD-10-CM | POA: Diagnosis not present

## 2024-06-06 DIAGNOSIS — G8220 Paraplegia, unspecified: Secondary | ICD-10-CM | POA: Diagnosis not present

## 2024-06-06 DIAGNOSIS — L89153 Pressure ulcer of sacral region, stage 3: Secondary | ICD-10-CM | POA: Diagnosis not present

## 2024-06-06 DIAGNOSIS — I5042 Chronic combined systolic (congestive) and diastolic (congestive) heart failure: Secondary | ICD-10-CM | POA: Insufficient documentation

## 2024-06-21 DIAGNOSIS — M654 Radial styloid tenosynovitis [de Quervain]: Secondary | ICD-10-CM | POA: Diagnosis not present

## 2024-06-21 DIAGNOSIS — M1812 Unilateral primary osteoarthritis of first carpometacarpal joint, left hand: Secondary | ICD-10-CM | POA: Diagnosis not present

## 2024-06-28 DIAGNOSIS — E109 Type 1 diabetes mellitus without complications: Secondary | ICD-10-CM | POA: Diagnosis not present

## 2024-06-30 DIAGNOSIS — R339 Retention of urine, unspecified: Secondary | ICD-10-CM | POA: Diagnosis not present

## 2024-06-30 DIAGNOSIS — E109 Type 1 diabetes mellitus without complications: Secondary | ICD-10-CM | POA: Diagnosis not present

## 2024-06-30 DIAGNOSIS — Z87440 Personal history of urinary (tract) infections: Secondary | ICD-10-CM | POA: Diagnosis not present

## 2024-06-30 DIAGNOSIS — G8250 Quadriplegia, unspecified: Secondary | ICD-10-CM | POA: Diagnosis not present

## 2024-07-07 DIAGNOSIS — L89153 Pressure ulcer of sacral region, stage 3: Secondary | ICD-10-CM | POA: Diagnosis not present

## 2024-07-11 ENCOUNTER — Ambulatory Visit: Admitting: Physician Assistant

## 2024-07-24 DIAGNOSIS — M65312 Trigger thumb, left thumb: Secondary | ICD-10-CM | POA: Diagnosis not present

## 2024-07-25 ENCOUNTER — Encounter: Attending: Physician Assistant | Admitting: Physician Assistant

## 2024-07-25 DIAGNOSIS — G8220 Paraplegia, unspecified: Secondary | ICD-10-CM | POA: Insufficient documentation

## 2024-07-25 DIAGNOSIS — E10622 Type 1 diabetes mellitus with other skin ulcer: Secondary | ICD-10-CM | POA: Insufficient documentation

## 2024-07-25 DIAGNOSIS — I5042 Chronic combined systolic (congestive) and diastolic (congestive) heart failure: Secondary | ICD-10-CM | POA: Insufficient documentation

## 2024-07-25 DIAGNOSIS — L89153 Pressure ulcer of sacral region, stage 3: Secondary | ICD-10-CM | POA: Diagnosis not present

## 2024-07-25 DIAGNOSIS — T23261A Burn of second degree of back of right hand, initial encounter: Secondary | ICD-10-CM | POA: Diagnosis not present

## 2024-07-25 DIAGNOSIS — X58XXXA Exposure to other specified factors, initial encounter: Secondary | ICD-10-CM | POA: Insufficient documentation

## 2024-07-26 DIAGNOSIS — E10622 Type 1 diabetes mellitus with other skin ulcer: Secondary | ICD-10-CM | POA: Diagnosis not present

## 2024-07-26 DIAGNOSIS — L89153 Pressure ulcer of sacral region, stage 3: Secondary | ICD-10-CM | POA: Diagnosis not present

## 2024-07-28 ENCOUNTER — Ambulatory Visit: Admitting: Internal Medicine

## 2024-07-31 ENCOUNTER — Telehealth: Payer: Self-pay

## 2024-07-31 NOTE — Telephone Encounter (Signed)
 Pt has UTI symptoms of burning with urination and sweats and not feeling well in general. Because we have no Same day appts today or tomorrow to offer him. I suggested going ahead to urgent care and patient voiced understanding.

## 2024-08-01 ENCOUNTER — Ambulatory Visit: Payer: Self-pay | Admitting: Emergency Medicine

## 2024-08-01 ENCOUNTER — Ambulatory Visit
Admission: EM | Admit: 2024-08-01 | Discharge: 2024-08-01 | Disposition: A | Discharge status: Home / Self Care | Attending: Emergency Medicine | Admitting: Emergency Medicine

## 2024-08-01 DIAGNOSIS — N390 Urinary tract infection, site not specified: Secondary | ICD-10-CM | POA: Diagnosis not present

## 2024-08-01 LAB — URINALYSIS, W/ REFLEX TO CULTURE (INFECTION SUSPECTED)
Bilirubin Urine: NEGATIVE
Glucose, UA: NEGATIVE mg/dL
Hgb urine dipstick: NEGATIVE
Ketones, ur: NEGATIVE mg/dL
Nitrite: NEGATIVE
Protein, ur: NEGATIVE mg/dL
RBC / HPF: NONE SEEN RBC/hpf (ref 0–5)
Specific Gravity, Urine: <1.005 — ABNORMAL LOW (ref 1.005–1.030)
Squamous Epithelial / HPF: NONE SEEN /HPF (ref 0–5)
pH: 6 (ref 5.0–8.0)

## 2024-08-01 MED ORDER — NITROFURANTOIN MONOHYD MACRO 100 MG PO CAPS: 100.0000 mg | ORAL_CAPSULE | Freq: Two times a day (BID) | ORAL | 0 refills | Status: AC

## 2024-08-01 MED ORDER — CEFDINIR 300 MG PO CAPS: 300.0000 mg | ORAL_CAPSULE | Freq: Two times a day (BID) | ORAL | 0 refills | Status: DC
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
## 2024-08-01 NOTE — ED Triage Notes (Signed)
 Sx x 5 days  Patient states that he feels fatigued. Light headed. Dysuria. Patient states that it feels like a UTI.

## 2024-08-01 NOTE — Discharge Instructions (Signed)
 I am putting you on 2 different antibiotics that should take care of of a urinary tract infection based on your most recent urine culture.  I am going to be conservative and extend the broad-spectrum antibiotic out for 10 days.  Keep a close eye on your glucose.  Continue drinking plenty of fluids.  You can try Tylenol  or baclofen  for the abdominal pain/spasms.  Please follow-up with your PCP or your urologist if not getting better in a few days.  Go immediately to the emergency department if your abdominal pain changes, gets worse, happens at other times other than when you are lying down, fevers above 100.4, if your fatigue gets worse, or for any other concerns whatsoever.

## 2024-08-04 ENCOUNTER — Encounter: Payer: Self-pay | Admitting: Pharmacist

## 2024-08-04 NOTE — Progress Notes (Signed)
 Pharmacy Quality Measure Review  This patient is appearing on a report for being at risk of failing the adherence measure for cholesterol (statin) medications this calendar year.   Medication: rosuvastatin  5 mg Last fill date: 03/22/24 for 84 day supply  Insurance report was not up to date. No action needed at this time.  Medication has been refilled as of 07/22/24.  1 additional 84d refill remaining.

## 2024-08-09 ENCOUNTER — Other Ambulatory Visit: Payer: Self-pay | Admitting: Internal Medicine

## 2024-08-10 ENCOUNTER — Ambulatory Visit: Admission: EM | Admit: 2024-08-10 | Discharge: 2024-08-10 | Disposition: A | Discharge status: Home / Self Care

## 2024-08-10 DIAGNOSIS — R1084 Generalized abdominal pain: Secondary | ICD-10-CM | POA: Diagnosis not present

## 2024-08-10 DIAGNOSIS — K5909 Other constipation: Secondary | ICD-10-CM | POA: Insufficient documentation

## 2024-08-10 DIAGNOSIS — Z87440 Personal history of urinary (tract) infections: Secondary | ICD-10-CM | POA: Insufficient documentation

## 2024-08-10 LAB — SUSCEPTIBILITY, AER + ANAEROB

## 2024-08-10 LAB — SUSCEPTIBILITY RESULT

## 2024-08-10 NOTE — ED Notes (Signed)
Called in lobby x1.  
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
## 2024-08-10 NOTE — Discharge Instructions (Addendum)
 We are unable to accept/test your urine as we cannot obtain a clean specimen in office. Would recommend ER or PCP for in/out cath of urine, further evaluation of abdominal pain(cramping/spasm).

## 2024-08-10 NOTE — ED Notes (Signed)
 Patient is being discharged from the Urgent Care and sent to the Emergency Department via pv . Per Rilla, patient is in need of higher level of care due to abdominal pain. Patient is aware and verbalizes understanding of plan of care. Vitals:   08/10/24 1450  BP: 137/86  Pulse: (!) 54  Resp: 16  Temp: 98.5 F (36.9 C)  SpO2: 97%

## 2024-08-10 NOTE — ED Triage Notes (Signed)
 Pt c/o constipation and Knots in stomach  Pt states that his stomach would spasm when laying flat  Pt was seen here 1 week ago for a UTI  Pt states that while on antibiotics, his stomach pain got better.  Pt states that when he stopped the antibiotic, his bowels stopped moving and he is now having pain and discomfort.   Pt asks us  to check his urine again.

## 2024-08-11 ENCOUNTER — Ambulatory Visit (INDEPENDENT_AMBULATORY_CARE_PROVIDER_SITE_OTHER): Admitting: Internal Medicine

## 2024-08-11 VITALS — BP 110/68 | HR 60 | Resp 16

## 2024-08-11 DIAGNOSIS — E1069 Type 1 diabetes mellitus with other specified complication: Secondary | ICD-10-CM | POA: Diagnosis not present

## 2024-08-11 DIAGNOSIS — E871 Hypo-osmolality and hyponatremia: Secondary | ICD-10-CM | POA: Diagnosis not present

## 2024-08-11 DIAGNOSIS — G903 Multi-system degeneration of the autonomic nervous system: Secondary | ICD-10-CM | POA: Diagnosis not present

## 2024-08-11 DIAGNOSIS — K219 Gastro-esophageal reflux disease without esophagitis: Secondary | ICD-10-CM | POA: Diagnosis not present

## 2024-08-11 DIAGNOSIS — G8220 Paraplegia, unspecified: Secondary | ICD-10-CM | POA: Diagnosis not present

## 2024-08-11 DIAGNOSIS — R109 Unspecified abdominal pain: Secondary | ICD-10-CM

## 2024-08-11 DIAGNOSIS — Z87440 Personal history of urinary (tract) infections: Secondary | ICD-10-CM | POA: Diagnosis not present

## 2024-08-11 DIAGNOSIS — F1029 Alcohol dependence with unspecified alcohol-induced disorder: Secondary | ICD-10-CM

## 2024-08-11 DIAGNOSIS — D62 Acute posthemorrhagic anemia: Secondary | ICD-10-CM | POA: Diagnosis not present

## 2024-08-11 DIAGNOSIS — I5032 Chronic diastolic (congestive) heart failure: Secondary | ICD-10-CM | POA: Diagnosis not present
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
## 2024-08-12 ENCOUNTER — Encounter: Payer: Self-pay | Admitting: Internal Medicine

## 2024-08-12 ENCOUNTER — Ambulatory Visit: Payer: Self-pay | Admitting: Internal Medicine

## 2024-08-12 LAB — HEPATIC FUNCTION PANEL
ALT: 16 IU/L (ref 0–44)
AST: 24 IU/L (ref 0–40)
Albumin: 4.1 g/dL (ref 3.8–4.9)
Alkaline Phosphatase: 92 IU/L (ref 44–121)
Bilirubin Total: 0.4 mg/dL (ref 0.0–1.2)
Bilirubin, Direct: 0.18 mg/dL (ref 0.00–0.40)
Total Protein: 6.1 g/dL (ref 6.0–8.5)

## 2024-08-12 LAB — CBC WITH DIFFERENTIAL/PLATELET
Basophils Absolute: 0.1 x10E3/uL (ref 0.0–0.2)
Basos: 1 %
EOS (ABSOLUTE): 0.1 x10E3/uL (ref 0.0–0.4)
Eos: 1 %
Hematocrit: 36.2 % — ABNORMAL LOW (ref 37.5–51.0)
Hemoglobin: 11.8 g/dL — ABNORMAL LOW (ref 13.0–17.7)
Immature Grans (Abs): 0 x10E3/uL (ref 0.0–0.1)
Immature Granulocytes: 0 %
Lymphocytes Absolute: 1.1 x10E3/uL (ref 0.7–3.1)
Lymphs: 19 %
MCH: 32.2 pg (ref 26.6–33.0)
MCHC: 32.6 g/dL (ref 31.5–35.7)
MCV: 99 fL — ABNORMAL HIGH (ref 79–97)
Monocytes Absolute: 0.5 x10E3/uL (ref 0.1–0.9)
Monocytes: 8 %
Neutrophils Absolute: 4 x10E3/uL (ref 1.4–7.0)
Neutrophils: 71 %
Platelets: 197 x10E3/uL (ref 150–450)
RBC: 3.67 x10E6/uL — ABNORMAL LOW (ref 4.14–5.80)
RDW: 12.2 % (ref 11.6–15.4)
WBC: 5.7 x10E3/uL (ref 3.4–10.8)

## 2024-08-12 LAB — BASIC METABOLIC PANEL WITH GFR
BUN/Creatinine Ratio: 19 (ref 9–20)
BUN: 17 mg/dL (ref 6–24)
CO2: 25 mmol/L (ref 20–29)
Calcium: 9 mg/dL (ref 8.7–10.2)
Chloride: 97 mmol/L (ref 96–106)
Creatinine, Ser: 0.9 mg/dL (ref 0.76–1.27)
Glucose: 91 mg/dL (ref 70–99)
Potassium: 5 mmol/L (ref 3.5–5.2)
Sodium: 133 mmol/L — ABNORMAL LOW (ref 134–144)
eGFR: 101 mL/min/1.73 (ref >59)

## 2024-08-12 LAB — LIPID PANEL
Chol/HDL Ratio: 2 ratio (ref 0.0–5.0)
Cholesterol, Total: 163 mg/dL (ref 100–199)
HDL: 82 mg/dL (ref >39)
LDL Chol Calc (NIH): 74 mg/dL (ref 0–99)
Triglycerides: 27 mg/dL (ref 0–149)
VLDL Cholesterol Cal: 7 mg/dL (ref 5–40)

## 2024-08-12 LAB — TSH: TSH: 1.14 u[IU]/mL (ref 0.450–4.500)

## 2024-08-12 LAB — HEMOGLOBIN A1C
Est. average glucose Bld gHb Est-mCnc: 114 mg/dL
Hgb A1c MFr Bld: 5.6 % (ref 4.8–5.6)

## 2024-08-12 LAB — MAGNESIUM: Magnesium: 1.9 mg/dL (ref 1.6–2.3)

## 2024-08-12 LAB — VITAMIN B12: Vitamin B-12: 834 pg/mL (ref 232–1245)

## 2024-08-12 LAB — FERRITIN: Ferritin: 63 ng/mL (ref 30–400)

## 2024-08-12 NOTE — Assessment & Plan Note (Signed)
 Has seen cardiology. No evidence of volume overload on exam. Check metabolic panel.

## 2024-08-12 NOTE — Assessment & Plan Note (Signed)
 Followed by urology. Recently treated as outlined. Denies symptoms c/w UTI.

## 2024-08-12 NOTE — Assessment & Plan Note (Signed)
 Unclear etiology. Present for several weeks. Notices when lying down. Not tender to palpation. Abdomen soft. Recent problems with constipation. Manually removed large amount of stool today. Uses condom cath - draining well. Does not have symptoms c/w UTI. Eating. No vomiting. Will check labs, including metabolic panel, cbc and liver panel. Also check KUB - to evaluate for increased stool, etc. Keep bowels moving.

## 2024-08-12 NOTE — Assessment & Plan Note (Signed)
 Has had issues with low sodium.  Has had extensive w/up previously. Discussed the need to decrease alcohol intake. Recheck sodium today.

## 2024-08-12 NOTE — Assessment & Plan Note (Signed)
 Seeing Dr Damian. Insulin  pump.  Reports sugars ok. Follow A1c.

## 2024-08-12 NOTE — Assessment & Plan Note (Signed)
 Has been followed by cardiology. Appears to be stable.

## 2024-08-12 NOTE — Assessment & Plan Note (Signed)
No upper symptoms reported.  Prilosec.  

## 2024-08-12 NOTE — Assessment & Plan Note (Signed)
 Discussed alcohol intake and recommend to decreased alcohol intake.

## 2024-08-12 NOTE — Assessment & Plan Note (Signed)
 Independent of ADLs.

## 2024-08-12 NOTE — Assessment & Plan Note (Signed)
 Check cbc with labs today. Colonoscopy 2023 - as outlined.

## 2024-08-12 NOTE — Assessment & Plan Note (Signed)
Check magnesium level today. 

## 2024-08-13 LAB — URINE CULTURE: Culture: >=100000 — AB

## 2024-08-14 ENCOUNTER — Ambulatory Visit
Admission: RE | Admit: 2024-08-14 | Discharge: 2024-08-14 | Disposition: A | Discharge status: Home / Self Care | Source: Ambulatory Visit | Attending: Internal Medicine | Admitting: Internal Medicine

## 2024-08-14 ENCOUNTER — Ambulatory Visit
Admission: RE | Admit: 2024-08-14 | Discharge: 2024-08-14 | Disposition: A | Discharge status: Home / Self Care | Attending: Internal Medicine | Admitting: Internal Medicine

## 2024-08-14 DIAGNOSIS — R195 Other fecal abnormalities: Secondary | ICD-10-CM | POA: Diagnosis not present

## 2024-08-14 DIAGNOSIS — R109 Unspecified abdominal pain: Secondary | ICD-10-CM | POA: Insufficient documentation

## 2024-08-15 ENCOUNTER — Encounter: Payer: Self-pay | Admitting: Emergency Medicine

## 2024-08-15 ENCOUNTER — Ambulatory Visit
Admission: EM | Admit: 2024-08-15 | Discharge: 2024-08-15 | Disposition: A | Discharge status: Home / Self Care | Attending: Emergency Medicine | Admitting: Emergency Medicine

## 2024-08-15 DIAGNOSIS — R300 Dysuria: Secondary | ICD-10-CM | POA: Insufficient documentation

## 2024-08-15 LAB — POCT URINE DIPSTICK
Bilirubin, UA: NEGATIVE
Blood, UA: NEGATIVE
Glucose, UA: NEGATIVE mg/dL
Ketones, POC UA: NEGATIVE mg/dL
Nitrite, UA: NEGATIVE
Spec Grav, UA: 1.02 (ref 1.010–1.025)
Urobilinogen, UA: 0.2 U/dL
pH, UA: 5.5 (ref 5.0–8.0)

## 2024-08-15 MED ORDER — SULFAMETHOXAZOLE-TRIMETHOPRIM 800-160 MG PO TABS: 1.0000 | ORAL_TABLET | Freq: Two times a day (BID) | ORAL | 0 refills | Status: AC

## 2024-08-15 MED ORDER — EPINEPHRINE 0.3 MG/0.3ML IJ SOAJ: 0.3000 mg | INTRAMUSCULAR | 0 refills | Status: DC | PRN

## 2024-08-15 NOTE — ED Triage Notes (Signed)
 Patient reports painful urination x 1 month. Patient seen at [facility] at for same 08/10/24

## 2024-08-15 NOTE — Discharge Instructions (Signed)
 Your urinalysis shows [name] blood cells but did not show  bacteria, your urine will be sent to the lab to determine exactly which bacteria is present, if any changes need to be made to your medications you will be notified  Begin use of Bactrim  twice daily for 7 days, take dosage of Benadryl  with this  At any point if you begin to have tongue swelling, throat swelling or difficulty breathing administer EpiPen  and then go to the emergency department  You may use over-the-counter Azo to help minimize your symptoms until antibiotic removes bacteria, this medication will turn your urine orange  Increase your fluid intake through use of water  As always practice good hygiene, wiping front to back and avoidance of scented vaginal products to prevent further irritation  If symptoms continue to persist after use of medication or recur please follow-up with urgent care or your primary doctor as needed

## 2024-08-17 ENCOUNTER — Ambulatory Visit (HOSPITAL_COMMUNITY): Payer: Self-pay

## 2024-08-17 LAB — URINE CULTURE: Culture: >=100000 — AB

## 2024-08-18 ENCOUNTER — Telehealth (HOSPITAL_COMMUNITY): Payer: Self-pay

## 2024-08-18 NOTE — Telephone Encounter (Signed)
 Pt called regarding the following, which was sent as a message to A. White, NP,: pt's urine culture came back sensitive to the bactrim  he's on. I spoke with him earlier and told and he said ok. He just called back saying he has a place on his tongue that's swollen that MAY be from where he passed out the other day and bit it, but given his listed allergies, he wanted to be sure. He is NOT having any other lip or throat swelling, no SOB/difficulty swallowing. The culture was limited on oral abx except maybe possibly a cephalosporin?? He was asking if it's possible to switch to a different oral, but he doesn't want to go to the ER for any IV abx.  Per NP, at best we can try cefdinir  500 mg every 6 hours for 5 days as alternative since some cephalosporins are sensitive while others are resistant.  Discussed with pt. Pt states due to the uncertainty of taking the Cefdinir , he will continue with the Bactrim . ER precautions given. Verbalized understanding.
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
## 2024-08-21 LAB — MISC LABCORP TEST (SEND OUT)
LabCorp test name: 823811
Labcorp test code: 823811

## 2024-08-22 ENCOUNTER — Ambulatory Visit: Admitting: Physician Assistant

## 2024-08-23 ENCOUNTER — Other Ambulatory Visit: Payer: Self-pay | Admitting: Cardiovascular Disease

## 2024-08-26 ENCOUNTER — Ambulatory Visit
Admission: EM | Admit: 2024-08-26 | Discharge: 2024-08-26 | Disposition: A | Discharge status: Home / Self Care | Attending: Emergency Medicine | Admitting: Emergency Medicine

## 2024-08-26 DIAGNOSIS — R300 Dysuria: Secondary | ICD-10-CM | POA: Diagnosis not present

## 2024-08-26 DIAGNOSIS — K121 Other forms of stomatitis: Secondary | ICD-10-CM | POA: Insufficient documentation

## 2024-08-26 LAB — POCT URINE DIPSTICK
Bilirubin, UA: NEGATIVE
Glucose, UA: NEGATIVE mg/dL
Ketones, POC UA: NEGATIVE mg/dL
Nitrite, UA: NEGATIVE
Protein Ur, POC: 30 mg/dL — AB
Spec Grav, UA: 1.025 (ref 1.010–1.025)
Urobilinogen, UA: 0.2 U/dL
pH, UA: 6 (ref 5.0–8.0)

## 2024-08-26 MED ORDER — LIDOCAINE VISCOUS HCL 2 % MT SOLN: OROMUCOSAL | 0 refills | Status: DC

## 2024-08-26 MED ORDER — SULFAMETHOXAZOLE-TRIMETHOPRIM 800-160 MG PO TABS: 1.0000 | ORAL_TABLET | Freq: Two times a day (BID) | ORAL | 0 refills | Status: AC
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
## 2024-08-26 NOTE — Discharge Instructions (Addendum)
 Take the Bactrim  as directed.  Follow-up with your urologist next week.  Go to the emergency department if you have worsening symptoms.    Use the Magic mouthwash as directed.  Follow-up with your primary care provider.

## 2024-08-26 NOTE — ED Triage Notes (Signed)
 Patient to Urgent Care with complaints of painful urination/ stomach cramps. Constipation.   Symptoms x1 month. Also w/ canker sore on the left side of his tongue.   Denies any known fevers (reports episodes of HTN and low BP).

## 2024-08-29 ENCOUNTER — Encounter: Payer: Self-pay | Admitting: Internal Medicine

## 2024-08-29 DIAGNOSIS — R109 Unspecified abdominal pain: Secondary | ICD-10-CM

## 2024-08-29 DIAGNOSIS — R1084 Generalized abdominal pain: Secondary | ICD-10-CM

## 2024-08-29 NOTE — Telephone Encounter (Signed)
 If he is having persistent problems, needs GI evaluation. Let him know that the physicians at [facility] have joined [facility]. Confirm he is agreeable for referral. Also, need to keep bowels moving. Also, if persistent abdominal pain, can see if agreeable to CT scan abd/pelvis.

## 2024-08-29 NOTE — Telephone Encounter (Signed)
 Spoke with pt and he stated that he saw Dr. Glendia about 3 weeks ago and they discussed the abdominal pain he has been having. Pt stated that he was advised to start taking magnesium . Pt stated that he has been taking it for 2 weeks and has not seen a change it his abdominal pain. Pt stated that some days are worse than others but the pain is constant. Pt stated that it is like he feels constipated but does not feel bloated. Pt stated that he is having bowel movements about every 3rd day. Pt would like to know what the next steps are.

## 2024-08-30 ENCOUNTER — Ambulatory Visit (HOSPITAL_COMMUNITY): Payer: Self-pay

## 2024-08-30 LAB — URINE CULTURE: Culture: >=100000 — AB

## 2024-09-01 NOTE — Telephone Encounter (Signed)
Order placed for CT abd/pelvis

## 2024-09-01 NOTE — Telephone Encounter (Signed)
 I will schedule the CT, but if he is having acute worsening, needs to go ahead and be evaluated. Please send back to me to order CT

## 2024-09-05 ENCOUNTER — Ambulatory Visit: Admitting: Physician Assistant

## 2024-09-05 ENCOUNTER — Other Ambulatory Visit: Payer: Self-pay

## 2024-09-05 ENCOUNTER — Encounter: Payer: Self-pay | Admitting: Internal Medicine

## 2024-09-05 MED ORDER — LIDOCAINE VISCOUS HCL 2 % MT SOLN: OROMUCOSAL | 0 refills | Status: DC

## 2024-09-06 ENCOUNTER — Encounter: Payer: Self-pay | Admitting: Emergency Medicine

## 2024-09-06 ENCOUNTER — Encounter: Payer: Self-pay | Admitting: Internal Medicine

## 2024-09-06 ENCOUNTER — Ambulatory Visit
Admission: EM | Admit: 2024-09-06 | Discharge: 2024-09-06 | Discharge status: Against Medical Advice | Attending: Internal Medicine | Admitting: Internal Medicine

## 2024-09-06 DIAGNOSIS — I10 Essential (primary) hypertension: Secondary | ICD-10-CM | POA: Insufficient documentation

## 2024-09-06 DIAGNOSIS — S50311A Abrasion of right elbow, initial encounter: Secondary | ICD-10-CM | POA: Insufficient documentation

## 2024-09-06 DIAGNOSIS — N390 Urinary tract infection, site not specified: Secondary | ICD-10-CM | POA: Diagnosis not present

## 2024-09-06 DIAGNOSIS — S069X9A Unspecified intracranial injury with loss of consciousness of unspecified duration, initial encounter: Secondary | ICD-10-CM | POA: Diagnosis not present

## 2024-09-06 DIAGNOSIS — R55 Syncope and collapse: Secondary | ICD-10-CM

## 2024-09-06 LAB — POCT URINE DIPSTICK
Bilirubin, UA: NEGATIVE
Blood, UA: NEGATIVE
Glucose, UA: NEGATIVE mg/dL
Ketones, POC UA: NEGATIVE mg/dL
Nitrite, UA: NEGATIVE
POC PROTEIN,UA: NEGATIVE
Spec Grav, UA: <=1.005 — AB (ref 1.010–1.025)
Urobilinogen, UA: 0.2 U/dL
pH, UA: 5 (ref 5.0–8.0)

## 2024-09-06 MED ORDER — MUPIROCIN 2 % EX OINT: 1.0000 | TOPICAL_OINTMENT | Freq: Two times a day (BID) | CUTANEOUS | 0 refills | Status: DC

## 2024-09-06 MED ORDER — NYSTATIN 100000 UNIT/ML MT SUSP: 5.0000 mL | Freq: Three times a day (TID) | OROMUCOSAL | 0 refills | Status: DC

## 2024-09-06 MED ORDER — NYSTATIN 100000 UNIT/ML MT SUSP: 5.0000 mL | Freq: Four times a day (QID) | OROMUCOSAL | 0 refills | Status: DC

## 2024-09-06 MED ORDER — LIDOCAINE VISCOUS HCL 2 % MT SOLN: OROMUCOSAL | 0 refills | Status: DC

## 2024-09-06 NOTE — ED Notes (Signed)
Patient left AMA and signed AMA form
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
## 2024-09-06 NOTE — Addendum Note (Signed)
 Addended by: GLENDIA ALLENA RAMAN on: 09/06/2024 04:08 PM   Modules accepted: Orders

## 2024-09-06 NOTE — Telephone Encounter (Signed)
 I have sent in rx for nystatin suspension. Also, let pt know that referral team has been contacted (per previous message) and someone should be calling him about CT scan. Let us  know if not scheduled.

## 2024-09-06 NOTE — ED Triage Notes (Signed)
 Patient reports sores in mouth on left and right side. Patient states the one on the left has never healed since 08/26/24. Patient also reports a fall on on 08/27/24 patient has abrasion right elbow area. Patient also complains painful urination x 1 month.  Rates pain 4/10. Patient has been using magic mouth wash with some relief.

## 2024-09-07 ENCOUNTER — Other Ambulatory Visit: Payer: Self-pay | Admitting: Internal Medicine

## 2024-09-07 NOTE — Telephone Encounter (Signed)
 Called patient, unable to leave message please relay message below.

## 2024-09-07 NOTE — Telephone Encounter (Signed)
 Can we please get his abdominal CT scheduled that was ordered on 9/26?

## 2024-09-07 NOTE — Telephone Encounter (Signed)
 Please call him and let him know that I have called GI for earlier appt and that we have contacted referral about scheduling CT. Let me know if does not here. Any acute issues will need to be evaluated. Also, it appears he was seen in urgent care for syncopal episode. He needs to be evaluated and needs further w/up to determine etiology.  Need to confirm doing ok.

## 2024-09-07 NOTE — Telephone Encounter (Signed)
 Called patient. He was messaging to let you know that GI scheduled him for 01/30/25 with Dr Therisa and he cannot wait that long to be seen. Also wanted to make me aware that CT scan still has not been scheduled. Order is still showing pended- I have sent another high priority message to the referral team.

## 2024-09-07 NOTE — Telephone Encounter (Signed)
 Called patient , unable to leave message

## 2024-09-08 NOTE — Telephone Encounter (Signed)
 Rx ok'd for baclofen .

## 2024-09-08 NOTE — Telephone Encounter (Signed)
 Called and spoke with patient today. Patient says he is not going to the ED to be evaluated but he is agreeable to cardiology referral. He is aware that we have called GI to get sooner appt and number was provided to patient to call and schedule his CT scan. Pt was instructed to call with update next week.

## 2024-09-09 LAB — URINE CULTURE: Culture: >=100000 — AB

## 2024-09-09 NOTE — Telephone Encounter (Signed)
 Order has been placed for cardiology referral. If his CT scan is not scheduled, please give me the information he was given so that I can call and schedule. Also, needs a f/u appt with me as well.

## 2024-09-09 NOTE — Addendum Note (Signed)
 Addended by: GLENDIA ALLENA RAMAN on: 09/09/2024 11:39 AM   Modules accepted: Orders

## 2024-09-11 ENCOUNTER — Ambulatory Visit (HOSPITAL_COMMUNITY): Payer: Self-pay | Admitting: Internal Medicine

## 2024-09-11 ENCOUNTER — Telehealth: Payer: Self-pay | Admitting: Internal Medicine

## 2024-09-11 NOTE — Telephone Encounter (Signed)
Patient is aware of date and time.  

## 2024-09-11 NOTE — Telephone Encounter (Signed)
 Please call [name] and notify him that I called and scheduled him for CT abd/pelvic - tomorrow. He needs to be at [facility] 11:15 and scan is scheduled for 1:15. He needs to arrive early to drink oral contrast.

## 2024-09-12 ENCOUNTER — Ambulatory Visit
Admission: RE | Admit: 2024-09-12 | Discharge: 2024-09-12 | Disposition: A | Discharge status: Home / Self Care | Source: Ambulatory Visit | Attending: Internal Medicine | Admitting: Internal Medicine

## 2024-09-12 ENCOUNTER — Encounter: Attending: Physician Assistant | Admitting: Physician Assistant

## 2024-09-12 DIAGNOSIS — I5042 Chronic combined systolic (congestive) and diastolic (congestive) heart failure: Secondary | ICD-10-CM | POA: Insufficient documentation

## 2024-09-12 DIAGNOSIS — G8220 Paraplegia, unspecified: Secondary | ICD-10-CM | POA: Diagnosis not present

## 2024-09-12 DIAGNOSIS — N200 Calculus of kidney: Secondary | ICD-10-CM | POA: Diagnosis not present

## 2024-09-12 DIAGNOSIS — R1084 Generalized abdominal pain: Secondary | ICD-10-CM | POA: Diagnosis not present

## 2024-09-12 DIAGNOSIS — L89153 Pressure ulcer of sacral region, stage 3: Secondary | ICD-10-CM | POA: Diagnosis not present

## 2024-09-12 DIAGNOSIS — T23261A Burn of second degree of back of right hand, initial encounter: Secondary | ICD-10-CM | POA: Insufficient documentation

## 2024-09-12 DIAGNOSIS — E10622 Type 1 diabetes mellitus with other skin ulcer: Secondary | ICD-10-CM | POA: Diagnosis present

## 2024-09-12 DIAGNOSIS — R109 Unspecified abdominal pain: Secondary | ICD-10-CM | POA: Diagnosis not present
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
## 2024-09-14 ENCOUNTER — Encounter: Payer: Self-pay | Admitting: Internal Medicine

## 2024-09-14 NOTE — Telephone Encounter (Signed)
 As per our discussion, if he is having no symptoms, then we do not need to treat. He has the catheter and with his history (neurogenic bladder) can always find bacteria in his urine. Still waiting on CT results.

## 2024-09-14 NOTE — Telephone Encounter (Signed)
Called patient. Unable to leave message.

## 2024-09-14 NOTE — Telephone Encounter (Signed)
 See other message,

## 2024-09-14 NOTE — Telephone Encounter (Signed)
 Per our discussion, called pt for symptom update. He is not having any urinary symptoms. No fever, chills, body aches, etc. Explained to pt that he may not need treatment and we are waiting on CT scan results regarding his abdominal symptoms. Have already reached out to the read room to have it moved to STAT list.

## 2024-09-14 NOTE — Telephone Encounter (Signed)
Patient aware of message below.

## 2024-09-15 ENCOUNTER — Ambulatory Visit

## 2024-09-15 ENCOUNTER — Encounter: Payer: Self-pay | Admitting: Physician Assistant

## 2024-09-15 ENCOUNTER — Ambulatory Visit: Attending: Physician Assistant | Admitting: Physician Assistant

## 2024-09-15 VITALS — BP 143/86 | HR 57 | Ht 72.0 in | Wt 170.0 lb

## 2024-09-15 DIAGNOSIS — I5032 Chronic diastolic (congestive) heart failure: Secondary | ICD-10-CM | POA: Diagnosis not present

## 2024-09-15 DIAGNOSIS — R55 Syncope and collapse: Secondary | ICD-10-CM | POA: Diagnosis not present

## 2024-09-15 DIAGNOSIS — R569 Unspecified convulsions: Secondary | ICD-10-CM

## 2024-09-15 DIAGNOSIS — Z8673 Personal history of transient ischemic attack (TIA), and cerebral infarction without residual deficits: Secondary | ICD-10-CM | POA: Diagnosis not present

## 2024-09-15 DIAGNOSIS — R0989 Other specified symptoms and signs involving the circulatory and respiratory systems: Secondary | ICD-10-CM | POA: Diagnosis not present

## 2024-09-15 DIAGNOSIS — I700 Atherosclerosis of aorta: Secondary | ICD-10-CM | POA: Diagnosis not present

## 2024-09-15 DIAGNOSIS — G903 Multi-system degeneration of the autonomic nervous system: Secondary | ICD-10-CM

## 2024-09-15 DIAGNOSIS — E785 Hyperlipidemia, unspecified: Secondary | ICD-10-CM | POA: Diagnosis not present

## 2024-09-15 NOTE — Telephone Encounter (Signed)
 Called [name]. Discussed current symptoms. He feels his urinary concerns (infection) have resolved. Hold on abx. Discussed CT results. Being followed at the wound clinic.

## 2024-09-15 NOTE — Patient Instructions (Signed)
 Medication Instructions:  Your physician recommends that you continue on your current medications as directed. Please refer to the Current Medication list given to you today.   *If you need a refill on your cardiac medications before your next appointment, please call your pharmacy*  Lab Work: None ordered at this time  If you have labs (blood work) drawn today and your tests are completely normal, you will receive your results only by: MyChart Message (if you have MyChart) OR A paper copy in the mail If you have any lab test that is abnormal or we need to change your treatment, we will call you to review the results.  Testing/Procedures: ZIO AT Long term monitor-Live Telemetry  Your physician has requested you wear a ZIO patch monitor for 14 days.  This is a single patch monitor. [employer] supplies one patch monitor per enrollment.  Additional stickers are not available.  Please do not apply patch if you will be having a Nuclear Stress Test, Echocardiogram, Cardiac CT, MRI, or Chest Xray during the period you would be wearing the monitor.  The patch cannot be worn during these tests.  You cannot remove and re-apply the ZIO AT patch monitor.  Your ZIO patch monitor will be mailed 3 day USPS to your address on file. It may take 3-5 days to receive your monitor after you have been enrolled.  Once you have received your monitor, please review the enclosed instructions. Your monitor has already been registered assigning a specific monitor serial number to you.   Billing and Patient Assistance Program information  Meredeth has been supplied with any insurance information on record for billing.  [employer] offers a sliding scale Patient Assistance Program for patients without insurance, or whose insurance does not completely cover the cost of the ZIO patch monitor.  You must apply for the Patient Assistance Program to qualify for the discounted rate.  To apply, call [employer] at [phone], select option 4,  select option 2 , ask to apply for the Patient Assistance Program, (you can request an interpreter if needed).  [employer] will ask your household income and how many people are in your household.  [employer] will quote your out-of-pocket cost based on this information. They will also be able to set up a 12 month interest free payment plan if needed.  Applying the monitor   Shave hair from upper left chest.  Hold the abrader disc by orange tab. Rub the abrader in 40 strokes over left upper chest as indicated in your monitor instructions.  Clean area with 4 enclosed alcohol pads. Use all pads to ensure the area is cleaned thoroughly. Let dry.  Apply patch as indicated in monitor instructions. Patch will be placed under collarbone on left side of chest with arrow pointing upward.  Rub patch adhesive wings for 2 minutes. Remove the white label marked 1. Remove the white label marked 2. Rub patch adhesive wings for 2 additional minutes.  While looking in a mirror, press and release button in center of patch. A small green light will flash 3-4 times.  This will be your only indicator that the monitor has been turned on.   Do not shower for the first 24 hours.  You may shower after the first 24 hours.  Press the button if you feel a symptom.  You will hear a small click.  Record Date, Time and Symptom in the Patient Log.   Starting the Gateway  In your kit there is a Retail banker  the size of a cellphone.  This is Buyer, retail. It transmits all your recorded data to [facility].  This box must always stay within 10 feet of you.  Open the box and push the * button.  There will be a light that blinks orange and then green a few times.  When the light stops blinking, the Gateway is connected to the ZIO patch. Call [employer] at [phone] to confirm your monitor is transmitting.  Returning your monitor  Remove your patch and place it inside the Gateway.  In the lower half of the Gateway box there is a white  bag with prepaid postage on it.  Place Gateway in bag and seal.  Mail package back to [place] as soon as possible.  Your physician should have your final report approximately 7-14 days after you have mailed back your monitor. Call [facility] Customer Care at [phone] if you have questions regarding your ZIO AT patch monitor.   Call them immediately if you see an orange light blinking on your monitor.  If your monitor falls off in less than 4 days, contact our Monitor department at [phone]. If your monitor becomes loose or falls off after 4 days call [employer] at [phone] for suggestions on securing your monitor.  Your physician has requested that you have an echocardiogram. Echocardiography is a painless test that uses sound waves to create images of your heart. It provides your doctor with information about the size and shape of your heart and how well your heart's chambers and valves are working.   You may receive an ultrasound enhancing agent through an IV if needed to better visualize your heart during the echo. This procedure takes approximately one hour.  There are no restrictions for this procedure.  This will take place at [address] [facility], [place] [address]  Please note: We ask at that you not bring children with you during ultrasound (echo/ vascular) testing. Due to room size and safety concerns, children are not allowed in the ultrasound rooms during exams. Our front office staff cannot provide observation of children in our lobby area while testing is being conducted. An adult accompanying a patient to their appointment will only be allowed in the ultrasound room at the discretion of the ultrasound technician under special circumstances. We apologize for any inconvenience.   Follow-Up: At [facility], you and your health needs are our priority.  As part of our continuing mission to provide you with exceptional heart care,  our providers are all part of one team.  This team includes your primary Cardiologist (physician) and Advanced Practice Providers or APPs (Physician Assistants and Nurse Practitioners) who all work together to provide you with the care you need, when you need it.  Your next appointment:   3 month(s)  Provider:   You may see Timothy Gollan, MD

## 2024-09-18 ENCOUNTER — Ambulatory Visit: Payer: Self-pay | Admitting: Internal Medicine

## 2024-09-19 ENCOUNTER — Encounter: Payer: Self-pay | Admitting: Internal Medicine

## 2024-09-20 NOTE — Telephone Encounter (Signed)
 Noted

## 2024-09-25 DIAGNOSIS — E109 Type 1 diabetes mellitus without complications: Secondary | ICD-10-CM | POA: Diagnosis not present

## 2024-09-26 DIAGNOSIS — Z9641 Presence of insulin pump (external) (internal): Secondary | ICD-10-CM | POA: Diagnosis not present

## 2024-09-26 DIAGNOSIS — R1084 Generalized abdominal pain: Secondary | ICD-10-CM | POA: Diagnosis not present

## 2024-09-26 DIAGNOSIS — E109 Type 1 diabetes mellitus without complications: Secondary | ICD-10-CM | POA: Diagnosis not present

## 2024-09-27 DIAGNOSIS — Z791 Long term (current) use of non-steroidal anti-inflammatories (NSAID): Secondary | ICD-10-CM | POA: Diagnosis not present

## 2024-09-27 DIAGNOSIS — R1013 Epigastric pain: Secondary | ICD-10-CM | POA: Diagnosis not present

## 2024-10-10 ENCOUNTER — Encounter: Attending: Physician Assistant | Admitting: Physician Assistant

## 2024-10-10 ENCOUNTER — Telehealth: Payer: Self-pay

## 2024-10-10 NOTE — Telephone Encounter (Signed)
 Received paperwork from [facility] health placed in folder for review

## 2024-10-11 NOTE — Telephone Encounter (Signed)
 Form faxed and sent to scan

## 2024-10-11 NOTE — Telephone Encounter (Signed)
 Please confirm he requested. Signed and placed in box.

## 2024-10-19 ENCOUNTER — Encounter: Payer: Self-pay | Admitting: Pharmacist

## 2024-10-19 NOTE — Progress Notes (Signed)
 Pharmacy Quality Measure Review  This patient is appearing on a report for being at risk of failing the adherence measure for cholesterol (statin) medications this calendar year.   Medication: rosuvastatin  5 mg Last fill date: 07/22/24 for 90 day supply  Insurance report was not up to date. No action needed at this time.  Medication has been refilled as of 10/19/24 x90ds.  Next refill due 2026.

## 2024-10-25 NOTE — Telephone Encounter (Signed)
 open in error

## 2024-10-31 ENCOUNTER — Ambulatory Visit: Payer: Self-pay | Admitting: Physician Assistant

## 2024-10-31 DIAGNOSIS — R55 Syncope and collapse: Secondary | ICD-10-CM | POA: Diagnosis not present

## 2024-11-01 ENCOUNTER — Ambulatory Visit: Attending: Physician Assistant

## 2024-11-06 DEATH — deceased

## 2024-11-07 ENCOUNTER — Encounter: Admitting: Physician Assistant

## 2024-11-07 ENCOUNTER — Encounter: Payer: Self-pay | Admitting: Internal Medicine

## 2024-11-07 NOTE — Progress Notes (Signed)
 Pharmacy Quality Measure Review  This patient is appearing on a report for being at risk of failing the adherence measure for cholesterol (statin) medications this calendar year.   Medication: Rosuvastatin  5mg  Last fill date: 11/13 for 84 day supply  Insurance report was not up to date. No action needed at this time.   Angela Baalmann, PharmD [facility] Pharmacist

## 2025-02-06 ENCOUNTER — Ambulatory Visit: Admitting: Urology

## 2025-05-28 ENCOUNTER — Ambulatory Visit
